# Patient Record
Sex: Female | Born: 1945
Health system: Southern US, Community
[De-identification: ages and names within clinical notes are randomized; demographics above are authoritative.]

## PROBLEM LIST (undated history)

## (undated) DIAGNOSIS — J189 Pneumonia, unspecified organism: Secondary | ICD-10-CM

## (undated) DIAGNOSIS — M542 Cervicalgia: Secondary | ICD-10-CM

## (undated) DIAGNOSIS — R140 Abdominal distension (gaseous): Secondary | ICD-10-CM

## (undated) DIAGNOSIS — IMO0001 Reserved for inherently not codable concepts without codable children: Secondary | ICD-10-CM

## (undated) DIAGNOSIS — Z170 Estrogen receptor positive status [ER+]: Secondary | ICD-10-CM

## (undated) DIAGNOSIS — M1990 Unspecified osteoarthritis, unspecified site: Secondary | ICD-10-CM

## (undated) DIAGNOSIS — E119 Type 2 diabetes mellitus without complications: Secondary | ICD-10-CM

## (undated) DIAGNOSIS — R519 Headache, unspecified: Secondary | ICD-10-CM

## (undated) DIAGNOSIS — K859 Acute pancreatitis without necrosis or infection, unspecified: Secondary | ICD-10-CM

## (undated) DIAGNOSIS — M545 Low back pain: Secondary | ICD-10-CM

## (undated) DIAGNOSIS — G8929 Other chronic pain: Secondary | ICD-10-CM

## (undated) DIAGNOSIS — C50912 Malignant neoplasm of unspecified site of left female breast: Secondary | ICD-10-CM

## (undated) DIAGNOSIS — Z4659 Encounter for fitting and adjustment of other gastrointestinal appliance and device: Secondary | ICD-10-CM

## (undated) DIAGNOSIS — K311 Adult hypertrophic pyloric stenosis: Secondary | ICD-10-CM

## (undated) DIAGNOSIS — J45909 Unspecified asthma, uncomplicated: Secondary | ICD-10-CM

## (undated) DIAGNOSIS — R51 Headache: Secondary | ICD-10-CM

## (undated) DIAGNOSIS — K219 Gastro-esophageal reflux disease without esophagitis: Secondary | ICD-10-CM

## (undated) DIAGNOSIS — I10 Essential (primary) hypertension: Secondary | ICD-10-CM

## (undated) DIAGNOSIS — C50919 Malignant neoplasm of unspecified site of unspecified female breast: Secondary | ICD-10-CM

## (undated) DIAGNOSIS — N179 Acute kidney failure, unspecified: Secondary | ICD-10-CM

## (undated) DIAGNOSIS — C762 Malignant neoplasm of abdomen: Secondary | ICD-10-CM

## (undated) DIAGNOSIS — M549 Dorsalgia, unspecified: Secondary | ICD-10-CM

## (undated) DIAGNOSIS — K315 Obstruction of duodenum: Secondary | ICD-10-CM

## (undated) DIAGNOSIS — J449 Chronic obstructive pulmonary disease, unspecified: Secondary | ICD-10-CM

## (undated) DIAGNOSIS — C50812 Malignant neoplasm of overlapping sites of left female breast: Secondary | ICD-10-CM

## (undated) DIAGNOSIS — D649 Anemia, unspecified: Secondary | ICD-10-CM

## (undated) HISTORY — PX: TUBAL LIGATION: SHX77

## (undated) HISTORY — DX: Malignant neoplasm of overlapping sites of left female breast: C50.812

## (undated) HISTORY — DX: Acute pancreatitis without necrosis or infection, unspecified: K85.90

## (undated) HISTORY — DX: Cervicalgia: M54.2

## (undated) HISTORY — DX: Type 2 diabetes mellitus without complications: E11.9

## (undated) HISTORY — DX: Other chronic pain: G89.29

## (undated) HISTORY — DX: Estrogen receptor positive status (ER+): Z17.0

## (undated) HISTORY — DX: Low back pain: M54.5

## (undated) HISTORY — DX: Acute kidney failure, unspecified: N17.9

## (undated) HISTORY — DX: Essential (primary) hypertension: I10

## (undated) HISTORY — DX: Gastro-esophageal reflux disease without esophagitis: K21.9

## (undated) HISTORY — DX: Encounter for fitting and adjustment of other gastrointestinal appliance and device: Z46.59

## (undated) HISTORY — DX: Obstruction of duodenum: K31.5

## (undated) HISTORY — DX: Abdominal distension (gaseous): R14.0

## (undated) HISTORY — DX: Dorsalgia, unspecified: M54.9

## (undated) HISTORY — DX: Malignant neoplasm of unspecified site of left female breast: C50.912

## (undated) HISTORY — DX: Adult hypertrophic pyloric stenosis: K31.1

## (undated) HISTORY — PX: ABDOMINAL HYSTERECTOMY: SHX81

---

## 2007-08-28 DIAGNOSIS — C50919 Malignant neoplasm of unspecified site of unspecified female breast: Secondary | ICD-10-CM

## 2007-08-28 HISTORY — DX: Malignant neoplasm of unspecified site of unspecified female breast: C50.919

## 2013-08-27 DIAGNOSIS — J189 Pneumonia, unspecified organism: Secondary | ICD-10-CM

## 2013-08-27 HISTORY — PX: BREAST SURGERY: SHX581

## 2013-08-27 HISTORY — DX: Pneumonia, unspecified organism: J18.9

## 2013-09-22 ENCOUNTER — Ambulatory Visit: Payer: Self-pay | Admitting: Family Medicine

## 2013-10-01 ENCOUNTER — Ambulatory Visit: Payer: Self-pay | Admitting: Family Medicine

## 2014-04-08 ENCOUNTER — Inpatient Hospital Stay: Payer: Self-pay | Admitting: Internal Medicine

## 2014-04-08 LAB — CBC
HCT: 49.9 % — AB (ref 35.0–47.0)
HGB: 15.8 g/dL (ref 12.0–16.0)
MCH: 28.8 pg (ref 26.0–34.0)
MCHC: 31.6 g/dL — ABNORMAL LOW (ref 32.0–36.0)
MCV: 91 fL (ref 80–100)
Platelet: 285 10*3/uL (ref 150–440)
RBC: 5.47 10*6/uL — ABNORMAL HIGH (ref 3.80–5.20)
RDW: 15.7 % — AB (ref 11.5–14.5)
WBC: 8.9 10*3/uL (ref 3.6–11.0)

## 2014-04-08 LAB — BASIC METABOLIC PANEL
Anion Gap: 6 — ABNORMAL LOW (ref 7–16)
BUN: 5 mg/dL — ABNORMAL LOW (ref 7–18)
CALCIUM: 9.5 mg/dL (ref 8.5–10.1)
CHLORIDE: 99 mmol/L (ref 98–107)
CO2: 28 mmol/L (ref 21–32)
Creatinine: 0.42 mg/dL — ABNORMAL LOW (ref 0.60–1.30)
EGFR (African American): >60
GLUCOSE: 116 mg/dL — AB (ref 65–99)
Osmolality: 265 (ref 275–301)
POTASSIUM: 4.2 mmol/L (ref 3.5–5.1)
Sodium: 133 mmol/L — ABNORMAL LOW (ref 136–145)

## 2014-04-08 LAB — TROPONIN I: Troponin-I: 0.05 ng/mL

## 2014-04-13 LAB — CULTURE, BLOOD (SINGLE)

## 2014-12-18 NOTE — H&P (Signed)
PATIENT NAME:  Sophia Nelson, Sophia Nelson MR#:  [id number] DATE OF BIRTH:  12-26-1945  DATE OF ADMISSION:  04/08/2014    PRIMARY CARE PHYSICIAN: Dr. Clide Deutscher.   CHIEF COMPLAINT: Shortness of breath.   HISTORY OF PRESENT ILLNESS: A 69 year old African-American female with a history of COPD and hypertension, diabetes, presenting with shortness of breath. Describes 1 day duration of shortness of breath with associated wheezing and chest tightness; however, denies any fevers, chills, cough, recent sick contacts with this and decided to present to the hospital for workup and evaluation, found to be hypoxemic on arrival in the ER requiring nonrebreather to maintain SaO2 greater than 92%. Currently, she is somewhat somnolent though arousable, but having some difficulty actually answering any questions.   REVIEW OF SYSTEMS: Essentially unobtainable at this point given patient's mental status and inability to cooperate with physical exam.  PAST MEDICAL HISTORY: COPD, hypertension, diabetes, history of breast cancer.   SOCIAL HISTORY: Positive for tobacco use. No alcohol or drug use.   FAMILY HISTORY: No known cardiovascular or pulmonary disorders.   ALLERGIES: No known drug allergies.   HOME MEDICATIONS: Aspirin 81 mg p.o. q. daily,  meloxicam 7.5 mg p.o. b.i.d., tramadol 50 mg p.o. q. 6 hours as needed for pain, lisinopril 10 mg 2 tabs p.o. daily, glipizide 5 mg p.o. q. daily, Advair 100/50 mcg 1 puff b.i.d., Spiriva 2.5 mcg inhalation 2 puffs daily, Ventolin 90 mcg inhalation 1 puff as needed for shortness of breath, montelukast 10 mg p.o. q. daily, Prilosec 20 mg p.o. q. daily and Flonase one spray inhaled as needed.   PHYSICAL EXAMINATION:  VITAL SIGNS: Temperature 97.9, heart rate 112, respirations 34 on arrival, currently 24. Blood pressure 199/93. Saturation is currently 174/82, saturating 88% on room air, currently 95 on supplemental O2. Weight 70.3 kg, BMI 28.4.  GENERAL: Well-nourished, well-developed,  African American female in minimal to moderate distress given mental status and respiratory status.  HEAD: Normocephalic, atraumatic.  EYES: Pupils equal, round and reactive to light. Extraocular muscles intact. No scleral icterus.  MOUTH: Moist mucous membranes. Dentition intact. No abscess noted.  EARS, NOSE AND THROAT: Clear without exudates. No external lesions.  NECK: Supple. No thyromegaly. No nodules. No JVD.  PULMONARY: Markedly decreased breath sounds at all lung fields with scant expiratory wheeze noted, however, no frank rales or rhonchi. Difficult examination given how diminished her breath sounds are. She is tachypneic, but no use of accessory muscles.  CHEST: Nontender to palpation.  CARDIOVASCULAR: S1, S2, tachycardic. No murmurs, rubs, or gallops. There is 1+ edema to the shins bilaterally. Pedal pulses 2+ bilaterally.  GASTROINTESTINAL: Soft, nontender, nondistended. No masses. Positive bowel sounds. No hepatosplenomegaly.  MUSCULOSKELETAL: No swelling, clubbing. Positive for edema as described below.  NEUROLOGIC: Difficult to assess given the patient's current mental status. She is arousable, but does not follow commands at this time which is different from the ER physician examination.  PSYCHIATRIC: She is somnolent. Will awake to verbal commands, however, falls back asleep. Will not follow simple commands at this time. Therefore, unable to fully assess given patient's mental status and medical condition.   LABORATORY DATA: Chest x-ray performed revealing bibasilar atelectasis. CT chest performed reveals no evidence of PE. Small area of air space consolidation and atelectasis in the posterior aspect of right lobe concerning for possible pneumonia as well as mediastinal and right hilar lymphadenopathy noted. Remainder of laboratory data: Sodium 133, potassium 4.2, chloride 99, bicarbonate 28, BUN 5, creatinine 0.42. WBC 8.9, hemoglobin 15.8, platelets  of 285,000. ABG results just  returned 7.376, 75, bicarbonate 37.4.  ASSESSMENT AND PLAN: A 69 year old [place] American female with history of chronic obstructive pulmonary disease, hypertension, diabetes currently having shortness of breath.   1. Acute on chronic respiratory failure with hypoxemia secondary to chronic obstructive pulmonary disease exacerbation indicated by respiratory rate and oxygen saturation on arrival. Provide DuoNeb treatments q. 4 hours, supplemental oxygen until saturations greater than 92%. Continue with Levaquin therapy as initiated in the Emergency Department for possible pneumonia as noted on CAT scan. Add incentive spirometry. Given the ABG results, it appears that she he is a chronic CO2 retainer although slightly worse now than usual. Given her mentation we will use BiPAP and follow her mentation. Placed on Solu-Medrol 60 mg intravenous q. daily as well.  2. Chronic obstructive pulmonary disease. Continue home medications as well as the additions as stated above.  3. Diabetes. Hold p.o. agents. Add insulin sliding scale with q. 6 hour Accu-Cheks.  4. Hypertension. Continue lisinopril. 5. Venous thromboembolism prophylaxis with heparin subcutaneous.  CODE:  Patient is a full code.   TIME SPENT: 45 minutes.    ____________________________ Aaron Mose. Hower, MD dkh:JT D: 04/08/2014 22:43:38 ET T: 04/08/2014 22:53:54 ET JOB#: [id number]  cc: Aaron Mose. Hower, MD, <Dictator> DAVID Woodfin Ganja MD ELECTRONICALLY SIGNED 04/09/2014 23:05

## 2014-12-18 NOTE — Discharge Summary (Signed)
PATIENT NAME:  Sophia Nelson, CARMICAL MR#:  [id number] DATE OF BIRTH:  1945/11/24  DATE OF ADMISSION:  04/08/2014 DATE OF DISCHARGE:    ADMISSION DIAGNOSIS: Acute respiratory failure.   DISCHARGE DIAGNOSES:  1. Acute on chronic respiratory failure from acute exacerbation of chronic obstructive pulmonary disease.  2. Acute exacerbation of chronic obstructive pulmonary disease.  3. Tobacco dependence.  4. History of hypertension. 5. History of diabetes.   CONSULTATIONS: None.   HOSPITAL COURSE: This is a 69 year old female who presented with shortness of breath, found to have acute exacerbation of COPD. For further details, please refer to the history and physical.   1. Acute on chronic respiratory failure from acute exacerbation of COPD, as outlined below.  2. Acute exacerbation of COPD, improved, doing well this morning. The patient was on IV steroids, Levaquin, inhalers and O2. She will need oxygen at discharge. She does not need a nebulizer at discharge, however. At discharge, her lungs are clear to auscultation. She has good air movement, but did desaturate to 87% on room air.  3. Tobacco dependence. The patient was counseled for 3 minutes regarding stopping smoking. She was encouraged every day to stop smoking. She will be discharged with a patch.  4. Hypertension. The patient was continued on lisinopril.  5. Diabetes. The patient's blood sugars were adequately controlled. She will continue outpatient medications.   DISCHARGE MEDICATIONS:  1. Glipizide 5 mg daily.  2. Aspirin 81 mg daily.  3. Spiriva 2 puffs daily.  4. Omeprazole 20 mg daily.  5. Tramadol 50 mg daily p.r.n.  6. Meloxicam 7.5 b.i.d.  7. Montelukast 10 mg in the morning.  8. Ventolin 1-2 puffs BID as needed.  9. Advair Diskus 100/50 b.i.d.  10. Lisinopril 20 mg daily.  11. Prednisone taper starting at 60 mg tapering by 10 mg every 3 days.  12. Levaquin 500 mg daily for 5 days.  13. Nicotine patch 21 mg per 24 hours.    DISCHARGE DIET: Low sodium diet.   DISCHARGE ACTIVITY: As tolerated.  DISCHARGE PLAN: Discharge home with home health, physical therapy, nurse and nurse aide for assessment and gait.   DISCHARGE OXYGEN: 2 liters nasal cannula.   DISCHARGE FOLLOW-UP: The patient will follow up with Dr. Clide Deutscher in 1 week.   TIME SPENT: 35 minutes.   The patient was stable for discharge.     ____________________________ Lankford Gutzmer P. Benjie Karvonen, MD spm:JT D: 04/11/2014 11:38:02 ET T: 04/11/2014 13:21:15 ET JOB#: [id number]  cc: Dariel Pellecchia P. Benjie Karvonen, MD, <Dictator> Donell Beers Deisi Salonga MD ELECTRONICALLY SIGNED 04/11/2014 21:32

## 2015-06-29 ENCOUNTER — Other Ambulatory Visit: Payer: Self-pay | Admitting: Family Medicine

## 2015-06-29 DIAGNOSIS — Z780 Asymptomatic menopausal state: Secondary | ICD-10-CM

## 2015-06-29 DIAGNOSIS — Z1231 Encounter for screening mammogram for malignant neoplasm of breast: Secondary | ICD-10-CM

## 2015-07-12 ENCOUNTER — Ambulatory Visit
Admission: RE | Admit: 2015-07-12 | Discharge: 2015-07-12 | Disposition: A | Discharge status: Home / Self Care | Payer: Medicare HMO | Source: Ambulatory Visit | Attending: Family Medicine | Admitting: Family Medicine

## 2015-07-12 ENCOUNTER — Other Ambulatory Visit: Payer: Self-pay | Admitting: Family Medicine

## 2015-07-12 DIAGNOSIS — N6459 Other signs and symptoms in breast: Secondary | ICD-10-CM

## 2015-07-12 DIAGNOSIS — Z780 Asymptomatic menopausal state: Secondary | ICD-10-CM | POA: Diagnosis present

## 2015-07-12 DIAGNOSIS — M8580 Other specified disorders of bone density and structure, unspecified site: Secondary | ICD-10-CM | POA: Insufficient documentation

## 2015-07-12 DIAGNOSIS — Z1231 Encounter for screening mammogram for malignant neoplasm of breast: Secondary | ICD-10-CM

## 2015-07-12 HISTORY — DX: Malignant neoplasm of unspecified site of unspecified female breast: C50.919

## 2015-07-28 ENCOUNTER — Encounter: Payer: Self-pay | Admitting: *Deleted

## 2015-08-03 ENCOUNTER — Other Ambulatory Visit: Payer: Medicare HMO

## 2015-08-03 ENCOUNTER — Encounter: Payer: Self-pay | Admitting: General Surgery

## 2015-08-03 ENCOUNTER — Ambulatory Visit (INDEPENDENT_AMBULATORY_CARE_PROVIDER_SITE_OTHER): Payer: Medicare HMO | Admitting: General Surgery

## 2015-08-03 VITALS — BP 138/76 | HR 95 | Resp 16 | Ht 63.0 in | Wt 168.0 lb

## 2015-08-03 DIAGNOSIS — N63 Unspecified lump in breast: Secondary | ICD-10-CM | POA: Diagnosis not present

## 2015-08-03 DIAGNOSIS — N6320 Unspecified lump in the left breast, unspecified quadrant: Secondary | ICD-10-CM

## 2015-08-03 NOTE — Progress Notes (Signed)
Patient ID: Sophia Nelson, female   DOB: [birth year], 69 y.o.   MRN: [id number]  Chief Complaint  Patient presents with  . Other    mammogram    HPI Sophia Nelson is a 69 y.o. female who presents for a breast evaluation. The most recent mammogram was done on 07/12/15.  Patient does perform regular self breast checks and gets regular mammograms done.Patient had a right mastectomy in 2010 at [facility] . She states the lymph nodes were not involved.   Patient states she was never on a hormone medication. No chemotherapy. For the last 1 yr she has noted left breast becoming more and more firm and hard. I have reviewed the history of present illness with the patient.  HPI  Past Medical History  Diagnosis Date  . Hypertension   . Diabetes mellitus without complication ([name])   . GERD (gastroesophageal reflux disease)   . Breast cancer ([place]) 2009    RT MASTECTOMY    Past Surgical History  Procedure Laterality Date  . Tubal ligation    . Breast surgery Right 2015    mastectomy  . Abdominal hysterectomy      Family History  Problem Relation Age of Onset  . Breast cancer Neg Hx     Social History Social History  Substance Use Topics  . Smoking status: Current Every Day Smoker -- 1.00 packs/day for 15 years    Types: Cigarettes  . Smokeless tobacco: None  . Alcohol Use: No    No Known Allergies  Current Outpatient Prescriptions  Medication Sig Dispense Refill  . ADVAIR DISKUS 500-50 MCG/DOSE AEPB     . CHANTIX 1 MG tablet Take 1 mg by mouth daily.     . cyclobenzaprine (FLEXERIL) 10 MG tablet Take 10 mg by mouth 2 (two) times daily.     [name] GLIPIZIDE XL 2.5 MG 24 hr tablet Take 2.5 mg by mouth daily with breakfast.     . hydrochlorothiazide (MICROZIDE) 12.5 MG capsule Take 12.5 mg by mouth daily.     [name] lisinopril (PRINIVIL,ZESTRIL) 40 MG tablet Take 40 mg by mouth daily.     . meloxicam (MOBIC) 7.5 MG tablet Take 7.5 mg by mouth daily.     [name] omeprazole (PRILOSEC) 20 MG capsule  Take 20 mg by mouth daily.     . OXYGEN Inhale into the lungs.    . SPIRIVA HANDIHALER 18 MCG inhalation capsule Place 18 mcg into inhaler and inhale.      No current facility-administered medications for this visit.    Review of Systems Review of Systems  Constitutional: Negative.   Respiratory: Negative.   Cardiovascular: Negative.     Blood pressure 138/76, pulse 95, resp. rate 16, height 5\' 3"  (1.6 m), weight 168 lb (76.204 kg), SpO2 92 %.  Physical Exam Physical Exam  Constitutional: She is oriented to person, place, and time. She appears well-developed and well-nourished.  Eyes: Conjunctivae are normal. No scleral icterus.  Neck: Neck supple.  Cardiovascular: Normal rate, regular rhythm and normal heart sounds.   Pulmonary/Chest: Effort normal and breath sounds normal. Left breast exhibits mass and skin change. Left breast exhibits no inverted nipple, no nipple discharge and no tenderness.  Right mastectomy site looks clean and well healed.   Entire left breast is one hard mass. Overlying skin is tethered. No signs of redness.No apparent fixity to underlying muscle.  Lymphadenopathy:    She has no cervical adenopathy.    She has axillary adenopathy.  Few palpable nodes in left axilla  Neurological: She is alert and oriented to person, place, and time.  Skin: Skin is warm and dry.    Data Reviewed Notes and mammogram reviewed Via care everywhere [facility] notes reviewed. 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 Hard to tell from notes if pt was treated with antihormonal therapy Mammogram shows skin thickening, contraction of breast compared to prior. [place] was done showing 2-3 suspicious nodes in left axilla, Areas of shadowing in left breast, no defined mass. Assessment    Likely CA left breast, possible skin and axillary node involvemnt involvement. History of DCIS right breast    Plan    Discussed findings with pt and core biopsy of breast and  lymph node  with punch skin biopsy recommended. Pt was reluctant but did agree to have  the breast biopsy today. Completed Await path report       Renise Gillies G 08/05/2015, 9:46 AM

## 2015-08-03 NOTE — Patient Instructions (Signed)

## 2015-08-05 ENCOUNTER — Ambulatory Visit (INDEPENDENT_AMBULATORY_CARE_PROVIDER_SITE_OTHER): Payer: Medicare HMO | Admitting: *Deleted

## 2015-08-05 ENCOUNTER — Encounter: Payer: Self-pay | Admitting: General Surgery

## 2015-08-05 VITALS — BP 142/68 | HR 74 | Resp 18 | Ht 63.0 in | Wt 168.0 lb

## 2015-08-05 DIAGNOSIS — C50912 Malignant neoplasm of unspecified site of left female breast: Secondary | ICD-10-CM

## 2015-08-05 NOTE — Progress Notes (Signed)
This is a 69 year old female here today for her two day follow up left breast biopsy. No complaints I have reviewed the history of present illness with the patient.    Dressing removed. Biopsy site is clean. Mild ecchymosis noted. Pathology- invasive lobular cancer grade 1. ER/PR, Her 2 pending. Skin biopsy also pending. Pt was advised on this in full. It appears likely she has involved nodes.  Discussed Potential for distant disease and hence a PET scan was recommended. She is agreeable.   Patient is scheduled for a PET scan at [facility] on 08/10/15 at 10:30 am. She is to have nothing to eat or drink after midnight. She will hold all medications that morning and will bring them with her. She is to arrive by 10 am. Patient is aware of date, time, and instructions.

## 2015-08-05 NOTE — Patient Instructions (Signed)
Patient is scheduled for a PET scan at [facility] on 08/10/15 at 10:30 am. She is to have nothing to eat or drink after midnight. She will hold all medications that morning and will bring them with her. She is to arrive by 10 am. Patient is aware of date, time, and instructions.

## 2015-08-08 ENCOUNTER — Telehealth: Payer: Self-pay | Admitting: *Deleted

## 2015-08-08 ENCOUNTER — Other Ambulatory Visit: Payer: Self-pay | Admitting: *Deleted

## 2015-08-08 DIAGNOSIS — C50912 Malignant neoplasm of unspecified site of left female breast: Secondary | ICD-10-CM

## 2015-08-08 NOTE — Progress Notes (Unsigned)
Colette at the [facility] aware of referral. She will notify our office with an appointment date once scheduled.

## 2015-08-08 NOTE — Telephone Encounter (Signed)
Patient has been scheduled for an appointment with Dr. Grayland Ormond at the [facility] for 08-09-15 at 10 am. This patient is aware of date, time, and instructions.

## 2015-08-09 ENCOUNTER — Encounter: Payer: Self-pay | Admitting: *Deleted

## 2015-08-09 ENCOUNTER — Encounter: Payer: Self-pay | Admitting: Oncology

## 2015-08-09 ENCOUNTER — Inpatient Hospital Stay: Payer: Medicare HMO | Attending: Oncology | Admitting: Oncology

## 2015-08-09 ENCOUNTER — Ambulatory Visit: Payer: Medicare HMO | Admitting: Oncology

## 2015-08-09 VITALS — BP 142/78 | HR 103 | Temp 97.3°F | Wt 169.3 lb

## 2015-08-09 DIAGNOSIS — R590 Localized enlarged lymph nodes: Secondary | ICD-10-CM | POA: Diagnosis not present

## 2015-08-09 DIAGNOSIS — Z9981 Dependence on supplemental oxygen: Secondary | ICD-10-CM | POA: Diagnosis not present

## 2015-08-09 DIAGNOSIS — Z9011 Acquired absence of right breast and nipple: Secondary | ICD-10-CM

## 2015-08-09 DIAGNOSIS — L539 Erythematous condition, unspecified: Secondary | ICD-10-CM | POA: Insufficient documentation

## 2015-08-09 DIAGNOSIS — I10 Essential (primary) hypertension: Secondary | ICD-10-CM | POA: Diagnosis not present

## 2015-08-09 DIAGNOSIS — J449 Chronic obstructive pulmonary disease, unspecified: Secondary | ICD-10-CM | POA: Insufficient documentation

## 2015-08-09 DIAGNOSIS — C50912 Malignant neoplasm of unspecified site of left female breast: Secondary | ICD-10-CM | POA: Diagnosis not present

## 2015-08-09 DIAGNOSIS — K219 Gastro-esophageal reflux disease without esophagitis: Secondary | ICD-10-CM | POA: Insufficient documentation

## 2015-08-09 DIAGNOSIS — Z79811 Long term (current) use of aromatase inhibitors: Secondary | ICD-10-CM | POA: Diagnosis not present

## 2015-08-09 DIAGNOSIS — N1330 Unspecified hydronephrosis: Secondary | ICD-10-CM | POA: Insufficient documentation

## 2015-08-09 DIAGNOSIS — I2510 Atherosclerotic heart disease of native coronary artery without angina pectoris: Secondary | ICD-10-CM | POA: Diagnosis not present

## 2015-08-09 DIAGNOSIS — E119 Type 2 diabetes mellitus without complications: Secondary | ICD-10-CM | POA: Diagnosis not present

## 2015-08-09 DIAGNOSIS — Z853 Personal history of malignant neoplasm of breast: Secondary | ICD-10-CM | POA: Insufficient documentation

## 2015-08-09 DIAGNOSIS — Z79899 Other long term (current) drug therapy: Secondary | ICD-10-CM | POA: Insufficient documentation

## 2015-08-09 DIAGNOSIS — F17210 Nicotine dependence, cigarettes, uncomplicated: Secondary | ICD-10-CM | POA: Insufficient documentation

## 2015-08-09 DIAGNOSIS — Z7982 Long term (current) use of aspirin: Secondary | ICD-10-CM | POA: Insufficient documentation

## 2015-08-09 HISTORY — DX: Malignant neoplasm of unspecified site of left female breast: C50.912

## 2015-08-09 NOTE — Progress Notes (Signed)
Oncology Nurse Navigator Documentation  Oncology Nurse Navigator Flowsheets 08/09/2015  Referral date to RadOnc/MedOnc 08/09/2015  Navigator Encounter Type Initial MedOnc  Patient Visit Type Medonc  Treatment Phase Other  Barriers/Navigation Needs Education  Education Newly Diagnosed Cancer Education  Time Spent with Patient 30    Met with patient today during her initial medical oncology visit.  Offered support.  Gave patient breast cancer educational literature, "My Breast Cancer Treatment Handbook" by Josephine Igo, RN.  Offered assistance with prosthesis after complete treatment plan is made.  She is agreeable.  She is to call if she has any questions or needs.

## 2015-08-09 NOTE — Progress Notes (Signed)
[place] @ [facility] [phone]  Fax:([phone]) [place]: 1946-06-23  MR#: [id number]  NTI#:[id number]  Patient Care Team: Donnie Coffin, MD as PCP - General (Family Medicine) Donnie Coffin, MD as Referring Physician (Family Medicine) Seeplaputhur Robinette Haines, MD (General Surgery)  CHIEF COMPLAINT:  Chief Complaint  Patient presents with  . 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  Previous history of carcinoma of right breast status post mastectomy was done at [facility] exact staging not known Cording to patient she did not have any follow-up treatment  VISIT DIAGNOSIS:  Cancer of left breast  No flowsheet data found.  INTERVAL HISTORY: 69 year old lady who has a previous history of non-insulin-dependent diabetes.  And hypertension.  Patient had a very poor follow-up regarding her right breast carcinoma for which patient had mastectomy done at [facility].  Patient does not report to have any mammogram except for one mammogram 2 years ago when patient was told that has not in duration and needs to be evaluated.  Recently patient had a mammogram done and was referred to surgical service but biopsy was consistent with lobular cancer there were palpable lymph node in the left axilla.  Skin biopsy was negative.  Estrogen progesterone receptor and HER-2/neu receptor pending at present time  Patient has a PET scan scheduled tomorrow. Previous history of diabetes.  Hypertension.  Patient is a chronic smoker and has been on oxygen for a almost last one year.  Patient continues to smoke REVIEW OF SYSTEMS:   GENERAL: Patient is not in any acute distress PERFORMANCE STATUS (ECOG): 01 HEENT:  No visual changes, runny nose, sore throat, mouth sores or tenderness. Lungs: Has a history of COPD.  Using inhaler.  On  oxygen. Cardiac:  No chest pain, palpitations, orthopnea, or PND. GI:  No nausea, vomiting, diarrhea, constipation, melena or hematochezia. GU:  No urgency, frequency, dysuria, or hematuria. Musculoskeletal:  Patient started having increasing back pain for last year or so has been on meloxicam and muscle relaxant. Extremities:  No pain or swelling. Skin:  No rashes or skin changes. Neuro:  No headache, numbness or weakness, balance or coordination issues. Endocrine:  No diabetes, thyroid issues, hot flashes or night sweats. Psych:  No mood changes, depression or anxiety. Pain:  No focal pain. Review of systems:  All other systems reviewed and found to be negative.  As per HPI. Otherwise, a complete review of systems is negatve.  PAST MEDICAL HISTORY: Past Medical History  Diagnosis Date  . Hypertension   . Diabetes mellitus without complication ([place])   . GERD (gastroesophageal reflux disease)   . Breast cancer ([place]) 2009    RT MASTECTOMY    PAST SURGICAL HISTORY: Past Surgical History  Procedure Laterality Date  . Tubal ligation    . Breast surgery Right 2015    mastectomy  . Abdominal hysterectomy      FAMILY HISTORY Family History  Problem Relation Age of Onset  . Breast cancer Neg Hx          ADVANCED DIRECTIVES:  Patient does not have any living will or healthcare power of attorney.  Information was given .  Available resources had been discussed.  We will follow-up on subsequent appointments regarding this issue  HEALTH MAINTENANCE: Social History  Substance Use Topics  . Smoking status: Current  Every Day Smoker -- 1.00 packs/day for 15 years    Types: Cigarettes  . Smokeless tobacco: None  . Alcohol Use: No   No Known Allergies  Current Outpatient Prescriptions  Medication Sig Dispense Refill  . ADVAIR DISKUS 500-50 MCG/DOSE AEPB     . aspirin 81 MG tablet Take 81 mg by mouth daily.    . calcium-vitamin D 250-100 MG-UNIT tablet Take 1 tablet by mouth  2 (two) times daily.    . CHANTIX 1 MG tablet Take 1 mg by mouth daily.     . cyclobenzaprine (FLEXERIL) 10 MG tablet Take 10 mg by mouth 2 (two) times daily.     . fluticasone (FLONASE) 50 MCG/ACT nasal spray Place 2 sprays into both nostrils daily.    [name] GLIPIZIDE XL 2.5 MG 24 hr tablet Take 2.5 mg by mouth daily with breakfast.     . hydrochlorothiazide (MICROZIDE) 12.5 MG capsule Take 12.5 mg by mouth daily.     [name] lisinopril (PRINIVIL,ZESTRIL) 40 MG tablet Take 40 mg by mouth daily.     . meloxicam (MOBIC) 7.5 MG tablet Take 7.5 mg by mouth daily.     . montelukast (SINGULAIR) 10 MG tablet Take 10 mg by mouth at bedtime.    [name] omeprazole (PRILOSEC) 20 MG capsule Take 20 mg by mouth daily.     . OXYGEN Inhale into the lungs.    . SPIRIVA HANDIHALER 18 MCG inhalation capsule Place 18 mcg into inhaler and inhale.      No current facility-administered medications for this visit.    OBJECTIVE: PHYSICAL EXAM: Patient is alert oriented not in any acute distress.  Head exam was generally normal. There was no scleral icterus or corneal arcus. Mucous membranes were moist. Examination of neck: No palpable lymphadenopathy.  Thyroid within normal limit Chest: Emphysematous chest.  Patient is on oxygen.  Coarse crepitation. Cardiac exam revealed the PMI to be normally situated and sized. The rhythm was regular and no extrasystoles were noted during several minutes of auscultation. The first and second heart sounds were normal and physiologic splitting of the second heart sound was noted. There were no murmurs, rubs, clicks, or gallops. Abdominal exam revealed normal bowel sounds. The abdomen was soft, non-tender, and without masses, organomegaly, or appreciable enlargement of the abdominal aorta. Musculoskeletal system tenderness in mid back area.  A no other abnormality detected Examination of the skin revealed no evidence of significant rashes, suspicious appearing nevi or other concerning  lesions. Neurologically, the patient was awake, alert, and oriented to person, place and time. There were no obvious focal neurologic abnormalities.  Right breast: Status post mastectomy no evidence of recurrent disease.  Left breast hard mass.  Palpable mass almost involving the whole breast.  Skin shows some redness.  Palpable left axillary lymphadenopathy  Filed Vitals:   08/09/15 1141  BP: 142/78  Pulse: 103  Temp: 97.3 F (36.3 C)     Body mass index is 30 kg/(m^2).    ECOG FS:1 - Symptomatic but completely ambulatory  LAB RESULTS:  No visits with results within 5 Day(s) from this visit. Latest known visit with results is:  [facility] Conversion on 04/08/2014  Component Date Value Ref Range Status  . WBC 04/08/2014 8.9  3.6-11.0 x10 3/mm 3 Final  . RBC 04/08/2014 5.47* 3.80-5.20 X10 6/mm 3 Final  . HGB 04/08/2014 15.8  12.0-16.0 g/dL Final  . HCT 04/08/2014 49.9* 35.0-47.0 % Final  . MCV 04/08/2014 91  80-100 fL Final  .  MCH 04/08/2014 28.8  26.0-34.0 pg Final  . MCHC 04/08/2014 31.6* 32.0-36.0 g/dL Final  . RDW 04/08/2014 15.7* 11.5-14.5 % Final  . Platelet 04/08/2014 285  150-440 x10 3/mm 3 Final  . Troponin-I 04/08/2014 0.05   Final   Comment: 0.00-0.05 0.05 ng/mL or less: NEGATIVE  Repeat testing in 3-6 hrs  if clinically indicated. >0.05 ng/mL: POTENTIAL  MYOCARDIAL INJURY. Repeat  testing in 3-6 hrs if  clinically indicated. NOTE: An increase or decrease  of 30% or more on serial  testing suggests a  clinically important change   . Glucose 04/08/2014 116* 65-99 mg/dL Final  . BUN 04/08/2014 5* 7-18 mg/dL Final  . Creatinine 04/08/2014 0.42* 0.60-1.30 mg/dL Final  . Sodium 04/08/2014 133* 136-145 mmol/L Final  . Potassium 04/08/2014 4.2  3.5-5.1 mmol/L Final  . Chloride 04/08/2014 99  98-107 mmol/L Final  . Co2 04/08/2014 28  21-32 mmol/L Final  . Calcium, Total 04/08/2014 9.5  8.5-10.1 mg/dL Final  . Osmolality 04/08/2014 265  275-301 Final  . Anion Gap  04/08/2014 6* 7-16 Final  . EGFR (African American) 04/08/2014 >60   Final  . EGFR (Non-African Amer.) 04/08/2014 >60   Final   Comment: eGFR values <17m/min/1.73 m2 may be an indication of chronic kidney disease (CKD). Calculated eGFR is useful in patients with stable renal function. The eGFR calculation will not be reliable in acutely ill patients when serum creatinine is changing rapidly. It is not useful in  patients on dialysis. The eGFR calculation may not be applicable to patients at the low and high extremes of body sizes, pregnant women, and vegetarians.   . Micro Text Report 04/08/2014    Final                   Value:   COMMENT                   NO GROWTH AEROBICALLY/ANAEROBICALLY IN 5 DAYS   ANTIBIOTIC                                                      . Micro Text Report 04/08/2014    Final                   Value:   COMMENT                   NO GROWTH AEROBICALLY/ANAEROBICALLY IN 5 DAYS   ANTIBIOTIC                                                         STUDIES: Dg Bone Density  07/12/2015  EXAM: DUAL X-RAY ABSORPTIOMETRY (DXA) FOR BONE MINERAL DENSITY IMPRESSION: Dear Dr. AClide Deutscher Your patient EVelencia Lenartcompleted a BMD test on 07/12/2015 using the [place](analysis version: 14.10) manufactured by [employer] The following summarizes the results of our evaluation. PATIENT BIOGRAPHICAL: Name: JDru, LaurelPatient ID: [id number] Date: 0July 20, 1947Height: 63.0 in. Gender: Female Exam Date: 07/12/2015 Weight: 167.1 lbs. Indications: asthma, Breast CA, COPD, DIABETES, History of Fracture (Adult), Hysterectomy, Postmenopausal, Tobacco User(Current Smoker), Tobacco User (Current Smoker) Fractures: Right  foot, Right forearm Treatments: Advair Inhaler, Albuterol, ASPRIN 81 MG, Flonase, omeprazole, spiriva ASSESSMENT: The BMD measured at Femur Neck Right is 0.869 g/cm2 with a T-score of -1.2. This patient is considered osteopenic according to [facility]) criteria. L-3 was excluded due to degenerative changes. Site Region Measured Measured WHO Young Adult BMD Date       Age      Classification T-score AP Spine L1-L4 (L3) 9/12/[age] 67.3 Normal 0.3 1.219 g/cm2 DualFemur Neck Right 07/12/2015 67.3 Osteopenia -1.2 0.869 g/cm2 World Health Organization [facility]) criteria for post-menopausal, Caucasian Women: Normal:       T-score at or above -1 SD Osteopenia:   T-score between -1 and -2.5 SD Osteoporosis: T-score at or below -2.5 SD RECOMMENDATIONS: [place] recommends that FDA-approved medical therapies be considered in postmenopausal women and men age 56 or older with a: 1. Hip or vertebral (clinical or morphometric) fracture. 2. T-score of < -2.5 at the spine or hip. 3. Ten-year fracture probability by FRAX of 3% or greater for hip fracture or 20% or greater for major osteoporotic fracture. All treatment decisions require clinical judgment and consideration of individual patient factors, including patient preferences, co-morbidities, previous drug use, risk factors not captured in the FRAX model (e.g. falls, vitamin D deficiency, increased bone turnover, interval significant decline in bone density) and possible under - or over-estimation of fracture risk by FRAX. All patients should ensure an adequate intake of dietary calcium (1200 mg/d) and vitamin D (800 IU daily) unless contraindicated. FOLLOW-UP: People with diagnosed cases of osteoporosis or at high risk for fracture should have regular bone mineral density tests. For patients eligible for Medicare, routine testing is allowed once every 2 years. The testing frequency can be increased to one year for patients who have rapidly progressing disease, those who are receiving or discontinuing medical therapy to restore bone mass, or have additional risk factors. I have reviewed this report, and agree with the above findings. [facility] Radiology Dear Dr. Clide Deutscher, Your  patient ZAYLI VILLAFUERTE completed a FRAX assessment on 07/12/2015 using the [place] (analysis version: 14.10) manufactured by [employer]. The following summarizes the results of our evaluation. PATIENT BIOGRAPHICAL: Name: Gaylene, Moylan Patient ID: [id number] Birth Date: [birth year] Height:    63.0 in. Gender:     Female    [age]       Weight:    167.1 lbs. Ethnicity:  Black                            Exam Date: 07/12/2015 FRAX* RESULTS:  (version: 3.5) 10-year Probability of Fracture1 Major Osteoporotic Fracture2 Hip Fracture 6.3% 1.0% Population: [place] (Black) Risk Factors: History of Fracture (Adult), Tobacco User (Current Smoker) Based on Femur (Right) Neck BMD 1 -The 10-year probability of fracture may be lower than reported if the patient has received treatment. 2 -Major Osteoporotic Fracture: Clinical Spine, Forearm, Hip or Shoulder *FRAX is a Materials engineer of the [employer] for Metabolic Bone Disease, a [place] (WHO) [facility]. ASSESSMENT: The probability of a major osteoporotic fracture is 6.3% within the next ten years. The probability of a hip fracture is 1.0% within the next ten years. . Electronically Signed   By: David  [place] M.D.   On: 07/12/2015 11:46   US Breast Ltd Uni Left Inc Axilla  07/12/2015  CLINICAL DATA:  Patient has had progressive hardening of her left breast with skin thickening as well as discomfort. Patient has a history of right mastectomy for breast carcinoma 2010. EXAM: DIGITAL DIAGNOSTIC LEFT MAMMOGRAM WITH 3D TOMOSYNTHESIS WITH CAD ULTRASOUND LEFT BREAST COMPARISON:  Previous exam(s). ACR Breast Density Category b: There are scattered areas of fibroglandular density. FINDINGS: When compared to prior studies, the overall size of the left breast has progressively decreased. The skin is thickened particularly the periareolar and medial- inferior breast skin. The suspensory ligaments have  increased in thickness. There also multiple prominent lymph nodes in the left axilla. There is no discrete mammographic mass. Architecture is irregular without a discrete area of distortion converging to a specific point. Mammographic images were processed with CAD. On physical exam, the breast is hard with the skin being thickened and irregular. The firmest areas are along the inferior medial aspect near the nipple. No discrete palpable mass. Targeted ultrasound is performed, showing areas of some diffuse irregular shadowing, but no discrete mass. No specific target is seen for biopsy within the left breast. However, there are abnormal axillary lymph nodes. Several nodes are mildly enlarged with significant cortical thickening. One measured node measures 1.2 cm in short axis with a 6 mm cortex. IMPRESSION: Although no discrete breast mass is identified mammographically or on ultrasound, mammographic change with a decrease in breast size and thickening diffusely of the suspensory ligaments, irregularity of the architecture and overlying skin thickening, along with the clinical findings of a smaller firm breast, as well as the ultrasound findings of abnormal left axillary lymph nodes, are all suspicious for inflammatory breast carcinoma. Biopsy is indicated. RECOMMENDATION: Surgical consultation and biopsy. Punch biopsy of the skin is recommended. Random biopsy of the firmer areas of the breast parenchyma should also be considered. The axillary lymph nodes are amenable to ultrasound-guided core needle biopsy. I have discussed the findings and recommendations with the patient. Results were also provided in writing at the conclusion of the visit. If applicable, a reminder letter will be sent to the patient regarding the next appointment. BI-RADS CATEGORY  4: Suspicious. Electronically Signed   By: Lajean Manes M.D.   On: 07/12/2015 12:17   Mm Diag Breast Tomo Uni Left  07/12/2015  CLINICAL DATA:  Patient has had  progressive hardening of her left breast with skin thickening as well as discomfort. Patient has a history of right mastectomy for breast carcinoma 2010. EXAM: DIGITAL DIAGNOSTIC LEFT MAMMOGRAM WITH 3D TOMOSYNTHESIS WITH CAD ULTRASOUND LEFT BREAST COMPARISON:  Previous exam(s). ACR Breast Density Category b: There are scattered areas of fibroglandular density. FINDINGS: When compared to prior studies, the overall size of the left breast has progressively decreased. The skin is thickened particularly the periareolar and medial- inferior breast skin. The suspensory ligaments have increased in thickness. There also multiple prominent lymph nodes in the left axilla. There is no discrete mammographic mass. Architecture is irregular without a discrete area of distortion converging to a specific point. Mammographic images were processed with CAD. On physical exam, the breast is hard with the skin being thickened and irregular. The firmest areas are along the inferior medial aspect near the nipple. No discrete palpable mass. Targeted ultrasound is performed, showing areas of some diffuse irregular shadowing, but no discrete mass. No specific target is seen for biopsy within the left breast. However, there are abnormal axillary lymph nodes. Several nodes are mildly enlarged with significant cortical thickening. One measured node measures 1.2 cm in short axis with a 6  mm cortex. IMPRESSION: Although no discrete breast mass is identified mammographically or on ultrasound, mammographic change with a decrease in breast size and thickening diffusely of the suspensory ligaments, irregularity of the architecture and overlying skin thickening, along with the clinical findings of a smaller firm breast, as well as the ultrasound findings of abnormal left axillary lymph nodes, are all suspicious for inflammatory breast carcinoma. Biopsy is indicated. RECOMMENDATION: Surgical consultation and biopsy. Punch biopsy of the skin is  recommended. Random biopsy of the firmer areas of the breast parenchyma should also be considered. The axillary lymph nodes are amenable to ultrasound-guided core needle biopsy. I have discussed the findings and recommendations with the patient. Results were also provided in writing at the conclusion of the visit. If applicable, a reminder letter will be sent to the patient regarding the next appointment. BI-RADS CATEGORY  4: Suspicious. Electronically Signed   By: Lajean Manes M.D.   On: 07/12/2015 12:17    ASSESSMENT: Locally advanced carcinoma of breast (left) Biopsies consistent with lobular carcinoma.  Skin biopsies negative.  Hormone receptor and HER-2/neu receptor pending. Patient is palpable lymphadenopathy Further staging workup with a PET scan is pending  2.  Previous history of carcinoma of right breast status post mastectomy done at [facility] in 2009 Sex stage was not known that was no follow-up mammogram or anti-hormonal therapy  Comorbid conditions include COPD.  Continuing smoking.  Non-insulin-dependent diabetes. Hypertension.  PLAN: I had prolonged discussion with patient also discussed situation with Dr. Jamal Collin , surgeon attending the patient We will get the lab data done for complete evaluation Severe back pain that patient has is bothersome previous bone density shows osteoporosis I will review PET scan and depending on the results and further planning of treatment which may include possibility of anti-hormonal therapy (on adjuvant or neoadjuvant basis depending on the estrogen progesterone receptor result) possibility of neoadjuvant chemotherapy or surgical intervention will be discussed.  Case will be discussed in tumor conference. Initial surgery may require extensive skin grafting Pathology report has been reviewed.  Ultrasound and mammogram has been reviewed independently.  Patient expressed understanding and was in agreement with this plan. She also understands  that She can call clinic at any time with any questions, concerns, or complaints.    No matching staging information was found for the patient.  Forest Gleason, MD   08/09/2015 11:46 AM

## 2015-08-09 NOTE — Progress Notes (Signed)
Patient here as new evaluation for breast referred by Dr. Jamal Collin.

## 2015-08-10 ENCOUNTER — Ambulatory Visit
Admission: RE | Admit: 2015-08-10 | Discharge: 2015-08-10 | Disposition: A | Discharge status: Home / Self Care | Payer: Medicare HMO | Source: Ambulatory Visit | Attending: General Surgery | Admitting: General Surgery

## 2015-08-10 DIAGNOSIS — C50912 Malignant neoplasm of unspecified site of left female breast: Secondary | ICD-10-CM | POA: Diagnosis present

## 2015-08-10 DIAGNOSIS — Z9011 Acquired absence of right breast and nipple: Secondary | ICD-10-CM | POA: Insufficient documentation

## 2015-08-10 DIAGNOSIS — I2510 Atherosclerotic heart disease of native coronary artery without angina pectoris: Secondary | ICD-10-CM | POA: Diagnosis not present

## 2015-08-10 DIAGNOSIS — R590 Localized enlarged lymph nodes: Secondary | ICD-10-CM | POA: Insufficient documentation

## 2015-08-10 DIAGNOSIS — E279 Disorder of adrenal gland, unspecified: Secondary | ICD-10-CM | POA: Insufficient documentation

## 2015-08-10 DIAGNOSIS — M899 Disorder of bone, unspecified: Secondary | ICD-10-CM | POA: Insufficient documentation

## 2015-08-10 LAB — GLUCOSE, CAPILLARY: GLUCOSE-CAPILLARY: 97 mg/dL (ref 65–99)

## 2015-08-10 MED ORDER — FLUDEOXYGLUCOSE F - 18 (FDG) INJECTION
12.7500 | Freq: Once | INTRAVENOUS | Status: AC | PRN
  Administered 2015-08-10: 12.75 via INTRAVENOUS

## 2015-08-11 NOTE — Progress Notes (Signed)
Patient aware of her appointment with Dr. Oliva Bustard on Monday, 08-15-15 at 2:30 pm. She verbalizes understanding.

## 2015-08-15 ENCOUNTER — Inpatient Hospital Stay (HOSPITAL_BASED_OUTPATIENT_CLINIC_OR_DEPARTMENT_OTHER): Payer: Medicare HMO | Admitting: Oncology

## 2015-08-15 ENCOUNTER — Encounter: Payer: Self-pay | Admitting: Oncology

## 2015-08-15 ENCOUNTER — Inpatient Hospital Stay: Payer: Medicare HMO

## 2015-08-15 VITALS — BP 136/78 | HR 108 | Temp 98.3°F | Resp 18 | Wt 165.6 lb

## 2015-08-15 DIAGNOSIS — N1330 Unspecified hydronephrosis: Secondary | ICD-10-CM

## 2015-08-15 DIAGNOSIS — Z79811 Long term (current) use of aromatase inhibitors: Secondary | ICD-10-CM

## 2015-08-15 DIAGNOSIS — Z853 Personal history of malignant neoplasm of breast: Secondary | ICD-10-CM | POA: Diagnosis not present

## 2015-08-15 DIAGNOSIS — L539 Erythematous condition, unspecified: Secondary | ICD-10-CM

## 2015-08-15 DIAGNOSIS — F17210 Nicotine dependence, cigarettes, uncomplicated: Secondary | ICD-10-CM

## 2015-08-15 DIAGNOSIS — Z9011 Acquired absence of right breast and nipple: Secondary | ICD-10-CM | POA: Diagnosis not present

## 2015-08-15 DIAGNOSIS — E119 Type 2 diabetes mellitus without complications: Secondary | ICD-10-CM

## 2015-08-15 DIAGNOSIS — C50912 Malignant neoplasm of unspecified site of left female breast: Secondary | ICD-10-CM

## 2015-08-15 DIAGNOSIS — Z9981 Dependence on supplemental oxygen: Secondary | ICD-10-CM

## 2015-08-15 DIAGNOSIS — I2510 Atherosclerotic heart disease of native coronary artery without angina pectoris: Secondary | ICD-10-CM

## 2015-08-15 DIAGNOSIS — R590 Localized enlarged lymph nodes: Secondary | ICD-10-CM

## 2015-08-15 DIAGNOSIS — J449 Chronic obstructive pulmonary disease, unspecified: Secondary | ICD-10-CM

## 2015-08-15 LAB — COMPREHENSIVE METABOLIC PANEL
ALT: 11 U/L — AB (ref 14–54)
ANION GAP: 8 (ref 5–15)
AST: 19 U/L (ref 15–41)
Albumin: 4.2 g/dL (ref 3.5–5.0)
Alkaline Phosphatase: 73 U/L (ref 38–126)
BUN: 18 mg/dL (ref 6–20)
CHLORIDE: 104 mmol/L (ref 101–111)
CO2: 28 mmol/L (ref 22–32)
CREATININE: 0.92 mg/dL (ref 0.44–1.00)
Calcium: 9.8 mg/dL (ref 8.9–10.3)
Glucose, Bld: 90 mg/dL (ref 65–99)
Potassium: 4 mmol/L (ref 3.5–5.1)
SODIUM: 140 mmol/L (ref 135–145)
Total Bilirubin: 0.5 mg/dL (ref 0.3–1.2)
Total Protein: 8.2 g/dL — ABNORMAL HIGH (ref 6.5–8.1)

## 2015-08-15 LAB — CBC WITH DIFFERENTIAL/PLATELET
Basophils Absolute: 0.2 10*3/uL — ABNORMAL HIGH (ref 0–0.1)
Basophils Relative: 1 %
EOS ABS: 0.4 10*3/uL (ref 0–0.7)
EOS PCT: 4 %
HCT: 35.7 % (ref 35.0–47.0)
Hemoglobin: 11.4 g/dL — ABNORMAL LOW (ref 12.0–16.0)
LYMPHS ABS: 2.9 10*3/uL (ref 1.0–3.6)
LYMPHS PCT: 25 %
MCH: 26.4 pg (ref 26.0–34.0)
MCHC: 31.8 g/dL — AB (ref 32.0–36.0)
MCV: 82.9 fL (ref 80.0–100.0)
MONO ABS: 0.8 10*3/uL (ref 0.2–0.9)
MONOS PCT: 7 %
Neutro Abs: 7.5 10*3/uL — ABNORMAL HIGH (ref 1.4–6.5)
Neutrophils Relative %: 63 %
PLATELETS: 389 10*3/uL (ref 150–440)
RBC: 4.31 MIL/uL (ref 3.80–5.20)
RDW: 14.7 % — AB (ref 11.5–14.5)
WBC: 11.7 10*3/uL — ABNORMAL HIGH (ref 3.6–11.0)

## 2015-08-15 LAB — MAGNESIUM: MAGNESIUM: 1.9 mg/dL (ref 1.7–2.4)

## 2015-08-15 MED ORDER — PALBOCICLIB 125 MG PO CAPS
125.0000 mg | ORAL_CAPSULE | Freq: Every day | ORAL | Status: DC
Start: 2015-08-15 — End: 2015-11-25

## 2015-08-15 MED ORDER — LETROZOLE 2.5 MG PO TABS: 2.5000 mg | ORAL_TABLET | Freq: Every day | ORAL | Status: DC

## 2015-08-15 NOTE — Progress Notes (Signed)
Patient wants to know care plan.  Patient here for CT results.

## 2015-08-16 ENCOUNTER — Encounter: Payer: Self-pay | Admitting: Oncology

## 2015-08-16 LAB — CANCER ANTIGEN 27.29: CA 27.29: 114.4 U/mL — AB (ref 0.0–38.6)

## 2015-08-16 NOTE — Progress Notes (Signed)
[place] @ [facility] Telephone:([phone]) [phone]  Fax:([phone]) [place]: 10/24/1945  MR#: [id number]  HXT#:[id number]  Patient Care Team: Donnie Coffin, MD as PCP - General (Family Medicine) Donnie Coffin, MD as Referring Physician (Family Medicine) Seeplaputhur Robinette Haines, MD (General Surgery)  CHIEF COMPLAINT:  Chief Complaint  Patient presents with  . 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  HER-2/neu receptor negative Clinically stage is T4 N1 M1 tumor Stage IV disease 2.  Previous history of carcinoma of right breast status post mastectomy was done at [facility] exact staging not known Cording to patient she did not have any follow-up treatment  VISIT DIAGNOSIS:  Cancer of left breast  No flowsheet data found.  INTERVAL HISTORY: 69 year old lady who has a previous history of non-insulin-dependent diabetes.  And hypertension.  Patient had a very poor follow-up regarding her right breast carcinoma for which patient had mastectomy done at [facility].  Patient does not report to have any mammogram except for one mammogram 2 years ago when patient was told that has not in duration and needs to be evaluated.  Recently patient had a mammogram done and was referred to surgical service but biopsy was consistent with lobular cancer there were palpable lymph node in the left axilla.  Skin biopsy was negative.  Patient is here for ongoing evaluation and treatment consideration Estrogen and progesterone receptors are positive HER-2/neu receptor negative.. Previous history of diabetes.  Hypertension.  Patient is a chronic smoker and has been on oxygen for a almost last one year.  Patient continues to smoke Patient had a PET scan done   REVIEW OF SYSTEMS:   GENERAL: Patient is not in any acute distress PERFORMANCE STATUS (ECOG): 01 HEENT:  No visual changes, runny  nose, sore throat, mouth sores or tenderness. Lungs: Has a history of COPD.  Using inhaler.  On oxygen. Cardiac:  No chest pain, palpitations, orthopnea, or PND. GI:  No nausea, vomiting, diarrhea, constipation, melena or hematochezia. GU:  No urgency, frequency, dysuria, or hematuria. Musculoskeletal:  Patient started having increasing back pain for last year or so has been on meloxicam and muscle relaxant. Extremities:  No pain or swelling. Skin:  No rashes or skin changes. Neuro:  No headache, numbness or weakness, balance or coordination issues. Endocrine:  No diabetes, thyroid issues, hot flashes or night sweats. Psych:  No mood changes, depression or anxiety. Pain:  No focal pain. Review of systems:  All other systems reviewed and found to be negative.  As per HPI. Otherwise, a complete review of systems is negatve.  PAST MEDICAL HISTORY: Past Medical History  Diagnosis Date  . Hypertension   . Diabetes mellitus without complication ([place])   . GERD (gastroesophageal reflux disease)   . Breast cancer ([place]) 2009    RT MASTECTOMY  . Cancer of left breast ([place]) 08/09/2015    PAST SURGICAL HISTORY: Past Surgical History  Procedure Laterality Date  . Tubal ligation    . Breast surgery Right 2015    mastectomy  . Abdominal hysterectomy      FAMILY HISTORY Family History  Problem Relation Age of Onset  . Breast cancer Neg Hx          ADVANCED DIRECTIVES:  Patient does not have any living will or healthcare power of attorney.  Information was given .  Available resources had been discussed.  We will follow-up on subsequent appointments regarding this issue  HEALTH MAINTENANCE: Social History  Substance Use Topics  . Smoking status: Current Every Day Smoker -- 1.00 packs/day for 15 years    Types: Cigarettes  . Smokeless tobacco: None  . Alcohol Use: No   No Known Allergies  Current Outpatient Prescriptions  Medication Sig Dispense Refill  . ADVAIR DISKUS 500-50  MCG/DOSE AEPB     . aspirin 81 MG tablet Take 81 mg by mouth daily.    . calcium-vitamin D 250-100 MG-UNIT tablet Take 1 tablet by mouth 2 (two) times daily.    . CHANTIX 1 MG tablet Take 1 mg by mouth daily.     . cyclobenzaprine (FLEXERIL) 10 MG tablet Take 10 mg by mouth 2 (two) times daily.     . fluticasone (FLONASE) 50 MCG/ACT nasal spray Place 2 sprays into both nostrils daily.    [name] GLIPIZIDE XL 2.5 MG 24 hr tablet Take 2.5 mg by mouth daily with breakfast.     . hydrochlorothiazide (MICROZIDE) 12.5 MG capsule Take 12.5 mg by mouth daily.     [name] lisinopril (PRINIVIL,ZESTRIL) 40 MG tablet Take 40 mg by mouth daily.     . meloxicam (MOBIC) 7.5 MG tablet Take 7.5 mg by mouth daily.     . montelukast (SINGULAIR) 10 MG tablet Take 10 mg by mouth at bedtime.    [name] omeprazole (PRILOSEC) 20 MG capsule Take 20 mg by mouth daily.     . OXYGEN Inhale into the lungs.    . SPIRIVA HANDIHALER 18 MCG inhalation capsule Place 18 mcg into inhaler and inhale.     . letrozole (FEMARA) 2.5 MG tablet Take 1 tablet (2.5 mg total) by mouth daily. 90 tablet 3  . palbociclib (IBRANCE) 125 MG capsule Take 1 capsule (125 mg total) by mouth daily with breakfast. Take whole with food. 21 capsule 3   No current facility-administered medications for this visit.    OBJECTIVE: PHYSICAL EXAM: Patient is alert oriented not in any acute distress.  Head exam was generally normal. There was no scleral icterus or corneal arcus. Mucous membranes were moist. Examination of neck: No palpable lymphadenopathy.  Thyroid within normal limit Chest: Emphysematous chest.  Patient is on oxygen.  Coarse crepitation. Cardiac exam revealed the PMI to be normally situated and sized. The rhythm was regular and no extrasystoles were noted during several minutes of auscultation. The first and second heart sounds were normal and physiologic splitting of the second heart sound was noted. There were no murmurs, rubs, clicks, or  gallops. Abdominal exam revealed normal bowel sounds. The abdomen was soft, non-tender, and without masses, organomegaly, or appreciable enlargement of the abdominal aorta. Musculoskeletal system tenderness in mid back area.  A no other abnormality detected Examination of the skin revealed no evidence of significant rashes, suspicious appearing nevi or other concerning lesions. Neurologically, the patient was awake, alert, and oriented to person, place and time. There were no obvious focal neurologic abnormalities.  Right breast: Status post mastectomy no evidence of recurrent disease.  Left breast hard mass.  Palpable mass almost involving the whole breast.  Skin shows some redness.  Palpable left axillary lymphadenopathy  Filed Vitals:   08/15/15 1534  BP: 136/78  Pulse: 108  Temp: 98.3 F (36.8 C)  Resp: 18     Body mass index is 29.34 kg/(m^2).    ECOG FS:1 - Symptomatic but completely ambulatory  LAB RESULTS:  Appointment on 08/15/2015  Component Date  Value Ref Range Status  . WBC 08/15/2015 11.7* 3.6 - 11.0 K/uL Final  . RBC 08/15/2015 4.31  3.80 - 5.20 MIL/uL Final  . Hemoglobin 08/15/2015 11.4* 12.0 - 16.0 g/dL Final  . HCT 08/15/2015 35.7  35.0 - 47.0 % Final  . MCV 08/15/2015 82.9  80.0 - 100.0 fL Final  . MCH 08/15/2015 26.4  26.0 - 34.0 pg Final  . MCHC 08/15/2015 31.8* 32.0 - 36.0 g/dL Final  . RDW 08/15/2015 14.7* 11.5 - 14.5 % Final  . Platelets 08/15/2015 389  150 - 440 K/uL Final  . Neutrophils Relative % 08/15/2015 63   Final  . Neutro Abs 08/15/2015 7.5* 1.4 - 6.5 K/uL Final  . Lymphocytes Relative 08/15/2015 25   Final  . Lymphs Abs 08/15/2015 2.9  1.0 - 3.6 K/uL Final  . Monocytes Relative 08/15/2015 7   Final  . Monocytes Absolute 08/15/2015 0.8  0.2 - 0.9 K/uL Final  . Eosinophils Relative 08/15/2015 4   Final  . Eosinophils Absolute 08/15/2015 0.4  0 - 0.7 K/uL Final  . Basophils Relative 08/15/2015 1   Final  . Basophils Absolute 08/15/2015 0.2* 0 - 0.1  K/uL Final  . Sodium 08/15/2015 140  135 - 145 mmol/L Final  . Potassium 08/15/2015 4.0  3.5 - 5.1 mmol/L Final  . Chloride 08/15/2015 104  101 - 111 mmol/L Final  . CO2 08/15/2015 28  22 - 32 mmol/L Final  . Glucose, Bld 08/15/2015 90  65 - 99 mg/dL Final  . BUN 08/15/2015 18  6 - 20 mg/dL Final  . Creatinine, Ser 08/15/2015 0.92  0.44 - 1.00 mg/dL Final  . Calcium 08/15/2015 9.8  8.9 - 10.3 mg/dL Final  . Total Protein 08/15/2015 8.2* 6.5 - 8.1 g/dL Final  . Albumin 08/15/2015 4.2  3.5 - 5.0 g/dL Final  . AST 08/15/2015 19  15 - 41 U/L Final  . ALT 08/15/2015 11* 14 - 54 U/L Final  . Alkaline Phosphatase 08/15/2015 73  38 - 126 U/L Final  . Total Bilirubin 08/15/2015 0.5  0.3 - 1.2 mg/dL Final  . GFR calc non Af Amer 08/15/2015 >60  >60 mL/min Final  . GFR calc Af Amer 08/15/2015 >60  >60 mL/min Final   Comment: (NOTE) The eGFR has been calculated using the CKD EPI equation. This calculation has not been validated in all clinical situations. eGFR's persistently <60 mL/min signify possible Chronic Kidney Disease.   . Anion gap 08/15/2015 8  5 - 15 Final  . Magnesium 08/15/2015 1.9  1.7 - 2.4 mg/dL Final     STUDIES: Nm Pet Image Initial (pi) Skull Base To Thigh  08/10/2015  CLINICAL DATA:  Initial treatment strategy for left-sided breast cancer. Remote history of right-sided breast cancer diagnosed in 2009 status post right mastectomy at that time. EXAM: NUCLEAR MEDICINE PET SKULL BASE TO THIGH TECHNIQUE: 12.75 mCi F-18 FDG was injected intravenously. Full-ring PET imaging was performed from the skull base to thigh after the radiotracer. CT data was obtained and used for attenuation correction and anatomic localization. FASTING BLOOD GLUCOSE:  Value: 97 mg/dl COMPARISON:  Chest CT 04/08/2014. FINDINGS: NECK No hypermetabolic lymph nodes in the neck. Borderline enlarged 9 mm short axis left supraclavicular lymph node is hypermetabolic (SUVmax = 4.7). Diffuse hypermetabolism at the  level of the vocal cords, presumably physiologic. There is also hypermetabolic activity associated with the base of the tongue, slightly asymmetric to the right side (SUVmax = 6.6). CHEST Extensive skin thickening  in the left breast again noted, predominantly in the medial and inferior aspect of the left breast, with diffuse amorphous hypermetabolism throughout this region (SUVmax = 3.4). Multiple borderline enlarged and mildly enlarged left axillary lymph nodes measuring up to 1.4 cm in short axis (image 105 of series 3) are hypermetabolic (SUVmax = 4.3). Multiple borderline enlarged and mildly enlarged mediastinal and bilateral hilar lymph nodes are noted and are diffusely hypermetabolic. Specific examples include a 19 mm short axis low right paratracheal lymph node (SUVmax = 10.2) and a 1.8 cm short axis right hilar lymph node (SUVmax = 13.5). Multiple other smaller hypermetabolic left hilar, subcarinal and prevascular lymph nodes are also noted. No suspicious pulmonary nodules or masses. No acute consolidative airspace disease. No pleural effusions. Heart size is normal. There is no significant pericardial fluid, thickening or pericardial calcification. There is atherosclerosis of the thoracic aorta, the great vessels of the mediastinum and the coronary arteries, including calcified atherosclerotic plaque in the left anterior descending, left circumflex and right coronary arteries. Status post right modified radical mastectomy. ABDOMEN/PELVIS Bilateral adrenal glands are hypermetabolic (SUVmax = 6.3 on the right and 6.7 on the left). Right adrenal gland appears mildly thickened, while there is a 2.1 x 2.4 cm nodule in the left adrenal gland.) Multiple borderline enlarged retroperitoneal lymph nodes are noted, but some of these are in hypermetabolic (SUVmax = 4.8), including an 8 mm short axis right para-aortic lymph node (image 176 of series 3). No abnormal hypermetabolic activity within the liver, pancreas or  spleen. Right ovary is slightly prominent and nodular contour, measuring 3.4 x 2.3 cm, and demonstrates diffuse low-level hypermetabolism (SUVmax = 5.3). Mild bilateral hydronephrosis and perinephric stranding. Ureters do not appear dilated. No significant volume of ascites. No pneumoperitoneum. No pathologic dilatation of small bowel or colon. Normal appendix. Extensive atherosclerosis throughout the abdominal and pelvic vasculature, without definite aneurysm. SKELETON Subtle ill-defined areas of lucency are noted in the anterior aspect of L3 vertebral body (image 166 of series 3, and in the left pedicle of L3 (image 165 of series 3), both of which are hypermetabolic (SUVmax = 6.3), concerning for metastatic lesions. No focal hypermetabolic activity to suggest skeletal metastasis. IMPRESSION: 1. Skin thickening in the left breast redemonstrated, which is diffusely hypermetabolic, with associated left axillary lymphadenopathy and left supraclavicular lymphadenopathy which is also hypermetabolic, compatible with the reported clinical history breast cancer. There are numerous hypermetabolic mediastinal and bilateral hilar lymph nodes, retroperitoneal lymph nodes, retroperitoneal lymph nodes, bilateral adrenal lesions and lesions in the L3 vertebra, as detailed above, concerning for widespread metastatic disease. The possibility of concurrent lymphoproliferative disease is not entirely excluded, and correlation with biopsy of mediastinal lymph nodes is suggested to establish a tissue diagnosis if clinically appropriate. 2. In addition, there is prominence of the right ovary which demonstrates some hypermetabolism (SUVmax = 5.3). The possibility of a metastatic lesion in the right ovary or primary ovarian lesion should be considered. 3. Slightly asymmetric hypermetabolism associated with the base of the tongue (right greater than left). This is nonspecific and may be physiologic, but correlation with physical  examination is recommended. 4. Bilateral hydronephrosis and perinephric stranding. This is of uncertain etiology. No ureteral dilatation is noted. Additionally, no urinary tract calculi are identified. 5. Atherosclerosis, including 3 vessel coronary artery disease. Please note that although the presence of coronary artery calcium documents the presence of coronary artery disease, the severity of this disease and any potential stenosis cannot be assessed on this non-gated CT examination.  Assessment for potential risk factor modification, dietary therapy or pharmacologic therapy may be warranted, if clinically indicated. 6. Additional incidental findings, as above. Electronically Signed   By: Vinnie Langton M.D.   On: 08/10/2015 14:19    ASSESSMENT: Locally advanced carcinoma of breast (left) Biopsies consistent with lobular carcinoma.  Skin biopsies negative.  Hormone receptor and HER-2/neu receptor pending. Patient is palpable lymphadenopathy Further staging workup with a PET scan is pending  2.  Previous history of carcinoma of right breast status post mastectomy done at [facility] in 2009 Sex stage was not known that was no follow-up mammogram or anti-hormonal therapy  Comorbid conditions include COPD.  Continuing smoking.  Non-insulin-dependent diabetes. Hypertension.  PLAN: I  PET scan has been reviewed shows multiple lymphadenopathy in the axilla hilar mediastinal and retroperitoneal Adrenal mets  Bilateral hydronephrosis unknown etiology  PET scan has been reviewed independently reviewed with the patient  Case was discussed in multidisciplinary meeting  Will start patient on letrozole and IBRANCE To further define bilateral hydronephrosis and pelvic ultrasound and ultrasound of both kidneys has been recommended All lab data has been reviewed I had prolonged discussion with Dr. Jamal Collin  regarding local management of the left breast Patient may need eventually mastectomy We  will wait for at least 3 months of anti-hormonal therapy to assess the response. Patient continues to smoke on oxygen therapy. Duration of visit is 45 minutes and 50% of time was spent discussing VARIOUS  options  Side effect of IBRANCE and letrozole Management of the left breast discussing with surgical service   No matching staging information was found for the patient.  Forest Gleason, MD   08/16/2015 8:37 AM

## 2015-08-19 ENCOUNTER — Ambulatory Visit
Admission: RE | Admit: 2015-08-19 | Discharge: 2015-08-19 | Disposition: A | Discharge status: Home / Self Care | Payer: Medicare HMO | Source: Ambulatory Visit | Attending: Oncology | Admitting: Oncology

## 2015-08-19 DIAGNOSIS — N1330 Unspecified hydronephrosis: Secondary | ICD-10-CM | POA: Diagnosis present

## 2015-08-31 ENCOUNTER — Other Ambulatory Visit: Payer: Self-pay | Admitting: *Deleted

## 2015-08-31 DIAGNOSIS — C50912 Malignant neoplasm of unspecified site of left female breast: Secondary | ICD-10-CM

## 2015-09-01 ENCOUNTER — Inpatient Hospital Stay: Payer: Medicare HMO

## 2015-09-01 ENCOUNTER — Inpatient Hospital Stay: Payer: Medicare HMO | Attending: Oncology | Admitting: Oncology

## 2015-09-01 VITALS — BP 136/71 | HR 108 | Temp 97.1°F | Resp 18 | Wt 165.0 lb

## 2015-09-01 DIAGNOSIS — N1330 Unspecified hydronephrosis: Secondary | ICD-10-CM | POA: Diagnosis not present

## 2015-09-01 DIAGNOSIS — Z170 Estrogen receptor positive status [ER+]: Secondary | ICD-10-CM | POA: Diagnosis not present

## 2015-09-01 DIAGNOSIS — J449 Chronic obstructive pulmonary disease, unspecified: Secondary | ICD-10-CM | POA: Diagnosis not present

## 2015-09-01 DIAGNOSIS — I10 Essential (primary) hypertension: Secondary | ICD-10-CM

## 2015-09-01 DIAGNOSIS — R590 Localized enlarged lymph nodes: Secondary | ICD-10-CM | POA: Insufficient documentation

## 2015-09-01 DIAGNOSIS — Z79899 Other long term (current) drug therapy: Secondary | ICD-10-CM | POA: Diagnosis not present

## 2015-09-01 DIAGNOSIS — Z9981 Dependence on supplemental oxygen: Secondary | ICD-10-CM | POA: Diagnosis not present

## 2015-09-01 DIAGNOSIS — F17210 Nicotine dependence, cigarettes, uncomplicated: Secondary | ICD-10-CM | POA: Diagnosis not present

## 2015-09-01 DIAGNOSIS — Z9011 Acquired absence of right breast and nipple: Secondary | ICD-10-CM | POA: Diagnosis not present

## 2015-09-01 DIAGNOSIS — Z853 Personal history of malignant neoplasm of breast: Secondary | ICD-10-CM | POA: Insufficient documentation

## 2015-09-01 DIAGNOSIS — E119 Type 2 diabetes mellitus without complications: Secondary | ICD-10-CM

## 2015-09-01 DIAGNOSIS — Z79811 Long term (current) use of aromatase inhibitors: Secondary | ICD-10-CM | POA: Diagnosis not present

## 2015-09-01 DIAGNOSIS — C50912 Malignant neoplasm of unspecified site of left female breast: Secondary | ICD-10-CM | POA: Diagnosis not present

## 2015-09-01 DIAGNOSIS — Z7982 Long term (current) use of aspirin: Secondary | ICD-10-CM | POA: Diagnosis not present

## 2015-09-01 LAB — COMPREHENSIVE METABOLIC PANEL
ALBUMIN: 4.2 g/dL (ref 3.5–5.0)
ALT: 12 U/L — AB (ref 14–54)
AST: 18 U/L (ref 15–41)
Alkaline Phosphatase: 81 U/L (ref 38–126)
Anion gap: 9 (ref 5–15)
BUN: 22 mg/dL — AB (ref 6–20)
CHLORIDE: 102 mmol/L (ref 101–111)
CO2: 24 mmol/L (ref 22–32)
CREATININE: 1.33 mg/dL — AB (ref 0.44–1.00)
Calcium: 9.3 mg/dL (ref 8.9–10.3)
GFR calc Af Amer: 47 mL/min — ABNORMAL LOW (ref >60)
GFR calc non Af Amer: 40 mL/min — ABNORMAL LOW (ref >60)
GLUCOSE: 116 mg/dL — AB (ref 65–99)
POTASSIUM: 4.2 mmol/L (ref 3.5–5.1)
SODIUM: 135 mmol/L (ref 135–145)
Total Bilirubin: 0.8 mg/dL (ref 0.3–1.2)
Total Protein: 7.9 g/dL (ref 6.5–8.1)

## 2015-09-01 LAB — CBC WITH DIFFERENTIAL/PLATELET
BASOS ABS: 0 10*3/uL (ref 0–0.1)
BASOS PCT: 1 %
EOS PCT: 3 %
Eosinophils Absolute: 0.1 10*3/uL (ref 0–0.7)
HCT: 33.4 % — ABNORMAL LOW (ref 35.0–47.0)
Hemoglobin: 10.9 g/dL — ABNORMAL LOW (ref 12.0–16.0)
LYMPHS ABS: 2.2 10*3/uL (ref 1.0–3.6)
LYMPHS PCT: 45 %
MCH: 26.7 pg (ref 26.0–34.0)
MCHC: 32.6 g/dL (ref 32.0–36.0)
MCV: 81.8 fL (ref 80.0–100.0)
Monocytes Absolute: 0.1 10*3/uL — ABNORMAL LOW (ref 0.2–0.9)
Monocytes Relative: 2 %
NEUTROS ABS: 2.4 10*3/uL (ref 1.4–6.5)
Neutrophils Relative %: 49 %
PLATELETS: 344 10*3/uL (ref 150–440)
RBC: 4.09 MIL/uL (ref 3.80–5.20)
RDW: 13.9 % (ref 11.5–14.5)
WBC: 4.8 10*3/uL (ref 3.6–11.0)

## 2015-09-02 LAB — CANCER ANTIGEN 27.29: CA 27.29: 88.2 U/mL — AB (ref 0.0–38.6)

## 2015-09-03 ENCOUNTER — Encounter: Payer: Self-pay | Admitting: Oncology

## 2015-09-03 NOTE — Progress Notes (Signed)
[place] @ [facility] [phone]  Fax:([phone]) [place]: 03-29-1946  MR#: [id number]  VFI#:[id number]  Patient Care Team: Donnie Coffin, MD as PCP - General (Family Medicine) Donnie Coffin, MD as Referring Physician (Family Medicine) Seeplaputhur Robinette Haines, MD (General Surgery)  CHIEF COMPLAINT:  Chief Complaint  Patient presents with  . 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  HER-2/neu receptor negative Clinically stage is T4 N1 M1 tumor Stage IV disease 2.  Previous history of carcinoma of right breast status post mastectomy was done at [facility] not known Cording to patient she did not have any follow-up treatment. 3.  Patient has been started on letrozole and IBRANCE from December, 2016  VISIT DIAGNOSIS:  Cancer of left breast  No flowsheet data found.  INTERVAL HISTORY: 70 year old lady who has a previous history of non-insulin-dependent diabetes.  And hypertension.  Patient had a very poor follow-up regarding her right breast carcinoma for which patient had mastectomy done at [facility].  Patient does not report to have any mammogram except for one mammogram 2 years ago when patient was told that has not in duration and needs to be evaluated.  Recently patient had a mammogram done and was referred to surgical service but biopsy was consistent with lobular cancer there were palpable lymph node in the left axilla.  Skin biopsy was negative.   Patient has been started on anti-hormonal therapy and has been tolerating very well.  Has noticed some softening of the left breast mass.  No chills.  No fever.  Appetite has been stable.  Soreness in the mouth   REVIEW OF SYSTEMS:   GENERAL: Patient is not in any acute distress PERFORMANCE STATUS (ECOG): 01 HEENT:  No visual changes, runny nose, sore throat, mouth sores or  tenderness. Lungs: Has a history of COPD.  Using inhaler.  On oxygen. Cardiac:  No chest pain, palpitations, orthopnea, or PND. GI:  No nausea, vomiting, diarrhea, constipation, melena or hematochezia. GU:  No urgency, frequency, dysuria, or hematuria. Musculoskeletal:  Patient started having increasing back pain for last year or so has been on meloxicam and muscle relaxant. Extremities:  No pain or swelling. Skin:  No rashes or skin changes. Neuro:  No headache, numbness or weakness, balance or coordination issues. Endocrine:  No diabetes, thyroid issues, hot flashes or night sweats. Psych:  No mood changes, depression or anxiety. Pain:  No focal pain. Review of systems:  All other systems reviewed and found to be negative.  As per HPI. Otherwise, a complete review of systems is negatve.  PAST MEDICAL HISTORY: Past Medical History  Diagnosis Date  . Hypertension   . Diabetes mellitus without complication ([place])   . GERD (gastroesophageal reflux disease)   . Breast cancer ([place]) 2009    RT MASTECTOMY  . Cancer of left breast ([place]) 08/09/2015    PAST SURGICAL HISTORY: Past Surgical History  Procedure Laterality Date  . Tubal ligation    . Breast surgery Right 2015    mastectomy  . Abdominal hysterectomy      FAMILY HISTORY Family History  Problem Relation Age of Onset  . Breast cancer Neg Hx          ADVANCED DIRECTIVES:  Patient does not have any living will or healthcare power of attorney.  Information was given .  Available resources had been discussed.  We will follow-up on subsequent appointments regarding this issue  HEALTH MAINTENANCE: Social History  Substance Use Topics  . Smoking status: Current Every Day Smoker -- 1.00 packs/day for 15 years    Types: Cigarettes  . Smokeless tobacco: None  . Alcohol Use: No   No Known Allergies  Current Outpatient Prescriptions  Medication Sig Dispense Refill  . ADVAIR DISKUS 500-50 MCG/DOSE AEPB     . aspirin 81  MG tablet Take 81 mg by mouth daily.    . calcium-vitamin D 250-100 MG-UNIT tablet Take 1 tablet by mouth 2 (two) times daily.    . CHANTIX 1 MG tablet Take 1 mg by mouth daily.     . cyclobenzaprine (FLEXERIL) 10 MG tablet Take 10 mg by mouth 2 (two) times daily.     . fluticasone (FLONASE) 50 MCG/ACT nasal spray Place 2 sprays into both nostrils daily.    [name] GLIPIZIDE XL 2.5 MG 24 hr tablet Take 2.5 mg by mouth daily with breakfast.     . hydrochlorothiazide (MICROZIDE) 12.5 MG capsule Take 12.5 mg by mouth daily.     [name] letrozole (FEMARA) 2.5 MG tablet Take 1 tablet (2.5 mg total) by mouth daily. 90 tablet 3  . lisinopril (PRINIVIL,ZESTRIL) 40 MG tablet Take 40 mg by mouth daily.     . meloxicam (MOBIC) 7.5 MG tablet Take 7.5 mg by mouth daily.     . montelukast (SINGULAIR) 10 MG tablet Take 10 mg by mouth at bedtime.    [name] omeprazole (PRILOSEC) 20 MG capsule Take 20 mg by mouth daily.     . OXYGEN Inhale into the lungs.    . palbociclib (IBRANCE) 125 MG capsule Take 1 capsule (125 mg total) by mouth daily with breakfast. Take whole with food. (Patient taking differently: Take 125 mg by mouth daily with breakfast. Take whole with food. Take for 21 days then 7 days off.) 21 capsule 3  . SPIRIVA HANDIHALER 18 MCG inhalation capsule Place 18 mcg into inhaler and inhale.      No current facility-administered medications for this visit.    OBJECTIVE: PHYSICAL EXAM: Patient is alert oriented not in any acute distress.  Head exam was generally normal. There was no scleral icterus or corneal arcus. Mucous membranes were moist. Examination of neck: No palpable lymphadenopathy.  Thyroid within normal limit Chest: Emphysematous chest.  Patient is on oxygen.  Coarse crepitation. Cardiac exam revealed the PMI to be normally situated and sized. The rhythm was regular and no extrasystoles were noted during several minutes of auscultation. The first and second heart sounds were normal and physiologic  splitting of the second heart sound was noted. There were no murmurs, rubs, clicks, or gallops. Abdominal exam revealed normal bowel sounds. The abdomen was soft, non-tender, and without masses, organomegaly, or appreciable enlargement of the abdominal aorta. Musculoskeletal system tenderness in mid back area.  A no other abnormality detected Examination of the skin revealed no evidence of significant rashes, suspicious appearing nevi or other concerning lesions. Neurologically, the patient was awake, alert, and oriented to person, place and time. There were no obvious focal neurologic abnormalities.  Right breast: Status post mastectomy no evidence of recurrent disease.  Left breast hard mass.  Palpable mass almost involving the whole breast.  Skin shows some redness.  Palpable left axillary lymphadenopathy .  Mass in the lymph node enlargement is decreased in size Filed Vitals:   09/01/15 1207  BP: 136/71  Pulse: 108  Temp: 97.1 F (36.2  C)  Resp: 18     Body mass index is 29.24 kg/(m^2).    ECOG FS:1 - Symptomatic but completely ambulatory  LAB RESULTS:  Appointment on 09/01/2015  Component Date Value Ref Range Status  . WBC 09/01/2015 4.8  3.6 - 11.0 K/uL Final  . RBC 09/01/2015 4.09  3.80 - 5.20 MIL/uL Final  . Hemoglobin 09/01/2015 10.9* 12.0 - 16.0 g/dL Final  . HCT 09/01/2015 33.4* 35.0 - 47.0 % Final  . MCV 09/01/2015 81.8  80.0 - 100.0 fL Final  . MCH 09/01/2015 26.7  26.0 - 34.0 pg Final  . MCHC 09/01/2015 32.6  32.0 - 36.0 g/dL Final  . RDW 09/01/2015 13.9  11.5 - 14.5 % Final  . Platelets 09/01/2015 344  150 - 440 K/uL Final  . Neutrophils Relative % 09/01/2015 49   Final  . Neutro Abs 09/01/2015 2.4  1.4 - 6.5 K/uL Final  . Lymphocytes Relative 09/01/2015 45   Final  . Lymphs Abs 09/01/2015 2.2  1.0 - 3.6 K/uL Final  . Monocytes Relative 09/01/2015 2   Final  . Monocytes Absolute 09/01/2015 0.1* 0.2 - 0.9 K/uL Final  . Eosinophils Relative 09/01/2015 3   Final  .  Eosinophils Absolute 09/01/2015 0.1  0 - 0.7 K/uL Final  . Basophils Relative 09/01/2015 1   Final  . Basophils Absolute 09/01/2015 0.0  0 - 0.1 K/uL Final  . Sodium 09/01/2015 135  135 - 145 mmol/L Final  . Potassium 09/01/2015 4.2  3.5 - 5.1 mmol/L Final  . Chloride 09/01/2015 102  101 - 111 mmol/L Final  . CO2 09/01/2015 24  22 - 32 mmol/L Final  . Glucose, Bld 09/01/2015 116* 65 - 99 mg/dL Final  . BUN 09/01/2015 22* 6 - 20 mg/dL Final  . Creatinine, Ser 09/01/2015 1.33* 0.44 - 1.00 mg/dL Final  . Calcium 09/01/2015 9.3  8.9 - 10.3 mg/dL Final  . Total Protein 09/01/2015 7.9  6.5 - 8.1 g/dL Final  . Albumin 09/01/2015 4.2  3.5 - 5.0 g/dL Final  . AST 09/01/2015 18  15 - 41 U/L Final  . ALT 09/01/2015 12* 14 - 54 U/L Final  . Alkaline Phosphatase 09/01/2015 81  38 - 126 U/L Final  . Total Bilirubin 09/01/2015 0.8  0.3 - 1.2 mg/dL Final  . GFR calc non Af Amer 09/01/2015 40* >60 mL/min Final  . GFR calc Af Amer 09/01/2015 47* >60 mL/min Final   Comment: (NOTE) The eGFR has been calculated using the CKD EPI equation. This calculation has not been validated in all clinical situations. eGFR's persistently <60 mL/min signify possible Chronic Kidney Disease.   . Anion gap 09/01/2015 9  5 - 15 Final  . CA 27.29 09/01/2015 88.2* 0.0 - 38.6 U/mL Final   Comment: (NOTE) Bayer Centaur/ACS methodology Performed At: [facility] [address] [employer] [place], [place] [id number] Lindon Romp MD WC:[id number]      STUDIES: US Renal  08/19/2015  CLINICAL DATA:  Hydronephrosis seen on PET CT EXAM: RENAL / URINARY TRACT ULTRASOUND COMPLETE COMPARISON:  PET CT 08/10/2015 FINDINGS: Right Kidney: Length: 10.6 cm. Normal echotexture. No mass. Mild hydronephrosis as seen on CT. Left Kidney: Length: 10.7 cm. Normal echotexture. No mass. Mild hydronephrosis as seen on CT. Bladder: Appears normal for degree of bladder distention. IMPRESSION: Mild bilateral hydronephrosis as seen on prior PET CT.  Electronically Signed   By: Rolm Baptise M.D.   On: 08/19/2015 14:01   Nm Pet Image Initial (pi)  Skull Base To Thigh  08/10/2015  CLINICAL DATA:  Initial treatment strategy for left-sided breast cancer. Remote history of right-sided breast cancer diagnosed in 2009 status post right mastectomy at that time. EXAM: NUCLEAR MEDICINE PET SKULL BASE TO THIGH TECHNIQUE: 12.75 mCi F-18 FDG was injected intravenously. Full-ring PET imaging was performed from the skull base to thigh after the radiotracer. CT data was obtained and used for attenuation correction and anatomic localization. FASTING BLOOD GLUCOSE:  Value: 97 mg/dl COMPARISON:  Chest CT 04/08/2014. FINDINGS: NECK No hypermetabolic lymph nodes in the neck. Borderline enlarged 9 mm short axis left supraclavicular lymph node is hypermetabolic (SUVmax = 4.7). Diffuse hypermetabolism at the level of the vocal cords, presumably physiologic. There is also hypermetabolic activity associated with the base of the tongue, slightly asymmetric to the right side (SUVmax = 6.6). CHEST Extensive skin thickening in the left breast again noted, predominantly in the medial and inferior aspect of the left breast, with diffuse amorphous hypermetabolism throughout this region (SUVmax = 3.4). Multiple borderline enlarged and mildly enlarged left axillary lymph nodes measuring up to 1.4 cm in short axis (image 105 of series 3) are hypermetabolic (SUVmax = 4.3). Multiple borderline enlarged and mildly enlarged mediastinal and bilateral hilar lymph nodes are noted and are diffusely hypermetabolic. Specific examples include a 19 mm short axis low right paratracheal lymph node (SUVmax = 10.2) and a 1.8 cm short axis right hilar lymph node (SUVmax = 13.5). Multiple other smaller hypermetabolic left hilar, subcarinal and prevascular lymph nodes are also noted. No suspicious pulmonary nodules or masses. No acute consolidative airspace disease. No pleural effusions. Heart size is normal.  There is no significant pericardial fluid, thickening or pericardial calcification. There is atherosclerosis of the thoracic aorta, the great vessels of the mediastinum and the coronary arteries, including calcified atherosclerotic plaque in the left anterior descending, left circumflex and right coronary arteries. Status post right modified radical mastectomy. ABDOMEN/PELVIS Bilateral adrenal glands are hypermetabolic (SUVmax = 6.3 on the right and 6.7 on the left). Right adrenal gland appears mildly thickened, while there is a 2.1 x 2.4 cm nodule in the left adrenal gland.) Multiple borderline enlarged retroperitoneal lymph nodes are noted, but some of these are in hypermetabolic (SUVmax = 4.8), including an 8 mm short axis right para-aortic lymph node (image 176 of series 3). No abnormal hypermetabolic activity within the liver, pancreas or spleen. Right ovary is slightly prominent and nodular contour, measuring 3.4 x 2.3 cm, and demonstrates diffuse low-level hypermetabolism (SUVmax = 5.3). Mild bilateral hydronephrosis and perinephric stranding. Ureters do not appear dilated. No significant volume of ascites. No pneumoperitoneum. No pathologic dilatation of small bowel or colon. Normal appendix. Extensive atherosclerosis throughout the abdominal and pelvic vasculature, without definite aneurysm. SKELETON Subtle ill-defined areas of lucency are noted in the anterior aspect of L3 vertebral body (image 166 of series 3, and in the left pedicle of L3 (image 165 of series 3), both of which are hypermetabolic (SUVmax = 6.3), concerning for metastatic lesions. No focal hypermetabolic activity to suggest skeletal metastasis. IMPRESSION: 1. Skin thickening in the left breast redemonstrated, which is diffusely hypermetabolic, with associated left axillary lymphadenopathy and left supraclavicular lymphadenopathy which is also hypermetabolic, compatible with the reported clinical history breast cancer. There are numerous  hypermetabolic mediastinal and bilateral hilar lymph nodes, retroperitoneal lymph nodes, retroperitoneal lymph nodes, bilateral adrenal lesions and lesions in the L3 vertebra, as detailed above, concerning for widespread metastatic disease. The possibility of concurrent lymphoproliferative disease is not entirely  excluded, and correlation with biopsy of mediastinal lymph nodes is suggested to establish a tissue diagnosis if clinically appropriate. 2. In addition, there is prominence of the right ovary which demonstrates some hypermetabolism (SUVmax = 5.3). The possibility of a metastatic lesion in the right ovary or primary ovarian lesion should be considered. 3. Slightly asymmetric hypermetabolism associated with the base of the tongue (right greater than left). This is nonspecific and may be physiologic, but correlation with physical examination is recommended. 4. Bilateral hydronephrosis and perinephric stranding. This is of uncertain etiology. No ureteral dilatation is noted. Additionally, no urinary tract calculi are identified. 5. Atherosclerosis, including 3 vessel coronary artery disease. Please note that although the presence of coronary artery calcium documents the presence of coronary artery disease, the severity of this disease and any potential stenosis cannot be assessed on this non-gated CT examination. Assessment for potential risk factor modification, dietary therapy or pharmacologic therapy may be warranted, if clinically indicated. 6. Additional incidental findings, as above. Electronically Signed   By: Vinnie Langton M.D.   On: 08/10/2015 14:19    ASSESSMENT: Locally advanced carcinoma of breast (left) Biopsies consistent with lobular carcinoma.  Skin biopsies negative.  Hormone receptor and HER-2/neu receptor pending. Patient  has  palpable lymphadenopathy She  has been started on Femara and IBRANCE tolerating very well. No significant side effect.  No evidence of  myelosuppression. Left  breast mass is slightly soft.  Will continue anti-hormonal therapy and reevaluate patient in one month  2.  Previous history of carcinoma of right breast status post mastectomy done at [facility] in 2009  stage was not known that was no follow-up mammogram or anti-hormonal therapy  Comorbid conditions include COPD.  Continuing smoking.  Non-insulin-dependent diabetes. Hypertension.  PLAN: I  PET scan has been reviewed shows multiple lymphadenopathy in the axilla hilar mediastinal and retroperitoneal Adrenal mets  Bilateral hydronephrosis unknown etiology  PET scan has been reviewed independently reviewed with the patient  Case was discussed in multidisciplinary meeting  Will start patient on letrozole and IBRANCE To further define bilateral hydronephrosis and pelvic ultrasound and ultrasound of both kidneys has been recommended All lab data has been reviewed I had prolonged discussion with Dr. Jamal Collin  regarding local management of the left breast Patient may need eventually mastectomy We will wait for at least 3 months of anti-hormonal therapy to assess the response. Patient continues to smoke on oxygen therapy. Duration of visit is 45 minutes and 50% of time was spent discussing VARIOUS  options  Side effect of IBRANCE and letrozole Management of the left breast discussing with surgical service   No matching staging information was found for the patient.  Forest Gleason, MD   09/03/2015 8:36 AM

## 2015-09-13 ENCOUNTER — Encounter: Payer: Self-pay | Admitting: *Deleted

## 2015-09-13 NOTE — Progress Notes (Signed)
  Oncology Nurse Navigator Documentation  Navigator Location: [facility] (09/13/15 1600) Navigator Encounter Type: Other (mailed card) (09/13/15 1600)         Treatment Initiated Date: 09/01/15 (09/13/15 1600) Patient Visit Type: Follow-up (09/13/15 1600)                    Acuity: Level 1 (09/13/15 1600)         Time Spent with Patient: 15 (09/13/15 1600)

## 2015-09-29 ENCOUNTER — Inpatient Hospital Stay: Payer: Medicare HMO

## 2015-09-29 ENCOUNTER — Inpatient Hospital Stay: Payer: Medicare HMO | Admitting: Oncology

## 2015-10-17 ENCOUNTER — Inpatient Hospital Stay: Payer: Medicare HMO

## 2015-10-17 ENCOUNTER — Inpatient Hospital Stay: Payer: Medicare HMO | Attending: Oncology | Admitting: Oncology

## 2015-10-17 ENCOUNTER — Encounter: Payer: Self-pay | Admitting: Oncology

## 2015-10-17 VITALS — BP 119/60 | HR 103 | Temp 97.2°F | Resp 18 | Wt 167.1 lb

## 2015-10-17 DIAGNOSIS — N63 Unspecified lump in breast: Secondary | ICD-10-CM | POA: Insufficient documentation

## 2015-10-17 DIAGNOSIS — Z79811 Long term (current) use of aromatase inhibitors: Secondary | ICD-10-CM | POA: Insufficient documentation

## 2015-10-17 DIAGNOSIS — C50912 Malignant neoplasm of unspecified site of left female breast: Secondary | ICD-10-CM

## 2015-10-17 DIAGNOSIS — Z9981 Dependence on supplemental oxygen: Secondary | ICD-10-CM | POA: Insufficient documentation

## 2015-10-17 DIAGNOSIS — Z794 Long term (current) use of insulin: Secondary | ICD-10-CM | POA: Diagnosis not present

## 2015-10-17 DIAGNOSIS — J449 Chronic obstructive pulmonary disease, unspecified: Secondary | ICD-10-CM | POA: Diagnosis not present

## 2015-10-17 DIAGNOSIS — Z9011 Acquired absence of right breast and nipple: Secondary | ICD-10-CM | POA: Diagnosis not present

## 2015-10-17 DIAGNOSIS — I10 Essential (primary) hypertension: Secondary | ICD-10-CM | POA: Insufficient documentation

## 2015-10-17 DIAGNOSIS — Z7982 Long term (current) use of aspirin: Secondary | ICD-10-CM | POA: Diagnosis not present

## 2015-10-17 DIAGNOSIS — E119 Type 2 diabetes mellitus without complications: Secondary | ICD-10-CM | POA: Insufficient documentation

## 2015-10-17 DIAGNOSIS — F17210 Nicotine dependence, cigarettes, uncomplicated: Secondary | ICD-10-CM | POA: Insufficient documentation

## 2015-10-17 DIAGNOSIS — K219 Gastro-esophageal reflux disease without esophagitis: Secondary | ICD-10-CM | POA: Diagnosis not present

## 2015-10-17 DIAGNOSIS — Z79899 Other long term (current) drug therapy: Secondary | ICD-10-CM | POA: Insufficient documentation

## 2015-10-17 DIAGNOSIS — Z170 Estrogen receptor positive status [ER+]: Secondary | ICD-10-CM | POA: Diagnosis not present

## 2015-10-17 DIAGNOSIS — Z853 Personal history of malignant neoplasm of breast: Secondary | ICD-10-CM | POA: Insufficient documentation

## 2015-10-17 LAB — COMPREHENSIVE METABOLIC PANEL
ALT: 9 U/L — ABNORMAL LOW (ref 14–54)
ANION GAP: 4 — AB (ref 5–15)
AST: 13 U/L — ABNORMAL LOW (ref 15–41)
Albumin: 4 g/dL (ref 3.5–5.0)
Alkaline Phosphatase: 68 U/L (ref 38–126)
BUN: 18 mg/dL (ref 6–20)
CHLORIDE: 104 mmol/L (ref 101–111)
CO2: 29 mmol/L (ref 22–32)
Calcium: 9.9 mg/dL (ref 8.9–10.3)
Creatinine, Ser: 0.95 mg/dL (ref 0.44–1.00)
GFR calc non Af Amer: >60 mL/min (ref >60)
Glucose, Bld: 105 mg/dL — ABNORMAL HIGH (ref 65–99)
POTASSIUM: 4.4 mmol/L (ref 3.5–5.1)
SODIUM: 137 mmol/L (ref 135–145)
Total Bilirubin: 0.3 mg/dL (ref 0.3–1.2)
Total Protein: 7.4 g/dL (ref 6.5–8.1)

## 2015-10-17 LAB — CBC WITH DIFFERENTIAL/PLATELET
Basophils Absolute: 0 10*3/uL (ref 0–0.1)
Basophils Relative: 1 %
EOS ABS: 0.2 10*3/uL (ref 0–0.7)
EOS PCT: 3 %
HCT: 31.1 % — ABNORMAL LOW (ref 35.0–47.0)
Hemoglobin: 10.4 g/dL — ABNORMAL LOW (ref 12.0–16.0)
LYMPHS ABS: 2.5 10*3/uL (ref 1.0–3.6)
Lymphocytes Relative: 37 %
MCH: 29 pg (ref 26.0–34.0)
MCHC: 33.3 g/dL (ref 32.0–36.0)
MCV: 87 fL (ref 80.0–100.0)
MONO ABS: 0.7 10*3/uL (ref 0.2–0.9)
MONOS PCT: 11 %
Neutro Abs: 3.2 10*3/uL (ref 1.4–6.5)
Neutrophils Relative %: 48 %
PLATELETS: 297 10*3/uL (ref 150–440)
RBC: 3.58 MIL/uL — AB (ref 3.80–5.20)
RDW: 22.1 % — AB (ref 11.5–14.5)
WBC: 6.7 10*3/uL (ref 3.6–11.0)

## 2015-10-17 NOTE — Progress Notes (Signed)
[place] @ [facility] Telephone:(336) [phone]  Fax:(336) [place]: 1946/04/18  MR#: [id number]  KGU#:[id number]  Patient Care Team: Donnie Coffin, MD as PCP - General (Family Medicine) Donnie Coffin, MD as Referring Physician (Family Medicine) Seeplaputhur Robinette Haines, MD (General Surgery)  CHIEF COMPLAINT:  Chief Complaint  Patient presents with  . 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  HER-2/neu receptor negative Clinically stage is T4 N1 M1 tumor Stage IV disease 2.  Previous history of carcinoma of right breast status post mastectomy was done at [place] not known Cording to patient she did not have any follow-up treatment. 3.  Patient has been started on letrozole and IBRANCE from December, 2016  VISIT DIAGNOSIS:  Cancer of left breast  No flowsheet data found.  INTERVAL HISTORY: 70 year old lady who has a previous history of non-insulin-dependent diabetes.  And hypertension.  Patient had a very poor follow-up regarding her right breast carcinoma for which patient had mastectomy done at [facility].  Patient does not report to have any mammogram except for one mammogram 2 years ago when patient was told that has not in duration and needs to be evaluated.  Recently starting second cycle of treatment with IBRANCE and letrozole.  Tolerated treatment very well.  No bony pains.  No nausea no vomiting.  Here for further follow-up and treatment consideration  According to patient's she has noticed an mass in the left breast is softer REVIEW OF SYSTEMS:   GENERAL: Patient is not in any acute distress PERFORMANCE STATUS (ECOG): 01 HEENT:  No visual changes, runny nose, sore throat, mouth sores or tenderness. Lungs: Has a history of COPD.  Using inhaler.  On oxygen. Cardiac:  No chest pain, palpitations, orthopnea, or PND. GI:  No nausea,  vomiting, diarrhea, constipation, melena or hematochezia. GU:  No urgency, frequency, dysuria, or hematuria. Musculoskeletal:  Patient started having increasing back pain for last year or so has been on meloxicam and muscle relaxant. Extremities:  No pain or swelling. Skin:  No rashes or skin changes. Neuro:  No headache, numbness or weakness, balance or coordination issues. Endocrine:  No diabetes, thyroid issues, hot flashes or night sweats. Psych:  No mood changes, depression or anxiety. Pain:  No focal pain. Review of systems:  All other systems reviewed and found to be negative.  As per HPI. Otherwise, a complete review of systems is negatve.  PAST MEDICAL HISTORY: Past Medical History  Diagnosis Date  . Hypertension   . Diabetes mellitus without complication ([place])   . GERD (gastroesophageal reflux disease)   . Breast cancer ([place]) 2009    RT MASTECTOMY  . Cancer of left breast ([place]) 08/09/2015    PAST SURGICAL HISTORY: Past Surgical History  Procedure Laterality Date  . Tubal ligation    . Breast surgery Right 2015    mastectomy  . Abdominal hysterectomy      FAMILY HISTORY Family History  Problem Relation Age of Onset  . Breast cancer Neg Hx          ADVANCED DIRECTIVES:  Patient does not have any living will or healthcare power of attorney.  Information was given .  Available resources had been discussed.  We will follow-up on subsequent appointments regarding this issue  HEALTH MAINTENANCE: Social History  Substance Use Topics  . Smoking status: Current Every Day Smoker -- 1.00  packs/day for 15 years    Types: Cigarettes  . Smokeless tobacco: None  . Alcohol Use: No   No Known Allergies  Current Outpatient Prescriptions  Medication Sig Dispense Refill  . ADVAIR DISKUS 500-50 MCG/DOSE AEPB     . aspirin 81 MG tablet Take 81 mg by mouth daily.    . calcium-vitamin D 250-100 MG-UNIT tablet Take 1 tablet by mouth 2 (two) times daily.    . CHANTIX 1 MG  tablet Take 1 mg by mouth daily.     . cyclobenzaprine (FLEXERIL) 10 MG tablet Take 10 mg by mouth 2 (two) times daily.     . fluticasone (FLONASE) 50 MCG/ACT nasal spray Place 2 sprays into both nostrils daily.    [name] GLIPIZIDE XL 2.5 MG 24 hr tablet Take 2.5 mg by mouth daily with breakfast.     . hydrochlorothiazide (MICROZIDE) 12.5 MG capsule Take 12.5 mg by mouth daily.     [name] letrozole (FEMARA) 2.5 MG tablet Take 1 tablet (2.5 mg total) by mouth daily. 90 tablet 3  . lisinopril (PRINIVIL,ZESTRIL) 40 MG tablet Take 40 mg by mouth daily.     . meloxicam (MOBIC) 7.5 MG tablet Take 7.5 mg by mouth daily.     . montelukast (SINGULAIR) 10 MG tablet Take 10 mg by mouth at bedtime.    [name] omeprazole (PRILOSEC) 20 MG capsule Take 20 mg by mouth daily.     . OXYGEN Inhale into the lungs.    . palbociclib (IBRANCE) 125 MG capsule Take 1 capsule (125 mg total) by mouth daily with breakfast. Take whole with food. (Patient taking differently: Take 125 mg by mouth daily with breakfast. Take whole with food. Take for 21 days then 7 days off.) 21 capsule 3  . SPIRIVA HANDIHALER 18 MCG inhalation capsule Place 18 mcg into inhaler and inhale.      No current facility-administered medications for this visit.    OBJECTIVE: PHYSICAL EXAM: Patient is alert oriented not in any acute distress.  Head exam was generally normal. There was no scleral icterus or corneal arcus. Mucous membranes were moist. Examination of neck: No palpable lymphadenopathy.  Thyroid within normal limit Chest: Emphysematous chest.  Patient is on oxygen.  Coarse crepitation. Cardiac exam revealed the PMI to be normally situated and sized. The rhythm was regular and no extrasystoles were noted during several minutes of auscultation. The first and second heart sounds were normal and physiologic splitting of the second heart sound was noted. There were no murmurs, rubs, clicks, or gallops. Abdominal exam revealed normal bowel sounds. The  abdomen was soft, non-tender, and without masses, organomegaly, or appreciable enlargement of the abdominal aorta. Musculoskeletal system tenderness in mid back area.  A no other abnormality detected Examination of the skin revealed no evidence of significant rashes, suspicious appearing nevi or other concerning lesions. Neurologically, the patient was awake, alert, and oriented to person, place and time. There were no obvious focal neurologic abnormalities.  Right breast: Status post mastectomy no evidence of recurrent disease.  Left breast hard mass.  Palpable mass almost involving the whole breast.  Skin shows some redness.  Palpable left axillary lymphadenopathy .  Mass in the lymph node enlargement is decreased in size Filed Vitals:   10/17/15 1434  BP: 119/60  Pulse: 103  Temp: 97.2 F (36.2 C)  Resp: 18     Body mass index is 29.6 kg/(m^2).    ECOG FS:1 - Symptomatic but completely ambulatory  LAB RESULTS:  Appointment on 10/17/2015  Component Date Value Ref Range Status  . WBC 10/17/2015 6.7  3.6 - 11.0 K/uL Final  . RBC 10/17/2015 3.58* 3.80 - 5.20 MIL/uL Final  . Hemoglobin 10/17/2015 10.4* 12.0 - 16.0 g/dL Final  . HCT 10/17/2015 31.1* 35.0 - 47.0 % Final  . MCV 10/17/2015 87.0  80.0 - 100.0 fL Final  . MCH 10/17/2015 29.0  26.0 - 34.0 pg Final  . MCHC 10/17/2015 33.3  32.0 - 36.0 g/dL Final  . RDW 10/17/2015 22.1* 11.5 - 14.5 % Final  . Platelets 10/17/2015 297  150 - 440 K/uL Final  . Neutrophils Relative % 10/17/2015 48   Final  . Neutro Abs 10/17/2015 3.2  1.4 - 6.5 K/uL Final  . Lymphocytes Relative 10/17/2015 37   Final  . Lymphs Abs 10/17/2015 2.5  1.0 - 3.6 K/uL Final  . Monocytes Relative 10/17/2015 11   Final  . Monocytes Absolute 10/17/2015 0.7  0.2 - 0.9 K/uL Final  . Eosinophils Relative 10/17/2015 3   Final  . Eosinophils Absolute 10/17/2015 0.2  0 - 0.7 K/uL Final  . Basophils Relative 10/17/2015 1   Final  . Basophils Absolute 10/17/2015 0.0  0 - 0.1  K/uL Final  . Sodium 10/17/2015 137  135 - 145 mmol/L Final  . Potassium 10/17/2015 4.4  3.5 - 5.1 mmol/L Final  . Chloride 10/17/2015 104  101 - 111 mmol/L Final  . CO2 10/17/2015 29  22 - 32 mmol/L Final  . Glucose, Bld 10/17/2015 105* 65 - 99 mg/dL Final  . BUN 10/17/2015 18  6 - 20 mg/dL Final  . Creatinine, Ser 10/17/2015 0.95  0.44 - 1.00 mg/dL Final  . Calcium 10/17/2015 9.9  8.9 - 10.3 mg/dL Final  . Total Protein 10/17/2015 7.4  6.5 - 8.1 g/dL Final  . Albumin 10/17/2015 4.0  3.5 - 5.0 g/dL Final  . AST 10/17/2015 13* 15 - 41 U/L Final  . ALT 10/17/2015 9* 14 - 54 U/L Final  . Alkaline Phosphatase 10/17/2015 68  38 - 126 U/L Final  . Total Bilirubin 10/17/2015 0.3  0.3 - 1.2 mg/dL Final  . GFR calc non Af Amer 10/17/2015 >60  >60 mL/min Final  . GFR calc Af Amer 10/17/2015 >60  >60 mL/min Final   Comment: (NOTE) The eGFR has been calculated using the CKD EPI equation. This calculation has not been validated in all clinical situations. eGFR's persistently <60 mL/min signify possible Chronic Kidney Disease.   . Anion gap 10/17/2015 4* 5 - 15 Final     STUDIES: No results found.  ASSESSMENT: Locally advanced carcinoma of breast (left) Biopsies consistent with lobular carcinoma.  Skin biopsies negative.  Hormone receptor and HER-2/neu receptor pending. Patient  has  palpable lymphadenopathy She  has been started on Femara and IBRANCE tolerating very well. No significant side effect.  No evidence of myelosuppression. Left  breast mass is slightly soft.  Will continue anti-hormonal therapy and reevaluate patient in one month  2.  Previous history of carcinoma of right breast status post mastectomy done at [facility] in 2009  stage was not known that was no follow-up mammogram or anti-hormonal therapy  Comorbid conditions include COPD.  Continuing smoking.  Non-insulin-dependent diabetes. Hypertension.  PLAN: I  Patient is tolerating treatment very well.  The  IBRANCE letrozole All lab data has been reviewed Proceed with second cycle of IBRANCE letrozole.  After 3 cycles we will repeat PET scan tumor markers to decide  further line of treatment This has been discussed with the patient and family   No matching staging information was found for the patient.  Forest Gleason, MD   10/17/2015 2:45 PM

## 2015-10-18 LAB — CANCER ANTIGEN 27.29: CA 27.29: 39.2 U/mL — ABNORMAL HIGH (ref 0.0–38.6)

## 2015-11-14 ENCOUNTER — Ambulatory Visit: Payer: Medicare HMO | Admitting: Oncology

## 2015-11-14 ENCOUNTER — Other Ambulatory Visit: Payer: Medicare HMO

## 2015-11-18 ENCOUNTER — Inpatient Hospital Stay (HOSPITAL_BASED_OUTPATIENT_CLINIC_OR_DEPARTMENT_OTHER): Payer: Medicare HMO | Admitting: Oncology

## 2015-11-18 ENCOUNTER — Inpatient Hospital Stay: Payer: Medicare HMO | Attending: Oncology

## 2015-11-18 ENCOUNTER — Encounter: Payer: Self-pay | Admitting: Oncology

## 2015-11-18 VITALS — BP 112/72 | HR 98 | Temp 97.1°F | Resp 18 | Wt 172.0 lb

## 2015-11-18 DIAGNOSIS — F17210 Nicotine dependence, cigarettes, uncomplicated: Secondary | ICD-10-CM | POA: Insufficient documentation

## 2015-11-18 DIAGNOSIS — N63 Unspecified lump in breast: Secondary | ICD-10-CM | POA: Diagnosis not present

## 2015-11-18 DIAGNOSIS — Z170 Estrogen receptor positive status [ER+]: Secondary | ICD-10-CM | POA: Insufficient documentation

## 2015-11-18 DIAGNOSIS — J449 Chronic obstructive pulmonary disease, unspecified: Secondary | ICD-10-CM | POA: Diagnosis not present

## 2015-11-18 DIAGNOSIS — K219 Gastro-esophageal reflux disease without esophagitis: Secondary | ICD-10-CM | POA: Diagnosis not present

## 2015-11-18 DIAGNOSIS — Z9011 Acquired absence of right breast and nipple: Secondary | ICD-10-CM | POA: Insufficient documentation

## 2015-11-18 DIAGNOSIS — C50912 Malignant neoplasm of unspecified site of left female breast: Secondary | ICD-10-CM

## 2015-11-18 DIAGNOSIS — Z7982 Long term (current) use of aspirin: Secondary | ICD-10-CM

## 2015-11-18 DIAGNOSIS — Z853 Personal history of malignant neoplasm of breast: Secondary | ICD-10-CM

## 2015-11-18 DIAGNOSIS — Z79899 Other long term (current) drug therapy: Secondary | ICD-10-CM | POA: Insufficient documentation

## 2015-11-18 DIAGNOSIS — Z79811 Long term (current) use of aromatase inhibitors: Secondary | ICD-10-CM | POA: Insufficient documentation

## 2015-11-18 DIAGNOSIS — E119 Type 2 diabetes mellitus without complications: Secondary | ICD-10-CM

## 2015-11-18 DIAGNOSIS — I10 Essential (primary) hypertension: Secondary | ICD-10-CM | POA: Insufficient documentation

## 2015-11-18 LAB — CBC WITH DIFFERENTIAL/PLATELET
BASOS ABS: 0.2 10*3/uL — AB (ref 0–0.1)
Basophils Relative: 3 %
Eosinophils Absolute: 0.1 10*3/uL (ref 0–0.7)
Eosinophils Relative: 2 %
HCT: 29 % — ABNORMAL LOW (ref 35.0–47.0)
HEMOGLOBIN: 9.8 g/dL — AB (ref 12.0–16.0)
LYMPHS ABS: 2.4 10*3/uL (ref 1.0–3.6)
LYMPHS PCT: 40 %
MCH: 31.2 pg (ref 26.0–34.0)
MCHC: 33.7 g/dL (ref 32.0–36.0)
MCV: 92.6 fL (ref 80.0–100.0)
Monocytes Absolute: 0.9 10*3/uL (ref 0.2–0.9)
Monocytes Relative: 14 %
NEUTROS PCT: 41 %
Neutro Abs: 2.5 10*3/uL (ref 1.4–6.5)
Platelets: 280 10*3/uL (ref 150–440)
RBC: 3.13 MIL/uL — AB (ref 3.80–5.20)
RDW: 24.3 % — ABNORMAL HIGH (ref 11.5–14.5)
WBC: 6 10*3/uL (ref 3.6–11.0)

## 2015-11-18 LAB — COMPREHENSIVE METABOLIC PANEL
ALK PHOS: 66 U/L (ref 38–126)
ALT: 10 U/L — AB (ref 14–54)
AST: 17 U/L (ref 15–41)
Albumin: 3.8 g/dL (ref 3.5–5.0)
Anion gap: 4 — ABNORMAL LOW (ref 5–15)
BUN: 10 mg/dL (ref 6–20)
CALCIUM: 9.2 mg/dL (ref 8.9–10.3)
CO2: 29 mmol/L (ref 22–32)
Chloride: 107 mmol/L (ref 101–111)
Creatinine, Ser: 0.75 mg/dL (ref 0.44–1.00)
Glucose, Bld: 124 mg/dL — ABNORMAL HIGH (ref 65–99)
Potassium: 3.6 mmol/L (ref 3.5–5.1)
SODIUM: 140 mmol/L (ref 135–145)
Total Bilirubin: 0.3 mg/dL (ref 0.3–1.2)
Total Protein: 7 g/dL (ref 6.5–8.1)

## 2015-11-18 NOTE — Progress Notes (Signed)
[place] @ [facility] Telephone:(336) [phone]  Fax:(336) [place]: 17-Aug-1946  MR#: [id number]  DUK#:[id number]  Patient Care Team: Donnie Coffin, MD as PCP - General (Family Medicine) Donnie Coffin, MD as Referring Physician (Family Medicine) Seeplaputhur Robinette Haines, MD (General Surgery)  CHIEF COMPLAINT:  Chief Complaint  Patient presents with  . 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  HER-2/neu receptor negative Clinically stage is T4 N1 M1 tumor Stage IV disease 2.  Previous history of carcinoma of right breast status post mastectomy was done at [place] not known Cording to patient she did not have any follow-up treatment. 3.  Patient has been started on letrozole and IBRANCE from December, 2016  VISIT DIAGNOSIS:  Cancer of left breast  No flowsheet data found.  INTERVAL HISTORY: 70 year old lady who has a previous history of non-insulin-dependent diabetes.  And hypertension.  Patient had a very poor follow-up regarding her right breast carcinoma for which patient had mastectomy done at [facility].  Patient does not report to have any mammogram except for one mammogram 2 years ago when patient was told that has not in duration and needs to be evaluated.  Recently starting second cycle of treatment with IBRANCE and letrozole.  Tolerated treatment very well.  No bony pains.  No nausea no vomiting.  Here for further follow-up and treatment consideration  According to patient's she has noticed an mass in the left breast is softer.  Patient is here for ongoing evaluation and treatment consideration.  Tolerating   IBRANCE and letrozole.  No bony pains.  Appetite has been stable.  No rash REVIEW OF SYSTEMS:   GENERAL: Patient is not in any acute distress PERFORMANCE STATUS (ECOG): 01 HEENT:  No visual changes, runny nose, sore throat,  mouth sores or tenderness. Lungs: Has a history of COPD.  Using inhaler.  On oxygen. Cardiac:  No chest pain, palpitations, orthopnea, or PND. GI:  No nausea, vomiting, diarrhea, constipation, melena or hematochezia. GU:  No urgency, frequency, dysuria, or hematuria. Musculoskeletal:  Patient started having increasing back pain for last year or so has been on meloxicam and muscle relaxant. Extremities:  No pain or swelling. Skin:  No rashes or skin changes. Neuro:  No headache, numbness or weakness, balance or coordination issues. Endocrine:  No diabetes, thyroid issues, hot flashes or night sweats. Psych:  No mood changes, depression or anxiety. Pain:  No focal pain. Review of systems:  All other systems reviewed and found to be negative.  As per HPI. Otherwise, a complete review of systems is negatve.  PAST MEDICAL HISTORY: Past Medical History  Diagnosis Date  . Hypertension   . Diabetes mellitus without complication ([place])   . GERD (gastroesophageal reflux disease)   . Breast cancer ([place]) 2009    RT MASTECTOMY  . Cancer of left breast ([place]) 08/09/2015    PAST SURGICAL HISTORY: Past Surgical History  Procedure Laterality Date  . Tubal ligation    . Breast surgery Right 2015    mastectomy  . Abdominal hysterectomy      FAMILY HISTORY Family History  Problem Relation Age of Onset  . Breast cancer Neg Hx          ADVANCED DIRECTIVES:  Patient does not have any living will or healthcare power of attorney.  Information was given .  Available resources had been discussed.  We will follow-up on subsequent appointments regarding this issue  HEALTH MAINTENANCE: Social History  Substance Use Topics  . Smoking status: Current Every Day Smoker -- 1.00 packs/day for 15 years    Types: Cigarettes  . Smokeless tobacco: None  . Alcohol Use: No   No Known Allergies  Current Outpatient Prescriptions  Medication Sig Dispense Refill  . ADVAIR DISKUS 500-50 MCG/DOSE AEPB       . aspirin 81 MG tablet Take 81 mg by mouth daily.    . calcium-vitamin D 250-100 MG-UNIT tablet Take 1 tablet by mouth 2 (two) times daily.    . CHANTIX 1 MG tablet Take 1 mg by mouth daily.     . cyclobenzaprine (FLEXERIL) 10 MG tablet Take 10 mg by mouth 2 (two) times daily.     . fluticasone (FLONASE) 50 MCG/ACT nasal spray Place 2 sprays into both nostrils daily.    [name] GLIPIZIDE XL 2.5 MG 24 hr tablet Take 2.5 mg by mouth daily with breakfast.     . hydrochlorothiazide (MICROZIDE) 12.5 MG capsule Take 12.5 mg by mouth daily.     [name] letrozole (FEMARA) 2.5 MG tablet Take 1 tablet (2.5 mg total) by mouth daily. 90 tablet 3  . lisinopril (PRINIVIL,ZESTRIL) 40 MG tablet Take 40 mg by mouth daily.     . meloxicam (MOBIC) 7.5 MG tablet Take 7.5 mg by mouth daily.     . montelukast (SINGULAIR) 10 MG tablet Take 10 mg by mouth at bedtime.    [name] omeprazole (PRILOSEC) 20 MG capsule Take 20 mg by mouth daily.     . OXYGEN Inhale into the lungs.    . palbociclib (IBRANCE) 125 MG capsule Take 1 capsule (125 mg total) by mouth daily with breakfast. Take whole with food. (Patient taking differently: Take 125 mg by mouth daily with breakfast. Take whole with food. Take for 21 days then 7 days off.) 21 capsule 3  . SPIRIVA HANDIHALER 18 MCG inhalation capsule Place 18 mcg into inhaler and inhale.      No current facility-administered medications for this visit.    OBJECTIVE: PHYSICAL EXAM: Patient is alert oriented not in any acute distress.  Head exam was generally normal. There was no scleral icterus or corneal arcus. Mucous membranes were moist. Examination of neck: No palpable lymphadenopathy.  Thyroid within normal limit Chest: Emphysematous chest.  Patient is on oxygen.  Coarse crepitation. Cardiac exam revealed the PMI to be normally situated and sized. The rhythm was regular and no extrasystoles were noted during several minutes of auscultation. The first and second heart sounds were normal and  physiologic splitting of the second heart sound was noted. There were no murmurs, rubs, clicks, or gallops. Abdominal exam revealed normal bowel sounds. The abdomen was soft, non-tender, and without masses, organomegaly, or appreciable enlargement of the abdominal aorta. Musculoskeletal system tenderness in mid back area.  A no other abnormality detected Examination of the skin revealed no evidence of significant rashes, suspicious appearing nevi or other concerning lesions. Neurologically, the patient was awake, alert, and oriented to person, place and time. There were no obvious focal neurologic abnormalities.  Right breast: Status post mastectomy no evidence of recurrent disease.  Left breast hard mass.  Palpable mass almost involving the whole breast.  Skin shows some redness.  Palpable left axillary lymphadenopathy .  Mass in the lymph node enlargement is decreased in size Filed Vitals:   11/18/15 1558  BP: 112/72  Pulse: 98  Temp: 97.1 F (  36.2 C)  Resp: 18     Body mass index is 30.48 kg/(m^2).    ECOG FS:1 - Symptomatic but completely ambulatory  LAB RESULTS:  Appointment on 11/18/2015  Component Date Value Ref Range Status  . WBC 11/18/2015 6.0  3.6 - 11.0 K/uL Final  . RBC 11/18/2015 3.13* 3.80 - 5.20 MIL/uL Final  . Hemoglobin 11/18/2015 9.8* 12.0 - 16.0 g/dL Final  . HCT 11/18/2015 29.0* 35.0 - 47.0 % Final  . MCV 11/18/2015 92.6  80.0 - 100.0 fL Final  . MCH 11/18/2015 31.2  26.0 - 34.0 pg Final  . MCHC 11/18/2015 33.7  32.0 - 36.0 g/dL Final  . RDW 11/18/2015 24.3* 11.5 - 14.5 % Final  . Platelets 11/18/2015 280  150 - 440 K/uL Final  . Neutrophils Relative % 11/18/2015 41   Final  . Neutro Abs 11/18/2015 2.5  1.4 - 6.5 K/uL Final  . Lymphocytes Relative 11/18/2015 40   Final  . Lymphs Abs 11/18/2015 2.4  1.0 - 3.6 K/uL Final  . Monocytes Relative 11/18/2015 14   Final  . Monocytes Absolute 11/18/2015 0.9  0.2 - 0.9 K/uL Final  . Eosinophils Relative 11/18/2015 2    Final  . Eosinophils Absolute 11/18/2015 0.1  0 - 0.7 K/uL Final  . Basophils Relative 11/18/2015 3   Final  . Basophils Absolute 11/18/2015 0.2* 0 - 0.1 K/uL Final  . Sodium 11/18/2015 140  135 - 145 mmol/L Final  . Potassium 11/18/2015 3.6  3.5 - 5.1 mmol/L Final  . Chloride 11/18/2015 107  101 - 111 mmol/L Final  . CO2 11/18/2015 29  22 - 32 mmol/L Final  . Glucose, Bld 11/18/2015 124* 65 - 99 mg/dL Final  . BUN 11/18/2015 10  6 - 20 mg/dL Final  . Creatinine, Ser 11/18/2015 0.75  0.44 - 1.00 mg/dL Final  . Calcium 11/18/2015 9.2  8.9 - 10.3 mg/dL Final  . Total Protein 11/18/2015 7.0  6.5 - 8.1 g/dL Final  . Albumin 11/18/2015 3.8  3.5 - 5.0 g/dL Final  . AST 11/18/2015 17  15 - 41 U/L Final  . ALT 11/18/2015 10* 14 - 54 U/L Final  . Alkaline Phosphatase 11/18/2015 66  38 - 126 U/L Final  . Total Bilirubin 11/18/2015 0.3  0.3 - 1.2 mg/dL Final  . GFR calc non Af Amer 11/18/2015 >60  >60 mL/min Final  . GFR calc Af Amer 11/18/2015 >60  >60 mL/min Final   Comment: (NOTE) The eGFR has been calculated using the CKD EPI equation. This calculation has not been validated in all clinical situations. eGFR's persistently <60 mL/min signify possible Chronic Kidney Disease.   . Anion gap 11/18/2015 4* 5 - 15 Final     STUDIES: No results found.  ASSESSMENT: Locally advanced carcinoma of breast (left) Biopsies consistent with lobular carcinoma.  Skin biopsies negative.  Hormone receptor and HER-2/neu receptor pending. Patient  has  palpable lymphadenopathy She  has been started on Femara and IBRANCE tolerating very well. No significant side effect.  No evidence of myelosuppression. Left  breast mass is slightly soft.  Will continue anti-hormonal therapy and reevaluate patient in one month  2.  Previous history of carcinoma of right breast status post mastectomy done at [facility] in 2009  stage was not known that was no follow-up mammogram or anti-hormonal therapy  Comorbid  conditions include COPD.  Continuing smoking.  Non-insulin-dependent diabetes. Hypertension.  PLAN: I  Patient is tolerating treatment very well.  The IBRANCE letrozole All lab data has been reviewed Proceed with second cycle of IBRANCE letrozole.  After 3 cycles we will repeat PET scan tumor markers to decide further line of treatment Tolerating IBRANCE letrozole. Evaluate patient with another PET scan prior to next appointment to evaluate for response  Tumor markers shows declining trend. CA 27.29 is 26..4,  3   months ago was 88.2    Forest Gleason, MD   11/18/2015 4:05 PM

## 2015-11-19 ENCOUNTER — Encounter: Payer: Self-pay | Admitting: Oncology

## 2015-11-19 LAB — CANCER ANTIGEN 27.29: CA 27.29: 26.4 U/mL (ref 0.0–38.6)

## 2015-11-25 ENCOUNTER — Telehealth: Payer: Self-pay | Admitting: *Deleted

## 2015-11-25 DIAGNOSIS — C50912 Malignant neoplasm of unspecified site of left female breast: Secondary | ICD-10-CM

## 2015-11-25 MED ORDER — PALBOCICLIB 125 MG PO CAPS: 125.0000 mg | ORAL_CAPSULE | Freq: Every day | ORAL | Status: DC

## 2015-11-25 NOTE — Telephone Encounter (Signed)
Refill for ibrance sent to [place].

## 2015-12-20 ENCOUNTER — Ambulatory Visit
Admission: RE | Admit: 2015-12-20 | Discharge: 2015-12-20 | Disposition: A | Discharge status: Home / Self Care | Payer: Medicare HMO | Source: Ambulatory Visit | Attending: Oncology | Admitting: Oncology

## 2015-12-20 DIAGNOSIS — I2510 Atherosclerotic heart disease of native coronary artery without angina pectoris: Secondary | ICD-10-CM | POA: Diagnosis not present

## 2015-12-20 DIAGNOSIS — C50912 Malignant neoplasm of unspecified site of left female breast: Secondary | ICD-10-CM

## 2015-12-20 DIAGNOSIS — I7090 Unspecified atherosclerosis: Secondary | ICD-10-CM | POA: Diagnosis not present

## 2015-12-20 LAB — GLUCOSE, CAPILLARY: GLUCOSE-CAPILLARY: 149 mg/dL — AB (ref 65–99)

## 2015-12-20 MED ORDER — FLUDEOXYGLUCOSE F - 18 (FDG) INJECTION
11.9780 | Freq: Once | INTRAVENOUS | Status: AC | PRN
  Administered 2015-12-20: 11.978 via INTRAVENOUS

## 2015-12-29 ENCOUNTER — Inpatient Hospital Stay: Payer: Medicare HMO | Attending: Oncology

## 2015-12-29 ENCOUNTER — Inpatient Hospital Stay (HOSPITAL_BASED_OUTPATIENT_CLINIC_OR_DEPARTMENT_OTHER): Payer: Medicare HMO | Admitting: Oncology

## 2015-12-29 VITALS — BP 110/70 | HR 102 | Temp 97.2°F | Wt 171.3 lb

## 2015-12-29 DIAGNOSIS — R590 Localized enlarged lymph nodes: Secondary | ICD-10-CM | POA: Diagnosis not present

## 2015-12-29 DIAGNOSIS — N63 Unspecified lump in breast: Secondary | ICD-10-CM | POA: Insufficient documentation

## 2015-12-29 DIAGNOSIS — F17210 Nicotine dependence, cigarettes, uncomplicated: Secondary | ICD-10-CM | POA: Insufficient documentation

## 2015-12-29 DIAGNOSIS — I10 Essential (primary) hypertension: Secondary | ICD-10-CM | POA: Insufficient documentation

## 2015-12-29 DIAGNOSIS — Z79899 Other long term (current) drug therapy: Secondary | ICD-10-CM | POA: Insufficient documentation

## 2015-12-29 DIAGNOSIS — Z9981 Dependence on supplemental oxygen: Secondary | ICD-10-CM | POA: Diagnosis not present

## 2015-12-29 DIAGNOSIS — Z170 Estrogen receptor positive status [ER+]: Secondary | ICD-10-CM | POA: Insufficient documentation

## 2015-12-29 DIAGNOSIS — C50912 Malignant neoplasm of unspecified site of left female breast: Secondary | ICD-10-CM

## 2015-12-29 DIAGNOSIS — Z853 Personal history of malignant neoplasm of breast: Secondary | ICD-10-CM | POA: Diagnosis not present

## 2015-12-29 DIAGNOSIS — E119 Type 2 diabetes mellitus without complications: Secondary | ICD-10-CM | POA: Insufficient documentation

## 2015-12-29 DIAGNOSIS — Z7982 Long term (current) use of aspirin: Secondary | ICD-10-CM | POA: Insufficient documentation

## 2015-12-29 DIAGNOSIS — M549 Dorsalgia, unspecified: Secondary | ICD-10-CM | POA: Insufficient documentation

## 2015-12-29 DIAGNOSIS — Z9011 Acquired absence of right breast and nipple: Secondary | ICD-10-CM | POA: Diagnosis not present

## 2015-12-29 DIAGNOSIS — Z79811 Long term (current) use of aromatase inhibitors: Secondary | ICD-10-CM | POA: Diagnosis not present

## 2015-12-29 DIAGNOSIS — Z85830 Personal history of malignant neoplasm of bone: Secondary | ICD-10-CM

## 2015-12-29 DIAGNOSIS — J449 Chronic obstructive pulmonary disease, unspecified: Secondary | ICD-10-CM

## 2015-12-29 DIAGNOSIS — K219 Gastro-esophageal reflux disease without esophagitis: Secondary | ICD-10-CM | POA: Diagnosis not present

## 2015-12-29 LAB — CBC WITH DIFFERENTIAL/PLATELET
BASOS ABS: 0 10*3/uL (ref 0–0.1)
Basophils Relative: 1 %
Eosinophils Absolute: 0.3 10*3/uL (ref 0–0.7)
Eosinophils Relative: 4 %
HEMATOCRIT: 30.8 % — AB (ref 35.0–47.0)
HEMOGLOBIN: 10.2 g/dL — AB (ref 12.0–16.0)
LYMPHS PCT: 24 %
Lymphs Abs: 1.7 10*3/uL (ref 1.0–3.6)
MCH: 32.1 pg (ref 26.0–34.0)
MCHC: 33.2 g/dL (ref 32.0–36.0)
MCV: 96.5 fL (ref 80.0–100.0)
Monocytes Absolute: 0.3 10*3/uL (ref 0.2–0.9)
Monocytes Relative: 5 %
NEUTROS ABS: 4.9 10*3/uL (ref 1.4–6.5)
NEUTROS PCT: 66 %
Platelets: 486 10*3/uL — ABNORMAL HIGH (ref 150–440)
RBC: 3.19 MIL/uL — AB (ref 3.80–5.20)
RDW: 16.9 % — ABNORMAL HIGH (ref 11.5–14.5)
WBC: 7.3 10*3/uL (ref 3.6–11.0)

## 2015-12-29 LAB — COMPREHENSIVE METABOLIC PANEL
ALT: 12 U/L — AB (ref 14–54)
AST: 20 U/L (ref 15–41)
Albumin: 4 g/dL (ref 3.5–5.0)
Alkaline Phosphatase: 66 U/L (ref 38–126)
Anion gap: 8 (ref 5–15)
BILIRUBIN TOTAL: 0.3 mg/dL (ref 0.3–1.2)
BUN: 21 mg/dL — AB (ref 6–20)
CHLORIDE: 103 mmol/L (ref 101–111)
CO2: 27 mmol/L (ref 22–32)
CREATININE: 1.35 mg/dL — AB (ref 0.44–1.00)
Calcium: 9.5 mg/dL (ref 8.9–10.3)
GFR calc Af Amer: 46 mL/min — ABNORMAL LOW (ref >60)
GFR, EST NON AFRICAN AMERICAN: 40 mL/min — AB (ref >60)
GLUCOSE: 111 mg/dL — AB (ref 65–99)
Potassium: 4.4 mmol/L (ref 3.5–5.1)
Sodium: 138 mmol/L (ref 135–145)
TOTAL PROTEIN: 7.5 g/dL (ref 6.5–8.1)

## 2016-01-01 ENCOUNTER — Encounter: Payer: Self-pay | Admitting: Oncology

## 2016-01-01 NOTE — Progress Notes (Signed)
[place] @ [facility] Telephone:([phone]) [phone]  Fax:([phone]) [place]: Jul 17, 1946  MR#: [id number]  OMV#:[id number]  Patient Care Team: Donnie Coffin, MD as PCP - General (Family Medicine) Donnie Coffin, MD as Referring Physician (Family Medicine) Seeplaputhur Robinette Haines, MD (General Surgery)  CHIEF COMPLAINT:  Chief Complaint  Patient presents with  . 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  HER-2/neu receptor negative Clinically stage is T4 N1 M1 tumor Stage IV disease 2.  Previous history of carcinoma of right breast status post mastectomy was done at [place] not known Cording to patient she did not have any follow-up treatment. 3.  Patient has been started on letrozole and IBRANCE from December, 2016  VISIT DIAGNOSIS:  Cancer of left breast  No flowsheet data found.  INTERVAL HISTORY: 70 year old lady who has a previous history of non-insulin-dependent diabetes.  And hypertension.  Patient had a very poor follow-up regarding her right breast carcinoma for which patient had mastectomy done at [facility].  Patient does not report to have any mammogram except for one mammogram 2 years ago when patient was told that has not in duration and needs to be evaluated.  Recently starting second cycle of treatment with IBRANCE and letrozole.  Tolerated treatment very well.  No bony pains.  No nausea no vomiting.  Here for further follow-up and treatment consideration  According to patient's she has noticed an mass in the left breast is softer. Patient is tolerating treatment very well without any significant nausea vomiting diarrhea or fever.   No Bony pains. REVIEW OF SYSTEMS:   GENERAL: Patient is not in any acute distress PERFORMANCE STATUS (ECOG): 01 HEENT:  No visual changes, runny nose, sore  throat, mouth sores or tenderness. Lungs: Has a history of COPD.  Using inhaler.  On oxygen. Cardiac:  No chest pain, palpitations, orthopnea, or PND. GI:  No nausea, vomiting, diarrhea, constipation, melena or hematochezia. GU:  No urgency, frequency, dysuria, or hematuria. Musculoskeletal:  Patient started having increasing back pain for last year or so has been on meloxicam and muscle relaxant. Extremities:  No pain or swelling. Skin:  No rashes or skin changes. Neuro:  No headache, numbness or weakness, balance or coordination issues. Endocrine:  No diabetes, thyroid issues, hot flashes or night sweats. Psych:  No mood changes, depression or anxiety. Pain:  No focal pain. Review of systems:  All other systems reviewed and found to be negative.  As per HPI. Otherwise, a complete review of systems is negatve.  PAST MEDICAL HISTORY: Past Medical History  Diagnosis Date  . Hypertension   . Diabetes mellitus without complication ([place])   . GERD (gastroesophageal reflux disease)   . Breast cancer ([place]) 2009    RT MASTECTOMY  . Cancer of left breast ([place]) 08/09/2015    PAST SURGICAL HISTORY: Past Surgical History  Procedure Laterality Date  . Tubal ligation    . Breast surgery Right 2015    mastectomy  . Abdominal hysterectomy      FAMILY HISTORY Family History  Problem Relation Age of Onset  . Breast cancer Neg Hx          ADVANCED DIRECTIVES:  Patient does not have any living will or healthcare power of attorney.  Information was given .  Available resources had been discussed.  We will  follow-up on subsequent appointments regarding this issue  HEALTH MAINTENANCE: Social History  Substance Use Topics  . Smoking status: Current Every Day Smoker -- 1.00 packs/day for 15 years    Types: Cigarettes  . Smokeless tobacco: None  . Alcohol Use: No   No Known Allergies  Current Outpatient Prescriptions  Medication Sig Dispense Refill  . ADVAIR DISKUS 500-50 MCG/DOSE  AEPB     . aspirin 81 MG tablet Take 81 mg by mouth daily.    . calcium-vitamin D 250-100 MG-UNIT tablet Take 1 tablet by mouth 2 (two) times daily.    . CHANTIX 1 MG tablet Take 1 mg by mouth daily.     . cyclobenzaprine (FLEXERIL) 10 MG tablet Take 10 mg by mouth 2 (two) times daily.     . fluticasone (FLONASE) 50 MCG/ACT nasal spray Place 2 sprays into both nostrils daily.    [name] GLIPIZIDE XL 2.5 MG 24 hr tablet Take 2.5 mg by mouth daily with breakfast.     . hydrochlorothiazide (MICROZIDE) 12.5 MG capsule Take 12.5 mg by mouth daily.     [name] letrozole (FEMARA) 2.5 MG tablet Take 1 tablet (2.5 mg total) by mouth daily. 90 tablet 3  . lisinopril (PRINIVIL,ZESTRIL) 40 MG tablet Take 40 mg by mouth daily.     . meloxicam (MOBIC) 7.5 MG tablet Take 7.5 mg by mouth daily.     . montelukast (SINGULAIR) 10 MG tablet Take 10 mg by mouth at bedtime.    [name] omeprazole (PRILOSEC) 20 MG capsule Take 20 mg by mouth daily.     . OXYGEN Inhale into the lungs.    . palbociclib (IBRANCE) 125 MG capsule Take 1 capsule (125 mg total) by mouth daily with breakfast. Take whole with food. Take for 21 days then 7 days off. 21 capsule 3  . SPIRIVA HANDIHALER 18 MCG inhalation capsule Place 18 mcg into inhaler and inhale.      No current facility-administered medications for this visit.    OBJECTIVE: PHYSICAL EXAM: Patient is alert oriented not in any acute distress.  Head exam was generally normal. There was no scleral icterus or corneal arcus. Mucous membranes were moist. Examination of neck: No palpable lymphadenopathy.  Thyroid within normal limit Chest: Emphysematous chest.  Patient is on oxygen.  Coarse crepitation. Cardiac exam revealed the PMI to be normally situated and sized. The rhythm was regular and no extrasystoles were noted during several minutes of auscultation. The first and second heart sounds were normal and physiologic splitting of the second heart sound was noted. There were no murmurs, rubs,  clicks, or gallops. Abdominal exam revealed normal bowel sounds. The abdomen was soft, non-tender, and without masses, organomegaly, or appreciable enlargement of the abdominal aorta. Musculoskeletal system tenderness in mid back area.  A no other abnormality detected Examination of the skin revealed no evidence of significant rashes, suspicious appearing nevi or other concerning lesions. Neurologically, the patient was awake, alert, and oriented to person, place and time. There were no obvious focal neurologic abnormalities.  Right breast: Status post mastectomy no evidence of recurrent disease.  Left breast hard mass.  Palpable mass almost involving the whole breast.  Skin shows some redness.  Palpable left axillary lymphadenopathy .  Mass in the lymph node enlargement is decreased in size Filed Vitals:   12/29/15 1523  BP: 110/70  Pulse: 102  Temp: 97.2 F (36.2 C)     Body mass index is 30.35 kg/(m^2).    ECOG FS:1 -  Symptomatic but completely ambulatory  LAB RESULTS:  Appointment on 12/29/2015  Component Date Value Ref Range Status  . WBC 12/29/2015 7.3  3.6 - 11.0 K/uL Final  . RBC 12/29/2015 3.19* 3.80 - 5.20 MIL/uL Final  . Hemoglobin 12/29/2015 10.2* 12.0 - 16.0 g/dL Final  . HCT 12/29/2015 30.8* 35.0 - 47.0 % Final  . MCV 12/29/2015 96.5  80.0 - 100.0 fL Final  . MCH 12/29/2015 32.1  26.0 - 34.0 pg Final  . MCHC 12/29/2015 33.2  32.0 - 36.0 g/dL Final  . RDW 12/29/2015 16.9* 11.5 - 14.5 % Final  . Platelets 12/29/2015 486* 150 - 440 K/uL Final  . Neutrophils Relative % 12/29/2015 66   Final  . Neutro Abs 12/29/2015 4.9  1.4 - 6.5 K/uL Final  . Lymphocytes Relative 12/29/2015 24   Final  . Lymphs Abs 12/29/2015 1.7  1.0 - 3.6 K/uL Final  . Monocytes Relative 12/29/2015 5   Final  . Monocytes Absolute 12/29/2015 0.3  0.2 - 0.9 K/uL Final  . Eosinophils Relative 12/29/2015 4   Final  . Eosinophils Absolute 12/29/2015 0.3  0 - 0.7 K/uL Final  . Basophils Relative 12/29/2015  1   Final  . Basophils Absolute 12/29/2015 0.0  0 - 0.1 K/uL Final  . Sodium 12/29/2015 138  135 - 145 mmol/L Final  . Potassium 12/29/2015 4.4  3.5 - 5.1 mmol/L Final  . Chloride 12/29/2015 103  101 - 111 mmol/L Final  . CO2 12/29/2015 27  22 - 32 mmol/L Final  . Glucose, Bld 12/29/2015 111* 65 - 99 mg/dL Final  . BUN 12/29/2015 21* 6 - 20 mg/dL Final  . Creatinine, Ser 12/29/2015 1.35* 0.44 - 1.00 mg/dL Final  . Calcium 12/29/2015 9.5  8.9 - 10.3 mg/dL Final  . Total Protein 12/29/2015 7.5  6.5 - 8.1 g/dL Final  . Albumin 12/29/2015 4.0  3.5 - 5.0 g/dL Final  . AST 12/29/2015 20  15 - 41 U/L Final  . ALT 12/29/2015 12* 14 - 54 U/L Final  . Alkaline Phosphatase 12/29/2015 66  38 - 126 U/L Final  . Total Bilirubin 12/29/2015 0.3  0.3 - 1.2 mg/dL Final  . GFR calc non Af Amer 12/29/2015 40* >60 mL/min Final  . GFR calc Af Amer 12/29/2015 46* >60 mL/min Final   Comment: (NOTE) The eGFR has been calculated using the CKD EPI equation. This calculation has not been validated in all clinical situations. eGFR's persistently <60 mL/min signify possible Chronic Kidney Disease.   . Anion gap 12/29/2015 8  5 - 15 Final     STUDIES: Nm Pet Image Restag (ps) Skull Base To Thigh  12/20/2015  CLINICAL DATA:  Subsequent treatment strategy for breast cancer. Restaging examination. Patient was originally diagnosed with right-sided breast cancer in 2009, at which point she had a mastectomy. Left-sided breast cancer than diagnosed in December 2016. EXAM: NUCLEAR MEDICINE PET SKULL BASE TO THIGH TECHNIQUE: 12.0 mCi F-18 FDG was injected intravenously. Full-ring PET imaging was performed from the skull base to thigh after the radiotracer. CT data was obtained and used for attenuation correction and anatomic localization. FASTING BLOOD GLUCOSE:  Value: 149 mg/dl COMPARISON:  PET-CT 08/10/2015. FINDINGS: NECK No hypermetabolic lymph nodes in the neck. There is some asymmetric hypermetabolism (SUVmax = 6.1) in  the region of the left pharyngeal tonsil, which is nonspecific. Hypermetabolism in the larynx is likely physiologic as it appears to be symmetric. CHEST Previously noted soft tissue thickening and hypermetabolism in the  left breast has resolved. All previously noted left axillary and left supraclavicular hypermetabolic lymph nodes have also decreased in size and resolved. No hypermetabolic mediastinal or hilar nodes. No suspicious pulmonary nodules on the CT scan. Heart size is normal. There is no significant pericardial fluid, thickening or pericardial calcification. There is atherosclerosis of the thoracic aorta, the great vessels of the mediastinum and the coronary arteries, including calcified atherosclerotic plaque in the left anterior descending, left circumflex and right coronary arteries. Esophagus is unremarkable in appearance. Linear scarring in the periphery of the right lower lobe. Status post right modified radical mastectomy and right axillary lymph node dissection. ABDOMEN/PELVIS There continues to be some low-level hypermetabolism in the adrenal glands bilaterally, however, this has significantly decreased compared to the prior study (SUVmax = 3.2 on the right and 3.6 on the left)), and there is no adrenal nodularity identified. No abnormal hypermetabolic activity within the liver, pancreas or spleen. Small amount of high attenuation material lying dependently in the gallbladder likely represents tiny gallstones. No findings to suggest an acute cholecystitis at this time. Mild bilateral hydronephrosis has decreased compared to the prior study, as has surrounding perinephric stranding. No hypermetabolic lymph nodes in the abdomen or pelvis. Previously noted retroperitoneal lymphadenopathy has resolved. Previously noted right ovarian enlargement and hypermetabolism has resolved. No pathologic dilatation of small bowel or colon. Normal appendix. Status post hysterectomy. SKELETON No focal hypermetabolic  activity to suggest skeletal metastasis. IMPRESSION: 1. Today's study demonstrates a positive response to therapy. Specifically, there has been complete resolution of the hypermetabolic skin thickening in the left breast seen on the prior study, as well as resolution of left axillary, left supraclavicular, mediastinal and bilateral hilar lymphadenopathy. 2. Additionally, previously noted enlarged hypermetabolic retroperitoneal lymph nodes have resolved. Although there continues to be some low-level metabolic activity within the adrenal glands bilaterally, there is no definite nodule to suggest metastatic disease to the adrenal glands. 3. Previously suspect is osseous lesions are no longer apparent on either PET or CT imaging. 4. Previously noted soft tissue prominence and hypermetabolism in the right ovary has resolved. 5. Asymmetric hypermetabolic activity in the region of the left pharyngeal tonsil, favored be physiologic. This should be amenable to direct inspection. 6. Although there continues to be some very mild bilateral hydronephrosis and perinephric stranding, this has improved compared to the prior study. No associated hydroureter, and no urinary tract calculi. 7. Atherosclerosis, including three-vessel coronary artery disease. Please note that although the presence of coronary artery calcium documents the presence of coronary artery disease, the severity of this disease and any potential stenosis cannot be assessed on this non-gated CT examination. Assessment for potential risk factor modification, dietary therapy or pharmacologic therapy may be warranted, if clinically indicated. 8. Additional incidental findings, as above. Electronically Signed   By: Vinnie Langton M.D.   On: 12/20/2015 14:08    ASSESSMENT: Locally advanced carcinoma of breast (left) Biopsies consistent with lobular carcinoma.  Skin biopsies negative.  Hormone receptor and HER-2/neu receptor pending. Patient  has  palpable  lymphadenopathy She  has been started on Femara and IBRANCE tolerating very well. No significant side effect.  No evidence of myelosuppression. Left  breast mass is slightly soft.  Will continue anti-hormonal therapy and reevaluate patient in one month At scan has been reviewed independently in review with the patient.  There is significant decrease in the size of his tissue mass in the breast. As well as her resolution of retroperitoneal lymph node and bone metastases  This point I will continue letrozole and IBRANCE.  Dr. Ulyess Blossom to you and reevaluate patient for possibility of palliative mastectomy if it is feasible 2.  Previous history of carcinoma of right breast status post mastectomy done at [facility] in 2009  stage was not known that was no follow-up mammogram or anti-hormonal therapy  Comorbid conditions include COPD.  Continuing smoking.  Non-insulin-dependent diabetes. Hypertension.  PLAN: I  Patient is tolerating treatment very well.  The IBRANCE letrozole All lab data has been reviewed Proceed with second cycle of IBRANCE letrozole.  After 3 cycles we will repeat PET scan tumor markers to decide further line of treatment Tolerating IBRANCE letrozole. Evaluate patient with another PET scan prior to next appointment to evaluate for response  Reevaluate patient in 4 weeks  Tumor markers shows declining trend. CA 27.29 is 26..4,  3   months ago was 88.2    Forest Gleason, MD   01/01/2016 7:48 AM

## 2016-01-05 ENCOUNTER — Telehealth: Payer: Self-pay | Admitting: *Deleted

## 2016-01-05 NOTE — Telephone Encounter (Signed)
Message for patient to call the office.   Dr. Jamal Collin did speak with Dr. Oliva Bustard today. Patient needs to be scheduled for an appointment with Dr. Jamal Collin for the week of 01-09-16.  The original appointment that was made for the patient to see Dr. Jamal Collin on 01-31-16 by the [facility] can be cancelled.

## 2016-01-09 NOTE — Telephone Encounter (Signed)
Another message left for patient to call the office.

## 2016-01-26 ENCOUNTER — Inpatient Hospital Stay (HOSPITAL_BASED_OUTPATIENT_CLINIC_OR_DEPARTMENT_OTHER): Payer: Medicare HMO | Admitting: Oncology

## 2016-01-26 ENCOUNTER — Inpatient Hospital Stay: Payer: Medicare HMO | Attending: Oncology

## 2016-01-26 ENCOUNTER — Encounter: Payer: Self-pay | Admitting: Oncology

## 2016-01-26 VITALS — BP 111/68 | HR 98 | Temp 96.7°F | Resp 18 | Wt 173.0 lb

## 2016-01-26 DIAGNOSIS — Z170 Estrogen receptor positive status [ER+]: Secondary | ICD-10-CM | POA: Diagnosis not present

## 2016-01-26 DIAGNOSIS — Z853 Personal history of malignant neoplasm of breast: Secondary | ICD-10-CM

## 2016-01-26 DIAGNOSIS — Z7982 Long term (current) use of aspirin: Secondary | ICD-10-CM

## 2016-01-26 DIAGNOSIS — Z9981 Dependence on supplemental oxygen: Secondary | ICD-10-CM | POA: Insufficient documentation

## 2016-01-26 DIAGNOSIS — Z79899 Other long term (current) drug therapy: Secondary | ICD-10-CM

## 2016-01-26 DIAGNOSIS — J449 Chronic obstructive pulmonary disease, unspecified: Secondary | ICD-10-CM | POA: Diagnosis not present

## 2016-01-26 DIAGNOSIS — E119 Type 2 diabetes mellitus without complications: Secondary | ICD-10-CM | POA: Insufficient documentation

## 2016-01-26 DIAGNOSIS — F17210 Nicotine dependence, cigarettes, uncomplicated: Secondary | ICD-10-CM | POA: Insufficient documentation

## 2016-01-26 DIAGNOSIS — Z79811 Long term (current) use of aromatase inhibitors: Secondary | ICD-10-CM | POA: Insufficient documentation

## 2016-01-26 DIAGNOSIS — K219 Gastro-esophageal reflux disease without esophagitis: Secondary | ICD-10-CM | POA: Insufficient documentation

## 2016-01-26 DIAGNOSIS — Z9011 Acquired absence of right breast and nipple: Secondary | ICD-10-CM | POA: Insufficient documentation

## 2016-01-26 DIAGNOSIS — Z85830 Personal history of malignant neoplasm of bone: Secondary | ICD-10-CM | POA: Diagnosis not present

## 2016-01-26 DIAGNOSIS — I10 Essential (primary) hypertension: Secondary | ICD-10-CM

## 2016-01-26 DIAGNOSIS — C50912 Malignant neoplasm of unspecified site of left female breast: Secondary | ICD-10-CM

## 2016-01-26 LAB — CBC WITH DIFFERENTIAL/PLATELET
Basophils Absolute: 0.1 10*3/uL (ref 0–0.1)
Basophils Relative: 3 %
EOS ABS: 0.1 10*3/uL (ref 0–0.7)
Eosinophils Relative: 2 %
HEMATOCRIT: 29.5 % — AB (ref 35.0–47.0)
HEMOGLOBIN: 9.9 g/dL — AB (ref 12.0–16.0)
LYMPHS ABS: 1.7 10*3/uL (ref 1.0–3.6)
Lymphocytes Relative: 35 %
MCH: 32.9 pg (ref 26.0–34.0)
MCHC: 33.5 g/dL (ref 32.0–36.0)
MCV: 98.2 fL (ref 80.0–100.0)
MONOS PCT: 11 %
Monocytes Absolute: 0.6 10*3/uL (ref 0.2–0.9)
NEUTROS ABS: 2.5 10*3/uL (ref 1.4–6.5)
NEUTROS PCT: 49 %
Platelets: 367 10*3/uL (ref 150–440)
RBC: 3.01 MIL/uL — ABNORMAL LOW (ref 3.80–5.20)
RDW: 15.2 % — ABNORMAL HIGH (ref 11.5–14.5)
WBC: 5 10*3/uL (ref 3.6–11.0)

## 2016-01-26 LAB — COMPREHENSIVE METABOLIC PANEL
ALK PHOS: 76 U/L (ref 38–126)
ALT: 10 U/L — ABNORMAL LOW (ref 14–54)
ANION GAP: 7 (ref 5–15)
AST: 15 U/L (ref 15–41)
Albumin: 4.1 g/dL (ref 3.5–5.0)
BILIRUBIN TOTAL: 0.3 mg/dL (ref 0.3–1.2)
BUN: 31 mg/dL — ABNORMAL HIGH (ref 6–20)
CALCIUM: 9.3 mg/dL (ref 8.9–10.3)
CO2: 28 mmol/L (ref 22–32)
Chloride: 101 mmol/L (ref 101–111)
Creatinine, Ser: 1.29 mg/dL — ABNORMAL HIGH (ref 0.44–1.00)
GFR calc non Af Amer: 42 mL/min — ABNORMAL LOW (ref >60)
GFR, EST AFRICAN AMERICAN: 49 mL/min — AB (ref >60)
Glucose, Bld: 109 mg/dL — ABNORMAL HIGH (ref 65–99)
Potassium: 4.1 mmol/L (ref 3.5–5.1)
Sodium: 136 mmol/L (ref 135–145)
TOTAL PROTEIN: 7.7 g/dL (ref 6.5–8.1)

## 2016-01-26 MED ORDER — CYCLOBENZAPRINE HCL 10 MG PO TABS: 10.0000 mg | ORAL_TABLET | Freq: Every day | ORAL | Status: DC

## 2016-01-27 LAB — CANCER ANTIGEN 27.29: CA 27.29: 26.1 U/mL (ref 0.0–38.6)

## 2016-01-28 ENCOUNTER — Encounter: Payer: Self-pay | Admitting: Oncology

## 2016-01-28 NOTE — Progress Notes (Signed)
[place] @ [facility] [phone]  Fax:([phone] [place]: [birth year]  MR#: [id number]  BJS#:[id number]  Patient Care Team: Donnie Coffin, MD as PCP - General (Family Medicine) Donnie Coffin, MD as Referring Physician (Family Medicine) Seeplaputhur Robinette Haines, MD (General Surgery)  CHIEF COMPLAINT:  Chief Complaint  Patient presents with  . 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  HER-2/neu receptor negative Clinically stage is T4 N1 M1 tumor Stage IV disease 2.  Previous history of carcinoma of right breast status post mastectomy was done at [place] not known Cording to patient she did not have any follow-up treatment. 3.  Patient has been started on letrozole and IBRANCE from December, 2016.   VISIT DIAGNOSIS:  Cancer of left breast  No flowsheet data found.  INTERVAL HISTORY: 70 year old lady who has a previous history of non-insulin-dependent diabetes.  And hypertension.  Patient had a very poor follow-up regarding her right breast carcinoma for which patient had mastectomy done at [facility].  Patient does not report to have any mammogram except for one mammogram 2 years ago when patient was told that has not in duration and needs to be evaluated.  Recently starting second cycle of treatment with IBRANCE and letrozole.  Tolerated treatment very well.  No bony pains.  No nausea no vomiting.  Here for further follow-up and treatment consideration  According to patient's she has noticed an mass in the left breast is softer. Patient is tolerating treatment very well without any significant nausea vomiting diarrhea or fever.   No Bony pains.  Patient has an appointment to see Dr. Jamal Collin on sixth of June or consideration of palliative mastectomy No bony pains appetite has been stable.  Patient is here for ongoing evaluation  and continuation of treatment.  Tolerating IBRANCE without any significant problems REVIEW OF SYSTEMS:   GENERAL: Patient is not in any acute distress PERFORMANCE STATUS (ECOG): 01 HEENT:  No visual changes, runny nose, sore throat, mouth sores or tenderness. Lungs: Has a history of COPD.  Using inhaler.  On oxygen. Cardiac:  No chest pain, palpitations, orthopnea, or PND. GI:  No nausea, vomiting, diarrhea, constipation, melena or hematochezia. GU:  No urgency, frequency, dysuria, or hematuria. Musculoskeletal:  Patient started having increasing back pain for last year or so has been on meloxicam and muscle relaxant. Extremities:  No pain or swelling. Skin:  No rashes or skin changes. Neuro:  No headache, numbness or weakness, balance or coordination issues. Endocrine:  No diabetes, thyroid issues, hot flashes or night sweats. Psych:  No mood changes, depression or anxiety. Pain:  No focal pain. Review of systems:  All other systems reviewed and found to be negative.  As per HPI. Otherwise, a complete review of systems is negatve.  PAST MEDICAL HISTORY: Past Medical History  Diagnosis Date  . Hypertension   . Diabetes mellitus without complication ([place])   . GERD (gastroesophageal reflux disease)   . Breast cancer ([place]) 2009    RT MASTECTOMY  . Cancer of left breast ([place]) 08/09/2015    PAST SURGICAL HISTORY: Past Surgical History  Procedure Laterality Date  . Tubal ligation    . Breast surgery Right 2015    mastectomy  . Abdominal hysterectomy      FAMILY HISTORY Family History  Problem Relation Age of Onset  . Breast cancer Neg Hx  ADVANCED DIRECTIVES:  Patient does not have any living will or healthcare power of attorney.  Information was given .  Available resources had been discussed.  We will follow-up on subsequent appointments regarding this issue  HEALTH MAINTENANCE: Social History  Substance Use Topics  . Smoking status: Current Every Day Smoker  -- 1.00 packs/day for 15 years    Types: Cigarettes  . Smokeless tobacco: None  . Alcohol Use: No   No Known Allergies  Current Outpatient Prescriptions  Medication Sig Dispense Refill  . ADVAIR DISKUS 500-50 MCG/DOSE AEPB     . aspirin 81 MG tablet Take 81 mg by mouth daily.    . calcium-vitamin D 250-100 MG-UNIT tablet Take 1 tablet by mouth 2 (two) times daily.    . CHANTIX 1 MG tablet Take 1 mg by mouth daily.     . cyclobenzaprine (FLEXERIL) 10 MG tablet Take 1 tablet (10 mg total) by mouth at bedtime. 30 tablet 3  . fluticasone (FLONASE) 50 MCG/ACT nasal spray Place 2 sprays into both nostrils daily.    [name] GLIPIZIDE XL 2.5 MG 24 hr tablet Take 2.5 mg by mouth daily with breakfast.     . hydrochlorothiazide (MICROZIDE) 12.5 MG capsule Take 12.5 mg by mouth daily.     [name] letrozole (FEMARA) 2.5 MG tablet Take 1 tablet (2.5 mg total) by mouth daily. 90 tablet 3  . lisinopril (PRINIVIL,ZESTRIL) 40 MG tablet Take 40 mg by mouth daily.     . meloxicam (MOBIC) 7.5 MG tablet Take 7.5 mg by mouth daily.     . montelukast (SINGULAIR) 10 MG tablet Take 10 mg by mouth at bedtime.    [name] omeprazole (PRILOSEC) 20 MG capsule Take 20 mg by mouth daily.     . OXYGEN Inhale into the lungs.    . palbociclib (IBRANCE) 125 MG capsule Take 1 capsule (125 mg total) by mouth daily with breakfast. Take whole with food. Take for 21 days then 7 days off. 21 capsule 3  . SPIRIVA HANDIHALER 18 MCG inhalation capsule Place 18 mcg into inhaler and inhale.      No current facility-administered medications for this visit.    OBJECTIVE: PHYSICAL EXAM: Patient is alert oriented not in any acute distress.  Head exam was generally normal. There was no scleral icterus or corneal arcus. Mucous membranes were moist. Examination of neck: No palpable lymphadenopathy.  Thyroid within normal limit Chest: Emphysematous chest.  Patient is on oxygen.  Coarse crepitation. Cardiac exam revealed the PMI to be normally situated  and sized. The rhythm was regular and no extrasystoles were noted during several minutes of auscultation. The first and second heart sounds were normal and physiologic splitting of the second heart sound was noted. There were no murmurs, rubs, clicks, or gallops. Abdominal exam revealed normal bowel sounds. The abdomen was soft, non-tender, and without masses, organomegaly, or appreciable enlargement of the abdominal aorta. Musculoskeletal system tenderness in mid back area.  A no other abnormality detected Examination of the skin revealed no evidence of significant rashes, suspicious appearing nevi or other concerning lesions. Neurologically, the patient was awake, alert, and oriented to person, place and time. There were no obvious focal neurologic abnormalities.  Right breast: Status post mastectomy no evidence of recurrent disease.  Left breast hard mass.  Palpable mass almost involving the whole breast.  Skin shows some redness.  Palpable left axillary lymphadenopathy .  Mass in the lymph node enlargement is decreased in size Filed Vitals:  01/26/16 1512  BP: 111/68  Pulse: 98  Temp: 96.7 F (35.9 C)  Resp: 18     Body mass index is 30.65 kg/(m^2).    ECOG FS:1 - Symptomatic but completely ambulatory  LAB RESULTS:  Appointment on 01/26/2016  Component Date Value Ref Range Status  . WBC 01/26/2016 5.0  3.6 - 11.0 K/uL Final  . RBC 01/26/2016 3.01* 3.80 - 5.20 MIL/uL Final  . Hemoglobin 01/26/2016 9.9* 12.0 - 16.0 g/dL Final  . HCT 01/26/2016 29.5* 35.0 - 47.0 % Final  . MCV 01/26/2016 98.2  80.0 - 100.0 fL Final  . MCH 01/26/2016 32.9  26.0 - 34.0 pg Final  . MCHC 01/26/2016 33.5  32.0 - 36.0 g/dL Final  . RDW 01/26/2016 15.2* 11.5 - 14.5 % Final  . Platelets 01/26/2016 367  150 - 440 K/uL Final  . Neutrophils Relative % 01/26/2016 49   Final  . Neutro Abs 01/26/2016 2.5  1.4 - 6.5 K/uL Final  . Lymphocytes Relative 01/26/2016 35   Final  . Lymphs Abs 01/26/2016 1.7  1.0 - 3.6  K/uL Final  . Monocytes Relative 01/26/2016 11   Final  . Monocytes Absolute 01/26/2016 0.6  0.2 - 0.9 K/uL Final  . Eosinophils Relative 01/26/2016 2   Final  . Eosinophils Absolute 01/26/2016 0.1  0 - 0.7 K/uL Final  . Basophils Relative 01/26/2016 3   Final  . Basophils Absolute 01/26/2016 0.1  0 - 0.1 K/uL Final  . Sodium 01/26/2016 136  135 - 145 mmol/L Final  . Potassium 01/26/2016 4.1  3.5 - 5.1 mmol/L Final  . Chloride 01/26/2016 101  101 - 111 mmol/L Final  . CO2 01/26/2016 28  22 - 32 mmol/L Final  . Glucose, Bld 01/26/2016 109* 65 - 99 mg/dL Final  . BUN 01/26/2016 31* 6 - 20 mg/dL Final  . Creatinine, Ser 01/26/2016 1.29* 0.44 - 1.00 mg/dL Final  . Calcium 01/26/2016 9.3  8.9 - 10.3 mg/dL Final  . Total Protein 01/26/2016 7.7  6.5 - 8.1 g/dL Final  . Albumin 01/26/2016 4.1  3.5 - 5.0 g/dL Final  . AST 01/26/2016 15  15 - 41 U/L Final  . ALT 01/26/2016 10* 14 - 54 U/L Final  . Alkaline Phosphatase 01/26/2016 76  38 - 126 U/L Final  . Total Bilirubin 01/26/2016 0.3  0.3 - 1.2 mg/dL Final  . GFR calc non Af Amer 01/26/2016 42* >60 mL/min Final  . GFR calc Af Amer 01/26/2016 49* >60 mL/min Final   Comment: (NOTE) The eGFR has been calculated using the CKD EPI equation. This calculation has not been validated in all clinical situations. eGFR's persistently <60 mL/min signify possible Chronic Kidney Disease.   . Anion gap 01/26/2016 7  5 - 15 Final  . CA 27.29 01/26/2016 26.1  0.0 - 38.6 U/mL Final   Comment: (NOTE) Bayer Cent[employer]/ACS methodology Performed At: [facility] [place], [place] [id number] Lindon Romp MD NW:[id number]      STUDIES: No results found.  ASSESSMENT: Locally advanced carcinoma of breast (left) Biopsies consistent with lobular carcinoma.  Skin biopsies negative.  Hormone receptor and HER-2/neu receptor pending. Patient  has  palpable lymphadenopathy She  has been started on Femara and IBRANCE tolerating very  well. No significant side effect.  No evidence of myelosuppression. Left  breast mass is slightly soft.  Will continue anti-hormonal therapy and reevaluate patient in one month At scan has been reviewed independently in review with the  patient.  There is significant decrease in the size of his tissue mass in the breast. As well as her resolution of retroperitoneal lymph node and bone metastases  This point I will continue letrozole and IBRANCE.  Dr. Jamal Collin  to reevaluate patient for possibility of palliative mastectomy if it is feasible 2.  Previous history of carcinoma of right breast status post mastectomy done at [facility] in 2009  stage was not known that was no follow-up mammogram or anti-hormonal therapy  Comorbid conditions include COPD.  Continuing smoking.  Non-insulin-dependent diabetes. Hypertension.  PLAN: I  Patient is tolerating treatment very well.  The IBRANCE letrozole All lab data has been reviewed Proceed with second cycle of IBRANCE letrozole.  After 3 cycles we will repeat PET scan tumor markers to decide further line of treatment Tolerating IBRANCE letrozole. Anemia Awaiting evaluation by Dr. Jamal Collin  Tumor markers shows declining trend. CA 27.29 is 26..1,  3   months ago was 88.2    Forest Gleason, MD   01/28/2016 4:52 PM

## 2016-01-30 ENCOUNTER — Encounter: Payer: Self-pay | Admitting: *Deleted

## 2016-01-31 ENCOUNTER — Ambulatory Visit (INDEPENDENT_AMBULATORY_CARE_PROVIDER_SITE_OTHER): Payer: Medicare HMO | Admitting: General Surgery

## 2016-01-31 ENCOUNTER — Encounter: Payer: Self-pay | Admitting: General Surgery

## 2016-01-31 VITALS — BP 144/74 | HR 90 | Resp 17 | Ht 63.0 in | Wt 174.0 lb

## 2016-01-31 DIAGNOSIS — Z853 Personal history of malignant neoplasm of breast: Secondary | ICD-10-CM

## 2016-01-31 DIAGNOSIS — C50912 Malignant neoplasm of unspecified site of left female breast: Secondary | ICD-10-CM | POA: Diagnosis not present

## 2016-01-31 NOTE — Progress Notes (Signed)
Patient ID: Sophia Nelson, female   DOB: 22-Oct-1945, 70 y.o.   MRN: [id number]  Chief Complaint  Patient presents with  . Other    breast surgery    HPI Sophia Nelson is a 70 y.o. female here today to discuss breast surgery.  Patient reports left breast mass, but no bloody discharge, tenderness or pain.  She is here today with her mother, and her son lives with her. She uses oxygen via nasal canula, 2 L.  Pt has history of DCIS with right mastectomy. She initially presented with a very hard left breast mass involving the entire breast. She was diagnosed as Stage IV [T4 N1 M1] and has been on IBRANCE and [facility] with good response. I have reviewed the history of present illness with the patient.   HPI  Past Medical History  Diagnosis Date  . Hypertension   . Diabetes mellitus without complication ([place])   . GERD (gastroesophageal reflux disease)   . Breast cancer ([place]) 2009    RT MASTECTOMY, DCIS  . Cancer of left breast ([place]) 08/09/2015    T4 N1 M1 tumor, INVASIVE LOBULAR CARCINOMA.    Past Surgical History  Procedure Laterality Date  . Tubal ligation    . Breast surgery Right 2015    mastectomy  . Abdominal hysterectomy      Family History  Problem Relation Age of Onset  . Breast cancer Neg Hx   . Lung cancer Father     Social History Social History  Substance Use Topics  . Smoking status: Current Every Day Smoker -- 1.00 packs/day for 15 years    Types: Cigarettes  . Smokeless tobacco: None  . Alcohol Use: No    No Known Allergies  Current Outpatient Prescriptions  Medication Sig Dispense Refill  . ADVAIR DISKUS 500-50 MCG/DOSE AEPB     . aspirin 81 MG tablet Take 81 mg by mouth daily.    . calcium-vitamin D 250-100 MG-UNIT tablet Take 1 tablet by mouth 2 (two) times daily.    . CHANTIX 1 MG tablet Take 1 mg by mouth daily.     . cyclobenzaprine (FLEXERIL) 10 MG tablet Take 1 tablet (10 mg total) by mouth at bedtime. 30 tablet 3  . fluticasone (FLONASE) 50  MCG/ACT nasal spray Place 2 sprays into both nostrils daily.    [name] GLIPIZIDE XL 2.5 MG 24 hr tablet Take 2.5 mg by mouth daily with breakfast.     . hydrochlorothiazide (MICROZIDE) 12.5 MG capsule Take 12.5 mg by mouth daily.     [name] letrozole (FEMARA) 2.5 MG tablet Take 1 tablet (2.5 mg total) by mouth daily. 90 tablet 3  . lisinopril (PRINIVIL,ZESTRIL) 40 MG tablet Take 40 mg by mouth daily.     . meloxicam (MOBIC) 7.5 MG tablet Take 7.5 mg by mouth daily.     . montelukast (SINGULAIR) 10 MG tablet Take 10 mg by mouth at bedtime.    [name] omeprazole (PRILOSEC) 20 MG capsule Take 20 mg by mouth daily.     . OXYGEN Inhale into the lungs.    . palbociclib (IBRANCE) 125 MG capsule Take 1 capsule (125 mg total) by mouth daily with breakfast. Take whole with food. Take for 21 days then 7 days off. 21 capsule 3  . SPIRIVA HANDIHALER 18 MCG inhalation capsule Place 18 mcg into inhaler and inhale.      No current facility-administered medications for this visit.    Review of Systems Review of Systems  Constitutional: Negative.   Respiratory: Negative.   Cardiovascular: Negative.   Gastrointestinal: Negative.     Blood pressure 144/74, pulse 90, resp. rate 17, height 5\' 3"  (1.6 m), weight 174 lb (78.926 kg).  Physical Exam Physical Exam  Constitutional: She is oriented to person, place, and time. She appears well-developed and well-nourished.  HENT:  Mouth/Throat: Oropharynx is clear and moist.  Eyes: Conjunctivae are normal. No scleral icterus.  Neck: Neck supple.  Cardiovascular: Normal rate, regular rhythm and normal heart sounds.   Pulmonary/Chest: Effort normal and breath sounds normal. Left breast exhibits mass. Left breast exhibits no inverted nipple, no nipple discharge, no skin change and no tenderness.    Left breast mass. Right mastectomy site intact.  Abdominal: Soft. Bowel sounds are normal. There is no tenderness.  Lymphadenopathy:    She has no cervical adenopathy.    She has  no axillary adenopathy.  Neurological: She is alert and oriented to person, place, and time.  Skin: Skin is warm and dry.  Psychiatric: Her behavior is normal.    Data Reviewed Prior notes.  Assessment    Left breast invasive lobular cancer.    Plan    Schedule surgery, left mastectomy. Pt has been explained the risks and benefits of procedure. This is a palliative procedure. Pt is agreeable to plan.     Patient is scheduled for surgery at [facility] on 02/29/16. She will pre admit by phone. We will obtain a pulmonary clearance for surgery. Patient is aware of date and instructions.   PCP:  [name] This information has been scribed by [name] CMA.    Yoshie Kosel G 02/01/2016, 3:56 PM

## 2016-01-31 NOTE — Patient Instructions (Addendum)
The patient is aware to call back for any questions or concerns.  Patient is scheduled for surgery at [facility] on 02/29/16. She will pre admit by phone. We will obtain a pulmonary clearance for surgery. Patient is aware of date and instructions.

## 2016-02-01 ENCOUNTER — Encounter: Payer: Self-pay | Admitting: General Surgery

## 2016-02-21 ENCOUNTER — Inpatient Hospital Stay: Admission: RE | Admit: 2016-02-21 | Payer: Self-pay | Source: Ambulatory Visit

## 2016-02-21 ENCOUNTER — Other Ambulatory Visit: Payer: Self-pay | Admitting: *Deleted

## 2016-02-21 ENCOUNTER — Encounter
Admission: RE | Admit: 2016-02-21 | Discharge: 2016-02-21 | Disposition: A | Discharge status: Home / Self Care | Payer: Medicare HMO | Source: Ambulatory Visit | Attending: General Surgery | Admitting: General Surgery

## 2016-02-21 DIAGNOSIS — Z01810 Encounter for preprocedural cardiovascular examination: Secondary | ICD-10-CM | POA: Diagnosis present

## 2016-02-21 DIAGNOSIS — Z01812 Encounter for preprocedural laboratory examination: Secondary | ICD-10-CM | POA: Insufficient documentation

## 2016-02-21 DIAGNOSIS — C50912 Malignant neoplasm of unspecified site of left female breast: Secondary | ICD-10-CM

## 2016-02-21 HISTORY — DX: Headache: R51

## 2016-02-21 HISTORY — DX: Reserved for inherently not codable concepts without codable children: IMO0001

## 2016-02-21 HISTORY — DX: Headache, unspecified: R51.9

## 2016-02-21 HISTORY — DX: Anemia, unspecified: D64.9

## 2016-02-21 HISTORY — DX: Unspecified osteoarthritis, unspecified site: M19.90

## 2016-02-21 HISTORY — DX: Chronic obstructive pulmonary disease, unspecified: J44.9

## 2016-02-21 HISTORY — DX: Pneumonia, unspecified organism: J18.9

## 2016-02-21 LAB — HEMOGLOBIN: Hemoglobin: 10.1 g/dL — ABNORMAL LOW (ref 12.0–16.0)

## 2016-02-21 NOTE — Patient Instructions (Signed)
  Your procedure is scheduled on: 02-29-16 [facility]) Report to Same Day Surgery 2nd floor medical mall To find out your arrival time please call [phone] between 1PM - 3PM on 02-28-16 (TUESDAY)  Remember: Instructions that are not followed completely may result in serious medical risk, up to and including death, or upon the discretion of your surgeon and anesthesiologist your surgery may need to be rescheduled.    _x___ 1. Do not eat food or drink liquids after midnight. No gum chewing or hard candies.     __x__ 2. No Alcohol for 24 hours before or after surgery.   __x__3. No Smoking for 24 prior to surgery.   ____  4. Bring all medications with you on the day of surgery if instructed.    __x__ 5. Notify your doctor if there is any change in your medical condition     (cold, fever, infections).     Do not wear jewelry, make-up, hairpins, clips or nail polish.  Do not wear lotions, powders, or perfumes. You may wear deodorant.  Do not shave 48 hours prior to surgery. Men may shave face and neck.  Do not bring valuables to the hospital.    [facility] is not responsible for any belongings or valuables.               Contacts, dentures or bridgework may not be worn into surgery.  Leave your suitcase in the car. After surgery it may be brought to your room.  For patients admitted to the hospital, discharge time is determined by your treatment team.   Patients discharged the day of surgery will not be allowed to drive home.    Please read over the following fact sheets that you were given:   [facility] [place] Preparing for Surgery and or MRSA Information   _x___ Take these medicines the morning of surgery with A SIP OF WATER:    1. OMEPRAZOLE (PRILOSEC)  2. TAKE AN EXTRA PRILOSEC ON Tuesday NIGHT BEFORE BED  3.  4.  5.  6.  ____ Fleet Enema (as directed)   _x___ Use CHG Soap or sage wipes as directed on instruction sheet   _X___ Use inhalers on the day of surgery and bring to  hospital day of surgery-USE ADVAIR, SPIRIVA AND ALBUTEROL [place]  ____ Stop metformin 2 days prior to surgery    ____ Take 1/2 of usual insulin dose the night before surgery and none on the morning of surgery.   _X___ Stop aspirin or coumadin, or plavix-OK TO CONTINUE 81 MG ASPIRIN-DO NOT TAKE AM OF SURGERY  _x__ Stop Anti-inflammatories such as Advil, Aleve, Ibuprofen, Motrin, Naproxen,          Naprosyn, Goodies powders or aspirin products. Ok to take Tylenol.   ____ Stop supplements until after surgery.    ____ Bring C-Pap to the hospital.

## 2016-02-21 NOTE — Pre-Procedure Instructions (Signed)
  Electronically Signed By: DanielEntrikin M.D. On: 12/20/2015 14:08  Status Results Details     Impression: Very severe copd, poor lung function Stage four left breast cancer for mastectomy (palliative), some improvement to therapy ( see above ct report) Hypoxia, on oxygen Dyspnea per above  Plan: Continue same copd meds Stop cigarettes A-1-at level and phenotype Portable oxygen  Cleared for left breast mastectomy recognizing she is high risk for prolong intubation Follow up in 6 weeks

## 2016-02-23 ENCOUNTER — Inpatient Hospital Stay: Payer: Medicare HMO | Admitting: *Deleted

## 2016-02-23 ENCOUNTER — Inpatient Hospital Stay (HOSPITAL_BASED_OUTPATIENT_CLINIC_OR_DEPARTMENT_OTHER): Payer: Medicare HMO | Admitting: Internal Medicine

## 2016-02-23 VITALS — BP 107/63 | HR 85 | Temp 96.5°F | Resp 18 | Wt 173.3 lb

## 2016-02-23 DIAGNOSIS — C50812 Malignant neoplasm of overlapping sites of left female breast: Secondary | ICD-10-CM

## 2016-02-23 DIAGNOSIS — C50912 Malignant neoplasm of unspecified site of left female breast: Secondary | ICD-10-CM | POA: Diagnosis not present

## 2016-02-23 DIAGNOSIS — Z7982 Long term (current) use of aspirin: Secondary | ICD-10-CM

## 2016-02-23 DIAGNOSIS — Z79811 Long term (current) use of aromatase inhibitors: Secondary | ICD-10-CM | POA: Diagnosis not present

## 2016-02-23 DIAGNOSIS — Z9981 Dependence on supplemental oxygen: Secondary | ICD-10-CM

## 2016-02-23 DIAGNOSIS — Z170 Estrogen receptor positive status [ER+]: Secondary | ICD-10-CM | POA: Diagnosis not present

## 2016-02-23 DIAGNOSIS — Z85830 Personal history of malignant neoplasm of bone: Secondary | ICD-10-CM

## 2016-02-23 DIAGNOSIS — Z853 Personal history of malignant neoplasm of breast: Secondary | ICD-10-CM | POA: Diagnosis not present

## 2016-02-23 DIAGNOSIS — Z9011 Acquired absence of right breast and nipple: Secondary | ICD-10-CM

## 2016-02-23 DIAGNOSIS — E119 Type 2 diabetes mellitus without complications: Secondary | ICD-10-CM

## 2016-02-23 DIAGNOSIS — Z79899 Other long term (current) drug therapy: Secondary | ICD-10-CM

## 2016-02-23 DIAGNOSIS — F17210 Nicotine dependence, cigarettes, uncomplicated: Secondary | ICD-10-CM

## 2016-02-23 DIAGNOSIS — J449 Chronic obstructive pulmonary disease, unspecified: Secondary | ICD-10-CM

## 2016-02-23 DIAGNOSIS — I10 Essential (primary) hypertension: Secondary | ICD-10-CM

## 2016-02-23 LAB — CBC WITH DIFFERENTIAL/PLATELET
BASOS ABS: 0.1 10*3/uL (ref 0–0.1)
Basophils Relative: 3 %
EOS ABS: 0.1 10*3/uL (ref 0–0.7)
EOS PCT: 2 %
HCT: 27 % — ABNORMAL LOW (ref 35.0–47.0)
Hemoglobin: 9.2 g/dL — ABNORMAL LOW (ref 12.0–16.0)
LYMPHS ABS: 2.2 10*3/uL (ref 1.0–3.6)
LYMPHS PCT: 37 %
MCH: 33.1 pg (ref 26.0–34.0)
MCHC: 33.9 g/dL (ref 32.0–36.0)
MCV: 97.8 fL (ref 80.0–100.0)
MONO ABS: 0.8 10*3/uL (ref 0.2–0.9)
Monocytes Relative: 14 %
Neutro Abs: 2.6 10*3/uL (ref 1.4–6.5)
Neutrophils Relative %: 44 %
PLATELETS: 299 10*3/uL (ref 150–440)
RBC: 2.76 MIL/uL — ABNORMAL LOW (ref 3.80–5.20)
RDW: 15.4 % — AB (ref 11.5–14.5)
WBC: 5.9 10*3/uL (ref 3.6–11.0)

## 2016-02-23 LAB — COMPREHENSIVE METABOLIC PANEL
ALT: 8 U/L — ABNORMAL LOW (ref 14–54)
AST: 14 U/L — AB (ref 15–41)
Albumin: 3.9 g/dL (ref 3.5–5.0)
Alkaline Phosphatase: 61 U/L (ref 38–126)
Anion gap: 9 (ref 5–15)
BUN: 26 mg/dL — AB (ref 6–20)
CHLORIDE: 97 mmol/L — AB (ref 101–111)
CO2: 28 mmol/L (ref 22–32)
Calcium: 9.1 mg/dL (ref 8.9–10.3)
Creatinine, Ser: 1.19 mg/dL — ABNORMAL HIGH (ref 0.44–1.00)
GFR, EST AFRICAN AMERICAN: 54 mL/min — AB (ref >60)
GFR, EST NON AFRICAN AMERICAN: 46 mL/min — AB (ref >60)
Glucose, Bld: 104 mg/dL — ABNORMAL HIGH (ref 65–99)
POTASSIUM: 4 mmol/L (ref 3.5–5.1)
Sodium: 134 mmol/L — ABNORMAL LOW (ref 135–145)
Total Bilirubin: 0.3 mg/dL (ref 0.3–1.2)
Total Protein: 7.4 g/dL (ref 6.5–8.1)

## 2016-02-23 NOTE — Pre-Procedure Instructions (Signed)
CALLED DR Ronelle Nigh AND INFORMED HIM THAT PT HAS BEEN CLEARED BY PULMONARY MD BUT DR Moncure PT IS HIGH RISK FOR PROLONGED INTUBATION-DR KEPHART WANTS NOTE IN EPIC THAT WILL ALERT ANESTHESIA THE MORNING OF SURGERY-CLEARANCE NOTE IN EPIC AND NOTE PUT IN FOR ANESTHESIA AS WELL

## 2016-02-23 NOTE — Pre-Procedure Instructions (Signed)
Erby Pian, MD - 02/15/2016 2:00 PM EDT Formatting of this note may be different from the original.  Referring Physician:  [name] Nelson*  Pulmonologist: @APPTPROVNAME @  COPD (CHRONIC RESPIRATORY FAILURE) and [name] (LEFT TOTAL MASTECTOMY)  History of Present Illness: Sophia Nelson is a 70 y.o. female presents to clinic for pulmonary clearance for left mastectomy. She has severe copd, advised she has poor lung function, risk of prolong intubation. In light of her malignancy, if this procedure will prolong life she is willing to undergo the procedure knowing the risks. She is not bleeding, no fever, chills, calf pain, wheezing or having anginal pain.   Current Medications:  Current Outpatient Prescriptions  Medication Sig Dispense Refill  . albuterol 90 mcg/actuation inhaler Inhale 2 inhalations into the lungs every 6 (six) hours as needed for Wheezing.  [name] aspirin (ASPIRIN LOW DOSE) 81 MG EC tablet Take by mouth.  . cyclobenzaprine (FLEXERIL) 10 MG tablet Take by mouth.  . fluticasone (FLONASE) 50 mcg/actuation nasal spray by Nasal route.  . fluticasone-salmeterol (ADVAIR DISKUS) 500-50 mcg/dose diskus inhaler  . hydroCHLOROthiazide (MICROZIDE) 12.5 mg capsule Take by mouth.  [name] ipratropium-albuterol (DUO-NEB) nebulizer solution Take 3 mLs by nebulization 4 (four) times daily as needed for Wheezing.  [name] lisinopril (PRINIVIL,ZESTRIL) 40 MG tablet Take by mouth.  . meloxicam (MOBIC) 7.5 MG tablet Take by mouth.  . montelukast (SINGULAIR) 10 mg tablet Take by mouth.  [name] omeprazole (PRILOSEC) 20 MG DR capsule Take by mouth.  . tiotropium (SPIRIVA WITH HANDIHALER) 18 mcg inhalation capsule Place into inhaler and inhale.   No current facility-administered medications for this visit.   Problem List:  There is no problem list on file for this patient.  Allergies:  Review of patient's allergies indicates no known allergies.  History:  Past  Medical History:  Diagnosis Date  . Asthma without status asthmaticus, unspecified  . Breast cancer , unspecified (CMS-HCC)  BILATERAL  . COPD (chronic obstructive pulmonary disease) , unspecified (CMS-HCC)  . Diabetes mellitus type 2, uncomplicated (CMS-HCC)  . Hypertension   Past Surgical History:  Procedure Laterality Date  . CATARACT EXTRACTION  . HYSTERECTOMY VAGINAL  . MASTECTOMY SIMPLE Right   Family History  Problem Relation Age of Onset  . Depression Mother  . Hypertension Mother   SHX: reports that she has been smoking. She has a 43.00 pack-year smoking history. She has never used smokeless tobacco. She reports that she drinks alcohol. She reports that she does not use illicit drugs.  Review of System: As per above. No other cardiopulmonary symptoms. No nausea, vomiting, diarrhea, blood in stools,dysuria or flank pain. No GERD, dysphagia, choking spells, polyphagia, polydipsia, heat or cold intolerance, rashes, lymph nodes, bleeding, seizures, TIAs, passing out spells, suicidaly ideation, proximal muscle tenderness, joint effusions. No strong history to suggest sleep apnea. All systems were reviewed with her in totality.   Physical Exam: BP 138/73  Pulse 94  Ht 158.8 cm (5' 2.5")  Wt 78.9 kg (174 lb)  SpO2 99% Comment: 2LPM O2 INTERMITTENT  BMI 31.32 kg/m2 78.9 kg (174 lb) 99% General: NAD. Able to speak in complete sentences without cough or dyspnea, [place] 02 at 2 liters HEENT: Normocephalic, nontraumatic. Extraocular movements intact NECK: Supple. No JVD, nodes, thyromegaly CV: RRR no murmurs, gallops, rubs PULM: Normal respiratory effort, Clear to auscultation bilaterally without wheezing or crackles, right breast mastectomy ABD: Soft, non tender, obesity EXTREMITIES: No significant edema, cyanosis or Homans'signs SKIN: Fair turgor. No rashes  LYMPHATIC: No nodes NEURO: No gross deficits, ambulating PSYCH: Appropriate affect, alert, oriented    Diagnostics: SPIROMETRY: FVC was 1.48 liters, 66% of predicted/Post 1.62, 72%, 9% Change FEV1 was 0.67, 37% of predicted/Post 0.76, 43%, 14% Change FEV1 ratio was 45/Post 47 FEF 25-75% liters per second was 8% of predicted/Post 8%, 0% Change *SVN given with 2.5 mg Albuterol for Post Spirometry.  LUNG VOLUMES: TLC was 55% of predicted RV was 57% of predicted  DIFFUSION CAPACITY: DLCO was 44% of predicted DLCO/VA was 70% of predicted  FLOW VOLUME LOOP: Expiratory flow volume loop is severely decreased  Impression Spirometry showed severe obstruction, bronchodilator efeect present TLC is decreased, suggesting underlying restrictive process DLCO is severely decreased  CLINICAL DATA:Subsequent treatment strategy for breast cancer. Restaging examination. Patient was originally diagnosed with right-sided breast cancer in 2009, at which point she had a mastectomy. Left-sided breast cancer than diagnosed in December 2016.  EXAM: NUCLEAR MEDICINE PET SKULL BASE TO THIGH  TECHNIQUE: 12.0 mCi F-18 FDG was injected intravenously. Full-ring PET imaging was performed from the skull base to thigh after the radiotracer. CT data was obtained and used for attenuation correction and anatomic localization.  FASTING BLOOD GLUCOSE:Value: 149 mg/dl  COMPARISON:PET-CT 08/10/2015.  FINDINGS: NECK  No hypermetabolic lymph nodes in the neck. There is some asymmetric hypermetabolism (SUVmax = 6.1) in the region of the left pharyngeal tonsil, which is nonspecific. Hypermetabolism in the larynx is likely physiologic as it appears to be symmetric.  CHEST  Previously noted soft tissue thickening and hypermetabolism in the left breast has resolved. All previously noted left axillary and left supraclavicular hypermetabolic lymph nodes have also decreased in size and resolved. No hypermetabolic mediastinal or hilar nodes. No suspicious pulmonary nodules on the CT scan. Heart size  is normal. There is no significant pericardial fluid, thickening or pericardial calcification. There is atherosclerosis of the thoracic aorta, the great vessels of the mediastinum and the coronary arteries, including calcified atherosclerotic plaque in the left anterior descending, left circumflex and right coronary arteries. Esophagus is unremarkable in appearance. Linear scarring in the periphery of the right lower lobe. Status post right modified radical mastectomy and right axillary lymph node dissection.  ABDOMEN/PELVIS  There continues to be some low-level hypermetabolism in the adrenal glands bilaterally, however, this has significantly decreased compared to the prior study (SUVmax = 3.2 on the right and 3.6 on the left)), and there is no adrenal nodularity identified. No abnormal hypermetabolic activity within the liver, pancreas or spleen. Small amount of high attenuation material lying dependently in the gallbladder likely represents tiny gallstones. No findings to suggest an acute cholecystitis at this time. Mild bilateral hydronephrosis has decreased compared to the prior study, as has surrounding perinephric stranding. No hypermetabolic lymph nodes in the abdomen or pelvis. Previously noted retroperitoneal lymphadenopathy has resolved. Previously noted right ovarian enlargement and hypermetabolism has resolved. No pathologic dilatation of small bowel or colon. Normal appendix. Status post hysterectomy.  SKELETON  No focal hypermetabolic activity to suggest skeletal metastasis.  IMPRESSION: 1. Today's study demonstrates a positive response to therapy. Specifically, there has been complete resolution of the hypermetabolic skin thickening in the left breast seen on the prior study, as well as resolution of left axillary, left supraclavicular, mediastinal and bilateral hilar lymphadenopathy. 2. Additionally, previously noted enlarged hypermetabolic retroperitoneal  lymph nodes have resolved. Although there continues to be some low-level metabolic activity within the adrenal glands bilaterally, there is no definite nodule to suggest metastatic disease to the adrenal glands.  3. Previously suspect is osseous lesions are no longer apparent on either PET or CT imaging. 4. Previously noted soft tissue prominence and hypermetabolism in the right ovary has resolved. 5. Asymmetric hypermetabolic activity in the region of the left pharyngeal tonsil, favored be physiologic. This should be amenable to direct inspection. 6. Although there continues to be some very mild bilateral hydronephrosis and perinephric stranding, this has improved compared to the prior study. No associated hydroureter, and no urinary tract calculi. 7. Atherosclerosis, including three-vessel coronary artery disease. Please note that although the presence of coronary artery calcium documents the presence of coronary artery disease, the severity of this disease and any potential stenosis cannot be assessed on this non-gated CT examination. Assessment for potential risk factor modification, dietary therapy or pharmacologic therapy may be warranted, if clinically indicated. 8. Additional incidental findings, as above.  Electronically Signed By: DanielEntrikin M.D. On: 12/20/2015 14:08  Status Results Details     Impression: Very severe copd, poor lung function Stage four left breast cancer for mastectomy (palliative), some improvement to therapy ( see above ct report) Hypoxia, on oxygen Dyspnea per above  Plan: Continue same copd meds Stop cigarettes A-1-at level and phenotype Portable oxygen  Cleared for left breast mastectomy recognizing she is high risk for prolong intubation Follow up in 6 weeks

## 2016-02-23 NOTE — Pre-Procedure Instructions (Signed)
PULMONARY CLEARANCE IN [facility] EP[facility] FROM DR [place] [facility] IS HIGH RISK FOR PROLONGED INTUBATION

## 2016-02-23 NOTE — Progress Notes (Signed)
Sophia Nelson OFFICE PROGRESS NOTE  Patient Care Team: Donnie Coffin, MD as PCP - General (Family Medicine) Donnie Coffin, MD as Referring Physician (Family Medicine) Seeplaputhur Robinette Haines, MD (General Surgery) Forest Gleason, MD (Oncology)  Cancer of left breast [facility])   Staging form: Breast, AJCC 7th Edition     Clinical: Stage IIIB (T4, N1, cM0(i+)) - Signed by Forest Gleason, MD on 08/09/2015     Pathologic: Stage IV (T4, N1, M1) - Signed by Forest Gleason, MD on 08/16/2015    Oncology History   # DEC 2016-  LEFT BREAST CA- LOBULAR CA ER/PR-Pos; her 2 NEG STAGE IV [[id number]- left breast/bil Ax LN/Media LN/RP LN; skeletal mets]; DEC 2016- LETROZOLE + IBRANCE; April 2017- Significant response to treatment- July plan pal mastec [Dr.Sankar]  # RIGHT BREAST CA [s/p mastectomy [facility]]     Cancer of left breast ([place])   08/09/2015 Initial Diagnosis Cancer of left breast ([place])      INTERVAL HISTORY:  This is my first interaction with the patient as patient's primary oncologist has been Dr.Choksi. I reviewed the patient's prior charts/pertinent labs/imaging in detail; findings are summarized above.    Sophia Nelson 70 y.o.  female pleasant patient above history of Metastatic breast cancer lobular carcinoma currently on ibrance and letrozole is here for follow-up. Given the excellent response to therapy on the PET scan in April 2017 patient has been evaluated by Dr. Jamal Collin for palliative mastectomy.  Patient denies any pain. Appetite is good. No weight loss. No nausea no vomiting. No chest pain. No fevers. No bone pain. She has been compliant with her medications.   REVIEW OF SYSTEMS:  A complete 10 point review of system is done which is negative except mentioned above/history of present illness.   PAST MEDICAL HISTORY :  Past Medical History  Diagnosis Date  . Hypertension   . Diabetes mellitus without complication ([place])   . GERD (gastroesophageal reflux disease)   . Breast  cancer ([place]) 2009    RT MASTECTOMY, DCIS  . Cancer of left breast ([place]) 08/09/2015    T4 N1 M1 tumor, INVASIVE LOBULAR CARCINOMA.  . Pneumonia 2015  . COPD (chronic obstructive pulmonary disease) ([place])   . Shortness of breath dyspnea     with exertion  . Headache     h/o migraines as a child  . Arthritis   . Anemia     PAST SURGICAL HISTORY :   Past Surgical History  Procedure Laterality Date  . Tubal ligation    . Breast surgery Right 2015    mastectomy  . Abdominal hysterectomy      FAMILY HISTORY :   Family History  Problem Relation Age of Onset  . Breast cancer Neg Hx   . Lung cancer Father     SOCIAL HISTORY:   Social History  Substance Use Topics  . Smoking status: Current Every Day Smoker -- 1.00 packs/day for 15 years    Types: Cigarettes  . Smokeless tobacco: Not on file     Comment: currently as of 02-21-16 pt states she is only smoking 2-3 cigarettes per day  . 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  MEDICATIONS:  Current Outpatient Prescriptions  Medication Sig Dispense Refill  . ADVAIR DISKUS 500-50 MCG/DOSE AEPB Inhale 1 puff into the  lungs 2 (two) times daily.     [name] albuterol (PROVENTIL HFA;VENTOLIN HFA) 108 (90 Base) MCG/ACT inhaler Inhale 2 puffs into the lungs every 6 (six) hours as needed for wheezing or shortness of breath.    [name] aspirin 81 MG tablet Take 81 mg by mouth daily.    . calcium carbonate (OSCAL) 1500 (600 Ca) MG TABS tablet Take 1,500 mg by mouth 2 (two) times daily with a meal.    . CHANTIX 1 MG tablet Take 1 mg by mouth 2 (two) times daily.     . cyclobenzaprine (FLEXERIL) 10 MG tablet Take 1 tablet (10 mg total) by mouth at bedtime. 30 tablet 3  . docusate sodium (COLACE) 100 MG capsule Take 100 mg by mouth 2 (two) times daily.    . fluticasone (FLONASE) 50 MCG/ACT nasal spray Place 2 sprays into both  nostrils daily.    [name] GLIPIZIDE XL 2.5 MG 24 hr tablet Take 2.5 mg by mouth daily with breakfast.     . hydrochlorothiazide (MICROZIDE) 12.5 MG capsule Take 12.5 mg by mouth daily.     [name] letrozole (FEMARA) 2.5 MG tablet Take 1 tablet (2.5 mg total) by mouth daily. 90 tablet 3  . lisinopril (PRINIVIL,ZESTRIL) 40 MG tablet Take 40 mg by mouth every morning.     . montelukast (SINGULAIR) 10 MG tablet Take 10 mg by mouth at bedtime.    [name] omeprazole (PRILOSEC) 20 MG capsule Take 20 mg by mouth every morning.     . OXYGEN Inhale 2 L into the lungs continuous.     . palbociclib (IBRANCE) 125 MG capsule Take 1 capsule (125 mg total) by mouth daily with breakfast. Take whole with food. Take for 21 days then 7 days off. 21 capsule 3  . SPIRIVA HANDIHALER 18 MCG inhalation capsule Place 18 mcg into inhaler and inhale every morning.      No current facility-administered medications for this visit.    PHYSICAL EXAMINATION: ECOG PERFORMANCE STATUS: 0 - Asymptomatic  BP 107/63 mmHg  Pulse 85  Temp(Src) 96.5 F (35.8 C) (Tympanic)  Resp 18  Wt 173 lb 4.5 oz (78.6 kg)  Filed Weights   02/23/16 1512  Weight: 173 lb 4.5 oz (78.6 kg)    GENERAL: Well-nourished well-developed; Alert, no distress and comfortable.   Alone.  EYES: no pallor or icterus OROPHARYNX: no thrush or ulceration;poor dentition.   NECK: supple, no masses felt LYMPH:  no palpable lymphadenopathy in the cervical, axillary or inguinal regions LUNGS: clear to auscultation and  No wheeze or crackles HEART/CVS: regular rate & rhythm and no murmurs; No lower extremity edema ABDOMEN:abdomen soft, non-tender and normal bowel sounds Musculoskeletal:no cyanosis of digits and no clubbing  PSYCH: alert & oriented x 3 with fluent speech NEURO: no focal motor/sensory deficits SKIN:  no rashes or significant lesions LEFT BREAST- Central mass noted; mobile; skin normal no ulceration or erythema noted.  LABORATORY DATA:  I have reviewed the  data as listed    Component Value Date/Time   NA 134* 02/23/2016 1419   NA 133* 04/08/2014 1738   K 4.0 02/23/2016 1419   K 4.2 04/08/2014 1738   CL 97* 02/23/2016 1419   CL 99 04/08/2014 1738   CO2 28 02/23/2016 1419   CO2 28 04/08/2014 1738   GLUCOSE 104* 02/23/2016 1419   GLUCOSE 116* 04/08/2014 1738   BUN 26* 02/23/2016 1419   BUN 5* 04/08/2014 1738   CREATININE 1.19* 02/23/2016 1419   CREATININE 0.42*  04/08/2014 1738   CALCIUM 9.1 02/23/2016 1419   CALCIUM 9.5 04/08/2014 1738   PROT 7.4 02/23/2016 1419   ALBUMIN 3.9 02/23/2016 1419   AST 14* 02/23/2016 1419   ALT 8* 02/23/2016 1419   ALKPHOS 61 02/23/2016 1419   BILITOT 0.3 02/23/2016 1419   GFRNONAA 46* 02/23/2016 1419   GFRNONAA >60 04/08/2014 1738   GFRAA 54* 02/23/2016 1419   GFRAA >60 04/08/2014 1738    No results found for: SPEP, UPEP  Lab Results  Component Value Date   WBC 5.9 02/23/2016   NEUTROABS 2.6 02/23/2016   HGB 9.2* 02/23/2016   HCT 27.0* 02/23/2016   MCV 97.8 02/23/2016   PLT 299 02/23/2016      Chemistry      Component Value Date/Time   NA 134* 02/23/2016 1419   NA 133* 04/08/2014 1738   K 4.0 02/23/2016 1419   K 4.2 04/08/2014 1738   CL 97* 02/23/2016 1419   CL 99 04/08/2014 1738   CO2 28 02/23/2016 1419   CO2 28 04/08/2014 1738   BUN 26* 02/23/2016 1419   BUN 5* 04/08/2014 1738   CREATININE 1.19* 02/23/2016 1419   CREATININE 0.42* 04/08/2014 1738      Component Value Date/Time   CALCIUM 9.1 02/23/2016 1419   CALCIUM 9.5 04/08/2014 1738   ALKPHOS 61 02/23/2016 1419   AST 14* 02/23/2016 1419   ALT 8* 02/23/2016 1419   BILITOT 0.3 02/23/2016 1419     IMPRESSION: 1. Today's study demonstrates a positive response to therapy. Specifically, there has been complete resolution of the hypermetabolic skin thickening in the left breast seen on the prior study, as well as resolution of left axillary, left supraclavicular, mediastinal and bilateral hilar lymphadenopathy. 2.  Additionally, previously noted enlarged hypermetabolic retroperitoneal lymph nodes have resolved. Although there continues to be some low-level metabolic activity within the adrenal glands bilaterally, there is no definite nodule to suggest metastatic disease to the adrenal glands. 3. Previously suspect is osseous lesions are no longer apparent on either PET or CT imaging. 4. Previously noted soft tissue prominence and hypermetabolism in the right ovary has resolved. 5. Asymmetric hypermetabolic activity in the region of the left pharyngeal tonsil, favored be physiologic. This should be amenable to direct inspection. 6. Although there continues to be some very mild bilateral hydronephrosis and perinephric stranding, this has improved compared to the prior study. No associated hydroureter, and no urinary tract calculi. 7. Atherosclerosis, including three-vessel coronary artery disease. Please note that although the presence of coronary artery calcium documents the presence of coronary artery disease, the severity of this disease and any potential stenosis cannot be assessed on this non-gated CT examination. Assessment for potential risk factor modification, dietary therapy or pharmacologic therapy may be warranted, if clinically indicated. 8. Additional incidental findings, as above.   Electronically Signed  By: Sophia Nelson M.D.  On: 12/20/2015 14:08  RADIOGRAPHIC STUDIES: I have personally reviewed the radiological images as listed and agreed with the findings in the report. No results found.   ASSESSMENT & PLAN:  Cancer of left breast ([place]) # Metastatic breast cancer/lobular ER/PR positive HER-2/neu negative- on ibrance and Femara . Significant response noted. Proceed with palliative mastectomy on the left side.   # HOLD IBRANCE  Starting today [given the concerns for cytopenia]; continue Letrozole thru surgery.   # Follow-up with me in approximately 4 weeks from  now; we will restart ibrance at the time   # 25 minutes face-to-face with the patient  discussing the above plan of care; more than 50% of time spent on prognosis/ natural history; counseling and coordination.       No orders of the defined types were placed in this encounter.   All questions were answered. The patient knows to call the clinic with any problems, questions or concerns.      [name], MD 02/24/2016 8:13 PM

## 2016-02-23 NOTE — Assessment & Plan Note (Addendum)
#  Metastatic breast cancer/lobular ER/PR positive HER-2/neu negative- on ibrance and Femara . Significant response noted. Proceed with palliative mastectomy on the left side.   # HOLD IBRANCE  Starting today [given the concerns for cytopenia]; continue Letrozole thru surgery.   # Follow-up with me in approximately 4 weeks from now; we will restart ibrance at the time   # 25 minutes face-to-face with the patient discussing the above plan of care; more than 50% of time spent on prognosis/ natural history; counseling and coordination.

## 2016-02-24 LAB — CANCER ANTIGEN 27.29: CA 27.29: 23.1 U/mL (ref 0.0–38.6)

## 2016-02-29 ENCOUNTER — Ambulatory Visit: Payer: Medicare HMO | Admitting: Anesthesiology

## 2016-02-29 ENCOUNTER — Encounter: Payer: Self-pay | Admitting: *Deleted

## 2016-02-29 ENCOUNTER — Ambulatory Visit
Admission: RE | Admit: 2016-02-29 | Discharge: 2016-02-29 | Disposition: A | Discharge status: Home / Self Care | Payer: Medicare HMO | Source: Ambulatory Visit | Attending: General Surgery | Admitting: General Surgery

## 2016-02-29 ENCOUNTER — Encounter
Admission: RE | Disposition: A | Discharge status: Home / Self Care | Payer: Self-pay | Source: Ambulatory Visit | Attending: General Surgery

## 2016-02-29 DIAGNOSIS — Z9981 Dependence on supplemental oxygen: Secondary | ICD-10-CM | POA: Insufficient documentation

## 2016-02-29 DIAGNOSIS — Z7982 Long term (current) use of aspirin: Secondary | ICD-10-CM | POA: Insufficient documentation

## 2016-02-29 DIAGNOSIS — J449 Chronic obstructive pulmonary disease, unspecified: Secondary | ICD-10-CM | POA: Diagnosis not present

## 2016-02-29 DIAGNOSIS — I10 Essential (primary) hypertension: Secondary | ICD-10-CM | POA: Diagnosis not present

## 2016-02-29 DIAGNOSIS — F17210 Nicotine dependence, cigarettes, uncomplicated: Secondary | ICD-10-CM | POA: Insufficient documentation

## 2016-02-29 DIAGNOSIS — E119 Type 2 diabetes mellitus without complications: Secondary | ICD-10-CM | POA: Insufficient documentation

## 2016-02-29 DIAGNOSIS — Z862 Personal history of diseases of the blood and blood-forming organs and certain disorders involving the immune mechanism: Secondary | ICD-10-CM | POA: Diagnosis not present

## 2016-02-29 DIAGNOSIS — Z79899 Other long term (current) drug therapy: Secondary | ICD-10-CM | POA: Insufficient documentation

## 2016-02-29 DIAGNOSIS — C773 Secondary and unspecified malignant neoplasm of axilla and upper limb lymph nodes: Secondary | ICD-10-CM | POA: Insufficient documentation

## 2016-02-29 DIAGNOSIS — C50812 Malignant neoplasm of overlapping sites of left female breast: Secondary | ICD-10-CM | POA: Diagnosis not present

## 2016-02-29 DIAGNOSIS — K219 Gastro-esophageal reflux disease without esophagitis: Secondary | ICD-10-CM | POA: Insufficient documentation

## 2016-02-29 DIAGNOSIS — M1990 Unspecified osteoarthritis, unspecified site: Secondary | ICD-10-CM | POA: Diagnosis not present

## 2016-02-29 DIAGNOSIS — C50912 Malignant neoplasm of unspecified site of left female breast: Secondary | ICD-10-CM | POA: Diagnosis present

## 2016-02-29 HISTORY — PX: SIMPLE MASTECTOMY WITH AXILLARY SENTINEL NODE BIOPSY: SHX6098

## 2016-02-29 LAB — GLUCOSE, CAPILLARY
GLUCOSE-CAPILLARY: 157 mg/dL — AB (ref 65–99)
Glucose-Capillary: 130 mg/dL — ABNORMAL HIGH (ref 65–99)

## 2016-02-29 SURGERY — SIMPLE MASTECTOMY
Anesthesia: General | Laterality: Left | Wound class: Clean Contaminated

## 2016-02-29 MED ORDER — GLYCOPYRROLATE 0.2 MG/ML IJ SOLN
INTRAMUSCULAR | Status: DC | PRN
  Administered 2016-02-29: .6 mg via INTRAVENOUS

## 2016-02-29 MED ORDER — ROCURONIUM BROMIDE 100 MG/10ML IV SOLN
INTRAVENOUS | Status: DC | PRN
Start: 2016-02-29 — End: 2016-02-29
  Administered 2016-02-29: 35 mg via INTRAVENOUS
  Administered 2016-02-29: 5 mg via INTRAVENOUS
  Administered 2016-02-29: 10 mg via INTRAVENOUS

## 2016-02-29 MED ORDER — FENTANYL CITRATE (PF) 100 MCG/2ML IJ SOLN
INTRAMUSCULAR | Status: AC
  Administered 2016-02-29: 25 ug via INTRAVENOUS
  Filled 2016-02-29: qty 2

## 2016-02-29 MED ORDER — SODIUM CHLORIDE 0.9 % IV SOLN
INTRAVENOUS | Status: DC
  Administered 2016-02-29 (×2): via INTRAVENOUS

## 2016-02-29 MED ORDER — OXYCODONE-ACETAMINOPHEN 5-325 MG PO TABS
ORAL_TABLET | ORAL | Status: AC
  Administered 2016-02-29: 1 via ORAL
  Filled 2016-02-29: qty 1

## 2016-02-29 MED ORDER — OXYCODONE-ACETAMINOPHEN 5-325 MG PO TABS: 1.0000 | ORAL_TABLET | ORAL | Status: DC | PRN

## 2016-02-29 MED ORDER — ONDANSETRON HCL 4 MG/2ML IJ SOLN
INTRAMUSCULAR | Status: DC | PRN
  Administered 2016-02-29: 4 mg via INTRAVENOUS

## 2016-02-29 MED ORDER — CEFAZOLIN SODIUM-DEXTROSE 2-4 GM/100ML-% IV SOLN
INTRAVENOUS | Status: AC
  Administered 2016-02-29: 2 g via INTRAVENOUS
  Filled 2016-02-29: qty 100

## 2016-02-29 MED ORDER — NEOSTIGMINE METHYLSULFATE 10 MG/10ML IV SOLN
INTRAVENOUS | Status: DC | PRN
  Administered 2016-02-29: 4 mg via INTRAVENOUS

## 2016-02-29 MED ORDER — MIDAZOLAM HCL 2 MG/2ML IJ SOLN
INTRAMUSCULAR | Status: DC | PRN
  Administered 2016-02-29: 1 mg via INTRAVENOUS

## 2016-02-29 MED ORDER — ACETAMINOPHEN 10 MG/ML IV SOLN
INTRAVENOUS | Status: AC
  Filled 2016-02-29: qty 100

## 2016-02-29 MED ORDER — CEFAZOLIN SODIUM-DEXTROSE 2-4 GM/100ML-% IV SOLN
2.0000 g | INTRAVENOUS | Status: AC
  Administered 2016-02-29: 2 g via INTRAVENOUS

## 2016-02-29 MED ORDER — EPHEDRINE SULFATE 50 MG/ML IJ SOLN
INTRAMUSCULAR | Status: DC | PRN
  Administered 2016-02-29 (×2): 10 mg via INTRAVENOUS

## 2016-02-29 MED ORDER — FENTANYL CITRATE (PF) 100 MCG/2ML IJ SOLN
25.0000 ug | INTRAMUSCULAR | Status: DC | PRN
  Administered 2016-02-29 (×4): 25 ug via INTRAVENOUS

## 2016-02-29 MED ORDER — SUCCINYLCHOLINE CHLORIDE 20 MG/ML IJ SOLN
INTRAMUSCULAR | Status: DC | PRN
  Administered 2016-02-29: 100 mg via INTRAVENOUS

## 2016-02-29 MED ORDER — DEXAMETHASONE SODIUM PHOSPHATE 10 MG/ML IJ SOLN
INTRAMUSCULAR | Status: DC | PRN
  Administered 2016-02-29: 10 mg via INTRAVENOUS

## 2016-02-29 MED ORDER — ONDANSETRON HCL 4 MG/2ML IJ SOLN: 4.0000 mg | Freq: Once | INTRAMUSCULAR | Status: DC | PRN

## 2016-02-29 MED ORDER — PROPOFOL 10 MG/ML IV BOLUS
INTRAVENOUS | Status: DC | PRN
  Administered 2016-02-29: 120 mg via INTRAVENOUS

## 2016-02-29 MED ORDER — LIDOCAINE HCL (CARDIAC) 20 MG/ML IV SOLN
INTRAVENOUS | Status: DC | PRN
  Administered 2016-02-29: 40 mg via INTRAVENOUS
  Administered 2016-02-29: 100 mg via INTRAVENOUS

## 2016-02-29 MED ORDER — PHENYLEPHRINE HCL 10 MG/ML IJ SOLN
INTRAMUSCULAR | Status: DC | PRN
  Administered 2016-02-29 (×2): 100 ug via INTRAVENOUS

## 2016-02-29 MED ORDER — FENTANYL CITRATE (PF) 100 MCG/2ML IJ SOLN
INTRAMUSCULAR | Status: DC | PRN
  Administered 2016-02-29: 25 ug via INTRAVENOUS
  Administered 2016-02-29: 50 ug via INTRAVENOUS
  Administered 2016-02-29: 100 ug via INTRAVENOUS
  Administered 2016-02-29: 25 ug via INTRAVENOUS

## 2016-02-29 MED ORDER — CHLORHEXIDINE GLUCONATE 4 % EX LIQD: 1.0000 "application " | Freq: Once | CUTANEOUS | Status: DC

## 2016-02-29 MED ORDER — METHYLPREDNISOLONE SODIUM SUCC 125 MG IJ SOLR
INTRAMUSCULAR | Status: DC | PRN
  Administered 2016-02-29: 125 mg via INTRAVENOUS

## 2016-02-29 MED ORDER — ACETAMINOPHEN 10 MG/ML IV SOLN
INTRAVENOUS | Status: DC | PRN
  Administered 2016-02-29: 1000 mg via INTRAVENOUS

## 2016-02-29 SURGICAL SUPPLY — 53 items
APPLIER CLIP 11 MED OPEN (CLIP)
APPLIER CLIP 13 LRG OPEN (CLIP)
BLADE SURG 10 STRL SS SAFETY (BLADE) ×3 IMPLANT
BULB RESERV EVAC DRAIN JP 100C (MISCELLANEOUS) ×6 IMPLANT
CANISTER SUCT 1200ML W/VALVE (MISCELLANEOUS) ×3 IMPLANT
CHLORAPREP W/TINT 26ML (MISCELLANEOUS) ×3 IMPLANT
CLIP APPLIE 11 MED OPEN (CLIP) IMPLANT
CLIP APPLIE 13 LRG OPEN (CLIP) IMPLANT
CNTNR SPEC 2.5X3XGRAD LEK (MISCELLANEOUS)
CONT SPEC 4OZ STER OR WHT (MISCELLANEOUS)
CONTAINER SPEC 2.5X3XGRAD LEK (MISCELLANEOUS) IMPLANT
DEVICE DISSECT PLASMABLAD 3.0S (MISCELLANEOUS) ×1 IMPLANT
DRAIN CHANNEL JP 15F RND 16 (MISCELLANEOUS) ×6 IMPLANT
DRAPE LAPAROTOMY TRNSV 106X77 (MISCELLANEOUS) ×3 IMPLANT
DRSG TEGADERM 2-3/8X2-3/4 SM (GAUZE/BANDAGES/DRESSINGS) ×6 IMPLANT
ELECT CAUTERY BLADE 6.4 (BLADE) ×3 IMPLANT
ELECT REM PT RETURN 9FT ADLT (ELECTROSURGICAL) ×3
ELECTRODE REM PT RTRN 9FT ADLT (ELECTROSURGICAL) ×1 IMPLANT
GAUZE FLUFF 18X24 1PLY STRL (GAUZE/BANDAGES/DRESSINGS) ×3 IMPLANT
GAUZE SPONGE 4X4 12PLY STRL (GAUZE/BANDAGES/DRESSINGS) IMPLANT
GAUZE SPONGE NON-WVN 2X2 STRL (MISCELLANEOUS) ×2 IMPLANT
GLOVE BIO SURGEON STRL SZ7 (GLOVE) ×18 IMPLANT
GOWN STRL REUS W/ TWL LRG LVL3 (GOWN DISPOSABLE) ×3 IMPLANT
GOWN STRL REUS W/TWL LRG LVL3 (GOWN DISPOSABLE) ×6
HARMONIC SCALPEL FOCUS (MISCELLANEOUS) IMPLANT
KIT RM TURNOVER STRD PROC AR (KITS) ×3 IMPLANT
LABEL OR SOLS (LABEL) ×3 IMPLANT
LIQUID BAND (GAUZE/BANDAGES/DRESSINGS) ×3 IMPLANT
PACK BASIN MINOR ARMC (MISCELLANEOUS) ×3 IMPLANT
PAD ABD DERMACEA PRESS 5X9 (GAUZE/BANDAGES/DRESSINGS) ×3 IMPLANT
PLASMABLADE 3.0S (MISCELLANEOUS) ×3
SLEVE PROBE SENORX GAMMA FIND (MISCELLANEOUS) IMPLANT
SPONGE LAP 18X18 5 PK (GAUZE/BANDAGES/DRESSINGS) ×6 IMPLANT
SPONGE VERSALON 2X2 STRL (MISCELLANEOUS) ×4
SURGI-BRA LG (MISCELLANEOUS) ×3 IMPLANT
SUT ETHILON 3-0 FS-10 30 BLK (SUTURE) ×6
SUT MNCRL AB 3-0 PS2 27 (SUTURE) ×6 IMPLANT
SUT SILK 2 0 (SUTURE) ×2
SUT SILK 2-0 18XBRD TIE 12 (SUTURE) ×1 IMPLANT
SUT SILK 3 0 (SUTURE) ×2
SUT SILK 3-0 18XBRD TIE 12 (SUTURE) ×1 IMPLANT
SUT SILK 4 0 (SUTURE) ×2
SUT SILK 4-0 18XBRD TIE 12 (SUTURE) ×1 IMPLANT
SUT VIC AB 2-0 CT1 (SUTURE) ×3 IMPLANT
SUT VIC AB 2-0 CT1 27 (SUTURE) ×4
SUT VIC AB 2-0 CT1 TAPERPNT 27 (SUTURE) ×2 IMPLANT
SUT VIC AB 3-0 SH 27 (SUTURE) ×2
SUT VIC AB 3-0 SH 27X BRD (SUTURE) ×1 IMPLANT
SUT VICRYL+ 3-0 144IN (SUTURE) ×3 IMPLANT
SUTURE EHLN 3-0 FS-10 30 BLK (SUTURE) ×2 IMPLANT
TUBING CONNECTING 10 (TUBING) ×2 IMPLANT
TUBING CONNECTING 10' (TUBING) ×1
WATER STERILE IRR 1000ML POUR (IV SOLUTION) ×3 IMPLANT

## 2016-02-29 NOTE — Transfer of Care (Signed)
Immediate Anesthesia Transfer of Care Note  Patient: Sophia Nelson  Procedure(s) Performed: Procedure(s): SIMPLE MASTECTOMY (Left)  Patient Location: PACU  Anesthesia Type:General  Level of Consciousness: awake  Airway & Oxygen Therapy: Patient Spontanous Breathing and Patient connected to face mask oxygen  Post-op Assessment: Report given to RN and Post -op Vital signs reviewed and stable  Post vital signs: Reviewed and stable  Last Vitals:  Filed Vitals:   02/29/16 0908  BP: 140/68  Pulse: 89  Temp: 36.6 C  Resp: 18    Last Pain:  Filed Vitals:   02/29/16 1223  PainSc: 0-No pain         Complications: No apparent anesthesia complications

## 2016-02-29 NOTE — Anesthesia Postprocedure Evaluation (Signed)
Anesthesia Post Note  Patient: Sophia Nelson  Procedure(s) Performed: Procedure(s) (LRB): SIMPLE MASTECTOMY (Left)  Patient location during evaluation: PACU Anesthesia Type: General Level of consciousness: awake and alert Pain management: pain level controlled Vital Signs Assessment: post-procedure vital signs reviewed and stable Respiratory status: spontaneous breathing, nonlabored ventilation, respiratory function stable and patient connected to nasal cannula oxygen Cardiovascular status: blood pressure returned to baseline and stable Postop Assessment: no signs of nausea or vomiting Anesthetic complications: no    Last Vitals:  Filed Vitals:   02/29/16 1332 02/29/16 1359  BP: 105/70 107/72  Pulse: 77 80  Temp: 35.9 C   Resp: 16     Last Pain:  Filed Vitals:   02/29/16 1359  PainSc: 5                  Sl[place] S

## 2016-02-29 NOTE — H&P (View-Only) (Signed)
Patient ID: Sophia Nelson, female   DOB: 12/08/1945, 70 y.o.   MRN: [id number]  Chief Complaint  Patient presents with  . Other    breast surgery    HPI Sophia Nelson is a 70 y.o. female here today to discuss breast surgery.  Patient reports left breast mass, but no bloody discharge, tenderness or pain.  She is here today with her mother, and her son lives with her. She uses oxygen via nasal canula, 2 L.  Pt has history of DCIS with right mastectomy. She initially presented with a very hard left breast mass involving the entire breast. She was diagnosed as Stage IV [T4 N1 M1] and has been on IBRANCE and [facility] with good response. I have reviewed the history of present illness with the patient.   HPI  Past Medical History  Diagnosis Date  . Hypertension   . Diabetes mellitus without complication ([place])   . GERD (gastroesophageal reflux disease)   . Breast cancer ([place]) 2009    RT MASTECTOMY, DCIS  . Cancer of left breast (Sophia Nelson) 08/09/2015    T4 N1 M1 tumor, INVASIVE LOBULAR CARCINOMA.    Past Surgical History  Procedure Laterality Date  . Tubal ligation    . Breast surgery Right 2015    mastectomy  . Abdominal hysterectomy      Family History  Problem Relation Age of Onset  . Breast cancer Neg Hx   . Lung cancer Father     Social History Social History  Substance Use Topics  . Smoking status: Current Every Day Smoker -- 1.00 packs/day for 15 years    Types: Cigarettes  . Smokeless tobacco: None  . Alcohol Use: No    No Known Allergies  Current Outpatient Prescriptions  Medication Sig Dispense Refill  . ADVAIR DISKUS 500-50 MCG/DOSE AEPB     . aspirin 81 MG tablet Take 81 mg by mouth daily.    . calcium-vitamin D 250-100 MG-UNIT tablet Take 1 tablet by mouth 2 (two) times daily.    . CHANTIX 1 MG tablet Take 1 mg by mouth daily.     . cyclobenzaprine (FLEXERIL) 10 MG tablet Take 1 tablet (10 mg total) by mouth at bedtime. 30 tablet 3  . fluticasone (FLONASE) 50  MCG/ACT nasal spray Place 2 sprays into both nostrils daily.    [name] GLIPIZIDE XL 2.5 MG 24 hr tablet Take 2.5 mg by mouth daily with breakfast.     . hydrochlorothiazide (MICROZIDE) 12.5 MG capsule Take 12.5 mg by mouth daily.     [name] letrozole (FEMARA) 2.5 MG tablet Take 1 tablet (2.5 mg total) by mouth daily. 90 tablet 3  . lisinopril (PRINIVIL,ZESTRIL) 40 MG tablet Take 40 mg by mouth daily.     . meloxicam (MOBIC) 7.5 MG tablet Take 7.5 mg by mouth daily.     . montelukast (SINGULAIR) 10 MG tablet Take 10 mg by mouth at bedtime.    [name] omeprazole (PRILOSEC) 20 MG capsule Take 20 mg by mouth daily.     . OXYGEN Inhale into the lungs.    . palbociclib (IBRANCE) 125 MG capsule Take 1 capsule (125 mg total) by mouth daily with breakfast. Take whole with food. Take for 21 days then 7 days off. 21 capsule 3  . SPIRIVA HANDIHALER 18 MCG inhalation capsule Place 18 mcg into inhaler and inhale.      No current facility-administered medications for this visit.    Review of Systems Review of Systems  Constitutional: Negative.   Respiratory: Negative.   Cardiovascular: Negative.   Gastrointestinal: Negative.     Blood pressure 144/74, pulse 90, resp. rate 17, height 5\' 3"  (1.6 m), weight 174 lb (78.926 kg).  Physical Exam Physical Exam  Constitutional: She is oriented to person, place, and time. She appears well-developed and well-nourished.  HENT:  Mouth/Throat: Oropharynx is clear and moist.  Eyes: Conjunctivae are normal. No scleral icterus.  Neck: Neck supple.  Cardiovascular: Normal rate, regular rhythm and normal heart sounds.   Pulmonary/Chest: Effort normal and breath sounds normal. Left breast exhibits mass. Left breast exhibits no inverted nipple, no nipple discharge, no skin change and no tenderness.    Left breast mass. Right mastectomy site intact.  Abdominal: Soft. Bowel sounds are normal. There is no tenderness.  Lymphadenopathy:    She has no cervical adenopathy.    She has  no axillary adenopathy.  Neurological: She is alert and oriented to person, place, and time.  Skin: Skin is warm and dry.  Psychiatric: Her behavior is normal.    Data Reviewed Prior notes.  Assessment    Left breast invasive lobular cancer.    Plan    Schedule surgery, left mastectomy. Pt has been explained the risks and benefits of procedure. This is a palliative procedure. Pt is agreeable to plan.     Patient is scheduled for surgery at [facility] on 02/29/16. She will pre admit by phone. We will obtain a pulmonary clearance for surgery. Patient is aware of date and instructions.   PCP:  [name] This information has been scribed by [name] CMA.    [name] G 02/01/2016, 3:56 PM

## 2016-02-29 NOTE — Progress Notes (Signed)
Spoke with Dr. Jamal Collin. Patient has previous right masectomy, no lymph nodes per patient removed. MD gave ok to start IV in right arm due to procedure today of left mastectomy.

## 2016-02-29 NOTE — Op Note (Signed)
Preop diagnosis: Carcinoma left breast  Post op diagnosis: Same  Operation: Left total mastectomy  Surgeon: Mckinley Jewel  Assistant:     Anesthesia: Gen.  Complications: None  EBL: 100 mL  Drains: 2 JP drains  Description: This patient had initially presented with the a large cancer occupying pretty much the entire left breast. Biopsy confirmed an invasive lobular carcinoma and subsequent imaging showed that this was a stage IV situation. She has been treated the of from that time with that chemotherapy and has responded extremely well. A total mastectomy was planned. Patient was brought to the operating room placed and put to sleep in supine position the operating table. Left breast and axilla were prepped and draped as sterile field and timeout performed. An elliptical incision was mapped out around the breast going from the medial to lateral aspect. Plasma knife was then utilized to make the skin incision and all dissection completed with the plasma knife. The superior flap was created towards the infraclavicular space in the course of the dissection it was noted that the breast was extremely vascular with some sizable vessels that required careful ligation and/or suture ligatures. The inferior flap was created to the upper rectus fascia. After this was completed the breast along with the underlying pectoralis fascia was dissected off the underlying muscle from the medial to lateral aspect. The lateral ends were then taken down and the lateral end of the breast was tagged for orientation and sent to pathology. The wound was irrigated and after ensuring hemostasis 2 drains were placed one medial and laterally brought out through stab incisions and fastened to the skin with nylon stitches. The subcutaneous tissue was then approximated with interrupted 2-0 Vicryl stitches. The skin was approximated with 2 running sutures of 3-0 Monocryl subcuticular layer. The incision was covered with the liquid  ban and Telfa fluffs and ABDs were placed as dressing. A surgical bra was then used to hold the dressing in place. Patient subsequently extubated and returned recovery room stable condition

## 2016-02-29 NOTE — Discharge Instructions (Signed)
AMBULATORY SURGERY  DISCHARGE INSTRUCTIONS   1) The drugs that you were given will stay in your system until tomorrow so for the next 24 hours you should not:  A) Drive an automobile B) Make any legal decisions C) Drink any alcoholic beverage   2) You may resume regular meals tomorrow.  Today it is better to start with liquids and gradually work up to solid foods.  You may eat anything you prefer, but it is better to start with liquids, then soup and crackers, and gradually work up to solid foods.   3) Please notify your doctor immediately if you have any unusual bleeding, trouble breathing, redness and pain at the surgery site, drainage, fever, or pain not relieved by medication. 4)   5) Your post-operative visit with Dr.                                     is: Date:                        Time:    Please call to schedule your post-operative visit.  6) Additional Instructions: AMBULATORY SURGERY  DISCHARGE INSTRUCTIONS   The drugs that you were given will stay in your system until tomorrow so for the next 24 hours you should not:  Drive an automobile Make any legal decisions Drink any alcoholic beverage   You may resume regular meals tomorrow.  Today it is better to start with liquids and gradually work up to solid foods.  You may eat anything you prefer, but it is better to start with liquids, then soup and crackers, and gradually work up to solid foods.   Please notify your doctor immediately if you have any unusual bleeding, trouble breathing, redness and pain at the surgery site, drainage, fever, or pain not relieved by medication.    Additional Instructions:        Please contact your physician with any problems or Same Day Surgery at [phone], Monday through Friday 6 am to 4 pm, or [place] at [facility] number at [phone].AMBULATORY SURGERY  DISCHARGE INSTRUCTIONS   The drugs that you were given will stay in your system until tomorrow so  for the next 24 hours you should not:  Drive an automobile Make any legal decisions Drink any alcoholic beverage   You may resume regular meals tomorrow.  Today it is better to start with liquids and gradually work up to solid foods.  You may eat anything you prefer, but it is better to start with liquids, then soup and crackers, and gradually work up to solid foods.   Please notify your doctor immediately if you have any unusual bleeding, trouble breathing, redness and pain at the surgery site, drainage, fever, or pain not relieved by medication.    Additional Instructions:        Please contact your physician with any problems or Same Day Surgery at [phone], Monday through Friday 6 am to 4 pm, or [place] at [facility] number at [phone].

## 2016-02-29 NOTE — Anesthesia Procedure Notes (Signed)
Procedure Name: Intubation Date/Time: 02/29/2016 10:08 AM Performed by: Allean Found Pre-anesthesia Checklist: Patient identified, Emergency Drugs available, Suction available, Patient being monitored and Timeout performed Patient Re-evaluated:Patient Re-evaluated prior to inductionOxygen Delivery Method: Circle system utilized Preoxygenation: Pre-oxygenation with 100% oxygen Intubation Type: IV induction Ventilation: Mask ventilation without difficulty Laryngoscope Size: Mac and 3 Grade View: Grade I Tube type: Oral Tube size: 7.0 mm Number of attempts: 1 Airway Equipment and Method: Stylet Placement Confirmation: ETT inserted through vocal cords under direct vision,  positive ETCO2 and breath sounds checked- equal and bilateral Secured at: 21 cm Tube secured with: Tape Dental Injury: Teeth and Oropharynx as per pre-operative assessment

## 2016-02-29 NOTE — Interval H&P Note (Signed)
History and Physical Interval Note:  02/29/2016 9:59 AM  [name]  has presented today for surgery, with the diagnosis of cancer left breast  The various methods of treatment have been discussed with the patient and family. After consideration of risks, benefits and other options for treatment, the patient has consented to  Procedure(s): SIMPLE MASTECTOMY (Left) as a surgical intervention .  The patient's history has been reviewed, patient examined, no change in status, stable for surgery.  I have reviewed the patient's chart and labs.  Questions were answered to the patient's satisfaction.     Sophia Nelson

## 2016-02-29 NOTE — Anesthesia Preprocedure Evaluation (Addendum)
Anesthesia Evaluation  Patient identified by MRN, date of birth, ID band Patient awake    Reviewed: Allergy & Precautions, NPO status , Patient's Chart, lab work & pertinent test results  Airway Mallampati: III  TM Distance: <3 FB     Dental  (+) Upper Dentures   Pulmonary shortness of breath and with exertion, pneumonia, resolved, COPD,  COPD inhaler and oxygen dependent, Current Smoker,  Prn O2 at home   Pulmonary exam normal        Cardiovascular hypertension, Pt. on medications      Neuro/Psych  Headaches, negative psych ROS   GI/Hepatic Neg liver ROS, GERD  Medicated and Controlled,  Endo/Other  diabetes, Well Controlled, Type 2, Oral Hypoglycemic Agents  Renal/GU negative Renal ROS  negative genitourinary   Musculoskeletal  (+) Arthritis , Osteoarthritis,    Abdominal Normal abdominal exam  (+)   Peds negative pediatric ROS (+)  Hematology  (+) anemia ,   Anesthesia Other Findings   Reproductive/Obstetrics                        Anesthesia Physical Anesthesia Plan  ASA: III  Anesthesia Plan: General   Post-op Pain Management:    Induction: Intravenous  Airway Management Planned: Oral ETT and LMA  Additional Equipment:   Intra-op Plan:   Post-operative Plan: Extubation in OR  Informed Consent: I have reviewed the patients History and Physical, chart, labs and discussed the procedure including the risks, benefits and alternatives for the proposed anesthesia with the patient or authorized representative who has indicated his/her understanding and acceptance.   Dental advisory given  Plan Discussed with: CRNA and Surgeon  Anesthesia Plan Comments:       Anesthesia Quick Evaluation

## 2016-02-29 NOTE — Progress Notes (Signed)
Pt states pain is improved and wants to go home

## 2016-03-02 ENCOUNTER — Ambulatory Visit: Payer: Medicare HMO

## 2016-03-02 ENCOUNTER — Telehealth: Payer: Self-pay

## 2016-03-02 NOTE — Telephone Encounter (Signed)
Patient called to say that her drains were not producing very much fluid. She also states that this morning she woke up and had some fluid leakage from one of her drains. I asked if she had been striping her drains and she said they only had cleared some clots and had not been doing this. I spoke with her son and reviewed the proper technique for clearing her drains and how to empty them properly. I asked the patient to come into the office today and I could look at her drains and re instruct them on proper maintenance. The patient's son said he will bring her in today to be seen.

## 2016-03-02 NOTE — Telephone Encounter (Signed)
The patient called back and said her other son came over and helped her strip the drains. She says they are now both draining properly. She said she would like to wait until Monday to come in to see the doctor at her already scheduled time. I let her know to watch her output on the drains and to call if she has any further leakage or problems. The patient was amendable to this. Dr Jamal Collin notified.

## 2016-03-05 ENCOUNTER — Encounter: Payer: Self-pay | Admitting: General Surgery

## 2016-03-05 ENCOUNTER — Ambulatory Visit (INDEPENDENT_AMBULATORY_CARE_PROVIDER_SITE_OTHER): Payer: Medicare HMO | Admitting: General Surgery

## 2016-03-05 VITALS — BP 122/50 | Resp 18 | Ht 60.0 in | Wt 172.0 lb

## 2016-03-05 DIAGNOSIS — C50912 Malignant neoplasm of unspecified site of left female breast: Secondary | ICD-10-CM

## 2016-03-05 NOTE — Patient Instructions (Addendum)
Patient to call Wednesday to let us know what drainage is .

## 2016-03-05 NOTE — Progress Notes (Signed)
This is a 70 year old female here today for her post op left mastectomy done on 02/29/16. Patient states she is doing well. I have reviewed the history of present illness with the patient.   Drain sheet present. Medial drain  Has had very little drainage, lateral drain with moderate serosanguinous drainage. Drain number 2 was removed.Patient to call Wednesday to let us know what drainage is . See MD in one week.          PCP:  [place] This information has been scribed by [name] CMA.

## 2016-03-06 ENCOUNTER — Encounter: Payer: Self-pay | Admitting: General Surgery

## 2016-03-13 ENCOUNTER — Encounter: Payer: Self-pay | Admitting: General Surgery

## 2016-03-13 ENCOUNTER — Ambulatory Visit (INDEPENDENT_AMBULATORY_CARE_PROVIDER_SITE_OTHER): Payer: Medicare HMO | Admitting: General Surgery

## 2016-03-13 VITALS — BP 132/74 | HR 110 | Resp 18 | Ht 60.0 in | Wt 172.0 lb

## 2016-03-13 DIAGNOSIS — C50912 Malignant neoplasm of unspecified site of left female breast: Secondary | ICD-10-CM

## 2016-03-13 NOTE — Progress Notes (Signed)
Sophia Nelson is a 70 year old female here today for her post op left mastectomy done on 02/29/16. Patient states she is doing well.  Drain sheet present. Noted that remaining drain varies from a low of 72mL to a high of 68mL in the last several days. Drain to remain. Patient to call in 2 days to report the amount of drainage. I have reviewed the history of present illness with the patient.   Incision healing well, without erythema or exudate.  Subsequent follow up after drain removal in approximately 2 weeks.  PCP:  Clide Deutscher, This information has been scribed by [name] CMA.

## 2016-03-13 NOTE — Patient Instructions (Signed)
Patient to call us on Thursday.

## 2016-03-16 ENCOUNTER — Ambulatory Visit (INDEPENDENT_AMBULATORY_CARE_PROVIDER_SITE_OTHER): Payer: Medicare HMO

## 2016-03-16 DIAGNOSIS — C50912 Malignant neoplasm of unspecified site of left female breast: Secondary | ICD-10-CM

## 2016-03-16 NOTE — Progress Notes (Signed)
Patient came in today for a wound check. Her drainage has been less than 30 cc for the past 3 days. Drain removed with out difficulty. Patient aware of follow up appointment.   PCP: [name]

## 2016-03-22 ENCOUNTER — Other Ambulatory Visit: Payer: Self-pay | Admitting: *Deleted

## 2016-03-22 ENCOUNTER — Inpatient Hospital Stay (HOSPITAL_BASED_OUTPATIENT_CLINIC_OR_DEPARTMENT_OTHER): Payer: Medicare HMO | Admitting: Internal Medicine

## 2016-03-22 ENCOUNTER — Inpatient Hospital Stay: Payer: Medicare HMO | Attending: Internal Medicine

## 2016-03-22 DIAGNOSIS — Z170 Estrogen receptor positive status [ER+]: Secondary | ICD-10-CM

## 2016-03-22 DIAGNOSIS — Z7982 Long term (current) use of aspirin: Secondary | ICD-10-CM | POA: Insufficient documentation

## 2016-03-22 DIAGNOSIS — C50912 Malignant neoplasm of unspecified site of left female breast: Principal | ICD-10-CM

## 2016-03-22 DIAGNOSIS — I10 Essential (primary) hypertension: Secondary | ICD-10-CM | POA: Insufficient documentation

## 2016-03-22 DIAGNOSIS — I2510 Atherosclerotic heart disease of native coronary artery without angina pectoris: Secondary | ICD-10-CM | POA: Insufficient documentation

## 2016-03-22 DIAGNOSIS — E119 Type 2 diabetes mellitus without complications: Secondary | ICD-10-CM | POA: Diagnosis not present

## 2016-03-22 DIAGNOSIS — J449 Chronic obstructive pulmonary disease, unspecified: Secondary | ICD-10-CM

## 2016-03-22 DIAGNOSIS — F17210 Nicotine dependence, cigarettes, uncomplicated: Secondary | ICD-10-CM | POA: Diagnosis not present

## 2016-03-22 DIAGNOSIS — Z79811 Long term (current) use of aromatase inhibitors: Secondary | ICD-10-CM | POA: Diagnosis not present

## 2016-03-22 DIAGNOSIS — Z9012 Acquired absence of left breast and nipple: Secondary | ICD-10-CM

## 2016-03-22 DIAGNOSIS — G8918 Other acute postprocedural pain: Secondary | ICD-10-CM | POA: Insufficient documentation

## 2016-03-22 DIAGNOSIS — C50911 Malignant neoplasm of unspecified site of right female breast: Secondary | ICD-10-CM

## 2016-03-22 DIAGNOSIS — C50812 Malignant neoplasm of overlapping sites of left female breast: Secondary | ICD-10-CM

## 2016-03-22 DIAGNOSIS — Z79899 Other long term (current) drug therapy: Secondary | ICD-10-CM | POA: Insufficient documentation

## 2016-03-22 DIAGNOSIS — K219 Gastro-esophageal reflux disease without esophagitis: Secondary | ICD-10-CM | POA: Insufficient documentation

## 2016-03-22 DIAGNOSIS — M1990 Unspecified osteoarthritis, unspecified site: Secondary | ICD-10-CM | POA: Diagnosis not present

## 2016-03-22 HISTORY — DX: Malignant neoplasm of overlapping sites of left female breast: C50.812

## 2016-03-22 HISTORY — DX: Estrogen receptor positive status (ER+): Z17.0

## 2016-03-22 LAB — COMPREHENSIVE METABOLIC PANEL
ALK PHOS: 71 U/L (ref 38–126)
ALT: 10 U/L — ABNORMAL LOW (ref 14–54)
ANION GAP: 7 (ref 5–15)
AST: 15 U/L (ref 15–41)
Albumin: 3.9 g/dL (ref 3.5–5.0)
BILIRUBIN TOTAL: 0.6 mg/dL (ref 0.3–1.2)
BUN: 19 mg/dL (ref 6–20)
CALCIUM: 9.8 mg/dL (ref 8.9–10.3)
CO2: 27 mmol/L (ref 22–32)
CREATININE: 1.07 mg/dL — AB (ref 0.44–1.00)
Chloride: 101 mmol/L (ref 101–111)
GFR calc non Af Amer: 52 mL/min — ABNORMAL LOW (ref >60)
GLUCOSE: 127 mg/dL — AB (ref 65–99)
Potassium: 4 mmol/L (ref 3.5–5.1)
SODIUM: 135 mmol/L (ref 135–145)
Total Protein: 7.7 g/dL (ref 6.5–8.1)

## 2016-03-22 LAB — CBC WITH DIFFERENTIAL/PLATELET
Basophils Absolute: 0.1 10*3/uL (ref 0–0.1)
Basophils Relative: 3 %
EOS ABS: 0.1 10*3/uL (ref 0–0.7)
Eosinophils Relative: 2 %
HEMATOCRIT: 26.6 % — AB (ref 35.0–47.0)
HEMOGLOBIN: 9 g/dL — AB (ref 12.0–16.0)
LYMPHS ABS: 1.7 10*3/uL (ref 1.0–3.6)
Lymphocytes Relative: 40 %
MCH: 32.6 pg (ref 26.0–34.0)
MCHC: 33.7 g/dL (ref 32.0–36.0)
MCV: 96.7 fL (ref 80.0–100.0)
MONO ABS: 0.3 10*3/uL (ref 0.2–0.9)
MONOS PCT: 6 %
NEUTROS PCT: 49 %
Neutro Abs: 2 10*3/uL (ref 1.4–6.5)
Platelets: 269 10*3/uL (ref 150–440)
RBC: 2.75 MIL/uL — ABNORMAL LOW (ref 3.80–5.20)
RDW: 15.7 % — AB (ref 11.5–14.5)
WBC: 4.2 10*3/uL (ref 3.6–11.0)

## 2016-03-22 NOTE — Assessment & Plan Note (Addendum)
#  Metastatic breast cancer/lobular ER/PR positive HER-2/neu negative- on ibrance and Femara . Significant response noted. s/p palliative mastectomy on the left side- pathology reviewed significant treatment response noted; that the evidence of microscopic disease/lymph node involvement. We will check with radiation oncology regarding possible radiation.  # Restart ibrance-continue letrozole.     # Follow-up with me in approximately 4 weeks from now; we will restart ibrance at the time   # 25 minutes face-to-face with the patient discussing the above plan of care; more than 50% of time spent on prognosis/ natural history; counseling and coordination.

## 2016-03-22 NOTE — Progress Notes (Signed)
Patient had surgery on July 5th.  States surgical site is still very tender.  Otherwise no complaints.

## 2016-03-22 NOTE — Progress Notes (Signed)
[place] OFFICE PROGRESS NOTE  Patient Care Team: Donnie Coffin, MD as PCP - General (Family Medicine) Donnie Coffin, MD as Referring Physician (Family Medicine) Seeplaputhur Robinette Haines, MD (General Surgery) Forest Gleason, MD (Oncology)  Cancer of left breast [facility])   Staging form: Breast, AJCC 7th Edition     Clinical: Stage IIIB (T4, N1, cM0(i+)) - Signed by Forest Gleason, MD on 08/09/2015     Pathologic: Stage IV (T4, N1, M1) - Signed by Forest Gleason, MD on 08/16/2015    Oncology History   # DEC 2016-  LEFT BREAST CA- LOBULAR CA ER/PR-Pos; her 2 NEG STAGE IV [[id number]- left breast/bil Ax LN/Media LN/RP LN; skeletal mets]; DEC 2016- LETROZOLE + IBRANCE; April 2017- Significant response to treatment- July plan pal mastec [Dr.Sankar]; s/p Left mastec & ALND [palliative]; cont Leslee Home + Letrzole  # RIGHT BREAST CA [s/p mastectomy [facility]]     Cancer of left breast ([place])   08/09/2015 Initial Diagnosis    Cancer of left breast (HCC)      Cancer of overlapping sites of left female breast [facility])      INTERVAL HISTORY:  Sophia Nelson 70 y.o.  female pleasant patient above history of Metastatic breast cancer lobular carcinoma currently on ibrance and letrozole- Currently status post palliative left mastectomy is here for follow-up.  Patient complains of mild pain at the mastectomy site. Otherwise denies any significant postoperative complications.   Appetite is good. No weight loss. No nausea no vomiting. No chest pain. No fevers. No bone pain. She has been compliant with her medications.   REVIEW OF SYSTEMS:  A complete 10 point review of system is done which is negative except mentioned above/history of present illness.   PAST MEDICAL HISTORY :  Past Medical History:  Diagnosis Date  . Anemia   . Arthritis   . Breast cancer ([place]) 2009   RT MASTECTOMY, DCIS  . Cancer of left breast ([place]) 08/09/2015   T4 N1 M1 tumor, INVASIVE LOBULAR CARCINOMA.  [name] COPD (chronic  obstructive pulmonary disease) ([place])   . Diabetes mellitus without complication ([place])   . GERD (gastroesophageal reflux disease)   . Headache    h/o migraines as a child  . Hypertension   . Pneumonia 2015  . Shortness of breath dyspnea    with exertion    PAST SURGICAL HISTORY :   Past Surgical History:  Procedure Laterality Date  . ABDOMINAL HYSTERECTOMY    . BREAST SURGERY Right 2015   mastectomy  . SIMPLE MASTECTOMY WITH AXILLARY SENTINEL NODE BIOPSY Left 02/29/2016   Procedure: SIMPLE MASTECTOMY;  Surgeon: Christene Lye, MD;  Location: [facility] ORS;  Service: General;  Laterality: Left;  . TUBAL LIGATION      FAMILY HISTORY :   Family History  Problem Relation Age of Onset  . Breast cancer Neg Hx   . Lung cancer Father     SOCIAL HISTORY:   Social History  Substance Use Topics  . Smoking status: Current Some Day Smoker    Packs/day: 0.25    Years: 15.00    Types: Cigarettes  . Smokeless tobacco: Not on file     Comment: currently as of 02-21-16 pt states she is only smoking 2-3 cigarettes per day  . 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  MEDICATIONS:  Current Outpatient Prescriptions  Medication Sig Dispense Refill  . ADVAIR DISKUS 500-50 MCG/DOSE AEPB Inhale 1 puff into the lungs 2 (two) times daily.     [name] albuterol (PROVENTIL HFA;VENTOLIN HFA) 108 (90 Base) MCG/ACT inhaler Inhale 2 puffs into the lungs every 6 (six) hours as needed for wheezing or shortness of breath.    [name] aspirin 81 MG tablet Take 81 mg by mouth daily.    . calcium carbonate (OSCAL) 1500 (600 Ca) MG TABS tablet Take 1,500 mg by mouth 2 (two) times daily with a meal.    . CHANTIX 1 MG tablet Take 1 mg by mouth 2 (two) times daily.     . cyclobenzaprine (FLEXERIL) 10 MG tablet Take 1 tablet (10 mg total) by mouth at bedtime. 30 tablet 3  . docusate sodium  (COLACE) 100 MG capsule Take 100 mg by mouth 2 (two) times daily.    . fluticasone (FLONASE) 50 MCG/ACT nasal spray Place 2 sprays into both nostrils daily.    [name] GLIPIZIDE XL 2.5 MG 24 hr tablet Take 2.5 mg by mouth daily with breakfast.     . hydrochlorothiazide (MICROZIDE) 12.5 MG capsule Take 12.5 mg by mouth daily.     [name] letrozole (FEMARA) 2.5 MG tablet Take 1 tablet (2.5 mg total) by mouth daily. 90 tablet 3  . lisinopril (PRINIVIL,ZESTRIL) 40 MG tablet Take 40 mg by mouth every morning.     . montelukast (SINGULAIR) 10 MG tablet Take 10 mg by mouth at bedtime.    [name] omeprazole (PRILOSEC) 20 MG capsule Take 20 mg by mouth every morning.     [name] oxyCODONE-acetaminophen (ROXICET) 5-325 MG tablet Take 1 tablet by mouth every 4 (four) hours as needed. 30 tablet 0  . OXYGEN Inhale 2 L into the lungs continuous.     . palbociclib (IBRANCE) 125 MG capsule Take 1 capsule (125 mg total) by mouth daily with breakfast. Take whole with food. Take for 21 days then 7 days off. 21 capsule 3  . SPIRIVA HANDIHALER 18 MCG inhalation capsule Place 18 mcg into inhaler and inhale every morning.      No current facility-administered medications for this visit.     PHYSICAL EXAMINATION: ECOG PERFORMANCE STATUS: 0 - Asymptomatic  BP (!) 148/75 (BP Location: Right Arm, Patient Position: Sitting)   Pulse (!) 102   Temp 97.3 F (36.3 C) (Tympanic)   Resp 18   Wt 170 lb 1 oz (77.1 kg)   BMI 33.21 kg/m   Filed Weights   03/22/16 1537  Weight: 170 lb 1 oz (77.1 kg)    GENERAL: Well-nourished well-developed; Alert, no distress and comfortable.   Alone.  EYES: no pallor or icterus OROPHARYNX: no thrush or ulceration;poor dentition.   NECK: supple, no masses felt LYMPH:  no palpable lymphadenopathy in the cervical, axillary or inguinal regions LUNGS: clear to auscultation and  No wheeze or crackles HEART/CVS: regular rate & rhythm and no murmurs; No lower extremity edema ABDOMEN:abdomen soft, non-tender and  normal bowel sounds Musculoskeletal:no cyanosis of digits and no clubbing  PSYCH: alert & oriented x 3 with fluent speech NEURO: no focal motor/sensory deficits SKIN:  no rashes or significant lesions   LABORATORY DATA:  I have reviewed the data as listed    Component Value Date/Time   NA 135 03/22/2016 1509   NA 133 (L) 04/08/2014 1738   K 4.0 03/22/2016 1509   K 4.2 04/08/2014 1738   CL 101 03/22/2016 1509  CL 99 04/08/2014 1738   CO2 27 03/22/2016 1509   CO2 28 04/08/2014 1738   GLUCOSE 127 (H) 03/22/2016 1509   GLUCOSE 116 (H) 04/08/2014 1738   BUN 19 03/22/2016 1509   BUN 5 (L) 04/08/2014 1738   CREATININE 1.07 (H) 03/22/2016 1509   CREATININE 0.42 (L) 04/08/2014 1738   CALCIUM 9.8 03/22/2016 1509   CALCIUM 9.5 04/08/2014 1738   PROT 7.7 03/22/2016 1509   ALBUMIN 3.9 03/22/2016 1509   AST 15 03/22/2016 1509   ALT 10 (L) 03/22/2016 1509   ALKPHOS 71 03/22/2016 1509   BILITOT 0.6 03/22/2016 1509   GFRNONAA 52 (L) 03/22/2016 1509   GFRNONAA >60 04/08/2014 1738   GFRAA >60 03/22/2016 1509   GFRAA >60 04/08/2014 1738    No results found for: SPEP, UPEP  Lab Results  Component Value Date   WBC 4.2 03/22/2016   NEUTROABS 2.0 03/22/2016   HGB 9.0 (L) 03/22/2016   HCT 26.6 (L) 03/22/2016   MCV 96.7 03/22/2016   PLT 269 03/22/2016      Chemistry      Component Value Date/Time   NA 135 03/22/2016 1509   NA 133 (L) 04/08/2014 1738   K 4.0 03/22/2016 1509   K 4.2 04/08/2014 1738   CL 101 03/22/2016 1509   CL 99 04/08/2014 1738   CO2 27 03/22/2016 1509   CO2 28 04/08/2014 1738   BUN 19 03/22/2016 1509   BUN 5 (L) 04/08/2014 1738   CREATININE 1.07 (H) 03/22/2016 1509   CREATININE 0.42 (L) 04/08/2014 1738      Component Value Date/Time   CALCIUM 9.8 03/22/2016 1509   CALCIUM 9.5 04/08/2014 1738   ALKPHOS 71 03/22/2016 1509   AST 15 03/22/2016 1509   ALT 10 (L) 03/22/2016 1509   BILITOT 0.6 03/22/2016 1509     IMPRESSION: 1. Today's study demonstrates  a positive response to therapy. Specifically, there has been complete resolution of the hypermetabolic skin thickening in the left breast seen on the prior study, as well as resolution of left axillary, left supraclavicular, mediastinal and bilateral hilar lymphadenopathy. 2. Additionally, previously noted enlarged hypermetabolic retroperitoneal lymph nodes have resolved. Although there continues to be some low-level metabolic activity within the adrenal glands bilaterally, there is no definite nodule to suggest metastatic disease to the adrenal glands. 3. Previously suspect is osseous lesions are no longer apparent on either PET or CT imaging. 4. Previously noted soft tissue prominence and hypermetabolism in the right ovary has resolved. 5. Asymmetric hypermetabolic activity in the region of the left pharyngeal tonsil, favored be physiologic. This should be amenable to direct inspection. 6. Although there continues to be some very mild bilateral hydronephrosis and perinephric stranding, this has improved compared to the prior study. No associated hydroureter, and no urinary tract calculi. 7. Atherosclerosis, including three-vessel coronary artery disease. Please note that although the presence of coronary artery calcium documents the presence of coronary artery disease, the severity of this disease and any potential stenosis cannot be assessed on this non-gated CT examination. Assessment for potential risk factor modification, dietary therapy or pharmacologic therapy may be warranted, if clinically indicated. 8. Additional incidental findings, as above.   Electronically Signed  By: Vinnie Langton M.D.  On: 12/20/2015 14:08  RADIOGRAPHIC STUDIES: I have personally reviewed the radiological images as listed and agreed with the findings in the report. No results found.   ASSESSMENT & PLAN:  Cancer of overlapping sites of left female breast [facility]) # Metastatic  breast  cancer/lobular ER/PR positive HER-2/neu negative- on ibrance and Femara . Significant response noted. s/p palliative mastectomy on the left side- pathology reviewed significant treatment response noted; that the evidence of microscopic disease/lymph node involvement. We will check with radiation oncology regarding possible radiation.  # Restart ibrance-continue letrozole.     # Follow-up with me in approximately 4 weeks from now; we will restart ibrance at the time   # 25 minutes face-to-face with the patient discussing the above plan of care; more than 50% of time spent on prognosis/ natural history; counseling and coordination.   No orders of the defined types were placed in this encounter.  All questions were answered. The patient knows to call the clinic with any problems, questions or concerns.      [name], MD 03/22/2016 5:39 PM

## 2016-03-29 ENCOUNTER — Ambulatory Visit: Payer: Medicare HMO | Admitting: General Surgery

## 2016-04-04 ENCOUNTER — Ambulatory Visit (INDEPENDENT_AMBULATORY_CARE_PROVIDER_SITE_OTHER): Payer: Medicare HMO | Admitting: General Surgery

## 2016-04-04 ENCOUNTER — Encounter: Payer: Self-pay | Admitting: General Surgery

## 2016-04-04 VITALS — BP 130/72 | HR 72 | Resp 16 | Ht 62.0 in | Wt 171.0 lb

## 2016-04-04 DIAGNOSIS — D0511 Intraductal carcinoma in situ of right breast: Secondary | ICD-10-CM

## 2016-04-04 DIAGNOSIS — C50912 Malignant neoplasm of unspecified site of left female breast: Secondary | ICD-10-CM

## 2016-04-04 NOTE — Patient Instructions (Signed)
Patient to return as needed. Patient to follow up with oncology.

## 2016-04-04 NOTE — Progress Notes (Signed)
Patient ID: Sophia Nelson, female   DOB: February 03, 1946, 70 y.o.   MRN: [id number]  Chief Complaint  Patient presents with  . Follow-up    Mastectomy    HPI Sophia Nelson is a 70 y.o. female here today for a follow up on a left mastectomy done on 02/29/16 for invasive lobular carcinoma (T4 N1 M1). Patient still taking Femara and Ibrance and tolerating the medications well. Patient states she is doing well overall and has no new complaints.  I have reviewed the history of present illness with the patient.  HPI  Past Medical History:  Diagnosis Date  . Anemia   . Arthritis   . Breast cancer ([place]) 2009   RT MASTECTOMY, DCIS  . Cancer of left breast ([place]) 08/09/2015   T4 N1 M1 tumor, INVASIVE LOBULAR CARCINOMA.  [name] COPD (chronic obstructive pulmonary disease) ([place])   . Diabetes mellitus without complication ([place])   . GERD (gastroesophageal reflux disease)   . Headache    h/o migraines as a child  . Hypertension   . Pneumonia 2015  . Shortness of breath dyspnea    with exertion    Past Surgical History:  Procedure Laterality Date  . ABDOMINAL HYSTERECTOMY    . BREAST SURGERY Right 2015   mastectomy  . SIMPLE MASTECTOMY WITH AXILLARY SENTINEL NODE BIOPSY Left 02/29/2016   Procedure: SIMPLE MASTECTOMY;  Surgeon: Christene Lye, MD;  Location: [facility] ORS;  Service: General;  Laterality: Left;  . TUBAL LIGATION      Family History  Problem Relation Age of Onset  . Breast cancer Neg Hx   . Lung cancer Father     Social History Social History  Substance Use Topics  . Smoking status: Current Some Day Smoker    Packs/day: 0.25    Years: 15.00    Types: Cigarettes  . Smokeless tobacco: Not on file     Comment: currently as of 02-21-16 pt states she is only smoking 2-3 cigarettes per day  . 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 Other    Onions(that grow in the yard-pt can eat onions without problems) and dust mite Sneezing, cough, runny nose    Current Outpatient Prescriptions  Medication Sig Dispense Refill  . ADVAIR DISKUS 500-50 MCG/DOSE AEPB Inhale 1 puff into the lungs 2 (two) times daily.     [name] albuterol (PROVENTIL HFA;VENTOLIN HFA) 108 (90 Base) MCG/ACT inhaler Inhale 2 puffs into the lungs every 6 (six) hours as needed for wheezing or shortness of breath.    [name] aspirin 81 MG tablet Take 81 mg by mouth daily.    . calcium carbonate (OSCAL) 1500 (600 Ca) MG TABS tablet Take 1,500 mg by mouth 2 (two) times daily with a meal.    . CHANTIX 1 MG tablet Take 1 mg by mouth 2 (two) times daily.     . cyclobenzaprine (FLEXERIL) 10 MG tablet Take 1 tablet (10 mg total) by mouth at bedtime. 30 tablet 3  . docusate sodium (COLACE) 100 MG capsule Take 100 mg by mouth 2 (two) times daily.    . fluticasone (FLONASE) 50 MCG/ACT nasal spray Place 2 sprays into both nostrils daily.    [name] GLIPIZIDE XL 2.5 MG 24 hr tablet Take 2.5 mg by mouth daily with breakfast.     . hydrochlorothiazide (  MICROZIDE) 12.5 MG capsule Take 12.5 mg by mouth daily.     [name] letrozole (FEMARA) 2.5 MG tablet Take 1 tablet (2.5 mg total) by mouth daily. 90 tablet 3  . lisinopril (PRINIVIL,ZESTRIL) 40 MG tablet Take 40 mg by mouth every morning.     . montelukast (SINGULAIR) 10 MG tablet Take 10 mg by mouth at bedtime.    [name] omeprazole (PRILOSEC) 20 MG capsule Take 20 mg by mouth every morning.     [name] oxyCODONE-acetaminophen (ROXICET) 5-325 MG tablet Take 1 tablet by mouth every 4 (four) hours as needed. 30 tablet 0  . OXYGEN Inhale 2 L into the lungs continuous.     . palbociclib (IBRANCE) 125 MG capsule Take 1 capsule (125 mg total) by mouth daily with breakfast. Take whole with food. Take for 21 days then 7 days off. 21 capsule 3  . SPIRIVA HANDIHALER 18 MCG inhalation capsule Place 18 mcg into inhaler and inhale every morning.      No current  facility-administered medications for this visit.     Review of Systems Review of Systems  Constitutional: Negative.   Respiratory: Negative.   Cardiovascular: Negative.     Blood pressure 130/72, pulse 72, resp. rate 16, height 5\' 2"  (1.575 m), weight 171 lb (77.6 kg).  Physical Exam Physical Exam  Constitutional: She is oriented to person, place, and time. She appears well-developed and well-nourished.  Eyes: Conjunctivae are normal. No scleral icterus.  Neck: Neck supple.  Cardiovascular: Normal rate and normal heart sounds.   Pulmonary/Chest: Effort normal and breath sounds normal.    Lymphadenopathy:    She has no cervical adenopathy.  Neurological: She is alert and oriented to person, place, and time.  Skin: Skin is warm and dry.    Data Reviewed Prior notes.  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    Plan   Patient provided with prescription for breast prostheses Patient to return as needed. Patient to follow up with oncology.  This information has been scribed by [name] CMA.        SANKAR,SEEPLAPUTHUR G 04/04/2016, 3:57 PM

## 2016-04-19 ENCOUNTER — Inpatient Hospital Stay: Payer: Medicare HMO | Attending: Internal Medicine

## 2016-04-19 ENCOUNTER — Inpatient Hospital Stay (HOSPITAL_BASED_OUTPATIENT_CLINIC_OR_DEPARTMENT_OTHER): Payer: Medicare HMO | Admitting: Internal Medicine

## 2016-04-19 ENCOUNTER — Encounter (INDEPENDENT_AMBULATORY_CARE_PROVIDER_SITE_OTHER): Payer: Self-pay

## 2016-04-19 ENCOUNTER — Other Ambulatory Visit: Payer: Self-pay

## 2016-04-19 ENCOUNTER — Encounter: Payer: Self-pay | Admitting: Internal Medicine

## 2016-04-19 DIAGNOSIS — R252 Cramp and spasm: Secondary | ICD-10-CM | POA: Insufficient documentation

## 2016-04-19 DIAGNOSIS — C50812 Malignant neoplasm of overlapping sites of left female breast: Secondary | ICD-10-CM

## 2016-04-19 DIAGNOSIS — Z801 Family history of malignant neoplasm of trachea, bronchus and lung: Secondary | ICD-10-CM

## 2016-04-19 DIAGNOSIS — Z79899 Other long term (current) drug therapy: Secondary | ICD-10-CM | POA: Insufficient documentation

## 2016-04-19 DIAGNOSIS — K219 Gastro-esophageal reflux disease without esophagitis: Secondary | ICD-10-CM | POA: Insufficient documentation

## 2016-04-19 DIAGNOSIS — Z9013 Acquired absence of bilateral breasts and nipples: Secondary | ICD-10-CM

## 2016-04-19 DIAGNOSIS — I2510 Atherosclerotic heart disease of native coronary artery without angina pectoris: Secondary | ICD-10-CM | POA: Diagnosis not present

## 2016-04-19 DIAGNOSIS — M1990 Unspecified osteoarthritis, unspecified site: Secondary | ICD-10-CM

## 2016-04-19 DIAGNOSIS — Z79811 Long term (current) use of aromatase inhibitors: Secondary | ICD-10-CM | POA: Insufficient documentation

## 2016-04-19 DIAGNOSIS — F17210 Nicotine dependence, cigarettes, uncomplicated: Secondary | ICD-10-CM | POA: Insufficient documentation

## 2016-04-19 DIAGNOSIS — M542 Cervicalgia: Secondary | ICD-10-CM | POA: Diagnosis not present

## 2016-04-19 DIAGNOSIS — Z170 Estrogen receptor positive status [ER+]: Secondary | ICD-10-CM | POA: Insufficient documentation

## 2016-04-19 DIAGNOSIS — I10 Essential (primary) hypertension: Secondary | ICD-10-CM | POA: Diagnosis not present

## 2016-04-19 DIAGNOSIS — J449 Chronic obstructive pulmonary disease, unspecified: Secondary | ICD-10-CM | POA: Diagnosis not present

## 2016-04-19 DIAGNOSIS — Z7982 Long term (current) use of aspirin: Secondary | ICD-10-CM | POA: Diagnosis not present

## 2016-04-19 DIAGNOSIS — Z853 Personal history of malignant neoplasm of breast: Secondary | ICD-10-CM | POA: Insufficient documentation

## 2016-04-19 DIAGNOSIS — M7551 Bursitis of right shoulder: Secondary | ICD-10-CM

## 2016-04-19 DIAGNOSIS — E119 Type 2 diabetes mellitus without complications: Secondary | ICD-10-CM

## 2016-04-19 DIAGNOSIS — Z9011 Acquired absence of right breast and nipple: Secondary | ICD-10-CM | POA: Diagnosis not present

## 2016-04-19 DIAGNOSIS — M7552 Bursitis of left shoulder: Secondary | ICD-10-CM

## 2016-04-19 HISTORY — DX: Cervicalgia: M54.2

## 2016-04-19 LAB — CBC WITH DIFFERENTIAL/PLATELET
BASOS ABS: 0.1 10*3/uL (ref 0–0.1)
Basophils Relative: 4 %
EOS PCT: 1 %
Eosinophils Absolute: 0.1 10*3/uL (ref 0–0.7)
HCT: 28.6 % — ABNORMAL LOW (ref 35.0–47.0)
HEMOGLOBIN: 9.6 g/dL — AB (ref 12.0–16.0)
LYMPHS PCT: 42 %
Lymphs Abs: 1.6 10*3/uL (ref 1.0–3.6)
MCH: 32.7 pg (ref 26.0–34.0)
MCHC: 33.5 g/dL (ref 32.0–36.0)
MCV: 97.6 fL (ref 80.0–100.0)
Monocytes Absolute: 0.2 10*3/uL (ref 0.2–0.9)
Monocytes Relative: 5 %
NEUTROS PCT: 48 %
Neutro Abs: 1.8 10*3/uL (ref 1.4–6.5)
PLATELETS: 284 10*3/uL (ref 150–440)
RBC: 2.93 MIL/uL — AB (ref 3.80–5.20)
RDW: 15.9 % — ABNORMAL HIGH (ref 11.5–14.5)
WBC: 3.8 10*3/uL (ref 3.6–11.0)

## 2016-04-19 LAB — COMPREHENSIVE METABOLIC PANEL
ALK PHOS: 68 U/L (ref 38–126)
ALT: 9 U/L — AB (ref 14–54)
AST: 15 U/L (ref 15–41)
Albumin: 3.9 g/dL (ref 3.5–5.0)
Anion gap: 5 (ref 5–15)
BUN: 25 mg/dL — AB (ref 6–20)
CHLORIDE: 101 mmol/L (ref 101–111)
CO2: 27 mmol/L (ref 22–32)
CREATININE: 1.16 mg/dL — AB (ref 0.44–1.00)
Calcium: 9.3 mg/dL (ref 8.9–10.3)
GFR calc Af Amer: 55 mL/min — ABNORMAL LOW (ref >60)
GFR, EST NON AFRICAN AMERICAN: 47 mL/min — AB (ref >60)
Glucose, Bld: 221 mg/dL — ABNORMAL HIGH (ref 65–99)
Potassium: 4.3 mmol/L (ref 3.5–5.1)
SODIUM: 133 mmol/L — AB (ref 135–145)
Total Bilirubin: 0.5 mg/dL (ref 0.3–1.2)
Total Protein: 7.8 g/dL (ref 6.5–8.1)

## 2016-04-19 MED ORDER — OXYCODONE-ACETAMINOPHEN 5-325 MG PO TABS: 1.0000 | ORAL_TABLET | Freq: Three times a day (TID) | ORAL | 0 refills | Status: DC | PRN

## 2016-04-19 NOTE — Assessment & Plan Note (Addendum)
#  Metastatic breast cancer/lobular ER/PR positive HER-2/neu negative- on ibrance and Femara . Significant response noted. s/p palliative mastectomy on the left side. Clinically no evidence of progression.  # Restart ibrance-continue letrozole.  Labs from today reviewed; acceptable for treatment.   # neck spasm-? Etiology- ice pack; check X-ray. Prescription for Percocet given.   # Follow up in 4 weeks/labs.

## 2016-04-19 NOTE — Progress Notes (Signed)
Pt complains of constipation.  Pain in shoulders and says it is bursitis.  Complains to a spasm on the left side of neck and hurst a lot

## 2016-04-19 NOTE — Progress Notes (Signed)
[place] OFFICE PROGRESS NOTE  Patient Care Team: Donnie Coffin, MD as PCP - General (Family Medicine) Donnie Coffin, MD as Referring Physician (Family Medicine) Seeplaputhur Robinette Haines, MD (General Surgery) Forest Gleason, MD (Oncology)  Cancer of left breast [facility])   Staging form: Breast, AJCC 7th Edition     Clinical: Stage IIIB (T4, N1, cM0(i+)) - Signed by Forest Gleason, MD on 08/09/2015     Pathologic: Stage IV (T4, N1, M1) - Signed by Forest Gleason, MD on 08/16/2015    Oncology History   # DEC 2016-  LEFT BREAST CA- LOBULAR CA ER/PR-Pos; her 2 NEG STAGE IV [[id number]- left breast/bil Ax LN/Media LN/RP LN; skeletal mets]; DEC 2016- LETROZOLE + IBRANCE; April 2017- Significant response to treatment- [Dr.Sankar]; s/p Left mastec & ALND [palliative]; cont Ibrance + Letrzole  # RIGHT BREAST CA [s/p mastectomy [facility]]     Cancer of left breast ([place])   08/09/2015 Initial Diagnosis    Cancer of left breast (HCC)       Cancer of overlapping sites of left female breast [facility])      INTERVAL HISTORY:  Sophia Nelson 70 y.o.  female pleasant patient above history of Metastatic breast cancer lobular carcinoma currently on ibrance and letrozole- Status post palliative mastectomy on the left side is here for follow-up  Patient complains of neck spasm pain in the last 3 months. Patient complains of bilateral shoulder bursitis again chronic. This is her off week of ibrance.   Appetite is good. No weight loss. No nausea no vomiting. No chest pain. No fevers. No bone pain. She has been compliant with her medications.   REVIEW OF SYSTEMS:  A complete 10 point review of system is done which is negative except mentioned above/history of present illness.   PAST MEDICAL HISTORY :  Past Medical History:  Diagnosis Date  . Anemia   . Arthritis   . Breast cancer ([place]) 2009   RT MASTECTOMY, DCIS  . Cancer of left breast ([place]) 08/09/2015   T4 N1 M1 tumor, INVASIVE LOBULAR CARCINOMA.   [name] COPD (chronic obstructive pulmonary disease) ([place])   . Diabetes mellitus without complication ([place])   . GERD (gastroesophageal reflux disease)   . Headache    h/o migraines as a child  . Hypertension   . Pneumonia 2015  . Shortness of breath dyspnea    with exertion    PAST SURGICAL HISTORY :   Past Surgical History:  Procedure Laterality Date  . ABDOMINAL HYSTERECTOMY    . BREAST SURGERY Right 2015   mastectomy  . SIMPLE MASTECTOMY WITH AXILLARY SENTINEL NODE BIOPSY Left 02/29/2016   Procedure: SIMPLE MASTECTOMY;  Surgeon: Christene Lye, MD;  Location: [facility] ORS;  Service: General;  Laterality: Left;  . TUBAL LIGATION      FAMILY HISTORY :   Family History  Problem Relation Age of Onset  . Lung cancer Father   . Breast cancer Neg Hx     SOCIAL HISTORY:   Social History  Substance Use Topics  . Smoking status: Current Some Day Smoker    Packs/day: 0.25    Years: 15.00    Types: Cigarettes    Last attempt to quit: 02/25/2016  . Smokeless tobacco: Never Used     Comment: currently as of 02-21-16 pt states she is only smoking 2-3 cigarettes per day  . 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  MEDICATIONS:  Current Outpatient Prescriptions  Medication Sig Dispense Refill  . ADVAIR DISKUS 500-50 MCG/DOSE AEPB Inhale 1 puff into the lungs 2 (two) times daily.     [name] albuterol (PROVENTIL HFA;VENTOLIN HFA) 108 (90 Base) MCG/ACT inhaler Inhale 2 puffs into the lungs every 6 (six) hours as needed for wheezing or shortness of breath.    [name] aspirin 81 MG tablet Take 81 mg by mouth daily.    . calcium carbonate (OSCAL) 1500 (600 Ca) MG TABS tablet Take 1,500 mg by mouth 2 (two) times daily with a meal.    . CHANTIX 1 MG tablet Take 1 mg by mouth 2 (two) times daily.     . cyclobenzaprine (FLEXERIL) 10 MG tablet Take 1 tablet (10 mg total) by  mouth at bedtime. 30 tablet 3  . docusate sodium (COLACE) 100 MG capsule Take 100 mg by mouth 2 (two) times daily.    . fluticasone (FLONASE) 50 MCG/ACT nasal spray Place 2 sprays into both nostrils daily.    [name] GLIPIZIDE XL 2.5 MG 24 hr tablet Take 2.5 mg by mouth daily with breakfast.     . hydrochlorothiazide (MICROZIDE) 12.5 MG capsule Take 12.5 mg by mouth daily.     [name] letrozole (FEMARA) 2.5 MG tablet Take 1 tablet (2.5 mg total) by mouth daily. 90 tablet 3  . lisinopril (PRINIVIL,ZESTRIL) 40 MG tablet Take 40 mg by mouth every morning.     . montelukast (SINGULAIR) 10 MG tablet Take 10 mg by mouth at bedtime.    [name] omeprazole (PRILOSEC) 20 MG capsule Take 20 mg by mouth every morning.     . OXYGEN Inhale 2 L into the lungs continuous.     . palbociclib (IBRANCE) 125 MG capsule     . SPIRIVA HANDIHALER 18 MCG inhalation capsule Place 18 mcg into inhaler and inhale every morning.     [name] oxyCODONE-acetaminophen (ROXICET) 5-325 MG tablet Take 1 tablet by mouth every 8 (eight) hours as needed. 40 tablet 0   No current facility-administered medications for this visit.     PHYSICAL EXAMINATION: ECOG PERFORMANCE STATUS: 0 - Asymptomatic  BP 125/70 (BP Location: Right Arm, Patient Position: Sitting)   Pulse 99   Temp (!) 96.9 F (36.1 C) (Tympanic)   Resp 20   Ht 5' 2"  (1.575 m)   Wt 170 lb 3.2 oz (77.2 kg)   SpO2 98%   BMI 31.13 kg/m   Filed Weights   04/19/16 1531  Weight: 170 lb 3.2 oz (77.2 kg)    GENERAL: Well-nourished well-developed; Alert, no distress and comfortable.   Alone.  EYES: no pallor or icterus OROPHARYNX: no thrush or ulceration;poor dentition.   NECK: supple, no masses felt LYMPH:  no palpable lymphadenopathy in the cervical, axillary or inguinal regions LUNGS: clear to auscultation and  No wheeze or crackles HEART/CVS: regular rate & rhythm and no murmurs; No lower extremity edema ABDOMEN:abdomen soft, non-tender and normal bowel sounds Musculoskeletal:no  cyanosis of digits and no clubbing  PSYCH: alert & oriented x 3 with fluent speech NEURO: no focal motor/sensory deficits SKIN:  no rashes or significant lesions   LABORATORY DATA:  I have reviewed the data as listed    Component Value Date/Time   NA 133 (L) 04/19/2016 1452   NA 133 (L) 04/08/2014 1738   K 4.3 04/19/2016 1452   K 4.2 04/08/2014 1738  CL 101 04/19/2016 1452   CL 99 04/08/2014 1738   CO2 27 04/19/2016 1452   CO2 28 04/08/2014 1738   GLUCOSE 221 (H) 04/19/2016 1452   GLUCOSE 116 (H) 04/08/2014 1738   BUN 25 (H) 04/19/2016 1452   BUN 5 (L) 04/08/2014 1738   CREATININE 1.16 (H) 04/19/2016 1452   CREATININE 0.42 (L) 04/08/2014 1738   CALCIUM 9.3 04/19/2016 1452   CALCIUM 9.5 04/08/2014 1738   PROT 7.8 04/19/2016 1452   ALBUMIN 3.9 04/19/2016 1452   AST 15 04/19/2016 1452   ALT 9 (L) 04/19/2016 1452   ALKPHOS 68 04/19/2016 1452   BILITOT 0.5 04/19/2016 1452   GFRNONAA 47 (L) 04/19/2016 1452   GFRNONAA >60 04/08/2014 1738   GFRAA 55 (L) 04/19/2016 1452   GFRAA >60 04/08/2014 1738    No results found for: SPEP, UPEP  Lab Results  Component Value Date   WBC 3.8 04/19/2016   NEUTROABS 1.8 04/19/2016   HGB 9.6 (L) 04/19/2016   HCT 28.6 (L) 04/19/2016   MCV 97.6 04/19/2016   PLT 284 04/19/2016      Chemistry      Component Value Date/Time   NA 133 (L) 04/19/2016 1452   NA 133 (L) 04/08/2014 1738   K 4.3 04/19/2016 1452   K 4.2 04/08/2014 1738   CL 101 04/19/2016 1452   CL 99 04/08/2014 1738   CO2 27 04/19/2016 1452   CO2 28 04/08/2014 1738   BUN 25 (H) 04/19/2016 1452   BUN 5 (L) 04/08/2014 1738   CREATININE 1.16 (H) 04/19/2016 1452   CREATININE 0.42 (L) 04/08/2014 1738      Component Value Date/Time   CALCIUM 9.3 04/19/2016 1452   CALCIUM 9.5 04/08/2014 1738   ALKPHOS 68 04/19/2016 1452   AST 15 04/19/2016 1452   ALT 9 (L) 04/19/2016 1452   BILITOT 0.5 04/19/2016 1452     IMPRESSION: 1. Today's study demonstrates a positive response to  therapy. Specifically, there has been complete resolution of the hypermetabolic skin thickening in the left breast seen on the prior study, as well as resolution of left axillary, left supraclavicular, mediastinal and bilateral hilar lymphadenopathy. 2. Additionally, previously noted enlarged hypermetabolic retroperitoneal lymph nodes have resolved. Although there continues to be some low-level metabolic activity within the adrenal glands bilaterally, there is no definite nodule to suggest metastatic disease to the adrenal glands. 3. Previously suspect is osseous lesions are no longer apparent on either PET or CT imaging. 4. Previously noted soft tissue prominence and hypermetabolism in the right ovary has resolved. 5. Asymmetric hypermetabolic activity in the region of the left pharyngeal tonsil, favored be physiologic. This should be amenable to direct inspection. 6. Although there continues to be some very mild bilateral hydronephrosis and perinephric stranding, this has improved compared to the prior study. No associated hydroureter, and no urinary tract calculi. 7. Atherosclerosis, including three-vessel coronary artery disease. Please note that although the presence of coronary artery calcium documents the presence of coronary artery disease, the severity of this disease and any potential stenosis cannot be assessed on this non-gated CT examination. Assessment for potential risk factor modification, dietary therapy or pharmacologic therapy may be warranted, if clinically indicated. 8. Additional incidental findings, as above.   Electronically Signed  By: Vinnie Langton M.D.  On: 12/20/2015 14:08  RADIOGRAPHIC STUDIES: I have personally reviewed the radiological images as listed and agreed with the findings in the report. No results found.   ASSESSMENT & PLAN:  Cancer  of overlapping sites of left female breast [facility]) # Metastatic breast cancer/lobular ER/PR positive  HER-2/neu negative- on ibrance and Femara . Significant response noted. s/p palliative mastectomy on the left side. Clinically no evidence of progression.  # Restart ibrance-continue letrozole.  Labs from today reviewed; acceptable for treatment.   # neck spasm-? Etiology- ice pack; check X-ray. Prescription for Percocet given.   # Follow up in 4 weeks/labs.     Orders Placed This Encounter  Procedures  . DG Cervical Spine 2 or 3 views    Standing Status:   Future    Standing Expiration Date:   06/19/2017    Order Specific Question:   Reason for Exam (SYMPTOM  OR DIAGNOSIS REQUIRED)    Answer:   neck pain    Order Specific Question:   Preferred imaging location?    Answer:   [facility]  . CBC with Differential    Standing Status:   Future    Standing Expiration Date:   04/19/2017  . Comprehensive metabolic panel    Standing Status:   Future    Standing Expiration Date:   04/19/2017  . Cancer antigen 27.29    Standing Status:   Future    Standing Expiration Date:   04/19/2017  . Lactate dehydrogenase    Standing Status:   Future    Standing Expiration Date:   04/19/2017   All questions were answered. The patient knows to call the clinic with any problems, questions or concerns.      [name], MD 04/19/2016 4:35 PM

## 2016-04-20 LAB — CANCER ANTIGEN 27.29: CA 27.29: 23.3 U/mL (ref 0.0–38.6)

## 2016-05-08 ENCOUNTER — Telehealth: Payer: Self-pay | Admitting: *Deleted

## 2016-05-08 NOTE — Telephone Encounter (Signed)
Led with questions regarding whether we have done any A1C checks on patient, He is seeing retinal hemorhaging, asking if we had done any thyroid imaging also. He stated he will contact her PCP.

## 2016-05-21 ENCOUNTER — Inpatient Hospital Stay: Payer: Medicare HMO

## 2016-05-21 ENCOUNTER — Other Ambulatory Visit: Payer: Self-pay

## 2016-05-21 ENCOUNTER — Inpatient Hospital Stay: Payer: Medicare HMO | Attending: Internal Medicine | Admitting: Internal Medicine

## 2016-05-21 VITALS — BP 121/77 | HR 94 | Temp 97.0°F | Resp 20 | Ht 62.0 in | Wt 171.2 lb

## 2016-05-21 DIAGNOSIS — Z801 Family history of malignant neoplasm of trachea, bronchus and lung: Secondary | ICD-10-CM | POA: Diagnosis not present

## 2016-05-21 DIAGNOSIS — M542 Cervicalgia: Secondary | ICD-10-CM

## 2016-05-21 DIAGNOSIS — Z79899 Other long term (current) drug therapy: Secondary | ICD-10-CM | POA: Insufficient documentation

## 2016-05-21 DIAGNOSIS — M7551 Bursitis of right shoulder: Secondary | ICD-10-CM | POA: Insufficient documentation

## 2016-05-21 DIAGNOSIS — I10 Essential (primary) hypertension: Secondary | ICD-10-CM | POA: Diagnosis not present

## 2016-05-21 DIAGNOSIS — C50812 Malignant neoplasm of overlapping sites of left female breast: Secondary | ICD-10-CM

## 2016-05-21 DIAGNOSIS — F17210 Nicotine dependence, cigarettes, uncomplicated: Secondary | ICD-10-CM | POA: Diagnosis not present

## 2016-05-21 DIAGNOSIS — Z9011 Acquired absence of right breast and nipple: Secondary | ICD-10-CM | POA: Diagnosis not present

## 2016-05-21 DIAGNOSIS — Z170 Estrogen receptor positive status [ER+]: Secondary | ICD-10-CM | POA: Diagnosis not present

## 2016-05-21 DIAGNOSIS — J449 Chronic obstructive pulmonary disease, unspecified: Secondary | ICD-10-CM | POA: Insufficient documentation

## 2016-05-21 DIAGNOSIS — C50912 Malignant neoplasm of unspecified site of left female breast: Secondary | ICD-10-CM

## 2016-05-21 DIAGNOSIS — Z79811 Long term (current) use of aromatase inhibitors: Secondary | ICD-10-CM | POA: Insufficient documentation

## 2016-05-21 DIAGNOSIS — E119 Type 2 diabetes mellitus without complications: Secondary | ICD-10-CM | POA: Insufficient documentation

## 2016-05-21 DIAGNOSIS — R252 Cramp and spasm: Secondary | ICD-10-CM | POA: Insufficient documentation

## 2016-05-21 DIAGNOSIS — M7552 Bursitis of left shoulder: Secondary | ICD-10-CM | POA: Insufficient documentation

## 2016-05-21 DIAGNOSIS — Z9013 Acquired absence of bilateral breasts and nipples: Secondary | ICD-10-CM

## 2016-05-21 DIAGNOSIS — K219 Gastro-esophageal reflux disease without esophagitis: Secondary | ICD-10-CM

## 2016-05-21 DIAGNOSIS — Z7982 Long term (current) use of aspirin: Secondary | ICD-10-CM | POA: Diagnosis not present

## 2016-05-21 DIAGNOSIS — Z853 Personal history of malignant neoplasm of breast: Secondary | ICD-10-CM | POA: Diagnosis not present

## 2016-05-21 LAB — COMPREHENSIVE METABOLIC PANEL
ALBUMIN: 4.4 g/dL (ref 3.5–5.0)
ALK PHOS: 64 U/L (ref 38–126)
ALT: 10 U/L — AB (ref 14–54)
ANION GAP: 9 (ref 5–15)
AST: 17 U/L (ref 15–41)
BUN: 20 mg/dL (ref 6–20)
CALCIUM: 9.8 mg/dL (ref 8.9–10.3)
CHLORIDE: 100 mmol/L — AB (ref 101–111)
CO2: 25 mmol/L (ref 22–32)
Creatinine, Ser: 0.99 mg/dL (ref 0.44–1.00)
GFR calc Af Amer: >60 mL/min (ref >60)
GFR calc non Af Amer: 57 mL/min — ABNORMAL LOW (ref >60)
GLUCOSE: 117 mg/dL — AB (ref 65–99)
Potassium: 4.4 mmol/L (ref 3.5–5.1)
SODIUM: 134 mmol/L — AB (ref 135–145)
Total Bilirubin: 0.5 mg/dL (ref 0.3–1.2)
Total Protein: 7.9 g/dL (ref 6.5–8.1)

## 2016-05-21 LAB — CBC WITH DIFFERENTIAL/PLATELET
BASOS PCT: 0 %
Basophils Absolute: 0 10*3/uL (ref 0–0.1)
EOS ABS: 0.1 10*3/uL (ref 0–0.7)
EOS PCT: 1 %
HCT: 27.6 % — ABNORMAL LOW (ref 35.0–47.0)
HEMOGLOBIN: 9.3 g/dL — AB (ref 12.0–16.0)
Lymphocytes Relative: 38 %
Lymphs Abs: 1.9 10*3/uL (ref 1.0–3.6)
MCH: 32.6 pg (ref 26.0–34.0)
MCHC: 33.7 g/dL (ref 32.0–36.0)
MCV: 96.9 fL (ref 80.0–100.0)
Monocytes Absolute: 0.4 10*3/uL (ref 0.2–0.9)
Monocytes Relative: 8 %
NEUTROS PCT: 53 %
Neutro Abs: 2.6 10*3/uL (ref 1.4–6.5)
PLATELETS: 252 10*3/uL (ref 150–440)
RBC: 2.85 MIL/uL — AB (ref 3.80–5.20)
RDW: 15.1 % — ABNORMAL HIGH (ref 11.5–14.5)
WBC: 5 10*3/uL (ref 3.6–11.0)

## 2016-05-21 LAB — LACTATE DEHYDROGENASE: LDH: 144 U/L (ref 98–192)

## 2016-05-21 NOTE — Progress Notes (Signed)
Scheduled to start back on Ibrance on Wednesday this week 05/23/16

## 2016-05-21 NOTE — Progress Notes (Signed)
[place] OFFICE PROGRESS NOTE  Patient Care Team: Donnie Coffin, MD as PCP - General (Family Medicine) Donnie Coffin, MD as Referring Physician (Family Medicine) Seeplaputhur Robinette Haines, MD (General Surgery) Forest Gleason, MD (Oncology)  Cancer of left breast [facility])   Staging form: Breast, AJCC 7th Edition     Clinical: Stage IIIB (T4, N1, cM0(i+)) - Signed by Forest Gleason, MD on 08/09/2015     Pathologic: Stage IV (T4, N1, M1) - Signed by Forest Gleason, MD on 08/16/2015    Oncology History   # DEC 2016-  LEFT BREAST CA- LOBULAR CA ER/PR-Pos; her 2 NEG STAGE IV [[id number]- left breast/bil Ax LN/Media LN/RP LN; skeletal mets]; DEC 2016- LETROZOLE + IBRANCE; April 2017- Significant response to treatment- [Dr.Sankar]; s/p Left mastec & ALND [palliative]; cont Ibrance + Letrzole  # RIGHT BREAST CA [s/p mastectomy [facility]]     Cancer of left breast ([place])   08/09/2015 Initial Diagnosis    Cancer of left breast (HCC)       Cancer of overlapping sites of left female breast [facility])      INTERVAL HISTORY:  Sophia Nelson 70 y.o.  female pleasant patient above history of Metastatic breast cancer lobular carcinoma currently on ibrance and letrozole- Status post palliative mastectomy on the left side is here for follow-up.   She continues to complain of significant neck pain. Not improving.  Patient complains of bilateral shoulder bursitis again chronic. This is her off week of ibrance. She supposed to start ibrance in 2 days from now. She has not gotten her neck x-ray as recommended at last visit   Appetite is good. No weight loss. No nausea no vomiting. No chest pain. No fevers. No bone pain. She has been compliant with her medications.   REVIEW OF SYSTEMS:  A complete 10 point review of system is done which is negative except mentioned above/history of present illness.   PAST MEDICAL HISTORY :  Past Medical History:  Diagnosis Date  . Anemia   . Arthritis   . Breast cancer  ([place]) 2009   RT MASTECTOMY, DCIS  . Cancer of left breast ([place]) 08/09/2015   T4 N1 M1 tumor, INVASIVE LOBULAR CARCINOMA.  [name] COPD (chronic obstructive pulmonary disease) ([place])   . Diabetes mellitus without complication ([place])   . GERD (gastroesophageal reflux disease)   . Headache    h/o migraines as a child  . Hypertension   . Pneumonia 2015  . Shortness of breath dyspnea    with exertion    PAST SURGICAL HISTORY :   Past Surgical History:  Procedure Laterality Date  . ABDOMINAL HYSTERECTOMY    . BREAST SURGERY Right 2015   mastectomy  . SIMPLE MASTECTOMY WITH AXILLARY SENTINEL NODE BIOPSY Left 02/29/2016   Procedure: SIMPLE MASTECTOMY;  Surgeon: Christene Lye, MD;  Location: [facility] ORS;  Service: General;  Laterality: Left;  . TUBAL LIGATION      FAMILY HISTORY :   Family History  Problem Relation Age of Onset  . Lung cancer Father   . Breast cancer Neg Hx     SOCIAL HISTORY:   Social History  Substance Use Topics  . Smoking status: Current Some Day Smoker    Packs/day: 0.25    Years: 15.00    Types: Cigarettes    Last attempt to quit: 02/25/2016  . Smokeless tobacco: Never Used     Comment: currently as of 02-21-16 pt states she is only smoking 2-3 cigarettes  per day  . 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  MEDICATIONS:  Current Outpatient Prescriptions  Medication Sig Dispense Refill  . ADVAIR DISKUS 500-50 MCG/DOSE AEPB Inhale 1 puff into the lungs 2 (two) times daily.     [name] albuterol (PROVENTIL HFA;VENTOLIN HFA) 108 (90 Base) MCG/ACT inhaler Inhale 2 puffs into the lungs every 6 (six) hours as needed for wheezing or shortness of breath.    [name] aspirin 81 MG tablet Take 81 mg by mouth daily.    . calcium carbonate (OSCAL) 1500 (600 Ca) MG TABS tablet Take 1,500 mg by mouth 2 (two) times daily with a meal.    . fluticasone  (FLONASE) 50 MCG/ACT nasal spray Place 2 sprays into both nostrils daily.    [name] GLIPIZIDE XL 2.5 MG 24 hr tablet Take 2.5 mg by mouth daily with breakfast.     . hydrochlorothiazide (MICROZIDE) 12.5 MG capsule Take 12.5 mg by mouth daily.     [name] letrozole (FEMARA) 2.5 MG tablet Take 1 tablet (2.5 mg total) by mouth daily. 90 tablet 3  . lisinopril (PRINIVIL,ZESTRIL) 40 MG tablet Take 40 mg by mouth every morning.     . montelukast (SINGULAIR) 10 MG tablet Take 10 mg by mouth at bedtime.    [name] omeprazole (PRILOSEC) 20 MG capsule Take 20 mg by mouth every morning.     [name] oxyCODONE-acetaminophen (ROXICET) 5-325 MG tablet Take 1 tablet by mouth every 8 (eight) hours as needed. 40 tablet 0  . OXYGEN Inhale 2 L into the lungs continuous.     [name] SPIRIVA HANDIHALER 18 MCG inhalation capsule Place 18 mcg into inhaler and inhale every morning.     . docusate sodium (COLACE) 100 MG capsule Take 100 mg by mouth 2 (two) times daily.    . palbociclib (IBRANCE) 125 MG capsule      No current facility-administered medications for this visit.     PHYSICAL EXAMINATION: ECOG PERFORMANCE STATUS: 0 - Asymptomatic  BP 121/77 (Patient Position: Sitting)   Pulse 94   Temp 97 F (36.1 C) (Tympanic)   Resp 20   Ht _0  (1.575 m)   Wt 171 lb 3.2 oz (77.7 kg)   BMI 31.31 kg/m   Filed Weights   05/21/16 1539  Weight: 171 lb 3.2 oz (77.7 kg)    GENERAL: Well-nourished well-developed; Alert, no distress and comfortable.   Alone.  EYES: no pallor or icterus OROPHARYNX: no thrush or ulceration;poor dentition.   NECK: supple, no masses felt LYMPH:  no palpable lymphadenopathy in the cervical, axillary or inguinal regions LUNGS: clear to auscultation and  No wheeze or crackles HEART/CVS: regular rate & rhythm and no murmurs; No lower extremity edema ABDOMEN:abdomen soft, non-tender and normal bowel sounds Musculoskeletal:no cyanosis of digits and no clubbing  PSYCH: alert & oriented x 3 with fluent  speech NEURO: no focal motor/sensory deficits SKIN:  no rashes or significant lesions   LABORATORY DATA:  I have reviewed the data as listed    Component Value Date/Time   NA 134 (L) 05/21/2016 1510   NA 133 (L) 04/08/2014 1738   K 4.4 05/21/2016 1510   K 4.2 04/08/2014 1738   CL 100 (L) 05/21/2016 1510   CL 99 04/08/2014 1738   CO2 25 05/21/2016 1510   CO2 28 04/08/2014 1738  GLUCOSE 117 (H) 05/21/2016 1510   GLUCOSE 116 (H) 04/08/2014 1738   BUN 20 05/21/2016 1510   BUN 5 (L) 04/08/2014 1738   CREATININE 0.99 05/21/2016 1510   CREATININE 0.42 (L) 04/08/2014 1738   CALCIUM 9.8 05/21/2016 1510   CALCIUM 9.5 04/08/2014 1738   PROT 7.9 05/21/2016 1510   ALBUMIN 4.4 05/21/2016 1510   AST 17 05/21/2016 1510   ALT 10 (L) 05/21/2016 1510   ALKPHOS 64 05/21/2016 1510   BILITOT 0.5 05/21/2016 1510   GFRNONAA 57 (L) 05/21/2016 1510   GFRNONAA >60 04/08/2014 1738   GFRAA >60 05/21/2016 1510   GFRAA >60 04/08/2014 1738    No results found for: SPEP, UPEP  Lab Results  Component Value Date   WBC 5.0 05/21/2016   NEUTROABS 2.6 05/21/2016   HGB 9.3 (L) 05/21/2016   HCT 27.6 (L) 05/21/2016   MCV 96.9 05/21/2016   PLT 252 05/21/2016      Chemistry      Component Value Date/Time   NA 134 (L) 05/21/2016 1510   NA 133 (L) 04/08/2014 1738   K 4.4 05/21/2016 1510   K 4.2 04/08/2014 1738   CL 100 (L) 05/21/2016 1510   CL 99 04/08/2014 1738   CO2 25 05/21/2016 1510   CO2 28 04/08/2014 1738   BUN 20 05/21/2016 1510   BUN 5 (L) 04/08/2014 1738   CREATININE 0.99 05/21/2016 1510   CREATININE 0.42 (L) 04/08/2014 1738      Component Value Date/Time   CALCIUM 9.8 05/21/2016 1510   CALCIUM 9.5 04/08/2014 1738   ALKPHOS 64 05/21/2016 1510   AST 17 05/21/2016 1510   ALT 10 (L) 05/21/2016 1510   BILITOT 0.5 05/21/2016 1510       RADIOGRAPHIC STUDIES: I have personally reviewed the radiological images as listed and agreed with the findings in the report. No results found.    ASSESSMENT & PLAN:  Cancer of overlapping sites of left female breast ([place]) # Metastatic breast cancer/lobular ER/PR positive HER-2/neu negative- on ibrance and Femara . Significant response noted. s/p palliative mastectomy on the left side. Clinically no evidence of progression. Last CT scan was in end of May 2017 ; especially with the worsening neck pain -Check CT scan neck/chest/ap.   # Restart ibrance on 9/27th; -continue letrozole.  Labs from today reviewed; acceptable for treatment.   # neck spasm-not improved; on percocet;  check X-ray- not done at last visit; await CT scan neck. Scans ASAP.   # Follow up in 4 weeks/labs.    Orders Placed This Encounter  Procedures  . CT ABDOMEN PELVIS W CONTRAST    Standing Status:   Future    Standing Expiration Date:   08/20/2017    Order Specific Question:   Reason for Exam (SYMPTOM  OR DIAGNOSIS REQUIRED)    Answer:   breast cancer    Order Specific Question:   Preferred imaging location?    Answer:    [facility]  . CT CHEST W CONTRAST    Standing Status:   Future    Standing Expiration Date:   07/21/2017    Order Specific Question:   Reason for Exam (SYMPTOM  OR DIAGNOSIS REQUIRED)    Answer:   breast cancer    Order Specific Question:   Preferred imaging location?    Answer:   [facility]  . CT SOFT TISSUE NECK W CONTRAST    Standing Status:   Future    Standing Expiration Date:  08/20/2017    Order Specific Question:   Reason for Exam (SYMPTOM  OR DIAGNOSIS REQUIRED)    Answer:   breast cancer    Order Specific Question:   Preferred imaging location?    Answer:   [facility]   All questions were answered. The patient knows to call the clinic with any problems, questions or concerns.      [name], MD 05/21/2016 4:23 PM

## 2016-05-21 NOTE — Assessment & Plan Note (Addendum)
#  Metastatic breast cancer/lobular ER/PR positive HER-2/neu negative- on ibrance and Femara . Significant response noted. s/p palliative mastectomy on the left side. Clinically no evidence of progression. Last CT scan was in end of May 2017 ; especially with the worsening neck pain -Check CT scan neck/chest/ap.   # Restart ibrance on 9/27th; -continue letrozole.  Labs from today reviewed; acceptable for treatment.   # neck spasm-not improved; on percocet;  check X-ray- not done at last visit; await CT scan neck. Scans ASAP.   # Follow up in 4 weeks/labs.

## 2016-05-22 LAB — CANCER ANTIGEN 27.29: CA 27.29: 22.2 U/mL (ref 0.0–38.6)

## 2016-05-28 ENCOUNTER — Ambulatory Visit: Payer: Medicare HMO

## 2016-05-31 ENCOUNTER — Ambulatory Visit
Admission: RE | Admit: 2016-05-31 | Discharge: 2016-05-31 | Disposition: A | Discharge status: Home / Self Care | Payer: Medicare HMO | Source: Ambulatory Visit | Attending: Internal Medicine | Admitting: Internal Medicine

## 2016-05-31 DIAGNOSIS — C50812 Malignant neoplasm of overlapping sites of left female breast: Secondary | ICD-10-CM

## 2016-05-31 DIAGNOSIS — I6523 Occlusion and stenosis of bilateral carotid arteries: Secondary | ICD-10-CM | POA: Diagnosis not present

## 2016-05-31 DIAGNOSIS — J439 Emphysema, unspecified: Secondary | ICD-10-CM | POA: Diagnosis not present

## 2016-05-31 DIAGNOSIS — M2578 Osteophyte, vertebrae: Secondary | ICD-10-CM | POA: Insufficient documentation

## 2016-05-31 DIAGNOSIS — I700 Atherosclerosis of aorta: Secondary | ICD-10-CM | POA: Diagnosis not present

## 2016-05-31 DIAGNOSIS — M542 Cervicalgia: Secondary | ICD-10-CM | POA: Diagnosis present

## 2016-05-31 DIAGNOSIS — Z08 Encounter for follow-up examination after completed treatment for malignant neoplasm: Secondary | ICD-10-CM | POA: Insufficient documentation

## 2016-05-31 HISTORY — DX: Unspecified asthma, uncomplicated: J45.909

## 2016-05-31 MED ORDER — IOPAMIDOL (ISOVUE-300) INJECTION 61%
100.0000 mL | Freq: Once | INTRAVENOUS | Status: AC | PRN
  Administered 2016-05-31: 100 mL via INTRAVENOUS

## 2016-06-01 ENCOUNTER — Telehealth: Payer: Self-pay | Admitting: *Deleted

## 2016-06-01 NOTE — Telephone Encounter (Signed)
Left voice msg for patient. Scans did not demonstrate any cause for neck pain. She needs to f/u her primary care provider or orthopedic doctor.

## 2016-06-01 NOTE — Telephone Encounter (Signed)
-----   Message from [name], MD sent at 05/31/2016  5:06 PM EDT ----- Please inform pt that scans do not show cause of her neck pain- likley arthritis in shoudlers. Folllow up with orthopedics/PCP.

## 2016-06-18 ENCOUNTER — Other Ambulatory Visit: Payer: Self-pay

## 2016-06-18 ENCOUNTER — Inpatient Hospital Stay (HOSPITAL_BASED_OUTPATIENT_CLINIC_OR_DEPARTMENT_OTHER): Payer: Medicare HMO | Admitting: Internal Medicine

## 2016-06-18 ENCOUNTER — Inpatient Hospital Stay: Payer: Medicare HMO | Attending: Internal Medicine

## 2016-06-18 VITALS — BP 108/76 | HR 94 | Temp 97.5°F | Resp 18 | Wt 170.0 lb

## 2016-06-18 DIAGNOSIS — M7551 Bursitis of right shoulder: Secondary | ICD-10-CM | POA: Diagnosis not present

## 2016-06-18 DIAGNOSIS — Z9011 Acquired absence of right breast and nipple: Secondary | ICD-10-CM

## 2016-06-18 DIAGNOSIS — C50812 Malignant neoplasm of overlapping sites of left female breast: Secondary | ICD-10-CM | POA: Diagnosis present

## 2016-06-18 DIAGNOSIS — E119 Type 2 diabetes mellitus without complications: Secondary | ICD-10-CM

## 2016-06-18 DIAGNOSIS — K219 Gastro-esophageal reflux disease without esophagitis: Secondary | ICD-10-CM

## 2016-06-18 DIAGNOSIS — D649 Anemia, unspecified: Secondary | ICD-10-CM | POA: Diagnosis not present

## 2016-06-18 DIAGNOSIS — Z853 Personal history of malignant neoplasm of breast: Secondary | ICD-10-CM | POA: Insufficient documentation

## 2016-06-18 DIAGNOSIS — Z9981 Dependence on supplemental oxygen: Secondary | ICD-10-CM | POA: Insufficient documentation

## 2016-06-18 DIAGNOSIS — J449 Chronic obstructive pulmonary disease, unspecified: Secondary | ICD-10-CM | POA: Insufficient documentation

## 2016-06-18 DIAGNOSIS — Z170 Estrogen receptor positive status [ER+]: Secondary | ICD-10-CM | POA: Diagnosis not present

## 2016-06-18 DIAGNOSIS — M62838 Other muscle spasm: Secondary | ICD-10-CM | POA: Insufficient documentation

## 2016-06-18 DIAGNOSIS — M1990 Unspecified osteoarthritis, unspecified site: Secondary | ICD-10-CM | POA: Diagnosis not present

## 2016-06-18 DIAGNOSIS — C50912 Malignant neoplasm of unspecified site of left female breast: Secondary | ICD-10-CM

## 2016-06-18 DIAGNOSIS — Z7982 Long term (current) use of aspirin: Secondary | ICD-10-CM

## 2016-06-18 DIAGNOSIS — Z9013 Acquired absence of bilateral breasts and nipples: Secondary | ICD-10-CM | POA: Diagnosis not present

## 2016-06-18 DIAGNOSIS — M542 Cervicalgia: Secondary | ICD-10-CM

## 2016-06-18 DIAGNOSIS — F17210 Nicotine dependence, cigarettes, uncomplicated: Secondary | ICD-10-CM | POA: Insufficient documentation

## 2016-06-18 DIAGNOSIS — Z79811 Long term (current) use of aromatase inhibitors: Secondary | ICD-10-CM | POA: Diagnosis not present

## 2016-06-18 DIAGNOSIS — I10 Essential (primary) hypertension: Secondary | ICD-10-CM

## 2016-06-18 DIAGNOSIS — Z801 Family history of malignant neoplasm of trachea, bronchus and lung: Secondary | ICD-10-CM | POA: Insufficient documentation

## 2016-06-18 DIAGNOSIS — Z79899 Other long term (current) drug therapy: Secondary | ICD-10-CM

## 2016-06-18 LAB — COMPREHENSIVE METABOLIC PANEL
ALT: 8 U/L — ABNORMAL LOW (ref 14–54)
ANION GAP: 11 (ref 5–15)
AST: 15 U/L (ref 15–41)
Albumin: 4 g/dL (ref 3.5–5.0)
Alkaline Phosphatase: 61 U/L (ref 38–126)
BILIRUBIN TOTAL: 0.5 mg/dL (ref 0.3–1.2)
BUN: 25 mg/dL — AB (ref 6–20)
CO2: 24 mmol/L (ref 22–32)
Calcium: 9.5 mg/dL (ref 8.9–10.3)
Chloride: 104 mmol/L (ref 101–111)
Creatinine, Ser: 1.47 mg/dL — ABNORMAL HIGH (ref 0.44–1.00)
GFR, EST AFRICAN AMERICAN: 41 mL/min — AB (ref >60)
GFR, EST NON AFRICAN AMERICAN: 35 mL/min — AB (ref >60)
Glucose, Bld: 82 mg/dL (ref 65–99)
POTASSIUM: 4.5 mmol/L (ref 3.5–5.1)
Sodium: 139 mmol/L (ref 135–145)
TOTAL PROTEIN: 7.6 g/dL (ref 6.5–8.1)

## 2016-06-18 LAB — CBC WITH DIFFERENTIAL/PLATELET
Basophils Absolute: 0.1 10*3/uL (ref 0–0.1)
Basophils Relative: 2 %
Eosinophils Absolute: 0.1 10*3/uL (ref 0–0.7)
Eosinophils Relative: 1 %
HEMATOCRIT: 25.8 % — AB (ref 35.0–47.0)
Hemoglobin: 8.6 g/dL — ABNORMAL LOW (ref 12.0–16.0)
LYMPHS PCT: 40 %
Lymphs Abs: 1.6 10*3/uL (ref 1.0–3.6)
MCH: 32.8 pg (ref 26.0–34.0)
MCHC: 33.6 g/dL (ref 32.0–36.0)
MCV: 97.6 fL (ref 80.0–100.0)
MONO ABS: 0.3 10*3/uL (ref 0.2–0.9)
MONOS PCT: 8 %
NEUTROS ABS: 2 10*3/uL (ref 1.4–6.5)
Neutrophils Relative %: 49 %
Platelets: 242 10*3/uL (ref 150–440)
RBC: 2.64 MIL/uL — ABNORMAL LOW (ref 3.80–5.20)
RDW: 15.6 % — AB (ref 11.5–14.5)
WBC: 4.1 10*3/uL (ref 3.6–11.0)

## 2016-06-18 MED ORDER — OXYCODONE-ACETAMINOPHEN 5-325 MG PO TABS: 1.0000 | ORAL_TABLET | Freq: Every day | ORAL | 0 refills | Status: DC

## 2016-06-18 NOTE — Progress Notes (Signed)
Patient is here for follow up, she was recently placed on Tizanidine and it does not seem to help with her pain

## 2016-06-18 NOTE — Progress Notes (Signed)
[place] OFFICE PROGRESS NOTE  Patient Care Team: Donnie Coffin, MD as PCP - General (Family Medicine) Donnie Coffin, MD as Referring Physician (Family Medicine) Seeplaputhur Robinette Haines, MD (General Surgery) Forest Gleason, MD (Oncology)  Cancer of left breast [facility])   Staging form: Breast, AJCC 7th Edition     Clinical: Stage IIIB (T4, N1, cM0(i+)) - Signed by Forest Gleason, MD on 08/09/2015     Pathologic: Stage IV (T4, N1, M1) - Signed by Forest Gleason, MD on 08/16/2015    Oncology History   # DEC 2016-  LEFT BREAST CA- LOBULAR CA ER/PR-Pos; her 2 NEG STAGE IV [[id number]- left breast/bil Ax LN/Media LN/RP LN; skeletal mets]; DEC 2016- LETROZOLE + IBRANCE; April 2017- Significant response to treatment- [Dr.Sankar]; s/p Left mastec & ALND [palliative]; cont Ibrance + Letrzole  # RIGHT BREAST CA [s/p mastectomy [facility]]     Cancer of left breast ([place])   08/09/2015 Initial Diagnosis    Cancer of left breast (HCC)       Cancer of overlapping sites of left female breast [facility])      INTERVAL HISTORY:  Sophia Nelson 70 y.o.  female pleasant patient above history of Metastatic breast cancer lobular carcinoma currently on ibrance and letrozole- Status post palliative mastectomy on the left side is here for follow-upAnd discussion of her imaging results.   She continues to complain of significant neck pain which despite multiple treatments, is unchanged.  Patient complains of bilateral shoulder bursitis again chronic. She is tolerating ibrance well without significant side effects.   She continues to have a good appetite and denies weight loss. She denies any nausea or vomiting. She has no chest pain or shortness of breath. She denies any recent fevers or illnesses. Patient otherwise feels well and offers no further specific complaints.    REVIEW OF SYSTEMS:  A complete 10 point review of system is done which is negative except mentioned above/history of present illness.   PAST  MEDICAL HISTORY :  Past Medical History:  Diagnosis Date  . Anemia   . Arthritis   . Asthma   . Breast cancer ([place]) 2009   RT MASTECTOMY, DCIS  . Cancer of left breast ([place]) 08/09/2015   T4 N1 M1 tumor, INVASIVE LOBULAR CARCINOMA.  [name] COPD (chronic obstructive pulmonary disease) ([place])   . Diabetes mellitus without complication ([place])   . GERD (gastroesophageal reflux disease)   . Headache    h/o migraines as a child  . Hypertension   . Pneumonia 2015  . Shortness of breath dyspnea    with exertion    PAST SURGICAL HISTORY :   Past Surgical History:  Procedure Laterality Date  . ABDOMINAL HYSTERECTOMY    . BREAST SURGERY Right 2015   mastectomy  . SIMPLE MASTECTOMY WITH AXILLARY SENTINEL NODE BIOPSY Left 02/29/2016   Procedure: SIMPLE MASTECTOMY;  Surgeon: Christene Lye, MD;  Location: [facility] ORS;  Service: General;  Laterality: Left;  . TUBAL LIGATION      FAMILY HISTORY :   Family History  Problem Relation Age of Onset  . Lung cancer Father   . Breast cancer Neg Hx     SOCIAL HISTORY:   Social History  Substance Use Topics  . Smoking status: Current Some Day Smoker    Packs/day: 0.25    Years: 15.00    Types: Cigarettes    Last attempt to quit: 02/25/2016  . Smokeless tobacco: Never Used     Comment:  currently as of 02-21-16 pt states she is only smoking 2-3 cigarettes per day  . 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  MEDICATIONS:  Current Outpatient Prescriptions  Medication Sig Dispense Refill  . ADVAIR DISKUS 500-50 MCG/DOSE AEPB Inhale 1 puff into the lungs 2 (two) times daily.     [name] albuterol (PROVENTIL HFA;VENTOLIN HFA) 108 (90 Base) MCG/ACT inhaler Inhale 2 puffs into the lungs every 6 (six) hours as needed for wheezing or shortness of breath.    [name] aspirin 81 MG tablet Take 81 mg by mouth daily.    . calcium carbonate  (OSCAL) 1500 (600 Ca) MG TABS tablet Take 1,500 mg by mouth 2 (two) times daily with a meal.    . docusate sodium (COLACE) 100 MG capsule Take 100 mg by mouth 2 (two) times daily.    . fluticasone (FLONASE) 50 MCG/ACT nasal spray Place 2 sprays into both nostrils daily.    [name] GLIPIZIDE XL 2.5 MG 24 hr tablet Take 2.5 mg by mouth daily with breakfast.     . hydrochlorothiazide (MICROZIDE) 12.5 MG capsule Take 12.5 mg by mouth daily.     [name] letrozole (FEMARA) 2.5 MG tablet Take 1 tablet (2.5 mg total) by mouth daily. 90 tablet 3  . lisinopril (PRINIVIL,ZESTRIL) 40 MG tablet Take 40 mg by mouth every morning.     . montelukast (SINGULAIR) 10 MG tablet Take 10 mg by mouth at bedtime.    [name] omeprazole (PRILOSEC) 20 MG capsule Take 20 mg by mouth every morning.     . OXYGEN Inhale 2 L into the lungs continuous.     . palbociclib (IBRANCE) 125 MG capsule     . SPIRIVA HANDIHALER 18 MCG inhalation capsule Place 18 mcg into inhaler and inhale every morning.     [name] tiZANidine (ZANAFLEX) 2 MG tablet     . oxyCODONE-acetaminophen (ROXICET) 5-325 MG tablet Take 1 tablet by mouth daily. 30 tablet 0   No current facility-administered medications for this visit.     PHYSICAL EXAMINATION: ECOG PERFORMANCE STATUS: 0 - Asymptomatic  BP 108/76 (BP Location: Right Arm, Patient Position: Sitting)   Pulse 94   Temp 97.5 F (36.4 C) (Tympanic)   Resp 18   Wt 170 lb (77.1 kg)   SpO2 95%   BMI 31.09 kg/m   Filed Weights   06/18/16 1448  Weight: 170 lb (77.1 kg)    GENERAL: Well-nourished well-developed; Alert, no distress and comfortable.   Alone.  EYES: no pallor or icterus OROPHARYNX: no thrush or ulceration;poor dentition.   NECK: supple, no masses felt LYMPH:  no palpable lymphadenopathy in the cervical, axillary or inguinal regions LUNGS: clear to auscultation and  No wheeze or crackles HEART/CVS: regular rate & rhythm and no murmurs; No lower extremity edema ABDOMEN:abdomen soft, non-tender and  normal bowel sounds Musculoskeletal:no cyanosis of digits and no clubbing  PSYCH: alert & oriented x 3 with fluent speech NEURO: no focal motor/sensory deficits SKIN:  no rashes or significant lesions   LABORATORY DATA:  I have reviewed the data as listed    Component Value Date/Time   NA 139 06/18/2016 1421   NA 133 (L) 04/08/2014 1738   K 4.5 06/18/2016 1421   K 4.2 04/08/2014 1738   CL 104 06/18/2016 1421   CL 99 04/08/2014 1738  CO2 24 06/18/2016 1421   CO2 28 04/08/2014 1738   GLUCOSE 82 06/18/2016 1421   GLUCOSE 116 (H) 04/08/2014 1738   BUN 25 (H) 06/18/2016 1421   BUN 5 (L) 04/08/2014 1738   CREATININE 1.47 (H) 06/18/2016 1421   CREATININE 0.42 (L) 04/08/2014 1738   CALCIUM 9.5 06/18/2016 1421   CALCIUM 9.5 04/08/2014 1738   PROT 7.6 06/18/2016 1421   ALBUMIN 4.0 06/18/2016 1421   AST 15 06/18/2016 1421   ALT 8 (L) 06/18/2016 1421   ALKPHOS 61 06/18/2016 1421   BILITOT 0.5 06/18/2016 1421   GFRNONAA 35 (L) 06/18/2016 1421   GFRNONAA >60 04/08/2014 1738   GFRAA 41 (L) 06/18/2016 1421   GFRAA >60 04/08/2014 1738    No results found for: SPEP, UPEP  Lab Results  Component Value Date   WBC 4.1 06/18/2016   NEUTROABS 2.0 06/18/2016   HGB 8.6 (L) 06/18/2016   HCT 25.8 (L) 06/18/2016   MCV 97.6 06/18/2016   PLT 242 06/18/2016      Chemistry      Component Value Date/Time   NA 139 06/18/2016 1421   NA 133 (L) 04/08/2014 1738   K 4.5 06/18/2016 1421   K 4.2 04/08/2014 1738   CL 104 06/18/2016 1421   CL 99 04/08/2014 1738   CO2 24 06/18/2016 1421   CO2 28 04/08/2014 1738   BUN 25 (H) 06/18/2016 1421   BUN 5 (L) 04/08/2014 1738   CREATININE 1.47 (H) 06/18/2016 1421   CREATININE 0.42 (L) 04/08/2014 1738      Component Value Date/Time   CALCIUM 9.5 06/18/2016 1421   CALCIUM 9.5 04/08/2014 1738   ALKPHOS 61 06/18/2016 1421   AST 15 06/18/2016 1421   ALT 8 (L) 06/18/2016 1421   BILITOT 0.5 06/18/2016 1421       RADIOGRAPHIC STUDIES: I have  personally reviewed the radiological images as listed and agreed with the findings in the report. No results found.   ASSESSMENT & PLAN:   # Metastatic breast cancer/lobular ER/PR positive HER-2/neu negative- on ibrance and Femara . Significant response noted. s/p palliative mastectomy on the left side. Clinically no evidence of disease. Patient's most recent CT scan on May 31, 2016 which included a CT scan of her neck did not reveal any evidence of disease.   # Continue ibrance and letrozole as prescribed.  Labs from today reviewed; acceptable for treatment.   # neck spasm-not improved; patient was given a refill of her Percocet today.   # Follow up in 4 weeks/labs.  # Anemia: Patient's hemoglobin is 8.6, monitor.  All questions were answered. The patient knows to call the clinic with any problems, questions or concerns.      Lloyd Huger, MD 06/18/2016 11:47 PM

## 2016-07-16 ENCOUNTER — Inpatient Hospital Stay: Payer: Medicare HMO

## 2016-07-16 ENCOUNTER — Inpatient Hospital Stay: Payer: Medicare HMO | Attending: Internal Medicine | Admitting: Internal Medicine

## 2016-07-16 VITALS — BP 123/75 | HR 87 | Temp 97.1°F | Resp 18 | Wt 167.4 lb

## 2016-07-16 DIAGNOSIS — E119 Type 2 diabetes mellitus without complications: Secondary | ICD-10-CM | POA: Diagnosis not present

## 2016-07-16 DIAGNOSIS — Z801 Family history of malignant neoplasm of trachea, bronchus and lung: Secondary | ICD-10-CM | POA: Diagnosis not present

## 2016-07-16 DIAGNOSIS — Z9013 Acquired absence of bilateral breasts and nipples: Secondary | ICD-10-CM

## 2016-07-16 DIAGNOSIS — K219 Gastro-esophageal reflux disease without esophagitis: Secondary | ICD-10-CM

## 2016-07-16 DIAGNOSIS — C50812 Malignant neoplasm of overlapping sites of left female breast: Secondary | ICD-10-CM | POA: Diagnosis not present

## 2016-07-16 DIAGNOSIS — Z7982 Long term (current) use of aspirin: Secondary | ICD-10-CM | POA: Diagnosis not present

## 2016-07-16 DIAGNOSIS — F17210 Nicotine dependence, cigarettes, uncomplicated: Secondary | ICD-10-CM

## 2016-07-16 DIAGNOSIS — Z853 Personal history of malignant neoplasm of breast: Secondary | ICD-10-CM

## 2016-07-16 DIAGNOSIS — M542 Cervicalgia: Secondary | ICD-10-CM | POA: Diagnosis not present

## 2016-07-16 DIAGNOSIS — Z79811 Long term (current) use of aromatase inhibitors: Secondary | ICD-10-CM

## 2016-07-16 DIAGNOSIS — R252 Cramp and spasm: Secondary | ICD-10-CM

## 2016-07-16 DIAGNOSIS — M1990 Unspecified osteoarthritis, unspecified site: Secondary | ICD-10-CM

## 2016-07-16 DIAGNOSIS — C50912 Malignant neoplasm of unspecified site of left female breast: Secondary | ICD-10-CM

## 2016-07-16 DIAGNOSIS — I10 Essential (primary) hypertension: Secondary | ICD-10-CM

## 2016-07-16 DIAGNOSIS — Z170 Estrogen receptor positive status [ER+]: Secondary | ICD-10-CM

## 2016-07-16 DIAGNOSIS — Z79899 Other long term (current) drug therapy: Secondary | ICD-10-CM

## 2016-07-16 DIAGNOSIS — J449 Chronic obstructive pulmonary disease, unspecified: Secondary | ICD-10-CM

## 2016-07-16 LAB — CBC WITH DIFFERENTIAL/PLATELET
BASOS PCT: 3 %
Basophils Absolute: 0.1 10*3/uL (ref 0–0.1)
EOS ABS: 0.1 10*3/uL (ref 0–0.7)
EOS PCT: 2 %
HCT: 27 % — ABNORMAL LOW (ref 35.0–47.0)
Hemoglobin: 9 g/dL — ABNORMAL LOW (ref 12.0–16.0)
Lymphocytes Relative: 37 %
Lymphs Abs: 1.4 10*3/uL (ref 1.0–3.6)
MCH: 32 pg (ref 26.0–34.0)
MCHC: 33.3 g/dL (ref 32.0–36.0)
MCV: 96 fL (ref 80.0–100.0)
MONO ABS: 0.2 10*3/uL (ref 0.2–0.9)
MONOS PCT: 6 %
Neutro Abs: 2 10*3/uL (ref 1.4–6.5)
Neutrophils Relative %: 52 %
Platelets: 284 10*3/uL (ref 150–440)
RBC: 2.82 MIL/uL — ABNORMAL LOW (ref 3.80–5.20)
RDW: 15.6 % — AB (ref 11.5–14.5)
WBC: 3.8 10*3/uL (ref 3.6–11.0)

## 2016-07-16 LAB — COMPREHENSIVE METABOLIC PANEL
ALBUMIN: 4.2 g/dL (ref 3.5–5.0)
ALK PHOS: 68 U/L (ref 38–126)
ALT: 8 U/L — AB (ref 14–54)
AST: 16 U/L (ref 15–41)
Anion gap: 9 (ref 5–15)
BUN: 19 mg/dL (ref 6–20)
CALCIUM: 9.5 mg/dL (ref 8.9–10.3)
CO2: 26 mmol/L (ref 22–32)
CREATININE: 1.27 mg/dL — AB (ref 0.44–1.00)
Chloride: 101 mmol/L (ref 101–111)
GFR calc non Af Amer: 42 mL/min — ABNORMAL LOW (ref >60)
GFR, EST AFRICAN AMERICAN: 49 mL/min — AB (ref >60)
GLUCOSE: 63 mg/dL — AB (ref 65–99)
Potassium: 4.3 mmol/L (ref 3.5–5.1)
SODIUM: 136 mmol/L (ref 135–145)
Total Bilirubin: 0.4 mg/dL (ref 0.3–1.2)
Total Protein: 7.7 g/dL (ref 6.5–8.1)

## 2016-07-16 MED ORDER — OXYCODONE-ACETAMINOPHEN 5-325 MG PO TABS: 1.0000 | ORAL_TABLET | Freq: Two times a day (BID) | ORAL | 0 refills | Status: DC | PRN

## 2016-07-16 NOTE — Progress Notes (Signed)
Patient is here for follow up, she is having pain in her neck that is worse and pain in her shoulder she would like something for the pain

## 2016-07-16 NOTE — Progress Notes (Signed)
[place] OFFICE PROGRESS NOTE  Patient Care Team: Donnie Coffin, MD as PCP - General (Family Medicine) Donnie Coffin, MD as Referring Physician (Family Medicine) Seeplaputhur Robinette Haines, MD (General Surgery) Forest Gleason, MD (Oncology)  Cancer of left breast [facility])   Staging form: Breast, AJCC 7th Edition     Clinical: Stage IIIB (T4, N1, cM0(i+)) - Signed by Forest Gleason, MD on 08/09/2015     Pathologic: Stage IV (T4, N1, M1) - Signed by Forest Gleason, MD on 08/16/2015    Oncology History   # DEC 2016-  LEFT BREAST CA- LOBULAR CA ER/PR-Pos; her 2 NEG STAGE IV [[id number]- left breast/bil Ax LN/Media LN/RP LN; skeletal mets]; DEC 2016- LETROZOLE + IBRANCE; April 2017- Significant response to treatment- [Dr.Sankar]; s/p Left mastec & ALND [palliative]; cont Leslee Home + Letrzole  # OCT 5th CT NED.   # RIGHT BREAST CA [s/p mastectomy [facility]]     Cancer of left breast ([place])   08/09/2015 Initial Diagnosis    Cancer of left breast (HCC)       Carcinoma of overlapping sites of left breast in female, estrogen receptor positive ([place])      INTERVAL HISTORY:  Sophia Nelson 70 y.o.  female pleasant patient above history of Metastatic breast cancer lobular carcinoma currently on ibrance and letrozole- Status post palliative mastectomy on the left side is here for follow-up.  Patient continues to complain of bilateral neck pain; and difficulty lifting her right shoulder. Today is the last day of ibrance. She stated that she has been needing to take 2 Percocets a day.   Appetite is good. No weight loss. No nausea no vomiting. No chest pain. No fevers. No bone pain. She has been compliant with her medications.   REVIEW OF SYSTEMS:  A complete 10 point review of system is done which is negative except mentioned above/history of present illness.   PAST MEDICAL HISTORY :  Past Medical History:  Diagnosis Date  . Anemia   . Arthritis   . Asthma   . Breast cancer ([place]) 2009   RT  MASTECTOMY, DCIS  . Cancer of left breast ([place]) 08/09/2015   T4 N1 M1 tumor, INVASIVE LOBULAR CARCINOMA.  [name] COPD (chronic obstructive pulmonary disease) ([place])   . Diabetes mellitus without complication ([place])   . GERD (gastroesophageal reflux disease)   . Headache    h/o migraines as a child  . Hypertension   . Pneumonia 2015  . Shortness of breath dyspnea    with exertion    PAST SURGICAL HISTORY :   Past Surgical History:  Procedure Laterality Date  . ABDOMINAL HYSTERECTOMY    . BREAST SURGERY Right 2015   mastectomy  . SIMPLE MASTECTOMY WITH AXILLARY SENTINEL NODE BIOPSY Left 02/29/2016   Procedure: SIMPLE MASTECTOMY;  Surgeon: Christene Lye, MD;  Location: [facility] ORS;  Service: General;  Laterality: Left;  . TUBAL LIGATION      FAMILY HISTORY :   Family History  Problem Relation Age of Onset  . Lung cancer Father   . Breast cancer Neg Hx     SOCIAL HISTORY:   Social History  Substance Use Topics  . Smoking status: Current Some Day Smoker    Packs/day: 0.25    Years: 15.00    Types: Cigarettes    Last attempt to quit: 02/25/2016  . Smokeless tobacco: Never Used     Comment: currently as of 02-21-16 pt states she is only smoking 2-3 cigarettes  per day  . 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  MEDICATIONS:  Current Outpatient Prescriptions  Medication Sig Dispense Refill  . ADVAIR DISKUS 500-50 MCG/DOSE AEPB Inhale 1 puff into the lungs 2 (two) times daily.     [name] albuterol (PROVENTIL HFA;VENTOLIN HFA) 108 (90 Base) MCG/ACT inhaler Inhale 2 puffs into the lungs every 6 (six) hours as needed for wheezing or shortness of breath.    [name] aspirin 81 MG tablet Take 81 mg by mouth daily.    . calcium carbonate (OSCAL) 1500 (600 Ca) MG TABS tablet Take 1,500 mg by mouth 2 (two) times daily with a meal.    . docusate sodium (COLACE) 100 MG  capsule Take 100 mg by mouth 2 (two) times daily.    . fluticasone (FLONASE) 50 MCG/ACT nasal spray Place 2 sprays into both nostrils daily.    [name] GLIPIZIDE XL 2.5 MG 24 hr tablet Take 2.5 mg by mouth daily with breakfast.     . hydrochlorothiazide (MICROZIDE) 12.5 MG capsule Take 12.5 mg by mouth daily.     [name] letrozole (FEMARA) 2.5 MG tablet Take 1 tablet (2.5 mg total) by mouth daily. 90 tablet 3  . lisinopril (PRINIVIL,ZESTRIL) 40 MG tablet Take 40 mg by mouth every morning.     . montelukast (SINGULAIR) 10 MG tablet Take 10 mg by mouth at bedtime.    [name] omeprazole (PRILOSEC) 20 MG capsule Take 20 mg by mouth every morning.     [name] oxyCODONE-acetaminophen (ROXICET) 5-325 MG tablet Take 1 tablet by mouth every 12 (twelve) hours as needed for severe pain. 60 tablet 0  . OXYGEN Inhale 2 L into the lungs continuous.     . palbociclib (IBRANCE) 125 MG capsule     . SPIRIVA HANDIHALER 18 MCG inhalation capsule Place 18 mcg into inhaler and inhale every morning.     [name] tiZANidine (ZANAFLEX) 2 MG tablet      No current facility-administered medications for this visit.     PHYSICAL EXAMINATION: ECOG PERFORMANCE STATUS: 0 - Asymptomatic  BP 123/75 (BP Location: Right Arm, Patient Position: Sitting)   Pulse 87   Temp 97.1 F (36.2 C) (Tympanic)   Resp 18   Wt 167 lb 6.4 oz (75.9 kg)   BMI 30.62 kg/m   Filed Weights   07/16/16 1541  Weight: 167 lb 6.4 oz (75.9 kg)    GENERAL: Well-nourished well-developed; Alert, no distress and comfortable.   Alone.  EYES: no pallor or icterus OROPHARYNX: no thrush or ulceration;poor dentition.   NECK: supple, no masses felt LYMPH:  no palpable lymphadenopathy in the cervical, axillary or inguinal regions LUNGS: clear to auscultation and  No wheeze or crackles HEART/CVS: regular rate & rhythm and no murmurs; No lower extremity edema ABDOMEN:abdomen soft, non-tender and normal bowel sounds Musculoskeletal:no cyanosis of digits and no clubbing  PSYCH:  alert & oriented x 3 with fluent speech NEURO: no focal motor/sensory deficits SKIN:  no rashes or significant lesions   LABORATORY DATA:  I have reviewed the data as listed    Component Value Date/Time   NA 136 07/16/2016 1503   NA 133 (L) 04/08/2014 1738   K 4.3 07/16/2016 1503   K 4.2 04/08/2014 1738   CL 101 07/16/2016 1503   CL 99 04/08/2014 1738   CO2 26  07/16/2016 1503   CO2 28 04/08/2014 1738   GLUCOSE 63 (L) 07/16/2016 1503   GLUCOSE 116 (H) 04/08/2014 1738   BUN 19 07/16/2016 1503   BUN 5 (L) 04/08/2014 1738   CREATININE 1.27 (H) 07/16/2016 1503   CREATININE 0.42 (L) 04/08/2014 1738   CALCIUM 9.5 07/16/2016 1503   CALCIUM 9.5 04/08/2014 1738   PROT 7.7 07/16/2016 1503   ALBUMIN 4.2 07/16/2016 1503   AST 16 07/16/2016 1503   ALT 8 (L) 07/16/2016 1503   ALKPHOS 68 07/16/2016 1503   BILITOT 0.4 07/16/2016 1503   GFRNONAA 42 (L) 07/16/2016 1503   GFRNONAA >60 04/08/2014 1738   GFRAA 49 (L) 07/16/2016 1503   GFRAA >60 04/08/2014 1738    No results found for: SPEP, UPEP  Lab Results  Component Value Date   WBC 3.8 07/16/2016   NEUTROABS 2.0 07/16/2016   HGB 9.0 (L) 07/16/2016   HCT 27.0 (L) 07/16/2016   MCV 96.0 07/16/2016   PLT 284 07/16/2016      Chemistry      Component Value Date/Time   NA 136 07/16/2016 1503   NA 133 (L) 04/08/2014 1738   K 4.3 07/16/2016 1503   K 4.2 04/08/2014 1738   CL 101 07/16/2016 1503   CL 99 04/08/2014 1738   CO2 26 07/16/2016 1503   CO2 28 04/08/2014 1738   BUN 19 07/16/2016 1503   BUN 5 (L) 04/08/2014 1738   CREATININE 1.27 (H) 07/16/2016 1503   CREATININE 0.42 (L) 04/08/2014 1738      Component Value Date/Time   CALCIUM 9.5 07/16/2016 1503   CALCIUM 9.5 04/08/2014 1738   ALKPHOS 68 07/16/2016 1503   AST 16 07/16/2016 1503   ALT 8 (L) 07/16/2016 1503   BILITOT 0.4 07/16/2016 1503       RADIOGRAPHIC STUDIES: I have personally reviewed the radiological images as listed and agreed with the findings in the  report. No results found.   ASSESSMENT & PLAN:  Carcinoma of overlapping sites of left breast in female, estrogen receptor positive ([place]) # Metastatic breast cancer/lobular ER/PR positive HER-2/neu negative- on ibrance and Femara . Significant response noted. s/p palliative mastectomy on the left side. Clinically no evidence of progression. OCT 2017- CT NED.  # Continue letrozole + Ibrance.  Labs from today reviewed; acceptable for treatment.   # neck spasm-not improved/ ? Bursitis; on percocet BID. New script given. Recommend evaluation with orthopedics/for possible intra-articular injections. Patient reluctant.  # Follow up in 4 weeks/labs.    Orders Placed This Encounter  Procedures  . CBC with Differential    Standing Status:   Future    Standing Expiration Date:   07/16/2017  . Comprehensive metabolic panel    Standing Status:   Future    Standing Expiration Date:   07/16/2017  . Cancer antigen 27.29    Standing Status:   Future    Standing Expiration Date:   07/16/2017   All questions were answered. The patient knows to call the clinic with any problems, questions or concerns.      [name], MD 07/16/2016 4:48 PM

## 2016-07-16 NOTE — Assessment & Plan Note (Addendum)
#  Metastatic breast cancer/lobular ER/PR positive HER-2/neu negative- on ibrance and Femara . Significant response noted. s/p palliative mastectomy on the left side. Clinically no evidence of progression. OCT 2017- CT NED.  # Continue letrozole + Ibrance.  Labs from today reviewed; acceptable for treatment.   # neck spasm-not improved/ ? Bursitis; on percocet BID. New script given. Recommend evaluation with orthopedics/for possible intra-articular injections. Patient reluctant.  # Follow up in 4 weeks/labs.

## 2016-08-02 ENCOUNTER — Other Ambulatory Visit: Payer: Self-pay | Admitting: Oncology

## 2016-08-02 DIAGNOSIS — C50912 Malignant neoplasm of unspecified site of left female breast: Secondary | ICD-10-CM

## 2016-08-13 ENCOUNTER — Inpatient Hospital Stay: Payer: Medicare HMO | Attending: Internal Medicine | Admitting: Internal Medicine

## 2016-08-13 ENCOUNTER — Inpatient Hospital Stay: Payer: Medicare HMO

## 2016-08-13 VITALS — BP 94/60 | HR 83 | Temp 97.6°F | Ht 62.0 in | Wt 167.0 lb

## 2016-08-13 DIAGNOSIS — M25512 Pain in left shoulder: Secondary | ICD-10-CM | POA: Diagnosis not present

## 2016-08-13 DIAGNOSIS — C50812 Malignant neoplasm of overlapping sites of left female breast: Secondary | ICD-10-CM | POA: Diagnosis not present

## 2016-08-13 DIAGNOSIS — I129 Hypertensive chronic kidney disease with stage 1 through stage 4 chronic kidney disease, or unspecified chronic kidney disease: Secondary | ICD-10-CM

## 2016-08-13 DIAGNOSIS — N189 Chronic kidney disease, unspecified: Secondary | ICD-10-CM | POA: Diagnosis not present

## 2016-08-13 DIAGNOSIS — Z9013 Acquired absence of bilateral breasts and nipples: Secondary | ICD-10-CM | POA: Diagnosis not present

## 2016-08-13 DIAGNOSIS — Z170 Estrogen receptor positive status [ER+]: Secondary | ICD-10-CM

## 2016-08-13 DIAGNOSIS — Z853 Personal history of malignant neoplasm of breast: Secondary | ICD-10-CM

## 2016-08-13 DIAGNOSIS — J449 Chronic obstructive pulmonary disease, unspecified: Secondary | ICD-10-CM | POA: Diagnosis not present

## 2016-08-13 DIAGNOSIS — Z7982 Long term (current) use of aspirin: Secondary | ICD-10-CM | POA: Diagnosis not present

## 2016-08-13 DIAGNOSIS — N179 Acute kidney failure, unspecified: Secondary | ICD-10-CM

## 2016-08-13 DIAGNOSIS — F17210 Nicotine dependence, cigarettes, uncomplicated: Secondary | ICD-10-CM | POA: Diagnosis not present

## 2016-08-13 DIAGNOSIS — K219 Gastro-esophageal reflux disease without esophagitis: Secondary | ICD-10-CM

## 2016-08-13 DIAGNOSIS — M542 Cervicalgia: Secondary | ICD-10-CM

## 2016-08-13 DIAGNOSIS — E1122 Type 2 diabetes mellitus with diabetic chronic kidney disease: Secondary | ICD-10-CM | POA: Diagnosis not present

## 2016-08-13 DIAGNOSIS — Z79811 Long term (current) use of aromatase inhibitors: Secondary | ICD-10-CM | POA: Diagnosis not present

## 2016-08-13 DIAGNOSIS — Z79899 Other long term (current) drug therapy: Secondary | ICD-10-CM

## 2016-08-13 LAB — CBC WITH DIFFERENTIAL/PLATELET
BASOS ABS: 0.1 10*3/uL (ref 0–0.1)
Basophils Relative: 2 %
EOS PCT: 2 %
Eosinophils Absolute: 0.1 10*3/uL (ref 0–0.7)
HCT: 25.7 % — ABNORMAL LOW (ref 35.0–47.0)
Hemoglobin: 8.6 g/dL — ABNORMAL LOW (ref 12.0–16.0)
LYMPHS PCT: 28 %
Lymphs Abs: 1.4 10*3/uL (ref 1.0–3.6)
MCH: 32.5 pg (ref 26.0–34.0)
MCHC: 33.6 g/dL (ref 32.0–36.0)
MCV: 96.8 fL (ref 80.0–100.0)
MONO ABS: 0.2 10*3/uL (ref 0.2–0.9)
MONOS PCT: 5 %
Neutro Abs: 3.1 10*3/uL (ref 1.4–6.5)
Neutrophils Relative %: 63 %
PLATELETS: 305 10*3/uL (ref 150–440)
RBC: 2.66 MIL/uL — ABNORMAL LOW (ref 3.80–5.20)
RDW: 16.3 % — AB (ref 11.5–14.5)
WBC: 4.8 10*3/uL (ref 3.6–11.0)

## 2016-08-13 LAB — COMPREHENSIVE METABOLIC PANEL
ALT: 7 U/L — ABNORMAL LOW (ref 14–54)
ANION GAP: 9 (ref 5–15)
AST: 15 U/L (ref 15–41)
Albumin: 3.5 g/dL (ref 3.5–5.0)
Alkaline Phosphatase: 59 U/L (ref 38–126)
BUN: 24 mg/dL — AB (ref 6–20)
CHLORIDE: 100 mmol/L — AB (ref 101–111)
CO2: 26 mmol/L (ref 22–32)
Calcium: 8.9 mg/dL (ref 8.9–10.3)
Creatinine, Ser: 1.64 mg/dL — ABNORMAL HIGH (ref 0.44–1.00)
GFR, EST AFRICAN AMERICAN: 36 mL/min — AB (ref >60)
GFR, EST NON AFRICAN AMERICAN: 31 mL/min — AB (ref >60)
Glucose, Bld: 101 mg/dL — ABNORMAL HIGH (ref 65–99)
POTASSIUM: 4.8 mmol/L (ref 3.5–5.1)
Sodium: 135 mmol/L (ref 135–145)
Total Bilirubin: 0.5 mg/dL (ref 0.3–1.2)
Total Protein: 6.7 g/dL (ref 6.5–8.1)

## 2016-08-13 MED ORDER — OXYCODONE-ACETAMINOPHEN 5-325 MG PO TABS
1.0000 | ORAL_TABLET | Freq: Two times a day (BID) | ORAL | 0 refills | Status: DC | PRN
Start: 2016-08-13 — End: 2016-09-17

## 2016-08-13 NOTE — Assessment & Plan Note (Signed)
#  Metastatic breast cancer/lobular ER/PR positive HER-2/neu negative- on ibrance and Femara . Significant response noted. s/p palliative mastectomy on the left side. Clinically no evidence of progression. OCT 2017- CT NED.  # Continue letrozole + Ibrance.  Labs from today reviewed; acceptable for treatment.  # CKD/ acute kidney injury creat 1.6; hold lisinopril; check BP at home; and call us if systolic > 254. Increase fluid intake. Recheck bmp in 10 days.   # Left shoulder pain? Bursitis; on percocet BID. New script given. Recommend evaluation with orthopedics/for possible intra-articular injections. Patient reluctant.  # Follow up in 4 weeks/labs/MD; labs in 10 days.

## 2016-08-13 NOTE — Progress Notes (Signed)
[place] OFFICE PROGRESS NOTE  Patient Care Team: Donnie Coffin, MD as PCP - General (Family Medicine) Donnie Coffin, MD as Referring Physician (Family Medicine) Seeplaputhur Robinette Haines, MD (General Surgery) Forest Gleason, MD (Oncology)  Sophia of left Sophia [facility])   Staging form: Sophia, AJCC 7th Edition     Clinical: Stage IIIB (T4, N1, cM0(i+)) - Signed by Forest Gleason, MD on 08/09/2015     Pathologic: Stage IV (T4, N1, M1) - Signed by Forest Gleason, MD on 08/16/2015    Oncology History   # DEC 2016-  LEFT Sophia CA- LOBULAR CA ER/PR-Pos; her 2 NEG STAGE IV [[id number]- left Sophia/bil Ax LN/Media LN/RP LN; skeletal mets]; DEC 2016- LETROZOLE + IBRANCE; April 2017- Significant response to treatment- [Dr.Sankar]; s/p Left mastec & ALND [palliative]; cont Leslee Home + Letrzole  # OCT 5th CT NED.   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  female pleasant patient above history of Metastatic Sophia Sophia lobular carcinoma currently on ibrance and letrozole- Is here for follow-up.  Patient's pain in the bilateral hip neck is significantly improved after taking Percocet. She is taking twice a day. She stated that she is able to function better.   Appetite is good. No weight loss. No nausea no vomiting. No chest pain. No fevers. No bone pain. She has been compliant with her medications.   REVIEW OF SYSTEMS:  A complete 10 point review of system is done which is negative except mentioned above/history of present illness.   PAST MEDICAL HISTORY :  Past Medical History:  Diagnosis Date  . Anemia   . Arthritis   . Asthma   . Sophia Sophia ([place]) 2009   RT MASTECTOMY, DCIS  . Sophia of left Sophia ([place]) 08/09/2015   T4 N1 M1 tumor,  INVASIVE LOBULAR CARCINOMA.  [name] COPD (chronic obstructive pulmonary disease) ([place])   . Diabetes mellitus without complication ([place])   . GERD (gastroesophageal reflux disease)   . Headache    h/o migraines as a child  . Hypertension   . Pneumonia 2015  . Shortness of breath dyspnea    with exertion    PAST SURGICAL HISTORY :   Past Surgical History:  Procedure Laterality Date  . ABDOMINAL HYSTERECTOMY    . Sophia SURGERY Right 2015   mastectomy  . SIMPLE MASTECTOMY WITH AXILLARY SENTINEL NODE BIOPSY Left 02/29/2016   Procedure: SIMPLE MASTECTOMY;  Surgeon: Christene Lye, MD;  Location: [facility] ORS;  Service: General;  Laterality: Left;  . TUBAL LIGATION      FAMILY HISTORY :   Family History  Problem Relation Age of Onset  . Sophia Nelson   . Sophia Sophia Neg Hx     SOCIAL HISTORY:   Social History  Substance Use Topics  . Smoking status: Current Some Day Smoker    Packs/day: 0.25    Years: 15.00    Types: Cigarettes    Last attempt to quit: 02/25/2016  . Smokeless tobacco: Never Used     Comment: currently as of 02-21-16 pt states she is only smoking 2-3 cigarettes per day  . 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  MEDICATIONS:  Current Outpatient Prescriptions  Medication Sig Dispense Refill  . ADVAIR DISKUS 500-50 MCG/DOSE AEPB Inhale 1 puff into the lungs 2 (two) times daily.     [name] albuterol (PROVENTIL HFA;VENTOLIN HFA) 108 (90 Base) MCG/ACT inhaler Inhale 2 puffs into the lungs every 6 (six) hours as needed for wheezing or shortness of breath.    [name] aspirin 81 MG tablet Take 81 mg by mouth daily.    . calcium carbonate (OSCAL) 1500 (600 Ca) MG TABS tablet Take 1,500 mg by mouth 2 (two) times daily with a meal.    . docusate sodium (COLACE) 100 MG capsule Take 100 mg by mouth 2 (two) times daily.    . fluticasone (FLONASE) 50  MCG/ACT nasal spray Place 2 sprays into both nostrils daily.    [name] GLIPIZIDE XL 2.5 MG 24 hr tablet Take 2.5 mg by mouth daily with breakfast.     . letrozole (FEMARA) 2.5 MG tablet TAKE 1 TABLET (2.5 MG TOTAL) BY MOUTH DAILY. 90 tablet PRN  . lisinopril (PRINIVIL,ZESTRIL) 40 MG tablet Take 40 mg by mouth every morning.     . montelukast (SINGULAIR) 10 MG tablet Take 10 mg by mouth at bedtime.    [name] omeprazole (PRILOSEC) 20 MG capsule Take 20 mg by mouth every morning.     [name] oxyCODONE-acetaminophen (ROXICET) 5-325 MG tablet Take 1 tablet by mouth every 12 (twelve) hours as needed for severe pain. 60 tablet 0  . OXYGEN Inhale 2 L into the lungs continuous.     . palbociclib (IBRANCE) 125 MG capsule Take 125 mg by mouth daily with lunch.     . SPIRIVA HANDIHALER 18 MCG inhalation capsule Place 18 mcg into inhaler and inhale every morning.      No current facility-administered medications for this visit.     PHYSICAL EXAMINATION: ECOG PERFORMANCE STATUS: 0 - Asymptomatic  BP 94/60 (BP Location: Right Arm, Patient Position: Sitting)   Pulse 83   Temp 97.6 F (36.4 C) (Tympanic)   Ht 5' 2"  (1.575 m)   Wt 167 lb (75.8 kg)   BMI 30.54 kg/m   Filed Weights   08/13/16 1553  Weight: 167 lb (75.8 kg)    GENERAL: Well-nourished well-developed; Alert, no distress and comfortable.   Alone.  EYES: no pallor or icterus OROPHARYNX: no thrush or ulceration;poor dentition.   NECK: supple, no masses felt LYMPH:  no palpable lymphadenopathy in the cervical, axillary or inguinal regions LUNGS: clear to auscultation and  No wheeze or crackles HEART/CVS: regular rate & rhythm and no murmurs; No lower extremity edema ABDOMEN:abdomen soft, non-tender and normal bowel sounds Musculoskeletal:no cyanosis of digits and no clubbing  PSYCH: alert & oriented x 3 with fluent speech NEURO: no focal motor/sensory deficits SKIN:  no rashes or significant lesions   LABORATORY DATA:  I have reviewed the data as  listed    Component Value Date/Time   NA 135 08/13/2016 1450   NA 133 (L) 04/08/2014 1738   K 4.8 08/13/2016 1450   K 4.2 04/08/2014 1738   CL 100 (L) 08/13/2016 1450   CL 99 04/08/2014 1738   CO2 26 08/13/2016 1450   CO2 28 04/08/2014 1738   GLUCOSE 101 (H) 08/13/2016 1450   GLUCOSE 116 (H) 04/08/2014 1738   BUN 24 (H) 08/13/2016 1450   BUN  5 (L) 04/08/2014 1738   CREATININE 1.64 (H) 08/13/2016 1450   CREATININE 0.42 (L) 04/08/2014 1738   CALCIUM 8.9 08/13/2016 1450   CALCIUM 9.5 04/08/2014 1738   PROT 6.7 08/13/2016 1450   ALBUMIN 3.5 08/13/2016 1450   AST 15 08/13/2016 1450   ALT 7 (L) 08/13/2016 1450   ALKPHOS 59 08/13/2016 1450   BILITOT 0.5 08/13/2016 1450   GFRNONAA 31 (L) 08/13/2016 1450   GFRNONAA >60 04/08/2014 1738   GFRAA 36 (L) 08/13/2016 1450   GFRAA >60 04/08/2014 1738    No results found for: SPEP, UPEP  Lab Results  Component Value Date   WBC 4.8 08/13/2016   NEUTROABS 3.1 08/13/2016   HGB 8.6 (L) 08/13/2016   HCT 25.7 (L) 08/13/2016   MCV 96.8 08/13/2016   PLT 305 08/13/2016      Chemistry      Component Value Date/Time   NA 135 08/13/2016 1450   NA 133 (L) 04/08/2014 1738   K 4.8 08/13/2016 1450   K 4.2 04/08/2014 1738   CL 100 (L) 08/13/2016 1450   CL 99 04/08/2014 1738   CO2 26 08/13/2016 1450   CO2 28 04/08/2014 1738   BUN 24 (H) 08/13/2016 1450   BUN 5 (L) 04/08/2014 1738   CREATININE 1.64 (H) 08/13/2016 1450   CREATININE 0.42 (L) 04/08/2014 1738      Component Value Date/Time   CALCIUM 8.9 08/13/2016 1450   CALCIUM 9.5 04/08/2014 1738   ALKPHOS 59 08/13/2016 1450   AST 15 08/13/2016 1450   ALT 7 (L) 08/13/2016 1450   BILITOT 0.5 08/13/2016 1450       RADIOGRAPHIC STUDIES: I have personally reviewed the radiological images as listed and agreed with the findings in the report. No results found.   ASSESSMENT & PLAN:  Carcinoma of overlapping sites of left Sophia in female, estrogen receptor positive ([place]) # Metastatic  Sophia Sophia/lobular ER/PR positive HER-2/neu negative- on ibrance and Femara . Significant response noted. s/p palliative mastectomy on the left side. Clinically no evidence of progression. OCT 2017- CT NED.  # Continue letrozole + Ibrance.  Labs from today reviewed; acceptable for treatment.  # CKD/ acute kidney injury creat 1.6; hold lisinopril; check BP at home; and call us if systolic > 175. Increase fluid intake. Recheck bmp in 10 days.   # Left shoulder pain? Bursitis; on percocet BID. New script given. Recommend evaluation with orthopedics/for possible intra-articular injections. Patient reluctant.  # Follow up in 4 weeks/labs/MD; labs in 10 days.    Orders Placed This Encounter  Procedures  . Basic metabolic panel    Standing Status:   Future    Standing Expiration Date:   08/13/2017  . Comprehensive metabolic panel    Standing Status:   Future    Standing Expiration Date:   08/13/2017  . CBC with Differential    Standing Status:   Future    Standing Expiration Date:   08/13/2017   All questions were answered. The patient knows to call the clinic with any problems, questions or concerns.      [name], MD 08/13/2016 5:31 PM

## 2016-08-13 NOTE — Progress Notes (Signed)
Patiet here today for follow up Carcinoma of overlapping sites of left breast in female.

## 2016-08-14 LAB — CANCER ANTIGEN 27.29: CA 27.29: 22 U/mL (ref 0.0–38.6)

## 2016-08-23 ENCOUNTER — Inpatient Hospital Stay: Payer: Medicare HMO

## 2016-08-23 DIAGNOSIS — C50812 Malignant neoplasm of overlapping sites of left female breast: Secondary | ICD-10-CM | POA: Diagnosis not present

## 2016-08-23 DIAGNOSIS — Z170 Estrogen receptor positive status [ER+]: Principal | ICD-10-CM

## 2016-08-23 LAB — BASIC METABOLIC PANEL
ANION GAP: 7 (ref 5–15)
BUN: 14 mg/dL (ref 6–20)
CALCIUM: 9.7 mg/dL (ref 8.9–10.3)
CO2: 28 mmol/L (ref 22–32)
Chloride: 102 mmol/L (ref 101–111)
Creatinine, Ser: 1 mg/dL (ref 0.44–1.00)
GFR, EST NON AFRICAN AMERICAN: 57 mL/min — AB (ref >60)
Glucose, Bld: 144 mg/dL — ABNORMAL HIGH (ref 65–99)
Potassium: 4 mmol/L (ref 3.5–5.1)
Sodium: 137 mmol/L (ref 135–145)

## 2016-09-12 ENCOUNTER — Other Ambulatory Visit: Payer: Medicare HMO

## 2016-09-12 ENCOUNTER — Ambulatory Visit: Payer: Medicare HMO | Admitting: Hematology and Oncology

## 2016-09-13 ENCOUNTER — Other Ambulatory Visit: Payer: Medicare HMO

## 2016-09-13 ENCOUNTER — Ambulatory Visit: Payer: Medicare HMO | Admitting: Internal Medicine

## 2016-09-17 ENCOUNTER — Inpatient Hospital Stay: Payer: Medicare HMO

## 2016-09-17 ENCOUNTER — Inpatient Hospital Stay: Payer: Medicare HMO | Attending: Internal Medicine | Admitting: Internal Medicine

## 2016-09-17 VITALS — BP 108/66 | HR 93 | Temp 97.1°F | Wt 164.0 lb

## 2016-09-17 DIAGNOSIS — C50812 Malignant neoplasm of overlapping sites of left female breast: Secondary | ICD-10-CM

## 2016-09-17 DIAGNOSIS — N189 Chronic kidney disease, unspecified: Secondary | ICD-10-CM | POA: Diagnosis not present

## 2016-09-17 DIAGNOSIS — N179 Acute kidney failure, unspecified: Secondary | ICD-10-CM | POA: Diagnosis not present

## 2016-09-17 DIAGNOSIS — M542 Cervicalgia: Secondary | ICD-10-CM | POA: Diagnosis not present

## 2016-09-17 DIAGNOSIS — Z170 Estrogen receptor positive status [ER+]: Principal | ICD-10-CM

## 2016-09-17 DIAGNOSIS — Z7982 Long term (current) use of aspirin: Secondary | ICD-10-CM | POA: Insufficient documentation

## 2016-09-17 DIAGNOSIS — I129 Hypertensive chronic kidney disease with stage 1 through stage 4 chronic kidney disease, or unspecified chronic kidney disease: Secondary | ICD-10-CM | POA: Diagnosis not present

## 2016-09-17 DIAGNOSIS — Z801 Family history of malignant neoplasm of trachea, bronchus and lung: Secondary | ICD-10-CM | POA: Diagnosis not present

## 2016-09-17 DIAGNOSIS — M1990 Unspecified osteoarthritis, unspecified site: Secondary | ICD-10-CM | POA: Diagnosis not present

## 2016-09-17 DIAGNOSIS — F17210 Nicotine dependence, cigarettes, uncomplicated: Secondary | ICD-10-CM | POA: Insufficient documentation

## 2016-09-17 DIAGNOSIS — M25519 Pain in unspecified shoulder: Secondary | ICD-10-CM | POA: Diagnosis not present

## 2016-09-17 DIAGNOSIS — Z79811 Long term (current) use of aromatase inhibitors: Secondary | ICD-10-CM | POA: Insufficient documentation

## 2016-09-17 DIAGNOSIS — Z9013 Acquired absence of bilateral breasts and nipples: Secondary | ICD-10-CM | POA: Diagnosis not present

## 2016-09-17 DIAGNOSIS — Z79899 Other long term (current) drug therapy: Secondary | ICD-10-CM | POA: Diagnosis not present

## 2016-09-17 DIAGNOSIS — Z86000 Personal history of in-situ neoplasm of breast: Secondary | ICD-10-CM | POA: Insufficient documentation

## 2016-09-17 DIAGNOSIS — E1122 Type 2 diabetes mellitus with diabetic chronic kidney disease: Secondary | ICD-10-CM | POA: Diagnosis not present

## 2016-09-17 DIAGNOSIS — J449 Chronic obstructive pulmonary disease, unspecified: Secondary | ICD-10-CM | POA: Insufficient documentation

## 2016-09-17 DIAGNOSIS — K219 Gastro-esophageal reflux disease without esophagitis: Secondary | ICD-10-CM | POA: Insufficient documentation

## 2016-09-17 LAB — CBC WITH DIFFERENTIAL/PLATELET
BASOS ABS: 0 10*3/uL (ref 0–0.1)
Basophils Relative: 0 %
EOS ABS: 0.1 10*3/uL (ref 0–0.7)
EOS PCT: 2 %
HCT: 27.9 % — ABNORMAL LOW (ref 35.0–47.0)
Hemoglobin: 9.2 g/dL — ABNORMAL LOW (ref 12.0–16.0)
LYMPHS ABS: 1.9 10*3/uL (ref 1.0–3.6)
Lymphocytes Relative: 45 %
MCH: 32 pg (ref 26.0–34.0)
MCHC: 32.8 g/dL (ref 32.0–36.0)
MCV: 97.5 fL (ref 80.0–100.0)
Monocytes Absolute: 0.3 10*3/uL (ref 0.2–0.9)
Monocytes Relative: 8 %
Neutro Abs: 1.9 10*3/uL (ref 1.4–6.5)
Neutrophils Relative %: 45 %
PLATELETS: 231 10*3/uL (ref 150–440)
RBC: 2.86 MIL/uL — AB (ref 3.80–5.20)
RDW: 16.8 % — ABNORMAL HIGH (ref 11.5–14.5)
WBC: 4.2 10*3/uL (ref 3.6–11.0)

## 2016-09-17 LAB — COMPREHENSIVE METABOLIC PANEL
ALK PHOS: 66 U/L (ref 38–126)
ALT: 7 U/L — AB (ref 14–54)
AST: 15 U/L (ref 15–41)
Albumin: 3.9 g/dL (ref 3.5–5.0)
Anion gap: 8 (ref 5–15)
BILIRUBIN TOTAL: 0.4 mg/dL (ref 0.3–1.2)
BUN: 19 mg/dL (ref 6–20)
CALCIUM: 9.6 mg/dL (ref 8.9–10.3)
CO2: 26 mmol/L (ref 22–32)
CREATININE: 1.1 mg/dL — AB (ref 0.44–1.00)
Chloride: 104 mmol/L (ref 101–111)
GFR calc Af Amer: 58 mL/min — ABNORMAL LOW (ref >60)
GFR, EST NON AFRICAN AMERICAN: 50 mL/min — AB (ref >60)
Glucose, Bld: 91 mg/dL (ref 65–99)
Potassium: 4.5 mmol/L (ref 3.5–5.1)
Sodium: 138 mmol/L (ref 135–145)
TOTAL PROTEIN: 7.5 g/dL (ref 6.5–8.1)

## 2016-09-17 LAB — IRON AND TIBC
IRON: 69 ug/dL (ref 28–170)
SATURATION RATIOS: 24 % (ref 10.4–31.8)
TIBC: 286 ug/dL (ref 250–450)
UIBC: 217 ug/dL

## 2016-09-17 LAB — FERRITIN: FERRITIN: 190 ng/mL (ref 11–307)

## 2016-09-17 MED ORDER — OXYCODONE-ACETAMINOPHEN 5-325 MG PO TABS: 1.0000 | ORAL_TABLET | Freq: Two times a day (BID) | ORAL | 0 refills | Status: DC | PRN

## 2016-09-17 NOTE — Assessment & Plan Note (Addendum)
#  Metastatic breast cancer/lobular ER/PR positive HER-2/neu negative- on ibrance and Femara . Significant response noted. s/p palliative mastectomy on the left side. Clinically no evidence of progression. OCT 2017- CT NED.  # Continue letrozole + Ibrance.  Labs from today reviewed; Hb 9.5. acceptable for treatment.starting Ibrance again on 1/26th.   # Anemia hemoglobin 9.5. Slowly drifting down. We will check iron studies/ferritin today.  # CKD/ acute kidney injury creat 1.6; improved today; creat 1.1.  Continue lisinopril.  # Left shoulder pain? Bursitis; on percocet BID. New script given.   # Follow up in 4 weeks/labs/MD.

## 2016-09-17 NOTE — Progress Notes (Signed)
[place] OFFICE PROGRESS NOTE  Patient Care Team: Donnie Coffin, MD as PCP - General (Family Medicine) Donnie Coffin, MD as Referring Physician (Family Medicine) Seeplaputhur Robinette Haines, MD (General Surgery) Forest Gleason, MD (Oncology)  Cancer of left breast [facility])   Staging form: Breast, AJCC 7th Edition     Clinical: Stage IIIB (T4, N1, cM0(i+)) - Signed by Forest Gleason, MD on 08/09/2015     Pathologic: Stage IV (T4, N1, M1) - Signed by Forest Gleason, MD on 08/16/2015    Oncology History   # DEC 2016-  LEFT BREAST CA- LOBULAR CA ER/PR-Pos; her 2 NEG STAGE IV [[id number]- left breast/bil Ax LN/Media LN/RP LN; skeletal mets]; DEC 2016- LETROZOLE + IBRANCE; April 2017- Significant response to treatment- [Dr.Sankar]; s/p Left mastec & ALND [palliative]; cont Leslee Home + Letrzole  # OCT 5th CT NED.   # RIGHT BREAST CA [s/p mastectomy [facility]]     Cancer of left breast ([place])   08/09/2015 Initial Diagnosis    Cancer of left breast (HCC)       Carcinoma of overlapping sites of left breast in female, estrogen receptor positive ([place])      INTERVAL HISTORY:  Sophia Nelson 71 y.o.  female pleasant patient above history of Metastatic breast cancer lobular carcinoma currently on ibrance and letrozole- Is here for follow-up.  Patient's left neck pain and shoulder pain is much improved after being on Percocet twice a day.  Appetite is good. No weight loss. No nausea no vomiting. No chest pain. No fevers. No bone pain. She has been compliant with her medications. She is currently in the off week of her ibrance.  REVIEW OF SYSTEMS:  A complete 10 point review of system is done which is negative except mentioned above/history of present illness.   PAST MEDICAL HISTORY :  Past Medical History:  Diagnosis Date  . Anemia   . Arthritis   . Asthma   . Breast cancer ([place]) 2009   RT MASTECTOMY, DCIS  . Cancer of left breast ([place]) 08/09/2015   T4 N1 M1 tumor, INVASIVE LOBULAR  CARCINOMA.  [name] COPD (chronic obstructive pulmonary disease) ([place])   . Diabetes mellitus without complication ([place])   . GERD (gastroesophageal reflux disease)   . Headache    h/o migraines as a child  . Hypertension   . Pneumonia 2015  . Shortness of breath dyspnea    with exertion    PAST SURGICAL HISTORY :   Past Surgical History:  Procedure Laterality Date  . ABDOMINAL HYSTERECTOMY    . BREAST SURGERY Right 2015   mastectomy  . SIMPLE MASTECTOMY WITH AXILLARY SENTINEL NODE BIOPSY Left 02/29/2016   Procedure: SIMPLE MASTECTOMY;  Surgeon: Christene Lye, MD;  Location: [facility] ORS;  Service: General;  Laterality: Left;  . TUBAL LIGATION      FAMILY HISTORY :   Family History  Problem Relation Age of Onset  . Lung cancer Father   . Breast cancer Neg Hx     SOCIAL HISTORY:   Social History  Substance Use Topics  . Smoking status: Current Some Day Smoker    Packs/day: 0.25    Years: 15.00    Types: Cigarettes    Last attempt to quit: 02/25/2016  . Smokeless tobacco: Never Used     Comment: currently as of 02-21-16 pt states she is only smoking 2-3 cigarettes per day  . 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  MEDICATIONS:  Current Outpatient Prescriptions  Medication Sig Dispense Refill  . ADVAIR DISKUS 500-50 MCG/DOSE AEPB Inhale 1 puff into the lungs 2 (two) times daily.     [name] albuterol (PROVENTIL HFA;VENTOLIN HFA) 108 (90 Base) MCG/ACT inhaler Inhale 2 puffs into the lungs every 6 (six) hours as needed for wheezing or shortness of breath.    [name] aspirin 81 MG tablet Take 81 mg by mouth daily.    . calcium carbonate (OSCAL) 1500 (600 Ca) MG TABS tablet Take 1,500 mg by mouth 2 (two) times daily with a meal.    . docusate sodium (COLACE) 100 MG capsule Take 100 mg by mouth 2 (two) times daily.    . fluticasone (FLONASE) 50 MCG/ACT nasal  spray Place 2 sprays into both nostrils daily.    [name] GLIPIZIDE XL 2.5 MG 24 hr tablet Take 2.5 mg by mouth daily with breakfast.     . letrozole (FEMARA) 2.5 MG tablet TAKE 1 TABLET (2.5 MG TOTAL) BY MOUTH DAILY. 90 tablet PRN  . lisinopril (PRINIVIL,ZESTRIL) 40 MG tablet Take 40 mg by mouth every morning.     . montelukast (SINGULAIR) 10 MG tablet Take 10 mg by mouth at bedtime.    [name] omeprazole (PRILOSEC) 20 MG capsule Take 20 mg by mouth every morning.     [name] oxyCODONE-acetaminophen (ROXICET) 5-325 MG tablet Take 1 tablet by mouth every 12 (twelve) hours as needed for severe pain. 60 tablet 0  . OXYGEN Inhale 2 L into the lungs continuous.     . palbociclib (IBRANCE) 125 MG capsule Take 125 mg by mouth daily with lunch.     . SPIRIVA HANDIHALER 18 MCG inhalation capsule Place 18 mcg into inhaler and inhale every morning.      No current facility-administered medications for this visit.     PHYSICAL EXAMINATION: ECOG PERFORMANCE STATUS: 0 - Asymptomatic  BP 108/66 (BP Location: Right Arm, Patient Position: Sitting)   Pulse 93   Temp 97.1 F (36.2 C) (Tympanic)   Wt 164 lb (74.4 kg)   BMI 30.00 kg/m   Filed Weights   09/17/16 1551  Weight: 164 lb (74.4 kg)    GENERAL: Well-nourished well-developed; Alert, no distress and comfortable.   Alone.  EYES: no pallor or icterus OROPHARYNX: no thrush or ulceration;poor dentition.   NECK: supple, no masses felt LYMPH:  no palpable lymphadenopathy in the cervical, axillary or inguinal regions LUNGS: clear to auscultation and  No wheeze or crackles HEART/CVS: regular rate & rhythm and no murmurs; No lower extremity edema ABDOMEN:abdomen soft, non-tender and normal bowel sounds Musculoskeletal:no cyanosis of digits and no clubbing  PSYCH: alert & oriented x 3 with fluent speech NEURO: no focal motor/sensory deficits SKIN:  no rashes or significant lesions   LABORATORY DATA:  I have reviewed the data as listed    Component Value  Date/Time   NA 138 09/17/2016 1450   NA 133 (L) 04/08/2014 1738   K 4.5 09/17/2016 1450   K 4.2 04/08/2014 1738   CL 104 09/17/2016 1450   CL 99 04/08/2014 1738   CO2 26 09/17/2016 1450   CO2 28 04/08/2014 1738   GLUCOSE 91 09/17/2016 1450   GLUCOSE 116 (H) 04/08/2014 1738   BUN 19 09/17/2016 1450   BUN 5 (L) 04/08/2014 1738   CREATININE 1.10 (H) 09/17/2016 1450  CREATININE 0.42 (L) 04/08/2014 1738   CALCIUM 9.6 09/17/2016 1450   CALCIUM 9.5 04/08/2014 1738   PROT 7.5 09/17/2016 1450   ALBUMIN 3.9 09/17/2016 1450   AST 15 09/17/2016 1450   ALT 7 (L) 09/17/2016 1450   ALKPHOS 66 09/17/2016 1450   BILITOT 0.4 09/17/2016 1450   GFRNONAA 50 (L) 09/17/2016 1450   GFRNONAA >60 04/08/2014 1738   GFRAA 58 (L) 09/17/2016 1450   GFRAA >60 04/08/2014 1738    No results found for: SPEP, UPEP  Lab Results  Component Value Date   WBC 4.2 09/17/2016   NEUTROABS 1.9 09/17/2016   HGB 9.2 (L) 09/17/2016   HCT 27.9 (L) 09/17/2016   MCV 97.5 09/17/2016   PLT 231 09/17/2016      Chemistry      Component Value Date/Time   NA 138 09/17/2016 1450   NA 133 (L) 04/08/2014 1738   K 4.5 09/17/2016 1450   K 4.2 04/08/2014 1738   CL 104 09/17/2016 1450   CL 99 04/08/2014 1738   CO2 26 09/17/2016 1450   CO2 28 04/08/2014 1738   BUN 19 09/17/2016 1450   BUN 5 (L) 04/08/2014 1738   CREATININE 1.10 (H) 09/17/2016 1450   CREATININE 0.42 (L) 04/08/2014 1738      Component Value Date/Time   CALCIUM 9.6 09/17/2016 1450   CALCIUM 9.5 04/08/2014 1738   ALKPHOS 66 09/17/2016 1450   AST 15 09/17/2016 1450   ALT 7 (L) 09/17/2016 1450   BILITOT 0.4 09/17/2016 1450       RADIOGRAPHIC STUDIES: I have personally reviewed the radiological images as listed and agreed with the findings in the report. No results found.   ASSESSMENT & PLAN:  Carcinoma of overlapping sites of left breast in female, estrogen receptor positive ([place]) # Metastatic breast cancer/lobular ER/PR positive HER-2/neu  negative- on ibrance and Femara . Significant response noted. s/p palliative mastectomy on the left side. Clinically no evidence of progression. OCT 2017- CT NED.  # Continue letrozole + Ibrance.  Labs from today reviewed; Hb 9.5. acceptable for treatment.starting Ibrance again on 1/26th.   # Anemia hemoglobin 9.5. Slowly drifting down. We will check iron studies/ferritin today.  # CKD/ acute kidney injury creat 1.6; improved today; creat 1.1.  Continue lisinopril.  # Left shoulder pain? Bursitis; on percocet BID. New script given.   # Follow up in 4 weeks/labs/MD.    Orders Placed This Encounter  Procedures  . Ferritin    Standing Status:   Future    Number of Occurrences:   1    Standing Expiration Date:   09/17/2017  . Iron and TIBC    Standing Status:   Future    Number of Occurrences:   1    Standing Expiration Date:   09/17/2017  . CBC with Differential    Standing Status:   Future    Standing Expiration Date:   09/17/2017  . Comprehensive metabolic panel    Standing Status:   Future    Standing Expiration Date:   09/17/2017  . Cancer antigen 27.29    Standing Status:   Future    Standing Expiration Date:   09/17/2017       [name], MD 09/18/2016 10:45 AM

## 2016-09-17 NOTE — Progress Notes (Signed)
Patient here today for follow up.   

## 2016-10-15 ENCOUNTER — Inpatient Hospital Stay: Payer: Medicare HMO

## 2016-10-15 ENCOUNTER — Inpatient Hospital Stay: Payer: Medicare HMO | Attending: Internal Medicine | Admitting: Internal Medicine

## 2016-10-15 VITALS — BP 108/68 | HR 99 | Temp 97.9°F | Wt 165.5 lb

## 2016-10-15 DIAGNOSIS — M25512 Pain in left shoulder: Secondary | ICD-10-CM | POA: Insufficient documentation

## 2016-10-15 DIAGNOSIS — K219 Gastro-esophageal reflux disease without esophagitis: Secondary | ICD-10-CM | POA: Insufficient documentation

## 2016-10-15 DIAGNOSIS — J449 Chronic obstructive pulmonary disease, unspecified: Secondary | ICD-10-CM

## 2016-10-15 DIAGNOSIS — Z9013 Acquired absence of bilateral breasts and nipples: Secondary | ICD-10-CM | POA: Insufficient documentation

## 2016-10-15 DIAGNOSIS — C50812 Malignant neoplasm of overlapping sites of left female breast: Secondary | ICD-10-CM

## 2016-10-15 DIAGNOSIS — Z170 Estrogen receptor positive status [ER+]: Secondary | ICD-10-CM | POA: Diagnosis not present

## 2016-10-15 DIAGNOSIS — Z801 Family history of malignant neoplasm of trachea, bronchus and lung: Secondary | ICD-10-CM | POA: Diagnosis not present

## 2016-10-15 DIAGNOSIS — Z7982 Long term (current) use of aspirin: Secondary | ICD-10-CM | POA: Diagnosis not present

## 2016-10-15 DIAGNOSIS — M542 Cervicalgia: Secondary | ICD-10-CM | POA: Diagnosis not present

## 2016-10-15 DIAGNOSIS — I129 Hypertensive chronic kidney disease with stage 1 through stage 4 chronic kidney disease, or unspecified chronic kidney disease: Secondary | ICD-10-CM | POA: Diagnosis not present

## 2016-10-15 DIAGNOSIS — N189 Chronic kidney disease, unspecified: Secondary | ICD-10-CM | POA: Insufficient documentation

## 2016-10-15 DIAGNOSIS — F17210 Nicotine dependence, cigarettes, uncomplicated: Secondary | ICD-10-CM | POA: Insufficient documentation

## 2016-10-15 DIAGNOSIS — Z79899 Other long term (current) drug therapy: Secondary | ICD-10-CM

## 2016-10-15 DIAGNOSIS — F329 Major depressive disorder, single episode, unspecified: Secondary | ICD-10-CM | POA: Diagnosis not present

## 2016-10-15 DIAGNOSIS — L658 Other specified nonscarring hair loss: Secondary | ICD-10-CM | POA: Diagnosis not present

## 2016-10-15 DIAGNOSIS — Z79811 Long term (current) use of aromatase inhibitors: Secondary | ICD-10-CM | POA: Insufficient documentation

## 2016-10-15 DIAGNOSIS — M1990 Unspecified osteoarthritis, unspecified site: Secondary | ICD-10-CM | POA: Insufficient documentation

## 2016-10-15 DIAGNOSIS — Z86000 Personal history of in-situ neoplasm of breast: Secondary | ICD-10-CM | POA: Diagnosis not present

## 2016-10-15 DIAGNOSIS — E1122 Type 2 diabetes mellitus with diabetic chronic kidney disease: Secondary | ICD-10-CM | POA: Insufficient documentation

## 2016-10-15 LAB — CBC WITH DIFFERENTIAL/PLATELET
BASOS PCT: 1 %
Basophils Absolute: 0 10*3/uL (ref 0–0.1)
Eosinophils Absolute: 0.1 10*3/uL (ref 0–0.7)
Eosinophils Relative: 1 %
HEMATOCRIT: 27.6 % — AB (ref 35.0–47.0)
HEMOGLOBIN: 9.3 g/dL — AB (ref 12.0–16.0)
LYMPHS ABS: 1.2 10*3/uL (ref 1.0–3.6)
LYMPHS PCT: 22 %
MCH: 32.7 pg (ref 26.0–34.0)
MCHC: 33.5 g/dL (ref 32.0–36.0)
MCV: 97.5 fL (ref 80.0–100.0)
MONO ABS: 0.2 10*3/uL (ref 0.2–0.9)
Monocytes Relative: 4 %
NEUTROS ABS: 3.9 10*3/uL (ref 1.4–6.5)
NEUTROS PCT: 72 %
Platelets: 289 10*3/uL (ref 150–440)
RBC: 2.83 MIL/uL — ABNORMAL LOW (ref 3.80–5.20)
RDW: 16.8 % — AB (ref 11.5–14.5)
WBC: 5.4 10*3/uL (ref 3.6–11.0)

## 2016-10-15 LAB — COMPREHENSIVE METABOLIC PANEL
ALBUMIN: 4.2 g/dL (ref 3.5–5.0)
ALK PHOS: 68 U/L (ref 38–126)
ALT: 8 U/L — ABNORMAL LOW (ref 14–54)
ANION GAP: 9 (ref 5–15)
AST: 18 U/L (ref 15–41)
BUN: 21 mg/dL — ABNORMAL HIGH (ref 6–20)
CALCIUM: 9.8 mg/dL (ref 8.9–10.3)
CO2: 25 mmol/L (ref 22–32)
Chloride: 104 mmol/L (ref 101–111)
Creatinine, Ser: 1.13 mg/dL — ABNORMAL HIGH (ref 0.44–1.00)
GFR, EST AFRICAN AMERICAN: 57 mL/min — AB (ref >60)
GFR, EST NON AFRICAN AMERICAN: 49 mL/min — AB (ref >60)
GLUCOSE: 101 mg/dL — AB (ref 65–99)
POTASSIUM: 4.6 mmol/L (ref 3.5–5.1)
Sodium: 138 mmol/L (ref 135–145)
TOTAL PROTEIN: 7.9 g/dL (ref 6.5–8.1)
Total Bilirubin: 0.6 mg/dL (ref 0.3–1.2)

## 2016-10-15 MED ORDER — OXYCODONE-ACETAMINOPHEN 5-325 MG PO TABS: 1.0000 | ORAL_TABLET | Freq: Two times a day (BID) | ORAL | 0 refills | Status: DC | PRN

## 2016-10-15 NOTE — Assessment & Plan Note (Addendum)
#  Metastatic breast cancer/lobular ER/PR positive HER-2/neu negative- on ibrance and Femara . Significant response noted. s/p palliative mastectomy on the left side. Clinically no evidence of progression. OCT 2017- CT NED. Will order PET at next visit.   # Continue letrozole + Ibrance.  Labs from today reviewed; Hb 9.5. acceptable for treatment.starting Ibrance again on 2/23 rd. .   # Anemia hemoglobin 9.5. Stable. No IDA.   # CKD/ acute kidney injury creat 1.6; improved today; creat 1.1.  Continue lisinopril.  # Left shoulder pain? Bursitis; on percocet BID. New script given.   # Follow up in paax.  4 weeks/labs/MD.

## 2016-10-15 NOTE — Progress Notes (Signed)
Patient here today for follow up.  Patient states no new concerns today  

## 2016-10-15 NOTE — Progress Notes (Signed)
[place] OFFICE PROGRESS NOTE  Patient Care Team: Donnie Coffin, MD as PCP - General (Family Medicine) Donnie Coffin, MD as Referring Physician (Family Medicine) Seeplaputhur Robinette Haines, MD (General Surgery) Forest Gleason, MD (Oncology)  Cancer of left breast [facility])   Staging form: Breast, AJCC 7th Edition     Clinical: Stage IIIB (T4, N1, cM0(i+)) - Signed by Forest Gleason, MD on 08/09/2015     Pathologic: Stage IV (T4, N1, M1) - Signed by Forest Gleason, MD on 08/16/2015    Oncology History   # DEC 2016-  LEFT BREAST CA- LOBULAR CA ER/PR-Pos; her 2 NEG STAGE IV [[id number]- left breast/bil Ax LN/Media LN/RP LN; skeletal mets]; DEC 2016- LETROZOLE + IBRANCE; April 2017- Significant response to treatment- [Dr.Sankar]; s/p Left mastec & ALND [palliative]; cont Leslee Home + Letrzole  # OCT 5th CT NED.   # RIGHT BREAST CA [s/p mastectomy [facility]]     Carcinoma of overlapping sites of left breast in female, estrogen receptor positive ([place])      INTERVAL HISTORY:  Sophia Nelson 71 y.o.  female pleasant patient above history of Metastatic breast cancer lobular carcinoma currently on ibrance and letrozole- Is here for follow-up.  Patient has been losing her hair. She cut her hair. She is depressed; tearful. She is also on distress- having to take care of her mother has dementia. She continues with Percocet for her neck pain/shoulder pain.  Appetite is good. No weight loss. No nausea no vomiting. No chest pain. No fevers. No bone pain. She has been compliant with her medications. She is currently in the off week of her ibrance. She will start ibrance again on the 24th.  REVIEW OF SYSTEMS:  A complete 10 point review of system is done which is negative except mentioned above/history of present illness.   PAST MEDICAL HISTORY :  Past Medical History:  Diagnosis Date  . Anemia   . Arthritis   . Asthma   . Breast cancer ([place]) 2009   RT MASTECTOMY, DCIS  . Cancer of left breast ([place])  08/09/2015   T4 N1 M1 tumor, INVASIVE LOBULAR CARCINOMA.  [name] COPD (chronic obstructive pulmonary disease) ([place])   . Diabetes mellitus without complication ([place])   . GERD (gastroesophageal reflux disease)   . Headache    h/o migraines as a child  . Hypertension   . Pneumonia 2015  . Shortness of breath dyspnea    with exertion    PAST SURGICAL HISTORY :   Past Surgical History:  Procedure Laterality Date  . ABDOMINAL HYSTERECTOMY    . BREAST SURGERY Right 2015   mastectomy  . SIMPLE MASTECTOMY WITH AXILLARY SENTINEL NODE BIOPSY Left 02/29/2016   Procedure: SIMPLE MASTECTOMY;  Surgeon: Christene Lye, MD;  Location: [facility] ORS;  Service: General;  Laterality: Left;  . TUBAL LIGATION      FAMILY HISTORY :   Family History  Problem Relation Age of Onset  . Lung cancer Father   . Breast cancer Neg Hx     SOCIAL HISTORY:   Social History  Substance Use Topics  . Smoking status: Current Some Day Smoker    Packs/day: 0.25    Years: 15.00    Types: Cigarettes    Last attempt to quit: 02/25/2016  . Smokeless tobacco: Never Used     Comment: currently as of 02-21-16 pt states she is only smoking 2-3 cigarettes per day  . 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  MEDICATIONS:  Current Outpatient Prescriptions  Medication Sig Dispense Refill  . ADVAIR DISKUS 500-50 MCG/DOSE AEPB Inhale 1 puff into the lungs 2 (two) times daily.     [name] albuterol (PROVENTIL HFA;VENTOLIN HFA) 108 (90 Base) MCG/ACT inhaler Inhale 2 puffs into the lungs every 6 (six) hours as needed for wheezing or shortness of breath.    [name] aspirin 81 MG tablet Take 81 mg by mouth daily.    . calcium carbonate (OSCAL) 1500 (600 Ca) MG TABS tablet Take 1,500 mg by mouth 2 (two) times daily with a meal.    . docusate sodium (COLACE) 100 MG capsule Take 100 mg by mouth 2 (two) times daily.     . fluticasone (FLONASE) 50 MCG/ACT nasal spray Place 2 sprays into both nostrils daily.    [name] GLIPIZIDE XL 2.5 MG 24 hr tablet Take 2.5 mg by mouth daily with breakfast.     . letrozole (FEMARA) 2.5 MG tablet TAKE 1 TABLET (2.5 MG TOTAL) BY MOUTH DAILY. 90 tablet PRN  . lisinopril (PRINIVIL,ZESTRIL) 40 MG tablet Take 40 mg by mouth every morning.     . montelukast (SINGULAIR) 10 MG tablet Take 10 mg by mouth at bedtime.    [name] omeprazole (PRILOSEC) 20 MG capsule Take 20 mg by mouth every morning.     [name] oxyCODONE-acetaminophen (ROXICET) 5-325 MG tablet Take 1 tablet by mouth every 12 (twelve) hours as needed for severe pain. 60 tablet 0  . OXYGEN Inhale 2 L into the lungs continuous.     . palbociclib (IBRANCE) 125 MG capsule Take 125 mg by mouth daily with lunch.     . SPIRIVA HANDIHALER 18 MCG inhalation capsule Place 18 mcg into inhaler and inhale every morning.      No current facility-administered medications for this visit.     PHYSICAL EXAMINATION: ECOG PERFORMANCE STATUS: 0 - Asymptomatic  BP 108/68 (BP Location: Right Arm, Patient Position: Sitting)   Pulse 99   Temp 97.9 F (36.6 C) (Tympanic)   Wt 165 lb 8 oz (75.1 kg)   SpO2 97% Comment: on 2 liter of oxygen   BMI 30.27 kg/m   Filed Weights   10/15/16 1451  Weight: 165 lb 8 oz (75.1 kg)    GENERAL: Well-nourished well-developed; Alert, no distress and comfortable.   Alone.  EYES: no pallor or icterus OROPHARYNX: no thrush or ulceration;poor dentition.   NECK: supple, no masses felt LYMPH:  no palpable lymphadenopathy in the cervical, axillary or inguinal regions LUNGS: clear to auscultation and  No wheeze or crackles HEART/CVS: regular rate & rhythm and no murmurs; No lower extremity edema ABDOMEN:abdomen soft, non-tender and normal bowel sounds Musculoskeletal:no cyanosis of digits and no clubbing  PSYCH: alert & oriented x 3 with fluent speech NEURO: no focal motor/sensory deficits SKIN:  no rashes or significant  lesions   LABORATORY DATA:  I have reviewed the data as listed    Component Value Date/Time   NA 138 10/15/2016 1420   NA 133 (L) 04/08/2014 1738   K 4.6 10/15/2016 1420   K 4.2 04/08/2014 1738   CL 104 10/15/2016 1420   CL 99 04/08/2014 1738   CO2 25 10/15/2016 1420   CO2 28 04/08/2014 1738   GLUCOSE 101 (H) 10/15/2016 1420   GLUCOSE 116 (H) 04/08/2014 1738   BUN  21 (H) 10/15/2016 1420   BUN 5 (L) 04/08/2014 1738   CREATININE 1.13 (H) 10/15/2016 1420   CREATININE 0.42 (L) 04/08/2014 1738   CALCIUM 9.8 10/15/2016 1420   CALCIUM 9.5 04/08/2014 1738   PROT 7.9 10/15/2016 1420   ALBUMIN 4.2 10/15/2016 1420   AST 18 10/15/2016 1420   ALT 8 (L) 10/15/2016 1420   ALKPHOS 68 10/15/2016 1420   BILITOT 0.6 10/15/2016 1420   GFRNONAA 49 (L) 10/15/2016 1420   GFRNONAA >60 04/08/2014 1738   GFRAA 57 (L) 10/15/2016 1420   GFRAA >60 04/08/2014 1738    No results found for: SPEP, UPEP  Lab Results  Component Value Date   WBC 5.4 10/15/2016   NEUTROABS 3.9 10/15/2016   HGB 9.3 (L) 10/15/2016   HCT 27.6 (L) 10/15/2016   MCV 97.5 10/15/2016   PLT 289 10/15/2016      Chemistry      Component Value Date/Time   NA 138 10/15/2016 1420   NA 133 (L) 04/08/2014 1738   K 4.6 10/15/2016 1420   K 4.2 04/08/2014 1738   CL 104 10/15/2016 1420   CL 99 04/08/2014 1738   CO2 25 10/15/2016 1420   CO2 28 04/08/2014 1738   BUN 21 (H) 10/15/2016 1420   BUN 5 (L) 04/08/2014 1738   CREATININE 1.13 (H) 10/15/2016 1420   CREATININE 0.42 (L) 04/08/2014 1738      Component Value Date/Time   CALCIUM 9.8 10/15/2016 1420   CALCIUM 9.5 04/08/2014 1738   ALKPHOS 68 10/15/2016 1420   AST 18 10/15/2016 1420   ALT 8 (L) 10/15/2016 1420   BILITOT 0.6 10/15/2016 1420       RADIOGRAPHIC STUDIES: I have personally reviewed the radiological images as listed and agreed with the findings in the report. No results found.   ASSESSMENT & PLAN:  Carcinoma of overlapping sites of left breast in  female, estrogen receptor positive ([place]) # Metastatic breast cancer/lobular ER/PR positive HER-2/neu negative- on ibrance and Femara . Significant response noted. s/p palliative mastectomy on the left side. Clinically no evidence of progression. OCT 2017- CT NED. Will order PET at next visit.   # Continue letrozole + Ibrance.  Labs from today reviewed; Hb 9.5. acceptable for treatment.starting Ibrance again on 2/23 rd. .   # Anemia hemoglobin 9.5. Stable. No IDA.   # CKD/ acute kidney injury creat 1.6; improved today; creat 1.1.  Continue lisinopril.  # Left shoulder pain? Bursitis; on percocet BID. New script given.   # Follow up in paax.  4 weeks/labs/MD.    Orders Placed This Encounter  Procedures  . Comprehensive metabolic panel    Standing Status:   Future    Standing Expiration Date:   10/15/2017  . CBC with Differential    Standing Status:   Future    Standing Expiration Date:   10/15/2017  . Cancer antigen 27.29    Standing Status:   Future    Standing Expiration Date:   10/15/2017       [name], MD 10/15/2016 3:57 PM

## 2016-10-16 LAB — CANCER ANTIGEN 27.29: CA 27.29: 25.4 U/mL (ref 0.0–38.6)

## 2016-11-01 ENCOUNTER — Emergency Department
Admission: EM | Admit: 2016-11-01 | Discharge: 2016-11-02 | Disposition: A | Discharge status: Home / Self Care | Payer: No Typology Code available for payment source | Attending: Emergency Medicine | Admitting: Emergency Medicine

## 2016-11-01 ENCOUNTER — Other Ambulatory Visit: Payer: Self-pay | Admitting: *Deleted

## 2016-11-01 DIAGNOSIS — I10 Essential (primary) hypertension: Secondary | ICD-10-CM | POA: Diagnosis not present

## 2016-11-01 DIAGNOSIS — Z170 Estrogen receptor positive status [ER+]: Principal | ICD-10-CM

## 2016-11-01 DIAGNOSIS — Y92410 Unspecified street and highway as the place of occurrence of the external cause: Secondary | ICD-10-CM | POA: Diagnosis not present

## 2016-11-01 DIAGNOSIS — R1084 Generalized abdominal pain: Secondary | ICD-10-CM | POA: Insufficient documentation

## 2016-11-01 DIAGNOSIS — G9389 Other specified disorders of brain: Secondary | ICD-10-CM | POA: Insufficient documentation

## 2016-11-01 DIAGNOSIS — Z79899 Other long term (current) drug therapy: Secondary | ICD-10-CM | POA: Diagnosis not present

## 2016-11-01 DIAGNOSIS — S52572A Other intraarticular fracture of lower end of left radius, initial encounter for closed fracture: Secondary | ICD-10-CM | POA: Insufficient documentation

## 2016-11-01 DIAGNOSIS — C50812 Malignant neoplasm of overlapping sites of left female breast: Secondary | ICD-10-CM

## 2016-11-01 DIAGNOSIS — E119 Type 2 diabetes mellitus without complications: Secondary | ICD-10-CM | POA: Insufficient documentation

## 2016-11-01 DIAGNOSIS — Z7984 Long term (current) use of oral hypoglycemic drugs: Secondary | ICD-10-CM | POA: Insufficient documentation

## 2016-11-01 DIAGNOSIS — Z853 Personal history of malignant neoplasm of breast: Secondary | ICD-10-CM | POA: Insufficient documentation

## 2016-11-01 DIAGNOSIS — F17210 Nicotine dependence, cigarettes, uncomplicated: Secondary | ICD-10-CM | POA: Diagnosis not present

## 2016-11-01 DIAGNOSIS — S6992XA Unspecified injury of left wrist, hand and finger(s), initial encounter: Secondary | ICD-10-CM | POA: Diagnosis present

## 2016-11-01 DIAGNOSIS — Y999 Unspecified external cause status: Secondary | ICD-10-CM | POA: Insufficient documentation

## 2016-11-01 DIAGNOSIS — Z7982 Long term (current) use of aspirin: Secondary | ICD-10-CM | POA: Insufficient documentation

## 2016-11-01 DIAGNOSIS — M5412 Radiculopathy, cervical region: Secondary | ICD-10-CM

## 2016-11-01 DIAGNOSIS — Y9389 Activity, other specified: Secondary | ICD-10-CM | POA: Diagnosis not present

## 2016-11-01 DIAGNOSIS — J449 Chronic obstructive pulmonary disease, unspecified: Secondary | ICD-10-CM | POA: Diagnosis not present

## 2016-11-01 DIAGNOSIS — J45909 Unspecified asthma, uncomplicated: Secondary | ICD-10-CM | POA: Diagnosis not present

## 2016-11-01 DIAGNOSIS — S62102A Fracture of unspecified carpal bone, left wrist, initial encounter for closed fracture: Secondary | ICD-10-CM

## 2016-11-01 MED ORDER — LETROZOLE 2.5 MG PO TABS
2.5000 mg | ORAL_TABLET | Freq: Every day | ORAL | 4 refills | Status: DC
Start: 2016-11-01 — End: 2017-03-28

## 2016-11-01 NOTE — Progress Notes (Signed)
RF request from [employer] on letrozole. Approved by md.
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
## 2016-11-02 ENCOUNTER — Emergency Department: Payer: No Typology Code available for payment source

## 2016-11-02 ENCOUNTER — Encounter: Payer: Self-pay | Admitting: Radiology

## 2016-11-02 DIAGNOSIS — S52572A Other intraarticular fracture of lower end of left radius, initial encounter for closed fracture: Secondary | ICD-10-CM | POA: Diagnosis not present

## 2016-11-02 LAB — COMPREHENSIVE METABOLIC PANEL
ALT: 16 U/L (ref 14–54)
ANION GAP: 7 (ref 5–15)
AST: 51 U/L — ABNORMAL HIGH (ref 15–41)
Albumin: 3.9 g/dL (ref 3.5–5.0)
Alkaline Phosphatase: 67 U/L (ref 38–126)
BILIRUBIN TOTAL: 0.4 mg/dL (ref 0.3–1.2)
BUN: 25 mg/dL — AB (ref 6–20)
CO2: 28 mmol/L (ref 22–32)
Calcium: 9.2 mg/dL (ref 8.9–10.3)
Chloride: 103 mmol/L (ref 101–111)
Creatinine, Ser: 1.21 mg/dL — ABNORMAL HIGH (ref 0.44–1.00)
GFR, EST AFRICAN AMERICAN: 52 mL/min — AB (ref >60)
GFR, EST NON AFRICAN AMERICAN: 45 mL/min — AB (ref >60)
Glucose, Bld: 152 mg/dL — ABNORMAL HIGH (ref 65–99)
POTASSIUM: 4.4 mmol/L (ref 3.5–5.1)
Sodium: 138 mmol/L (ref 135–145)
TOTAL PROTEIN: 7.7 g/dL (ref 6.5–8.1)

## 2016-11-02 LAB — CBC
HCT: 27.7 % — ABNORMAL LOW (ref 35.0–47.0)
Hemoglobin: 9.1 g/dL — ABNORMAL LOW (ref 12.0–16.0)
MCH: 32.1 pg (ref 26.0–34.0)
MCHC: 33 g/dL (ref 32.0–36.0)
MCV: 97.2 fL (ref 80.0–100.0)
Platelets: 627 10*3/uL — ABNORMAL HIGH (ref 150–440)
RBC: 2.85 MIL/uL — ABNORMAL LOW (ref 3.80–5.20)
RDW: 16.9 % — AB (ref 11.5–14.5)
WBC: 8.5 10*3/uL (ref 3.6–11.0)

## 2016-11-02 LAB — TROPONIN I: Troponin I: <0.03 ng/mL (ref <0.03)

## 2016-11-02 MED ORDER — ONDANSETRON HCL 4 MG/2ML IJ SOLN: 4.0000 mg | Freq: Once | INTRAMUSCULAR | Status: DC

## 2016-11-02 MED ORDER — IOPAMIDOL (ISOVUE-300) INJECTION 61%
100.0000 mL | Freq: Once | INTRAVENOUS | Status: AC | PRN
  Administered 2016-11-02: 100 mL via INTRAVENOUS

## 2016-11-02 MED ORDER — MORPHINE SULFATE (PF) 2 MG/ML IV SOLN: 2.0000 mg | Freq: Once | INTRAVENOUS | Status: DC

## 2016-11-02 MED ORDER — MORPHINE SULFATE (PF) 2 MG/ML IV SOLN
INTRAVENOUS | Status: AC
  Filled 2016-11-02: qty 1

## 2016-11-02 MED ORDER — ONDANSETRON HCL 4 MG/2ML IJ SOLN
4.0000 mg | Freq: Once | INTRAMUSCULAR | Status: AC
  Administered 2016-11-02: 4 mg via INTRAVENOUS

## 2016-11-02 MED ORDER — MORPHINE SULFATE (PF) 4 MG/ML IV SOLN
4.0000 mg | Freq: Once | INTRAVENOUS | Status: AC
  Administered 2016-11-02: 4 mg via INTRAVENOUS
  Filled 2016-11-02: qty 1

## 2016-11-02 MED ORDER — ONDANSETRON HCL 4 MG/2ML IJ SOLN
INTRAMUSCULAR | Status: AC
  Filled 2016-11-02: qty 2

## 2016-11-02 MED ORDER — ONDANSETRON 4 MG PO TBDP: 4.0000 mg | ORAL_TABLET | Freq: Three times a day (TID) | ORAL | 0 refills | Status: DC | PRN

## 2016-11-02 MED ORDER — OXYCODONE-ACETAMINOPHEN 5-325 MG PO TABS: 1.0000 | ORAL_TABLET | ORAL | 0 refills | Status: DC | PRN

## 2016-11-02 MED ORDER — MORPHINE SULFATE (PF) 2 MG/ML IV SOLN
2.0000 mg | Freq: Once | INTRAVENOUS | Status: AC
  Administered 2016-11-02: 2 mg via INTRAVENOUS

## 2016-11-02 NOTE — ED Notes (Signed)
Patient transported to CT 

## 2016-11-02 NOTE — ED Notes (Signed)
MD Brown at bedside.
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
## 2016-11-02 NOTE — ED Notes (Signed)
Pt was 91% on 2 L, Dr. Owens Shark wanted pt bumped up to 4 L nasal cannula.

## 2016-11-07 ENCOUNTER — Telehealth: Payer: Self-pay | Admitting: *Deleted

## 2016-11-07 NOTE — Telephone Encounter (Signed)
Was sent electronically 3//18 and confirmation received, but [facility] states they did not receive it. Verbal given for Rx. Pharmacist stated that it may be cheaper for the patient to get the Letrozole at a local pharmacy since they are a specialty pharmacy next time

## 2016-11-12 ENCOUNTER — Inpatient Hospital Stay: Payer: Medicare HMO

## 2016-11-12 ENCOUNTER — Inpatient Hospital Stay: Payer: Medicare HMO | Attending: Internal Medicine | Admitting: Internal Medicine

## 2016-11-12 VITALS — BP 104/66 | HR 93 | Temp 97.7°F | Resp 18 | Wt 166.6 lb

## 2016-11-12 DIAGNOSIS — D6481 Anemia due to antineoplastic chemotherapy: Secondary | ICD-10-CM | POA: Diagnosis not present

## 2016-11-12 DIAGNOSIS — Z170 Estrogen receptor positive status [ER+]: Secondary | ICD-10-CM | POA: Insufficient documentation

## 2016-11-12 DIAGNOSIS — Z7982 Long term (current) use of aspirin: Secondary | ICD-10-CM | POA: Diagnosis not present

## 2016-11-12 DIAGNOSIS — C50812 Malignant neoplasm of overlapping sites of left female breast: Secondary | ICD-10-CM

## 2016-11-12 DIAGNOSIS — Z86000 Personal history of in-situ neoplasm of breast: Secondary | ICD-10-CM | POA: Insufficient documentation

## 2016-11-12 DIAGNOSIS — M542 Cervicalgia: Secondary | ICD-10-CM

## 2016-11-12 DIAGNOSIS — T451X5S Adverse effect of antineoplastic and immunosuppressive drugs, sequela: Secondary | ICD-10-CM | POA: Diagnosis not present

## 2016-11-12 DIAGNOSIS — I129 Hypertensive chronic kidney disease with stage 1 through stage 4 chronic kidney disease, or unspecified chronic kidney disease: Secondary | ICD-10-CM | POA: Diagnosis not present

## 2016-11-12 DIAGNOSIS — Z79811 Long term (current) use of aromatase inhibitors: Secondary | ICD-10-CM | POA: Insufficient documentation

## 2016-11-12 DIAGNOSIS — M25512 Pain in left shoulder: Secondary | ICD-10-CM | POA: Diagnosis not present

## 2016-11-12 DIAGNOSIS — J449 Chronic obstructive pulmonary disease, unspecified: Secondary | ICD-10-CM | POA: Diagnosis not present

## 2016-11-12 DIAGNOSIS — Z801 Family history of malignant neoplasm of trachea, bronchus and lung: Secondary | ICD-10-CM | POA: Diagnosis not present

## 2016-11-12 DIAGNOSIS — F1021 Alcohol dependence, in remission: Secondary | ICD-10-CM | POA: Diagnosis not present

## 2016-11-12 DIAGNOSIS — Z79899 Other long term (current) drug therapy: Secondary | ICD-10-CM | POA: Insufficient documentation

## 2016-11-12 DIAGNOSIS — Z9013 Acquired absence of bilateral breasts and nipples: Secondary | ICD-10-CM | POA: Insufficient documentation

## 2016-11-12 DIAGNOSIS — T148XXS Other injury of unspecified body region, sequela: Secondary | ICD-10-CM | POA: Insufficient documentation

## 2016-11-12 DIAGNOSIS — S6292XS Unspecified fracture of left wrist and hand, sequela: Secondary | ICD-10-CM

## 2016-11-12 DIAGNOSIS — F17210 Nicotine dependence, cigarettes, uncomplicated: Secondary | ICD-10-CM | POA: Insufficient documentation

## 2016-11-12 DIAGNOSIS — K219 Gastro-esophageal reflux disease without esophagitis: Secondary | ICD-10-CM

## 2016-11-12 DIAGNOSIS — E1122 Type 2 diabetes mellitus with diabetic chronic kidney disease: Secondary | ICD-10-CM | POA: Insufficient documentation

## 2016-11-12 DIAGNOSIS — N189 Chronic kidney disease, unspecified: Secondary | ICD-10-CM | POA: Insufficient documentation

## 2016-11-12 LAB — CBC WITH DIFFERENTIAL/PLATELET
BASOS ABS: 0 10*3/uL (ref 0–0.1)
BASOS PCT: 0 %
EOS ABS: 0.1 10*3/uL (ref 0–0.7)
EOS PCT: 2 %
HCT: 24.7 % — ABNORMAL LOW (ref 35.0–47.0)
Hemoglobin: 8.3 g/dL — ABNORMAL LOW (ref 12.0–16.0)
Lymphocytes Relative: 28 %
Lymphs Abs: 1.1 10*3/uL (ref 1.0–3.6)
MCH: 32.8 pg (ref 26.0–34.0)
MCHC: 33.6 g/dL (ref 32.0–36.0)
MCV: 97.7 fL (ref 80.0–100.0)
Monocytes Absolute: 0.2 10*3/uL (ref 0.2–0.9)
Monocytes Relative: 6 %
Neutro Abs: 2.6 10*3/uL (ref 1.4–6.5)
Neutrophils Relative %: 64 %
PLATELETS: 358 10*3/uL (ref 150–440)
RBC: 2.53 MIL/uL — AB (ref 3.80–5.20)
RDW: 16.2 % — AB (ref 11.5–14.5)
WBC: 4.1 10*3/uL (ref 3.6–11.0)

## 2016-11-12 LAB — COMPREHENSIVE METABOLIC PANEL
ALK PHOS: 65 U/L (ref 38–126)
ALT: 9 U/L — ABNORMAL LOW (ref 14–54)
AST: 19 U/L (ref 15–41)
Albumin: 4 g/dL (ref 3.5–5.0)
Anion gap: 9 (ref 5–15)
BILIRUBIN TOTAL: 0.5 mg/dL (ref 0.3–1.2)
BUN: 16 mg/dL (ref 6–20)
CALCIUM: 9.7 mg/dL (ref 8.9–10.3)
CO2: 25 mmol/L (ref 22–32)
Chloride: 102 mmol/L (ref 101–111)
Creatinine, Ser: 1.19 mg/dL — ABNORMAL HIGH (ref 0.44–1.00)
GFR calc Af Amer: 53 mL/min — ABNORMAL LOW (ref >60)
GFR, EST NON AFRICAN AMERICAN: 46 mL/min — AB (ref >60)
Glucose, Bld: 108 mg/dL — ABNORMAL HIGH (ref 65–99)
POTASSIUM: 4 mmol/L (ref 3.5–5.1)
Sodium: 136 mmol/L (ref 135–145)
TOTAL PROTEIN: 8 g/dL (ref 6.5–8.1)
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
## 2016-11-12 NOTE — Progress Notes (Signed)
Patient here today for follow up.   

## 2016-11-13 LAB — CANCER ANTIGEN 27.29: CA 27.29: 33.3 U/mL (ref 0.0–38.6)

## 2016-12-12 ENCOUNTER — Inpatient Hospital Stay: Payer: Medicare HMO | Attending: Internal Medicine | Admitting: Internal Medicine

## 2016-12-12 ENCOUNTER — Inpatient Hospital Stay: Payer: Medicare HMO

## 2016-12-12 VITALS — BP 110/70 | HR 94 | Temp 98.3°F | Wt 156.2 lb

## 2016-12-12 DIAGNOSIS — Z9013 Acquired absence of bilateral breasts and nipples: Secondary | ICD-10-CM | POA: Diagnosis not present

## 2016-12-12 DIAGNOSIS — T148XXS Other injury of unspecified body region, sequela: Secondary | ICD-10-CM | POA: Insufficient documentation

## 2016-12-12 DIAGNOSIS — Z7982 Long term (current) use of aspirin: Secondary | ICD-10-CM | POA: Diagnosis not present

## 2016-12-12 DIAGNOSIS — Z86000 Personal history of in-situ neoplasm of breast: Secondary | ICD-10-CM | POA: Insufficient documentation

## 2016-12-12 DIAGNOSIS — J449 Chronic obstructive pulmonary disease, unspecified: Secondary | ICD-10-CM

## 2016-12-12 DIAGNOSIS — M25512 Pain in left shoulder: Secondary | ICD-10-CM | POA: Insufficient documentation

## 2016-12-12 DIAGNOSIS — M791 Myalgia: Secondary | ICD-10-CM | POA: Diagnosis not present

## 2016-12-12 DIAGNOSIS — F17210 Nicotine dependence, cigarettes, uncomplicated: Secondary | ICD-10-CM | POA: Insufficient documentation

## 2016-12-12 DIAGNOSIS — C50812 Malignant neoplasm of overlapping sites of left female breast: Secondary | ICD-10-CM | POA: Insufficient documentation

## 2016-12-12 DIAGNOSIS — Z170 Estrogen receptor positive status [ER+]: Secondary | ICD-10-CM | POA: Diagnosis not present

## 2016-12-12 DIAGNOSIS — D649 Anemia, unspecified: Secondary | ICD-10-CM | POA: Diagnosis not present

## 2016-12-12 DIAGNOSIS — Z79899 Other long term (current) drug therapy: Secondary | ICD-10-CM | POA: Diagnosis not present

## 2016-12-12 DIAGNOSIS — K219 Gastro-esophageal reflux disease without esophagitis: Secondary | ICD-10-CM | POA: Insufficient documentation

## 2016-12-12 DIAGNOSIS — Z79811 Long term (current) use of aromatase inhibitors: Secondary | ICD-10-CM | POA: Diagnosis not present

## 2016-12-12 DIAGNOSIS — E119 Type 2 diabetes mellitus without complications: Secondary | ICD-10-CM | POA: Insufficient documentation

## 2016-12-12 LAB — CBC WITH DIFFERENTIAL/PLATELET
BASOS ABS: 0 10*3/uL (ref 0–0.1)
BASOS PCT: 0 %
EOS PCT: 4 %
Eosinophils Absolute: 0.4 10*3/uL (ref 0–0.7)
HCT: 28.8 % — ABNORMAL LOW (ref 35.0–47.0)
Hemoglobin: 9.6 g/dL — ABNORMAL LOW (ref 12.0–16.0)
Lymphocytes Relative: 25 %
Lymphs Abs: 2.2 10*3/uL (ref 1.0–3.6)
MCH: 30.5 pg (ref 26.0–34.0)
MCHC: 33.1 g/dL (ref 32.0–36.0)
MCV: 92 fL (ref 80.0–100.0)
MONO ABS: 0.7 10*3/uL (ref 0.2–0.9)
Monocytes Relative: 8 %
Neutro Abs: 5.5 10*3/uL (ref 1.4–6.5)
Neutrophils Relative %: 63 %
PLATELETS: 461 10*3/uL — AB (ref 150–440)
RBC: 3.13 MIL/uL — ABNORMAL LOW (ref 3.80–5.20)
RDW: 15.9 % — AB (ref 11.5–14.5)
WBC: 8.8 10*3/uL (ref 3.6–11.0)

## 2016-12-12 LAB — COMPREHENSIVE METABOLIC PANEL
ALBUMIN: 4.1 g/dL (ref 3.5–5.0)
ALT: 8 U/L — ABNORMAL LOW (ref 14–54)
AST: 20 U/L (ref 15–41)
Alkaline Phosphatase: 86 U/L (ref 38–126)
Anion gap: 8 (ref 5–15)
BUN: 11 mg/dL (ref 6–20)
CHLORIDE: 101 mmol/L (ref 101–111)
CO2: 27 mmol/L (ref 22–32)
Calcium: 9.8 mg/dL (ref 8.9–10.3)
Creatinine, Ser: 0.81 mg/dL (ref 0.44–1.00)
GFR calc Af Amer: >60 mL/min (ref >60)
Glucose, Bld: 122 mg/dL — ABNORMAL HIGH (ref 65–99)
POTASSIUM: 4 mmol/L (ref 3.5–5.1)
Sodium: 136 mmol/L (ref 135–145)
Total Bilirubin: 0.5 mg/dL (ref 0.3–1.2)
Total Protein: 7.6 g/dL (ref 6.5–8.1)

## 2016-12-12 MED ORDER — OXYCODONE-ACETAMINOPHEN 5-325 MG PO TABS: 1.0000 | ORAL_TABLET | Freq: Two times a day (BID) | ORAL | 0 refills | Status: DC | PRN
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
## 2016-12-12 NOTE — Progress Notes (Signed)
Patient here today for follow up.   
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
## 2017-01-09 ENCOUNTER — Inpatient Hospital Stay: Payer: Medicare HMO | Attending: Internal Medicine

## 2017-01-09 ENCOUNTER — Inpatient Hospital Stay (HOSPITAL_BASED_OUTPATIENT_CLINIC_OR_DEPARTMENT_OTHER): Payer: Medicare HMO | Admitting: Internal Medicine

## 2017-01-09 DIAGNOSIS — Z170 Estrogen receptor positive status [ER+]: Secondary | ICD-10-CM

## 2017-01-09 DIAGNOSIS — D649 Anemia, unspecified: Secondary | ICD-10-CM | POA: Insufficient documentation

## 2017-01-09 DIAGNOSIS — Z7982 Long term (current) use of aspirin: Secondary | ICD-10-CM

## 2017-01-09 DIAGNOSIS — Z9013 Acquired absence of bilateral breasts and nipples: Secondary | ICD-10-CM | POA: Insufficient documentation

## 2017-01-09 DIAGNOSIS — G8929 Other chronic pain: Secondary | ICD-10-CM

## 2017-01-09 DIAGNOSIS — Z86000 Personal history of in-situ neoplasm of breast: Secondary | ICD-10-CM | POA: Diagnosis not present

## 2017-01-09 DIAGNOSIS — F17210 Nicotine dependence, cigarettes, uncomplicated: Secondary | ICD-10-CM

## 2017-01-09 DIAGNOSIS — C50812 Malignant neoplasm of overlapping sites of left female breast: Secondary | ICD-10-CM | POA: Insufficient documentation

## 2017-01-09 DIAGNOSIS — E119 Type 2 diabetes mellitus without complications: Secondary | ICD-10-CM

## 2017-01-09 DIAGNOSIS — K219 Gastro-esophageal reflux disease without esophagitis: Secondary | ICD-10-CM

## 2017-01-09 DIAGNOSIS — Z79899 Other long term (current) drug therapy: Secondary | ICD-10-CM | POA: Diagnosis not present

## 2017-01-09 DIAGNOSIS — I10 Essential (primary) hypertension: Secondary | ICD-10-CM

## 2017-01-09 DIAGNOSIS — J449 Chronic obstructive pulmonary disease, unspecified: Secondary | ICD-10-CM | POA: Insufficient documentation

## 2017-01-09 DIAGNOSIS — M542 Cervicalgia: Secondary | ICD-10-CM | POA: Insufficient documentation

## 2017-01-09 DIAGNOSIS — M25511 Pain in right shoulder: Secondary | ICD-10-CM

## 2017-01-09 DIAGNOSIS — Z801 Family history of malignant neoplasm of trachea, bronchus and lung: Secondary | ICD-10-CM | POA: Insufficient documentation

## 2017-01-09 DIAGNOSIS — Z79811 Long term (current) use of aromatase inhibitors: Secondary | ICD-10-CM

## 2017-01-09 LAB — COMPREHENSIVE METABOLIC PANEL
ALBUMIN: 4.1 g/dL (ref 3.5–5.0)
ALK PHOS: 88 U/L (ref 38–126)
ALT: 7 U/L — ABNORMAL LOW (ref 14–54)
ANION GAP: 9 (ref 5–15)
AST: 18 U/L (ref 15–41)
BILIRUBIN TOTAL: 0.4 mg/dL (ref 0.3–1.2)
BUN: 22 mg/dL — AB (ref 6–20)
CO2: 25 mmol/L (ref 22–32)
Calcium: 9.8 mg/dL (ref 8.9–10.3)
Chloride: 102 mmol/L (ref 101–111)
Creatinine, Ser: 0.84 mg/dL (ref 0.44–1.00)
GFR calc Af Amer: >60 mL/min (ref >60)
GFR calc non Af Amer: >60 mL/min (ref >60)
Glucose, Bld: 83 mg/dL (ref 65–99)
POTASSIUM: 4.2 mmol/L (ref 3.5–5.1)
SODIUM: 136 mmol/L (ref 135–145)
TOTAL PROTEIN: 7.9 g/dL (ref 6.5–8.1)

## 2017-01-09 LAB — CBC WITH DIFFERENTIAL/PLATELET
BASOS ABS: 0.1 10*3/uL (ref 0–0.1)
Basophils Relative: 1 %
Eosinophils Absolute: 0.1 10*3/uL (ref 0–0.7)
Eosinophils Relative: 2 %
HEMATOCRIT: 30.6 % — AB (ref 35.0–47.0)
Hemoglobin: 10.2 g/dL — ABNORMAL LOW (ref 12.0–16.0)
LYMPHS PCT: 24 %
Lymphs Abs: 2 10*3/uL (ref 1.0–3.6)
MCH: 29 pg (ref 26.0–34.0)
MCHC: 33.3 g/dL (ref 32.0–36.0)
MCV: 87 fL (ref 80.0–100.0)
MONO ABS: 0.6 10*3/uL (ref 0.2–0.9)
Monocytes Relative: 7 %
NEUTROS ABS: 5.4 10*3/uL (ref 1.4–6.5)
Neutrophils Relative %: 66 %
PLATELETS: 389 10*3/uL (ref 150–440)
RBC: 3.52 MIL/uL — ABNORMAL LOW (ref 3.80–5.20)
RDW: 16.4 % — AB (ref 11.5–14.5)
WBC: 8.2 10*3/uL (ref 3.6–11.0)

## 2017-01-09 MED ORDER — OXYCODONE-ACETAMINOPHEN 5-325 MG PO TABS: 1.0000 | ORAL_TABLET | Freq: Two times a day (BID) | ORAL | 0 refills | Status: DC | PRN
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
## 2017-01-09 NOTE — Progress Notes (Signed)
Patient here today for follow up.  Patient states no new concerns today  
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
## 2017-02-06 ENCOUNTER — Ambulatory Visit: Payer: Medicare HMO | Admitting: Oncology

## 2017-02-06 ENCOUNTER — Other Ambulatory Visit: Payer: Medicare HMO

## 2017-02-13 ENCOUNTER — Inpatient Hospital Stay (HOSPITAL_BASED_OUTPATIENT_CLINIC_OR_DEPARTMENT_OTHER): Payer: Medicare HMO | Admitting: Oncology

## 2017-02-13 ENCOUNTER — Inpatient Hospital Stay: Payer: Medicare HMO | Attending: Oncology

## 2017-02-13 VITALS — BP 133/84 | HR 88 | Temp 97.0°F | Resp 20 | Wt 152.1 lb

## 2017-02-13 DIAGNOSIS — Z9011 Acquired absence of right breast and nipple: Secondary | ICD-10-CM | POA: Insufficient documentation

## 2017-02-13 DIAGNOSIS — M25512 Pain in left shoulder: Secondary | ICD-10-CM | POA: Insufficient documentation

## 2017-02-13 DIAGNOSIS — F17210 Nicotine dependence, cigarettes, uncomplicated: Secondary | ICD-10-CM

## 2017-02-13 DIAGNOSIS — C50812 Malignant neoplasm of overlapping sites of left female breast: Secondary | ICD-10-CM | POA: Diagnosis not present

## 2017-02-13 DIAGNOSIS — Z7982 Long term (current) use of aspirin: Secondary | ICD-10-CM | POA: Insufficient documentation

## 2017-02-13 DIAGNOSIS — Z9012 Acquired absence of left breast and nipple: Secondary | ICD-10-CM | POA: Insufficient documentation

## 2017-02-13 DIAGNOSIS — D649 Anemia, unspecified: Secondary | ICD-10-CM | POA: Diagnosis not present

## 2017-02-13 DIAGNOSIS — Z853 Personal history of malignant neoplasm of breast: Secondary | ICD-10-CM

## 2017-02-13 DIAGNOSIS — E119 Type 2 diabetes mellitus without complications: Secondary | ICD-10-CM | POA: Insufficient documentation

## 2017-02-13 DIAGNOSIS — J449 Chronic obstructive pulmonary disease, unspecified: Secondary | ICD-10-CM | POA: Diagnosis not present

## 2017-02-13 DIAGNOSIS — Z79899 Other long term (current) drug therapy: Secondary | ICD-10-CM | POA: Diagnosis not present

## 2017-02-13 DIAGNOSIS — K219 Gastro-esophageal reflux disease without esophagitis: Secondary | ICD-10-CM | POA: Diagnosis not present

## 2017-02-13 DIAGNOSIS — Z170 Estrogen receptor positive status [ER+]: Secondary | ICD-10-CM | POA: Diagnosis not present

## 2017-02-13 DIAGNOSIS — I10 Essential (primary) hypertension: Secondary | ICD-10-CM | POA: Diagnosis not present

## 2017-02-13 LAB — CBC WITH DIFFERENTIAL/PLATELET
Basophils Absolute: 0 10*3/uL (ref 0–0.1)
Basophils Relative: 0 %
EOS PCT: 1 %
Eosinophils Absolute: 0.1 10*3/uL (ref 0–0.7)
HCT: 28.6 % — ABNORMAL LOW (ref 35.0–47.0)
Hemoglobin: 9.5 g/dL — ABNORMAL LOW (ref 12.0–16.0)
LYMPHS ABS: 2.3 10*3/uL (ref 1.0–3.6)
LYMPHS PCT: 31 %
MCH: 27.8 pg (ref 26.0–34.0)
MCHC: 33 g/dL (ref 32.0–36.0)
MCV: 84.2 fL (ref 80.0–100.0)
MONO ABS: 0.8 10*3/uL (ref 0.2–0.9)
Monocytes Relative: 10 %
Neutro Abs: 4.2 10*3/uL (ref 1.4–6.5)
Neutrophils Relative %: 58 %
PLATELETS: 466 10*3/uL — AB (ref 150–440)
RBC: 3.4 MIL/uL — AB (ref 3.80–5.20)
RDW: 18.4 % — ABNORMAL HIGH (ref 11.5–14.5)
WBC: 7.4 10*3/uL (ref 3.6–11.0)

## 2017-02-13 LAB — COMPREHENSIVE METABOLIC PANEL
ALBUMIN: 3.8 g/dL (ref 3.5–5.0)
ALT: 7 U/L — ABNORMAL LOW (ref 14–54)
AST: 16 U/L (ref 15–41)
Alkaline Phosphatase: 83 U/L (ref 38–126)
Anion gap: 9 (ref 5–15)
BUN: 14 mg/dL (ref 6–20)
CHLORIDE: 102 mmol/L (ref 101–111)
CO2: 27 mmol/L (ref 22–32)
Calcium: 9.4 mg/dL (ref 8.9–10.3)
Creatinine, Ser: 0.86 mg/dL (ref 0.44–1.00)
GFR calc Af Amer: >60 mL/min (ref >60)
GFR calc non Af Amer: >60 mL/min (ref >60)
Glucose, Bld: 105 mg/dL — ABNORMAL HIGH (ref 65–99)
POTASSIUM: 4 mmol/L (ref 3.5–5.1)
Sodium: 138 mmol/L (ref 135–145)
Total Bilirubin: 0.4 mg/dL (ref 0.3–1.2)
Total Protein: 7.3 g/dL (ref 6.5–8.1)

## 2017-02-13 MED ORDER — OXYCODONE-ACETAMINOPHEN 5-325 MG PO TABS: 1.0000 | ORAL_TABLET | Freq: Two times a day (BID) | ORAL | 0 refills | Status: DC | PRN

## 2017-02-13 NOTE — Progress Notes (Signed)
Patient denies any concerns today.  
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
## 2017-02-24 ENCOUNTER — Emergency Department: Payer: Medicare HMO

## 2017-02-24 ENCOUNTER — Other Ambulatory Visit: Payer: Self-pay

## 2017-02-24 ENCOUNTER — Inpatient Hospital Stay
Admission: EM | Admit: 2017-02-24 | Discharge: 2017-02-26 | DRG: 074 | Disposition: A | Discharge status: Home / Self Care | Payer: Medicare HMO | Attending: Internal Medicine | Admitting: Internal Medicine

## 2017-02-24 DIAGNOSIS — F17210 Nicotine dependence, cigarettes, uncomplicated: Secondary | ICD-10-CM | POA: Diagnosis present

## 2017-02-24 DIAGNOSIS — E1143 Type 2 diabetes mellitus with diabetic autonomic (poly)neuropathy: Principal | ICD-10-CM | POA: Diagnosis present

## 2017-02-24 DIAGNOSIS — N390 Urinary tract infection, site not specified: Secondary | ICD-10-CM | POA: Diagnosis present

## 2017-02-24 DIAGNOSIS — J9610 Chronic respiratory failure, unspecified whether with hypoxia or hypercapnia: Secondary | ICD-10-CM | POA: Diagnosis present

## 2017-02-24 DIAGNOSIS — Z8701 Personal history of pneumonia (recurrent): Secondary | ICD-10-CM

## 2017-02-24 DIAGNOSIS — K311 Adult hypertrophic pyloric stenosis: Secondary | ICD-10-CM

## 2017-02-24 DIAGNOSIS — K219 Gastro-esophageal reflux disease without esophagitis: Secondary | ICD-10-CM | POA: Diagnosis present

## 2017-02-24 DIAGNOSIS — E119 Type 2 diabetes mellitus without complications: Secondary | ICD-10-CM

## 2017-02-24 DIAGNOSIS — N179 Acute kidney failure, unspecified: Secondary | ICD-10-CM

## 2017-02-24 DIAGNOSIS — Z7951 Long term (current) use of inhaled steroids: Secondary | ICD-10-CM

## 2017-02-24 DIAGNOSIS — Z853 Personal history of malignant neoplasm of breast: Secondary | ICD-10-CM

## 2017-02-24 DIAGNOSIS — Z91018 Allergy to other foods: Secondary | ICD-10-CM

## 2017-02-24 DIAGNOSIS — Z801 Family history of malignant neoplasm of trachea, bronchus and lung: Secondary | ICD-10-CM | POA: Diagnosis not present

## 2017-02-24 DIAGNOSIS — E860 Dehydration: Secondary | ICD-10-CM | POA: Diagnosis present

## 2017-02-24 DIAGNOSIS — Z90710 Acquired absence of both cervix and uterus: Secondary | ICD-10-CM

## 2017-02-24 DIAGNOSIS — J449 Chronic obstructive pulmonary disease, unspecified: Secondary | ICD-10-CM | POA: Diagnosis present

## 2017-02-24 DIAGNOSIS — Z7982 Long term (current) use of aspirin: Secondary | ICD-10-CM | POA: Diagnosis not present

## 2017-02-24 DIAGNOSIS — K3184 Gastroparesis: Secondary | ICD-10-CM | POA: Diagnosis present

## 2017-02-24 DIAGNOSIS — Z9981 Dependence on supplemental oxygen: Secondary | ICD-10-CM

## 2017-02-24 DIAGNOSIS — Z9013 Acquired absence of bilateral breasts and nipples: Secondary | ICD-10-CM

## 2017-02-24 DIAGNOSIS — R112 Nausea with vomiting, unspecified: Secondary | ICD-10-CM

## 2017-02-24 DIAGNOSIS — I10 Essential (primary) hypertension: Secondary | ICD-10-CM | POA: Diagnosis present

## 2017-02-24 DIAGNOSIS — R1013 Epigastric pain: Secondary | ICD-10-CM

## 2017-02-24 HISTORY — DX: Acute kidney failure, unspecified: N17.9

## 2017-02-24 HISTORY — DX: Type 2 diabetes mellitus without complications: E11.9

## 2017-02-24 HISTORY — DX: Adult hypertrophic pyloric stenosis: K31.1

## 2017-02-24 LAB — COMPREHENSIVE METABOLIC PANEL
ALBUMIN: 4.5 g/dL (ref 3.5–5.0)
ALT: 9 U/L — AB (ref 14–54)
ANION GAP: 17 — AB (ref 5–15)
AST: 21 U/L (ref 15–41)
Alkaline Phosphatase: 94 U/L (ref 38–126)
BUN: 39 mg/dL — ABNORMAL HIGH (ref 6–20)
CHLORIDE: 88 mmol/L — AB (ref 101–111)
CO2: 31 mmol/L (ref 22–32)
Calcium: 9.7 mg/dL (ref 8.9–10.3)
Creatinine, Ser: 3.65 mg/dL — ABNORMAL HIGH (ref 0.44–1.00)
GFR calc non Af Amer: 12 mL/min — ABNORMAL LOW (ref >60)
GFR, EST AFRICAN AMERICAN: 14 mL/min — AB (ref >60)
Glucose, Bld: 136 mg/dL — ABNORMAL HIGH (ref 65–99)
Potassium: 3.9 mmol/L (ref 3.5–5.1)
SODIUM: 136 mmol/L (ref 135–145)
Total Bilirubin: 0.9 mg/dL (ref 0.3–1.2)
Total Protein: 8.9 g/dL — ABNORMAL HIGH (ref 6.5–8.1)

## 2017-02-24 LAB — CBC
HCT: 36 % (ref 35.0–47.0)
HEMOGLOBIN: 12.1 g/dL (ref 12.0–16.0)
MCH: 27.7 pg (ref 26.0–34.0)
MCHC: 33.6 g/dL (ref 32.0–36.0)
MCV: 82.6 fL (ref 80.0–100.0)
Platelets: 611 10*3/uL — ABNORMAL HIGH (ref 150–440)
RBC: 4.35 MIL/uL (ref 3.80–5.20)
RDW: 18.1 % — ABNORMAL HIGH (ref 11.5–14.5)
WBC: 20.1 10*3/uL — AB (ref 3.6–11.0)

## 2017-02-24 LAB — LACTIC ACID, PLASMA: LACTIC ACID, VENOUS: 1.1 mmol/L (ref 0.5–1.9)

## 2017-02-24 LAB — LIPASE, BLOOD: Lipase: 29 U/L (ref 11–51)

## 2017-02-24 LAB — TROPONIN I

## 2017-02-24 LAB — BRAIN NATRIURETIC PEPTIDE: B NATRIURETIC PEPTIDE 5: 9 pg/mL (ref 0.0–100.0)

## 2017-02-24 MED ORDER — FENTANYL CITRATE (PF) 100 MCG/2ML IJ SOLN
50.0000 ug | INTRAMUSCULAR | Status: DC | PRN
  Administered 2017-02-24: 50 ug via INTRAVENOUS
  Filled 2017-02-24: qty 2

## 2017-02-24 MED ORDER — SODIUM CHLORIDE 0.9 % IV SOLN
INTRAVENOUS | Status: DC
  Administered 2017-02-25: via INTRAVENOUS

## 2017-02-24 MED ORDER — MOMETASONE FURO-FORMOTEROL FUM 200-5 MCG/ACT IN AERO
2.0000 | INHALATION_SPRAY | Freq: Two times a day (BID) | RESPIRATORY_TRACT | Status: DC
  Administered 2017-02-25 (×2): 2 via RESPIRATORY_TRACT
  Filled 2017-02-24: qty 8.8

## 2017-02-24 MED ORDER — INSULIN ASPART 100 UNIT/ML ~~LOC~~ SOLN
0.0000 [IU] | Freq: Four times a day (QID) | SUBCUTANEOUS | Status: DC
  Administered 2017-02-25: 1 [IU] via SUBCUTANEOUS

## 2017-02-24 MED ORDER — ONDANSETRON HCL 4 MG/2ML IJ SOLN: 4.0000 mg | Freq: Four times a day (QID) | INTRAMUSCULAR | Status: DC | PRN

## 2017-02-24 MED ORDER — LIDOCAINE HCL (PF) 1 % IJ SOLN
5.0000 mL | Freq: Once | INTRAMUSCULAR | Status: DC
Start: 2017-02-24 — End: 2017-02-24
  Filled 2017-02-24: qty 5

## 2017-02-24 MED ORDER — HEPARIN SODIUM (PORCINE) 5000 UNIT/ML IJ SOLN
5000.0000 [IU] | Freq: Three times a day (TID) | INTRAMUSCULAR | Status: DC
  Administered 2017-02-25 – 2017-02-26 (×4): 5000 [IU] via SUBCUTANEOUS
  Filled 2017-02-24 (×4): qty 1

## 2017-02-24 MED ORDER — SODIUM CHLORIDE 0.9 % IV BOLUS (SEPSIS)
500.0000 mL | Freq: Once | INTRAVENOUS | Status: AC
  Administered 2017-02-24: 500 mL via INTRAVENOUS

## 2017-02-24 MED ORDER — ACETAMINOPHEN 650 MG RE SUPP: 650.0000 mg | Freq: Four times a day (QID) | RECTAL | Status: DC | PRN

## 2017-02-24 MED ORDER — SODIUM CHLORIDE 0.9 % IV BOLUS (SEPSIS)
1000.0000 mL | Freq: Once | INTRAVENOUS | Status: AC
  Administered 2017-02-24: 1000 mL via INTRAVENOUS

## 2017-02-24 MED ORDER — ONDANSETRON HCL 4 MG PO TABS: 4.0000 mg | ORAL_TABLET | Freq: Four times a day (QID) | ORAL | Status: DC | PRN

## 2017-02-24 MED ORDER — IOPAMIDOL (ISOVUE-300) INJECTION 61%: 30.0000 mL | Freq: Once | INTRAVENOUS | Status: DC | PRN

## 2017-02-24 MED ORDER — PANTOPRAZOLE SODIUM 40 MG IV SOLR
40.0000 mg | INTRAVENOUS | Status: DC
  Administered 2017-02-25 (×2): 40 mg via INTRAVENOUS
  Filled 2017-02-24 (×2): qty 40

## 2017-02-24 MED ORDER — LIDOCAINE HCL (PF) 1 % IJ SOLN
5.0000 mL | Freq: Once | INTRAMUSCULAR | Status: DC
  Filled 2017-02-24: qty 5

## 2017-02-24 MED ORDER — ONDANSETRON HCL 4 MG/2ML IJ SOLN
4.0000 mg | Freq: Once | INTRAMUSCULAR | Status: AC | PRN
  Administered 2017-02-24: 4 mg via INTRAVENOUS
  Filled 2017-02-24: qty 2

## 2017-02-24 MED ORDER — TIOTROPIUM BROMIDE MONOHYDRATE 18 MCG IN CAPS
18.0000 ug | ORAL_CAPSULE | RESPIRATORY_TRACT | Status: DC
  Administered 2017-02-25 – 2017-02-26 (×2): 18 ug via RESPIRATORY_TRACT
  Filled 2017-02-24: qty 5

## 2017-02-24 MED ORDER — ACETAMINOPHEN 325 MG PO TABS: 650.0000 mg | ORAL_TABLET | Freq: Four times a day (QID) | ORAL | Status: DC | PRN

## 2017-02-24 MED ORDER — MORPHINE SULFATE (PF) 2 MG/ML IV SOLN
2.0000 mg | INTRAVENOUS | Status: DC | PRN
Start: 2017-02-24 — End: 2017-02-26

## 2017-02-24 NOTE — H&P (Signed)
[facility] [place] NAME: Sophia Nelson    MR#:  [id number]  DATE OF BIRTH:  Jan 09, 1946  DATE OF ADMISSION:  02/24/2017  PRIMARY CARE PHYSICIAN: Donnie Coffin, MD   REQUESTING/REFERRING PHYSICIAN: Quentin Cornwall, MD  CHIEF COMPLAINT:   Chief Complaint  Patient presents with  . Abdominal Pain  . Emesis    HISTORY OF PRESENT ILLNESS:  Sophia Nelson  is a 71 y.o. female who presents with Abdominal pain with vomiting for the past 2 days. Here in the ED she was found on imaging to have gastric outlet obstruction. She also has significant AKI.  Patient has no prior history of gastric outlet obstruction. Hospitalists were called for admission  PAST MEDICAL HISTORY:   Past Medical History:  Diagnosis Date  . Anemia   . Arthritis   . Asthma   . 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  [name] COPD (chronic obstructive pulmonary disease) ([place])   . Diabetes mellitus without complication ([place])   . GERD (gastroesophageal reflux disease)   . Headache    h/o migraines as a child  . Hypertension   . Pneumonia 2015  . Shortness of breath dyspnea    with exertion    PAST SURGICAL HISTORY:   Past Surgical History:  Procedure Laterality Date  . ABDOMINAL HYSTERECTOMY    . BREAST SURGERY Right 2015   mastectomy  . SIMPLE MASTECTOMY WITH AXILLARY SENTINEL NODE BIOPSY Left 02/29/2016   Procedure: SIMPLE MASTECTOMY;  Surgeon: Christene Lye, MD;  Location: [facility] ORS;  Service: General;  Laterality: Left;  . TUBAL LIGATION      SOCIAL HISTORY:   Social History  Substance Use Topics  . Smoking status: Current Some Day Smoker    Packs/day: 0.25    Years: 15.00    Types: Cigarettes    Last attempt to quit: 02/25/2016  . Smokeless tobacco: Never Used     Comment: currently as of 02-21-16 pt states she is only smoking 2-3 cigarettes per day  .  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 Lung cancer Father   . Breast cancer Neg Hx     DRUG ALLERGIES:   Allergies  Allergen Reactions  . Other     Onions(that grow in the yard-pt can eat onions without problems) and dust mite Sneezing, cough, runny nose    MEDICATIONS AT HOME:   Prior to Admission medications   Medication Sig Start Date End Date Taking? Authorizing Provider  ADVAIR DISKUS 500-50 MCG/DOSE AEPB Inhale 1 puff into the lungs 2 (two) times daily.  05/04/15   [provider]  albuterol (PROVENTIL HFA;VENTOLIN HFA) 108 (90 Base) MCG/ACT inhaler Inhale 2 puffs into the lungs every 6 (six) hours as needed for wheezing or shortness of breath.    [provider]  aspirin 81 MG tablet Take 81 mg by mouth daily.    [provider]  calcium carbonate (OSCAL) 1500 (600 Ca) MG TABS tablet Take 1,500 mg by mouth 2 (two) times daily with a meal.    [provider]  docusate sodium (COLACE) 100 MG capsule Take 100 mg by mouth  2 (two) times daily.    [provider]  fluticasone (FLONASE) 50 MCG/ACT nasal spray Place 2 sprays into both nostrils daily.    [provider]  GLIPIZIDE XL 2.5 MG 24 hr tablet Take 2.5 mg by mouth daily with breakfast.  06/23/15   [provider]  hydrochlorothiazide (HYDRODIURIL) 12.5 MG tablet Take 12.5 mg by mouth daily. 02/08/17   [provider]  letrozole (FEMARA) 2.5 MG tablet Take 1 tablet (2.5 mg total) by mouth daily. 11/01/16   [name], MD  lisinopril (PRINIVIL,ZESTRIL) 40 MG tablet Take 40 mg by mouth every morning.  06/23/15   [provider]  montelukast (SINGULAIR) 10 MG tablet Take 10 mg by mouth at bedtime.    [provider]  omeprazole (PRILOSEC) 20 MG capsule Take 20 mg by mouth  every morning.  06/23/15   [provider]  oxyCODONE-acetaminophen (ROXICET) 5-325 MG tablet Take 1 tablet by mouth every 12 (twelve) hours as needed for severe pain. 02/13/17   Lloyd Huger, MD  OXYGEN Inhale 2 L into the lungs continuous.     [provider]  palbociclib (IBRANCE) 125 MG capsule Take 125 mg by mouth daily with lunch.  03/14/16   [provider]  SPIRIVA HANDIHALER 18 MCG inhalation capsule Place 18 mcg into inhaler and inhale every morning.  05/04/15   [provider]    REVIEW OF SYSTEMS:  Review of Systems  Constitutional: Negative for chills, fever, malaise/fatigue and weight loss.  HENT: Negative for ear pain, hearing loss and tinnitus.   Eyes: Negative for blurred vision, double vision, pain and redness.  Respiratory: Negative for cough, hemoptysis and shortness of breath.   Cardiovascular: Negative for chest pain, palpitations, orthopnea and leg swelling.  Gastrointestinal: Positive for abdominal pain, nausea and vomiting. Negative for constipation and diarrhea.  Genitourinary: Negative for dysuria, frequency and hematuria.  Musculoskeletal: Negative for back pain, joint pain and neck pain.  Skin:       No acne, rash, or lesions  Neurological: Negative for dizziness, tremors, focal weakness and weakness.  Endo/Heme/Allergies: Negative for polydipsia. Does not bruise/bleed easily.  Psychiatric/Behavioral: Negative for depression. The patient is not nervous/anxious and does not have insomnia.      VITAL SIGNS:   Vitals:   02/24/17 2030 02/24/17 2100 02/24/17 2130 02/24/17 2200  BP: (!) 125/56 (!) 119/54 133/83 110/65  Pulse: 84 80 100 97  Resp: 18 12 20 16   Temp:      TempSrc:      SpO2: 100% 100% 100% 100%  Weight:      Height:       Wt Readings from Last 3 Encounters:  02/24/17 68.5 kg (151 lb)  02/13/17 69 kg (152 lb 1.6 oz)  12/12/16 70.9 kg (156 lb 4 oz)    PHYSICAL EXAMINATION:  Physical Exam  Vitals  reviewed. Constitutional: She is oriented to person, place, and time. She appears well-developed and well-nourished. No distress.  HENT:  Head: Normocephalic and atraumatic.  Dry mucous membranes  Eyes: Conjunctivae and EOM are normal. Pupils are equal, round, and reactive to light. No scleral icterus.  Neck: Normal range of motion. Neck supple. No JVD present. No thyromegaly present.  Cardiovascular: Normal rate, regular rhythm and intact distal pulses.  Exam reveals no gallop and no friction rub.   No murmur heard. Respiratory: Effort normal and breath sounds normal. No respiratory distress. She has no wheezes. She has no rales.  GI: Soft.  Bowel sounds are normal. She exhibits no distension. There is tenderness.  Musculoskeletal: Normal range of motion. She exhibits no edema.  No arthritis, no gout  Lymphadenopathy:    She has no cervical adenopathy.  Neurological: She is alert and oriented to person, place, and time. No cranial nerve deficit.  No dysarthria, no aphasia  Skin: Skin is warm and dry. No rash noted. No erythema.  Psychiatric: She has a normal mood and affect. Her behavior is normal. Judgment and thought content normal.    LABORATORY PANEL:   CBC  Recent Labs Lab 02/24/17 1602  WBC 20.1*  HGB 12.1  HCT 36.0  PLT 611*   ------------------------------------------------------------------------------------------------------------------  Chemistries   Recent Labs Lab 02/24/17 1630  NA 136  K 3.9  CL 88*  CO2 31  GLUCOSE 136*  BUN 39*  CREATININE 3.65*  CALCIUM 9.7  AST 21  ALT 9*  ALKPHOS 94  BILITOT 0.9   ------------------------------------------------------------------------------------------------------------------  Cardiac Enzymes  Recent Labs Lab 02/24/17 1630  TROPONINI <0.03   ------------------------------------------------------------------------------------------------------------------  RADIOLOGY:  Ct Abdomen Pelvis Wo  Contrast  Result Date: 02/24/2017 CLINICAL DATA:  Initial evaluation for acute generalized lower/ mid abdominal pain with nausea and vomiting. Concern for obstruction. EXAM: CT CHEST, ABDOMEN AND PELVIS WITHOUT CONTRAST TECHNIQUE: Multidetector CT imaging of the chest, abdomen and pelvis was performed following the standard protocol without IV contrast. COMPARISON:  Prior CT from 11/02/2016. FINDINGS: CT CHEST FINDINGS Cardiovascular: Intrathoracic aorta of normal caliber without aneurysm. Moderate aortic atherosclerosis. Partially visualized great vessels grossly within normal limits. Heart size normal. Three vessel coronary artery calcifications. No pericardial effusion. Mediastinum/Nodes: Thyroid normal. No pathologically enlarged mediastinal, hilar, or axillary lymph nodes identified. Esophagus within normal limits. Lungs/Pleura: Tracheobronchial tree is patent. Upper lobe predominant emphysema. Linear atelectasis present within the lingula and left lower lobe. Lungs are otherwise clear. No focal infiltrates. No pulmonary edema or pleural effusion. No pneumothorax. No worrisome pulmonary nodule or mass. Musculoskeletal: No acute osseus abnormality. No worrisome lytic or blastic osseous lesions. CT ABDOMEN PELVIS FINDINGS Hepatobiliary: Limited noncontrast evaluation of the liver is unremarkable. Punctate calcification within the gallbladder may reflect a small stone versus mural calcification. No evidence for acute cholecystitis. No biliary dilatation. Pancreas: Pancreas within normal limits. Spleen: Spleen within normal limits. Adrenals/Urinary Tract: Adrenal glands within normal limits. Kidneys equal in size without evidence for nephrolithiasis or hydronephrosis. No hydroureter. Bladder partially distended and within normal limits. Stomach/Bowel: Prominent gastric distension. Additionally, there is mild distention of the duodenum to the level of the ligament of Treitz. No obvious obstructive mass identified,  although an underlying obstructive lesion could conceivably be present. Distally, small bowel is decompressed and of normal caliber. Appendix normal. Colon is diffusely decompressed. No other acute inflammatory changes seen about the bowels. Vascular/Lymphatic: Moderate aorto bi-iliac atherosclerotic disease. No aneurysm. No appreciable adenopathy on this noncontrast examination. Reproductive: Uterus is not absent. Ovaries not discretely identified. Other: No free air or fluid. Musculoskeletal: No acute osseous abnormality. No worrisome lytic or blastic osseous lesions. IMPRESSION: 1. Prominent distension of the stomach and duodenum to the level of the ligament of Treitz, with decompression of the small bowel distally. While this finding may be transient and physiologic in nature, a possible proximal obstructive process could be considered in the correct clinical setting. No obvious mass or other abnormality identified on this noncontrast examination. Correlation with symptomatology recommended. Additionally, GI consultation with possible further evaluation with upper endoscopy could be considered as clinically warranted. 2. No other acute  abnormality within the chest, abdomen, and pelvis. 3. Subcentimeter calcification within the gallbladder, which may reflect a small stone or possibly mural calcification. 4. Emphysema. 5. Moderate atherosclerosis with diffuse 3 vessel coronary artery calcifications. 6. Electronically Signed   By: Jeannine Boga M.D.   On: 02/24/2017 20:45   Ct Chest Wo Contrast  Result Date: 02/24/2017 CLINICAL DATA:  Initial evaluation for acute generalized lower/ mid abdominal pain with nausea and vomiting. Concern for obstruction. EXAM: CT CHEST, ABDOMEN AND PELVIS WITHOUT CONTRAST TECHNIQUE: Multidetector CT imaging of the chest, abdomen and pelvis was performed following the standard protocol without IV contrast. COMPARISON:  Prior CT from 11/02/2016. FINDINGS: CT CHEST FINDINGS  Cardiovascular: Intrathoracic aorta of normal caliber without aneurysm. Moderate aortic atherosclerosis. Partially visualized great vessels grossly within normal limits. Heart size normal. Three vessel coronary artery calcifications. No pericardial effusion. Mediastinum/Nodes: Thyroid normal. No pathologically enlarged mediastinal, hilar, or axillary lymph nodes identified. Esophagus within normal limits. Lungs/Pleura: Tracheobronchial tree is patent. Upper lobe predominant emphysema. Linear atelectasis present within the lingula and left lower lobe. Lungs are otherwise clear. No focal infiltrates. No pulmonary edema or pleural effusion. No pneumothorax. No worrisome pulmonary nodule or mass. Musculoskeletal: No acute osseus abnormality. No worrisome lytic or blastic osseous lesions. CT ABDOMEN PELVIS FINDINGS Hepatobiliary: Limited noncontrast evaluation of the liver is unremarkable. Punctate calcification within the gallbladder may reflect a small stone versus mural calcification. No evidence for acute cholecystitis. No biliary dilatation. Pancreas: Pancreas within normal limits. Spleen: Spleen within normal limits. Adrenals/Urinary Tract: Adrenal glands within normal limits. Kidneys equal in size without evidence for nephrolithiasis or hydronephrosis. No hydroureter. Bladder partially distended and within normal limits. Stomach/Bowel: Prominent gastric distension. Additionally, there is mild distention of the duodenum to the level of the ligament of Treitz. No obvious obstructive mass identified, although an underlying obstructive lesion could conceivably be present. Distally, small bowel is decompressed and of normal caliber. Appendix normal. Colon is diffusely decompressed. No other acute inflammatory changes seen about the bowels. Vascular/Lymphatic: Moderate aorto bi-iliac atherosclerotic disease. No aneurysm. No appreciable adenopathy on this noncontrast examination. Reproductive: Uterus is not absent.  Ovaries not discretely identified. Other: No free air or fluid. Musculoskeletal: No acute osseous abnormality. No worrisome lytic or blastic osseous lesions. IMPRESSION: 1. Prominent distension of the stomach and duodenum to the level of the ligament of Treitz, with decompression of the small bowel distally. While this finding may be transient and physiologic in nature, a possible proximal obstructive process could be considered in the correct clinical setting. No obvious mass or other abnormality identified on this noncontrast examination. Correlation with symptomatology recommended. Additionally, GI consultation with possible further evaluation with upper endoscopy could be considered as clinically warranted. 2. No other acute abnormality within the chest, abdomen, and pelvis. 3. Subcentimeter calcification within the gallbladder, which may reflect a small stone or possibly mural calcification. 4. Emphysema. 5. Moderate atherosclerosis with diffuse 3 vessel coronary artery calcifications. 6. Electronically Signed   By: Jeannine Boga M.D.   On: 02/24/2017 20:45   Dg Chest Portable 1 View  Result Date: 02/24/2017 CLINICAL DATA:  Initial evaluation for central line placement. EXAM: PORTABLE CHEST 1 VIEW COMPARISON:  Prior CT from 11/02/2016. FINDINGS: Right IJ approach centra venous catheter in place with tip overlying the distal SVC. Cardiac mediastinal silhouettes are within normal limits. Aortic atherosclerosis. Lungs mildly hypoinflated. Mild subsegmental atelectasis at the bilateral lung bases. No focal infiltrates. No pulmonary edema or pleural effusion. No pneumothorax. No acute osseus abnormality.  IMPRESSION: 1. Right IJ approach centra venous catheter in place with tip overlying the distal SVC. 2. Shallow lung inflation with mild bibasilar subsegmental atelectasis. 3. No other active cardiopulmonary disease. 4. Aortic atherosclerosis. Electronically Signed   By: Jeannine Boga M.D.   On:  02/24/2017 18:49   Dg Abd Portable 1 View  Result Date: 02/24/2017 CLINICAL DATA:  Evaluate NG tube placement. EXAM: PORTABLE ABDOMEN - 1 VIEW COMPARISON:  None FINDINGS: The side port of the NG tube is 7 cm above the GE junction with the tip at the GE junction. No other abnormalities. IMPRESSION: The side-port of the NG tube is 7 cm above the GE junction with the tip at the GE junction. Recommend advancement. Electronically Signed   By: Dorise Bullion III M.D   On: 02/24/2017 21:55    EKG:   Orders placed or performed during the hospital encounter of 02/24/17  . ED EKG  . ED EKG    IMPRESSION AND PLAN:  Principal Problem:   Gastric outlet obstruction - noncontrast CT abdomen showed likely gastric outlet obstruction, did not identify any significant mass. NG tube has provided some relief from her discomfort. We'll get a gastroenterology consult in the morning Active Problems:   AKI (acute kidney injury) ([place]) - likely due to her vomiting and dehydration. IV fluids, avoid nephrotoxins, monitor for improvement   Diabetes ([facility]) - sliding scale insulin with corresponding glucose checks   COPD (chronic obstructive pulmonary disease) ([place]) - continue home meds   GERD (gastroesophageal reflux disease) - PPI  All the records are reviewed and case discussed with ED provider. Management plans discussed with the patient and/or family.  DVT PROPHYLAXIS: SubQ heparin  GI PROPHYLAXIS: PPI  ADMISSION STATUS: Inpatient  CODE STATUS: Full Code Status History    This patient does not have a recorded code status. Please follow your organizational policy for patients in this situation.      TOTAL TIME TAKING CARE OF THIS PATIENT: 45 minutes.   Joenathan Sakuma [place] 02/24/2017, 10:29 PM  [facility]  Office  [phone]  CC: Primary care physician; Donnie Coffin, MD  Note:  This document was prepared using Dragon voice recognition software and may include unintentional  dictation errors.

## 2017-02-24 NOTE — ED Notes (Addendum)
Lab results delayed due to IV access needed. Pt stuck multiple times by RNs and MD with and without ultrasound. MD chose to put a central line in. Labs sent as soon at central line in place.

## 2017-02-24 NOTE — ED Triage Notes (Signed)
Pt presents via EMS from home c/o generalized lower-mid abd pain with N/V since Friday. Pt has distended abd which is reported as "normal" per EMS. Reports pain 10/10. Hx stomach cancer per pt.

## 2017-02-24 NOTE — ED Notes (Signed)
Pt provided few ice chips per MD Jannifer Franklin request.

## 2017-02-24 NOTE — ED Provider Notes (Signed)
[facility] Emergency Department Provider Note    First MD Initiated Contact with Patient 02/24/17 1555     (approximate)  I have reviewed the triage vital signs and the nursing notes.   HISTORY  Chief Complaint Abdominal Pain and Emesis    HPI Sophia Nelson is a 71 y.o. female presents from home with a history of breast cancer and COPD on chronic oxygen 2 L nasal cannula presents with nausea vomiting and generalized midabdominal pain since Friday. Has not been able to eat or drink anything since then. No measured fevers. States she's not moving her bowels. Since she was passing some gas earlier today. Denies any previous history of obstructions. Does have a history of reflux. Denies any worsening shortness of breath or chest pain at this time.   Past Medical History:  Diagnosis Date  . Anemia   . Arthritis   . Asthma   . Breast cancer ([place]) 2009   RT MASTECTOMY, DCIS  . Cancer of left breast ([place]) 08/09/2015   T4 N1 M1 tumor, INVASIVE LOBULAR CARCINOMA.  [name] COPD (chronic obstructive pulmonary disease) ([place])   . Diabetes mellitus without complication ([place])   . GERD (gastroesophageal reflux disease)   . Headache    h/o migraines as a child  . Hypertension   . Pneumonia 2015  . Shortness of breath dyspnea    with exertion   Family History  Problem Relation Age of Onset  . Lung cancer Father   . Breast cancer Neg Hx    Past Surgical History:  Procedure Laterality Date  . ABDOMINAL HYSTERECTOMY    . BREAST SURGERY Right 2015   mastectomy  . SIMPLE MASTECTOMY WITH AXILLARY SENTINEL NODE BIOPSY Left 02/29/2016   Procedure: SIMPLE MASTECTOMY;  Surgeon: Christene Lye, MD;  Location: [facility] ORS;  Service: General;  Laterality: Left;  . TUBAL LIGATION     Patient Active Problem List   Diagnosis Date Noted  . Neck pain 04/19/2016  . Carcinoma of overlapping sites of left breast in female, estrogen receptor positive ([place]) 03/22/2016       Prior to Admission medications   Medication Sig Start Date End Date Taking? Authorizing Provider  ADVAIR DISKUS 500-50 MCG/DOSE AEPB Inhale 1 puff into the lungs 2 (two) times daily.  05/04/15   [provider]  albuterol (PROVENTIL HFA;VENTOLIN HFA) 108 (90 Base) MCG/ACT inhaler Inhale 2 puffs into the lungs every 6 (six) hours as needed for wheezing or shortness of breath.    [provider]  aspirin 81 MG tablet Take 81 mg by mouth daily.    [provider]  calcium carbonate (OSCAL) 1500 (600 Ca) MG TABS tablet Take 1,500 mg by mouth 2 (two) times daily with a meal.    [provider]  docusate sodium (COLACE) 100 MG capsule Take 100 mg by mouth 2 (two) times daily.    [provider]  fluticasone (FLONASE) 50 MCG/ACT nasal spray Place 2 sprays into both nostrils daily.    [provider]  GLIPIZIDE XL 2.5 MG 24 hr tablet Take 2.5 mg by mouth daily with breakfast.  06/23/15   [provider]  letrozole (FEMARA) 2.5 MG tablet Take 1 tablet (2.5 mg total) by mouth daily. 11/01/16   [name], MD  lisinopril (PRINIVIL,ZESTRIL) 40 MG tablet Take 40 mg by mouth every morning.  06/23/15   [provider]  montelukast (SINGULAIR) 10 MG tablet Take 10 mg by mouth at  bedtime.    [provider]  omeprazole (PRILOSEC) 20 MG capsule Take 20 mg by mouth every morning.  06/23/15   [provider]  oxyCODONE-acetaminophen (ROXICET) 5-325 MG tablet Take 1 tablet by mouth every 12 (twelve) hours as needed for severe pain. 02/13/17   Lloyd Huger, MD  OXYGEN Inhale 2 L into the lungs continuous.     [provider]  palbociclib (IBRANCE) 125 MG capsule Take 125 mg by mouth daily with lunch.  03/14/16   [provider]  SPIRIVA HANDIHALER 18 MCG inhalation capsule Place 18 mcg into inhaler and inhale every morning.  05/04/15   [provider]    Allergies Other    Social  History Social History  Substance Use Topics  . Smoking status: Current Some Day Smoker    Packs/day: 0.25    Years: 15.00    Types: Cigarettes    Last attempt to quit: 02/25/2016  . Smokeless tobacco: Never Used     Comment: currently as of 02-21-16 pt states she is only smoking 2-3 cigarettes per day  . 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 ____________________________________________   PHYSICAL EXAM:  VITAL SIGNS: Vitals:   02/24/17 1558  BP: 100/70  Pulse: 95  Resp: 14  Temp: 98.7 F (37.1 C)    Constitutional: Alert and oriented. ill appearing but in no acute distress. Eyes: Conjunctivae are normal.  Head: Atraumatic. Nose: No congestion/rhinnorhea. Mouth/Throat: Mucous membranes are moist.   Neck: No stridor. Painless ROM.  Cardiovascular: Normal rate, regular rhythm. Grossly normal heart sounds.  Good peripheral circulation. Respiratory: Normal respiratory effort.  No retractions. Lungs CTAB. Gastrointestinal: Soft with diffuse ttp, diminished bowel sounds. No distention. No abdominal bruits. No CVA tenderness. Musculoskeletal: No lower extremity tenderness nor edema.  No joint effusions. Neurologic:  Normal speech and language. No gross focal neurologic deficits are appreciated. No facial droop Skin:  Skin is warm, dry and intact. No rash noted. Psychiatric: Mood and affect are normal. Speech and behavior are normal.  ____________________________________________   LABS (all labs ordered are listed, but only abnormal results are displayed)  Results for orders placed or performed during the hospital encounter of 02/24/17  (from the past 24 hour(s))  CBC     Status: Abnormal   Collection Time: 02/24/17  4:02 PM  Result Value Ref Range   WBC 20.1 (H) 3.6 - 11.0 K/uL   RBC 4.35 3.80 - 5.20 MIL/uL   Hemoglobin 12.1 12.0 - 16.0 g/dL   HCT 36.0 35.0 - 47.0 %   MCV 82.6 80.0 - 100.0 fL   MCH 27.7 26.0 - 34.0 pg   MCHC 33.6 32.0 - 36.0 g/dL   RDW 18.1 (H) 11.5 - 14.5 %   Platelets 611 (H) 150 - 440 K/uL  Lactic acid, plasma     Status: None   Collection Time: 02/24/17  4:30 PM  Result Value Ref Range   Lactic Acid, Venous 1.1 0.5 - 1.9 mmol/L  Comprehensive metabolic panel     Status: Abnormal   Collection Time: 02/24/17  4:30 PM  Result Value Ref Range   Sodium 136 135 - 145 mmol/L  Potassium 3.9 3.5 - 5.1 mmol/L   Chloride 88 (L) 101 - 111 mmol/L   CO2 31 22 - 32 mmol/L   Glucose, Bld 136 (H) 65 - 99 mg/dL   BUN 39 (H) 6 - 20 mg/dL   Creatinine, Ser 3.65 (H) 0.44 - 1.00 mg/dL   Calcium 9.7 8.9 - 10.3 mg/dL   Total Protein 8.9 (H) 6.5 - 8.1 g/dL   Albumin 4.5 3.5 - 5.0 g/dL   AST 21 15 - 41 U/L   ALT 9 (L) 14 - 54 U/L   Alkaline Phosphatase 94 38 - 126 U/L   Total Bilirubin 0.9 0.3 - 1.2 mg/dL   GFR calc non Af Amer 12 (L) >60 mL/min   GFR calc Af Amer 14 (L) >60 mL/min   Anion gap 17 (H) 5 - 15  Lipase, blood     Status: None   Collection Time: 02/24/17  4:30 PM  Result Value Ref Range   Lipase 29 11 - 51 U/L  Troponin I     Status: None   Collection Time: 02/24/17  4:30 PM  Result Value Ref Range   Troponin I <0.03 <0.03 ng/mL  Brain natriuretic peptide     Status: None   Collection Time: 02/24/17  7:52 PM  Result Value Ref Range   B Natriuretic Peptide 9.0 0.0 - 100.0 pg/mL  Glucose, capillary     Status: Abnormal   Collection Time: 02/25/17 12:14 AM  Result Value Ref Range   Glucose-Capillary 124 (H) 65 - 99 mg/dL   ____________________________________________  EKG My review and personal interpretation at Time: 16:38   Indication: nausea and vomiting  Rate: 110  Rhythm: sinus  Axis: normal Other: borderline prolonged QT, no stemi ____________________________________________  RADIOLOGY  I personally reviewed all radiographic images ordered to evaluate for the above acute complaints and reviewed radiology reports and findings.  These findings were personally discussed with the patient.  Please see medical record for radiology report.  ____________________________________________   PROCEDURES  Procedure(s) performed:  .Central Line Date/Time: 02/24/2017 7:13 PM Performed by: Merlyn Lot Authorized by: Merlyn Lot   Consent:    Consent obtained:  Verbal   Consent given by:  Patient   Risks discussed:  Arterial puncture, incorrect placement, nerve damage, pneumothorax, infection and bleeding   Alternatives discussed:  No treatment Pre-procedure details:    Hand hygiene: Hand hygiene performed prior to insertion     Sterile barrier technique: All elements of maximal sterile technique followed     Skin preparation:  2% chlorhexidine   Skin preparation agent: Skin preparation agent completely dried prior to procedure   Anesthesia (see MAR for exact dosages):    Anesthesia method:  Local infiltration   Local anesthetic:  Lidocaine 1% w/o epi Procedure details:    Location:  R internal jugular   Patient position:  Flat   Procedural supplies:  Triple lumen   Catheter size:  7 Fr   Landmarks identified: yes     Ultrasound guidance: yes     Sterile ultrasound techniques: Sterile gel and sterile probe covers were used     Number of attempts:  1   Successful placement: yes   Post-procedure details:    Post-procedure:  Dressing applied and line sutured   Assessment:  Blood return through all ports   Patient tolerance of procedure:  Tolerated well, no immediate complications      Critical Care performed: yes CRITICAL CARE Performed by: Merlyn Lot   Total  critical care time: 35 minutes  Critical care time was exclusive of separately  billable procedures and treating other patients.  Critical care was necessary to treat or prevent imminent or life-threatening deterioration.  Critical care was time spent personally by me on the following activities: development of treatment plan with patient and/or surrogate as well as nursing, discussions with consultants, evaluation of patient's response to treatment, examination of patient, obtaining history from patient or surrogate, ordering and performing treatments and interventions, ordering and review of laboratory studies, ordering and review of radiographic studies, pulse oximetry and re-evaluation of patient's condition.  ____________________________________________   INITIAL IMPRESSION / ASSESSMENT AND PLAN / ED COURSE  Pertinent labs & imaging results that were available during my care of the patient were reviewed by me and considered in my medical decision making (see chart for details).  DDX: sbo, colitis, enteritis, cholecystitis, cholelithiasis, AAA, uti, dehydration  KAMALA KOLTON is a 71 y.o. who presents to the ED with nausea vomiting and epigastric pain as described above. Patient is ill-appearing. Protecting her airway currently. Patient with very difficult IV access as she has a history of bilateral mastectomy and is on chemotherapy. I am concerned for small bowel obstruction given her presentation.  The patient will be placed on continuous pulse oximetry and telemetry for monitoring.  Laboratory evaluation will be sent to evaluate for the above complaints.     Clinical Course as of Feb 26 31  Nancy Fetter Feb 24, 2017  1819 Central line was inserted without complication. Central line required due to inability to obtain IV access after multiple attempts even using ultrasound guidance. Patient with very limited peripheral IV access due to hypovolemia status post bilateral mastectomies.  [PR]  2124 Discussed results of CAT scan and blood work with patient. Patient with  profound ache AI and contraction alkalosis. Patient with evidence of gastric outlet obstruction that will need admission hospital for further symptomatic management and GI consultation.  NG tube placed for decompression.  Have discussed with the patient and available family all diagnostics and treatments performed thus far and all questions were answered to the best of my ability. The patient demonstrates understanding and agreement with plan.   [PR]    Clinical Course User Index [PR] Merlyn Lot, MD     ____________________________________________   FINAL CLINICAL IMPRESSION(S) / ED DIAGNOSES  Final diagnoses:  Gastric outlet obstruction  AKI (acute kidney injury) ([place])  Nausea and vomiting, intractability of vomiting not specified, unspecified vomiting type  Epigastric pain      NEW MEDICATIONS STARTED DURING THIS VISIT:  New Prescriptions   No medications on file     Note:  This document was prepared using Dragon voice recognition software and may include unintentional dictation errors.    Merlyn Lot, MD 02/25/17 [phone]

## 2017-02-24 NOTE — ED Notes (Addendum)
Report to [place], Therapist, sports, denies questions at this time. Transport by [employer], EDT.

## 2017-02-25 ENCOUNTER — Encounter: Payer: Self-pay | Admitting: Anesthesiology

## 2017-02-25 ENCOUNTER — Inpatient Hospital Stay: Payer: Medicare HMO | Admitting: Anesthesiology

## 2017-02-25 ENCOUNTER — Encounter
Admission: EM | Disposition: A | Discharge status: Home / Self Care | Payer: Self-pay | Source: Home / Self Care | Attending: Internal Medicine

## 2017-02-25 DIAGNOSIS — K311 Adult hypertrophic pyloric stenosis: Secondary | ICD-10-CM

## 2017-02-25 HISTORY — PX: ESOPHAGOGASTRODUODENOSCOPY: SHX5428

## 2017-02-25 LAB — URINALYSIS, COMPLETE (UACMP) WITH MICROSCOPIC
Bacteria, UA: NONE SEEN
Bilirubin Urine: NEGATIVE
Glucose, UA: NEGATIVE mg/dL
HGB URINE DIPSTICK: NEGATIVE
Ketones, ur: NEGATIVE mg/dL
Nitrite: NEGATIVE
PH: 5 (ref 5.0–8.0)
Protein, ur: NEGATIVE mg/dL
SPECIFIC GRAVITY, URINE: 1.016 (ref 1.005–1.030)

## 2017-02-25 LAB — CBC
HCT: 29.2 % — ABNORMAL LOW (ref 35.0–47.0)
Hemoglobin: 9.7 g/dL — ABNORMAL LOW (ref 12.0–16.0)
MCH: 27.6 pg (ref 26.0–34.0)
MCHC: 33.1 g/dL (ref 32.0–36.0)
MCV: 83.3 fL (ref 80.0–100.0)
PLATELETS: 468 10*3/uL — AB (ref 150–440)
RBC: 3.5 MIL/uL — ABNORMAL LOW (ref 3.80–5.20)
RDW: 18.1 % — AB (ref 11.5–14.5)
WBC: 15.3 10*3/uL — AB (ref 3.6–11.0)

## 2017-02-25 LAB — BASIC METABOLIC PANEL
Anion gap: 13 (ref 5–15)
BUN: 44 mg/dL — AB (ref 6–20)
CO2: 29 mmol/L (ref 22–32)
Calcium: 8.6 mg/dL — ABNORMAL LOW (ref 8.9–10.3)
Chloride: 97 mmol/L — ABNORMAL LOW (ref 101–111)
Creatinine, Ser: 2.64 mg/dL — ABNORMAL HIGH (ref 0.44–1.00)
GFR calc Af Amer: 20 mL/min — ABNORMAL LOW (ref >60)
GFR, EST NON AFRICAN AMERICAN: 17 mL/min — AB (ref >60)
Glucose, Bld: 117 mg/dL — ABNORMAL HIGH (ref 65–99)
Potassium: 3.5 mmol/L (ref 3.5–5.1)
SODIUM: 139 mmol/L (ref 135–145)

## 2017-02-25 LAB — GLUCOSE, CAPILLARY
GLUCOSE-CAPILLARY: 124 mg/dL — AB (ref 65–99)
Glucose-Capillary: 112 mg/dL — ABNORMAL HIGH (ref 65–99)
Glucose-Capillary: 93 mg/dL (ref 65–99)
Glucose-Capillary: 122 mg/dL — ABNORMAL HIGH (ref 65–99)
Glucose-Capillary: 110 mg/dL — ABNORMAL HIGH (ref 65–99)

## 2017-02-25 SURGERY — EGD (ESOPHAGOGASTRODUODENOSCOPY)
Anesthesia: General

## 2017-02-25 MED ORDER — PROPOFOL 10 MG/ML IV BOLUS
INTRAVENOUS | Status: DC | PRN
  Administered 2017-02-25: 40 mg via INTRAVENOUS

## 2017-02-25 MED ORDER — METOCLOPRAMIDE HCL 5 MG/ML IJ SOLN
10.0000 mg | Freq: Three times a day (TID) | INTRAMUSCULAR | Status: DC
  Administered 2017-02-25 – 2017-02-26 (×3): 10 mg via INTRAVENOUS
  Filled 2017-02-25 (×3): qty 2

## 2017-02-25 MED ORDER — PROPOFOL 500 MG/50ML IV EMUL
INTRAVENOUS | Status: DC | PRN
  Administered 2017-02-25: 140 ug/kg/min via INTRAVENOUS

## 2017-02-25 MED ORDER — ORAL CARE MOUTH RINSE
15.0000 mL | Freq: Two times a day (BID) | OROMUCOSAL | Status: DC
  Administered 2017-02-25 – 2017-02-26 (×3): 15 mL via OROMUCOSAL

## 2017-02-25 MED ORDER — SODIUM CHLORIDE 0.9 % IV SOLN
INTRAVENOUS | Status: DC
  Administered 2017-02-25: 17:00:00 via INTRAVENOUS

## 2017-02-25 MED ORDER — DEXTROSE 5 % IV SOLN
1.0000 g | INTRAVENOUS | Status: DC
  Administered 2017-02-25: 1 g via INTRAVENOUS
  Filled 2017-02-25 (×2): qty 10

## 2017-02-25 MED ORDER — PROPOFOL 10 MG/ML IV BOLUS
INTRAVENOUS | Status: AC
  Filled 2017-02-25: qty 20

## 2017-02-25 MED ORDER — LIDOCAINE HCL (PF) 2 % IJ SOLN
INTRAMUSCULAR | Status: AC
  Filled 2017-02-25: qty 2

## 2017-02-25 MED ORDER — INSULIN ASPART 100 UNIT/ML ~~LOC~~ SOLN
SUBCUTANEOUS | Status: AC
  Administered 2017-02-25: 1 [IU] via SUBCUTANEOUS
  Filled 2017-02-25: qty 1

## 2017-02-25 MED ORDER — PHENYLEPHRINE HCL 10 MG/ML IJ SOLN
INTRAMUSCULAR | Status: DC | PRN
  Administered 2017-02-25 (×2): 100 ug via INTRAVENOUS

## 2017-02-25 MED ORDER — SODIUM CHLORIDE 0.9 % IV SOLN
INTRAVENOUS | Status: DC
  Administered 2017-02-25: 12:00:00 via INTRAVENOUS

## 2017-02-25 MED ORDER — SODIUM CHLORIDE 0.9% FLUSH: 10.0000 mL | INTRAVENOUS | Status: DC | PRN

## 2017-02-25 MED ORDER — LIDOCAINE HCL (CARDIAC) 20 MG/ML IV SOLN
INTRAVENOUS | Status: DC | PRN
  Administered 2017-02-25: 40 mg via INTRAVENOUS

## 2017-02-25 MED ORDER — MIDAZOLAM HCL 2 MG/2ML IJ SOLN
INTRAMUSCULAR | Status: AC
Start: 2017-02-25 — End: ?
  Filled 2017-02-25: qty 2

## 2017-02-25 MED ORDER — INSULIN ASPART 100 UNIT/ML ~~LOC~~ SOLN: 0.0000 [IU] | Freq: Three times a day (TID) | SUBCUTANEOUS | Status: DC

## 2017-02-25 MED ORDER — MIDAZOLAM HCL 2 MG/2ML IJ SOLN
INTRAMUSCULAR | Status: DC | PRN
  Administered 2017-02-25 (×2): 1 mg via INTRAVENOUS

## 2017-02-25 MED ORDER — INSULIN ASPART 100 UNIT/ML ~~LOC~~ SOLN
0.0000 [IU] | Freq: Four times a day (QID) | SUBCUTANEOUS | Status: DC
  Administered 2017-02-25: 1 [IU] via SUBCUTANEOUS
  Filled 2017-02-25: qty 1

## 2017-02-25 MED ORDER — SODIUM CHLORIDE 0.9% FLUSH
10.0000 mL | Freq: Two times a day (BID) | INTRAVENOUS | Status: DC
  Administered 2017-02-25 – 2017-02-26 (×4): 10 mL

## 2017-02-25 NOTE — Anesthesia Procedure Notes (Signed)
Date/Time: 02/25/2017 12:15 PM Performed by: Doreen Salvage Pre-anesthesia Checklist: Patient identified, Emergency Drugs available, Suction available and Patient being monitored Patient Re-evaluated:Patient Re-evaluated prior to inductionOxygen Delivery Method: Nasal cannula Intubation Type: IV induction Dental Injury: Teeth and Oropharynx as per pre-operative assessment  Comments: Nasal cannula with etCO2 monitoring

## 2017-02-25 NOTE — H&P (Signed)
Jonathon Bellows MD [address], [place], [place] [address] Phone: [phone] Fax : [phone]  Primary Care Physician:  Donnie Coffin, MD Primary Gastroenterologist:  Dr. Jonathon Bellows   Pre-Procedure History & Physical: HPI:  Sophia Nelson is a 71 y.o. female is here for an endoscopy.   Past Medical History:  Diagnosis Date  . Anemia   . Arthritis   . Asthma   . Breast cancer ([place]) 2009   RT MASTECTOMY, DCIS  . Cancer of left breast ([place]) 08/09/2015   T4 N1 M1 tumor, INVASIVE LOBULAR CARCINOMA.  [name] COPD (chronic obstructive pulmonary disease) ([place])   . Diabetes mellitus without complication ([place])   . GERD (gastroesophageal reflux disease)   . Headache    h/o migraines as a child  . Hypertension   . Pneumonia 2015  . Shortness of breath dyspnea    with exertion    Past Surgical History:  Procedure Laterality Date  . ABDOMINAL HYSTERECTOMY    . BREAST SURGERY Right 2015   mastectomy  . SIMPLE MASTECTOMY WITH AXILLARY SENTINEL NODE BIOPSY Left 02/29/2016   Procedure: SIMPLE MASTECTOMY;  Surgeon: Christene Lye, MD;  Location: [facility] ORS;  Service: General;  Laterality: Left;  . TUBAL LIGATION      Prior to Admission medications   Medication Sig Start Date End Date Taking? Authorizing Provider  ADVAIR DISKUS 500-50 MCG/DOSE AEPB Inhale 1 puff into the lungs 2 (two) times daily.  05/04/15  Yes [provider]  albuterol (PROVENTIL HFA;VENTOLIN HFA) 108 (90 Base) MCG/ACT inhaler Inhale 2 puffs into the lungs every 6 (six) hours as needed for wheezing or shortness of breath.   Yes [provider]  aspirin 81 MG tablet Take 81 mg by mouth daily.   Yes [provider]  atorvastatin (LIPITOR) 10 MG tablet Take 10 mg by mouth daily.   Yes [provider]  docusate sodium (COLACE) 100 MG capsule Take 100 mg by mouth 2 (two) times daily.   Yes [provider]  fluticasone (FLONASE) 50 MCG/ACT nasal spray Place 2 sprays into  both nostrils daily.   Yes [provider]  GLIPIZIDE XL 2.5 MG 24 hr tablet Take 2.5 mg by mouth daily with breakfast.  06/23/15  Yes [provider]  hydrochlorothiazide (HYDRODIURIL) 12.5 MG tablet Take 12.5 mg by mouth daily. 02/08/17  Yes [provider]  letrozole (FEMARA) 2.5 MG tablet Take 1 tablet (2.5 mg total) by mouth daily. 11/01/16  Yes [name], MD  lisinopril (PRINIVIL,ZESTRIL) 40 MG tablet Take 40 mg by mouth every morning.  06/23/15  Yes [provider]  loratadine (CLARITIN) 10 MG tablet Take 10 mg by mouth daily.   Yes [provider]  montelukast (SINGULAIR) 10 MG tablet Take 10 mg by mouth at bedtime.   Yes [provider]  omeprazole (PRILOSEC) 20 MG capsule Take 20 mg by mouth every morning.  06/23/15  Yes [provider]  oxyCODONE-acetaminophen (PERCOCET/ROXICET) 5-325 MG tablet Take 1 tablet by mouth every 4 (four) hours as needed for severe pain.   Yes [provider]  OXYGEN Inhale 3 L into the lungs continuous.    Yes [provider]  palbociclib (IBRANCE) 125 MG capsule Take 125 mg by mouth daily with lunch.  03/14/16  Yes [provider]  SPIRIVA HANDIHALER 18 MCG inhalation capsule Place 18 mcg into inhaler and inhale every morning.  05/04/15  Yes [provider]    Allergies  as of 02/24/2017 - Review Complete 02/24/2017  Allergen Reaction Noted  . Other  02/20/2016    Family History  Problem Relation Age of Onset  . Lung cancer Father   . Breast cancer Neg Hx     Social History   Social History  . Marital status: Divorced    Spouse name: N/A  . Number of children: N/A  . Years of education: N/A   Occupational History  . Not on file.   Social History Main Topics  . Smoking status: Current Some Day Smoker    Packs/day: 0.25    Years: 15.00    Types: Cigarettes    Last attempt to quit: 02/25/2016  . Smokeless tobacco: Never Used     Comment:  currently as of 02-21-16 pt states she is only smoking 2-3 cigarettes per day  . 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 Drug use: No  . Sexual activity: Not on file   Other Topics Concern  . Not on file   Social History Narrative  . No narrative on file    Review of Systems: See HPI, otherwise negative ROS  Physical Exam: BP 98/62 (BP Location: Left Arm)   Pulse 84   Temp 98.1 F (36.7 C) (Oral)   Resp 20   Ht 5\' 2"  (1.575 m)   Wt 147 lb 12.8 oz (67 kg)   SpO2 100%   BMI 27.03 kg/m  General:   Alert,  pleasant and cooperative in NAD Head:  Normocephalic and atraumatic. Neck:  Supple; no masses or thyromegaly. Lungs:  Clear throughout to auscultation.    Heart:  Regular rate and rhythm. Abdomen:  Soft, nontender and nondistended. Normal bowel sounds, without guarding, and without rebound.   Neurologic:  Alert and  oriented x4;  grossly normal neurologically.  Impression/Plan: Sophia Nelson is here for an endoscopy to be performed for gastric outlet obstruction  Risks, benefits, limitations, and alternatives regarding  endoscopy have been reviewed with the patient.  Questions have been answered.  All parties agreeable.   Jonathon Bellows, MD  02/25/2017, 12:07 PM

## 2017-02-25 NOTE — Op Note (Signed)
[facility] Gastroenterology Patient Name: Jeanette Rauth Procedure Date: 02/25/2017 12:16 PM MRN: [id number] Account #: [id number] Date of Birth: 16-Jun-1946 Admit Type: Inpatient Age: 71 Room: [facility] ENDO ROOM 4 Gender: Female Note Status: Finalized Procedure:            Upper GI endoscopy Indications:          Abnormal CT of the GI tract Providers:            Jonathon Bellows MD, MD Referring MD:         Edmonia Lynch. Aycock MD (Referring MD) Medicines:            Monitored Anesthesia Care Complications:        No immediate complications. Procedure:            Pre-Anesthesia Assessment:                       - ASA Grade Assessment: III - A patient with severe                        systemic disease.                       - Prior to the procedure, a History and Physical was                        performed, and patient medications, allergies and                        sensitivities were reviewed. The patient's tolerance of                        previous anesthesia was reviewed.                       - The risks and benefits of the procedure and the                        sedation options and risks were discussed with the                        patient. All questions were answered and informed                        consent was obtained.                       After obtaining informed consent, the endoscope was                        passed under direct vision. Throughout the procedure,                        the patient's blood pressure, pulse, and oxygen                        saturations were monitored continuously. The Endoscope                        was introduced through the mouth, and advanced to the  jejunum. The upper GI endoscopy was accomplished with                        ease. The patient tolerated the procedure well. Findings:      The esophagus was normal.      A medium amount of food (residue) was found in the gastric fundus. Large   qty of seeds seen appeared from a [place].      The examined duodenum was normal.      The lumen appeared normal , there was a moderate qty of fibrinous food       material . The lumen did appeared collapsed but no obvious obstruction       given limited visualization. Impression:           - Normal esophagus.                       - A medium amount of food (residue) in the stomach.                       - Normal examined duodenum.                       - The lumen appeared normal , there was a moderate qty                        of fibrinous food material . The lumen did appeared                        collapsed but no obvious obstruction given limited                        visualization.                       - No specimens collected. Recommendation:       - Return patient to hospital ward for ongoing care.                       - Clear liquid diet today.                       - Continue present medications.                       - 1. Commence on clear liquid diet with no fiber                       2. Consider Reglan to help with motility which appears                        to be impaired                       3. Once this acute episode resolves I suggest an out                        patient CT enterography to ensure there is no extra                        luminal lesion causing pressure on the duodenum/jejunum  leading to compression Procedure Code(s):    --- Professional ---                       [phone], Esophagogastroduodenoscopy, flexible, transoral;                        diagnostic, including collection of specimen(s) by                        brushing or washing, when performed (separate procedure) Diagnosis Code(s):    --- Professional ---                       R93.3, Abnormal findings on diagnostic imaging of other                        parts of digestive tract CPT copyright 2016 American Medical Association. All rights reserved. The codes  documented in this report are preliminary and upon coder review may  be revised to meet current compliance requirements. Jonathon Bellows, MD Jonathon Bellows MD, MD 02/25/2017 12:42:49 PM This report has been signed electronically. Number of Addenda: 0 Note Initiated On: 02/25/2017 12:16 PM      [facility]

## 2017-02-25 NOTE — Progress Notes (Signed)
[place] at [facility]                                                                                                                                                                                  Patient Demographics   Sophia Nelson, is a 71 y.o. female, DOB - March 12, 1946, KPV:[id number]  Admit date - 02/24/2017   Admitting Physician Lance Coon, MD  Outpatient Primary MD for the patient is Aycock, Ngwe A, MD   LOS - 1  Subjective: Pt had egd showed food particles  Pt feels little better now    Review of Systems:   CONSTITUTIONAL: No documented fever. No fatigue, weakness. No weight gain, no weight loss.  EYES: No blurry or double vision.  ENT: No tinnitus. No postnasal drip. No redness of the oropharynx.  RESPIRATORY: No cough, no wheeze, no hemoptysis. No dyspnea.  CARDIOVASCULAR: No chest pain. No orthopnea. No palpitations. No syncope.  GASTROINTESTINAL: No nausea, no vomiting or diarrhea. + abdominal pain. No melena or hematochezia.  GENITOURINARY: No dysuria or hematuria.  ENDOCRINE: No polyuria or nocturia. No heat or cold intolerance.  HEMATOLOGY: No anemia. No bruising. No bleeding.  INTEGUMENTARY: No rashes. No lesions.  MUSCULOSKELETAL: No arthritis. No swelling. No gout.  NEUROLOGIC: No numbness, tingling, or ataxia. No seizure-type activity.  PSYCHIATRIC: No anxiety. No insomnia. No ADD.    Vitals:   Vitals:   02/25/17 1255 02/25/17 1305 02/25/17 1315 02/25/17 1325  BP: (!) 93/46 (!) 93/44 (!) 87/41 (!) 95/44  Pulse: 91 87 83 82  Resp: 17 14 15 15   Temp:      TempSrc:      SpO2: 95% 96% 98% 99%  Weight:      Height:        Wt Readings from Last 3 Encounters:  02/25/17 147 lb 12.8 oz (67 kg)  02/13/17 152 lb 1.6 oz (69 kg)  12/12/16 156 lb 4 oz (70.9 kg)     Intake/Output Summary (Last 24 hours) at 02/25/17 1543 Last data filed at 02/25/17 1242  Gross per 24 hour  Intake          1868.33 ml  Output             1600  ml  Net           268.33 ml    Physical Exam:   GENERAL: Pleasant-appearing in no apparent distress.  HEAD, EYES, EARS, NOSE AND THROAT: Atraumatic, normocephalic. Extraocular muscles are intact. Pupils equal and reactive to light. Sclerae anicteric. No conjunctival injection. No oro-pharyngeal erythema.  NECK: Supple. There is no jugular venous distention. No bruits, no lymphadenopathy, no thyromegaly.  HEART: Regular rate and rhythm,. No murmurs, no rubs, no clicks.  LUNGS: Clear to auscultation bilaterally. No rales or rhonchi. No wheezes.  ABDOMEN: Soft, flat,mild abdomen distention Has good bowel sounds. No hepatosplenomegaly appreciated.  EXTREMITIES: No evidence of any cyanosis, clubbing, or peripheral edema.  +2 pedal and radial pulses bilaterally.  NEUROLOGIC: The patient is alert, awake, and oriented x3 with no focal motor or sensory deficits appreciated bilaterally.  SKIN: Moist and warm with no rashes appreciated.  Psych: Not anxious, depressed LN: No inguinal LN enlargement    Antibiotics   Anti-infectives    None      Medications   Scheduled Meds: . heparin  5,000 Units Subcutaneous Q8H  . insulin aspart  0-9 Units Subcutaneous Q6H  . mouth rinse  15 mL Mouth Rinse BID  . metoCLOPramide (REGLAN) injection  10 mg Intravenous Q8H  . mometasone-formoterol  2 puff Inhalation BID  . pantoprazole (PROTONIX) IV  40 mg Intravenous Q24H  . sodium chloride flush  10-40 mL Intracatheter Q12H  . tiotropium  18 mcg Inhalation BH-q7a   Continuous Infusions: PRN Meds:.acetaminophen **OR** acetaminophen, morphine injection, ondansetron **OR** ondansetron (ZOFRAN) IV, sodium chloride flush   Data Review:   Micro Results No results found for this or any previous visit (from the past 240 hour(s)).  Radiology Reports Ct Abdomen Pelvis Wo Contrast  Result Date: 02/24/2017 CLINICAL DATA:  Initial evaluation for acute generalized lower/ mid abdominal pain with nausea and  vomiting. Concern for obstruction. EXAM: CT CHEST, ABDOMEN AND PELVIS WITHOUT CONTRAST TECHNIQUE: Multidetector CT imaging of the chest, abdomen and pelvis was performed following the standard protocol without IV contrast. COMPARISON:  Prior CT from 11/02/2016. FINDINGS: CT CHEST FINDINGS Cardiovascular: Intrathoracic aorta of normal caliber without aneurysm. Moderate aortic atherosclerosis. Partially visualized great vessels grossly within normal limits. Heart size normal. Three vessel coronary artery calcifications. No pericardial effusion. Mediastinum/Nodes: Thyroid normal. No pathologically enlarged mediastinal, hilar, or axillary lymph nodes identified. Esophagus within normal limits. Lungs/Pleura: Tracheobronchial tree is patent. Upper lobe predominant emphysema. Linear atelectasis present within the lingula and left lower lobe. Lungs are otherwise clear. No focal infiltrates. No pulmonary edema or pleural effusion. No pneumothorax. No worrisome pulmonary nodule or mass. Musculoskeletal: No acute osseus abnormality. No worrisome lytic or blastic osseous lesions. CT ABDOMEN PELVIS FINDINGS Hepatobiliary: Limited noncontrast evaluation of the liver is unremarkable. Punctate calcification within the gallbladder may reflect a small stone versus mural calcification. No evidence for acute cholecystitis. No biliary dilatation. Pancreas: Pancreas within normal limits. Spleen: Spleen within normal limits. Adrenals/Urinary Tract: Adrenal glands within normal limits. Kidneys equal in size without evidence for nephrolithiasis or hydronephrosis. No hydroureter. Bladder partially distended and within normal limits. Stomach/Bowel: Prominent gastric distension. Additionally, there is mild distention of the duodenum to the level of the ligament of Treitz. No obvious obstructive mass identified, although an underlying obstructive lesion could conceivably be present. Distally, small bowel is decompressed and of normal caliber.  Appendix normal. Colon is diffusely decompressed. No other acute inflammatory changes seen about the bowels. Vascular/Lymphatic: Moderate aorto bi-iliac atherosclerotic disease. No aneurysm. No appreciable adenopathy on this noncontrast examination. Reproductive: Uterus is not absent. Ovaries not discretely identified. Other: No free air or fluid. Musculoskeletal: No acute osseous abnormality. No worrisome lytic or blastic osseous lesions. IMPRESSION: 1. Prominent distension of the stomach and duodenum to the level of the ligament of Treitz, with decompression of the small bowel distally. While this finding may be transient and physiologic in nature, a possible  proximal obstructive process could be considered in the correct clinical setting. No obvious mass or other abnormality identified on this noncontrast examination. Correlation with symptomatology recommended. Additionally, GI consultation with possible further evaluation with upper endoscopy could be considered as clinically warranted. 2. No other acute abnormality within the chest, abdomen, and pelvis. 3. Subcentimeter calcification within the gallbladder, which may reflect a small stone or possibly mural calcification. 4. Emphysema. 5. Moderate atherosclerosis with diffuse 3 vessel coronary artery calcifications. 6. Electronically Signed   By: Jeannine Boga M.D.   On: 02/24/2017 20:45   Ct Chest Wo Contrast  Result Date: 02/24/2017 CLINICAL DATA:  Initial evaluation for acute generalized lower/ mid abdominal pain with nausea and vomiting. Concern for obstruction. EXAM: CT CHEST, ABDOMEN AND PELVIS WITHOUT CONTRAST TECHNIQUE: Multidetector CT imaging of the chest, abdomen and pelvis was performed following the standard protocol without IV contrast. COMPARISON:  Prior CT from 11/02/2016. FINDINGS: CT CHEST FINDINGS Cardiovascular: Intrathoracic aorta of normal caliber without aneurysm. Moderate aortic atherosclerosis. Partially visualized great  vessels grossly within normal limits. Heart size normal. Three vessel coronary artery calcifications. No pericardial effusion. Mediastinum/Nodes: Thyroid normal. No pathologically enlarged mediastinal, hilar, or axillary lymph nodes identified. Esophagus within normal limits. Lungs/Pleura: Tracheobronchial tree is patent. Upper lobe predominant emphysema. Linear atelectasis present within the lingula and left lower lobe. Lungs are otherwise clear. No focal infiltrates. No pulmonary edema or pleural effusion. No pneumothorax. No worrisome pulmonary nodule or mass. Musculoskeletal: No acute osseus abnormality. No worrisome lytic or blastic osseous lesions. CT ABDOMEN PELVIS FINDINGS Hepatobiliary: Limited noncontrast evaluation of the liver is unremarkable. Punctate calcification within the gallbladder may reflect a small stone versus mural calcification. No evidence for acute cholecystitis. No biliary dilatation. Pancreas: Pancreas within normal limits. Spleen: Spleen within normal limits. Adrenals/Urinary Tract: Adrenal glands within normal limits. Kidneys equal in size without evidence for nephrolithiasis or hydronephrosis. No hydroureter. Bladder partially distended and within normal limits. Stomach/Bowel: Prominent gastric distension. Additionally, there is mild distention of the duodenum to the level of the ligament of Treitz. No obvious obstructive mass identified, although an underlying obstructive lesion could conceivably be present. Distally, small bowel is decompressed and of normal caliber. Appendix normal. Colon is diffusely decompressed. No other acute inflammatory changes seen about the bowels. Vascular/Lymphatic: Moderate aorto bi-iliac atherosclerotic disease. No aneurysm. No appreciable adenopathy on this noncontrast examination. Reproductive: Uterus is not absent. Ovaries not discretely identified. Other: No free air or fluid. Musculoskeletal: No acute osseous abnormality. No worrisome lytic or  blastic osseous lesions. IMPRESSION: 1. Prominent distension of the stomach and duodenum to the level of the ligament of Treitz, with decompression of the small bowel distally. While this finding may be transient and physiologic in nature, a possible proximal obstructive process could be considered in the correct clinical setting. No obvious mass or other abnormality identified on this noncontrast examination. Correlation with symptomatology recommended. Additionally, GI consultation with possible further evaluation with upper endoscopy could be considered as clinically warranted. 2. No other acute abnormality within the chest, abdomen, and pelvis. 3. Subcentimeter calcification within the gallbladder, which may reflect a small stone or possibly mural calcification. 4. Emphysema. 5. Moderate atherosclerosis with diffuse 3 vessel coronary artery calcifications. 6. Electronically Signed   By: Jeannine Boga M.D.   On: 02/24/2017 20:45   Dg Chest Portable 1 View  Result Date: 02/24/2017 CLINICAL DATA:  Initial evaluation for central line placement. EXAM: PORTABLE CHEST 1 VIEW COMPARISON:  Prior CT from 11/02/2016. FINDINGS: Right IJ approach  centra venous catheter in place with tip overlying the distal SVC. Cardiac mediastinal silhouettes are within normal limits. Aortic atherosclerosis. Lungs mildly hypoinflated. Mild subsegmental atelectasis at the bilateral lung bases. No focal infiltrates. No pulmonary edema or pleural effusion. No pneumothorax. No acute osseus abnormality. IMPRESSION: 1. Right IJ approach centra venous catheter in place with tip overlying the distal SVC. 2. Shallow lung inflation with mild bibasilar subsegmental atelectasis. 3. No other active cardiopulmonary disease. 4. Aortic atherosclerosis. Electronically Signed   By: Jeannine Boga M.D.   On: 02/24/2017 18:49   Dg Abd Portable 1 View  Result Date: 02/24/2017 CLINICAL DATA:  Nasogastric tube advancement.  Initial encounter.  EXAM: PORTABLE ABDOMEN - 1 VIEW COMPARISON:  Abdominal radiograph performed earlier today at 9:43 p.m. FINDINGS: The patient's enteric tube is seen ending overlying the fundus of the stomach, with the side port about the fundus of the stomach. The visualized bowel gas pattern is grossly unremarkable. No free intra-abdominal air is seen, though evaluation for free air on a supine radiograph is limited. The visualized lung bases are clear. Mild degenerative change is noted at the lower lumbar spine. Mild sclerosis is seen at the sacroiliac joints. IMPRESSION: Enteric tube noted ending overlying the fundus of the stomach. Electronically Signed   By: Garald Balding M.D.   On: 02/24/2017 22:44   Dg Abd Portable 1 View  Result Date: 02/24/2017 CLINICAL DATA:  Evaluate NG tube placement. EXAM: PORTABLE ABDOMEN - 1 VIEW COMPARISON:  None FINDINGS: The side port of the NG tube is 7 cm above the GE junction with the tip at the GE junction. No other abnormalities. IMPRESSION: The side-port of the NG tube is 7 cm above the GE junction with the tip at the GE junction. Recommend advancement. Electronically Signed   By: Dorise Bullion III M.D   On: 02/24/2017 21:55     CBC  Recent Labs Lab 02/24/17 1602 02/25/17 0450  WBC 20.1* 15.3*  HGB 12.1 9.7*  HCT 36.0 29.2*  PLT 611* 468*  MCV 82.6 83.3  MCH 27.7 27.6  MCHC 33.6 33.1  RDW 18.1* 18.1*    Chemistries   Recent Labs Lab 02/24/17 1630 02/25/17 0450  NA 136 139  K 3.9 3.5  CL 88* 97*  CO2 31 29  GLUCOSE 136* 117*  BUN 39* 44*  CREATININE 3.65* 2.64*  CALCIUM 9.7 8.6*  AST 21  --   ALT 9*  --   ALKPHOS 94  --   BILITOT 0.9  --    ------------------------------------------------------------------------------------------------------------------ estimated creatinine clearance is 18.3 mL/min (A) (by C-G formula based on SCr of 2.64 mg/dL  (H)). ------------------------------------------------------------------------------------------------------------------ No results for input(s): HGBA1C in the last 72 hours. ------------------------------------------------------------------------------------------------------------------ No results for input(s): CHOL, HDL, LDLCALC, TRIG, CHOLHDL, LDLDIRECT in the last 72 hours. ------------------------------------------------------------------------------------------------------------------ No results for input(s): TSH, T4TOTAL, T3FREE, THYROIDAB in the last 72 hours.  Invalid input(s): FREET3 ------------------------------------------------------------------------------------------------------------------ No results for input(s): VITAMINB12, FOLATE, FERRITIN, TIBC, IRON, RETICCTPCT in the last 72 hours.  Coagulation profile No results for input(s): INR, PROTIME in the last 168 hours.  No results for input(s): DDIMER in the last 72 hours.  Cardiac Enzymes  Recent Labs Lab 02/24/17 1630  TROPONINI <0.03   ------------------------------------------------------------------------------------------------------------------ Invalid input(s): POCBNP    Assessment & Plan   Pt is 71 y.o with abdominal pain  1. Abdominal pain due to likely gastroparesis, S/p egd Start pt on reglan iv  2.   AKI (acute kidney injury) ([place]) - likely due  to her vomiting and dehydration.improved with ivf  3.   Diabetes ([place]) - sliding scale insulin with corresponding glucose checks cehck hgba1c  4.  COPD (chronic obstructive pulmonary disease) ([place]) - continue home meds, on chronic oxgyen   5.  GERD (gastroesophageal reflux disease) - PPI      Code Status Orders        Start     Ordered   02/24/17 2341  Full code  Continuous     02/24/17 2340    Code Status History    Date Active Date Inactive Code Status Order ID Comments User Context   This patient has a current code status but no  historical code status.           Consults  gi   DVT Prophylaxis  heparin  Lab Results  Component Value Date   PLT 468 (H) 02/25/2017     Time Spent in minutes   54min  Greater than 50% of time spent in care coordination and counseling patient regarding the condition and plan of care.   Dustin Flock M.D on 02/25/2017 at 3:43 PM  Between 7am to 6pm - Pager - [phone]  After 6pm go to [email or web address] - password [email or web address] [facility]   Office  [phone]

## 2017-02-25 NOTE — Transfer of Care (Signed)
Immediate Anesthesia Transfer of Care Note  Patient: Sophia Nelson  Procedure(s) Performed: Procedure(s): ESOPHAGOGASTRODUODENOSCOPY (EGD) (N/A)  Patient Location: PACU and Endoscopy Unit  Anesthesia Type:General  Level of Consciousness: sedated  Airway & Oxygen Therapy: Patient Spontanous Breathing and Patient connected to nasal cannula oxygen  Post-op Assessment: Report given to RN and Post -op Vital signs reviewed and stable  Post vital signs: Reviewed and stable  Last Vitals:  Vitals:   02/25/17 1209 02/25/17 1245  BP: 109/63 (!) 90/46  Pulse: 84 97  Resp: 18 20  Temp: 36.2 C 10.0 C    Complications: No apparent anesthesia complications

## 2017-02-25 NOTE — Anesthesia Postprocedure Evaluation (Signed)
Anesthesia Post Note  Patient: Sophia Nelson  Procedure(s) Performed: Procedure(s) (LRB): ESOPHAGOGASTRODUODENOSCOPY (EGD) (N/A)  Patient location during evaluation: Endoscopy Anesthesia Type: General Level of consciousness: awake and alert Pain management: pain level controlled Vital Signs Assessment: post-procedure vital signs reviewed and stable Respiratory status: spontaneous breathing, nonlabored ventilation, respiratory function stable and patient connected to nasal cannula oxygen Cardiovascular status: blood pressure returned to baseline and stable Postop Assessment: no signs of nausea or vomiting Anesthetic complications: no     Last Vitals:  Vitals:   02/25/17 1245 02/25/17 1255  BP: (!) 90/46 (!) 93/46  Pulse: 97 91  Resp: 20 17  Temp: 36.5 C     Last Pain:  Vitals:   02/25/17 1245  TempSrc: Tympanic  PainSc:                  Blessing Ozga S

## 2017-02-25 NOTE — Anesthesia Post-op Follow-up Note (Cosign Needed)
Anesthesia QCDR form completed.        

## 2017-02-25 NOTE — Progress Notes (Signed)
Patient arrived to 1C Room 117. Patient denies pain and all questions answered. Patient oriented to unit and use of call bell/room phone. Skin assessment completed with Silva Bandy RN and skin intact. A&Ox4 and VSS. NG tube set to low-intermittent suction and draining dark green bile. Nursing staff will continue to monitor for any changes in patient status. Earleen Reaper, RN

## 2017-02-25 NOTE — Anesthesia Preprocedure Evaluation (Addendum)
Anesthesia Evaluation  Patient identified by MRN, date of birth, ID band Patient awake    Reviewed: Allergy & Precautions, NPO status , Patient's Chart, lab work & pertinent test results, reviewed documented beta blocker date and time   Airway Mallampati: II  TM Distance: >3 FB     Dental  (+) Chipped, Upper Dentures, Partial Lower   Pulmonary shortness of breath, asthma , pneumonia, resolved, COPD, Current Smoker,           Cardiovascular hypertension, Pt. on medications      Neuro/Psych  Headaches,    GI/Hepatic GERD  ,  Endo/Other  diabetes, Type 2  Renal/GU CRFRenal disease     Musculoskeletal   Abdominal   Peds  Hematology  (+) anemia ,   Anesthesia Other Findings Hb 9.7.  Reproductive/Obstetrics                           Anesthesia Physical Anesthesia Plan  ASA: III  Anesthesia Plan: General   Post-op Pain Management:    Induction: Intravenous  PONV Risk Score and Plan:   Airway Management Planned:   Additional Equipment:   Intra-op Plan:   Post-operative Plan:   Informed Consent: I have reviewed the patients History and Physical, chart, labs and discussed the procedure including the risks, benefits and alternatives for the proposed anesthesia with the patient or authorized representative who has indicated his/her understanding and acceptance.     Plan Discussed with: CRNA  Anesthesia Plan Comments:         Anesthesia Quick Evaluation

## 2017-02-25 NOTE — Consult Note (Signed)
Jonathon Bellows MD, MRCP(U.K) [address]  [place]  [place], [place] [address]  Main: [phone]  Fax: [phone]  Consultation  Referring Provider:     Dr Jannifer Franklin Primary Care Physician:  Donnie Coffin, MD Primary Gastroenterologist:           Reason for Consultation:     Gastric outlet obstruction  Date of Admission:  02/24/2017 Date of Consultation:  02/25/2017         HPI:   Sophia Nelson is a 71 y.o. female admitted yesterday with abdominal pain and vomiting of 2 days duration.   CT scan of the abdomen showed distension of the stomach to the level of ligament of Treitz.   She says she was doing well till about two days back when all of a sudden she had lower abdominal pain , vomiting and threw up almost 30 times which has stopped after coming into the hospital. Denies any blood. She has a NG tube connected to suction which shows green bilious liquid . Denies any NSAID use or prior similar episodes. No history of diarrhea.   Past Medical History:  Diagnosis Date  . Anemia   . Arthritis   . Asthma   . 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  [name] COPD (chronic obstructive pulmonary disease) ([place])   . Diabetes mellitus without complication ([place])   . GERD (gastroesophageal reflux disease)   . Headache    h/o migraines as a child  . Hypertension   . Pneumonia 2015  . Shortness of breath dyspnea    with exertion    Past Surgical History:  Procedure Laterality Date  . ABDOMINAL HYSTERECTOMY    . BREAST SURGERY Right 2015   mastectomy  . SIMPLE MASTECTOMY WITH AXILLARY SENTINEL NODE BIOPSY Left 02/29/2016   Procedure: SIMPLE MASTECTOMY;  Surgeon: Christene Lye, MD;  Location: [facility] ORS;  Service: General;  Laterality: Left;  . TUBAL LIGATION      Prior to Admission medications   Medication Sig Start Date End Date Taking? Authorizing Provider  ADVAIR DISKUS  500-50 MCG/DOSE AEPB Inhale 1 puff into the lungs 2 (two) times daily.  05/04/15  Yes [provider]  albuterol (PROVENTIL HFA;VENTOLIN HFA) 108 (90 Base) MCG/ACT inhaler Inhale 2 puffs into the lungs every 6 (six) hours as needed for wheezing or shortness of breath.   Yes [provider]  aspirin 81 MG tablet Take 81 mg by mouth daily.   Yes [provider]  atorvastatin (LIPITOR) 10 MG tablet Take 10 mg by mouth daily.   Yes [provider]  docusate sodium (COLACE) 100 MG capsule Take 100 mg by mouth 2 (two) times daily.   Yes [provider]  fluticasone (FLONASE) 50 MCG/ACT nasal spray Place 2 sprays into both nostrils daily.   Yes [provider]  GLIPIZIDE XL 2.5 MG 24 hr tablet Take 2.5 mg by mouth daily with breakfast.  06/23/15  Yes [provider]  hydrochlorothiazide (HYDRODIURIL) 12.5 MG tablet Take 12.5 mg by mouth daily. 02/08/17  Yes [provider]  letrozole (FEMARA) 2.5 MG tablet Take 1 tablet (2.5 mg total) by mouth daily. 11/01/16  Yes [name], MD  lisinopril (PRINIVIL,ZESTRIL) 40 MG tablet Take 40 mg by mouth every morning.  06/23/15  Yes [provider]  loratadine (CLARITIN) 10 MG tablet Take 10  mg by mouth daily.   Yes [provider]  montelukast (SINGULAIR) 10 MG tablet Take 10 mg by mouth at bedtime.   Yes [provider]  omeprazole (PRILOSEC) 20 MG capsule Take 20 mg by mouth every morning.  06/23/15  Yes [provider]  oxyCODONE-acetaminophen (PERCOCET/ROXICET) 5-325 MG tablet Take 1 tablet by mouth every 4 (four) hours as needed for severe pain.   Yes [provider]  OXYGEN Inhale 3 L into the lungs continuous.    Yes [provider]  palbociclib (IBRANCE) 125 MG capsule Take 125 mg by mouth daily with lunch.  03/14/16  Yes [provider]  SPIRIVA HANDIHALER 18 MCG inhalation capsule Place 18 mcg into inhaler and inhale  every morning.  05/04/15  Yes [provider]    Family History  Problem Relation Age of Onset  . Lung cancer Father   . Breast cancer Neg Hx      Social History  Substance Use Topics  . Smoking status: Current Some Day Smoker    Packs/day: 0.25    Years: 15.00    Types: Cigarettes    Last attempt to quit: 02/25/2016  . Smokeless tobacco: Never Used     Comment: currently as of 02-21-16 pt states she is only smoking 2-3 cigarettes per day  . 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 Other  02/20/2016    Review of Systems:    All systems reviewed and negative except where noted in HPI.   Physical Exam:  Vital signs in last 24 hours: Temp:  [98.1 F (36.7 C)-99.1 F (37.3 C)] 98.1 F (36.7 C) (07/02 0443) Pulse Rate:  [78-100] 84 (07/02 0443) Resp:  [12-21] 20 (07/02 0443) BP: (98-138)/(42-83) 98/62 (07/02 0443) SpO2:  [96 %-100 %] 100 % (07/02 0443) Weight:  [147 lb 12.8 oz (67 kg)-151 lb (68.5 kg)] 147 lb 12.8 oz (67 kg) (07/02 0013) Last BM Date: 02/22/17 General:   Pleasant, cooperative in NAD Head:  Normocephalic and atraumatic. Eyes:   No icterus.   Conjunctiva pink. PERRLA. Ears:  Normal auditory acuity. Neck:  Supple; no masses or thyroidomegaly Lungs: Respirations even and unlabored. Lungs clear to auscultation bilaterally.   No wheezes, crackles, or rhonchi.  Heart:  Regular rate and rhythm;  Without murmur, clicks, rubs or gallops Abdomen:  Soft, nondistended, nontender. Normal bowel sounds. No appreciable masses or hepatomegaly.  No rebound or guarding.  Rectal:  Not performed. Neurologic:  Alert and oriented x3;  grossly normal neurologically. Skin:  Intact without significant lesions or rashes. Cervical Nodes:  No significant cervical adenopathy. Psych:  Alert and  cooperative. Normal affect.  LAB RESULTS:  Recent Labs  02/24/17 1602 02/25/17 0450  WBC 20.1* 15.3*  HGB 12.1 9.7*  HCT 36.0 29.2*  PLT 611* 468*   BMET  Recent Labs  02/24/17 1630 02/25/17 0450  NA 136 139  K 3.9 3.5  CL 88* 97*  CO2 31 29  GLUCOSE 136* 117*  BUN 39* 44*  CREATININE 3.65* 2.64*  CALCIUM 9.7 8.6*   LFT  Recent Labs  02/24/17 1630  PROT 8.9*  ALBUMIN 4.5  AST 21  ALT 9*  ALKPHOS 94  BILITOT 0.9   PT/INR No results for input(s): LABPROT, INR in the last  72 hours.  STUDIES: Ct Abdomen Pelvis Wo Contrast  Result Date: 02/24/2017 CLINICAL DATA:  Initial evaluation for acute generalized lower/ mid abdominal pain with nausea and vomiting. Concern for obstruction. EXAM: CT CHEST, ABDOMEN AND PELVIS WITHOUT CONTRAST TECHNIQUE: Multidetector CT imaging of the chest, abdomen and pelvis was performed following the standard protocol without IV contrast. COMPARISON:  Prior CT from 11/02/2016. FINDINGS: CT CHEST FINDINGS Cardiovascular: Intrathoracic aorta of normal caliber without aneurysm. Moderate aortic atherosclerosis. Partially visualized great vessels grossly within normal limits. Heart size normal. Three vessel coronary artery calcifications. No pericardial effusion. Mediastinum/Nodes: Thyroid normal. No pathologically enlarged mediastinal, hilar, or axillary lymph nodes identified. Esophagus within normal limits. Lungs/Pleura: Tracheobronchial tree is patent. Upper lobe predominant emphysema. Linear atelectasis present within the lingula and left lower lobe. Lungs are otherwise clear. No focal infiltrates. No pulmonary edema or pleural effusion. No pneumothorax. No worrisome pulmonary nodule or mass. Musculoskeletal: No acute osseus abnormality. No worrisome lytic or blastic osseous lesions. CT ABDOMEN PELVIS FINDINGS Hepatobiliary: Limited noncontrast evaluation of the liver is unremarkable. Punctate calcification within the gallbladder may reflect a small  stone versus mural calcification. No evidence for acute cholecystitis. No biliary dilatation. Pancreas: Pancreas within normal limits. Spleen: Spleen within normal limits. Adrenals/Urinary Tract: Adrenal glands within normal limits. Kidneys equal in size without evidence for nephrolithiasis or hydronephrosis. No hydroureter. Bladder partially distended and within normal limits. Stomach/Bowel: Prominent gastric distension. Additionally, there is mild distention of the duodenum to the level of the ligament of Treitz. No obvious obstructive mass identified, although an underlying obstructive lesion could conceivably be present. Distally, small bowel is decompressed and of normal caliber. Appendix normal. Colon is diffusely decompressed. No other acute inflammatory changes seen about the bowels. Vascular/Lymphatic: Moderate aorto bi-iliac atherosclerotic disease. No aneurysm. No appreciable adenopathy on this noncontrast examination. Reproductive: Uterus is not absent. Ovaries not discretely identified. Other: No free air or fluid. Musculoskeletal: No acute osseous abnormality. No worrisome lytic or blastic osseous lesions. IMPRESSION: 1. Prominent distension of the stomach and duodenum to the level of the ligament of Treitz, with decompression of the small bowel distally. While this finding may be transient and physiologic in nature, a possible proximal obstructive process could be considered in the correct clinical setting. No obvious mass or other abnormality identified on this noncontrast examination. Correlation with symptomatology recommended. Additionally, GI consultation with possible further evaluation with upper endoscopy could be considered as clinically warranted. 2. No other acute abnormality within the chest, abdomen, and pelvis. 3. Subcentimeter calcification within the gallbladder, which may reflect a small stone or possibly mural calcification. 4. Emphysema. 5. Moderate atherosclerosis with diffuse 3  vessel coronary artery calcifications. 6. Electronically Signed   By: Jeannine Boga M.D.   On: 02/24/2017 20:45   Ct Chest Wo Contrast  Result Date: 02/24/2017 CLINICAL DATA:  Initial evaluation for acute generalized lower/ mid abdominal pain with nausea and vomiting. Concern for obstruction. EXAM: CT CHEST, ABDOMEN AND PELVIS WITHOUT CONTRAST TECHNIQUE: Multidetector CT imaging of the chest, abdomen and pelvis was performed following the standard protocol without IV contrast. COMPARISON:  Prior CT from 11/02/2016. FINDINGS: CT CHEST FINDINGS Cardiovascular: Intrathoracic aorta of normal caliber without aneurysm. Moderate aortic atherosclerosis. Partially visualized great vessels grossly within normal limits. Heart size normal. Three vessel coronary artery calcifications. No pericardial effusion. Mediastinum/Nodes: Thyroid normal. No pathologically enlarged mediastinal, hilar, or axillary lymph nodes identified. Esophagus within normal limits. Lungs/Pleura: Tracheobronchial tree is patent. Upper lobe predominant emphysema. Linear atelectasis present within the lingula  and left lower lobe. Lungs are otherwise clear. No focal infiltrates. No pulmonary edema or pleural effusion. No pneumothorax. No worrisome pulmonary nodule or mass. Musculoskeletal: No acute osseus abnormality. No worrisome lytic or blastic osseous lesions. CT ABDOMEN PELVIS FINDINGS Hepatobiliary: Limited noncontrast evaluation of the liver is unremarkable. Punctate calcification within the gallbladder may reflect a small stone versus mural calcification. No evidence for acute cholecystitis. No biliary dilatation. Pancreas: Pancreas within normal limits. Spleen: Spleen within normal limits. Adrenals/Urinary Tract: Adrenal glands within normal limits. Kidneys equal in size without evidence for nephrolithiasis or hydronephrosis. No hydroureter. Bladder partially distended and within normal limits. Stomach/Bowel: Prominent gastric distension.  Additionally, there is mild distention of the duodenum to the level of the ligament of Treitz. No obvious obstructive mass identified, although an underlying obstructive lesion could conceivably be present. Distally, small bowel is decompressed and of normal caliber. Appendix normal. Colon is diffusely decompressed. No other acute inflammatory changes seen about the bowels. Vascular/Lymphatic: Moderate aorto bi-iliac atherosclerotic disease. No aneurysm. No appreciable adenopathy on this noncontrast examination. Reproductive: Uterus is not absent. Ovaries not discretely identified. Other: No free air or fluid. Musculoskeletal: No acute osseous abnormality. No worrisome lytic or blastic osseous lesions. IMPRESSION: 1. Prominent distension of the stomach and duodenum to the level of the ligament of Treitz, with decompression of the small bowel distally. While this finding may be transient and physiologic in nature, a possible proximal obstructive process could be considered in the correct clinical setting. No obvious mass or other abnormality identified on this noncontrast examination. Correlation with symptomatology recommended. Additionally, GI consultation with possible further evaluation with upper endoscopy could be considered as clinically warranted. 2. No other acute abnormality within the chest, abdomen, and pelvis. 3. Subcentimeter calcification within the gallbladder, which may reflect a small stone or possibly mural calcification. 4. Emphysema. 5. Moderate atherosclerosis with diffuse 3 vessel coronary artery calcifications. 6. Electronically Signed   By: Jeannine Boga M.D.   On: 02/24/2017 20:45   Dg Chest Portable 1 View  Result Date: 02/24/2017 CLINICAL DATA:  Initial evaluation for central line placement. EXAM: PORTABLE CHEST 1 VIEW COMPARISON:  Prior CT from 11/02/2016. FINDINGS: Right IJ approach centra venous catheter in place with tip overlying the distal SVC. Cardiac mediastinal  silhouettes are within normal limits. Aortic atherosclerosis. Lungs mildly hypoinflated. Mild subsegmental atelectasis at the bilateral lung bases. No focal infiltrates. No pulmonary edema or pleural effusion. No pneumothorax. No acute osseus abnormality. IMPRESSION: 1. Right IJ approach centra venous catheter in place with tip overlying the distal SVC. 2. Shallow lung inflation with mild bibasilar subsegmental atelectasis. 3. No other active cardiopulmonary disease. 4. Aortic atherosclerosis. Electronically Signed   By: Jeannine Boga M.D.   On: 02/24/2017 18:49   Dg Abd Portable 1 View  Result Date: 02/24/2017 CLINICAL DATA:  Nasogastric tube advancement.  Initial encounter. EXAM: PORTABLE ABDOMEN - 1 VIEW COMPARISON:  Abdominal radiograph performed earlier today at 9:43 p.m. FINDINGS: The patient's enteric tube is seen ending overlying the fundus of the stomach, with the side port about the fundus of the stomach. The visualized bowel gas pattern is grossly unremarkable. No free intra-abdominal air is seen, though evaluation for free air on a supine radiograph is limited. The visualized lung bases are clear. Mild degenerative change is noted at the lower lumbar spine. Mild sclerosis is seen at the sacroiliac joints. IMPRESSION: Enteric tube noted ending overlying the fundus of the stomach. Electronically Signed   By: Francoise Schaumann.D.  On: 02/24/2017 22:44   Dg Abd Portable 1 View  Result Date: 02/24/2017 CLINICAL DATA:  Evaluate NG tube placement. EXAM: PORTABLE ABDOMEN - 1 VIEW COMPARISON:  None FINDINGS: The side port of the NG tube is 7 cm above the GE junction with the tip at the GE junction. No other abnormalities. IMPRESSION: The side-port of the NG tube is 7 cm above the GE junction with the tip at the GE junction. Recommend advancement. Electronically Signed   By: Dorise Bullion III M.D   On: 02/24/2017 21:55      Impression / Plan:   Sophia Nelson is a 71 y.o. y/o female admitted  with abdominal pain, vomiting of 2 days duration and features of gastric out let obstruction seen on Ct scan of the abdomen.    Plan :  1. EGD   I have discussed alternative options, risks & benefits,  which include, but are not limited to, bleeding, infection, perforation,respiratory complication & drug reaction.  The patient agrees with this plan & written consent will be obtained.    Thank you for involving me in the care of this patient.      LOS: 1 day   Jonathon Bellows, MD  02/25/2017, 11:05 AM

## 2017-02-26 ENCOUNTER — Encounter: Payer: Self-pay | Admitting: Gastroenterology

## 2017-02-26 LAB — URINE CULTURE

## 2017-02-26 LAB — HEMOGLOBIN A1C
Hgb A1c MFr Bld: 6.1 % — ABNORMAL HIGH (ref 4.8–5.6)
Mean Plasma Glucose: 128 mg/dL

## 2017-02-26 LAB — GLUCOSE, CAPILLARY
GLUCOSE-CAPILLARY: 98 mg/dL (ref 65–99)
GLUCOSE-CAPILLARY: 114 mg/dL — AB (ref 65–99)

## 2017-02-26 MED ORDER — MORPHINE SULFATE (PF) 2 MG/ML IV SOLN: 2.0000 mg | INTRAVENOUS | Status: DC | PRN

## 2017-02-26 MED ORDER — METOCLOPRAMIDE HCL 10 MG PO TABS: 10.0000 mg | ORAL_TABLET | Freq: Three times a day (TID) | ORAL | 1 refills | Status: DC

## 2017-02-26 NOTE — Discharge Summary (Signed)
[name] at [facility], 71 y.o., DOB 1946-08-25, MRN [id number]. Admission date: 02/24/2017 Discharge Date 02/26/2017 Primary MD Donnie Coffin, MD Admitting Physician Lance Coon, MD  Admission Diagnosis  Gastric outlet obstruction [K31.1] Epigastric pain [R10.13] AKI (acute kidney injury) ([place]) [N17.9] Nausea and vomiting, intractability of vomiting not specified, unspecified vomiting type [R11.2]  Discharge Diagnosis   Principal Problem: Abdominal pain felt to be due to gastric outlet obstruction however findings consistent with likely gastroparesis Suspected gastroparesis  AKI (acute kidney injury) ([place]) due to dehydration  GERD (gastroesophageal reflux disease)  Diabetes ([place])  COPD (chronic obstructive pulmonary disease) ([place])  Urinary tract infection Chronic respiratory failure requiring 3 L oxygen       Hospital Course  [name] with history of diabetes presented with abdominal pain with vomiting for the past 2 days. Patient in the emergency room was found to have possible gastric outlet obstruction on imaging. She was also noticed to have acute renal failure and a urinary tract infection. Patient was admitted for further evaluation and therapy. She was given IV hydration with improvement in her renal function. She was seen in consultation by GI who performed the EGD which showed undigested food in her stomach. Consistent with gastroparesis. She was recommended to be treated with Reglan. She was treated with Reglan with improvement in her symptoms. She'll need to follow-up with outpatient to make sure she is responding to the Reglan.           Consults  GI  Significant Tests:  See full reports for all details     Ct Abdomen Pelvis Wo Contrast  Result Date: 02/24/2017 CLINICAL DATA:  Initial evaluation for acute generalized lower/ mid abdominal pain with nausea and vomiting. Concern for obstruction.  EXAM: CT CHEST, ABDOMEN AND PELVIS WITHOUT CONTRAST TECHNIQUE: Multidetector CT imaging of the chest, abdomen and pelvis was performed following the standard protocol without IV contrast. COMPARISON:  Prior CT from 11/02/2016. FINDINGS: CT CHEST FINDINGS Cardiovascular: Intrathoracic aorta of normal caliber without aneurysm. Moderate aortic atherosclerosis. Partially visualized great vessels grossly within normal limits. Heart size normal. Three vessel coronary artery calcifications. No pericardial effusion. Mediastinum/Nodes: Thyroid normal. No pathologically enlarged mediastinal, hilar, or axillary lymph nodes identified. Esophagus within normal limits. Lungs/Pleura: Tracheobronchial tree is patent. Upper lobe predominant emphysema. Linear atelectasis present within the lingula and left lower lobe. Lungs are otherwise clear. No focal infiltrates. No pulmonary edema or pleural effusion. No pneumothorax. No worrisome pulmonary nodule or mass. Musculoskeletal: No acute osseus abnormality. No worrisome lytic or blastic osseous lesions. CT ABDOMEN PELVIS FINDINGS Hepatobiliary: Limited noncontrast evaluation of the liver is unremarkable. Punctate calcification within the gallbladder may reflect a small stone versus mural calcification. No evidence for acute cholecystitis. No biliary dilatation. Pancreas: Pancreas within normal limits. Spleen: Spleen within normal limits. Adrenals/Urinary Tract: Adrenal glands within normal limits. Kidneys equal in size without evidence for nephrolithiasis or hydronephrosis. No hydroureter. Bladder partially distended and within normal limits. Stomach/Bowel: Prominent gastric distension. Additionally, there is mild distention of the duodenum to the level of the ligament of Treitz. No obvious obstructive mass identified, although an underlying obstructive lesion could conceivably be present. Distally, small bowel is decompressed and of normal caliber. Appendix normal. Colon is diffusely  decompressed. No other acute inflammatory changes seen about the bowels. Vascular/Lymphatic: Moderate aorto bi-iliac atherosclerotic disease. No aneurysm. No appreciable adenopathy on this noncontrast examination. Reproductive: Uterus is not absent. Ovaries not discretely identified.  Other: No free air or fluid. Musculoskeletal: No acute osseous abnormality. No worrisome lytic or blastic osseous lesions. IMPRESSION: 1. Prominent distension of the stomach and duodenum to the level of the ligament of Treitz, with decompression of the small bowel distally. While this finding may be transient and physiologic in nature, a possible proximal obstructive process could be considered in the correct clinical setting. No obvious mass or other abnormality identified on this noncontrast examination. Correlation with symptomatology recommended. Additionally, GI consultation with possible further evaluation with upper endoscopy could be considered as clinically warranted. 2. No other acute abnormality within the chest, abdomen, and pelvis. 3. Subcentimeter calcification within the gallbladder, which may reflect a small stone or possibly mural calcification. 4. Emphysema. 5. Moderate atherosclerosis with diffuse 3 vessel coronary artery calcifications. 6. Electronically Signed   By: Jeannine Boga M.D.   On: 02/24/2017 20:45   Ct Chest Wo Contrast  Result Date: 02/24/2017 CLINICAL DATA:  Initial evaluation for acute generalized lower/ mid abdominal pain with nausea and vomiting. Concern for obstruction. EXAM: CT CHEST, ABDOMEN AND PELVIS WITHOUT CONTRAST TECHNIQUE: Multidetector CT imaging of the chest, abdomen and pelvis was performed following the standard protocol without IV contrast. COMPARISON:  Prior CT from 11/02/2016. FINDINGS: CT CHEST FINDINGS Cardiovascular: Intrathoracic aorta of normal caliber without aneurysm. Moderate aortic atherosclerosis. Partially visualized great vessels grossly within normal limits.  Heart size normal. Three vessel coronary artery calcifications. No pericardial effusion. Mediastinum/Nodes: Thyroid normal. No pathologically enlarged mediastinal, hilar, or axillary lymph nodes identified. Esophagus within normal limits. Lungs/Pleura: Tracheobronchial tree is patent. Upper lobe predominant emphysema. Linear atelectasis present within the lingula and left lower lobe. Lungs are otherwise clear. No focal infiltrates. No pulmonary edema or pleural effusion. No pneumothorax. No worrisome pulmonary nodule or mass. Musculoskeletal: No acute osseus abnormality. No worrisome lytic or blastic osseous lesions. CT ABDOMEN PELVIS FINDINGS Hepatobiliary: Limited noncontrast evaluation of the liver is unremarkable. Punctate calcification within the gallbladder may reflect a small stone versus mural calcification. No evidence for acute cholecystitis. No biliary dilatation. Pancreas: Pancreas within normal limits. Spleen: Spleen within normal limits. Adrenals/Urinary Tract: Adrenal glands within normal limits. Kidneys equal in size without evidence for nephrolithiasis or hydronephrosis. No hydroureter. Bladder partially distended and within normal limits. Stomach/Bowel: Prominent gastric distension. Additionally, there is mild distention of the duodenum to the level of the ligament of Treitz. No obvious obstructive mass identified, although an underlying obstructive lesion could conceivably be present. Distally, small bowel is decompressed and of normal caliber. Appendix normal. Colon is diffusely decompressed. No other acute inflammatory changes seen about the bowels. Vascular/Lymphatic: Moderate aorto bi-iliac atherosclerotic disease. No aneurysm. No appreciable adenopathy on this noncontrast examination. Reproductive: Uterus is not absent. Ovaries not discretely identified. Other: No free air or fluid. Musculoskeletal: No acute osseous abnormality. No worrisome lytic or blastic osseous lesions. IMPRESSION: 1.  Prominent distension of the stomach and duodenum to the level of the ligament of Treitz, with decompression of the small bowel distally. While this finding may be transient and physiologic in nature, a possible proximal obstructive process could be considered in the correct clinical setting. No obvious mass or other abnormality identified on this noncontrast examination. Correlation with symptomatology recommended. Additionally, GI consultation with possible further evaluation with upper endoscopy could be considered as clinically warranted. 2. No other acute abnormality within the chest, abdomen, and pelvis. 3. Subcentimeter calcification within the gallbladder, which may reflect a small stone or possibly mural calcification. 4. Emphysema. 5. Moderate atherosclerosis with diffuse 3  vessel coronary artery calcifications. 6. Electronically Signed   By: Jeannine Boga M.D.   On: 02/24/2017 20:45   Dg Chest Portable 1 View  Result Date: 02/24/2017 CLINICAL DATA:  Initial evaluation for central line placement. EXAM: PORTABLE CHEST 1 VIEW COMPARISON:  Prior CT from 11/02/2016. FINDINGS: Right IJ approach centra venous catheter in place with tip overlying the distal SVC. Cardiac mediastinal silhouettes are within normal limits. Aortic atherosclerosis. Lungs mildly hypoinflated. Mild subsegmental atelectasis at the bilateral lung bases. No focal infiltrates. No pulmonary edema or pleural effusion. No pneumothorax. No acute osseus abnormality. IMPRESSION: 1. Right IJ approach centra venous catheter in place with tip overlying the distal SVC. 2. Shallow lung inflation with mild bibasilar subsegmental atelectasis. 3. No other active cardiopulmonary disease. 4. Aortic atherosclerosis. Electronically Signed   By: Jeannine Boga M.D.   On: 02/24/2017 18:49   Dg Abd Portable 1 View  Result Date: 02/24/2017 CLINICAL DATA:  Nasogastric tube advancement.  Initial encounter. EXAM: PORTABLE ABDOMEN - 1 VIEW  COMPARISON:  Abdominal radiograph performed earlier today at 9:43 p.m. FINDINGS: The patient's enteric tube is seen ending overlying the fundus of the stomach, with the side port about the fundus of the stomach. The visualized bowel gas pattern is grossly unremarkable. No free intra-abdominal air is seen, though evaluation for free air on a supine radiograph is limited. The visualized lung bases are clear. Mild degenerative change is noted at the lower lumbar spine. Mild sclerosis is seen at the sacroiliac joints. IMPRESSION: Enteric tube noted ending overlying the fundus of the stomach. Electronically Signed   By: Garald Balding M.D.   On: 02/24/2017 22:44   Dg Abd Portable 1 View  Result Date: 02/24/2017 CLINICAL DATA:  Evaluate NG tube placement. EXAM: PORTABLE ABDOMEN - 1 VIEW COMPARISON:  None FINDINGS: The side port of the NG tube is 7 cm above the GE junction with the tip at the GE junction. No other abnormalities. IMPRESSION: The side-port of the NG tube is 7 cm above the GE junction with the tip at the GE junction. Recommend advancement. Electronically Signed   By: Dorise Bullion III M.D   On: 02/24/2017 21:55       Today   Subjective:   Sophia Nelson patient feels much better abdominal pain nausea vomiting resolved  Objective:   Blood pressure 111/72, pulse 75, temperature 98.8 F (37.1 C), temperature source Oral, resp. rate 20, height 5\' 2"  (1.575 m), weight 147 lb 12.8 oz (67 kg), SpO2 100 %.  .  Intake/Output Summary (Last 24 hours) at 02/26/17 1201 Last data filed at 02/26/17 0430  Gross per 24 hour  Intake          1267.25 ml  Output                0 ml  Net          1267.25 ml    Exam VITAL SIGNS: Blood pressure 111/72, pulse 75, temperature 98.8 F (37.1 C), temperature source Oral, resp. rate 20, height 5\' 2"  (1.575 m), weight 147 lb 12.8 oz (67 kg), SpO2 100 %.  GENERAL:  71 y.o.-year-old patient lying in the bed with no acute distress.  EYES: Pupils equal, round,  reactive to light and accommodation. No scleral icterus. Extraocular muscles intact.  HEENT: Head atraumatic, normocephalic. Oropharynx and nasopharynx clear.  NECK:  Supple, no jugular venous distention. No thyroid enlargement, no tenderness.  LUNGS: Normal breath sounds bilaterally, no wheezing, rales,rhonchi or crepitation. No  use of accessory muscles of respiration.  CARDIOVASCULAR: S1, S2 normal. No murmurs, rubs, or gallops.  ABDOMEN: Soft, nontender, nondistended. Bowel sounds present. No organomegaly or mass.  EXTREMITIES: No pedal edema, cyanosis, or clubbing.  NEUROLOGIC: Cranial nerves II through XII are intact. Muscle strength 5/5 in all extremities. Sensation intact. Gait not checked.  PSYCHIATRIC: The patient is alert and oriented x 3.  SKIN: No obvious rash, lesion, or ulcer.   Data Review     CBC w Diff: Lab Results  Component Value Date   WBC 15.3 (H) 02/25/2017   HGB 9.7 (L) 02/25/2017   HGB 15.8 04/08/2014   HCT 29.2 (L) 02/25/2017   HCT 49.9 (H) 04/08/2014   PLT 468 (H) 02/25/2017   PLT 285 04/08/2014   LYMPHOPCT 31 02/13/2017   MONOPCT 10 02/13/2017   EOSPCT 1 02/13/2017   BASOPCT 0 02/13/2017   CMP: Lab Results  Component Value Date   NA 139 02/25/2017   NA 133 (L) 04/08/2014   K 3.5 02/25/2017   K 4.2 04/08/2014   CL 97 (L) 02/25/2017   CL 99 04/08/2014   CO2 29 02/25/2017   CO2 28 04/08/2014   BUN 44 (H) 02/25/2017   BUN 5 (L) 04/08/2014   CREATININE 2.64 (H) 02/25/2017   CREATININE 0.42 (L) 04/08/2014   PROT 8.9 (H) 02/24/2017   ALBUMIN 4.5 02/24/2017   BILITOT 0.9 02/24/2017   ALKPHOS 94 02/24/2017   AST 21 02/24/2017   ALT 9 (L) 02/24/2017  .  Micro Results No results found for this or any previous visit (from the past 240 hour(s)).      Code Status Orders        Start     Ordered   02/24/17 2341  Full code  Continuous     02/24/17 2340    Code Status History    Date Active Date Inactive Code Status Order ID Comments User  Context   This patient has a current code status but no historical code status.          Follow-up Information    Donnie Coffin, MD On 03/13/2017.   Specialty:  Family Medicine Why:  at 3:00 p.m. Contact information: [place] [address] [phone]        Jonathon Bellows, MD In 2 weeks.   Specialty:  Surgery Why:  gastropersis Contact information: [place] [address] [phone]           Discharge Medications   Allergies as of 02/26/2017      Reactions   Other    Onions(that grow in the yard-pt can eat onions without problems) and dust mite Sneezing, cough, runny nose      Medication List    TAKE these medications   ADVAIR DISKUS 500-50 MCG/DOSE Aepb Generic drug:  Fluticasone-Salmeterol Inhale 1 puff into the lungs 2 (two) times daily.   albuterol 108 (90 Base) MCG/ACT inhaler Commonly known as:  PROVENTIL HFA;VENTOLIN HFA Inhale 2 puffs into the lungs every 6 (six) hours as needed for wheezing or shortness of breath.   aspirin 81 MG tablet Take 81 mg by mouth daily.   atorvastatin 10 MG tablet Commonly known as:  LIPITOR Take 10 mg by mouth daily.   docusate sodium 100 MG capsule Commonly known as:  COLACE Take 100 mg by mouth 2 (two) times daily.   fluticasone 50 MCG/ACT nasal spray Commonly known as:  Scalp Level  2 sprays into both nostrils daily.   GLIPIZIDE XL 2.5 MG 24 hr tablet Generic drug:  glipiZIDE Take 2.5 mg by mouth daily with breakfast.   hydrochlorothiazide 12.5 MG tablet Commonly known as:  HYDRODIURIL Take 12.5 mg by mouth daily.   IBRANCE 125 MG capsule Generic drug:  palbociclib Take 125 mg by mouth daily with lunch.   letrozole 2.5 MG tablet Commonly known as:  FEMARA Take 1 tablet (2.5 mg total) by mouth daily.   lisinopril 40 MG tablet Commonly known as:  PRINIVIL,ZESTRIL Take 40 mg by mouth every morning.   loratadine 10 MG tablet Commonly known as:   CLARITIN Take 10 mg by mouth daily.   metoCLOPramide 10 MG tablet Commonly known as:  REGLAN Take 1 tablet (10 mg total) by mouth 4 (four) times daily -  before meals and at bedtime.   montelukast 10 MG tablet Commonly known as:  SINGULAIR Take 10 mg by mouth at bedtime.   omeprazole 20 MG capsule Commonly known as:  PRILOSEC Take 20 mg by mouth every morning.   oxyCODONE-acetaminophen 5-325 MG tablet Commonly known as:  PERCOCET/ROXICET Take 1 tablet by mouth every 4 (four) hours as needed for severe pain.   OXYGEN Inhale 3 L into the lungs continuous.   SPIRIVA HANDIHALER 18 MCG inhalation capsule Generic drug:  tiotropium Place 18 mcg into inhaler and inhale every morning.          Total Time in preparing paper work, data evaluation and todays exam - 35 minutes  Dustin Flock M.D on 02/26/2017 at 12:01 [facility]  [facility]   Office  [phone]

## 2017-02-26 NOTE — Plan of Care (Signed)
Problem: Education: Goal: Knowledge of [facility] Education information/materials will improve Outcome: Progressing Tmax 37.8, not meeting threshold for PRN Tylenol, WDL at AM VS check.  VSS otherwise, denies pain, free of falls.  No needs overnight.  No longer NPO, Dr. Jannifer Franklin paged re: CBG schedule, changed to ACHS.  Bed in low position, call bell within reach.  WCTM.

## 2017-02-26 NOTE — Discharge Instructions (Addendum)
[place] at [place]:  Diabetic diet  DISCHARGE CONDITION:  Stable  ACTIVITY:  Activity as tolerated  OXYGEN:  Home Oxygen: yes   Oxygen Delivery:3l all time  DISCHARGE LOCATION:  home    ADDITIONAL DISCHARGE INSTRUCTION:eat small amounts at time   If you experience worsening of your admission symptoms, develop shortness of breath, life threatening emergency, suicidal or homicidal thoughts you must seek medical attention immediately by calling 911 or calling your MD immediately  if symptoms less severe.  You Must read complete instructions/literature along with all the possible adverse reactions/side effects for all the Medicines you take and that have been prescribed to you. Take any new Medicines after you have completely understood and accpet all the possible adverse reactions/side effects.   Please note  You were cared for by a hospitalist during your hospital stay. If you have any questions about your discharge medications or the care you received while you were in the hospital after you are discharged, you can call the unit and asked to speak with the hospitalist on call if the hospitalist that took care of you is not available. Once you are discharged, your primary care physician will handle any further medical issues. Please note that NO REFILLS for any discharge medications will be authorized once you are discharged, as it is imperative that you return to your primary care physician (or establish a relationship with a primary care physician if you do not have one) for your aftercare needs so that they can reassess your need for medications and monitor your lab values.

## 2017-03-03 ENCOUNTER — Emergency Department: Payer: Medicare HMO

## 2017-03-03 ENCOUNTER — Encounter: Payer: Self-pay | Admitting: Emergency Medicine

## 2017-03-03 ENCOUNTER — Inpatient Hospital Stay
Admission: EM | Admit: 2017-03-03 | Discharge: 2017-03-13 | DRG: 326 | Disposition: A | Discharge status: Home Health | Payer: Medicare HMO | Attending: Internal Medicine | Admitting: Internal Medicine

## 2017-03-03 DIAGNOSIS — I10 Essential (primary) hypertension: Secondary | ICD-10-CM | POA: Diagnosis present

## 2017-03-03 DIAGNOSIS — F17210 Nicotine dependence, cigarettes, uncomplicated: Secondary | ICD-10-CM | POA: Diagnosis present

## 2017-03-03 DIAGNOSIS — E43 Unspecified severe protein-calorie malnutrition: Secondary | ICD-10-CM | POA: Diagnosis present

## 2017-03-03 DIAGNOSIS — Z7982 Long term (current) use of aspirin: Secondary | ICD-10-CM

## 2017-03-03 DIAGNOSIS — Z79899 Other long term (current) drug therapy: Secondary | ICD-10-CM

## 2017-03-03 DIAGNOSIS — Z789 Other specified health status: Secondary | ICD-10-CM

## 2017-03-03 DIAGNOSIS — E1143 Type 2 diabetes mellitus with diabetic autonomic (poly)neuropathy: Secondary | ICD-10-CM | POA: Diagnosis present

## 2017-03-03 DIAGNOSIS — K315 Obstruction of duodenum: Principal | ICD-10-CM

## 2017-03-03 DIAGNOSIS — E785 Hyperlipidemia, unspecified: Secondary | ICD-10-CM | POA: Diagnosis present

## 2017-03-03 DIAGNOSIS — Z0189 Encounter for other specified special examinations: Secondary | ICD-10-CM

## 2017-03-03 DIAGNOSIS — Z4659 Encounter for fitting and adjustment of other gastrointestinal appliance and device: Secondary | ICD-10-CM

## 2017-03-03 DIAGNOSIS — Z7951 Long term (current) use of inhaled steroids: Secondary | ICD-10-CM

## 2017-03-03 DIAGNOSIS — E11649 Type 2 diabetes mellitus with hypoglycemia without coma: Secondary | ICD-10-CM | POA: Diagnosis not present

## 2017-03-03 DIAGNOSIS — K219 Gastro-esophageal reflux disease without esophagitis: Secondary | ICD-10-CM | POA: Diagnosis present

## 2017-03-03 DIAGNOSIS — J449 Chronic obstructive pulmonary disease, unspecified: Secondary | ICD-10-CM | POA: Diagnosis present

## 2017-03-03 DIAGNOSIS — C7951 Secondary malignant neoplasm of bone: Secondary | ICD-10-CM | POA: Diagnosis present

## 2017-03-03 DIAGNOSIS — Z6827 Body mass index (BMI) 27.0-27.9, adult: Secondary | ICD-10-CM

## 2017-03-03 DIAGNOSIS — D638 Anemia in other chronic diseases classified elsewhere: Secondary | ICD-10-CM | POA: Diagnosis present

## 2017-03-03 DIAGNOSIS — N179 Acute kidney failure, unspecified: Secondary | ICD-10-CM | POA: Diagnosis present

## 2017-03-03 DIAGNOSIS — M542 Cervicalgia: Secondary | ICD-10-CM | POA: Diagnosis present

## 2017-03-03 DIAGNOSIS — Z95828 Presence of other vascular implants and grafts: Secondary | ICD-10-CM

## 2017-03-03 DIAGNOSIS — R140 Abdominal distension (gaseous): Secondary | ICD-10-CM

## 2017-03-03 DIAGNOSIS — C50912 Malignant neoplasm of unspecified site of left female breast: Secondary | ICD-10-CM

## 2017-03-03 DIAGNOSIS — C50812 Malignant neoplasm of overlapping sites of left female breast: Secondary | ICD-10-CM | POA: Diagnosis present

## 2017-03-03 DIAGNOSIS — K859 Acute pancreatitis without necrosis or infection, unspecified: Secondary | ICD-10-CM

## 2017-03-03 DIAGNOSIS — M8448XA Pathological fracture, other site, initial encounter for fracture: Secondary | ICD-10-CM | POA: Diagnosis present

## 2017-03-03 DIAGNOSIS — K3184 Gastroparesis: Secondary | ICD-10-CM | POA: Diagnosis present

## 2017-03-03 DIAGNOSIS — C50919 Malignant neoplasm of unspecified site of unspecified female breast: Secondary | ICD-10-CM

## 2017-03-03 DIAGNOSIS — E876 Hypokalemia: Secondary | ICD-10-CM | POA: Diagnosis not present

## 2017-03-03 LAB — HEPATIC FUNCTION PANEL
ALT: 11 U/L — ABNORMAL LOW (ref 14–54)
AST: 23 U/L (ref 15–41)
Albumin: 3.4 g/dL — ABNORMAL LOW (ref 3.5–5.0)
Alkaline Phosphatase: 70 U/L (ref 38–126)
BILIRUBIN DIRECT: 0.1 mg/dL (ref 0.1–0.5)
BILIRUBIN INDIRECT: 0.9 mg/dL (ref 0.3–0.9)
TOTAL PROTEIN: 6.7 g/dL (ref 6.5–8.1)
Total Bilirubin: 1 mg/dL (ref 0.3–1.2)

## 2017-03-03 LAB — BASIC METABOLIC PANEL
Anion gap: 14 (ref 5–15)
BUN: 45 mg/dL — AB (ref 6–20)
CALCIUM: 9 mg/dL (ref 8.9–10.3)
CO2: 32 mmol/L (ref 22–32)
CREATININE: 1.49 mg/dL — AB (ref 0.44–1.00)
Chloride: 91 mmol/L — ABNORMAL LOW (ref 101–111)
GFR calc Af Amer: 40 mL/min — ABNORMAL LOW (ref >60)
GFR, EST NON AFRICAN AMERICAN: 35 mL/min — AB (ref >60)
GLUCOSE: 133 mg/dL — AB (ref 65–99)
Potassium: 4 mmol/L (ref 3.5–5.1)
SODIUM: 137 mmol/L (ref 135–145)

## 2017-03-03 LAB — TROPONIN I: Troponin I: <0.03 ng/mL (ref <0.03)

## 2017-03-03 LAB — CBC
HCT: 31.1 % — ABNORMAL LOW (ref 35.0–47.0)
Hemoglobin: 10.6 g/dL — ABNORMAL LOW (ref 12.0–16.0)
MCH: 27.6 pg (ref 26.0–34.0)
MCHC: 34.2 g/dL (ref 32.0–36.0)
MCV: 80.6 fL (ref 80.0–100.0)
PLATELETS: 409 10*3/uL (ref 150–440)
RBC: 3.86 MIL/uL (ref 3.80–5.20)
RDW: 18.1 % — AB (ref 11.5–14.5)
WBC: 12.8 10*3/uL — AB (ref 3.6–11.0)

## 2017-03-03 LAB — LIPASE, BLOOD: LIPASE: 146 U/L — AB (ref 11–51)

## 2017-03-03 MED ORDER — METOCLOPRAMIDE HCL 5 MG/ML IJ SOLN
10.0000 mg | Freq: Once | INTRAMUSCULAR | Status: AC
  Administered 2017-03-03: 10 mg via INTRAVENOUS
  Filled 2017-03-03: qty 2

## 2017-03-03 MED ORDER — SODIUM CHLORIDE 0.9 % IV BOLUS (SEPSIS)
1000.0000 mL | Freq: Once | INTRAVENOUS | Status: AC
  Administered 2017-03-03: 1000 mL via INTRAVENOUS

## 2017-03-03 MED ORDER — MORPHINE SULFATE (PF) 4 MG/ML IV SOLN
4.0000 mg | Freq: Once | INTRAVENOUS | Status: AC
  Administered 2017-03-03: 4 mg via INTRAVENOUS
  Filled 2017-03-03: qty 1

## 2017-03-03 MED ORDER — MORPHINE SULFATE (PF) 2 MG/ML IV SOLN
2.0000 mg | Freq: Once | INTRAVENOUS | Status: AC
  Administered 2017-03-03: 2 mg via INTRAVENOUS
  Filled 2017-03-03: qty 1

## 2017-03-03 MED ORDER — ALUM & MAG HYDROXIDE-SIMETH 200-200-20 MG/5ML PO SUSP
30.0000 mL | Freq: Once | ORAL | Status: AC
  Administered 2017-03-03: 30 mL via ORAL
  Filled 2017-03-03: qty 30

## 2017-03-03 MED ORDER — ONDANSETRON HCL 4 MG/2ML IJ SOLN
4.0000 mg | Freq: Once | INTRAMUSCULAR | Status: AC
  Administered 2017-03-03: 4 mg via INTRAVENOUS
  Filled 2017-03-03: qty 2

## 2017-03-03 NOTE — ED Notes (Signed)
ED Provider at bedside. 

## 2017-03-03 NOTE — ED Notes (Signed)
Patient transported to X-ray 

## 2017-03-03 NOTE — ED Notes (Signed)
Lab called and stated the second draw for green top is hemolyzed, I asked them to send a phlebotomist for the draw.

## 2017-03-03 NOTE — ED Triage Notes (Addendum)
Pt arrives from home via EMS with c/o CP left sided with radiation to lower back.  Pt states she has not been feeling good and has been in pain for the last 2 days.  Pt has hx of BRCA, diabetes, and hypertension.  VS are WNL during triage and patient is AOx4 and in NAD.  Patient states she has "taken all of her medication".  EMS states patient is on 3L continuous oxygen at home.

## 2017-03-03 NOTE — H&P (Signed)
History and Physical   [facility] - [place] @ [facility] Admission History and Physical [employer], D.O.    Patient Name: Sophia Nelson MR#: [id number] Date of Birth: 1946-06-04 Date of Admission: 03/03/2017  Referring MD/NP/PA: Dr. Kerman Passey Primary Care Physician: Donnie Coffin, MD   Chief Complaint:  Chief Complaint  Patient presents with  . Chest Pain    HPI: Sophia Nelson is a 71 y.o. female with a past medical history of arthritis, COPD, gastric reflux, diabetes, hypertension, presents to the emergency department with chest pain. Patient was admitted to the hospital from 02/24/17 - 02/26/17 for abdominal pain / nausea found to have gastroparesis/ outlet obstruction.  Abdominal pain and nausea have been well controlled however she sought medical attention today when she developed central chest pain associated with indigestion. Pain is central/midepigastric and burning in nature.   No diaphoresis, palpitations, SOB.  Also complains of 3 days of no bowel movement and minimal flatus.   Patient denies fevers/chills, weakness, dizziness, shortness of breath, dysuria/frequency, changes in mental status.   EMS/ED Course: Patient received Maalox, Reglan, morphine, Zofran and normal saline. Medical admission was requested for further management of gastroparesis and pancreatitis, chest pain.   Review of Systems:  CONSTITUTIONAL: No fever/chills, fatigue, weakness, weight gain/loss, headache. EYES: No blurry or double vision. ENT: No tinnitus, postnasal drip, redness or soreness of the oropharynx. RESPIRATORY: No cough, dyspnea, wheeze.  No hemoptysis.  CARDIOVASCULAR: Positive chest pain, negative palpitations, syncope, orthopnea. No lower extremity edema.  GASTROINTESTINAL: Positive nausea, vomiting, abdominal pain, constipation. Negative diarrhea  No hematemesis, melena or hematochezia. GENITOURINARY: Positive urinary retention. No dysuria, frequency, hematuria. ENDOCRINE: No  polyuria or nocturia. No heat or cold intolerance. HEMATOLOGY: No anemia, bruising, bleeding. INTEGUMENTARY: No rashes, ulcers, lesions. MUSCULOSKELETAL: No arthritis, gout, dyspnea. NEUROLOGIC: No numbness, tingling, ataxia, seizure-type activity, weakness. PSYCHIATRIC: No anxiety, depression, insomnia.   Past Medical History:  Diagnosis Date  . Anemia   . Arthritis   . Asthma   . 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 Cancer of left breast ([place]) 08/09/2015   T4 N1 M1 tumor, INVASIVE LOBULAR CARCINOMA.  [name] COPD (chronic obstructive pulmonary disease) ([place])   . Diabetes mellitus without complication ([place])   . GERD (gastroesophageal reflux disease)   . Headache    h/o migraines as a child  . Hypertension   . Pneumonia 2015  . Shortness of breath dyspnea    with exertion    Past Surgical History:  Procedure Laterality Date  . ABDOMINAL HYSTERECTOMY    . BREAST SURGERY Right 2015   mastectomy  . ESOPHAGOGASTRODUODENOSCOPY N/A 02/25/2017   Procedure: ESOPHAGOGASTRODUODENOSCOPY (EGD);  Surgeon: Jonathon Bellows, MD;  Location: [facility] ENDOSCOPY;  Service: Endoscopy;  Laterality: N/A;  . SIMPLE MASTECTOMY WITH AXILLARY SENTINEL NODE BIOPSY Left 02/29/2016   Procedure: SIMPLE MASTECTOMY;  Surgeon: Christene Lye, MD;  Location: [facility] ORS;  Service: General;  Laterality: Left;  . TUBAL LIGATION       reports that she has been smoking Cigarettes.  She has a 3.75 pack-year smoking history. She has never used smokeless tobacco. She reports that she does not drink alcohol or use drugs.  Allergies  Allergen Reactions  . Other     Onions(that grow in the yard-pt can eat onions without problems) and dust mite Sneezing, cough, runny nose    Family History  Problem Relation Age of Onset  . Lung cancer Father   . Breast cancer Neg Hx  Prior to Admission medications   Medication Sig Start Date End Date Taking? Authorizing Provider  ADVAIR DISKUS 500-50 MCG/DOSE AEPB  Inhale 1 puff into the lungs 2 (two) times daily.  05/04/15  Yes [provider]  albuterol (PROVENTIL HFA;VENTOLIN HFA) 108 (90 Base) MCG/ACT inhaler Inhale 2 puffs into the lungs every 6 (six) hours as needed for wheezing or shortness of breath.   Yes [provider]  aspirin 81 MG tablet Take 81 mg by mouth daily.   Yes [provider]  atorvastatin (LIPITOR) 10 MG tablet Take 10 mg by mouth daily.   Yes [provider]  docusate sodium (COLACE) 100 MG capsule Take 100 mg by mouth 2 (two) times daily.   Yes [provider]  fluticasone (FLONASE) 50 MCG/ACT nasal spray Place 2 sprays into both nostrils daily.   Yes [provider]  GLIPIZIDE XL 2.5 MG 24 hr tablet Take 2.5 mg by mouth daily with breakfast.  06/23/15  Yes [provider]  hydrochlorothiazide (HYDRODIURIL) 12.5 MG tablet Take 12.5 mg by mouth daily. 02/08/17  Yes [provider]  letrozole (FEMARA) 2.5 MG tablet Take 1 tablet (2.5 mg total) by mouth daily. 11/01/16  Yes [name], MD  lisinopril (PRINIVIL,ZESTRIL) 40 MG tablet Take 40 mg by mouth every morning.  06/23/15  Yes [provider]  loratadine (CLARITIN) 10 MG tablet Take 10 mg by mouth daily.   Yes [provider]  metoCLOPramide (REGLAN) 10 MG tablet Take 1 tablet (10 mg total) by mouth 4 (four) times daily -  before meals and at bedtime. 02/26/17 02/26/18 Yes Dustin Flock, MD  montelukast (SINGULAIR) 10 MG tablet Take 10 mg by mouth at bedtime.   Yes [provider]  omeprazole (PRILOSEC) 20 MG capsule Take 20 mg by mouth every morning.  06/23/15  Yes [provider]  oxyCODONE-acetaminophen (PERCOCET/ROXICET) 5-325 MG tablet Take 1 tablet by mouth every 4 (four) hours as needed for severe pain.   Yes [provider]  OXYGEN Inhale 3 L into the lungs continuous.    Yes [provider]  palbociclib (IBRANCE) 125 MG capsule Take 125 mg by  mouth daily with lunch.  03/14/16  Yes [provider]  SPIRIVA HANDIHALER 18 MCG inhalation capsule Place 18 mcg into inhaler and inhale every morning.  05/04/15  Yes [provider]    Physical Exam: Vitals:   03/03/17 2100 03/03/17 2130 03/03/17 2200 03/03/17 2310  BP: (!) 144/108 105/66 (!) 118/94 114/81  Pulse: 96 88 79 86  Resp: (!) 23 (!) 21 15 13   Temp:      TempSrc:      SpO2: 99% 99% 99% 98%    GENERAL: 71 y.o.-year-old Female patient, chronically ill appearing. Lying in the bed in no acute distress.  Pleasant and cooperative.   HEENT: Head atraumatic, normocephalic. Pupils equal, round, reactive to light and accommodation. No scleral icterus. Extraocular muscles intact. Mucus membranes moist. NECK: Supple, full range of motion. CHEST: Normal breath sounds bilaterally. No wheezing, rales, rhonchi or crackles. No use of accessory muscles of respiration.  No reproducible chest wall tenderness.  CARDIOVASCULAR: S1, S2 normal. No murmurs, rubs, or gallops. Cap refill <2 seconds. Pulses intact distally.  ABDOMEN: Tense, distended, diffusely tender. . Mild positive superpubic tenderness No rebound, guarding, rigidity. Positive bowel sounds x4. EXTREMITIES: No pedal edema, cyanosis, or clubbing. No calf tenderness or Homan's sign.  NEUROLOGIC: The patient is alert and oriented x 3. Cranial  nerves II through XII are grossly intact with no focal sensorimotor deficit. Muscle strength equal in all extremities.  PSYCHIATRIC:  Normal affect, mood, thought content. SKIN: Warm, dry, and intact without obvious rash, lesion, or ulcer.  Labs on Admission:  CBC:  Recent Labs Lab 02/25/17 0450 03/03/17 2027  WBC 15.3* 12.8*  HGB 9.7* 10.6*  HCT 29.2* 31.1*  MCV 83.3 80.6  PLT 468* 124   Basic Metabolic Panel:  Recent Labs Lab 02/25/17 0450 03/03/17 2140  NA 139 137  K 3.5 4.0  CL 97* 91*  CO2 29 32  GLUCOSE 117* 133*  BUN 44* 45*  CREATININE 2.64* 1.49*   CALCIUM 8.6* 9.0   GFR: Estimated Creatinine Clearance: 32 mL/min (A) (by C-G formula based on SCr of 1.49 mg/dL (H)). Liver Function Tests:  Recent Labs Lab 03/03/17 2140  AST 23  ALT 11*  ALKPHOS 70  BILITOT 1.0  PROT 6.7  ALBUMIN 3.4*    Recent Labs Lab 03/03/17 2140  LIPASE 146*   No results for input(s): AMMONIA in the last 168 hours. Coagulation Profile: No results for input(s): INR, PROTIME in the last 168 hours. Cardiac Enzymes:  Recent Labs Lab 03/03/17 2140  TROPONINI <0.03   BNP (last 3 results) No results for input(s): PROBNP in the last 8760 hours. HbA1C: No results for input(s): HGBA1C in the last 72 hours. CBG:  Recent Labs Lab 02/25/17 1351 02/25/17 1741 02/25/17 2210 02/26/17 0725 02/26/17 1218  GLUCAP 93 122* 110* 98 114*   Lipid Profile: No results for input(s): CHOL, HDL, LDLCALC, TRIG, CHOLHDL, LDLDIRECT in the last 72 hours. Thyroid Function Tests: No results for input(s): TSH, T4TOTAL, FREET4, T3FREE, THYROIDAB in the last 72 hours. Anemia Panel: No results for input(s): VITAMINB12, FOLATE, FERRITIN, TIBC, IRON, RETICCTPCT in the last 72 hours. Urine analysis:    Component Value Date/Time   COLORURINE YELLOW (A) 02/25/2017 0845   APPEARANCEUR CLOUDY (A) 02/25/2017 0845   LABSPEC 1.016 02/25/2017 0845   PHURINE 5.0 02/25/2017 0845   GLUCOSEU NEGATIVE 02/25/2017 0845   HGBUR NEGATIVE 02/25/2017 0845   BILIRUBINUR NEGATIVE 02/25/2017 0845   KETONESUR NEGATIVE 02/25/2017 0845   PROTEINUR NEGATIVE 02/25/2017 0845   NITRITE NEGATIVE 02/25/2017 0845   LEUKOCYTESUR LARGE (A) 02/25/2017 0845   Sepsis Labs: @LABRCNTIP (procalcitonin:4,lacticidven:4) ) Recent Results (from the past 240 hour(s))  Urine Culture     Status: Abnormal   Collection Time: 02/25/17  8:45 AM  Result Value Ref Range Status   Specimen Description URINE, RANDOM  Final   Special Requests NONE  Final   Culture MULTIPLE SPECIES PRESENT, SUGGEST RECOLLECTION  (A)  Final   Report Status 02/26/2017 FINAL  Final     Radiological Exams on Admission: Dg Chest 2 View  Result Date: 03/03/2017 CLINICAL DATA:  Chest pain and weakness EXAM: CHEST  2 VIEW COMPARISON:  Chest radiograph and chest CT February 24, 2017 FINDINGS: There is atelectatic change in the lung bases. Lungs elsewhere are clear. Heart is upper normal in size with pulmonary vascularity within normal limits. There is aortic atherosclerosis. There is mild degenerative change in thoracic spine. IMPRESSION: Bibasilar atelectasis. No edema or consolidation. Stable cardiac silhouette. There is aortic atherosclerosis. Aortic Atherosclerosis (ICD10-I70.0). Electronically Signed   By: Lowella Grip III M.D.   On: 03/03/2017 21:05    EKG: Normal sinus rhythm at 93 bpm with normal axis and nonspecific ST-T wave changes.   Assessment/Plan  This is a 70 y.o. female with a history of arthritis,  COPD, gastric reflux, diabetes, hypertension, now being admitted with:  #. Pancreatitis / gastroparesis  - Admit inpatient - IV fluid hydration - Pain control and antiemetics as needed - NPO for now - Check CT, noncontrast - GI consultation has been requested.  #. Chest pain, rule out ACS - Telemetry monitoring. - Trend troponins, check lipids and TSH. - Morphine, nitro, aspirin and statin ordered.   - Check echo - Cardiology consultation has been requested.  #. GERD - Continue IV Protonix - Maalox as needed  #. History of hyperlipidemia -Continue Lipitor  #. History of hypertension - Continue HCTZ, lisinopril  #. History of COPD - Continue Flonase, Singulair, Advair, Spiriva  #. H/o Diabetes - Accuchecks q4h with RISS coverage - Hold glipizide  Admission status: Inpatient IV Fluids: Inpatient Diet/Nutrition: Normal saline, nothing by mouth Consults called: None  DVT Px: Lovenox, SCDs and early ambulation. Code Status: Full Code  Disposition Plan: To home in 2-3 days  All the records  are reviewed and case discussed with ED provider. Management plans discussed with the patient and/or family who express understanding and agree with plan of care.  Anyjah Roundtree D.O. on 03/03/2017 at 11:56 PM Between 7am to 6pm - Pager - [phone] After 6pm go to [email or web address] - [employer] [place] Hospitalists Office [phone] CC: Primary care physician; Donnie Coffin, MD   03/03/2017, 11:56 PM

## 2017-03-03 NOTE — ED Provider Notes (Signed)
[facility] Emergency Department Provider Note  Time seen: 9:13 PM  I have reviewed the triage vital signs and the nursing notes.   HISTORY  Chief Complaint Chest Pain    HPI Sophia Nelson is a 71 y.o. female with a past medical history of arthritis, COPD, gastric reflux, diabetes, hypertension, presents to the emergency department with chest pain. According to the patient and record review the patient was seen in the emergency department and admitted to the hospital for abdominal pain and vomiting on 02/24/17, discharge 02/26/17. Patient states since going home she has continued to have abdominal discomfort with occasional nausea. Patient was ultimately diagnosed with gastroparesis. Patient has been taking her Reglan at home and states that is controlling her nausea and vomiting. She states however today she developed chest discomfort this morning which has progressed throughout the day today. Patient states mild to moderate central chest discomfort and she also states she is having significant gastric reflux today and does not know if this is related. Denies any fever. Denies any diarrhea. Patient states left-sided abdominal pain, but states this is unchanged from discharge home. Patient also states since going home she has been having difficulty urinating which she describes as a pressure sensation in her bladder and not being able to completely empty her bladder.  Past Medical History:  Diagnosis Date  . Anemia   . Arthritis   . Asthma   . Breast cancer ([place]) 2009   RT MASTECTOMY, DCIS  . Cancer of left breast ([place]) 08/09/2015   T4 N1 M1 tumor, INVASIVE LOBULAR CARCINOMA.  [name] COPD (chronic obstructive pulmonary disease) ([place])   . Diabetes mellitus without complication ([place])   . GERD (gastroesophageal reflux disease)   . Headache    h/o migraines as a child  . Hypertension   . Pneumonia 2015  . Shortness of breath dyspnea    with exertion    Patient Active  Problem List   Diagnosis Date Noted  . Gastric outlet obstruction 02/24/2017  . AKI (acute kidney injury) ([place]) 02/24/2017  . GERD (gastroesophageal reflux disease) 02/24/2017  . Diabetes ([place]) 02/24/2017  . COPD (chronic obstructive pulmonary disease) ([place]) 02/24/2017  . Neck pain 04/19/2016  . Carcinoma of overlapping sites of left breast in female, estrogen receptor positive ([place]) 03/22/2016    Past Surgical History:  Procedure Laterality Date  . ABDOMINAL HYSTERECTOMY    . BREAST SURGERY Right 2015   mastectomy  . ESOPHAGOGASTRODUODENOSCOPY N/A 02/25/2017   Procedure: ESOPHAGOGASTRODUODENOSCOPY (EGD);  Surgeon: Jonathon Bellows, MD;  [facility] END[facility]COPY;  Service: Endoscopy;  Laterality: N/A;  . SIMPLE MASTECTOMY WITH AXILLARY SENTINEL NODE BIOPSY Left 02/29/2016   Procedure: SIMPLE MASTECTOMY;  Surgeon: Christene Lye, MD;  Location: [facility] ORS;  Service: General;  Laterality: Left;  . TUBAL LIGATION      Prior to Admission medications   Medication Sig Start Date End Date Taking? Authorizing Provider  ADVAIR DISKUS 500-50 MCG/DOSE AEPB Inhale 1 puff into the lungs 2 (two) times daily.  05/04/15   [provider]  albuterol (PROVENTIL HFA;VENTOLIN HFA) 108 (90 Base) MCG/ACT inhaler Inhale 2 puffs into the lungs every 6 (six) hours as needed for wheezing or shortness of breath.    [provider]  aspirin 81 MG tablet Take 81 mg by mouth daily.    [provider]  atorvastatin (LIPITOR) 10 MG tablet Take 10 mg by mouth daily.    [provider]  docusate sodium (COLACE) 100 MG  capsule Take 100 mg by mouth 2 (two) times daily.    [provider]  fluticasone (FLONASE) 50 MCG/ACT nasal spray Place 2 sprays into both nostrils daily.    [provider]  GLIPIZIDE XL 2.5 MG 24 hr tablet Take 2.5 mg by mouth daily with breakfast.  06/23/15   [provider]  hydrochlorothiazide (HYDRODIURIL) 12.5 MG tablet Take 12.5 mg by  mouth daily. 02/08/17   [provider]  letrozole (FEMARA) 2.5 MG tablet Take 1 tablet (2.5 mg total) by mouth daily. 11/01/16   [name], MD  lisinopril (PRINIVIL,ZESTRIL) 40 MG tablet Take 40 mg by mouth every morning.  06/23/15   [provider]  loratadine (CLARITIN) 10 MG tablet Take 10 mg by mouth daily.    [provider]  metoCLOPramide (REGLAN) 10 MG tablet Take 1 tablet (10 mg total) by mouth 4 (four) times daily -  before meals and at bedtime. 02/26/17 02/26/18  Dustin Flock, MD  montelukast (SINGULAIR) 10 MG tablet Take 10 mg by mouth at bedtime.    [provider]  omeprazole (PRILOSEC) 20 MG capsule Take 20 mg by mouth every morning.  06/23/15   [provider]  oxyCODONE-acetaminophen (PERCOCET/ROXICET) 5-325 MG tablet Take 1 tablet by mouth every 4 (four) hours as needed for severe pain.    [provider]  OXYGEN Inhale 3 L into the lungs continuous.     [provider]  palbociclib (IBRANCE) 125 MG capsule Take 125 mg by mouth daily with lunch.  03/14/16   [provider]  SPIRIVA HANDIHALER 18 MCG inhalation capsule Place 18 mcg into inhaler and inhale every morning.  05/04/15   [provider]    Allergies  Allergen Reactions  . Other     Onions(that grow in the yard-pt can eat onions without problems) and dust mite Sneezing, cough, runny nose    Family History  Problem Relation Age of Onset  . Lung cancer Father   . Breast cancer Neg Hx     Social History Social History  Substance Use Topics  . Smoking status: Current Some Day Smoker    Packs/day: 0.25    Years: 15.00    Types: Cigarettes    Last attempt to quit: 02/25/2016  . Smokeless tobacco: Never Used     Comment: currently as of 02-21-16 pt states she is only smoking 2-3 cigarettes per day  . 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 Cardiovascular: Chest pain this morning, mild currently Respiratory: Negative for shortness of breath. Gastrointestinal: Left-sided abdominal pain unchanged since discharge from the hospital several days ago. Positive for intermittent nausea. Denies current vomiting or diarrhea. Genitourinary: Negative for dysuria. Neurological: Negative for headache All other ROS negative  ____________________________________________   PHYSICAL EXAM:  VITAL SIGNS: ED Triage Vitals  Enc Vitals Group     BP 03/03/17 2022 93/72     Pulse Rate 03/03/17 2022 93     Resp 03/03/17 2022 18     Temp 03/03/17 2022 97.8 F (36.6 C)     Temp Source 03/03/17 2022 Oral     SpO2 03/03/17 2016 95 %     Weight --      Height --      Head Circumference --      Peak  Flow --      Pain Score 03/03/17 2021 10     Pain Loc --      Pain Edu? --      Excl. in [place]? --     Constitutional: Alert and oriented. Well appearing and in no distress. Eyes: Normal exam ENT   Head: Normocephalic and atraumatic.[name]   Mouth/Throat: Mucous membranes are moist. Cardiovascular: Normal rate, regular rhythm. No murmur Respiratory: Normal respiratory effort without tachypnea nor retractions. Breath sounds are clear  Gastrointestinal: Soft, mild to moderate left-sided abdominal tenderness, the patient states this is unchanged. Musculoskeletal: Nontender with normal range of motion in all extremities. Neurologic:  Normal speech and language. No gross focal neurologic deficits Skin:  Skin is warm, dry and intact.  Psychiatric: Mood and affect are normal.  ____________________________________________    EKG  EKG reviewed and interpreted by myself shows normal sinus rhythm at 93 bpm, normal QRS, normal axis, normal intervals with nonspecific but no concerning ST changes.  ____________________________________________    RADIOLOGY  Chest x-ray negative  for acute abnormality  ____________________________________________   INITIAL IMPRESSION / ASSESSMENT AND PLAN / ED COURSE  Pertinent labs & imaging results that were available during my care of the patient were reviewed by me and considered in my medical decision making (see chart for details).  The patient presents to the emergency department for chest discomfort beginning this morning as well as difficulty urinating. She states continued abdominal pain however states this is unchanged from her prior admission. During the patient's prior admission she had a CT scan as well as an endoscopy performed ultimately being diagnosed with gastroparesis and discharged on Reglan. We will recheck labs in the emergency department including cardiac enzymes. We will treat the patient's discomfort. She is also having significant reflux she says we will treat with Maalox as well as Reglan. The reflex could be contributing or causing the patient's chest discomfort. Overall the patient appears well, no distress.  Patient's labs have resulted showing elevated lipase 146 which was previously normal. Patient's upper abdominal/lower chest pain is likely due to acute pancreatitis. Troponin is negative. Given the patient's lipase elevation I discussed home treatment with the patient she states she is continues to be in pain in the epigastrium. Given the patient's continued pain we will re-dose pain medication and likely admit to the hospital for acute pancreatitis.  ____________________________________________   FINAL CLINICAL IMPRESSION(S) / ED DIAGNOSES  Chest pain Acute pancreatitis    Harvest Dark, MD 03/03/17 2244

## 2017-03-04 ENCOUNTER — Inpatient Hospital Stay: Payer: Medicare HMO

## 2017-03-04 ENCOUNTER — Inpatient Hospital Stay
Admit: 2017-03-04 | Discharge: 2017-03-04 | Disposition: A | Discharge status: Home / Self Care | Payer: Medicare HMO | Attending: Family Medicine | Admitting: Family Medicine

## 2017-03-04 DIAGNOSIS — Z978 Presence of other specified devices: Secondary | ICD-10-CM | POA: Diagnosis not present

## 2017-03-04 DIAGNOSIS — Z9012 Acquired absence of left breast and nipple: Secondary | ICD-10-CM

## 2017-03-04 DIAGNOSIS — R109 Unspecified abdominal pain: Secondary | ICD-10-CM | POA: Diagnosis not present

## 2017-03-04 DIAGNOSIS — Z170 Estrogen receptor positive status [ER+]: Secondary | ICD-10-CM | POA: Diagnosis not present

## 2017-03-04 DIAGNOSIS — C7951 Secondary malignant neoplasm of bone: Secondary | ICD-10-CM

## 2017-03-04 DIAGNOSIS — M899 Disorder of bone, unspecified: Secondary | ICD-10-CM | POA: Diagnosis not present

## 2017-03-04 DIAGNOSIS — E785 Hyperlipidemia, unspecified: Secondary | ICD-10-CM | POA: Diagnosis present

## 2017-03-04 DIAGNOSIS — C50812 Malignant neoplasm of overlapping sites of left female breast: Secondary | ICD-10-CM | POA: Diagnosis present

## 2017-03-04 DIAGNOSIS — Z9889 Other specified postprocedural states: Secondary | ICD-10-CM | POA: Diagnosis not present

## 2017-03-04 DIAGNOSIS — K859 Acute pancreatitis without necrosis or infection, unspecified: Secondary | ICD-10-CM | POA: Diagnosis present

## 2017-03-04 DIAGNOSIS — C50911 Malignant neoplasm of unspecified site of right female breast: Secondary | ICD-10-CM

## 2017-03-04 DIAGNOSIS — Z7982 Long term (current) use of aspirin: Secondary | ICD-10-CM | POA: Diagnosis not present

## 2017-03-04 DIAGNOSIS — Z79811 Long term (current) use of aromatase inhibitors: Secondary | ICD-10-CM

## 2017-03-04 DIAGNOSIS — F17210 Nicotine dependence, cigarettes, uncomplicated: Secondary | ICD-10-CM

## 2017-03-04 DIAGNOSIS — C784 Secondary malignant neoplasm of small intestine: Secondary | ICD-10-CM | POA: Diagnosis not present

## 2017-03-04 DIAGNOSIS — R140 Abdominal distension (gaseous): Secondary | ICD-10-CM

## 2017-03-04 DIAGNOSIS — I10 Essential (primary) hypertension: Secondary | ICD-10-CM | POA: Diagnosis present

## 2017-03-04 DIAGNOSIS — Z4659 Encounter for fitting and adjustment of other gastrointestinal appliance and device: Secondary | ICD-10-CM | POA: Diagnosis not present

## 2017-03-04 DIAGNOSIS — E43 Unspecified severe protein-calorie malnutrition: Secondary | ICD-10-CM | POA: Diagnosis present

## 2017-03-04 DIAGNOSIS — D649 Anemia, unspecified: Secondary | ICD-10-CM

## 2017-03-04 DIAGNOSIS — J449 Chronic obstructive pulmonary disease, unspecified: Secondary | ICD-10-CM | POA: Diagnosis present

## 2017-03-04 DIAGNOSIS — N179 Acute kidney failure, unspecified: Secondary | ICD-10-CM | POA: Diagnosis present

## 2017-03-04 DIAGNOSIS — K315 Obstruction of duodenum: Secondary | ICD-10-CM | POA: Diagnosis present

## 2017-03-04 DIAGNOSIS — E1143 Type 2 diabetes mellitus with diabetic autonomic (poly)neuropathy: Secondary | ICD-10-CM | POA: Diagnosis present

## 2017-03-04 DIAGNOSIS — M1990 Unspecified osteoarthritis, unspecified site: Secondary | ICD-10-CM

## 2017-03-04 DIAGNOSIS — D638 Anemia in other chronic diseases classified elsewhere: Secondary | ICD-10-CM | POA: Diagnosis present

## 2017-03-04 DIAGNOSIS — Z90710 Acquired absence of both cervix and uterus: Secondary | ICD-10-CM

## 2017-03-04 DIAGNOSIS — Z79899 Other long term (current) drug therapy: Secondary | ICD-10-CM | POA: Diagnosis not present

## 2017-03-04 DIAGNOSIS — E119 Type 2 diabetes mellitus without complications: Secondary | ICD-10-CM

## 2017-03-04 DIAGNOSIS — M8448XA Pathological fracture, other site, initial encounter for fracture: Secondary | ICD-10-CM | POA: Diagnosis present

## 2017-03-04 DIAGNOSIS — M545 Low back pain: Secondary | ICD-10-CM | POA: Diagnosis not present

## 2017-03-04 DIAGNOSIS — R5381 Other malaise: Secondary | ICD-10-CM | POA: Diagnosis not present

## 2017-03-04 DIAGNOSIS — R112 Nausea with vomiting, unspecified: Secondary | ICD-10-CM | POA: Diagnosis not present

## 2017-03-04 DIAGNOSIS — E876 Hypokalemia: Secondary | ICD-10-CM | POA: Diagnosis not present

## 2017-03-04 DIAGNOSIS — K56609 Unspecified intestinal obstruction, unspecified as to partial versus complete obstruction: Secondary | ICD-10-CM | POA: Diagnosis not present

## 2017-03-04 DIAGNOSIS — K3184 Gastroparesis: Secondary | ICD-10-CM | POA: Diagnosis present

## 2017-03-04 DIAGNOSIS — M542 Cervicalgia: Secondary | ICD-10-CM | POA: Diagnosis present

## 2017-03-04 DIAGNOSIS — C50919 Malignant neoplasm of unspecified site of unspecified female breast: Secondary | ICD-10-CM | POA: Diagnosis not present

## 2017-03-04 DIAGNOSIS — Z801 Family history of malignant neoplasm of trachea, bronchus and lung: Secondary | ICD-10-CM

## 2017-03-04 DIAGNOSIS — Z6827 Body mass index (BMI) 27.0-27.9, adult: Secondary | ICD-10-CM | POA: Diagnosis not present

## 2017-03-04 DIAGNOSIS — Z7951 Long term (current) use of inhaled steroids: Secondary | ICD-10-CM | POA: Diagnosis not present

## 2017-03-04 DIAGNOSIS — K219 Gastro-esophageal reflux disease without esophagitis: Secondary | ICD-10-CM | POA: Diagnosis present

## 2017-03-04 DIAGNOSIS — E11649 Type 2 diabetes mellitus with hypoglycemia without coma: Secondary | ICD-10-CM | POA: Diagnosis not present

## 2017-03-04 HISTORY — DX: Acute pancreatitis without necrosis or infection, unspecified: K85.90

## 2017-03-04 LAB — CBC
HCT: 30 % — ABNORMAL LOW (ref 35.0–47.0)
Hemoglobin: 10 g/dL — ABNORMAL LOW (ref 12.0–16.0)
MCH: 27.7 pg (ref 26.0–34.0)
MCHC: 33.5 g/dL (ref 32.0–36.0)
MCV: 82.6 fL (ref 80.0–100.0)
PLATELETS: 383 10*3/uL (ref 150–440)
RBC: 3.63 MIL/uL — ABNORMAL LOW (ref 3.80–5.20)
RDW: 17.8 % — AB (ref 11.5–14.5)
WBC: 10.6 10*3/uL (ref 3.6–11.0)

## 2017-03-04 LAB — BASIC METABOLIC PANEL
Anion gap: 9 (ref 5–15)
BUN: 41 mg/dL — AB (ref 6–20)
CHLORIDE: 93 mmol/L — AB (ref 101–111)
CO2: 35 mmol/L — AB (ref 22–32)
CREATININE: 1.19 mg/dL — AB (ref 0.44–1.00)
Calcium: 8.7 mg/dL — ABNORMAL LOW (ref 8.9–10.3)
GFR calc Af Amer: 53 mL/min — ABNORMAL LOW (ref >60)
GFR calc non Af Amer: 45 mL/min — ABNORMAL LOW (ref >60)
Glucose, Bld: 127 mg/dL — ABNORMAL HIGH (ref 65–99)
Potassium: 3.2 mmol/L — ABNORMAL LOW (ref 3.5–5.1)
Sodium: 137 mmol/L (ref 135–145)

## 2017-03-04 LAB — TROPONIN I

## 2017-03-04 LAB — GLUCOSE, CAPILLARY
GLUCOSE-CAPILLARY: 108 mg/dL — AB (ref 65–99)
GLUCOSE-CAPILLARY: 80 mg/dL (ref 65–99)
GLUCOSE-CAPILLARY: 63 mg/dL — AB (ref 65–99)
Glucose-Capillary: 107 mg/dL — ABNORMAL HIGH (ref 65–99)

## 2017-03-04 LAB — MAGNESIUM: Magnesium: 2.5 mg/dL — ABNORMAL HIGH (ref 1.7–2.4)

## 2017-03-04 LAB — LIPASE, BLOOD: Lipase: 111 U/L — ABNORMAL HIGH (ref 11–51)

## 2017-03-04 LAB — PHOSPHORUS: Phosphorus: 3.6 mg/dL (ref 2.5–4.6)

## 2017-03-04 LAB — TSH: TSH: 1.824 u[IU]/mL (ref 0.350–4.500)

## 2017-03-04 MED ORDER — OXYCODONE HCL 5 MG PO TABS
5.0000 mg | ORAL_TABLET | ORAL | Status: DC | PRN
  Administered 2017-03-04 (×3): 5 mg via ORAL
  Filled 2017-03-04 (×3): qty 1

## 2017-03-04 MED ORDER — TIOTROPIUM BROMIDE MONOHYDRATE 18 MCG IN CAPS
18.0000 ug | ORAL_CAPSULE | RESPIRATORY_TRACT | Status: DC
  Administered 2017-03-04 – 2017-03-12 (×6): 18 ug via RESPIRATORY_TRACT
  Filled 2017-03-04: qty 5

## 2017-03-04 MED ORDER — IPRATROPIUM BROMIDE 0.02 % IN SOLN: 0.5000 mg | Freq: Four times a day (QID) | RESPIRATORY_TRACT | Status: DC | PRN

## 2017-03-04 MED ORDER — ACETAMINOPHEN 650 MG RE SUPP: 650.0000 mg | Freq: Four times a day (QID) | RECTAL | Status: DC | PRN

## 2017-03-04 MED ORDER — HYDROCHLOROTHIAZIDE 25 MG PO TABS: 12.5000 mg | ORAL_TABLET | Freq: Every day | ORAL | Status: DC

## 2017-03-04 MED ORDER — ACETAMINOPHEN 325 MG PO TABS: 650.0000 mg | ORAL_TABLET | Freq: Four times a day (QID) | ORAL | Status: DC | PRN

## 2017-03-04 MED ORDER — PANTOPRAZOLE SODIUM 40 MG IV SOLR
40.0000 mg | INTRAVENOUS | Status: DC
  Administered 2017-03-04 – 2017-03-12 (×9): 40 mg via INTRAVENOUS
  Filled 2017-03-04 (×9): qty 40

## 2017-03-04 MED ORDER — MOMETASONE FURO-FORMOTEROL FUM 200-5 MCG/ACT IN AERO
2.0000 | INHALATION_SPRAY | Freq: Two times a day (BID) | RESPIRATORY_TRACT | Status: DC
  Administered 2017-03-04 – 2017-03-13 (×14): 2 via RESPIRATORY_TRACT
  Filled 2017-03-04 (×2): qty 8.8

## 2017-03-04 MED ORDER — MORPHINE SULFATE (PF) 2 MG/ML IV SOLN
1.0000 mg | INTRAVENOUS | Status: DC | PRN
  Administered 2017-03-05 – 2017-03-06 (×4): 1 mg via INTRAVENOUS
  Filled 2017-03-04 (×5): qty 1

## 2017-03-04 MED ORDER — LISINOPRIL 20 MG PO TABS: 40.0000 mg | ORAL_TABLET | ORAL | Status: DC

## 2017-03-04 MED ORDER — INSULIN ASPART 100 UNIT/ML ~~LOC~~ SOLN: 0.0000 [IU] | SUBCUTANEOUS | Status: DC

## 2017-03-04 MED ORDER — GI COCKTAIL ~~LOC~~
30.0000 mL | Freq: Two times a day (BID) | ORAL | Status: DC | PRN
  Filled 2017-03-04: qty 30

## 2017-03-04 MED ORDER — DOCUSATE SODIUM 100 MG PO CAPS: 100.0000 mg | ORAL_CAPSULE | Freq: Two times a day (BID) | ORAL | Status: DC

## 2017-03-04 MED ORDER — FLUTICASONE PROPIONATE 50 MCG/ACT NA SUSP
2.0000 | Freq: Every day | NASAL | Status: DC
  Administered 2017-03-05 – 2017-03-07 (×3): 2 via NASAL
  Filled 2017-03-04: qty 16

## 2017-03-04 MED ORDER — SENNOSIDES-DOCUSATE SODIUM 8.6-50 MG PO TABS
1.0000 | ORAL_TABLET | Freq: Every evening | ORAL | Status: DC | PRN
  Administered 2017-03-12: 1 via ORAL
  Filled 2017-03-04: qty 1

## 2017-03-04 MED ORDER — BISACODYL 5 MG PO TBEC: 5.0000 mg | DELAYED_RELEASE_TABLET | Freq: Every day | ORAL | Status: DC | PRN

## 2017-03-04 MED ORDER — DEXTROSE 5 % IV SOLN
1.0000 g | INTRAVENOUS | Status: DC
  Administered 2017-03-04 – 2017-03-06 (×3): 1 g via INTRAVENOUS
  Filled 2017-03-04 (×4): qty 10

## 2017-03-04 MED ORDER — ONDANSETRON HCL 4 MG/2ML IJ SOLN: 4.0000 mg | Freq: Four times a day (QID) | INTRAMUSCULAR | Status: DC | PRN

## 2017-03-04 MED ORDER — PALBOCICLIB 125 MG PO CAPS
125.0000 mg | ORAL_CAPSULE | Freq: Every day | ORAL | Status: DC
  Administered 2017-03-12: 125 mg via ORAL
  Filled 2017-03-04: qty 1

## 2017-03-04 MED ORDER — SODIUM CHLORIDE 0.9 % IV SOLN
INTRAVENOUS | Status: DC
  Administered 2017-03-04: 03:00:00 via INTRAVENOUS

## 2017-03-04 MED ORDER — ATORVASTATIN CALCIUM 10 MG PO TABS: 10.0000 mg | ORAL_TABLET | Freq: Every day | ORAL | Status: DC

## 2017-03-04 MED ORDER — NITROGLYCERIN 0.4 MG SL SUBL: 0.4000 mg | SUBLINGUAL_TABLET | SUBLINGUAL | Status: DC | PRN

## 2017-03-04 MED ORDER — IOPAMIDOL (ISOVUE-300) INJECTION 61%
15.0000 mL | INTRAVENOUS | Status: AC
  Administered 2017-03-04 (×2): 15 mL via ORAL

## 2017-03-04 MED ORDER — OXYCODONE-ACETAMINOPHEN 5-325 MG PO TABS
1.0000 | ORAL_TABLET | ORAL | Status: DC | PRN
  Administered 2017-03-04 – 2017-03-09 (×4): 1 via ORAL
  Filled 2017-03-04 (×4): qty 1

## 2017-03-04 MED ORDER — ONDANSETRON HCL 4 MG PO TABS: 4.0000 mg | ORAL_TABLET | Freq: Four times a day (QID) | ORAL | Status: DC | PRN

## 2017-03-04 MED ORDER — METOCLOPRAMIDE HCL 5 MG/ML IJ SOLN: 10.0000 mg | Freq: Three times a day (TID) | INTRAMUSCULAR | Status: DC | PRN

## 2017-03-04 MED ORDER — DEXTROSE 50 % IV SOLN
25.0000 mL | Freq: Once | INTRAVENOUS | Status: DC
  Filled 2017-03-04: qty 50

## 2017-03-04 MED ORDER — POTASSIUM CHLORIDE IN NACL 20-0.9 MEQ/L-% IV SOLN
INTRAVENOUS | Status: DC
  Administered 2017-03-04: 12:00:00 via INTRAVENOUS
  Filled 2017-03-04 (×4): qty 1000

## 2017-03-04 MED ORDER — ALBUTEROL SULFATE (2.5 MG/3ML) 0.083% IN NEBU: 2.5000 mg | INHALATION_SOLUTION | Freq: Four times a day (QID) | RESPIRATORY_TRACT | Status: DC | PRN

## 2017-03-04 MED ORDER — MONTELUKAST SODIUM 10 MG PO TABS
10.0000 mg | ORAL_TABLET | Freq: Every day | ORAL | Status: DC
  Administered 2017-03-09 – 2017-03-12 (×4): 10 mg via ORAL
  Filled 2017-03-04 (×4): qty 1

## 2017-03-04 MED ORDER — LORATADINE 10 MG PO TABS
10.0000 mg | ORAL_TABLET | Freq: Every day | ORAL | Status: DC
  Administered 2017-03-04 – 2017-03-13 (×6): 10 mg via ORAL
  Filled 2017-03-04 (×6): qty 1

## 2017-03-04 MED ORDER — LETROZOLE 2.5 MG PO TABS
2.5000 mg | ORAL_TABLET | Freq: Every day | ORAL | Status: DC
  Administered 2017-03-04 – 2017-03-13 (×6): 2.5 mg via ORAL
  Filled 2017-03-04 (×11): qty 1

## 2017-03-04 MED ORDER — MAGNESIUM CITRATE PO SOLN
1.0000 | Freq: Once | ORAL | Status: DC | PRN
  Filled 2017-03-04: qty 296

## 2017-03-04 MED ORDER — ENOXAPARIN SODIUM 40 MG/0.4ML ~~LOC~~ SOLN
40.0000 mg | SUBCUTANEOUS | Status: DC
  Administered 2017-03-04 – 2017-03-08 (×5): 40 mg via SUBCUTANEOUS
  Filled 2017-03-04 (×5): qty 0.4

## 2017-03-04 MED ORDER — ASPIRIN EC 81 MG PO TBEC: 81.0000 mg | DELAYED_RELEASE_TABLET | Freq: Every day | ORAL | Status: DC

## 2017-03-04 NOTE — Consult Note (Addendum)
Sophia Lame, MD [address] [place], [place] [address] Phone: [phone] Fax : [phone]  Consultation  Referring Provider:     Dr. Ara Kussmaul Primary Care Physician:  Donnie Coffin, MD Primary Gastroenterologist:  Dr. Vicente Males         Reason for Consultation:     pancreatitis  Date of Admission:  03/03/2017 Date of Consultation:  03/04/2017         HPI:   Sophia Nelson is a 71 y.o. female who is admitted with a slightly elevated lipase and a GI consult was called.  The patient recently was admitted for a small bowel obstruction.  The patient underwent an upper endoscopy by Dr. Vicente Males who got the scope down to the jejunum without any obstruction seen.  The patient had residual food in the stomach.  The patient was now admitted again with the same symptoms and a x-ray showed the patient to have obstruction at the level of the ligament of Treitz.  The patient had an NG tube placed with no drainage coming from the NG tube.  She states that she has severe abdominal pain.  The patient has a history of metastatic breast cancer.  During last admission the patient was treated conservatively and was able to tolerate by mouth's and was sent home.  Past Medical History:  Diagnosis Date  . Anemia   . Arthritis   . Asthma   . 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 Cancer of left breast ([place]) 08/09/2015   T4 N1 M1 tumor, INVASIVE LOBULAR CARCINOMA.  [name] COPD (chronic obstructive pulmonary disease) ([place])   . Diabetes mellitus without complication ([place])   . GERD (gastroesophageal reflux disease)   . Headache    h/o migraines as a child  . Hypertension   . Pneumonia 2015  . Shortness of breath dyspnea    with exertion    Past Surgical History:  Procedure Laterality Date  . ABDOMINAL HYSTERECTOMY    . BREAST SURGERY Right 2015   mastectomy  . ESOPHAGOGASTRODUODENOSCOPY N/A 02/25/2017   Procedure: ESOPHAGOGASTRODUODENOSCOPY (EGD);  Surgeon: Jonathon Bellows, MD;   Location[facility] ENDOSCOPY;  Service: Endoscopy;  Laterality: N/A;  . SIMPLE MASTECTOMY WITH AXILLARY SENTINEL NODE BIOPSY Left 02/29/2016   Procedure: SIMPLE MASTECTOMY;  Surgeon: Christene Lye, MD;  Location: [facility] ORS;  Service: General;  Laterality: Left;  . TUBAL LIGATION      Prior to Admission medications   Medication Sig Start Date End Date Taking? Authorizing Provider  ADVAIR DISKUS 500-50 MCG/DOSE AEPB Inhale 1 puff into the lungs 2 (two) times daily.  05/04/15  Yes [provider]  albuterol (PROVENTIL HFA;VENTOLIN HFA) 108 (90 Base) MCG/ACT inhaler Inhale 2 puffs into the lungs every 6 (six) hours as needed for wheezing or shortness of breath.   Yes [provider]  aspirin 81 MG tablet Take 81 mg by mouth daily.   Yes [provider]  atorvastatin (LIPITOR) 10 MG tablet Take 10 mg by mouth daily.   Yes [provider]  docusate sodium (COLACE) 100 MG capsule Take 100 mg by mouth 2 (two) times daily.   Yes [provider]  fluticasone (FLONASE) 50 MCG/ACT nasal spray Place 2 sprays into both nostrils daily.   Yes [provider]  GLIPIZIDE XL 2.5 MG 24 hr tablet Take 2.5 mg by mouth daily with breakfast.  06/23/15  Yes [provider]  hydrochlorothiazide (HYDRODIURIL) 12.5 MG tablet  Take 12.5 mg by mouth daily. 02/08/17  Yes [provider]  letrozole (FEMARA) 2.5 MG tablet Take 1 tablet (2.5 mg total) by mouth daily. 11/01/16  Yes [name], MD  lisinopril (PRINIVIL,ZESTRIL) 40 MG tablet Take 40 mg by mouth every morning.  06/23/15  Yes [provider]  loratadine (CLARITIN) 10 MG tablet Take 10 mg by mouth daily.   Yes [provider]  metoCLOPramide (REGLAN) 10 MG tablet Take 1 tablet (10 mg total) by mouth 4 (four) times daily -  before meals and at bedtime. 02/26/17 02/26/18 Yes Dustin Flock, MD  montelukast (SINGULAIR) 10 MG tablet Take 10 mg by mouth at bedtime.   Yes [provider]  omeprazole (PRILOSEC) 20 MG capsule Take 20 mg by mouth every morning.  06/23/15  Yes [provider]  oxyCODONE-acetaminophen (PERCOCET/ROXICET) 5-325 MG tablet Take 1 tablet by mouth every 4 (four) hours as needed for severe pain.   Yes [provider]  OXYGEN Inhale 3 L into the lungs continuous.    Yes [provider]  palbociclib (IBRANCE) 125 MG capsule Take 125 mg by mouth daily with lunch.  03/14/16  Yes [provider]  SPIRIVA HANDIHALER 18 MCG inhalation capsule Place 18 mcg into inhaler and inhale every morning.  05/04/15  Yes [provider]    Family History  Problem Relation Age of Onset  . Lung cancer Father   . Breast cancer Neg Hx      Social History  Substance Use Topics  . Smoking status: Current Some Day Smoker    Packs/day: 0.25    Years: 15.00    Types: Cigarettes    Last attempt to quit: 02/25/2016  . Smokeless tobacco: Never Used     Comment: currently as of 02-21-16 pt states she is only smoking 2-3 cigarettes per day  . 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 Other  02/20/2016    Review of Systems:    All systems reviewed and negative except where noted in HPI.   Physical Exam:  Vital signs in last 24 hours: Temp:  [97.8 F (36.6 C)-98.3 F (36.8 C)] 98.2 F (36.8 C) (07/09 0600) Pulse Rate:  [76-96] 77 (07/09 0600) Resp:  [13-23] 20 (07/09 0600) BP: (93-144)/(42-108) 101/42 (07/09 0600) SpO2:  [95 %-100 %] 100 % (07/09 0600) Weight:  [152 lb 3.2 oz (69 kg)] 152 lb 3.2 oz (69 kg) (07/09 0100) Last BM Date: 03/02/17 General:   Pleasant, cooperative in NAD Head:  Normocephalic and atraumatic. Eyes:   No icterus.   Conjunctiva pink. PERRLA. Ears:  Normal auditory acuity. Neck:  Supple; no masses or  thyroidomegaly Lungs: Respirations even and unlabored. Lungs clear to auscultation bilaterally.   No wheezes, crackles, or rhonchi.  Heart:  Regular rate and rhythm;  Without murmur, clicks, rubs or gallops Abdomen:  Distended with tympany and diffusely tender No appreciable masses or hepatomegaly.  No rebound or guarding.  Rectal:  Not performed. Msk:  Symmetrical without gross deformities.    Extremities:  Without edema, cyanosis or clubbing. Neurologic:  Alert and oriented x3;  grossly normal neurologically. Skin:  Intact without significant lesions or rashes. Cervical Nodes:  No significant cervical adenopathy. Psych:  Alert and cooperative. Normal affect.  LAB RESULTS:  Recent Labs  03/03/17 2027 03/04/17 0503  WBC 12.8* 10.6  HGB 10.6* 10.0*  HCT 31.1* 30.0*  PLT 409 383   BMET  Recent Labs  03/03/17 2140 03/04/17 0503  NA 137 137  K 4.0 3.2*  CL 91* 93*  CO2 32 35*  GLUCOSE 133* 127*  BUN 45* 41*  CREATININE 1.49* 1.19*  CALCIUM 9.0 8.7*   LFT  Recent Labs  03/03/17 2140  PROT 6.7  ALBUMIN 3.4*  AST 23  ALT 11*  ALKPHOS 70  BILITOT 1.0  BILIDIR 0.1  IBILI 0.9   PT/INR No results for input(s): LABPROT, INR in the last 72 hours.  STUDIES: Ct Abdomen Pelvis Wo Contrast  Result Date: 03/04/2017 CLINICAL DATA:  71 year old diabetic hypertensive female with abdominal pain and nausea found to have gastroparesis/outlet obstruction. Chest pain associated with indigestion. No bowel movement for 3 days. Right breast cancer 2009 post mastectomy. Left breast cancer diagnosed 2016. Subsequent encounter. EXAM: CT ABDOMEN AND PELVIS WITHOUT CONTRAST TECHNIQUE: Multidetector CT imaging of the abdomen and pelvis was performed following the standard protocol without IV contrast. COMPARISON:  02/24/2017 plain film exam and CT.  12/20/2015 PET-CT. FINDINGS: Lower chest: Elevated left hemidiaphragm (cause by dilated stomach) with basilar atelectasis greater on the left.  Coronary artery calcifications. Hepatobiliary: Taking into account limitation by non contrast imaging, no obvious mass. Calcified gallstones. Limited for evaluating for the possibility of inflammation given the small amount of adjacent fluid. Pancreas: Taking into account limitation by non contrast imaging, no obvious mass or primary pancreatic inflammatory process. Spleen: Taking into account limitation by non contrast imaging, no mass or enlargement. Adrenals/Urinary Tract: Minimal nodularity left adrenal gland unchanged. Mild chronic fullness collecting systems greater on right without obstructing stone identified. Slightly lobular contour the kidneys without significant change. Evaluation for detection of a mass is limited by lack of contrast. Partially decompressed urinary bladder with mild thickened wall. Stomach/Bowel: Markedly dilated predominantly fluid-filled stomach and duodenum to the level of the ligament of Treitz. Mild infiltration of surrounding fat planes and prominent surrounding vessels. Small amount of fluid upper and lower abdomen without drainable fluid collection or free intraperitoneal air. Cause of proximal small-bowel obstruction is indeterminate. Underlying mass or adhesion is a possibility. No definitive findings of malrotation. No primary colonic abnormality.  Appendix unremarkable. Vascular/Lymphatic: Prominent calcified plaque aorta and aortic branch vessels without aneurysm noted. Top-normal size upper abdominal lymph nodes stable. Reproductive: Post hysterectomy.  No worrisome adnexal abnormality. Other: No bowel containing hernia. Musculoskeletal: Left L3 lytic lesion involving the vertebral body and pedicle with pathologic fracture left L3 superior endplate. Findings suggestive of metastatic disease. IMPRESSION: 1. Markedly dilated predominantly fluid-filled stomach and duodenum to the level of the ligament of Treitz (or just beyond). Mild infiltration of surrounding fat planes and  prominent surrounding vessels. Cause of proximal small-bowel obstruction is indeterminate. With patient's history of breast cancer and pathologic fracture as discussed below, it is possible findings are related to tumor although adhesions could cause a similar appearance. Patient would benefit from nasogastric tube decompression. 2. Left L3 lytic lesion involving the vertebral body and pedicle with pathologic fracture left L3 superior endplate. Findings suggestive of metastatic disease. 3. Top-normal size upper abdominal lymph nodes unchanged. 4. Mild chronic fullness collecting systems greater on right without obstructing stone identified. Slightly lobular contour the kidneys without significant change. Evaluation for detection of a mass is limited by lack of contrast. 5. Minimal nodular left adrenal gland. 6. Elevated left hemidiaphragm secondary to  dilated stomach with left base atelectasis. 7.  Aortic atherosclerosis. 8. Gallstones. Limited for detecting inflammation given the small amount of fluid in the upper abdomen. Electronically Signed   By: Genia Del M.D.   On: 03/04/2017 07:46   Dg Chest 2 View  Result Date: 03/04/2017 CLINICAL DATA:  Post NG TUBE placement EXAM: CHEST - 2 VIEW COMPARISON:  03/03/2017 FINDINGS: Nasogastric tube has been advanced into the gastric lumen. Blunting of the left costophrenic angle suggesting small effusion. Persistent infiltrate atelectasis or scarring at the left lung base. Right lung clear. Heart size normal. Atheromatous aorta. No pneumothorax Anterior vertebral endplate spurring at multiple levels in the mid and lower thoracic spine. IMPRESSION: 1. Nasogastric tube to the stomach. 2. Small left pleural effusion with atelectasis/infiltrate or scarring at the left lung base. Electronically Signed   By: Lucrezia Europe M.D.   On: 03/04/2017 12:40   Dg Chest 2 View  Result Date: 03/03/2017 CLINICAL DATA:  Chest pain and weakness EXAM: CHEST  2 VIEW COMPARISON:  Chest  radiograph and chest CT February 24, 2017 FINDINGS: There is atelectatic change in the lung bases. Lungs elsewhere are clear. Heart is upper normal in size with pulmonary vascularity within normal limits. There is aortic atherosclerosis. There is mild degenerative change in thoracic spine. IMPRESSION: Bibasilar atelectasis. No edema or consolidation. Stable cardiac silhouette. There is aortic atherosclerosis. Aortic Atherosclerosis (ICD10-I70.0). Electronically Signed   By: Lowella Grip III M.D.   On: 03/03/2017 21:05   Dg Abd 1 View  Result Date: 03/04/2017 CLINICAL DATA:  Nasogastric tube placement. EXAM: ABDOMEN - 1 VIEW COMPARISON:  Radiographs February 24, 2017. FINDINGS: The bowel gas pattern is normal. Nasogastric tube tip is seen in proximal stomach. IMPRESSION: Nasogastric tube tip seen in proximal stomach. No evidence of bowel obstruction or ileus. Electronically Signed   By: Marijo Conception, M.D.   On: 03/04/2017 11:42      Impression / Plan:   Sophia Nelson is a 71 y.o. y/o female with neck breast cancer with a recent admission to the hospital for small bowel obstruction.  An EGD did not see that cause of the small bowel obstruction.  The patient NG-tube was not draining during my visit today.  I manually took a syringe and was able to pull 500 cc through the NG tube and down the suction catheter on the wall to have not been set up correctly.  The suction apparatus was prepared and 2 L of fluid was taken off the patient due to the NG tube. I spoke to surgery and we have agreed that a repeat upper endoscopy is not needed at this time.  The patient will need a small bowel follow through versus a CT enterography.  If this is an adhesion or a site of metastasis both would require surgical intervention and there is not much to be done from a GI point of view.  I explained this to the patient.  The patient also clearly does not have pancreatitis without meeting the criteria of the lipase being 3 times  the upper limit of normal with a CT scan finding consistent with pancreatitis in conjunction with pain consistent with pancreatic enzymes. We will follow the patient along with you.  Thank you for involving me in the care of this patient.      LOS: 0 days   Sophia Lame, MD  03/04/2017, 6:29 PM   Note: This dictation was prepared with Dragon dictation along with smaller phrase  technology. Any transcriptional errors that result from this process are unintentional.

## 2017-03-04 NOTE — Plan of Care (Signed)
Problem: Pain Managment: Goal: General experience of comfort will improve Outcome: Not Progressing Patient experiencing intermittent pain. Will continue to assess.

## 2017-03-04 NOTE — Progress Notes (Signed)
*  PRELIMINARY RESULTS* Echocardiogram 2D Echocardiogram has been performed.  [name] 03/04/2017, 3:08 PM

## 2017-03-04 NOTE — Consult Note (Signed)
[place] CONSULT NOTE  Patient Care Team: Donnie Coffin, MD as PCP - General (Family Medicine) Donnie Coffin, MD as Referring Physician (Family Medicine) Christene Lye, MD (General Surgery) Forest Gleason, MD (Oncology)  CHIEF COMPLAINTS/PURPOSE OF CONSULTATION:  Metastatic breast cancer  HISTORY OF PRESENTING ILLNESS:  Sophia Nelson 71 y.o.  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  Patient was recently at [facility] for small bowel obstruction- EGD was performed that showed obstructive changes. However as patient symptomatically improved- patient got discharged; with outpatient follow-up/workup for small bowel enterography.   Unfortunately patient presented back to the hospital yesterday with nausea vomiting abdominal distention. CT scan showed- again gastric distention/obstruction at the level of small bowel- with a clear sign of causing obstruction. Patient currently has NG tube in place. She is awaiting a surgical evaluation/evaluation with GI.  Denies any fevers or chills. Feels poorly. Positive for nausea with vomiting. Abdominal pain.   ROS: A complete 10 point review of system is done which is negative except mentioned above in history of present illness  MEDICAL HISTORY:  Past Medical History:  Diagnosis Date  . Anemia   . Arthritis   . Asthma   . Breast cancer ([name]) 2009   RT MASTECTOMY, DCIS  . Cancer of left breast ([place]) 08/09/2015   T4 N1 M1 tumor, INVASIVE LOBULAR CARCINOMA.  [name] COPD (chronic obstructive pulmonary disease) ([place])   . Diabetes mellitus without complication ([place])   . GERD (gastroesophageal reflux disease)   . Headache    h/o migraines as a child  . Hypertension   . Pneumonia 2015  . Shortness of breath dyspnea    with exertion     SURGICAL HISTORY: Past Surgical History:  Procedure Laterality Date  . ABDOMINAL HYSTERECTOMY    . BREAST SURGERY Right 2015   mastectomy  . ESOPHAGOGASTRODUODENOSCOPY N/A 02/25/2017   Procedure: ESOPHAGOGASTRODUODENOSCOPY (EGD);  Surgeon: Jonathon Bellows, MD;  Location: [facility] ENDOSCOPY;  Service: Endoscopy;  Laterality: N/A;  . SIMPLE MASTECTOMY WITH AXILLARY SENTINEL NODE BIOPSY Left 02/29/2016   Procedure: SIMPLE MASTECTOMY;  Surgeon: Christene Lye, MD;  Location: [facility] ORS;  Service: General;  Laterality: Left;  . TUBAL LIGATION      SOCIAL HISTORY: Social History   Social History  . Marital status: Divorced    Spouse name: N/A  . Number of children: N/A  . Years of education: N/A   Occupational History  . Not on file.   Social History Main Topics  . Smoking status: Current Some Day Smoker    Packs/day: 0.25    Years: 15.00    Types: Cigarettes    Last attempt to quit: 02/25/2016  . Smokeless tobacco: Never Used     Comment: currently as of 02-21-16 pt states she is only smoking 2-3 cigarettes per day  . 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 Drug use: No  . Sexual activity: Not on file   Other Topics Concern  . Not on file   Social History Narrative  . No narrative on file    FAMILY HISTORY: Family History  Problem Relation Age of Onset  . Lung cancer Father   . Breast cancer  Neg Hx     ALLERGIES:  is allergic to other.  MEDICATIONS:  Current Facility-Administered Medications  Medication Dose Route Frequency Provider Last Rate Last Dose  . 0.9 % NaCl with KCl 20 mEq/ L  infusion   Intravenous Continuous Loletha Grayer, MD 75 mL/hr at 03/04/17 1155    . acetaminophen (TYLENOL) tablet 650 mg  650 mg Oral Q6H PRN Hugelmeyer, Alexis, DO       Or  . acetaminophen (TYLENOL) suppository 650 mg  650 mg Rectal Q6H PRN Hugelmeyer, Alexis, DO      . albuterol  (PROVENTIL) (2.5 MG/3ML) 0.083% nebulizer solution 2.5 mg  2.5 mg Nebulization Q6H PRN Hugelmeyer, Alexis, DO      . cefTRIAXone (ROCEPHIN) 1 g in dextrose 5 % 50 mL IVPB  1 g Intravenous Q24H Loletha Grayer, MD   Stopped at 03/04/17 1225  . enoxaparin (LOVENOX) injection 40 mg  40 mg Subcutaneous Q24H Hugelmeyer, Alexis, DO   40 mg at 03/04/17 0626  . fluticasone (FLONASE) 50 MCG/ACT nasal spray 2 spray  2 spray Each Nare Daily Hugelmeyer, Alexis, DO      . insulin aspart (novoLOG) injection 0-9 Units  0-9 Units Subcutaneous Q4H Hugelmeyer, Alexis, DO      . ipratropium (ATROVENT) nebulizer solution 0.5 mg  0.5 mg Nebulization Q6H PRN Hugelmeyer, Alexis, DO      . letrozole [facility]) tablet 2.5 mg  2.5 mg Oral Daily Hugelmeyer, Alexis, DO   2.5 mg at 03/04/17 1203  . loratadine (CLARITIN) tablet 10 mg  10 mg Oral Daily Hugelmeyer, Alexis, DO   10 mg at 03/04/17 1154  . magnesium citrate solution 1 Bottle  1 Bottle Oral Once PRN Hugelmeyer, Alexis, DO      . metoCLOPramide (REGLAN) injection 10 mg  10 mg Intravenous Q8H PRN Hugelmeyer, Alexis, DO      . mometasone-formoterol (DULERA) 200-5 MCG/ACT inhaler 2 puff  2 puff Inhalation BID Hugelmeyer, Alexis, DO   2 puff at 03/04/17 1153  . montelukast (SINGULAIR) tablet 10 mg  10 mg Oral QHS Hugelmeyer, Alexis, DO      . morphine 2 MG/ML injection 1 mg  1 mg Intravenous Q4H PRN Hugelmeyer, Alexis, DO      . nitroGLYCERIN (NITROSTAT) SL tablet 0.4 mg  0.4 mg Sublingual Q5 min PRN Hugelmeyer, Alexis, DO      . ondansetron (ZOFRAN) tablet 4 mg  4 mg Oral Q6H PRN Hugelmeyer, Alexis, DO       Or  . ondansetron (ZOFRAN) injection 4 mg  4 mg Intravenous Q6H PRN Hugelmeyer, Alexis, DO      . oxyCODONE (Oxy IR/ROXICODONE) immediate release tablet 5 mg  5 mg Oral Q4H PRN Hugelmeyer, Alexis, DO   5 mg at 03/04/17 1717  . oxyCODONE-acetaminophen (PERCOCET/ROXICET) 5-325 MG per tablet 1 tablet  1 tablet Oral Q4H PRN Hugelmeyer, Alexis, DO   1 tablet at 03/04/17  1717  . palbociclib (IBRANCE) capsule 125 mg  125 mg Oral Q lunch Hugelmeyer, Alexis, DO      . pantoprazole (PROTONIX) injection 40 mg  40 mg Intravenous Q24H Hugelmeyer, Alexis, DO   40 mg at 03/04/17 0626  . senna-docusate (Senokot-S) tablet 1 tablet  1 tablet Oral QHS PRN Hugelmeyer, Alexis, DO      . tiotropium [facility]) inhalation capsule 18 mcg  18 mcg Inhalation BH-q7a Hugelmeyer, Alexis, DO   18 mcg at 03/04/17 0630      .  PHYSICAL EXAMINATION:  Vitals:   03/04/17  0100 03/04/17 0600  BP:  (!) 101/42  Pulse:  77  Resp:  20  Temp: 98.3 F (36.8 C) 98.2 F (36.8 C)   Filed Weights   03/04/17 0100  Weight: 152 lb 3.2 oz (69 kg)    GENERAL: Well-nourished well-developed; Alert, mild distress and comfortable.   Alone. NG tube in place. EYES: no pallor or icterus OROPHARYNX: no thrush or ulceration. NECK: supple, no masses felt LYMPH:  no palpable lymphadenopathy in the cervical, axillary or inguinal regions LUNGS: decreased breath sounds to auscultation at bases and  No wheeze or crackles HEART/CVS: regular rate & rhythm and no murmurs; No lower extremity edema ABDOMEN: abdomen soft, tender and distended. Musculoskeletal:no cyanosis of digits and no clubbing  PSYCH: alert & oriented x 3 with fluent speech NEURO: no focal motor/sensory deficits SKIN:  no rashes or significant lesions  LABORATORY DATA:  I have reviewed the data as listed Lab Results  Component Value Date   WBC 10.6 03/04/2017   HGB 10.0 (L) 03/04/2017   HCT 30.0 (L) 03/04/2017   MCV 82.6 03/04/2017   PLT 383 03/04/2017    Recent Labs  02/13/17 1430 02/24/17 1630 02/25/17 0450 03/03/17 2140 03/04/17 0503  NA 138 136 139 137 137  K 4.0 3.9 3.5 4.0 3.2*  CL 102 88* 97* 91* 93*  CO2 27 31 29  32 35*  GLUCOSE 105* 136* 117* 133* 127*  BUN 14 39* 44* 45* 41*  CREATININE 0.86 3.65* 2.64* 1.49* 1.19*  CALCIUM 9.4 9.7 8.6* 9.0 8.7*  GFRNONAA >60 12* 17* 35* 45*  GFRAA >60 14* 20* 40* 53*   PROT 7.3 8.9*  --  6.7  --   ALBUMIN 3.8 4.5  --  3.4*  --   AST 16 21  --  23  --   ALT 7* 9*  --  11*  --   ALKPHOS 83 94  --  70  --   BILITOT 0.4 0.9  --  1.0  --   BILIDIR  --   --   --  0.1  --   IBILI  --   --   --  0.9  --     RADIOGRAPHIC STUDIES: I have personally reviewed the radiological images as listed and agreed with the findings in the report. Ct Abdomen Pelvis Wo Contrast  Result Date: 03/04/2017 CLINICAL DATA:  71 year old diabetic hypertensive female with abdominal pain and nausea found to have gastroparesis/outlet obstruction. Chest pain associated with indigestion. No bowel movement for 3 days. Right breast cancer 2009 post mastectomy. Left breast cancer diagnosed 2016. Subsequent encounter. EXAM: CT ABDOMEN AND PELVIS WITHOUT CONTRAST TECHNIQUE: Multidetector CT imaging of the abdomen and pelvis was performed following the standard protocol without IV contrast. COMPARISON:  02/24/2017 plain film exam and CT.  12/20/2015 PET-CT. FINDINGS: Lower chest: Elevated left hemidiaphragm (cause by dilated stomach) with basilar atelectasis greater on the left. Coronary artery calcifications. Hepatobiliary: Taking into account limitation by non contrast imaging, no obvious mass. Calcified gallstones. Limited for evaluating for the possibility of inflammation given the small amount of adjacent fluid. Pancreas: Taking into account limitation by non contrast imaging, no obvious mass or primary pancreatic inflammatory process. Spleen: Taking into account limitation by non contrast imaging, no mass or enlargement. Adrenals/Urinary Tract: Minimal nodularity left adrenal gland unchanged. Mild chronic fullness collecting systems greater on right without obstructing stone identified. Slightly lobular contour the kidneys without significant change. Evaluation for detection of a mass is limited by lack  of contrast. Partially decompressed urinary bladder with mild thickened wall. Stomach/Bowel: Markedly  dilated predominantly fluid-filled stomach and duodenum to the level of the ligament of Treitz. Mild infiltration of surrounding fat planes and prominent surrounding vessels. Small amount of fluid upper and lower abdomen without drainable fluid collection or free intraperitoneal air. Cause of proximal small-bowel obstruction is indeterminate. Underlying mass or adhesion is a possibility. No definitive findings of malrotation. No primary colonic abnormality.  Appendix unremarkable. Vascular/Lymphatic: Prominent calcified plaque aorta and aortic branch vessels without aneurysm noted. Top-normal size upper abdominal lymph nodes stable. Reproductive: Post hysterectomy.  No worrisome adnexal abnormality. Other: No bowel containing hernia. Musculoskeletal: Left L3 lytic lesion involving the vertebral body and pedicle with pathologic fracture left L3 superior endplate. Findings suggestive of metastatic disease. IMPRESSION: 1. Markedly dilated predominantly fluid-filled stomach and duodenum to the level of the ligament of Treitz (or just beyond). Mild infiltration of surrounding fat planes and prominent surrounding vessels. Cause of proximal small-bowel obstruction is indeterminate. With patient's history of breast cancer and pathologic fracture as discussed below, it is possible findings are related to tumor although adhesions could cause a similar appearance. Patient would benefit from nasogastric tube decompression. 2. Left L3 lytic lesion involving the vertebral body and pedicle with pathologic fracture left L3 superior endplate. Findings suggestive of metastatic disease. 3. Top-normal size upper abdominal lymph nodes unchanged. 4. Mild chronic fullness collecting systems greater on right without obstructing stone identified. Slightly lobular contour the kidneys without significant change. Evaluation for detection of a mass is limited by lack of contrast. 5. Minimal nodular left adrenal gland. 6. Elevated left  hemidiaphragm secondary to dilated stomach with left base atelectasis. 7.  Aortic atherosclerosis. 8. Gallstones. Limited for detecting inflammation given the small amount of fluid in the upper abdomen. Electronically Signed   By: Genia Del M.D.   On: 03/04/2017 07:46   Ct Abdomen Pelvis Wo Contrast  Result Date: 02/24/2017 CLINICAL DATA:  Initial evaluation for acute generalized lower/ mid abdominal pain with nausea and vomiting. Concern for obstruction. EXAM: CT CHEST, ABDOMEN AND PELVIS WITHOUT CONTRAST TECHNIQUE: Multidetector CT imaging of the chest, abdomen and pelvis was performed following the standard protocol without IV contrast. COMPARISON:  Prior CT from 11/02/2016. FINDINGS: CT CHEST FINDINGS Cardiovascular: Intrathoracic aorta of normal caliber without aneurysm. Moderate aortic atherosclerosis. Partially visualized great vessels grossly within normal limits. Heart size normal. Three vessel coronary artery calcifications. No pericardial effusion. Mediastinum/Nodes: Thyroid normal. No pathologically enlarged mediastinal, hilar, or axillary lymph nodes identified. Esophagus within normal limits. Lungs/Pleura: Tracheobronchial tree is patent. Upper lobe predominant emphysema. Linear atelectasis present within the lingula and left lower lobe. Lungs are otherwise clear. No focal infiltrates. No pulmonary edema or pleural effusion. No pneumothorax. No worrisome pulmonary nodule or mass. Musculoskeletal: No acute osseus abnormality. No worrisome lytic or blastic osseous lesions. CT ABDOMEN PELVIS FINDINGS Hepatobiliary: Limited noncontrast evaluation of the liver is unremarkable. Punctate calcification within the gallbladder may reflect a small stone versus mural calcification. No evidence for acute cholecystitis. No biliary dilatation. Pancreas: Pancreas within normal limits. Spleen: Spleen within normal limits. Adrenals/Urinary Tract: Adrenal glands within normal limits. Kidneys equal in size without  evidence for nephrolithiasis or hydronephrosis. No hydroureter. Bladder partially distended and within normal limits. Stomach/Bowel: Prominent gastric distension. Additionally, there is mild distention of the duodenum to the level of the ligament of Treitz. No obvious obstructive mass identified, although an underlying obstructive lesion could conceivably be present. Distally, small bowel is decompressed and of  normal caliber. Appendix normal. Colon is diffusely decompressed. No other acute inflammatory changes seen about the bowels. Vascular/Lymphatic: Moderate aorto bi-iliac atherosclerotic disease. No aneurysm. No appreciable adenopathy on this noncontrast examination. Reproductive: Uterus is not absent. Ovaries not discretely identified. Other: No free air or fluid. Musculoskeletal: No acute osseous abnormality. No worrisome lytic or blastic osseous lesions. IMPRESSION: 1. Prominent distension of the stomach and duodenum to the level of the ligament of Treitz, with decompression of the small bowel distally. While this finding may be transient and physiologic in nature, a possible proximal obstructive process could be considered in the correct clinical setting. No obvious mass or other abnormality identified on this noncontrast examination. Correlation with symptomatology recommended. Additionally, GI consultation with possible further evaluation with upper endoscopy could be considered as clinically warranted. 2. No other acute abnormality within the chest, abdomen, and pelvis. 3. Subcentimeter calcification within the gallbladder, which may reflect a small stone or possibly mural calcification. 4. Emphysema. 5. Moderate atherosclerosis with diffuse 3 vessel coronary artery calcifications. 6. Electronically Signed   By: Jeannine Boga M.D.   On: 02/24/2017 20:45   Dg Chest 2 View  Result Date: 03/04/2017 CLINICAL DATA:  Post NG TUBE placement EXAM: CHEST - 2 VIEW COMPARISON:  03/03/2017 FINDINGS:  Nasogastric tube has been advanced into the gastric lumen. Blunting of the left costophrenic angle suggesting small effusion. Persistent infiltrate atelectasis or scarring at the left lung base. Right lung clear. Heart size normal. Atheromatous aorta. No pneumothorax Anterior vertebral endplate spurring at multiple levels in the mid and lower thoracic spine. IMPRESSION: 1. Nasogastric tube to the stomach. 2. Small left pleural effusion with atelectasis/infiltrate or scarring at the left lung base. Electronically Signed   By: Lucrezia Europe M.D.   On: 03/04/2017 12:40   Dg Chest 2 View  Result Date: 03/03/2017 CLINICAL DATA:  Chest pain and weakness EXAM: CHEST  2 VIEW COMPARISON:  Chest radiograph and chest CT February 24, 2017 FINDINGS: There is atelectatic change in the lung bases. Lungs elsewhere are clear. Heart is upper normal in size with pulmonary vascularity within normal limits. There is aortic atherosclerosis. There is mild degenerative change in thoracic spine. IMPRESSION: Bibasilar atelectasis. No edema or consolidation. Stable cardiac silhouette. There is aortic atherosclerosis. Aortic Atherosclerosis (ICD10-I70.0). Electronically Signed   By: Lowella Grip III M.D.   On: 03/03/2017 21:05   Dg Abd 1 View  Result Date: 03/04/2017 CLINICAL DATA:  Nasogastric tube placement. EXAM: ABDOMEN - 1 VIEW COMPARISON:  Radiographs February 24, 2017. FINDINGS: The bowel gas pattern is normal. Nasogastric tube tip is seen in proximal stomach. IMPRESSION: Nasogastric tube tip seen in proximal stomach. No evidence of bowel obstruction or ileus. Electronically Signed   By: Marijo Conception, M.D.   On: 03/04/2017 11:42   Ct Chest Wo Contrast  Result Date: 02/24/2017 CLINICAL DATA:  Initial evaluation for acute generalized lower/ mid abdominal pain with nausea and vomiting. Concern for obstruction. EXAM: CT CHEST, ABDOMEN AND PELVIS WITHOUT CONTRAST TECHNIQUE: Multidetector CT imaging of the chest, abdomen and pelvis was  performed following the standard protocol without IV contrast. COMPARISON:  Prior CT from 11/02/2016. FINDINGS: CT CHEST FINDINGS Cardiovascular: Intrathoracic aorta of normal caliber without aneurysm. Moderate aortic atherosclerosis. Partially visualized great vessels grossly within normal limits. Heart size normal. Three vessel coronary artery calcifications. No pericardial effusion. Mediastinum/Nodes: Thyroid normal. No pathologically enlarged mediastinal, hilar, or axillary lymph nodes identified. Esophagus within normal limits. Lungs/Pleura: Tracheobronchial tree is patent. Upper lobe predominant  emphysema. Linear atelectasis present within the lingula and left lower lobe. Lungs are otherwise clear. No focal infiltrates. No pulmonary edema or pleural effusion. No pneumothorax. No worrisome pulmonary nodule or mass. Musculoskeletal: No acute osseus abnormality. No worrisome lytic or blastic osseous lesions. CT ABDOMEN PELVIS FINDINGS Hepatobiliary: Limited noncontrast evaluation of the liver is unremarkable. Punctate calcification within the gallbladder may reflect a small stone versus mural calcification. No evidence for acute cholecystitis. No biliary dilatation. Pancreas: Pancreas within normal limits. Spleen: Spleen within normal limits. Adrenals/Urinary Tract: Adrenal glands within normal limits. Kidneys equal in size without evidence for nephrolithiasis or hydronephrosis. No hydroureter. Bladder partially distended and within normal limits. Stomach/Bowel: Prominent gastric distension. Additionally, there is mild distention of the duodenum to the level of the ligament of Treitz. No obvious obstructive mass identified, although an underlying obstructive lesion could conceivably be present. Distally, small bowel is decompressed and of normal caliber. Appendix normal. Colon is diffusely decompressed. No other acute inflammatory changes seen about the bowels. Vascular/Lymphatic: Moderate aorto bi-iliac  atherosclerotic disease. No aneurysm. No appreciable adenopathy on this noncontrast examination. Reproductive: Uterus is not absent. Ovaries not discretely identified. Other: No free air or fluid. Musculoskeletal: No acute osseous abnormality. No worrisome lytic or blastic osseous lesions. IMPRESSION: 1. Prominent distension of the stomach and duodenum to the level of the ligament of Treitz, with decompression of the small bowel distally. While this finding may be transient and physiologic in nature, a possible proximal obstructive process could be considered in the correct clinical setting. No obvious mass or other abnormality identified on this noncontrast examination. Correlation with symptomatology recommended. Additionally, GI consultation with possible further evaluation with upper endoscopy could be considered as clinically warranted. 2. No other acute abnormality within the chest, abdomen, and pelvis. 3. Subcentimeter calcification within the gallbladder, which may reflect a small stone or possibly mural calcification. 4. Emphysema. 5. Moderate atherosclerosis with diffuse 3 vessel coronary artery calcifications. 6. Electronically Signed   By: Jeannine Boga M.D.   On: 02/24/2017 20:45   Dg Chest Portable 1 View  Result Date: 02/24/2017 CLINICAL DATA:  Initial evaluation for central line placement. EXAM: PORTABLE CHEST 1 VIEW COMPARISON:  Prior CT from 11/02/2016. FINDINGS: Right IJ approach centra venous catheter in place with tip overlying the distal SVC. Cardiac mediastinal silhouettes are within normal limits. Aortic atherosclerosis. Lungs mildly hypoinflated. Mild subsegmental atelectasis at the bilateral lung bases. No focal infiltrates. No pulmonary edema or pleural effusion. No pneumothorax. No acute osseus abnormality. IMPRESSION: 1. Right IJ approach centra venous catheter in place with tip overlying the distal SVC. 2. Shallow lung inflation with mild bibasilar subsegmental atelectasis. 3.  No other active cardiopulmonary disease. 4. Aortic atherosclerosis. Electronically Signed   By: Jeannine Boga M.D.   On: 02/24/2017 18:49   Dg Abd Portable 1 View  Result Date: 02/24/2017 CLINICAL DATA:  Nasogastric tube advancement.  Initial encounter. EXAM: PORTABLE ABDOMEN - 1 VIEW COMPARISON:  Abdominal radiograph performed earlier today at 9:43 p.m. FINDINGS: The patient's enteric tube is seen ending overlying the fundus of the stomach, with the side port about the fundus of the stomach. The visualized bowel gas pattern is grossly unremarkable. No free intra-abdominal air is seen, though evaluation for free air on a supine radiograph is limited. The visualized lung bases are clear. Mild degenerative change is noted at the lower lumbar spine. Mild sclerosis is seen at the sacroiliac joints. IMPRESSION: Enteric tube noted ending overlying the fundus of the stomach. Electronically Signed  By: Garald Balding M.D.   On: 02/24/2017 22:44   Dg Abd Portable 1 View  Result Date: 02/24/2017 CLINICAL DATA:  Evaluate NG tube placement. EXAM: PORTABLE ABDOMEN - 1 VIEW COMPARISON:  None FINDINGS: The side port of the NG tube is 7 cm above the GE junction with the tip at the GE junction. No other abnormalities. IMPRESSION: The side-port of the NG tube is 7 cm above the GE junction with the tip at the GE junction. Recommend advancement. Electronically Signed   By: Dorise Bullion III M.D   On: 02/24/2017 21:55    ASSESSMENT & PLAN:   71 year old female patient with metastatic breast cancer currently admitted the hospital for recurrent small bowel obstruction  # Metastatic lobular cancer of the breast- on letrozole+ Ibrance; most recent scan March 2018-negative for any active disease/except for treated bone metastases.   # Recurrent small bowel obstruction-unclear etiology- benign versus malignant causes. Discussed with Dr. Allen Norris.  ? Role for laparoscopy- for further evaluation/management. Await surgical  evaluation. Given the absence of obvious metastatic disease noted on the most recent CT scan- described possible that this could be a malignant obstruction as lobular cancer can spread to the serosal surfaces/peritoneum which would otherwise be hard to visualize on the imaging. Obviously need to rule out any benign causes like adhesions.  # L3 lytic lesion/metastatic disease based on imaging. Monitor for now.   # Anemia- Hb-10/ sec to Ibrance.   Thank you Dr.Weiting for allowing me to participate in the care of your pleasant patient. Please do not hesitate to contact me with questions or concerns in the interim.    [name], MD 03/04/2017 6:18 PM

## 2017-03-04 NOTE — ED Notes (Signed)
Transport to 242

## 2017-03-04 NOTE — Progress Notes (Signed)
PT Attempt Note  Patient Details Name: Sophia Nelson MRN: [id number] DOB: 1946/06/09   Evaluation Attempt:    Reason Eval/Treat Not Completed: Patient declined, no reason specified. Chart reviewed and RN consulted. Attempted to evaluate patient at 1155. Pt is laying on her side with her knees drawn up to her chest. She currently refuses physical therapy due to abdominal pain. Will attempt on later date/time as pt is able to participate.  Lyndel Safe Huprich PT, DPT   Huprich,Jason 03/04/2017, 1:31 PM

## 2017-03-04 NOTE — Plan of Care (Signed)
Problem: Pain Managment: Goal: General experience of comfort will improve Outcome: Progressing RN will assess pain often and treat patient with prn pain medication. Rn will explain pain scale and set a goal to help maintain pain.

## 2017-03-04 NOTE — Progress Notes (Signed)
#   16 n.g. Tube placed. Attached to intermittent suction. Pt tolerated fairly well. Awaiting for xray to confirm placement.

## 2017-03-04 NOTE — Care Management (Signed)
Patient able to perform her adls prior to admission.  No issues accessing medical care, obtaining medications or w transportation.  Admitted with chest pain and abdominal distention. Abd Ct reveals small bowel obstructions. Required an nasogastric tube today and npo.  GI, surgery and oncology consults pending.  Patient with history of breast cancer

## 2017-03-04 NOTE — Consult Note (Signed)
SURGICAL CONSULTATION NOTE (initial) - cpt: [id number]  HISTORY OF PRESENT ILLNESS (HPI):  71 y.o. female presented to [facility] ED with recurrence of upper abdominal pain and N/V after recently having been admitted for same and discharged home with diagnosis of gastroparesis after EGD into jejunum did not demonstrate any evidence of external compression or obstruction otherwise. Patient reports she was comfortable at home for 1 day before her upper abdominal pain, nausea, and bilious emesis recurred. Patient also has a history significant for Right breast CA s/p mastectomy (2009, [facility]) and Left breast CA s/p mastectomy (2017, [facility]), and she was recently diagnosed with a pathologic L3 fracture with radiolucency, suggestive of metastasis. Patient otherwise reports +flatus and denies fever/chills, CP, or SOB.  Surgery is consulted by medical physician Dr. Leslye Peer in this context for evaluation and management of SBO with acute pancreatitis.  PAST MEDICAL HISTORY (PMH):  Past Medical History:  Diagnosis Date  . Anemia   . Arthritis   . Asthma   . Breast cancer ([place]) 2009   RT MASTECTOMY, DCIS  . Cancer of left breast ([place]) 08/09/2015   T4 N1 M1 tumor, INVASIVE LOBULAR CARCINOMA.  [name] COPD (chronic obstructive pulmonary disease) ([place])   . Diabetes mellitus without complication ([place])   . GERD (gastroesophageal reflux disease)   . Headache    h/o migraines as a child  . Hypertension   . Pneumonia 2015  . Shortness of breath dyspnea    with exertion     PAST SURGICAL HISTORY ([place]):  Past Surgical History:  Procedure Laterality Date  . ABDOMINAL HYSTERECTOMY    . BREAST SURGERY Right 2015   mastectomy  . ESOPHAGOGASTRODUODENOSCOPY N/A 02/25/2017   Procedure: ESOPHAGOGASTRODUODENOSCOPY (EGD);  Surgeon: Jonathon Bellows, MD;  Location: [facility] ENDOSCOPY;  Service: Endoscopy;  Laterality: N/A;  . SIMPLE MASTECTOMY WITH AXILLARY SENTINEL NODE BIOPSY Left 02/29/2016   Procedure: SIMPLE MASTECTOMY;   Surgeon: Christene Lye, MD;  Location: [facility] ORS;  Service: General;  Laterality: Left;  . TUBAL LIGATION       MEDICATIONS:  Prior to Admission medications   Medication Sig Start Date End Date Taking? Authorizing Provider  ADVAIR DISKUS 500-50 MCG/DOSE AEPB Inhale 1 puff into the lungs 2 (two) times daily.  05/04/15  Yes [provider]  albuterol (PROVENTIL HFA;VENTOLIN HFA) 108 (90 Base) MCG/ACT inhaler Inhale 2 puffs into the lungs every 6 (six) hours as needed for wheezing or shortness of breath.   Yes [provider]  aspirin 81 MG tablet Take 81 mg by mouth daily.   Yes [provider]  atorvastatin (LIPITOR) 10 MG tablet Take 10 mg by mouth daily.   Yes [provider]  docusate sodium (COLACE) 100 MG capsule Take 100 mg by mouth 2 (two) times daily.   Yes [provider]  fluticasone (FLONASE) 50 MCG/ACT nasal spray Place 2 sprays into both nostrils daily.   Yes [provider]  GLIPIZIDE XL 2.5 MG 24 hr tablet Take 2.5 mg by mouth daily with breakfast.  06/23/15  Yes [provider]  hydrochlorothiazide (HYDRODIURIL) 12.5 MG tablet Take 12.5 mg by mouth daily. 02/08/17  Yes [provider]  letrozole (FEMARA) 2.5 MG tablet Take 1 tablet (2.5 mg total) by mouth daily. 11/01/16  Yes [name], MD  lisinopril (PRINIVIL,ZESTRIL) 40 MG tablet Take 40 mg by mouth every morning.  06/23/15  Yes [provider]  loratadine (CLARITIN) 10 MG tablet Take 10 mg by mouth daily.  Yes [provider]  metoCLOPramide (REGLAN) 10 MG tablet Take 1 tablet (10 mg total) by mouth 4 (four) times daily -  before meals and at bedtime. 02/26/17 02/26/18 Yes Dustin Flock, MD  montelukast (SINGULAIR) 10 MG tablet Take 10 mg by mouth at bedtime.   Yes [provider]  omeprazole (PRILOSEC) 20 MG capsule Take 20 mg by mouth every morning.  06/23/15  Yes [provider]  oxyCODONE-acetaminophen  (PERCOCET/ROXICET) 5-325 MG tablet Take 1 tablet by mouth every 4 (four) hours as needed for severe pain.   Yes [provider]  OXYGEN Inhale 3 L into the lungs continuous.    Yes [provider]  palbociclib (IBRANCE) 125 MG capsule Take 125 mg by mouth daily with lunch.  03/14/16  Yes [provider]  SPIRIVA HANDIHALER 18 MCG inhalation capsule Place 18 mcg into inhaler and inhale every morning.  05/04/15  Yes [provider]     ALLERGIES:  Allergies  Allergen Reactions  . Other     Onions(that grow in the yard-pt can eat onions without problems) and dust mite Sneezing, cough, runny nose     SOCIAL HISTORY:  Social History   Social History  . Marital status: Divorced    Spouse name: N/A  . Number of children: N/A  . Years of education: N/A   Occupational History  . Not on file.   Social History Main Topics  . Smoking status: Current Some Day Smoker    Packs/day: 0.25    Years: 15.00    Types: Cigarettes    Last attempt to quit: 02/25/2016  . Smokeless tobacco: Never Used     Comment: currently as of 02-21-16 pt states she is only smoking 2-3 cigarettes per day  . 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 Drug use: No  . Sexual activity: Not on file   Other Topics Concern  . Not on file   Social History Narrative  . No narrative on file    The patient currently resides (home / rehab facility / nursing home): Home  The patient normally is (ambulatory / bedbound): Ambulatory   FAMILY HISTORY:  Family History  Problem Relation Age of Onset  . Lung cancer Father   . Breast cancer Neg Hx     REVIEW OF SYSTEMS:  Constitutional: denies weight loss, fever, chills, or sweats  Eyes: denies any other vision changes, history of eye injury  ENT: denies sore throat, hearing problems  Respiratory: denies shortness of breath, wheezing   Cardiovascular: denies chest pain, palpitations  Gastrointestinal: abdominal pain, N/V, and bowel function as per HPI Genitourinary: denies burning with urination or urinary frequency Musculoskeletal: denies any other joint pains or cramps  Skin: denies any other rashes or skin discolorations  Neurological: denies any other headache, dizziness, weakness  Psychiatric: denies any other depression, anxiety   All other review of systems were negative   VITAL SIGNS:  Temp:  [97.8 F (36.6 C)-98.3 F (36.8 C)] 98.2 F (36.8 C) (07/09 0600) Pulse Rate:  [76-96] 77 (07/09 0600) Resp:  [13-23] 20 (07/09 0600) BP: (93-144)/(42-108) 101/42 (07/09 0600) SpO2:  [95 %-100 %] 100 % (07/09 0600) Weight:  [152 lb 3.2 oz (69 kg)] 152 lb 3.2 oz (69 kg) (07/09 0100)     Height: 5\' 2"  (157.5 cm) Weight: 152 lb 3.2 oz (69 kg)  BMI (Calculated): 27.9   INTAKE/OUTPUT:  This shift: No intake/output data recorded.  Last 2 shifts: @IOLAST2SHIFTS @   PHYSICAL EXAM:  Constitutional:  -- Normal body habitus  -- Awake, alert, and oriented x3  Eyes:  -- Pupils equally round and reactive to light  -- No scleral icterus  Ear, nose, and throat:  -- No jugular venous distension  Pulmonary:  -- No crackles  -- Equal breath sounds bilaterally -- Breathing non-labored at rest Cardiovascular:  -- S1, S2 present  -- No pericardial rubs Gastrointestinal:  -- Abdomen soft, nontender after 3 L drained from NG tube, nondistended, no guarding/rebound  -- No abdominal masses appreciated, pulsatile or otherwise  Musculoskeletal and Integumentary:  -- Wounds or skin discoloration: None appreciated -- Extremities: B/L UE and LE FROM, hands and feet warm, no edema  Neurologic:  -- Motor function: intact and symmetric -- Sensation: intact and symmetric  Labs:  CBC Latest Ref Rng & Units 03/04/2017 03/03/2017 02/25/2017  WBC 3.6 - 11.0 K/uL 10.6 12.8(H) 15.3(H)  Hemoglobin 12.0 - 16.0 g/dL 10.0(L) 10.6(L) 9.7(L)   Hematocrit 35.0 - 47.0 % 30.0(L) 31.1(L) 29.2(L)  Platelets 150 - 440 K/uL 383 409 468(H)   CMP Latest Ref Rng & Units 03/04/2017 03/03/2017 02/25/2017  Glucose 65 - 99 mg/dL 127(H) 133(H) 117(H)  BUN 6 - 20 mg/dL 41(H) 45(H) 44(H)  Creatinine 0.44 - 1.00 mg/dL 1.19(H) 1.49(H) 2.64(H)  Sodium 135 - 145 mmol/L 137 137 139  Potassium 3.5 - 5.1 mmol/L 3.2(L) 4.0 3.5  Chloride 101 - 111 mmol/L 93(L) 91(L) 97(L)  CO2 22 - 32 mmol/L 35(H) 32 29  Calcium 8.9 - 10.3 mg/dL 8.7(L) 9.0 8.6(L)  Total Protein 6.5 - 8.1 g/dL - 6.7 -  Total Bilirubin 0.3 - 1.2 mg/dL - 1.0 -  Alkaline Phos 38 - 126 U/L - 70 -  AST 15 - 41 U/L - 23 -  ALT 14 - 54 U/L - 11(L) -    Imaging studies:  CT Abdomen and Pelvis with Contrast (03/04/2017) 1. Markedly dilated predominantly fluid-filled stomach and duodenum to the level of the ligament of Treitz (or just beyond). Mild infiltration of surrounding fat planes and prominent surrounding vessels. Cause of proximal small-bowel obstruction is indeterminate. With patient's history of breast cancer and pathologic fracture as discussed below, it is possible findings are related to tumor although adhesions could cause a similar appearance. Patient would benefit from nasogastric tube decompression. 2. Left L3 lytic lesion involving the vertebral body and pedicle with pathologic fracture left L3 superior endplate. Findings suggestive of metastatic disease. 3. Top-normal size upper abdominal lymph nodes unchanged. 4. Mild chronic fullness collecting systems greater on right without obstructing stone identified. Slightly lobular contour the kidneys without significant change. Evaluation for detection of a mass is limited by lack of contrast. 5. Minimal nodular left adrenal gland. 6. Elevated left hemidiaphragm secondary to dilated stomach with left base atelectasis. 7.  Aortic atherosclerosis. 8. Gallstones. Limited for detecting inflammation given the small amount of fluid  in the upper abdomen.  CT Abdomen and Pelvis with Contrast (02/24/2017) 1. Prominent distension of the stomach and duodenum to the level of the ligament of Treitz, with decompression of the small bowel distally. While this finding may be transient and physiologic in nature, a possible proximal obstructive process could be considered in the correct clinical setting. No obvious mass or other abnormality identified on this noncontrast examination. Correlation with symptomatology recommended. Additionally, GI consultation with possible further evaluation with upper endoscopy  could be considered as clinically warranted. 2. No other acute abnormality within the chest, abdomen, and pelvis. 3. Subcentimeter calcification within the gallbladder, which may reflect a small stone or possibly mural calcification. 4. Emphysema. 5. Moderate atherosclerosis with diffuse 3 vessel coronary artery calcifications.  Assessment/Plan: (ICD-10's: K31.5, C79.9) 71 y.o. 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   - nasogastric decompression  - IVF for now and correct electrolytes  - PICC + TPN for nutrition considering prolonged NPO  - CT enterography vs EUS vs UGI pending ability to tolerate NG contrast  - G-J bypass vs G-J tube vs chemoradiation pending diagnosis  - medical management of comorbidities   - DVT prophylaxis  All of the above findings and recommendations were discussed with the patient, Dr. Allen Norris, and patient's RN, and all of patient's questions were answered to her expressed satisfaction.  Thank you for the opportunity to participate in this patient's care.   -- [name] Rosana Hoes, MD, [place]: [facility] -  Partnering for exceptional care. Office: [phone]

## 2017-03-04 NOTE — Progress Notes (Signed)
Patient ID: Sophia Nelson, female   DOB: 1946-05-22, 71 y.o.   MRN: [id number]  [employer] PROGRESS NOTE  BELISSA KOOY CNO:[id number] DOB: June 20, 1946 DOA: 03/03/2017 PCP: Donnie Coffin, MD  HPI/Subjective: Patient seen this morning. No further vomiting. Still with abdominal pain and nausea. No bowel movement in 3 days.  Objective: Vitals:   03/04/17 0100 03/04/17 0600  BP:  (!) 101/42  Pulse:  77  Resp:  20  Temp: 98.3 F (36.8 C) 98.2 F (36.8 C)    Filed Weights   03/04/17 0100  Weight: 69 kg (152 lb 3.2 oz)    ROS: Review of Systems  Constitutional: Negative for chills and fever.  Eyes: Negative for blurred vision.  Respiratory: Negative for cough and shortness of breath.   Cardiovascular: Negative for chest pain.  Gastrointestinal: Negative for constipation, diarrhea, nausea and vomiting.  Genitourinary: Negative for dysuria.  Musculoskeletal: Negative for joint pain.  Neurological: Negative for dizziness and headaches.   Exam: Physical Exam  Constitutional: She is oriented to person, place, and time.  HENT:  Nose: No mucosal edema.  Mouth/Throat: No oropharyngeal exudate or posterior oropharyngeal edema.  Eyes: Conjunctivae, EOM and lids are normal. Pupils are equal, round, and reactive to light.  Neck: No JVD present. Carotid bruit is not present. No edema present. No thyroid mass and no thyromegaly present.  Cardiovascular: S1 normal and S2 normal.  Exam reveals no gallop.   No murmur heard. Pulses:      Dorsalis pedis pulses are 2+ on the right side, and 2+ on the left side.  Respiratory: No respiratory distress. She has decreased breath sounds in the left lower field. She has no wheezes. She has no rhonchi. She has no rales.  GI: Soft. She exhibits distension. Bowel sounds are decreased. There is tenderness.  Musculoskeletal:       Right ankle: She exhibits swelling.       Left ankle: She exhibits swelling.  Lymphadenopathy:    She has no cervical  adenopathy.  Neurological: She is alert and oriented to person, place, and time. No cranial nerve deficit.  Skin: Skin is warm. No rash noted. Nails show no clubbing.  Psychiatric: She has a normal mood and affect.      Data Reviewed: Basic Metabolic Panel:  Recent Labs Lab 03/03/17 2140 03/04/17 0503  NA 137 137  K 4.0 3.2*  CL 91* 93*  CO2 32 35*  GLUCOSE 133* 127*  BUN 45* 41*  CREATININE 1.49* 1.19*  CALCIUM 9.0 8.7*  MG 2.5*  --   PHOS 3.6  --    Liver Function Tests:  Recent Labs Lab 03/03/17 2140  AST 23  ALT 11*  ALKPHOS 70  BILITOT 1.0  PROT 6.7  ALBUMIN 3.4*    Recent Labs Lab 03/03/17 2140 03/04/17 0503  LIPASE 146* 111*   CBC:  Recent Labs Lab 03/03/17 2027 03/04/17 0503  WBC 12.8* 10.6  HGB 10.6* 10.0*  HCT 31.1* 30.0*  MCV 80.6 82.6  PLT 409 383   Cardiac Enzymes:  Recent Labs Lab 03/03/17 2140 03/04/17 0503 03/04/17 1043  TROPONINI <0.03 <0.03 <0.03   BNP (last 3 results)  Recent Labs  02/24/17 1952  BNP 9.0     CBG:  Recent Labs Lab 02/25/17 2210 02/26/17 0725 02/26/17 1218 03/04/17 0725 03/04/17 1147  GLUCAP 110* 98 114* 108* 107*    Recent Results (from the past 240 hour(s))  Urine Culture     Status: Abnormal  Collection Time: 02/25/17  8:45 AM  Result Value Ref Range Status   Specimen Description URINE, RANDOM  Final   Special Requests NONE  Final   Culture MULTIPLE SPECIES PRESENT, SUGGEST RECOLLECTION (A)  Final   Report Status 02/26/2017 FINAL  Final     Studies: Ct Abdomen Pelvis Wo Contrast  Result Date: 03/04/2017 CLINICAL DATA:  71 year old diabetic hypertensive female with abdominal pain and nausea found to have gastroparesis/outlet obstruction. Chest pain associated with indigestion. No bowel movement for 3 days. Right breast cancer 2009 post mastectomy. Left breast cancer diagnosed 2016. Subsequent encounter. EXAM: CT ABDOMEN AND PELVIS WITHOUT CONTRAST TECHNIQUE: Multidetector CT imaging  of the abdomen and pelvis was performed following the standard protocol without IV contrast. COMPARISON:  02/24/2017 plain film exam and CT.  12/20/2015 PET-CT. FINDINGS: Lower chest: Elevated left hemidiaphragm (cause by dilated stomach) with basilar atelectasis greater on the left. Coronary artery calcifications. Hepatobiliary: Taking into account limitation by non contrast imaging, no obvious mass. Calcified gallstones. Limited for evaluating for the possibility of inflammation given the small amount of adjacent fluid. Pancreas: Taking into account limitation by non contrast imaging, no obvious mass or primary pancreatic inflammatory process. Spleen: Taking into account limitation by non contrast imaging, no mass or enlargement. Adrenals/Urinary Tract: Minimal nodularity left adrenal gland unchanged. Mild chronic fullness collecting systems greater on right without obstructing stone identified. Slightly lobular contour the kidneys without significant change. Evaluation for detection of a mass is limited by lack of contrast. Partially decompressed urinary bladder with mild thickened wall. Stomach/Bowel: Markedly dilated predominantly fluid-filled stomach and duodenum to the level of the ligament of Treitz. Mild infiltration of surrounding fat planes and prominent surrounding vessels. Small amount of fluid upper and lower abdomen without drainable fluid collection or free intraperitoneal air. Cause of proximal small-bowel obstruction is indeterminate. Underlying mass or adhesion is a possibility. No definitive findings of malrotation. No primary colonic abnormality.  Appendix unremarkable. Vascular/Lymphatic: Prominent calcified plaque aorta and aortic branch vessels without aneurysm noted. Top-normal size upper abdominal lymph nodes stable. Reproductive: Post hysterectomy.  No worrisome adnexal abnormality. Other: No bowel containing hernia. Musculoskeletal: Left L3 lytic lesion involving the vertebral body and  pedicle with pathologic fracture left L3 superior endplate. Findings suggestive of metastatic disease. IMPRESSION: 1. Markedly dilated predominantly fluid-filled stomach and duodenum to the level of the ligament of Treitz (or just beyond). Mild infiltration of surrounding fat planes and prominent surrounding vessels. Cause of proximal small-bowel obstruction is indeterminate. With patient's history of breast cancer and pathologic fracture as discussed below, it is possible findings are related to tumor although adhesions could cause a similar appearance. Patient would benefit from nasogastric tube decompression. 2. Left L3 lytic lesion involving the vertebral body and pedicle with pathologic fracture left L3 superior endplate. Findings suggestive of metastatic disease. 3. Top-normal size upper abdominal lymph nodes unchanged. 4. Mild chronic fullness collecting systems greater on right without obstructing stone identified. Slightly lobular contour the kidneys without significant change. Evaluation for detection of a mass is limited by lack of contrast. 5. Minimal nodular left adrenal gland. 6. Elevated left hemidiaphragm secondary to dilated stomach with left base atelectasis. 7.  Aortic atherosclerosis. 8. Gallstones. Limited for detecting inflammation given the small amount of fluid in the upper abdomen. Electronically Signed   By: Genia Del M.D.   On: 03/04/2017 07:46   Dg Chest 2 View  Result Date: 03/04/2017 CLINICAL DATA:  Post NG TUBE placement EXAM: CHEST - 2 VIEW COMPARISON:  03/03/2017 FINDINGS: Nasogastric tube has been advanced into the gastric lumen. Blunting of the left costophrenic angle suggesting small effusion. Persistent infiltrate atelectasis or scarring at the left lung base. Right lung clear. Heart size normal. Atheromatous aorta. No pneumothorax Anterior vertebral endplate spurring at multiple levels in the mid and lower thoracic spine. IMPRESSION: 1. Nasogastric tube to the stomach. 2.  Small left pleural effusion with atelectasis/infiltrate or scarring at the left lung base. Electronically Signed   By: Lucrezia Europe M.D.   On: 03/04/2017 12:40   Dg Chest 2 View  Result Date: 03/03/2017 CLINICAL DATA:  Chest pain and weakness EXAM: CHEST  2 VIEW COMPARISON:  Chest radiograph and chest CT February 24, 2017 FINDINGS: There is atelectatic change in the lung bases. Lungs elsewhere are clear. Heart is upper normal in size with pulmonary vascularity within normal limits. There is aortic atherosclerosis. There is mild degenerative change in thoracic spine. IMPRESSION: Bibasilar atelectasis. No edema or consolidation. Stable cardiac silhouette. There is aortic atherosclerosis. Aortic Atherosclerosis (ICD10-I70.0). Electronically Signed   By: Lowella Grip III M.D.   On: 03/03/2017 21:05   Dg Abd 1 View  Result Date: 03/04/2017 CLINICAL DATA:  Nasogastric tube placement. EXAM: ABDOMEN - 1 VIEW COMPARISON:  Radiographs February 24, 2017. FINDINGS: The bowel gas pattern is normal. Nasogastric tube tip is seen in proximal stomach. IMPRESSION: Nasogastric tube tip seen in proximal stomach. No evidence of bowel obstruction or ileus. Electronically Signed   By: Marijo Conception, M.D.   On: 03/04/2017 11:42    Scheduled Meds: . enoxaparin (LOVENOX) injection  40 mg Subcutaneous Q24H  . fluticasone  2 spray Each Nare Daily  . insulin aspart  0-9 Units Subcutaneous Q4H  . letrozole  2.5 mg Oral Daily  . loratadine  10 mg Oral Daily  . mometasone-formoterol  2 puff Inhalation BID  . montelukast  10 mg Oral QHS  . palbociclib  125 mg Oral Q lunch  . pantoprazole (PROTONIX) IV  40 mg Intravenous Q24H  . tiotropium  18 mcg Inhalation [id number]   Continuous Infusions: . 0.9 % NaCl with KCl 20 mEq / L 75 mL/hr at 03/04/17 1155  . cefTRIAXone (ROCEPHIN)  IV Stopped (03/04/17 1225)    Assessment/Plan:  1. Small bowel extraction with gastric distention. Case discussed with general surgery to evaluate the  patient. Supportive care with IV fluid hydration and IV pain medication when necessary. NG tube ordered to low suction. 2. Hypokalemia. Change IV fluids to potassium in the IV fluids 3. Metastatic breast cancer. CT scan shows a could be in the lumbar spine. We'll get her oncologist to see the patient. On letrozole and palbociclib 4. GERD on Protonix 5. COPD on Spiriva 6. Hold all nonessential medications 7. Send off ua and uc  Code Status:     Code Status Orders        Start     Ordered   03/04/17 0124  Full code  Continuous     03/04/17 0123    Code Status History    Date Active Date Inactive Code Status Order ID Comments User Context   02/24/2017 11:40 PM 02/26/2017  5:06 PM Full Code [id number]  Lance Coon, MD Inpatient     Family Communication: Spoke with son on the phone Disposition Plan: TBD  Consultants:  General surgery  Oncology  Time spent: 30 minutes  Loletha Grayer  [employer]

## 2017-03-04 NOTE — Progress Notes (Signed)
A&O. Up with assist. IV fluids infusing. Abdominal distention noted. O2 at 3L. Down for abdominal CT.

## 2017-03-05 ENCOUNTER — Inpatient Hospital Stay: Payer: Medicare HMO

## 2017-03-05 ENCOUNTER — Encounter: Payer: Self-pay | Admitting: Radiology

## 2017-03-05 LAB — BASIC METABOLIC PANEL
ANION GAP: 8 (ref 5–15)
BUN: 22 mg/dL — ABNORMAL HIGH (ref 6–20)
CHLORIDE: 100 mmol/L — AB (ref 101–111)
CO2: 28 mmol/L (ref 22–32)
Calcium: 8.5 mg/dL — ABNORMAL LOW (ref 8.9–10.3)
Creatinine, Ser: 0.82 mg/dL (ref 0.44–1.00)
GFR calc non Af Amer: >60 mL/min (ref >60)
Glucose, Bld: 72 mg/dL (ref 65–99)
POTASSIUM: 3.5 mmol/L (ref 3.5–5.1)
SODIUM: 136 mmol/L (ref 135–145)

## 2017-03-05 LAB — GLUCOSE, CAPILLARY
GLUCOSE-CAPILLARY: 63 mg/dL — AB (ref 65–99)
GLUCOSE-CAPILLARY: 68 mg/dL (ref 65–99)
GLUCOSE-CAPILLARY: 94 mg/dL (ref 65–99)
GLUCOSE-CAPILLARY: 99 mg/dL (ref 65–99)
Glucose-Capillary: 63 mg/dL — ABNORMAL LOW (ref 65–99)
Glucose-Capillary: 82 mg/dL (ref 65–99)

## 2017-03-05 LAB — URINALYSIS, COMPLETE (UACMP) WITH MICROSCOPIC
Bacteria, UA: NONE SEEN
Bilirubin Urine: NEGATIVE
Glucose, UA: 150 mg/dL — AB
HGB URINE DIPSTICK: NEGATIVE
Ketones, ur: 5 mg/dL — AB
NITRITE: NEGATIVE
Protein, ur: NEGATIVE mg/dL
SPECIFIC GRAVITY, URINE: 1.011 (ref 1.005–1.030)
pH: 5 (ref 5.0–8.0)

## 2017-03-05 LAB — ECHOCARDIOGRAM COMPLETE
Height: 62 in
Weight: 2435.2 oz

## 2017-03-05 LAB — LIPASE, BLOOD: LIPASE: 35 U/L (ref 11–51)

## 2017-03-05 LAB — MAGNESIUM: MAGNESIUM: 2.3 mg/dL (ref 1.7–2.4)

## 2017-03-05 MED ORDER — KCL IN DEXTROSE-NACL 20-5-0.9 MEQ/L-%-% IV SOLN
INTRAVENOUS | Status: DC
  Administered 2017-03-05 – 2017-03-06 (×2): via INTRAVENOUS
  Filled 2017-03-05 (×4): qty 1000

## 2017-03-05 MED ORDER — IOPAMIDOL (ISOVUE-300) INJECTION 61%
100.0000 mL | Freq: Once | INTRAVENOUS | Status: AC | PRN
  Administered 2017-03-05: 100 mL via INTRAVENOUS

## 2017-03-05 MED ORDER — DEXTROSE 50 % IV SOLN
1.0000 | Freq: Once | INTRAVENOUS | Status: AC
  Administered 2017-03-05: 50 mL via INTRAVENOUS
  Filled 2017-03-05: qty 50

## 2017-03-05 NOTE — Evaluation (Signed)
Physical Therapy Evaluation Patient Details Name: Sophia Nelson MRN: [id number] DOB: June 02, 1946 Today's Date: 03/05/2017   History of Present Illness  Sophia Nelson is a 71 y.o. female with a past medical history of arthritis, COPD, gastric reflux, diabetes, hypertension, presents to the emergency department with chest pain. Patient was admitted to the hospital from 02/24/17 - 02/26/17 for abdominal pain / nausea found to have gastroparesis/ outlet obstruction.  Abdominal pain and nausea have been well controlled however; she sought medical attention today when she developed central chest pain associated with indigestion. Pain is central/midepigastric and burning in nature.   No diaphoresis, palpitations, SOB.  Also complains of 3 days of no bowel movement and minimal flatus. She is now admitted for SBO with gastric obstruction and is currently being managed with NG tube.   Clinical Impression  Pt admitted with above diagnosis. Pt currently with functional limitations due to the deficits listed below (see PT Problem List).  Pt moves slowly and with notable bilateral LE weakness. She is able to ambulate to RN station and back to bed. HR increases to around 98 bpm with ambulation. SaO2>95% on 3 L/min supplemental O2 via [place] which is her baseline flow rate. Pt requiring cues for walker management and turns. Pt is currently declined from her baseline but safe to return home with [facility] PT and support from her family. Pt will benefit from PT services to address deficits in strength, balance, and mobility in order to return to full function at home.     Follow Up Recommendations Home health PT    Equipment Recommendations  Rolling walker with 5" wheels    Recommendations for Other Services       Precautions / Restrictions Precautions Precautions: Fall Restrictions Weight Bearing Restrictions: No      Mobility  Bed Mobility Overal bed mobility: Independent             General bed mobility  comments: Increased time to perform, HOB minimally elevated and use of bed rails  Transfers Overall transfer level: Needs assistance Equipment used: Rolling walker (2 wheeled) Transfers: Sit to/from Stand Sit to Stand: Min guard         General transfer comment: Pt requires increased time to come to standing. Steady once upright with UE support on rolling walker.  Ambulation/Gait Ambulation/Gait assistance: Min guard Ambulation Distance (Feet): 80 Feet Assistive device: Rolling walker (2 wheeled) Gait Pattern/deviations: Decreased step length - right;Decreased step length - left Gait velocity: Decreas3ed Gait velocity interpretation: <1.8 ft/sec, indicative of risk for recurrent falls General Gait Details: Pt able to ambulate to RN station and back to bed. HR increases to around 98 bpm with ambulation. SaO2>95% on 3 L/min supplemental O2 via [place] which is her baseline flow rate. Pt requiring cues for walker management and turns. She present with bilateral LE weakness and fatigue. Pt is currently declined from her baseline  Financial trader Rankin (Stroke Patients Only)       Balance Overall balance assessment: Needs assistance Sitting-balance support: No upper extremity supported Sitting balance-Leahy Scale: Good     Standing balance support: Bilateral upper extremity supported Standing balance-Leahy Scale: Fair Standing balance comment: Able to maintain static standing balance without UE support                             Pertinent Vitals/Pain Pain  Assessment: 0-10 Pain Score: 4  Pain Location: "stomach soreness" Pain Descriptors / Indicators: Sore Pain Intervention(s): Monitored during session    Home Living Family/patient expects to be discharged to:: Private residence Living Arrangements: Children Available Help at Discharge: Family;Available 24 hours/day Type of Home: Apartment Home Access: Stairs to  enter Entrance Stairs-Rails: Right Entrance Stairs-Number of Steps: 5 Home Layout: One level Home Equipment: Cane - single point;Shower seat (no walker, no grab bars)      Prior Function Level of Independence: Needs assistance   Gait / Transfers Assistance Needed: Ambulates with single point cane. No falls in the last 12 months  ADL's / Homemaking Assistance Needed: Assist from family with ADLs/IADLs        Hand Dominance   Dominant Hand: Right    Extremity/Trunk Assessment   Upper Extremity Assessment Upper Extremity Assessment: Overall WFL for tasks assessed    Lower Extremity Assessment Lower Extremity Assessment: Generalized weakness       Communication   Communication: No difficulties  Cognition Arousal/Alertness: Awake/alert Behavior During Therapy: WFL for tasks assessed/performed Overall Cognitive Status: Within Functional Limits for tasks assessed                                        General Comments      Exercises     Assessment/Plan    PT Assessment Patient needs continued PT services  PT Problem List Decreased strength;Decreased activity tolerance;Decreased balance       PT Treatment Interventions DME instruction;Gait training;Therapeutic exercise;Therapeutic activities;Functional mobility training;Balance training;Neuromuscular re-education;Patient/family education    PT Goals (Current goals can be found in the Care Plan section)  Acute Rehab PT Goals Patient Stated Goal: "I really want to go home" PT Goal Formulation: With patient Time For Goal Achievement: 03/19/17 Potential to Achieve Goals: Good    Frequency Min 2X/week   Barriers to discharge        Co-evaluation               AM-PAC PT "6 Clicks" Daily Activity  Outcome Measure Difficulty turning over in bed (including adjusting bedclothes, sheets and blankets)?: A Little Difficulty moving from lying on back to sitting on the side of the bed? : A  Little Difficulty sitting down on and standing up from a chair with arms (e.g., wheelchair, bedside commode, etc,.)?: A Little Help needed moving to and from a bed to chair (including a wheelchair)?: A Little Help needed walking in hospital room?: A Little Help needed climbing 3-5 steps with a railing? : A Little 6 Click Score: 18    End of Session Equipment Utilized During Treatment: Gait belt;Oxygen Activity Tolerance: Patient tolerated treatment well Patient left: in chair;with call bell/phone within reach Nurse Communication: Other (comment) (Ready to reconnect wall suction) PT Visit Diagnosis: Unsteadiness on feet (R26.81);Muscle weakness (generalized) (M62.81)    Time: 8182-9937 PT Time Calculation (min) (ACUTE ONLY): 24 min   Charges:   PT Evaluation $PT Eval Moderate Complexity: 1 Procedure PT Treatments $Gait Training: 8-22 mins   PT G Codes:   PT G-Codes **NOT FOR INPATIENT CLASS** Functional Assessment Tool Used: AM-PAC 6 Clicks Basic Mobility Functional Limitation: Mobility: Walking and moving around Mobility: Walking and Moving Around Current Status (J6967): At least 40 percent but less than 60 percent impaired, limited or restricted Mobility: Walking and Moving Around Goal Status [id number]): At least 20 percent but less than  40 percent impaired, limited or restricted    Phillips Grout PT, DPT    Sophia Nelson 03/05/2017, 10:51 AM

## 2017-03-05 NOTE — Progress Notes (Signed)
Patient ID: Sophia Nelson, female   DOB: 05/26/1946, 71 y.o.   MRN: [id number]    [facility] PROGRESS NOTE  Sophia Nelson QBH:[id number] DOB: 1946-06-14 DOA: 03/03/2017 PCP: Donnie Coffin, MD  HPI/Subjective: Patient seen this afternoon and she stated she had 2 bowel movements yesterday evening and one this morning. No further nausea vomiting with the NG tube place. Still having abdominal soreness graded a 3 out of 10 in intensity  Objective: Vitals:   03/05/17 0834 03/05/17 1209  BP: 124/60 (!) 101/51  Pulse: 80 76  Resp:  10  Temp: 98.3 F (36.8 C) 98.1 F (36.7 C)    Filed Weights   03/04/17 0100  Weight: 69 kg (152 lb 3.2 oz)    ROS: Review of Systems  Constitutional: Negative for chills and fever.  Eyes: Negative for blurred vision.  Respiratory: Negative for cough and shortness of breath.   Cardiovascular: Negative for chest pain.  Gastrointestinal: Positive for abdominal pain. Negative for constipation, diarrhea, nausea and vomiting.  Genitourinary: Negative for dysuria.  Musculoskeletal: Negative for joint pain.  Neurological: Negative for dizziness and headaches.   Exam: Physical Exam  Constitutional: She is oriented to person, place, and time.  HENT:  Nose: No mucosal edema.  Mouth/Throat: No oropharyngeal exudate or posterior oropharyngeal edema.  Eyes: Conjunctivae, EOM and lids are normal. Pupils are equal, round, and reactive to light.  Neck: No JVD present. Carotid bruit is not present. No edema present. No thyroid mass and no thyromegaly present.  Cardiovascular: S1 normal and S2 normal.  Exam reveals no gallop.   No murmur heard. Pulses:      Dorsalis pedis pulses are 2+ on the right side, and 2+ on the left side.  Respiratory: No respiratory distress. She has decreased breath sounds in the left lower field. She has no wheezes. She has no rhonchi. She has no rales.  GI: Soft. She exhibits distension. Bowel sounds are decreased. There is  tenderness.  Musculoskeletal:       Right ankle: She exhibits swelling.       Left ankle: She exhibits swelling.  Lymphadenopathy:    She has no cervical adenopathy.  Neurological: She is alert and oriented to person, place, and time. No cranial nerve deficit.  Skin: Skin is warm. No rash noted. Nails show no clubbing.  Psychiatric: She has a normal mood and affect.      Data Reviewed: Basic Metabolic Panel:  Recent Labs Lab 03/03/17 2140 03/04/17 0503 03/05/17 0520  NA 137 137 136  K 4.0 3.2* 3.5  CL 91* 93* 100*  CO2 32 35* 28  GLUCOSE 133* 127* 72  BUN 45* 41* 22*  CREATININE 1.49* 1.19* 0.82  CALCIUM 9.0 8.7* 8.5*  MG 2.5*  --  2.3  PHOS 3.6  --   --    Liver Function Tests:  Recent Labs Lab 03/03/17 2140  AST 23  ALT 11*  ALKPHOS 70  BILITOT 1.0  PROT 6.7  ALBUMIN 3.4*    Recent Labs Lab 03/03/17 2140 03/04/17 0503 03/05/17 0520  LIPASE 146* 111* 35   CBC:  Recent Labs Lab 03/03/17 2027 03/04/17 0503  WBC 12.8* 10.6  HGB 10.6* 10.0*  HCT 31.1* 30.0*  MCV 80.6 82.6  PLT 409 383   Cardiac Enzymes:  Recent Labs Lab 03/03/17 2140 03/04/17 0503 03/04/17 1043 03/04/17 1826  TROPONINI <0.03 <0.03 <0.03 <0.03   BNP (last 3 results)  Recent Labs  02/24/17 1952  BNP  9.0     CBG:  Recent Labs Lab 03/04/17 2033 03/05/17 0034 03/05/17 0502 03/05/17 0741 03/05/17 1209  GLUCAP 63* 63* 68 99 63*    Recent Results (from the past 240 hour(s))  Urine Culture     Status: Abnormal   Collection Time: 02/25/17  8:45 AM  Result Value Ref Range Status   Specimen Description URINE, RANDOM  Final   Special Requests NONE  Final   Culture MULTIPLE SPECIES PRESENT, SUGGEST RECOLLECTION (A)  Final   Report Status 02/26/2017 FINAL  Final     Studies: Ct Abdomen Pelvis Wo Contrast  Result Date: 03/04/2017 CLINICAL DATA:  71 year old diabetic hypertensive female with abdominal pain and nausea found to have gastroparesis/outlet obstruction.  Chest pain associated with indigestion. No bowel movement for 3 days. Right breast cancer 2009 post mastectomy. Left breast cancer diagnosed 2016. Subsequent encounter. EXAM: CT ABDOMEN AND PELVIS WITHOUT CONTRAST TECHNIQUE: Multidetector CT imaging of the abdomen and pelvis was performed following the standard protocol without IV contrast. COMPARISON:  02/24/2017 plain film exam and CT.  12/20/2015 PET-CT. FINDINGS: Lower chest: Elevated left hemidiaphragm (cause by dilated stomach) with basilar atelectasis greater on the left. Coronary artery calcifications. Hepatobiliary: Taking into account limitation by non contrast imaging, no obvious mass. Calcified gallstones. Limited for evaluating for the possibility of inflammation given the small amount of adjacent fluid. Pancreas: Taking into account limitation by non contrast imaging, no obvious mass or primary pancreatic inflammatory process. Spleen: Taking into account limitation by non contrast imaging, no mass or enlargement. Adrenals/Urinary Tract: Minimal nodularity left adrenal gland unchanged. Mild chronic fullness collecting systems greater on right without obstructing stone identified. Slightly lobular contour the kidneys without significant change. Evaluation for detection of a mass is limited by lack of contrast. Partially decompressed urinary bladder with mild thickened wall. Stomach/Bowel: Markedly dilated predominantly fluid-filled stomach and duodenum to the level of the ligament of Treitz. Mild infiltration of surrounding fat planes and prominent surrounding vessels. Small amount of fluid upper and lower abdomen without drainable fluid collection or free intraperitoneal air. Cause of proximal small-bowel obstruction is indeterminate. Underlying mass or adhesion is a possibility. No definitive findings of malrotation. No primary colonic abnormality.  Appendix unremarkable. Vascular/Lymphatic: Prominent calcified plaque aorta and aortic branch vessels  without aneurysm noted. Top-normal size upper abdominal lymph nodes stable. Reproductive: Post hysterectomy.  No worrisome adnexal abnormality. Other: No bowel containing hernia. Musculoskeletal: Left L3 lytic lesion involving the vertebral body and pedicle with pathologic fracture left L3 superior endplate. Findings suggestive of metastatic disease. IMPRESSION: 1. Markedly dilated predominantly fluid-filled stomach and duodenum to the level of the ligament of Treitz (or just beyond). Mild infiltration of surrounding fat planes and prominent surrounding vessels. Cause of proximal small-bowel obstruction is indeterminate. With patient's history of breast cancer and pathologic fracture as discussed below, it is possible findings are related to tumor although adhesions could cause a similar appearance. Patient would benefit from nasogastric tube decompression. 2. Left L3 lytic lesion involving the vertebral body and pedicle with pathologic fracture left L3 superior endplate. Findings suggestive of metastatic disease. 3. Top-normal size upper abdominal lymph nodes unchanged. 4. Mild chronic fullness collecting systems greater on right without obstructing stone identified. Slightly lobular contour the kidneys without significant change. Evaluation for detection of a mass is limited by lack of contrast. 5. Minimal nodular left adrenal gland. 6. Elevated left hemidiaphragm secondary to dilated stomach with left base atelectasis. 7.  Aortic atherosclerosis. 8. Gallstones. Limited for detecting inflammation  given the small amount of fluid in the upper abdomen. Electronically Signed   By: Genia Del M.D.   On: 03/04/2017 07:46   Dg Chest 2 View  Result Date: 03/04/2017 CLINICAL DATA:  Post NG TUBE placement EXAM: CHEST - 2 VIEW COMPARISON:  03/03/2017 FINDINGS: Nasogastric tube has been advanced into the gastric lumen. Blunting of the left costophrenic angle suggesting small effusion. Persistent infiltrate atelectasis  or scarring at the left lung base. Right lung clear. Heart size normal. Atheromatous aorta. No pneumothorax Anterior vertebral endplate spurring at multiple levels in the mid and lower thoracic spine. IMPRESSION: 1. Nasogastric tube to the stomach. 2. Small left pleural effusion with atelectasis/infiltrate or scarring at the left lung base. Electronically Signed   By: Lucrezia Europe M.D.   On: 03/04/2017 12:40   Dg Chest 2 View  Result Date: 03/03/2017 CLINICAL DATA:  Chest pain and weakness EXAM: CHEST  2 VIEW COMPARISON:  Chest radiograph and chest CT February 24, 2017 FINDINGS: There is atelectatic change in the lung bases. Lungs elsewhere are clear. Heart is upper normal in size with pulmonary vascularity within normal limits. There is aortic atherosclerosis. There is mild degenerative change in thoracic spine. IMPRESSION: Bibasilar atelectasis. No edema or consolidation. Stable cardiac silhouette. There is aortic atherosclerosis. Aortic Atherosclerosis (ICD10-I70.0). Electronically Signed   By: Lowella Grip III M.D.   On: 03/03/2017 21:05   Dg Abd 1 View  Result Date: 03/04/2017 CLINICAL DATA:  Nasogastric tube placement. EXAM: ABDOMEN - 1 VIEW COMPARISON:  Radiographs February 24, 2017. FINDINGS: The bowel gas pattern is normal. Nasogastric tube tip is seen in proximal stomach. IMPRESSION: Nasogastric tube tip seen in proximal stomach. No evidence of bowel obstruction or ileus. Electronically Signed   By: Marijo Conception, M.D.   On: 03/04/2017 11:42    Scheduled Meds: . dextrose  25 mL Intravenous Once  . enoxaparin (LOVENOX) injection  40 mg Subcutaneous Q24H  . fluticasone  2 spray Each Nare Daily  . letrozole  2.5 mg Oral Daily  . loratadine  10 mg Oral Daily  . mometasone-formoterol  2 puff Inhalation BID  . montelukast  10 mg Oral QHS  . palbociclib  125 mg Oral Q lunch  . pantoprazole (PROTONIX) IV  40 mg Intravenous Q24H  . tiotropium  18 mcg Inhalation BH-q7a   Continuous Infusions: .  cefTRIAXone (ROCEPHIN)  IV Stopped (03/05/17 1106)  . dextrose 5 % and 0.9 % NaCl with KCl 20 mEq/L 75 mL/hr at 03/05/17 1315    Assessment/Plan:  1. Small bowel Obstruction with gastric distention. NG tube ordered to low suction. Surgical team following. The patient has had 3 bowel movements which is a good thing. 2. Relative hypoglycemia. Add D5 to IV fluids. Glipizide on hold. Check fingersticks every 4 hours. 3. Hypokalemia. Continue potassium in the IV fluids 4. Metastatic breast cancer. On letrozole and palbociclib. Appreciate oncology consultation 5. GERD on Protonix 6. COPD on Spiriva 7. Hold all nonessential medications 8. Send off ua and uc 9. Lipase normalized with IV fluids.  Code Status:     Code Status Orders        Start     Ordered   03/04/17 0124  Full code  Continuous     03/04/17 0123    Code Status History    Date Active Date Inactive Code Status Order ID Comments User Context   02/24/2017 11:40 PM 02/26/2017  5:06 PM Full Code [id number]  Lance Coon, MD Inpatient  Family Communication: Spoke with son on the phone Yesterday Disposition Plan: TBD  Consultants:  General surgery  Oncology  Gastroenterology  Time spent: 25 minutes  Loletha Grayer  [employer]

## 2017-03-05 NOTE — Progress Notes (Signed)
Patient CBG remaining in 60s throughout night. Asymptomatic. Md notified. Ordered for D50 given. Will monitor

## 2017-03-05 NOTE — Progress Notes (Signed)
SURGICAL PROGRESS NOTE (cpt [id number])  Hospital Day(s): 1.   Post op day(s):  Sophia Nelson   Interval History: Patient seen and examined, no acute events or new complaints overnight. Patient reports her abdominal pain and nausea have both essentially resolved with NG decompression, and she otherwise reports +flatus, denies belching, fever/chills, CP, or SOB.  Review of Systems:  Constitutional: denies fever, chills  HEENT: denies cough or congestion  Respiratory: denies any shortness of breath  Cardiovascular: denies chest pain or palpitations  Gastrointestinal: abdominal pain, N/V, and bowel function as per interval history Genitourinary: denies burning with urination or urinary frequency Musculoskeletal: denies pain, decreased motor or sensation Integumentary: denies any other rashes or skin discolorations Neurological: denies HA or vision/hearing changes   Vital signs in last 24 hours: [min-max] current  Temp:  [98 F (36.7 C)-98.5 F (36.9 C)] 98 F (36.7 C) (07/10 0411) Pulse Rate:  [73] 73 (07/10 0411) Resp:  [16-18] 18 (07/10 0411) BP: (103-116)/(48-65) 103/48 (07/10 0411) SpO2:  [98 %-100 %] 98 % (07/10 0411)     Height: 5\' 2"  (157.5 cm) Weight: 152 lb 3.2 oz (69 kg) BMI (Calculated): 27.9   Intake/Output this shift:  No intake/output data recorded.   Intake/Output last 2 shifts:  @IOLAST2SHIFTS @   Physical Exam:  Constitutional: alert, cooperative and no distress  HENT: normocephalic without obvious abnormality  Eyes: PERRL, EOM's grossly intact and symmetric  Neuro: CN II - XII grossly intact and symmetric without deficit  Respiratory: breathing non-labored at rest  Cardiovascular: regular rate and sinus rhythm  Gastrointestinal: soft, minimal upper abdominal tenderness to palpation, and non-distended, NG tube secured with <100 mL of bilious drainage in canister  Musculoskeletal: UE and LE FROM, no edema or wounds, motor and sensation grossly intact, NT   Labs:  CBC  Latest Ref Rng & Units 03/04/2017 03/03/2017 02/25/2017  WBC 3.6 - 11.0 K/uL 10.6 12.8(H) 15.3(H)  Hemoglobin 12.0 - 16.0 g/dL 10.0(L) 10.6(L) 9.7(L)  Hematocrit 35.0 - 47.0 % 30.0(L) 31.1(L) 29.2(L)  Platelets 150 - 440 K/uL 383 409 468(H)   CMP Latest Ref Rng & Units 03/05/2017 03/04/2017 03/03/2017  Glucose 65 - 99 mg/dL 72 127(H) 133(H)  BUN 6 - 20 mg/dL 22(H) 41(H) 45(H)  Creatinine 0.44 - 1.00 mg/dL 0.82 1.19(H) 1.49(H)  Sodium 135 - 145 mmol/L 136 137 137  Potassium 3.5 - 5.1 mmol/L 3.5 3.2(L) 4.0  Chloride 101 - 111 mmol/L 100(L) 93(L) 91(L)  CO2 22 - 32 mmol/L 28 35(H) 32  Calcium 8.9 - 10.3 mg/dL 8.5(L) 8.7(L) 9.0  Total Protein 6.5 - 8.1 g/dL - - 6.7  Total Bilirubin 0.3 - 1.2 mg/dL - - 1.0  Alkaline Phos 38 - 126 U/L - - 70  AST 15 - 41 U/L - - 23  ALT 14 - 54 U/L - - 11(L)   Imaging studies: No new pertinent imaging studies   Assessment/Plan: (ICD-10's: K31.5, C79.9) 71 y.o. 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              - nasogastric decompression             - IVF for now and correct electrolytes             - PICC + TPN for nutrition considering prolonged NPO  - importance  of nutrition discussed at length with patient, who initially refused PICC for TPN             - diagnostic imaging discussed with radiologist, who agrees with CT enterography, possible subsequent PET             - G-J bypass vs G-J tube vs chemoradiation pending diagnosis             - medical optimization and management of comorbidities              - DVT prophylaxis  All of the above findings and recommendations were discussed with the patient, patient's family, and patient's RN, and all of patient's and family's questions were answered to their expressed  satisfaction.  Thank you for the opportunity to participate in this patient's care.  -- [name] Rosana Hoes, MD, [place]: [facility] - Partnering for exceptional care. Office: [phone]

## 2017-03-06 ENCOUNTER — Encounter: Payer: Self-pay | Admitting: *Deleted

## 2017-03-06 ENCOUNTER — Inpatient Hospital Stay: Payer: Medicare HMO | Admitting: Anesthesiology

## 2017-03-06 ENCOUNTER — Inpatient Hospital Stay: Payer: Medicare HMO

## 2017-03-06 ENCOUNTER — Other Ambulatory Visit: Payer: Self-pay | Admitting: Internal Medicine

## 2017-03-06 ENCOUNTER — Encounter
Admission: EM | Disposition: A | Discharge status: Home Health | Payer: Self-pay | Source: Home / Self Care | Attending: Internal Medicine

## 2017-03-06 ENCOUNTER — Telehealth: Payer: Self-pay | Admitting: Internal Medicine

## 2017-03-06 DIAGNOSIS — K315 Obstruction of duodenum: Secondary | ICD-10-CM

## 2017-03-06 HISTORY — PX: ESOPHAGOGASTRODUODENOSCOPY (EGD) WITH PROPOFOL: SHX5813

## 2017-03-06 LAB — BASIC METABOLIC PANEL
Anion gap: 6 (ref 5–15)
BUN: 10 mg/dL (ref 6–20)
CALCIUM: 8.6 mg/dL — AB (ref 8.9–10.3)
CHLORIDE: 104 mmol/L (ref 101–111)
CO2: 29 mmol/L (ref 22–32)
CREATININE: 0.82 mg/dL (ref 0.44–1.00)
GFR calc non Af Amer: >60 mL/min (ref >60)
GLUCOSE: 95 mg/dL (ref 65–99)
Potassium: 3.2 mmol/L — ABNORMAL LOW (ref 3.5–5.1)
Sodium: 139 mmol/L (ref 135–145)

## 2017-03-06 LAB — HEPATIC FUNCTION PANEL
ALBUMIN: 3.1 g/dL — AB (ref 3.5–5.0)
ALK PHOS: 67 U/L (ref 38–126)
ALT: 12 U/L — ABNORMAL LOW (ref 14–54)
AST: 21 U/L (ref 15–41)
BILIRUBIN TOTAL: 0.7 mg/dL (ref 0.3–1.2)
Bilirubin, Direct: <0.1 mg/dL — ABNORMAL LOW (ref 0.1–0.5)
Total Protein: 6.6 g/dL (ref 6.5–8.1)

## 2017-03-06 LAB — GLUCOSE, CAPILLARY
GLUCOSE-CAPILLARY: 104 mg/dL — AB (ref 65–99)
GLUCOSE-CAPILLARY: 105 mg/dL — AB (ref 65–99)
GLUCOSE-CAPILLARY: 121 mg/dL — AB (ref 65–99)
GLUCOSE-CAPILLARY: 185 mg/dL — AB (ref 65–99)
Glucose-Capillary: 100 mg/dL — ABNORMAL HIGH (ref 65–99)
Glucose-Capillary: 105 mg/dL — ABNORMAL HIGH (ref 65–99)
Glucose-Capillary: 92 mg/dL (ref 65–99)

## 2017-03-06 LAB — CBC
HEMATOCRIT: 28.6 % — AB (ref 35.0–47.0)
HEMOGLOBIN: 9.2 g/dL — AB (ref 12.0–16.0)
MCH: 26.8 pg (ref 26.0–34.0)
MCHC: 32.3 g/dL (ref 32.0–36.0)
MCV: 83.1 fL (ref 80.0–100.0)
Platelets: 379 10*3/uL (ref 150–440)
RBC: 3.44 MIL/uL — ABNORMAL LOW (ref 3.80–5.20)
RDW: 17.7 % — ABNORMAL HIGH (ref 11.5–14.5)
WBC: 11.7 10*3/uL — ABNORMAL HIGH (ref 3.6–11.0)

## 2017-03-06 LAB — PHOSPHORUS: Phosphorus: 2.6 mg/dL (ref 2.5–4.6)

## 2017-03-06 LAB — URINE CULTURE

## 2017-03-06 SURGERY — ESOPHAGOGASTRODUODENOSCOPY (EGD) WITH PROPOFOL
Anesthesia: General

## 2017-03-06 MED ORDER — SUCCINYLCHOLINE CHLORIDE 20 MG/ML IJ SOLN
INTRAMUSCULAR | Status: AC
  Filled 2017-03-06: qty 1

## 2017-03-06 MED ORDER — FENTANYL CITRATE (PF) 100 MCG/2ML IJ SOLN
INTRAMUSCULAR | Status: AC
  Filled 2017-03-06: qty 2

## 2017-03-06 MED ORDER — DEXAMETHASONE SODIUM PHOSPHATE 10 MG/ML IJ SOLN
INTRAMUSCULAR | Status: DC | PRN
  Administered 2017-03-06: 5 mg via INTRAVENOUS

## 2017-03-06 MED ORDER — FENTANYL CITRATE (PF) 100 MCG/2ML IJ SOLN
INTRAMUSCULAR | Status: DC | PRN
  Administered 2017-03-06 (×2): 50 ug via INTRAVENOUS

## 2017-03-06 MED ORDER — SUCCINYLCHOLINE CHLORIDE 20 MG/ML IJ SOLN
INTRAMUSCULAR | Status: DC | PRN
  Administered 2017-03-06: 100 mg via INTRAVENOUS

## 2017-03-06 MED ORDER — TRACE MINERALS CR-CU-MN-SE-ZN 10-1000-500-60 MCG/ML IV SOLN
INTRAVENOUS | Status: AC
  Administered 2017-03-06: 19:00:00 via INTRAVENOUS
  Filled 2017-03-06: qty 960

## 2017-03-06 MED ORDER — ONDANSETRON HCL 4 MG/2ML IJ SOLN
INTRAMUSCULAR | Status: DC | PRN
  Administered 2017-03-06: 4 mg via INTRAVENOUS

## 2017-03-06 MED ORDER — PROPOFOL 10 MG/ML IV BOLUS
INTRAVENOUS | Status: DC | PRN
  Administered 2017-03-06: 50 mg via INTRAVENOUS
  Administered 2017-03-06: 110 mg via INTRAVENOUS
  Administered 2017-03-06: 40 mg via INTRAVENOUS

## 2017-03-06 MED ORDER — PROPOFOL 10 MG/ML IV BOLUS
INTRAVENOUS | Status: AC
  Filled 2017-03-06: qty 20

## 2017-03-06 MED ORDER — DEXAMETHASONE SODIUM PHOSPHATE 10 MG/ML IJ SOLN
INTRAMUSCULAR | Status: AC
  Filled 2017-03-06: qty 1

## 2017-03-06 MED ORDER — POTASSIUM CHLORIDE 10 MEQ/100ML IV SOLN
10.0000 meq | INTRAVENOUS | Status: AC
  Administered 2017-03-06 (×3): 10 meq via INTRAVENOUS
  Filled 2017-03-06 (×3): qty 100

## 2017-03-06 MED ORDER — MORPHINE SULFATE (PF) 2 MG/ML IV SOLN
1.0000 mg | INTRAVENOUS | Status: DC | PRN
  Administered 2017-03-06 – 2017-03-07 (×4): 1 mg via INTRAVENOUS
  Filled 2017-03-06 (×4): qty 1

## 2017-03-06 MED ORDER — INSULIN ASPART 100 UNIT/ML ~~LOC~~ SOLN
0.0000 [IU] | Freq: Four times a day (QID) | SUBCUTANEOUS | Status: DC
  Administered 2017-03-07: 2 [IU] via SUBCUTANEOUS
  Administered 2017-03-07: 1 [IU] via SUBCUTANEOUS
  Administered 2017-03-08: 02:00:00 2 [IU] via SUBCUTANEOUS
  Filled 2017-03-06 (×3): qty 1

## 2017-03-06 MED ORDER — MEPERIDINE HCL 25 MG/ML IJ SOLN: 6.2500 mg | INTRAMUSCULAR | Status: DC | PRN

## 2017-03-06 MED ORDER — POTASSIUM CHLORIDE 10 MEQ/100ML IV SOLN
10.0000 meq | INTRAVENOUS | Status: DC
  Filled 2017-03-06 (×3): qty 100

## 2017-03-06 MED ORDER — SODIUM CHLORIDE 0.9 % IV SOLN
INTRAVENOUS | Status: DC | PRN
  Administered 2017-03-06: 14:00:00 via INTRAVENOUS

## 2017-03-06 MED ORDER — ONDANSETRON HCL 4 MG/2ML IJ SOLN
INTRAMUSCULAR | Status: AC
  Filled 2017-03-06: qty 2

## 2017-03-06 MED ORDER — PROMETHAZINE HCL 25 MG/ML IJ SOLN: 6.2500 mg | INTRAMUSCULAR | Status: DC | PRN

## 2017-03-06 MED ORDER — FENTANYL CITRATE (PF) 100 MCG/2ML IJ SOLN
25.0000 ug | INTRAMUSCULAR | Status: DC | PRN
  Administered 2017-03-06 (×2): 50 ug via INTRAVENOUS

## 2017-03-06 NOTE — Anesthesia Postprocedure Evaluation (Signed)
Anesthesia Post Note  Patient: Sophia Nelson  Procedure(s) Performed: Procedure(s) (LRB): ESOPHAGOGASTRODUODENOSCOPY (EGD) WITH PROPOFOL (N/A)  Patient location during evaluation: PACU Anesthesia Type: General Level of consciousness: awake and alert and oriented Pain management: pain level controlled Vital Signs Assessment: post-procedure vital signs reviewed and stable Respiratory status: spontaneous breathing, nonlabored ventilation and respiratory function stable Cardiovascular status: blood pressure returned to baseline and stable Postop Assessment: no signs of nausea or vomiting Anesthetic complications: no     Last Vitals:  Vitals:   03/06/17 1504 03/06/17 1533  BP: 117/63 (!) 143/74  Pulse: 79 70  Resp: 11   Temp: 36.8 C     Last Pain:  Vitals:   03/06/17 1504  TempSrc:   PainSc: 7                  [place]ville Rico

## 2017-03-06 NOTE — Progress Notes (Signed)
Initial Nutrition Assessment  DOCUMENTATION CODES:   Severe malnutrition in context of acute illness/injury  INTERVENTION:  Once central line placement confirmed, recommend initiating Clinimix E 5/15 at 40 ml/hr. After 24 hrs if electrolytes, CBGs, and triglycerides are in acceptable range can advance to goal regimen of Clinimix E 5/15 at 80 ml/hr + 20% ILE at 20 ml/hr over 12 hours. Goal regimen provides 1843 kcal, 96 grams of protein, 2160 ml fluid daily.  Monitor magnesium, potassium, and phosphorus daily for at least 3 days, MD to replete as needed, as pt is at risk for refeeding syndrome given severe acute malnutrition, patient's report of no intake for >1 week.  NUTRITION DIAGNOSIS:   Malnutrition (Severe) related to acute illness (small bowel obstruction) as evidenced by energy intake < or equal to 50% for > or equal to 5 days,8.7  percent weight loss over 3 months.  GOAL:   Patient will meet greater than or equal to 90% of their needs  MONITOR:   Diet advancement, Labs, Weight trends, I & O's  REASON FOR ASSESSMENT:   Consult New TPN/TNA  ASSESSMENT:   71 year old female with PMHx of HTN, DM type 2, GERD, COPD, hx right breast cancer s/p mastectomy in 2009, hx of left breast cancer s/p mastectomy in 2017, recent admission 7/1-7/3 with abdominal pain, N/V for gastric outlet obstruction vs gastroparesis. 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 -Per surgical note plan is for G-J bypass vs G-J tube vs chemoradiation pending diagnosis.  Spoke with patient at bedside. She was not able to give a good history due to recent sedation. She reports she has had abdominal pain, nausea, vomiting for 3 weeks to one month. She reports she has not eaten a meal  in over one week. Patient reports she is not feeling well and cannot provide any more details at this time. She endorses weight loss but is not sure how much she has lost. Per chart she was 166.6 lbs on 11/12/2016 and lost 14.5 lbs (8.7% body weight) over 3 months, which is significant for time frame.  Access: right internal jugular non-tunneled triple lumen CVC placed today  Medications reviewed and include: letrozole, palbociclib, ceftriaxone, D5-NS with KCl 20 mEq/L @ 75 ml/hr (1.8 L, 90 grams dextrose, 306 kcal daily), potassium chloride 10 mEq 3 times today IV. Patient received fentanyl 50 microgram injection at 1445 and 1458.  Labs reviewed: CBG 82-105 past 24 hrs, Potassium 3.2.   Nutrition-Focused physical exam completed. Findings are no fat depletion, no muscle depletion, and no edema.   NGT to LIS. 300 ml output charted this morning.  Discussed with RN. Surgeon is placing central line at this time. Will be confirmed with x-ray prior to infusing TPN.  Also discussed with Pharmacist. Per Hospitalist plan will be to discontinue D5-NS with initiation of TPN.  Diet Order:  Diet NPO time specified  Skin:  Reviewed, no issues  Last BM:  03/05/2017 - type 7  Height:   Ht Readings from Last 1 Encounters:  03/04/17 5\' 2"  (1.575 m)    Weight:   Wt Readings from Last 1 Encounters:  03/04/17 152 lb 3.2 oz (69 kg)    Ideal Body Weight:  50 kg  BMI:  Body mass index is 27.84 kg/m.  Estimated Nutritional Needs:   Kcal:  1645-1880 (MSJ x 1.4-1.6)  Protein:  83-103 grams (1.2-1.5 grams/kg)  Fluid:  2 L/day (25 ml/kg)  EDUCATION NEEDS:   No education needs identified at this time  Willey Blade, MS, RD, LDN Pager: [phone] After Hours Pager: [phone]

## 2017-03-06 NOTE — Anesthesia Preprocedure Evaluation (Signed)
Anesthesia Evaluation  Patient identified by MRN, date of birth, ID band Patient awake    Reviewed: Allergy & Precautions, NPO status , Patient's Chart, lab work & pertinent test results  History of Anesthesia Complications Negative for: history of anesthetic complications  Airway Mallampati: III  TM Distance: >3 FB Neck ROM: Full    Dental  (+) Edentulous Upper, Poor Dentition   Pulmonary shortness of breath, asthma , COPD (wears 3L 02 continuously at home),  oxygen dependent, Current Smoker,    breath sounds clear to auscultation- rhonchi (-) wheezing      Cardiovascular hypertension, Pt. on medications (-) CAD, (-) Past MI and (-) Cardiac Stents  Rhythm:Regular Rate:Normal - Systolic murmurs and - Diastolic murmurs    Neuro/Psych  Headaches, negative psych ROS   GI/Hepatic Neg liver ROS, GERD  ,SBO    Endo/Other  diabetes, Type 2, Oral Hypoglycemic Agents  Renal/GU Renal disease     Musculoskeletal  (+) Arthritis ,   Abdominal (+) - obese,   Peds  Hematology  (+) anemia ,   Anesthesia Other Findings Past Medical History: No date: Anemia No date: Arthritis No date: Asthma 2009: Breast cancer ([place])     Comment: RT MASTECTOMY, DCIS 08/09/2015: Cancer of left breast ([place])     Comment: T4 N1 M1 tumor, INVASIVE LOBULAR CARCINOMA. No date: COPD (chronic obstructive pulmonary disease) (* No date: Diabetes mellitus without complication (HCC) No date: GERD (gastroesophageal reflux disease) No date: Headache     Comment: h/o migraines as a child No date: Hypertension 2015: Pneumonia No date: Shortness of breath dyspnea     Comment: with exertion   Reproductive/Obstetrics                             Anesthesia Physical Anesthesia Plan  ASA: III  Anesthesia Plan: General   Post-op Pain Management:    Induction: Intravenous  PONV Risk Score and Plan: 1 and Ondansetron and  Propofol  Airway Management Planned: Oral ETT  Additional Equipment:   Intra-op Plan:   Post-operative Plan: Extubation in OR  Informed Consent: I have reviewed the patients History and Physical, chart, labs and discussed the procedure including the risks, benefits and alternatives for the proposed anesthesia with the patient or authorized representative who has indicated his/her understanding and acceptance.   Dental advisory given  Plan Discussed with: CRNA and Anesthesiologist  Anesthesia Plan Comments:         Anesthesia Quick Evaluation

## 2017-03-06 NOTE — Procedures (Signed)
SURGICAL PROCEDURE REPORT  DATE OF PROCEDURE: 03/06/2017  ATTENDING Surgeon(s): [name]. 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, malnutrition(ICD-10's: K31.5, C79.9)  PROCEDURE(S): Insertion of Right internal jugular non-tunneled triple-lumen central venous catheter using ultrasound guidance (cpt: 27741)  INTRAOPERATIVE FINDINGS: Non-pulsatile venous blood return with clear visualization of venous access vessel on ultrasound   ESTIMATED BLOOD LOSS: Minimal (<20 mL)   SPECIMENS: None   IMPLANTS: Non-tunneled Right IJ triple-lumen central venous catheter  DRAINS: None   COMPLICATIONS: None apparent   CONDITION AT COMPLETION: Unchanged from pre-procedure   DISPOSITION: Remaining in patient's room (procedure performed bedside)   INDICATIONS FOR PROCEDURE: Patient is a 71 y.o. female presented to [facility] ED with N/V, was diagnosed with gastric outlet obstruction and metastatic breast cancer to spine and possibly causing external duodenal compression. TPN was advised due to patient not having eaten >1 week and anticipated prolonged NPO status. All risks, benefits, and alternatives to above elective procedure were discussed with the patient and her family, who together elected to proceed, and informed consent was accordingly obtained at that time.   DETAILS OF PROCEDURE: Patient was appropriately identified, and informed consent was confirmed to be obtained and documented in the patient's chart. Patient was placed in Trendelenburg position, and access site was prepped and draped in the usual sterile fashion using chlorhexidine. Access site was identified using direct ultrasound visualization, and 1% lidocaine was injected for local anesthesia. Using standard Seldinger technique,  access needle was inserted into the selected access vein with negative pressure applied to the attached syringe, and dark non-pulsatile venous blood was obtained. Loosely attached syringe was disconnected from the access needle, through which guidewire was advanced, over which access needle was withdrawn and dilator was advanced to skin level. A small skin incision was made adjacent to the guidewire/dilator, allowing dilator to be advanced through subcutaneous tissues and withdrawn over secured guidewire with gentle pressure applied to the access site. Triple-lumen central venous catheter, already flushed with sterile saline was then easily advanced over the guidewire, confirmed to flush easily, and secured to the patient's skin with 2-0 silk suture. Sterile dressing was applied, and all sharp instruments were appropriately disposed. Chest x-ray was ordered and reviewed with appropriate position of catheter tip and no pneumothorax identified.   Patient tolerated the procedure well.

## 2017-03-06 NOTE — Progress Notes (Signed)
Assessed for PICC placement. Patient c/o pain and unable to stretch her right arm for proper picc placement position. Left arm restricted due to mastectomy, with history of bilateral mastectomy. Spoke to Dr. Leslye Peer regarding the issue, he said, he will will inform the surgeon. Floor RN will continue to monitor and update IV team.

## 2017-03-06 NOTE — Anesthesia Post-op Follow-up Note (Cosign Needed)
Anesthesia QCDR form completed.        

## 2017-03-06 NOTE — Op Note (Signed)
[facility] Gastroenterology Patient Name: Sophia Nelson Procedure Date: 03/06/2017 1:29 PM MRN: [id number] Account #: [id number] Date of Birth: 03-18-1946 Admit Type: Inpatient Age: 71 Room: [facility] ENDO ROOM 1 Gender: Female Note Status: Finalized Procedure:            Upper GI endoscopy Indications:          Abnormal CT of the GI tract Providers:            [name] MD, MD Referring MD:         Edmonia Lynch. Aycock MD (Referring MD) Medicines:            Propofol per Anesthesia Complications:        No immediate complications. Procedure:            Pre-Anesthesia Assessment:                       - Prior to the procedure, a History and Physical was                        performed, and patient medications and allergies were                        reviewed. The patient's tolerance of previous                        anesthesia was also reviewed. The risks and benefits of                        the procedure and the sedation options and risks were                        discussed with the patient. All questions were                        answered, and informed consent was obtained. Prior                        Anticoagulants: The patient has taken no previous                        anticoagulant or antiplatelet agents. ASA Grade                        Assessment: III - A patient with severe systemic                        disease. After reviewing the risks and benefits, the                        patient was deemed in satisfactory condition to undergo                        the procedure.                       After obtaining informed consent, the endoscope was                        passed under direct vision. Throughout the procedure,  the patient's blood pressure, pulse, and oxygen                        saturations were monitored continuously. The                        Colonoscope was introduced through the mouth, and   advanced to the jejunum. The upper GI endoscopy was                        accomplished without difficulty. The patient tolerated                        the procedure well. Findings:      The examined esophagus was normal.      The entire examined stomach was normal.      An acquired extrinsic severe stenosis was found in the third portion of       the duodenum and was traversed. Impression:           - Normal esophagus.                       - Normal stomach.                       - Acquired duodenal stenosis.                       - No specimens collected. Recommendation:       - Return patient to hospital ward for ongoing care. Procedure Code(s):    --- Professional ---                       [phone], Esophagogastroduodenoscopy, flexible, transoral;                        diagnostic, including collection of specimen(s) by                        brushing or washing, when performed (separate procedure) Diagnosis Code(s):    --- Professional ---                       R93.3, Abnormal findings on diagnostic imaging of other                        parts of digestive tract                       K31.5, Obstruction of duodenum CPT copyright 2016 [employer]. All rights reserved. The codes documented in this report are preliminary and upon coder review may  be revised to meet current compliance requirements. [name] MD, MD 03/06/2017 2:16:31 PM This report has been signed electronically. Number of Addenda: 0 Note Initiated On: 03/06/2017 1:29 PM      [facility]

## 2017-03-06 NOTE — Transfer of Care (Signed)
Immediate Anesthesia Transfer of Care Note  Patient: Sophia Nelson  Procedure(s) Performed: Procedure(s): ESOPHAGOGASTRODUODENOSCOPY (EGD) WITH PROPOFOL (N/A)  Patient Location: PACU  Anesthesia Type:General  Level of Consciousness: awake, alert  and oriented  Airway & Oxygen Therapy: Patient Spontanous Breathing and Patient connected to face mask oxygen  Post-op Assessment: Report given to RN and Post -op Vital signs reviewed and stable  Post vital signs: Reviewed and stable  Last Vitals:  Vitals:   03/06/17 1314 03/06/17 1434  BP: (!) 155/80 (!) 156/76  Pulse: 72 95  Resp: 20 14  Temp: (!) 36.1 C 36.7 C    Last Pain:  Vitals:   03/06/17 1434  TempSrc:   PainSc: 10-Worst pain ever         Complications: No apparent anesthesia complications

## 2017-03-06 NOTE — Progress Notes (Signed)
Telephoned pt. Next of kin son listed on contact info voicemail stating person is not accepting calls at this time. Pt. Mother at bedside signed consent for central venous access. Dr. Rosana Hoes at bedside informed pt. And mother of procedure pt. Unable to give consent due to recent sedation

## 2017-03-06 NOTE — Progress Notes (Signed)
PHARMACY - ADULT TOTAL PARENTERAL NUTRITION CONSULT NOTE   Pharmacy Consult for TPN  Indication: Bowel obstruction  Patient Measurements: Height: 5\' 2"  (157.5 cm) Weight: 152 lb 3.2 oz (69 kg) IBW/kg (Calculated) : 50.1 TPN AdjBW (KG): 54.8 Body mass index is 27.84 kg/m.  Assessment: 71 yo female with functional duodenal obstruction with gastric distention.  Pt is at risk for refeeding syndrome.   GI:  Endo:  Insulin requirements in the past 24 hours: 0units Lytes:K+: 3.2  Mg: 2.3  Phos:  2.6 Renal:Scr: 0.82, CrCl: 45ml/min  Best Practices: TPN Access: Placed 7/11 TPN start date:7/11  Nutritional Goals (per RD recommendation on 7/11): [id number] kcal Protein: 96g  Current Nutrition: D5W/NaCl w/7mEq KCL @ 62mL/hr   Plan:  7/11 Per Dietitian, recommend initiating Clinimix E 5/15 at 40 ml/hr. KCL 30mEq IV x 3 ordered.   After 24 hrs if electrolytes, CBGs, and triglycerides are in acceptable range can advance to goal regimen of Clinimix E 5/15 at 80 ml/hr + 20% ILE at 20 ml/hr over 12 hours.  Goal regimen provides 1843 kcal, 96 grams of protein, 2160 ml fluid daily.  Will discontinue maintenance fluids  (D5W/NaCl w/65mEq KCL @ 28mL/hr ) per Dr. Leslye Peer.   Add  MVI and trace elements in TPN SSI Q4H, No units of regular insulin in TPN at this time Monitor TPN labs per protocol F/U daily   Pernell Dupre, PharmD, BCPS Clinical Pharmacist 03/06/2017 5:34 PM

## 2017-03-06 NOTE — Progress Notes (Signed)
Patient ID: Sophia Nelson, female   DOB: 1946-01-08, 71 y.o.   MRN: [id number]    [facility] PROGRESS NOTE  Sophia Nelson PIR:[id number] DOB: [birth year] DOA: 03/03/2017 PCP: Sophia Coffin, MD  HPI/Subjective: Patient still having pain in the abdomen. No further nausea vomiting with the NG tube in. Patient stated she had did have some bowel movements.  Objective: Vitals:   03/06/17 1504 03/06/17 1533  BP: 117/63 (!) 143/74  Pulse: 79 70  Resp: 11   Temp: 98.2 F (36.8 C)     Filed Weights   03/04/17 0100  Weight: 69 kg (152 lb 3.2 oz)    ROS: Review of Systems  Constitutional: Negative for chills and fever.  Eyes: Negative for blurred vision.  Respiratory: Negative for cough and shortness of breath.   Cardiovascular: Negative for chest pain.  Gastrointestinal: Positive for abdominal pain. Negative for constipation, diarrhea, nausea and vomiting.  Genitourinary: Negative for dysuria.  Musculoskeletal: Negative for joint pain.  Neurological: Negative for dizziness and headaches.   Exam: Physical Exam  Constitutional: She is oriented to person, place, and time.  HENT:  Nose: No mucosal edema.  Mouth/Throat: No oropharyngeal exudate or posterior oropharyngeal edema.  Eyes: Conjunctivae, EOM and lids are normal. Pupils are equal, round, and reactive to light.  Neck: No JVD present. Carotid bruit is not present. No edema present. No thyroid mass and no thyromegaly present.  Cardiovascular: S1 normal and S2 normal.  Exam reveals no gallop.   No murmur heard. Pulses:      Dorsalis pedis pulses are 2+ on the right side, and 2+ on the left side.  Respiratory: No respiratory distress. She has decreased breath sounds in the left lower field. She has no wheezes. She has no rhonchi. She has no rales.  GI: Soft. She exhibits distension. Bowel sounds are decreased. There is tenderness.  Musculoskeletal:       Right ankle: She exhibits swelling.       Left ankle: She exhibits  swelling.  Lymphadenopathy:    She has no cervical adenopathy.  Neurological: She is alert and oriented to person, place, and time. No cranial nerve deficit.  Skin: Skin is warm. No rash noted. Nails show no clubbing.  Psychiatric: She has a normal mood and affect.      Data Reviewed: Basic Metabolic Panel:  Recent Labs Lab 03/03/17 2140 03/04/17 0503 03/05/17 0520 03/06/17 0334  NA 137 137 136 139  K 4.0 3.2* 3.5 3.2*  CL 91* 93* 100* 104  CO2 32 35* 28 29  GLUCOSE 133* 127* 72 95  BUN 45* 41* 22* 10  CREATININE 1.49* 1.19* 0.82 0.82  CALCIUM 9.0 8.7* 8.5* 8.6*  MG 2.5*  --  2.3  --   PHOS 3.6  --   --   --    Liver Function Tests:  Recent Labs Lab 03/03/17 2140  AST 23  ALT 11*  ALKPHOS 70  BILITOT 1.0  PROT 6.7  ALBUMIN 3.4*    Recent Labs Lab 03/03/17 2140 03/04/17 0503 03/05/17 0520  LIPASE 146* 111* 35   CBC:  Recent Labs Lab 03/03/17 2027 03/04/17 0503  WBC 12.8* 10.6  HGB 10.6* 10.0*  HCT 31.1* 30.0*  MCV 80.6 82.6  PLT 409 383   Cardiac Enzymes:  Recent Labs Lab 03/03/17 2140 03/04/17 0503 03/04/17 1043 03/04/17 1826  TROPONINI <0.03 <0.03 <0.03 <0.03   BNP (last 3 results)  Recent Labs  02/24/17 1952  BNP 9.0  CBG:  Recent Labs Lab 03/06/17 0049 03/06/17 0358 03/06/17 0749 03/06/17 1158 03/06/17 1320  GLUCAP 92 104* 100* 105* 105*    Recent Results (from the past 240 hour(s))  Urine Culture     Status: Abnormal   Collection Time: 02/25/17  8:45 AM  Result Value Ref Range Status   Specimen Description URINE, RANDOM  Final   Special Requests NONE  Final   Culture MULTIPLE SPECIES PRESENT, SUGGEST RECOLLECTION (A)  Final   Report Status 02/26/2017 FINAL  Final  Urine Culture     Status: Abnormal   Collection Time: 03/05/17  9:50 AM  Result Value Ref Range Status   Specimen Description URINE, CLEAN CATCH  Final   Special Requests NONE  Final   Culture (A)  Final    <10,000 COLONIES/mL INSIGNIFICANT  GROWTH Performed at [facility], [place] [address] [place], [place] [address]    Report Status 03/06/2017 FINAL  Final     Studies: Ct Entero Abd/pelvis W Contast  Result Date: 03/06/2017 CLINICAL DATA:  Nausea and vomiting and abdominal distention for 1 week. Duodenal obstruction. 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 EXAM: CT ABDOMEN AND PELVIS WITH CONTRAST (ENTEROGRAPHY) TECHNIQUE: Multidetector CT of the abdomen and pelvis during bolus administration of intravenous contrast. Negative oral contrast was given. CONTRAST:  128mL ISOVUE-300 IOPAMIDOL (ISOVUE-300) INJECTION 61% COMPARISON:  03/04/2017 FINDINGS: Lower chest: No acute findings. Hepatobiliary: No masses identified. A few tiny calcified gallstones are seen. No evidence of cholecystitis. Mild diffuse biliary ductal dilatation is again seen, with common bile duct measuring approximately 13 mm. A sub-cm high attenuation focus is seen in the area of the distal common bile duct and ampulla on image 33/2 which may represent a distal common bile duct stone. Pancreas:  No mass or inflammatory changes. Spleen:  Within normal limits in size and appearance. Adrenals/Urinary Tract: No masses identified. No evidence of hydronephrosis. Small amount of high attenuation material seen along the dependent gallbladder wall, which is noted on prior exam. This may be due to blood products or urinary tract infection. Stomach/Bowel: Nasogastric tube is seen in the stomach. Stomach and duodenum are dilated. There is a short segment area of wall thickening involving the fourth portion the duodenum, which measures 3.5 cm in length on image 36/2. This is suspicious for obstructing duodenal neoplasm. Remainder of small bowel and colon are nondilated. No evidence of abnormal mucosal enhancement or mesenteric inflammatory changes. No evidence of enteric fistula, extraluminal gas or abnormal fluid collections. The terminal ileum is normal in  appearance. Vascular/Lymphatic: Tiny sub-cm lymph nodes are seen in the porta hepatis, celiac axis, and upper left paraaortic region. No pathologically enlarged lymph nodes identified. No abdominal aortic aneurysm. Aortic atherosclerosis. Reproductive: Prior hysterectomy noted. Adnexal regions are unremarkable in appearance. Other:  None. Musculoskeletal:  No suspicious bone lesions identified. IMPRESSION: Duodenal obstruction with short segment wall thickening in the fourth portion of the duodenum, suspicious for duodenal neoplasm. Recommend endoscopy for further evaluation. Nonspecific sub-cm upper abdominal lymph nodes in the gastrohepatic ligament, porta hepatis, celiac axis, and upper left paraaortic region. Although these may be reactive in etiology, early lymph node metastases cannot excluded. Cholelithiasis. Mild biliary ductal dilatation, with possible small distal common bile duct calculus. Suggest correlation with liver function tests; consider further evaluation with abdomen MRI and MRCP without and with contrast if warranted clinically. Small amount high high attenuation material in the urinary bladder, which may be due to blood products or urinary tract  infection. Recommend correlation with urinalysis. Electronically Signed   By: Earle Gell M.D.   On: 03/06/2017 08:31    Scheduled Meds: . dextrose  25 mL Intravenous Once  . enoxaparin (LOVENOX) injection  40 mg Subcutaneous Q24H  . fluticasone  2 spray Each Nare Daily  . letrozole  2.5 mg Oral Daily  . loratadine  10 mg Oral Daily  . mometasone-formoterol  2 puff Inhalation BID  . montelukast  10 mg Oral QHS  . palbociclib  125 mg Oral Q lunch  . pantoprazole (PROTONIX) IV  40 mg Intravenous Q24H  . tiotropium  18 mcg Inhalation BH-q7a   Continuous Infusions: . cefTRIAXone (ROCEPHIN)  IV Stopped (03/06/17 1030)  . dextrose 5 % and 0.9 % NaCl with KCl 20 mEq/L 75 mL/hr at 03/06/17 0600  . potassium chloride       Assessment/Plan:  1. Functional duodenal obstruction with gastric distention. NG tube ordered to low suction. Surgical team following. Case discussed with general surgery who is thinking about an operation on Friday. He recommends central line and TPN. Pharmacy consult for TPN 2. Relative hypoglycemia. Add D5 to IV fluids. Glipizide on hold. Check fingersticks every 4 hours. IV fluids can be stopped once TPN is started 3. Hypokalemia. Continue potassium in the IV fluids. IV fluids can be stopped once TPN is started 4. Metastatic breast cancer. On letrozole and palbociclib. Appreciate oncology consultation 5. GERD on Protonix 6. COPD on Spiriva 7. Hold all nonessential medications 8. Stop antibiotics 9. Lipase normalized with IV fluids.  Code Status:     Code Status Orders        Start     Ordered   03/04/17 0124  Full code  Continuous     03/04/17 0123    Code Status History    Date Active Date Inactive Code Status Order ID Comments User Context   02/24/2017 11:40 PM 02/26/2017  5:06 PM Full Code [id number]  Lance Coon, MD Inpatient     Family Communication: General surgery to update family Disposition Plan: TBD  Consultants:  General surgery  Oncology  Gastroenterology  Time spent: 24 minutes  [place], [facility]

## 2017-03-06 NOTE — Telephone Encounter (Signed)
Discussed with Dr.Davis- concern for extrinsic compression at the level of the duodenum on the endoscopy. As per radiology recommend a PET scan. We will reimaging the tumor conference tomorrow. Patient will likely benefit from diverting surgery.

## 2017-03-06 NOTE — Anesthesia Procedure Notes (Signed)
Procedure Name: Intubation Date/Time: 03/06/2017 1:48 PM Performed by: Jonna Clark Pre-anesthesia Checklist: Patient identified, Patient being monitored, Timeout performed, Emergency Drugs available and Suction available Patient Re-evaluated:Patient Re-evaluated prior to induction Oxygen Delivery Method: Circle system utilized Preoxygenation: Pre-oxygenation with 100% oxygen Induction Type: IV induction, Rapid sequence and Cricoid Pressure applied Ventilation: Mask ventilation without difficulty Laryngoscope Size: Mac and 3 Grade View: Grade I Tube type: Oral Tube size: 7.0 mm Number of attempts: 1 Airway Equipment and Method: Stylet Placement Confirmation: ETT inserted through vocal cords under direct vision,  positive ETCO2 and breath sounds checked- equal and bilateral Secured at: 21 cm Tube secured with: Tape Dental Injury: Teeth and Oropharynx as per pre-operative assessment

## 2017-03-06 NOTE — Progress Notes (Signed)
SURGICAL PROGRESS NOTE (cpt [phone])  Hospital Day(s): 2.   Post op day(s):  Sophia Nelson   Interval History: Patient seen and examined, no acute events or new complaints overnight. Patient reports improved upper abdominal pain and nausea, denies fever/chills, CP, or SOB.  Review of Systems:  Constitutional: denies fever, chills  HEENT: denies cough or congestion  Respiratory: denies any shortness of breath  Cardiovascular: denies chest pain or palpitations  Gastrointestinal: abdominal pain, N/V, and bowel function as per interval history Genitourinary: denies burning with urination or urinary frequency Musculoskeletal: denies pain, decreased motor or sensation Integumentary: denies any other rashes or skin discolorations Neurological: denies HA or vision/hearing changes   Vital signs in last 24 hours: [min-max] current  Temp:  [98.1 F (36.7 C)-98.9 F (37.2 C)] 98.7 F (37.1 C) (07/11 0821) Pulse Rate:  [73-88] 73 (07/11 0821) Resp:  [10] 10 (07/10 1209) BP: (101-135)/(51-71) 132/57 (07/11 0821) SpO2:  [98 %-100 %] 98 % (07/11 0821)     Height: 5\' 2"  (157.5 cm) Weight: 152 lb 3.2 oz (69 kg) BMI (Calculated): 27.9   Intake/Output this shift:  No intake/output data recorded.   Intake/Output last 2 shifts:  @IOLAST2SHIFTS @   Physical Exam:  Constitutional: alert, cooperative and no distress  HENT: normocephalic without obvious abnormality  Eyes: PERRL, EOM's grossly intact and symmetric  Neuro: CN II - XII grossly intact and symmetric without deficit  Respiratory: breathing non-labored at rest  Cardiovascular: regular rate and sinus rhythm  Gastrointestinal: soft, non-tender, and non-distended with NG tube secured draining bilious drainage Musculoskeletal: UE and LE FROM, no edema or wounds, motor and sensation grossly intact, NT   Labs:  CBC Latest Ref Rng & Units 03/04/2017 03/03/2017 02/25/2017  WBC 3.6 - 11.0 K/uL 10.6 12.8(H) 15.3(H)  Hemoglobin 12.0 - 16.0 g/dL 10.0(L) 10.6(L)  9.7(L)  Hematocrit 35.0 - 47.0 % 30.0(L) 31.1(L) 29.2(L)  Platelets 150 - 440 K/uL 383 409 468(H)   CMP Latest Ref Rng & Units 03/06/2017 03/05/2017 03/04/2017  Glucose 65 - 99 mg/dL 95 72 127(H)  BUN 6 - 20 mg/dL 10 22(H) 41(H)  Creatinine 0.44 - 1.00 mg/dL 0.82 0.82 1.19(H)  Sodium 135 - 145 mmol/L 139 136 137  Potassium 3.5 - 5.1 mmol/L 3.2(L) 3.5 3.2(L)  Chloride 101 - 111 mmol/L 104 100(L) 93(L)  CO2 22 - 32 mmol/L 29 28 35(H)  Calcium 8.9 - 10.3 mg/dL 8.6(L) 8.5(L) 8.7(L)  Total Protein 6.5 - 8.1 g/dL - - -  Total Bilirubin 0.3 - 1.2 mg/dL - - -  Alkaline Phos 38 - 126 U/L - - -  AST 15 - 41 U/L - - -  ALT 14 - 54 U/L - - -   Imaging studies:  CT Enterography with Contrast (03/06/2017) - personally reviewed and discussed with patient Nasogastric tube is seen in the stomach. Stomach and duodenum are dilated. There is a short segment area of wall thickening involving the fourth portion the duodenum, which measures 3.5 cm in length on image 36/2. This is suspicious for obstructing duodenal neoplasm.  Remainder of small bowel and colon are nondilated. No evidence of abnormal mucosal enhancement or mesenteric inflammatory changes. No evidence of enteric fistula, extraluminal gas or abnormal fluid collections. The terminal ileum is normal in appearance.  Vascular/Lymphatic: Tiny sub-cm lymph nodes are seen in the porta hepatis, celiac axis, and upper left paraaortic region. No pathologically enlarged lymph nodes identified. No abdominal aortic aneurysm. Aortic atherosclerosis.  Reproductive: Prior hysterectomy noted.  Assessment/Plan: (ICD-10's: K31.5,  C79.9) 71 y.o.femalewith duodenal obstruction at approximately ligament of Treitz, complicated by pertinent comorbidities including newly diagnosed likely metastatic breast cancer, DM, HTN, shortness of breath on exertion attributable to COPD not on home O2, GERD, chronic anemia, and osteoarthritis.  - nasogastric  decompression - IVF for now and correct electrolytes - will need CVC + TPN for nutrition considering prolonged NPO             - importance of nutrition discussed at length with patient, who initially refused PICC for TPN - will discuss at tumor board conference tomorrow, PET to evaluate for metastatic disease - anticipate gastrojejunostomy, possibly 7/13, for bypass of obstructing duodenal mass  - EGD with push enteroscopy reviewed during procedure by Dr. Allen Norris today - medical optimization and management of comorbidities - DVT prophylaxis  All of the above findings and recommendations were discussed with the patient, patient's family, and patient's RN, and all of patient's and family's questions were answered to their expressed satisfaction.  Thank you for the opportunity to participate in this patient's care.  -- [name] Rosana Hoes, MD, [place]: [facility] - Partnering for exceptional care. Office: [phone]

## 2017-03-06 NOTE — Care Management (Signed)
Remains npo and copious amount of drainage from nasogastric tube.  Patient to be placed on TPN. Discussion of PET Scan and transfer off 2A.

## 2017-03-07 ENCOUNTER — Inpatient Hospital Stay: Payer: Medicare HMO

## 2017-03-07 ENCOUNTER — Encounter: Payer: Self-pay | Admitting: Gastroenterology

## 2017-03-07 DIAGNOSIS — Z7951 Long term (current) use of inhaled steroids: Secondary | ICD-10-CM

## 2017-03-07 LAB — BASIC METABOLIC PANEL
Anion gap: 5 (ref 5–15)
BUN: 9 mg/dL (ref 6–20)
CHLORIDE: 103 mmol/L (ref 101–111)
CO2: 30 mmol/L (ref 22–32)
CREATININE: 0.71 mg/dL (ref 0.44–1.00)
Calcium: 8.8 mg/dL — ABNORMAL LOW (ref 8.9–10.3)
GFR calc Af Amer: >60 mL/min (ref >60)
GFR calc non Af Amer: >60 mL/min (ref >60)
GLUCOSE: 149 mg/dL — AB (ref 65–99)
POTASSIUM: 4.2 mmol/L (ref 3.5–5.1)
Sodium: 138 mmol/L (ref 135–145)

## 2017-03-07 LAB — GLUCOSE, CAPILLARY
GLUCOSE-CAPILLARY: 117 mg/dL — AB (ref 65–99)
GLUCOSE-CAPILLARY: 134 mg/dL — AB (ref 65–99)
GLUCOSE-CAPILLARY: 159 mg/dL — AB (ref 65–99)
Glucose-Capillary: 126 mg/dL — ABNORMAL HIGH (ref 65–99)
Glucose-Capillary: 130 mg/dL — ABNORMAL HIGH (ref 65–99)
Glucose-Capillary: 156 mg/dL — ABNORMAL HIGH (ref 65–99)
Glucose-Capillary: 164 mg/dL — ABNORMAL HIGH (ref 65–99)

## 2017-03-07 LAB — TRIGLYCERIDES: Triglycerides: 114 mg/dL (ref <150)

## 2017-03-07 LAB — PHOSPHORUS: PHOSPHORUS: 2.5 mg/dL (ref 2.5–4.6)

## 2017-03-07 LAB — MAGNESIUM: Magnesium: 1.7 mg/dL (ref 1.7–2.4)

## 2017-03-07 MED ORDER — CHLORHEXIDINE GLUCONATE CLOTH 2 % EX PADS
6.0000 | MEDICATED_PAD | Freq: Once | CUTANEOUS | Status: AC
  Administered 2017-03-08: 6 via TOPICAL

## 2017-03-07 MED ORDER — LIDOCAINE VISCOUS 2 % MT SOLN
15.0000 mL | OROMUCOSAL | Status: DC | PRN
  Filled 2017-03-07: qty 15

## 2017-03-07 MED ORDER — LORAZEPAM 2 MG/ML IJ SOLN
0.5000 mg | Freq: Once | INTRAMUSCULAR | Status: AC
  Administered 2017-03-07: 0.5 mg via INTRAVENOUS
  Filled 2017-03-07: qty 1

## 2017-03-07 MED ORDER — LIDOCAINE HCL 2 % EX GEL
1.0000 "application " | Freq: Once | CUTANEOUS | Status: AC
  Administered 2017-03-07: 1
  Filled 2017-03-07: qty 5

## 2017-03-07 MED ORDER — TRACE MINERALS CR-CU-MN-SE-ZN 10-1000-500-60 MCG/ML IV SOLN
INTRAVENOUS | Status: AC
  Administered 2017-03-07: 18:00:00 via INTRAVENOUS
  Filled 2017-03-07: qty 1920

## 2017-03-07 MED ORDER — FLUDEOXYGLUCOSE F - 18 (FDG) INJECTION
13.0000 | Freq: Once | INTRAVENOUS | Status: AC | PRN
  Administered 2017-03-07: 13 via INTRAVENOUS

## 2017-03-07 MED ORDER — CEFAZOLIN SODIUM-DEXTROSE 2-4 GM/100ML-% IV SOLN
2.0000 g | INTRAVENOUS | Status: DC
  Filled 2017-03-07 (×2): qty 100

## 2017-03-07 MED ORDER — FAT EMULSION 20 % IV EMUL
250.0000 mL | INTRAVENOUS | Status: AC
  Administered 2017-03-07: 250 mL via INTRAVENOUS
  Filled 2017-03-07: qty 250

## 2017-03-07 MED ORDER — OXYMETAZOLINE HCL 0.05 % NA SOLN
8.0000 | Freq: Two times a day (BID) | NASAL | Status: DC
  Administered 2017-03-07: 8 via NASAL
  Filled 2017-03-07: qty 15

## 2017-03-07 MED ORDER — CHLORHEXIDINE GLUCONATE CLOTH 2 % EX PADS
6.0000 | MEDICATED_PAD | Freq: Once | CUTANEOUS | Status: AC
  Administered 2017-03-07: 23:00:00 6 via TOPICAL

## 2017-03-07 NOTE — Progress Notes (Signed)
Initial Nutrition Assessment  DOCUMENTATION CODES:   Severe malnutrition in context of acute illness/injury  INTERVENTION:  Recommend advancing to goal regimen of Clinimix E 5/15 at 80 ml/hr + 20% ILE at 20 ml/hr over 12 hours. Goal regimen provides 1843 kcal, 96 grams of protein, 2160 ml fluid daily.  NUTRITION DIAGNOSIS:   Malnutrition (Severe) related to acute illness (small bowel obstruction) as evidenced by energy intake < or equal to 50% for > or equal to 5 days, percent weight loss.  Ongoing.  GOAL:   Patient will meet greater than or equal to 90% of their needs  Progressing with advancement to goal TPN rate tonight.  MONITOR:   Diet advancement, Labs, Weight trends, I & O's  REASON FOR ASSESSMENT:   Consult New TPN/TNA  ASSESSMENT:   71 year old female with PMHx of HTN, DM type 2, GERD, COPD, hx right breast cancer s/p mastectomy in 2009, hx of left breast cancer s/p mastectomy in 2017, recent admission 7/1-7/3 with abdominal pain, N/V for gastric outlet obstruction vs gastroparesis. 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  -Pt had PET scan today. Found hypermetabolic metastatic lesion in left aspect of L3 vertebral body with apparent extension into left epidural space. -Patient's NGT came out this morning. Per chart patient has been hesitant on replacing it. Was not in at time of RD assessment.  Patient more alert today, so able to get some history. Patient reports that for the past two weeks PTA she has not been able to tolerate any foods or liquids by mouth in setting of her N/V and abdominal pain. She reports that two weeks prior to that, she was able to tolerate small amounts of food, but was not eating well and still having frequent N/V. Patient has had approximately 1 month of inadequate intake and two weeks of almost no PO intake.   Patient reports her UBW was 178 lbs and she has lost 26.3 lbs  (14.8% body weight) over the past year. Per chart she was 166.6 lbs on 11/12/2016 and lost 14.5 lbs (8.7% body weight) over 3 months, which is significant for time frame.  Access: right IJ triple lumen CVC placed 7/11 with tip projecting over SVC per chest x-ray 7/11  TPN: pt tolerating Clinimix E 5/15 at 40 ml/hr  Medications reviewed and include: letrozole, palbociclib, pantoprazole, cefazolin, Novolog 0-9 units Q6hrs (received 2 units past 24 hrs).   Labs reviewed: CBG 117-185 past 24 hrs. Potassium, Phosphorus, and Magnesium WNL. Triglycerides 114.   I/O: 1.1 L output from NGT past 24 hrs, 500 ml output urine + 1 unmeasured output.   Weight trend: 68.8 kg on 7/12 - wt stable from admission wt  Discussed with RN.  Discussed nutrition plan of care with Dr. Rosana Hoes and Dr. Leslye Peer. Plan is to advance to goal TPN rate tonight. Per Dr. Rosana Hoes plan is for gastrojejunostomy tomorrow in OR (possible biopsy if able to safely do so).   Diet Order:  .TPN (CLINIMIX-E) Adult .TPN (CLINIMIX-E) Adult Diet NPO time specified  Skin:  Reviewed, no issues  Last BM:  03/05/2017 - type 7  Height:   Ht Readings from Last 1 Encounters:  03/04/17 5\' 2"  (1.575 m)    Weight:   Wt Readings from Last 1 Encounters:  03/07/17 151 lb 11.2 oz (68.8 kg)    Ideal Body Weight:  50 kg  BMI:  Body mass index is 27.75 kg/m.  Estimated  Nutritional Needs:   Kcal:  1645-1880 (MSJ x 1.4-1.6)  Protein:  83-103 grams (1.2-1.5 grams/kg)  Fluid:  2 L/day (25 ml/kg)  EDUCATION NEEDS:   No education needs identified at this time  Willey Blade, MS, RD, LDN Pager: [phone] After Hours Pager: [phone]

## 2017-03-07 NOTE — Progress Notes (Signed)
PHARMACY - ADULT TOTAL PARENTERAL NUTRITION CONSULT NOTE   Pharmacy Consult for TPN  Indication: Bowel obstruction  Patient Measurements: Height: 5\' 2"  (157.5 cm) Weight: 151 lb 11.2 oz (68.8 kg) IBW/kg (Calculated) : 50.1 TPN AdjBW (KG): 54.8 Body mass index is 27.75 kg/m.  Assessment: 71 yo female with functional duodenal obstruction with gastric distention.  Pt is at risk for refeeding syndrome.   GI:  Endo:  Insulin requirements in the past 24 hours: 2 units Lytes:K+: 4.2 Mg: 1.7  Phos:  2.5 Renal:Scr: 0.71, CrCl: 60.5ml/min  Best Practices: TPN Access: Placed 7/11 TPN start date:7/11  Nutritional Goals (per RD recommendation on 7/11): JMEQ:6834 kcal Protein: 96g  Current Nutrition: D5W/NaCl w/29mEq KCL @ 31mL/hr   Plan:    Patients labs are WNL. Will increase TPN to goal regimen of Clinimix E 5/15 at 80 ml/hr + 20% ILE at 20 ml/hr over 12 hours.  Goal regimen provides 1843 kcal, 96 grams of protein, 2160 ml fluid daily.   Add  MVI and trace elements in TPN SSI Q6H, No units of regular insulin in TPN at this time Monitor TPN labs per protocol F/U daily   Ramond Dial, PharmD, BCPS Clinical Pharmacist 03/07/2017 12:24 PM

## 2017-03-07 NOTE — Progress Notes (Signed)
Sophia Nelson   DOB:[birth year]   KD#:[id number]    Subjective: Patient continues to have output from the NG tube. Intermittent abdominal discomfort. Passing gas. No chest pain or shortness of breath.  Objective:  Vitals:   03/07/17 1600 03/07/17 1652  BP:    Pulse:    Resp:  (!) 24  Temp: 98.4 F (36.9 C) 98.2 F (36.8 C)     Intake/Output Summary (Last 24 hours) at 03/07/17 1925 Last data filed at 03/07/17 1803  Gross per 24 hour  Intake              930 ml  Output             2300 ml  Net            -1370 ml    GENERAL: Well-nourished well-developed; Alert, mild distress and comfortable.   Alone. NG tube in place. EYES: no pallor or icterus OROPHARYNX: no thrush or ulceration. NECK: supple, no masses felt LYMPH:  no palpable lymphadenopathy in the cervical, axillary or inguinal regions LUNGS: decreased breath sounds to auscultation at bases and  No wheeze or crackles HEART/CVS: regular rate & rhythm and no murmurs; No lower extremity edema ABDOMEN: abdomen soft, tender and distended. Musculoskeletal:no cyanosis of digits and no clubbing  PSYCH: alert & oriented x 3 with fluent speech NEURO: no focal motor/sensory deficits SKIN:  no rashes or significant lesions Labs:  Lab Results  Component Value Date   WBC 11.7 (H) 03/06/2017   HGB 9.2 (L) 03/06/2017   HCT 28.6 (L) 03/06/2017   MCV 83.1 03/06/2017   PLT 379 03/06/2017   NEUTROABS 4.2 02/13/2017    Lab Results  Component Value Date   NA 138 03/06/2017   K 4.2 03/06/2017   CL 103 03/06/2017   CO2 30 03/06/2017    Studies:  Dg Abd 1 View  Result Date: 03/07/2017 CLINICAL DATA:  Nasogastric tube placement. EXAM: ABDOMEN - 1 VIEW COMPARISON:  PET- CT March 07, 2017 at 1400 hours FINDINGS: Nasogastric tube tip and side-port project in proximal stomach. Bowel gas pattern is nondilated and nonobstructive. No intra-abdominal mass effect or pathologic calcifications. Patient's known L3 metastasis difficult to identify  by plain radiography. IMPRESSION: Nasogastric tube tip projects in proximal stomach. Electronically Signed   By: Elon Alas M.D.   On: 03/07/2017 17:57   Nm Pet Image Restag (ps) Skull Base To Thigh  Result Date: 03/07/2017 CLINICAL DATA:  Subsequent Treatment strategy for left breast cancer. Bone metastases. EXAM: NUCLEAR MEDICINE PET SKULL BASE TO THIGH TECHNIQUE: 13.0 mCi F-18 FDG was injected intravenously. Full-ring PET imaging was performed from the skull base to thigh after the radiotracer. CT data was obtained and used for attenuation correction and anatomic localization. FASTING BLOOD GLUCOSE:  Value: 130 mg/dl COMPARISON:  PET-CT 12/20/2015. Abdomen and pelvis CT from 03/05/2017. Chest CT 02/24/2017 FINDINGS: NECK No hypermetabolic lymph nodes in the neck. CHEST No hypermetabolic mediastinal or hilar nodes. No suspicious pulmonary nodules on the CT scan. There is mild diffuse FDG uptake in the left pectoralis minor muscle which is asymmetrically larger than the right. There is no focal lesion within the muscle and the uptake is diffuse, not focal. Emphysema noted in the lungs. Bibasilar collapse/consolidation evident, left greater than right. Small left pleural effusion noted. ABDOMEN/PELVIS No abnormal hypermetabolic activity within the liver, pancreas, adrenal glands, or spleen. No hypermetabolic lymph nodes in the abdomen or pelvis. Stomach and duodenum less distended than on the diagnostic  CT of 02/24/2017. Small lymph nodes in the gastrohepatic ligament and para- aortic space are unchanged since 02/24/2017 and 12/20/2015. No hypermetabolism in these lymph nodes on today's study. Mild right hydronephrosis is stable. Trace free fluid is noted in the cul-de-sac. SKELETON No focal hypermetabolic activity to suggest skeletal metastasis. IMPRESSION: 1. Interval development of hypermetabolic metastatic lesion in the left aspect of the L3 vertebral body with apparent extension into the left  epidural space on CT images. Lumbar spine MRI without with contrast could be used to confirm epidural tumor spread. 2. No other evidence for hypermetabolic metastatic disease in the neck, chest, abdomen, or pelvis. 3. Diffuse FDG uptake identified in the left pectoralis minor muscle, indeterminate as there is no focal lesion within the muscle. Given the asymmetry, some component of muscle edema or inflammation may be present accounting for the asymmetric uptake. Electronically Signed   By: Misty Stanley M.D.   On: 03/07/2017 15:27   Ct Entero Abd/pelvis W Contast  Result Date: 03/06/2017 CLINICAL DATA:  Nausea and vomiting and abdominal distention for 1 week. Duodenal obstruction. 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 EXAM: CT ABDOMEN AND PELVIS WITH CONTRAST (ENTEROGRAPHY) TECHNIQUE: Multidetector CT of the abdomen and pelvis during bolus administration of intravenous contrast. Negative oral contrast was given. CONTRAST:  160mL ISOVUE-300 IOPAMIDOL (ISOVUE-300) INJECTION 61% COMPARISON:  03/04/2017 FINDINGS: Lower chest: No acute findings. Hepatobiliary: No masses identified. A few tiny calcified gallstones are seen. No evidence of cholecystitis. Mild diffuse biliary ductal dilatation is again seen, with common bile duct measuring approximately 13 mm. A sub-cm high attenuation focus is seen in the area of the distal common bile duct and ampulla on image 33/2 which may represent a distal common bile duct stone. Pancreas:  No mass or inflammatory changes. Spleen:  Within normal limits in size and appearance. Adrenals/Urinary Tract: No masses identified. No evidence of hydronephrosis. Small amount of high attenuation material seen along the dependent gallbladder wall, which is noted on prior exam. This may be due to blood products or urinary tract infection. Stomach/Bowel: Nasogastric tube is seen in the stomach. Stomach and duodenum are dilated. There is a short segment area of wall  thickening involving the fourth portion the duodenum, which measures 3.5 cm in length on image 36/2. This is suspicious for obstructing duodenal neoplasm. Remainder of small bowel and colon are nondilated. No evidence of abnormal mucosal enhancement or mesenteric inflammatory changes. No evidence of enteric fistula, extraluminal gas or abnormal fluid collections. The terminal ileum is normal in appearance. Vascular/Lymphatic: Tiny sub-cm lymph nodes are seen in the porta hepatis, celiac axis, and upper left paraaortic region. No pathologically enlarged lymph nodes identified. No abdominal aortic aneurysm. Aortic atherosclerosis. Reproductive: Prior hysterectomy noted. Adnexal regions are unremarkable in appearance. Other:  None. Musculoskeletal:  No suspicious bone lesions identified. IMPRESSION: Duodenal obstruction with short segment wall thickening in the fourth portion of the duodenum, suspicious for duodenal neoplasm. Recommend endoscopy for further evaluation. Nonspecific sub-cm upper abdominal lymph nodes in the gastrohepatic ligament, porta hepatis, celiac axis, and upper left paraaortic region. Although these may be reactive in etiology, early lymph node metastases cannot excluded. Cholelithiasis. Mild biliary ductal dilatation, with possible small distal common bile duct calculus. Suggest correlation with liver function tests; consider further evaluation with abdomen MRI and MRCP without and with contrast if warranted clinically. Small amount high high attenuation material in the urinary bladder, which may be due to blood products or urinary tract infection. Recommend correlation with urinalysis.  Electronically Signed   By: Earle Gell M.D.   On: 03/06/2017 08:31   Dg Chest Port 1 View  Result Date: 03/06/2017 CLINICAL DATA:  Central line placement, metastatic breast cancer EXAM: PORTABLE CHEST 1 VIEW COMPARISON:  Portable exam at 1653 hours compared to 03/04/2017 FINDINGS: Nasogastric tube extends  into stomach. New RIGHT jugular central venous catheter with tip projecting over SVC. Normal heart size, mediastinal contours, and pulmonary vascularity. Atherosclerotic calcification aorta. Emphysematous changes with LEFT basilar atelectasis and small pleural effusion. Upper lungs clear. No pneumothorax. Bones demineralized. IMPRESSION: No pneumothorax following RIGHT jugular line placement. Emphysematous changes with LEFT pleural effusion and basilar atelectasis. Aortic Atherosclerosis (ICD10-I70.0) and Emphysema (ICD10-J43.9). Electronically Signed   By: Lavonia Dana M.D.   On: 03/06/2017 17:06    Assessment & Plan:   71 year old female patient with metastatic breast cancer currently admitted the hospital for recurrent small bowel obstruction  # Metastatic lobular cancer of the breast- on letrozole+ Ibrance [currently on HOLD]; PET scan reviewed- no obvious hypermetabolic lesions noted in the abdomen that could potentially explain her small bowel obstruction. Lesion noted at L2/lytic- we'll plan MRI after the surgery.   # Recurrent small bowel obstruction-unclear etiology. Question extrinsic compression based on endoscopy. Await surgery tomorrow  # Anemia- Hb-10/ sec to Ibrance.  # Patient's case was discussed at tumor conference/images reviewed. Discussed with Dr. Rosana Hoes. The above plan was also discussed with son Barbaraann Rondo over the phone [[phone]].   [name], MD 03/07/2017  7:25 PM

## 2017-03-07 NOTE — Progress Notes (Signed)
PT Cancellation Note  Patient Details Name: Sophia Nelson MRN: [id number] DOB: [birth year]   Cancelled Treatment:    Reason Eval/Treat Not Completed: Fatigue/lethargy limiting ability to participate   Attempted therapy sesison this am.  Pt in recliner at bedside but appears generally fatigued.  She declined session stating "I'm tired".  She did not request to get back to bed.  She did state she would need a bedside commode upon discharge.   Chesley Noon 03/07/2017, 10:17 AM

## 2017-03-07 NOTE — Progress Notes (Signed)
Patient ID: Sophia Nelson, female   DOB: 1946-03-12, 71 y.o.   MRN: [id number]    [facility] PROGRESS NOTE  Sophia Nelson JJK:[id number] DOB: March 07, 1946 DOA: 03/03/2017 PCP: Donnie Coffin, MD  HPI/Subjective: Patient still having pain in the abdomen.  Objective: Vitals:   03/07/17 0858 03/07/17 1213  BP: (!) 136/47 (!) 124/47  Pulse: 63 64  Resp: 12 12  Temp:      Filed Weights   03/04/17 0100 03/07/17 0428  Weight: 69 kg (152 lb 3.2 oz) 68.8 kg (151 lb 11.2 oz)    ROS: Review of Systems  Constitutional: Negative for chills and fever.  Eyes: Negative for blurred vision.  Respiratory: Negative for cough and shortness of breath.   Cardiovascular: Negative for chest pain.  Gastrointestinal: Positive for abdominal pain. Negative for constipation, diarrhea, nausea and vomiting.  Genitourinary: Negative for dysuria.  Musculoskeletal: Negative for joint pain.  Neurological: Negative for dizziness and headaches.   Exam: Physical Exam  Constitutional: She is oriented to person, place, and time.  HENT:  Nose: No mucosal edema.  Mouth/Throat: No oropharyngeal exudate or posterior oropharyngeal edema.  Eyes: Pupils are equal, round, and reactive to light. Conjunctivae, EOM and lids are normal.  Neck: No JVD present. Carotid bruit is not present. No edema present. No thyroid mass and no thyromegaly present.  Cardiovascular: S1 normal and S2 normal.  Exam reveals no gallop.   No murmur heard. Pulses:      Dorsalis pedis pulses are 2+ on the right side, and 2+ on the left side.  Respiratory: No respiratory distress. She has decreased breath sounds in the left lower field. She has no wheezes. She has no rhonchi. She has no rales.  GI: Soft. She exhibits distension. Bowel sounds are decreased. There is tenderness.  Musculoskeletal:       Right ankle: She exhibits swelling.       Left ankle: She exhibits swelling.  Lymphadenopathy:    She has no cervical adenopathy.   Neurological: She is alert and oriented to person, place, and time. No cranial nerve deficit.  Skin: Skin is warm. No rash noted. Nails show no clubbing.  Psychiatric: She has a normal mood and affect.      Data Reviewed: Basic Metabolic Panel:  Recent Labs Lab 03/03/17 2140 03/04/17 0503 03/05/17 0520 03/06/17 0334 03/06/17 0500 03/06/17 1808  NA 137 137 136 139 138  --   K 4.0 3.2* 3.5 3.2* 4.2  --   CL 91* 93* 100* 104 103  --   CO2 32 35* 28 29 30   --   GLUCOSE 133* 127* 72 95 149*  --   BUN 45* 41* 22* 10 9  --   CREATININE 1.49* 1.19* 0.82 0.82 0.71  --   CALCIUM 9.0 8.7* 8.5* 8.6* 8.8*  --   MG 2.5*  --  2.3  --  1.7  --   PHOS 3.6  --   --   --  2.5 2.6   Liver Function Tests:  Recent Labs Lab 03/03/17 2140 03/06/17 1808  AST 23 21  ALT 11* 12*  ALKPHOS 70 67  BILITOT 1.0 0.7  PROT 6.7 6.6  ALBUMIN 3.4* 3.1*    Recent Labs Lab 03/03/17 2140 03/04/17 0503 03/05/17 0520  LIPASE 146* 111* 35   CBC:  Recent Labs Lab 03/03/17 2027 03/04/17 0503 03/06/17 1808  WBC 12.8* 10.6 11.7*  HGB 10.6* 10.0* 9.2*  HCT 31.1* 30.0* 28.6*  MCV  80.6 82.6 83.1  PLT 409 383 379   Cardiac Enzymes:  Recent Labs Lab 03/03/17 2140 03/04/17 0503 03/04/17 1043 03/04/17 1826  TROPONINI <0.03 <0.03 <0.03 <0.03   BNP (last 3 results)  Recent Labs  02/24/17 1952  BNP 9.0     CBG:  Recent Labs Lab 03/06/17 2104 03/07/17 0040 03/07/17 0458 03/07/17 0749 03/07/17 1202  GLUCAP 185* 159* 117* 134* 130*    Recent Results (from the past 240 hour(s))  Urine Culture     Status: Abnormal   Collection Time: 03/05/17  9:50 AM  Result Value Ref Range Status   Specimen Description URINE, CLEAN CATCH  Final   Special Requests NONE  Final   Culture (A)  Final    <10,000 COLONIES/mL INSIGNIFICANT GROWTH Performed at [facility], [place] [address] [place], [place] [address]    Report Status 03/06/2017 FINAL  Final     Studies: Nm Pet Image  Restag (ps) Skull Base To Thigh  Result Date: 03/07/2017 CLINICAL DATA:  Subsequent Treatment strategy for left breast cancer. Bone metastases. EXAM: NUCLEAR MEDICINE PET SKULL BASE TO THIGH TECHNIQUE: 13.0 mCi F-18 FDG was injected intravenously. Full-ring PET imaging was performed from the skull base to thigh after the radiotracer. CT data was obtained and used for attenuation correction and anatomic localization. FASTING BLOOD GLUCOSE:  Value: 130 mg/dl COMPARISON:  PET-CT 12/20/2015. Abdomen and pelvis CT from 03/05/2017. Chest CT 02/24/2017 FINDINGS: NECK No hypermetabolic lymph nodes in the neck. CHEST No hypermetabolic mediastinal or hilar nodes. No suspicious pulmonary nodules on the CT scan. There is mild diffuse FDG uptake in the left pectoralis minor muscle which is asymmetrically larger than the right. There is no focal lesion within the muscle and the uptake is diffuse, not focal. Emphysema noted in the lungs. Bibasilar collapse/consolidation evident, left greater than right. Small left pleural effusion noted. ABDOMEN/PELVIS No abnormal hypermetabolic activity within the liver, pancreas, adrenal glands, or spleen. No hypermetabolic lymph nodes in the abdomen or pelvis. Stomach and duodenum less distended than on the diagnostic CT of 02/24/2017. Small lymph nodes in the gastrohepatic ligament and para- aortic space are unchanged since 02/24/2017 and 12/20/2015. No hypermetabolism in these lymph nodes on today's study. Mild right hydronephrosis is stable. Trace free fluid is noted in the cul-de-sac. SKELETON No focal hypermetabolic activity to suggest skeletal metastasis. IMPRESSION: 1. Interval development of hypermetabolic metastatic lesion in the left aspect of the L3 vertebral body with apparent extension into the left epidural space on CT images. Lumbar spine MRI without with contrast could be used to confirm epidural tumor spread. 2. No other evidence for hypermetabolic metastatic disease in the  neck, chest, abdomen, or pelvis. 3. Diffuse FDG uptake identified in the left pectoralis minor muscle, indeterminate as there is no focal lesion within the muscle. Given the asymmetry, some component of muscle edema or inflammation may be present accounting for the asymmetric uptake. Electronically Signed   By: Misty Stanley M.D.   On: 03/07/2017 15:27   Ct Entero Abd/pelvis W Contast  Result Date: 03/06/2017 CLINICAL DATA:  Nausea and vomiting and abdominal distention for 1 week. Duodenal obstruction. 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 EXAM: CT ABDOMEN AND PELVIS WITH CONTRAST (ENTEROGRAPHY) TECHNIQUE: Multidetector CT of the abdomen and pelvis during bolus administration of intravenous contrast. Negative oral contrast was given. CONTRAST:  155mL ISOVUE-300 IOPAMIDOL (ISOVUE-300) INJECTION 61% COMPARISON:  03/04/2017 FINDINGS: Lower chest: No acute findings. Hepatobiliary: No masses identified. A few tiny  calcified gallstones are seen. No evidence of cholecystitis. Mild diffuse biliary ductal dilatation is again seen, with common bile duct measuring approximately 13 mm. A sub-cm high attenuation focus is seen in the area of the distal common bile duct and ampulla on image 33/2 which may represent a distal common bile duct stone. Pancreas:  No mass or inflammatory changes. Spleen:  Within normal limits in size and appearance. Adrenals/Urinary Tract: No masses identified. No evidence of hydronephrosis. Small amount of high attenuation material seen along the dependent gallbladder wall, which is noted on prior exam. This may be due to blood products or urinary tract infection. Stomach/Bowel: Nasogastric tube is seen in the stomach. Stomach and duodenum are dilated. There is a short segment area of wall thickening involving the fourth portion the duodenum, which measures 3.5 cm in length on image 36/2. This is suspicious for obstructing duodenal neoplasm. Remainder of small bowel and colon  are nondilated. No evidence of abnormal mucosal enhancement or mesenteric inflammatory changes. No evidence of enteric fistula, extraluminal gas or abnormal fluid collections. The terminal ileum is normal in appearance. Vascular/Lymphatic: Tiny sub-cm lymph nodes are seen in the porta hepatis, celiac axis, and upper left paraaortic region. No pathologically enlarged lymph nodes identified. No abdominal aortic aneurysm. Aortic atherosclerosis. Reproductive: Prior hysterectomy noted. Adnexal regions are unremarkable in appearance. Other:  None. Musculoskeletal:  No suspicious bone lesions identified. IMPRESSION: Duodenal obstruction with short segment wall thickening in the fourth portion of the duodenum, suspicious for duodenal neoplasm. Recommend endoscopy for further evaluation. Nonspecific sub-cm upper abdominal lymph nodes in the gastrohepatic ligament, porta hepatis, celiac axis, and upper left paraaortic region. Although these may be reactive in etiology, early lymph node metastases cannot excluded. Cholelithiasis. Mild biliary ductal dilatation, with possible small distal common bile duct calculus. Suggest correlation with liver function tests; consider further evaluation with abdomen MRI and MRCP without and with contrast if warranted clinically. Small amount high high attenuation material in the urinary bladder, which may be due to blood products or urinary tract infection. Recommend correlation with urinalysis. Electronically Signed   By: Earle Gell M.D.   On: 03/06/2017 08:31   Dg Chest Port 1 View  Result Date: 03/06/2017 CLINICAL DATA:  Central line placement, metastatic breast cancer EXAM: PORTABLE CHEST 1 VIEW COMPARISON:  Portable exam at 1653 hours compared to 03/04/2017 FINDINGS: Nasogastric tube extends into stomach. New RIGHT jugular central venous catheter with tip projecting over SVC. Normal heart size, mediastinal contours, and pulmonary vascularity. Atherosclerotic calcification aorta.  Emphysematous changes with LEFT basilar atelectasis and small pleural effusion. Upper lungs clear. No pneumothorax. Bones demineralized. IMPRESSION: No pneumothorax following RIGHT jugular line placement. Emphysematous changes with LEFT pleural effusion and basilar atelectasis. Aortic Atherosclerosis (ICD10-I70.0) and Emphysema (ICD10-J43.9). Electronically Signed   By: Lavonia Dana M.D.   On: 03/06/2017 17:06    Scheduled Meds: . Chlorhexidine Gluconate Cloth  6 each Topical Once   And  . Chlorhexidine Gluconate Cloth  6 each Topical Once  . enoxaparin (LOVENOX) injection  40 mg Subcutaneous Q24H  . fluticasone  2 spray Each Nare Daily  . insulin aspart  0-9 Units Subcutaneous Q6H  . letrozole  2.5 mg Oral Daily  . lidocaine  1 application Other Once  . loratadine  10 mg Oral Daily  . mometasone-formoterol  2 puff Inhalation BID  . montelukast  10 mg Oral QHS  . oxymetazoline  8 spray Left Nare BID  . palbociclib  125 mg Oral Q lunch  .  pantoprazole (PROTONIX) IV  40 mg Intravenous Q24H  . tiotropium  18 mcg Inhalation BH-q7a   Continuous Infusions: . Marland KitchenTPN (CLINIMIX-E) Adult 40 mL/hr at 03/06/17 1845  . Marland KitchenTPN (CLINIMIX-E) Adult    . [START ON 03/08/2017]  ceFAZolin (ANCEF) IV    . fat emulsion      Assessment/Plan:  1. Functional duodenal obstruction with gastric distention. NG tube Came out this morning. Patient was very hesitant on placing a back. Ordered IV Ativan and morphine and viscous lidocaine prior to nurse placing a back. Possible OR tomorrow.  Pharmacy consult for TPN.  2. Relative hypoglycemia. Patient on TPN and diabetic medications on hold 3. Hypokalemia. Will monitor while on TPN 4. Metastatic breast cancer L3 bone. On letrozole and palbociclib. Appreciate oncology consultation. Likely will end up needing an MRI of the lumbar spine at some point. 5. GERD on Protonix 6. COPD on Spiriva 7. Hold all nonessential medications 8. Stop antibiotics 9. Lipase normalized with  IV fluids.  Code Status:     Code Status Orders        Start     Ordered   03/04/17 0124  Full code  Continuous     03/04/17 0123    Code Status History    Date Active Date Inactive Code Status Order ID Comments User Context   02/24/2017 11:40 PM 02/26/2017  5:06 PM Full Code [id number]  Lance Coon, MD Inpatient     Family Communication: spoke with Son on the phone Disposition Plan: TBD  Consultants:  General surgery  Oncology  Gastroenterology  Time spent: 24 minutes  [place]

## 2017-03-07 NOTE — Progress Notes (Signed)
SURGICAL PROGRESS NOTE (cpt [phone])  Hospital Day(s): 3.   Post op day(s): 1 Day Post-Op.   Interval History: Patient seen and examined, removal of NG tube overnight per patient's request due to pain noted and discussed with patient and her RN. Patient reports no new abdominal pain, N/V, fever/chills, CP, or SOB with +flatus. After some discussion regarding patient's discomfort and the importance of her NG tube towards helping to decompress her stomach and intestine prior to her surgery tomorrow, patient expresses willingness to have NG tube re-inserted.  Review of Systems:  Constitutional: denies fever, chills  HEENT: denies cough or congestion  Respiratory: denies any shortness of breath  Cardiovascular: denies chest pain or palpitations  Gastrointestinal: abdominal pain, N/V, and bowel function as per interval history Genitourinary: denies burning with urination or urinary frequency Musculoskeletal: denies pain, decreased motor or sensation Integumentary: denies any other rashes or skin discolorations Neurological: denies HA or vision/hearing changes   Vital signs in last 24 hours: [min-max] current  Temp:  [96.9 F (36.1 C)-98.6 F (37 C)] 98 F (36.7 C) (07/12 0428) Pulse Rate:  [63-95] 63 (07/12 0858) Resp:  [10-20] 12 (07/12 0858) BP: (111-156)/(45-80) 136/47 (07/12 0858) SpO2:  [95 %-100 %] 100 % (07/12 0858) Weight:  [151 lb 11.2 oz (68.8 kg)] 151 lb 11.2 oz (68.8 kg) (07/12 0428)     Height: 5\' 2"  (157.5 cm) Weight: 151 lb 11.2 oz (68.8 kg) BMI (Calculated): 27.9   Intake/Output this shift:  No intake/output data recorded.   Intake/Output last 2 shifts:  @IOLAST2SHIFTS @   Physical Exam:  Constitutional: alert, cooperative and no distress  HENT: normocephalic without obvious abnormality  Eyes: PERRL, EOM's grossly intact and symmetric  Neuro: CN II - XII grossly intact and symmetric without deficit  Respiratory: breathing non-labored at rest  Cardiovascular:  regular rate and sinus rhythm  Gastrointestinal: soft, non-tender, and non-distended  Musculoskeletal: UE and LE FROM, no edema or wounds, motor and sensation grossly intact   Labs:  CBC Latest Ref Rng & Units 03/06/2017 03/04/2017 03/03/2017  WBC 3.6 - 11.0 K/uL 11.7(H) 10.6 12.8(H)  Hemoglobin 12.0 - 16.0 g/dL 9.2(L) 10.0(L) 10.6(L)  Hematocrit 35.0 - 47.0 % 28.6(L) 30.0(L) 31.1(L)  Platelets 150 - 440 K/uL 379 383 409   CMP Latest Ref Rng & Units 03/06/2017 03/06/2017 03/06/2017  Glucose 65 - 99 mg/dL - 149(H) 95  BUN 6 - 20 mg/dL - 9 10  Creatinine 0.44 - 1.00 mg/dL - 0.71 0.82  Sodium 135 - 145 mmol/L - 138 139  Potassium 3.5 - 5.1 mmol/L - 4.2 3.2(L)  Chloride 101 - 111 mmol/L - 103 104  CO2 22 - 32 mmol/L - 30 29  Calcium 8.9 - 10.3 mg/dL - 8.8(L) 8.6(L)  Total Protein 6.5 - 8.1 g/dL 6.6 - -  Total Bilirubin 0.3 - 1.2 mg/dL 0.7 - -  Alkaline Phos 38 - 126 U/L 67 - -  AST 15 - 41 U/L 21 - -  ALT 14 - 54 U/L 12(L) - -   Imaging studies:  PET CT from Skull to Thigh (03/07/2017) 1. Interval development of hypermetabolic metastatic lesion in the left aspect of the L3 vertebral body with apparent extension into the left epidural space on CT images. Lumbar spine MRI without with contrast could be used to confirm epidural tumor spread. 2. No other evidence for hypermetabolic metastatic disease in the neck, chest, abdomen, or pelvis. 3. Diffuse FDG uptake identified in the left pectoralis minor muscle, indeterminate as  there is no focal lesion within the muscle. Given the asymmetry, some component of muscle edema or inflammation may be present accounting for the asymmetric uptake.  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  - nasogastric decompression - TPN and correct electrolytes prn - patient discussed today at tumor board, consensus is for G-J bypass tomorrow - if a mass is appreciated and safely amenable to biopsy during surgery, will biopsy  - all risks, benefits, and alternatives to above elective procedure(s) were discussed with the patient and her son + other family, all of their questions were answered to their expressed satisfaction, patient expresses she wishes to proceed, and informed consent was obtained.             - if biopsy specimen not obtained tomorrow, consider subsequent EUS - medical optimization and management of comorbidities - DVT prophylaxis  All of the above findings and recommendations were discussed with the patient, patient's family, and the medical team, and all of patient's and family's questions were answered to their expressed satisfaction.  Thank you for the opportunity to participate in this patient's care.  -- [name] Rosana Hoes, MD, [place]: [facility] - Partnering for exceptional care. Office: [phone]

## 2017-03-07 NOTE — Progress Notes (Signed)
Patient given pre meds about 30 minutes ago.  NG tube inserted without difficulty.  Patient was relaxed and pleased that it went well.  X-ray ordered for confirmation

## 2017-03-07 NOTE — Progress Notes (Signed)
Patient is c/o sever pain from NG tube.  She is holding it so that it doesn't move at all.  She whimpers when it is moved.  Tape removed.  A very small ulcer noted on cartlidge.  NG tube removed because of the sever pain.  Her nose bleed after the tube was removed.  She is very reluctant to have another one place.  Dr. Earleen Newport agreed she could hold off until the afternoon.  He ordered pre meds of ativan 0.5  Mg IV, and 1 mg of MS IV. Plus lidocain topical jelly.

## 2017-03-08 ENCOUNTER — Encounter
Admission: EM | Disposition: A | Discharge status: Home Health | Payer: Self-pay | Source: Home / Self Care | Attending: Internal Medicine

## 2017-03-08 ENCOUNTER — Inpatient Hospital Stay: Payer: Medicare HMO | Admitting: Anesthesiology

## 2017-03-08 DIAGNOSIS — K315 Obstruction of duodenum: Secondary | ICD-10-CM

## 2017-03-08 DIAGNOSIS — C50911 Malignant neoplasm of unspecified site of right female breast: Secondary | ICD-10-CM

## 2017-03-08 DIAGNOSIS — C50919 Malignant neoplasm of unspecified site of unspecified female breast: Secondary | ICD-10-CM

## 2017-03-08 DIAGNOSIS — C7951 Secondary malignant neoplasm of bone: Secondary | ICD-10-CM

## 2017-03-08 HISTORY — PX: GASTROJEJUNOSTOMY: SHX1697

## 2017-03-08 LAB — GLUCOSE, CAPILLARY
GLUCOSE-CAPILLARY: 123 mg/dL — AB (ref 65–99)
GLUCOSE-CAPILLARY: 142 mg/dL — AB (ref 65–99)
GLUCOSE-CAPILLARY: 239 mg/dL — AB (ref 65–99)
GLUCOSE-CAPILLARY: 163 mg/dL — AB (ref 65–99)
Glucose-Capillary: 173 mg/dL — ABNORMAL HIGH (ref 65–99)
Glucose-Capillary: 126 mg/dL — ABNORMAL HIGH (ref 65–99)
Glucose-Capillary: 209 mg/dL — ABNORMAL HIGH (ref 65–99)
Glucose-Capillary: 209 mg/dL — ABNORMAL HIGH (ref 65–99)

## 2017-03-08 LAB — BASIC METABOLIC PANEL
Anion gap: 7 (ref 5–15)
BUN: 13 mg/dL (ref 6–20)
CHLORIDE: 102 mmol/L (ref 101–111)
CO2: 31 mmol/L (ref 22–32)
CREATININE: 0.66 mg/dL (ref 0.44–1.00)
Calcium: 8.9 mg/dL (ref 8.9–10.3)
GFR calc non Af Amer: >60 mL/min (ref >60)
GLUCOSE: 135 mg/dL — AB (ref 65–99)
Potassium: 3.5 mmol/L (ref 3.5–5.1)
Sodium: 140 mmol/L (ref 135–145)

## 2017-03-08 LAB — MAGNESIUM: Magnesium: 1.6 mg/dL — ABNORMAL LOW (ref 1.7–2.4)

## 2017-03-08 LAB — PHOSPHORUS: PHOSPHORUS: 4.1 mg/dL (ref 2.5–4.6)

## 2017-03-08 SURGERY — GASTROJEJUNOSTOMY
Anesthesia: General

## 2017-03-08 MED ORDER — FENTANYL CITRATE (PF) 100 MCG/2ML IJ SOLN
INTRAMUSCULAR | Status: DC | PRN
  Administered 2017-03-08: 50 ug via INTRAVENOUS
  Administered 2017-03-08: 100 ug via INTRAVENOUS

## 2017-03-08 MED ORDER — SUCCINYLCHOLINE CHLORIDE 20 MG/ML IJ SOLN
INTRAMUSCULAR | Status: DC | PRN
  Administered 2017-03-08: 100 mg via INTRAVENOUS

## 2017-03-08 MED ORDER — MAGNESIUM SULFATE 2 GM/50ML IV SOLN
2.0000 g | Freq: Once | INTRAVENOUS | Status: AC
  Administered 2017-03-08: 10:00:00 2 g via INTRAVENOUS
  Filled 2017-03-08: qty 50

## 2017-03-08 MED ORDER — FAT EMULSION 20 % IV EMUL: 250.0000 mL | INTRAVENOUS | Status: DC

## 2017-03-08 MED ORDER — BUPIVACAINE HCL (PF) 0.5 % IJ SOLN
INTRAMUSCULAR | Status: AC
  Filled 2017-03-08: qty 30

## 2017-03-08 MED ORDER — SEVOFLURANE IN SOLN
RESPIRATORY_TRACT | Status: AC
  Filled 2017-03-08: qty 250

## 2017-03-08 MED ORDER — PROPOFOL 10 MG/ML IV BOLUS
INTRAVENOUS | Status: AC
  Filled 2017-03-08: qty 20

## 2017-03-08 MED ORDER — CEFAZOLIN SODIUM-DEXTROSE 2-4 GM/100ML-% IV SOLN
2.0000 g | Freq: Three times a day (TID) | INTRAVENOUS | Status: AC
  Administered 2017-03-08 – 2017-03-09 (×2): 2 g via INTRAVENOUS
  Filled 2017-03-08 (×2): qty 100

## 2017-03-08 MED ORDER — KETOROLAC TROMETHAMINE 15 MG/ML IJ SOLN
15.0000 mg | Freq: Four times a day (QID) | INTRAMUSCULAR | Status: AC
  Administered 2017-03-08: 15 mg via INTRAVENOUS
  Filled 2017-03-08: qty 1

## 2017-03-08 MED ORDER — SUGAMMADEX SODIUM 200 MG/2ML IV SOLN
INTRAVENOUS | Status: DC | PRN
  Administered 2017-03-08: 140 mg via INTRAVENOUS

## 2017-03-08 MED ORDER — SUGAMMADEX SODIUM 500 MG/5ML IV SOLN
INTRAVENOUS | Status: AC
  Filled 2017-03-08: qty 5

## 2017-03-08 MED ORDER — ROCURONIUM BROMIDE 100 MG/10ML IV SOLN
INTRAVENOUS | Status: DC | PRN
  Administered 2017-03-08: 40 mg via INTRAVENOUS
  Administered 2017-03-08: 20 mg via INTRAVENOUS

## 2017-03-08 MED ORDER — FENTANYL CITRATE (PF) 250 MCG/5ML IJ SOLN
INTRAMUSCULAR | Status: AC
  Filled 2017-03-08: qty 5

## 2017-03-08 MED ORDER — TRACE MINERALS CR-CU-MN-SE-ZN 10-1000-500-60 MCG/ML IV SOLN: INTRAVENOUS | Status: DC

## 2017-03-08 MED ORDER — INSULIN ASPART 100 UNIT/ML ~~LOC~~ SOLN
4.0000 [IU] | Freq: Once | SUBCUTANEOUS | Status: AC
  Administered 2017-03-08: 4 [IU] via SUBCUTANEOUS

## 2017-03-08 MED ORDER — LIDOCAINE HCL (PF) 2 % IJ SOLN
INTRAMUSCULAR | Status: AC
  Filled 2017-03-08: qty 2

## 2017-03-08 MED ORDER — INSULIN ASPART 100 UNIT/ML ~~LOC~~ SOLN
SUBCUTANEOUS | Status: AC
  Filled 2017-03-08: qty 1

## 2017-03-08 MED ORDER — BUPIVACAINE LIPOSOME 1.3 % IJ SUSP
INTRAMUSCULAR | Status: AC
  Filled 2017-03-08: qty 20

## 2017-03-08 MED ORDER — MIDAZOLAM HCL 2 MG/2ML IJ SOLN
INTRAMUSCULAR | Status: AC
  Filled 2017-03-08: qty 2

## 2017-03-08 MED ORDER — CEFAZOLIN SODIUM-DEXTROSE 2-4 GM/100ML-% IV SOLN
INTRAVENOUS | Status: AC
  Filled 2017-03-08: qty 100

## 2017-03-08 MED ORDER — KETOROLAC TROMETHAMINE 30 MG/ML IJ SOLN
15.0000 mg | Freq: Four times a day (QID) | INTRAMUSCULAR | Status: DC | PRN
  Administered 2017-03-09 – 2017-03-11 (×5): 15 mg via INTRAVENOUS
  Filled 2017-03-08 (×6): qty 1

## 2017-03-08 MED ORDER — ROCURONIUM BROMIDE 50 MG/5ML IV SOLN
INTRAVENOUS | Status: AC
  Filled 2017-03-08: qty 1

## 2017-03-08 MED ORDER — ONDANSETRON HCL 4 MG/2ML IJ SOLN
INTRAMUSCULAR | Status: AC
  Filled 2017-03-08: qty 2

## 2017-03-08 MED ORDER — SODIUM CHLORIDE 0.9 % IV SOLN
INTRAVENOUS | Status: DC | PRN
  Administered 2017-03-08: 25 ug/min via INTRAVENOUS

## 2017-03-08 MED ORDER — LACTATED RINGERS IV SOLN
INTRAVENOUS | Status: DC | PRN
  Administered 2017-03-08: 13:00:00 via INTRAVENOUS

## 2017-03-08 MED ORDER — PHENYLEPHRINE HCL 10 MG/ML IJ SOLN
INTRAMUSCULAR | Status: DC | PRN
  Administered 2017-03-08 (×6): 100 ug via INTRAVENOUS

## 2017-03-08 MED ORDER — MIDAZOLAM HCL 2 MG/2ML IJ SOLN
INTRAMUSCULAR | Status: DC | PRN
  Administered 2017-03-08 (×2): 1 mg via INTRAVENOUS

## 2017-03-08 MED ORDER — INSULIN ASPART 100 UNIT/ML ~~LOC~~ SOLN
0.0000 [IU] | SUBCUTANEOUS | Status: DC
  Administered 2017-03-08 (×2): 1 [IU] via SUBCUTANEOUS
  Administered 2017-03-08 – 2017-03-09 (×3): 2 [IU] via SUBCUTANEOUS
  Administered 2017-03-09: 3 [IU] via SUBCUTANEOUS
  Administered 2017-03-09 (×3): 2 [IU] via SUBCUTANEOUS
  Administered 2017-03-10: 21:00:00 1 [IU] via SUBCUTANEOUS
  Administered 2017-03-10: 2 [IU] via SUBCUTANEOUS
  Administered 2017-03-10 (×2): 3 [IU] via SUBCUTANEOUS
  Administered 2017-03-10 – 2017-03-11 (×3): 2 [IU] via SUBCUTANEOUS
  Administered 2017-03-11: 3 [IU] via SUBCUTANEOUS
  Administered 2017-03-11: 2 [IU] via SUBCUTANEOUS
  Filled 2017-03-08 (×18): qty 1

## 2017-03-08 MED ORDER — CEFAZOLIN SODIUM-DEXTROSE 2-3 GM-% IV SOLR
INTRAVENOUS | Status: DC | PRN
  Administered 2017-03-08: 2 g via INTRAVENOUS

## 2017-03-08 MED ORDER — MORPHINE SULFATE (PF) 2 MG/ML IV SOLN
2.0000 mg | INTRAVENOUS | Status: DC | PRN
  Administered 2017-03-08 – 2017-03-12 (×10): 2 mg via INTRAVENOUS
  Filled 2017-03-08 (×10): qty 1

## 2017-03-08 MED ORDER — BUPIVACAINE HCL (PF) 0.5 % IJ SOLN
INTRAMUSCULAR | Status: DC | PRN
  Administered 2017-03-08: 30 mL

## 2017-03-08 MED ORDER — ONDANSETRON HCL 4 MG/2ML IJ SOLN
INTRAMUSCULAR | Status: DC | PRN
  Administered 2017-03-08: 4 mg via INTRAVENOUS

## 2017-03-08 MED ORDER — ENOXAPARIN SODIUM 40 MG/0.4ML ~~LOC~~ SOLN
40.0000 mg | SUBCUTANEOUS | Status: DC
  Administered 2017-03-09 – 2017-03-13 (×5): 40 mg via SUBCUTANEOUS
  Filled 2017-03-08 (×5): qty 0.4

## 2017-03-08 MED ORDER — LIDOCAINE HCL (CARDIAC) 20 MG/ML IV SOLN
INTRAVENOUS | Status: DC | PRN
  Administered 2017-03-08: 100 mg via INTRAVENOUS
  Administered 2017-03-08 (×2): 50 mg via INTRAVENOUS
  Administered 2017-03-08: 100 mg via INTRAVENOUS

## 2017-03-08 MED ORDER — PROPOFOL 10 MG/ML IV BOLUS
INTRAVENOUS | Status: DC | PRN
  Administered 2017-03-08: 150 mg via INTRAVENOUS

## 2017-03-08 MED ORDER — ONDANSETRON HCL 4 MG/2ML IJ SOLN: 4.0000 mg | Freq: Once | INTRAMUSCULAR | Status: DC | PRN

## 2017-03-08 MED ORDER — FENTANYL CITRATE (PF) 100 MCG/2ML IJ SOLN
INTRAMUSCULAR | Status: AC
Start: 2017-03-08 — End: 2017-03-09
  Filled 2017-03-08: qty 2

## 2017-03-08 MED ORDER — TRACE MINERALS CR-CU-MN-SE-ZN 10-1000-500-60 MCG/ML IV SOLN
INTRAVENOUS | Status: AC
  Administered 2017-03-08: 18:00:00 via INTRAVENOUS
  Filled 2017-03-08: qty 1920

## 2017-03-08 MED ORDER — FENTANYL CITRATE (PF) 100 MCG/2ML IJ SOLN
25.0000 ug | INTRAMUSCULAR | Status: DC | PRN
  Administered 2017-03-08 (×4): 25 ug via INTRAVENOUS

## 2017-03-08 MED ORDER — FAT EMULSION 20 % IV EMUL
250.0000 mL | INTRAVENOUS | Status: AC
  Administered 2017-03-08: 18:00:00 250 mL via INTRAVENOUS
  Filled 2017-03-08: qty 250

## 2017-03-08 MED ORDER — SUCCINYLCHOLINE CHLORIDE 20 MG/ML IJ SOLN
INTRAMUSCULAR | Status: AC
  Filled 2017-03-08: qty 1

## 2017-03-08 SURGICAL SUPPLY — 60 items
APPLIER CLIP 11 MED OPEN (CLIP)
APPLIER CLIP 13 LRG OPEN (CLIP)
BAG BILE T-TUBES STRL (MISCELLANEOUS) IMPLANT
BAG HAMPER (MISCELLANEOUS) IMPLANT
BARRIER SKIN 2 3/4 (OSTOMY) IMPLANT
BARRIER SKIN 2 3/4 INCH (OSTOMY)
CHLORAPREP W/TINT 26ML (MISCELLANEOUS) ×3 IMPLANT
CLAMP POUCH DRAINAGE QUIET (OSTOMY) IMPLANT
CLIP APPLIE 11 MED OPEN (CLIP) IMPLANT
CLIP APPLIE 13 LRG OPEN (CLIP) IMPLANT
CNTNR SPEC 2.5X3XGRAD LEK (MISCELLANEOUS) ×1
CONT SPEC 4OZ STER OR WHT (MISCELLANEOUS) ×2
CONTAINER SPEC 2.5X3XGRAD LEK (MISCELLANEOUS) ×1 IMPLANT
DRAPE LAPAROTOMY T 102X78X121 (DRAPES) ×3 IMPLANT
DRSG OPSITE POSTOP 4X10 (GAUZE/BANDAGES/DRESSINGS) ×3 IMPLANT
DURAPREP 26ML APPLICATOR (WOUND CARE) IMPLANT
ELECT BLADE 6 FLAT ULTRCLN (ELECTRODE) IMPLANT
ELECT BLADE 6.5 EXT (BLADE) ×3 IMPLANT
ELECT CAUTERY BLADE 6.4 (BLADE) ×3 IMPLANT
ELECT REM PT RETURN 9FT ADLT (ELECTROSURGICAL) ×3
ELECTRODE REM PT RTRN 9FT ADLT (ELECTROSURGICAL) ×1 IMPLANT
GAUZE SPONGE 4X4 12PLY STRL (GAUZE/BANDAGES/DRESSINGS) IMPLANT
GLOVE BIOGEL PI IND STRL 7.5 (GLOVE) ×1 IMPLANT
GLOVE BIOGEL PI INDICATOR 7.5 (GLOVE) ×2
GLOVE ECLIPSE 7.0 STRL STRAW (GLOVE) ×3 IMPLANT
GOWN STRL REUS W/TWL LRG LVL3 (GOWN DISPOSABLE) IMPLANT
HANDLE SUCTION POOLE (INSTRUMENTS) IMPLANT
KIT RM TURNOVER STRD PROC AR (KITS) ×3 IMPLANT
LIGASURE IMPACT 36 18CM CVD LR (INSTRUMENTS) ×3 IMPLANT
NEEDLE HYPO 22GX1.5 SAFETY (NEEDLE) ×6 IMPLANT
NS IRRIG 1000ML POUR BTL (IV SOLUTION) ×3 IMPLANT
PACK BASIN MAJOR ARMC (MISCELLANEOUS) ×3 IMPLANT
PACK COLON CLEAN CLOSURE (MISCELLANEOUS) ×3 IMPLANT
RELOAD LINEAR CUT PROX 55 BLUE (ENDOMECHANICALS) IMPLANT
RELOAD PROXIMATE 75MM BLUE (ENDOMECHANICALS) ×24 IMPLANT
RETRACTOR WND ALEXIS-O 25 LRG (MISCELLANEOUS) IMPLANT
RETRACTOR WOUND ALXS 18CM MED (MISCELLANEOUS) IMPLANT
RTRCTR WOUND ALEXIS O 18CM MED (MISCELLANEOUS)
RTRCTR WOUND ALEXIS O 25CM LRG (MISCELLANEOUS)
SPONGE LAP 18X18 5 PK (GAUZE/BANDAGES/DRESSINGS) ×3 IMPLANT
SPONGE XRAY 4X4 16PLY STRL (MISCELLANEOUS) ×3 IMPLANT
STAPLER GUN LINEAR PROX 60 (STAPLE) IMPLANT
STAPLER PROXIMATE 55 BLUE (STAPLE) IMPLANT
STAPLER PROXIMATE 75MM BLUE (STAPLE) ×3 IMPLANT
STAPLER SKIN PROX 35W (STAPLE) ×3 IMPLANT
SUCTION POOLE HANDLE (INSTRUMENTS)
SUT CHROMIC 0 SH (SUTURE) IMPLANT
SUT CHROMIC 2 0 SH (SUTURE) IMPLANT
SUT PDS #1 CTX NDL (SUTURE) IMPLANT
SUT PDS AB 0 CT1 27 (SUTURE) IMPLANT
SUT PDS AB 1 TP1 96 (SUTURE) ×6 IMPLANT
SUT SILK 2 0 (SUTURE) ×2
SUT SILK 2-0 18XBRD TIE 12 (SUTURE) ×1 IMPLANT
SUT SILK 3-0 (SUTURE) ×2
SUT SILK 3-0 SH-1 18XCR BRD (SUTURE) ×1
SUT VIC AB 3-0 SH 27 (SUTURE) ×8
SUT VIC AB 3-0 SH 27X BRD (SUTURE) ×4 IMPLANT
SUTURE SILK 3-0 SH-1 18XCR BRD (SUTURE) ×1 IMPLANT
SYR 30ML LL (SYRINGE) ×6 IMPLANT
TRAY FOLEY CATH SILVER 16FR LF (SET/KITS/TRAYS/PACK) ×3 IMPLANT

## 2017-03-08 NOTE — Anesthesia Preprocedure Evaluation (Signed)
Anesthesia Evaluation  Patient identified by MRN, date of birth, ID band Patient awake    Reviewed: Allergy & Precautions, NPO status , Patient's Chart, lab work & pertinent test results  History of Anesthesia Complications Negative for: history of anesthetic complications  Airway Mallampati: III  TM Distance: >3 FB Neck ROM: Full    Dental  (+) Edentulous Upper, Poor Dentition   Pulmonary shortness of breath, asthma , COPD (wears 3L 02 continuously at home),  oxygen dependent, Current Smoker,    breath sounds clear to auscultation- rhonchi (-) wheezing      Cardiovascular hypertension, Pt. on medications (-) CAD, (-) Past MI and (-) Cardiac Stents  Rhythm:Regular Rate:Normal - Systolic murmurs and - Diastolic murmurs    Neuro/Psych  Headaches, negative psych ROS   GI/Hepatic Neg liver ROS, GERD  ,SBO    Endo/Other  diabetes, Type 2, Oral Hypoglycemic Agents  Renal/GU Renal disease     Musculoskeletal  (+) Arthritis ,   Abdominal (+) - obese,   Peds  Hematology  (+) anemia ,   Anesthesia Other Findings Past Medical History: No date: Anemia No date: Arthritis No date: Asthma 2009: Breast cancer ([place])     Comment: RT MASTECTOMY, DCIS 08/09/2015: Cancer of left breast ([facility])     Comment: T4 N1 M1 tumor, INVASIVE LOBULAR CARCINOMA. No date: COPD (chronic obstructive pulmonary disease) (* No date: Diabetes mellitus without complication (HCC) No date: GERD (gastroesophageal reflux disease) No date: Headache     Comment: h/o migraines as a child No date: Hypertension 2015: Pneumonia No date: Shortness of breath dyspnea     Comment: with exertion   Reproductive/Obstetrics                             Anesthesia Physical  Anesthesia Plan  ASA: III  Anesthesia Plan: General   Post-op Pain Management:    Induction: Intravenous  PONV Risk Score and Plan: 1 and Ondansetron and  Propofol  Airway Management Planned: Oral ETT  Additional Equipment:   Intra-op Plan:   Post-operative Plan: Extubation in OR  Informed Consent: I have reviewed the patients History and Physical, chart, labs and discussed the procedure including the risks, benefits and alternatives for the proposed anesthesia with the patient or authorized representative who has indicated his/her understanding and acceptance.   Dental advisory given  Plan Discussed with: CRNA and Anesthesiologist  Anesthesia Plan Comments:         Anesthesia Quick Evaluation

## 2017-03-08 NOTE — Anesthesia Post-op Follow-up Note (Cosign Needed)
Anesthesia QCDR form completed.        

## 2017-03-08 NOTE — Anesthesia Postprocedure Evaluation (Signed)
Anesthesia Post Note  Patient: Sophia Nelson  Procedure(s) Performed: Procedure(s) (LRB): Roux-en-Y gastrojejunostomy Bypass of Gastric Outlet Obstruction, small bowel resection (N/A)  Patient location during evaluation: PACU Anesthesia Type: General Level of consciousness: awake and alert Pain management: pain level controlled Vital Signs Assessment: post-procedure vital signs reviewed and stable Respiratory status: spontaneous breathing, nonlabored ventilation, respiratory function stable and patient connected to nasal cannula oxygen Cardiovascular status: blood pressure returned to baseline and stable Postop Assessment: no signs of nausea or vomiting Anesthetic complications: no     Last Vitals:  Vitals:   03/08/17 1726 03/08/17 1730  BP: (!) 97/42 (!) 104/46  Pulse: 84 96  Resp: 13 18  Temp:      Last Pain:  Vitals:   03/08/17 1730  TempSrc:   PainSc: 4                  Precious Haws Thora Scherman

## 2017-03-08 NOTE — Progress Notes (Signed)
Patient ID: Sophia Nelson, female   DOB: 1946/01/04, 71 y.o.   MRN: [id number]    [facility] PROGRESS NOTE  Sophia Nelson DXA:[id number] DOB: 26-Nov-1945 DOA: 03/03/2017 PCP: Sophia Coffin, MD  HPI/Subjective: Patient still with abdominal soreness. NG tube now and the other nostril and not having as much discomfort in her nose.  Objective: Vitals:   03/07/17 1936 03/08/17 0426  BP: (!) 143/58 (!) 137/46  Pulse: 70 75  Resp:    Temp: 98.6 F (37 C) 98.4 F (36.9 C)    Filed Weights   03/04/17 0100 03/07/17 0428 03/08/17 0426  Weight: 69 kg (152 lb 3.2 oz) 68.8 kg (151 lb 11.2 oz) 68.4 kg (150 lb 11.2 oz)    ROS: Review of Systems  Constitutional: Negative for chills and fever.  Eyes: Negative for blurred vision.  Respiratory: Negative for cough and shortness of breath.   Cardiovascular: Negative for chest pain.  Gastrointestinal: Positive for abdominal pain. Negative for diarrhea, nausea and vomiting.  Genitourinary: Negative for dysuria.  Musculoskeletal: Negative for joint pain.  Neurological: Negative for dizziness and headaches.   Exam: Physical Exam  Constitutional: She is oriented to person, place, and time.  HENT:  Nose: No mucosal edema.  Mouth/Throat: No oropharyngeal exudate or posterior oropharyngeal edema.  Eyes: Pupils are equal, round, and reactive to light. Conjunctivae, EOM and lids are normal.  Neck: No JVD present. Carotid bruit is not present. No edema present. No thyroid mass and no thyromegaly present.  Cardiovascular: S1 normal and S2 normal.  Exam reveals no gallop.   No murmur heard. Pulses:      Dorsalis pedis pulses are 2+ on the right side, and 2+ on the left side.  Respiratory: No respiratory distress. She has decreased breath sounds in the left lower field. She has no wheezes. She has no rhonchi. She has no rales.  GI: Soft. She exhibits distension. Bowel sounds are decreased. There is tenderness.  Musculoskeletal:       Right  ankle: She exhibits swelling.       Left ankle: She exhibits swelling.  Lymphadenopathy:    She has no cervical adenopathy.  Neurological: She is alert and oriented to person, place, and time. No cranial nerve deficit.  Skin: Skin is warm. No rash noted. Nails show no clubbing.  Psychiatric: She has a normal mood and affect.      Data Reviewed: Basic Metabolic Panel:  Recent Labs Lab 03/03/17 2140 03/04/17 0503 03/05/17 0520 03/06/17 0334 03/06/17 0500 03/06/17 1808 03/08/17 0532  NA 137 137 136 139 138  --  140  K 4.0 3.2* 3.5 3.2* 4.2  --  3.5  CL 91* 93* 100* 104 103  --  102  CO2 32 35* 28 29 30   --  31  GLUCOSE 133* 127* 72 95 149*  --  135*  BUN 45* 41* 22* 10 9  --  13  CREATININE 1.49* 1.19* 0.82 0.82 0.71  --  0.66  CALCIUM 9.0 8.7* 8.5* 8.6* 8.8*  --  8.9  MG 2.5*  --  2.3  --  1.7  --  1.6*  PHOS 3.6  --   --   --  2.5 2.6 4.1   Liver Function Tests:  Recent Labs Lab 03/03/17 2140 03/06/17 1808  AST 23 21  ALT 11* 12*  ALKPHOS 70 67  BILITOT 1.0 0.7  PROT 6.7 6.6  ALBUMIN 3.4* 3.1*    Recent Labs Lab 03/03/17 2140  03/04/17 0503 03/05/17 0520  LIPASE 146* 111* 35   CBC:  Recent Labs Lab 03/03/17 2027 03/04/17 0503 03/06/17 1808  WBC 12.8* 10.6 11.7*  HGB 10.6* 10.0* 9.2*  HCT 31.1* 30.0* 28.6*  MCV 80.6 82.6 83.1  PLT 409 383 379   Cardiac Enzymes:  Recent Labs Lab 03/03/17 2140 03/04/17 0503 03/04/17 1043 03/04/17 1826  TROPONINI <0.03 <0.03 <0.03 <0.03   BNP (last 3 results)  Recent Labs  02/24/17 1952  BNP 9.0     CBG:  Recent Labs Lab 03/07/17 1936 03/07/17 2350 03/08/17 0110 03/08/17 0426 03/08/17 0732  GLUCAP 156* 164* 173* 126* 123*    Recent Results (from the past 240 hour(s))  Urine Culture     Status: Abnormal   Collection Time: 03/05/17  9:50 AM  Result Value Ref Range Status   Specimen Description URINE, CLEAN CATCH  Final   Special Requests NONE  Final   Culture (A)  Final    <10,000  COLONIES/mL INSIGNIFICANT GROWTH Performed at [facility], [place] [address], [place], [place] [address]    Report Status 03/06/2017 FINAL  Final     Studies: Dg Abd 1 View  Result Date: 03/07/2017 CLINICAL DATA:  Nasogastric tube placement. EXAM: ABDOMEN - 1 VIEW COMPARISON:  PET- CT March 07, 2017 at 1400 hours FINDINGS: Nasogastric tube tip and side-port project in proximal stomach. Bowel gas pattern is nondilated and nonobstructive. No intra-abdominal mass effect or pathologic calcifications. Patient's known L3 metastasis difficult to identify by plain radiography. IMPRESSION: Nasogastric tube tip projects in proximal stomach. Electronically Signed   By: Elon Alas M.D.   On: 03/07/2017 17:57   Nm Pet Image Restag (ps) Skull Base To Thigh  Result Date: 03/07/2017 CLINICAL DATA:  Subsequent Treatment strategy for left breast cancer. Bone metastases. EXAM: NUCLEAR MEDICINE PET SKULL BASE TO THIGH TECHNIQUE: 13.0 mCi F-18 FDG was injected intravenously. Full-ring PET imaging was performed from the skull base to thigh after the radiotracer. CT data was obtained and used for attenuation correction and anatomic localization. FASTING BLOOD GLUCOSE:  Value: 130 mg/dl COMPARISON:  PET-CT 12/20/2015. Abdomen and pelvis CT from 03/05/2017. Chest CT 02/24/2017 FINDINGS: NECK No hypermetabolic lymph nodes in the neck. CHEST No hypermetabolic mediastinal or hilar nodes. No suspicious pulmonary nodules on the CT scan. There is mild diffuse FDG uptake in the left pectoralis minor muscle which is asymmetrically larger than the right. There is no focal lesion within the muscle and the uptake is diffuse, not focal. Emphysema noted in the lungs. Bibasilar collapse/consolidation evident, left greater than right. Small left pleural effusion noted. ABDOMEN/PELVIS No abnormal hypermetabolic activity within the liver, pancreas, adrenal glands, or spleen. No hypermetabolic lymph nodes in the abdomen or pelvis.  Stomach and duodenum less distended than on the diagnostic CT of 02/24/2017. Small lymph nodes in the gastrohepatic ligament and para- aortic space are unchanged since 02/24/2017 and 12/20/2015. No hypermetabolism in these lymph nodes on today's study. Mild right hydronephrosis is stable. Trace free fluid is noted in the cul-de-sac. SKELETON No focal hypermetabolic activity to suggest skeletal metastasis. IMPRESSION: 1. Interval development of hypermetabolic metastatic lesion in the left aspect of the L3 vertebral body with apparent extension into the left epidural space on CT images. Lumbar spine MRI without with contrast could be used to confirm epidural tumor spread. 2. No other evidence for hypermetabolic metastatic disease in the neck, chest, abdomen, or pelvis. 3. Diffuse FDG uptake identified in the left pectoralis minor muscle,  indeterminate as there is no focal lesion within the muscle. Given the asymmetry, some component of muscle edema or inflammation may be present accounting for the asymmetric uptake. Electronically Signed   By: Misty Stanley M.D.   On: 03/07/2017 15:27   Dg Chest Port 1 View  Result Date: 03/06/2017 CLINICAL DATA:  Central line placement, metastatic breast cancer EXAM: PORTABLE CHEST 1 VIEW COMPARISON:  Portable exam at 1653 hours compared to 03/04/2017 FINDINGS: Nasogastric tube extends into stomach. New RIGHT jugular central venous catheter with tip projecting over SVC. Normal heart size, mediastinal contours, and pulmonary vascularity. Atherosclerotic calcification aorta. Emphysematous changes with LEFT basilar atelectasis and small pleural effusion. Upper lungs clear. No pneumothorax. Bones demineralized. IMPRESSION: No pneumothorax following RIGHT jugular line placement. Emphysematous changes with LEFT pleural effusion and basilar atelectasis. Aortic Atherosclerosis (ICD10-I70.0) and Emphysema (ICD10-J43.9). Electronically Signed   By: Lavonia Dana M.D.   On: 03/06/2017 17:06     Scheduled Meds: . enoxaparin (LOVENOX) injection  40 mg Subcutaneous Q24H  . insulin aspart  0-9 Units Subcutaneous Q4H  . letrozole  2.5 mg Oral Daily  . loratadine  10 mg Oral Daily  . mometasone-formoterol  2 puff Inhalation BID  . montelukast  10 mg Oral QHS  . palbociclib  125 mg Oral Q lunch  . pantoprazole (PROTONIX) IV  40 mg Intravenous Q24H  . tiotropium  18 mcg Inhalation BH-q7a   Continuous Infusions: . Marland KitchenTPN (CLINIMIX-E) Adult 80 mL/hr at 03/07/17 1803  .  ceFAZolin (ANCEF) IV    . magnesium sulfate 1 - 4 g bolus IVPB      Assessment/Plan:  1. Functional duodenal obstruction with gastric distention. NG tube To suction. TPN for nutrition. Patient to go to the operating room today at 11 AM. Surgery must be done so the patient can eat and survive. No contraindications to surgery at this time. 2. Relative hypoglycemia resolved with using TPN and diabetic medications on hold. 3. Hypokalemia. Should be able to correct with TPN 4. Hypomagnesemia IV magnesium today 5. Metastatic breast cancer L3 bone. On letrozole and palbociclib.  Case discussed with oncology. The patient has a history of metastatic breast cancer. Further workup after surgery. 6. GERD on Protonix 7. COPD on Spiriva 8. Hold all nonessential medications  Code Status:     Code Status Orders        Start     Ordered   03/04/17 0124  Full code  Continuous     03/04/17 0123    Code Status History    Date Active Date Inactive Code Status Order ID Comments User Context   02/24/2017 11:40 PM 02/26/2017  5:06 PM Full Code [id number]  Lance Coon, MD Inpatient     Family Communication: spoke with Son on the phone yesterday evening Disposition Plan: TBD  Consultants:  General surgery  Oncology  Gastroenterology  Time spent: 22 minutes  [place], [facility]

## 2017-03-08 NOTE — Progress Notes (Signed)
PHARMACY - ADULT TOTAL PARENTERAL NUTRITION CONSULT NOTE   Pharmacy Consult for TPN  Indication: Bowel obstruction  Patient Measurements: Height: 5\' 2"  (157.5 cm) Weight: 150 lb 11.2 oz (68.4 kg) IBW/kg (Calculated) : 50.1 TPN AdjBW (KG): 54.8 Body mass index is 27.56 kg/m.  Assessment: 71 yo female with functional duodenal obstruction with gastric distention.  Pt is at risk for refeeding syndrome.   GI:  Endo:  Insulin requirements in the past 24 hours: 4 units Lytes:K+: 3.5 Mg: 1.6  Phos:  4.1 Renal:Scr: 0.66, CrCl: 60.42ml/min  Best Practices: TPN Access: Placed 7/11 TPN start date:7/11  Nutritional Goals (per RD recommendation on 7/11): UOHF:2902 kcal Protein: 96g  Current Nutrition: Clinimix E 5/15 at 80 ml/hr + 20% ILE at 20 ml/hr over 12 hours.   Plan:   Patients labs are WNL. Will continue TPN at goal regimen of Clinimix E 5/15 at 80 ml/hr + 20% ILE at 20 ml/hr over 12 hours.  Goal regimen provides 1843 kcal, 96 grams of protein, 2160 ml fluid daily.   Add  MVI and trace elements in TPN Sensitive SSI Q6H, No units of regular insulin in TPN at this time Monitor TPN labs per protocol F/U daily   Ramond Dial, PharmD, BCPS Clinical Pharmacist 03/08/2017 7:42 AM

## 2017-03-08 NOTE — Care Management Important Message (Signed)
Important Message  Patient Details  Name: Sophia Nelson MRN: [id number] Date of Birth: 08-21-1946   Medicare Important Message Given:  Yes    Shelbie Ammons, RN 03/08/2017, 8:41 AM

## 2017-03-08 NOTE — Op Note (Signed)
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 (icd-10's: K31.1, C79.89, C50.919)  PROCEDURE(S):  1.) Retrocolic retrogastric Roux-en-Y (gastrojejunostomy) bypass of distal duodenal obstruction (cpt: 43820) 2.) Segmental jejunal small bowel resection for accessible tumor pathology (cpt: 26834)  INTRAOPERATIVE FINDINGS: Large firm irregular mass encasing duodenum at approximately the ligament of Treitz with diffuse tumor implant studding along much of small intestinal omentum and serosa, and well-perfused gastro-jejunal + jejuno-jejunal anastomoses widely patent without tension or leak  INTRAVENOUS FLUIDS: 1400 mL crystalloid   ESTIMATED BLOOD LOSS: 50 mL   URINE OUTPUT: 325 mL   SPECIMENS: Source of Specimen:  Jejunum  IMPLANTS: None  DRAINS: none  COMPLICATIONS: None apparent  CONDITION AT END OF PROCEDURE: Hemodynamically stable and extubated  DISPOSITION OF PATIENT: PACU  INDICATIONS FOR PROCEDURE:  71 y.o. female presented to [facility] ED with recurrence of upper abdominal pain and N/V after recently having been admitted for same and discharged home with diagnosis of gastroparesis after EGD did not demonstrate evidence of obstruction or external compression. Patient reports she was comfortable at home for 1 day before her upper abdominal pain, nausea, and bilious emesis recurred. Patient also has a history significant for Right breast ductal CA s/p mastectomy (2009, [facility]) and metastatic Left breast lobular carcinoma s/p mastectomy (2017, [facility]).  CT during her prior admission also visualized a pathologic L3 fracture with radiolucency, suggestive of metastatic disease. CT enterography suggests neoplasm at ligament of Treitz with subsequent endoscopy also finding external compression at that site. All risks, benefits, and alternatives to above procedures were discussed with the patient, all of patient's questions were answered to her expressed satisfaction, and informed consent was obtained and documented.  DETAILS OF PROCEDURE: Patient was brought to the operating suite and appropriately identified. General anesthesia was administered along with appropriate pre-operative antibiotics, and endotracheal intubation was performed by anesthetist. In supine position, operative site was prepped and draped in the usual sterile fashion, and following a brief time out, a vertical midline incision was made from the xiphoid to just above the umbilicus and extended deep through subcutaneous tissues using electrocautery until linea alba was identified and incised. Peritoneum was then likewise excised and opened the length of the incision, taking care to avoid injury to underlying viscera. At this time, the ligament of Treitz was readily able to be identified, and small intestine was palpated at this level and found to be encased by a bulky irregular and firm mass, consistent with neoplastic disease. Small intestine was then evaluated in its entirety, and diffuse tumor implant studding of the entire small bowel mesentery and multifocal disease of the small intestinal serosa were identified.  At this time, attention was directed to completion of the planned Roux-en-Y gastrojejunal bypass of distal duodenal obstruction with second jejuno-jejunostomy anastomosis. Nasogastric tube was confirmed to be in good position, and the stomach was mobilized towards the transverse colon in part by dividing short gastric arteries along the stomach's greater curvature using Ligasure  Impact to achieve hemostasis. 30 - 40 cm of jejunum was measured from the ligament of Treitz, at which location jejunum was divided using a 55 mm GIA linear cutting stapler with standard blue  staple cartridge. A second load of the GIA stapler was used to resect an adjacent ~5 cm segment of jejunum, using the Ligasure to incorporate a wedge of tumor implant-studded small intestinal mesenteric fat and taking care to incorporate several larger tumor implants for pathology. The distal end of divided jejunum was then brought retrocolic through a small avascular window created in the transverse colonic mesentery and confirmed to lay comfortably adjacent to the posterior stomach without tension, kinks, or twists. Gastrotomy and jejunal enterotomy were then made using electrocautery, and the blades of GIA reload were advanced into the stomach and jejunum and fired. Staple line was inspected for patency and integrity, and interrupted 2-0 silk sutures were then used to reinforce the staple line in Lembert fashion.  Similarily, the pancreato-biliary limb of jejunum and a segment of jejunum ~40 cm from the gastrojejunostomy were approximated and confirmed to lay without tension, kinking, or twists. An enterotomy was made in each, and a side-to-side anastomosis was created with one blue cartridge reload of the GIA linear cutting staper and the resulting defect was closed with another. Upon inspecting the anastomosis to confirm patency and integrity, interrupted 2-0 silk sutures were again used to reinforce the staple line in Lembert fashion. Unfortunately, a short segment of this anastomosis appeared stenotic relative to the adjacent jejunum. Accordingly, this segment was resected, and a second jejunojejunostomy was created using a reload of the GIA linear cutting stapler, but this time the resulting defect was oversewn closed in two layers, using 3-0 Vicryl for mucosa and interrupted silk suture for to re-approximate the  seroso-muscular layer. Hemostasis was confirmed, the abdominal cavity was irrigated using warm saline, and Exparel was injected the fascia, which was re-approximated using #1 looped PDS sutures from the apex and base of the incision and tied in the middle. Several deep interrupted 3-0 Vicryl sutures were used to re-approximate dermis, the remaining Exparel was injected subcutaneously, and surgical skin staples were used to re-approximated skin, which was cleaned and dried before occlusive dressing was applied.  Patient was then safely able to be extubated, awakened, and transferred to PACU for post-operative monitoring and care.  I was present for all aspects of the above procedure, and there were no complications apparent.

## 2017-03-08 NOTE — Anesthesia Procedure Notes (Signed)
Procedure Name: Intubation Date/Time: 03/08/2017 1:46 PM Performed by: [facility] Pre-anesthesia Checklist: Patient identified, Patient being monitored, Timeout performed, Emergency Drugs available and Suction available Patient Re-evaluated:Patient Re-evaluated prior to induction Oxygen Delivery Method: Circle system utilized Preoxygenation: Pre-oxygenation with 100% oxygen Induction Type: IV induction and Rapid sequence Laryngoscope Size: Mac and 3 Grade View: Grade I Tube type: Oral Tube size: 7.0 mm Number of attempts: 1 Airway Equipment and Method: Stylet Placement Confirmation: ETT inserted through vocal cords under direct vision,  positive ETCO2 and breath sounds checked- equal and bilateral Secured at: 21 cm Tube secured with: Tape Dental Injury: Teeth and Oropharynx as per pre-operative assessment  Difficulty Due To: Difficult Airway- due to anterior larynx Future Recommendations: Recommend- induction with short-acting agent, and alternative techniques readily available

## 2017-03-08 NOTE — OR Nursing (Signed)
New sacral dressing applied to buttocks, old one soiled.  Areas of skin breakdown noted, appears to be blisters on left lower buttocks and right upper.  Reported to [facility].

## 2017-03-08 NOTE — Transfer of Care (Signed)
Immediate Anesthesia Transfer of Care Note  Patient: Sophia Nelson  Procedure(s) Performed: Procedure(s): GASTROJEJUNOSTOMY (N/A)  Patient Location: PACU  Anesthesia Type:General  Level of Consciousness: sedated  Airway & Oxygen Therapy: Patient Spontanous Breathing and Patient connected to nasal cannula oxygen  Post-op Assessment: Report given to RN and Post -op Vital signs reviewed and stable  Post vital signs: Reviewed and stable  Last Vitals:  Vitals:   03/08/17 1040 03/08/17 1643  BP: 129/63 (!) 90/48  Pulse: 79 90  Resp: (!) 22 (!) 22  Temp: 36.8 C (!) 36.2 C    Last Pain:  Vitals:   03/08/17 1643  TempSrc: Tympanic  PainSc:          Complications: No apparent anesthesia complications

## 2017-03-09 ENCOUNTER — Encounter: Payer: Self-pay | Admitting: Surgery

## 2017-03-09 LAB — BASIC METABOLIC PANEL
ANION GAP: 5 (ref 5–15)
BUN: 27 mg/dL — ABNORMAL HIGH (ref 6–20)
CALCIUM: 8.1 mg/dL — AB (ref 8.9–10.3)
CO2: 28 mmol/L (ref 22–32)
Chloride: 106 mmol/L (ref 101–111)
Creatinine, Ser: 0.79 mg/dL (ref 0.44–1.00)
Glucose, Bld: 190 mg/dL — ABNORMAL HIGH (ref 65–99)
Potassium: 4 mmol/L (ref 3.5–5.1)
SODIUM: 139 mmol/L (ref 135–145)

## 2017-03-09 LAB — PHOSPHORUS: Phosphorus: 4 mg/dL (ref 2.5–4.6)

## 2017-03-09 LAB — CBC
HCT: 24.9 % — ABNORMAL LOW (ref 35.0–47.0)
Hemoglobin: 8 g/dL — ABNORMAL LOW (ref 12.0–16.0)
MCH: 26.3 pg (ref 26.0–34.0)
MCHC: 32.2 g/dL (ref 32.0–36.0)
MCV: 81.8 fL (ref 80.0–100.0)
PLATELETS: 303 10*3/uL (ref 150–440)
RBC: 3.04 MIL/uL — AB (ref 3.80–5.20)
RDW: 17.9 % — ABNORMAL HIGH (ref 11.5–14.5)
WBC: 14.1 10*3/uL — AB (ref 3.6–11.0)

## 2017-03-09 LAB — GLUCOSE, CAPILLARY
Glucose-Capillary: 158 mg/dL — ABNORMAL HIGH (ref 65–99)
Glucose-Capillary: 159 mg/dL — ABNORMAL HIGH (ref 65–99)
Glucose-Capillary: 175 mg/dL — ABNORMAL HIGH (ref 65–99)
Glucose-Capillary: 190 mg/dL — ABNORMAL HIGH (ref 65–99)
Glucose-Capillary: 193 mg/dL — ABNORMAL HIGH (ref 65–99)
Glucose-Capillary: 195 mg/dL — ABNORMAL HIGH (ref 65–99)
Glucose-Capillary: 205 mg/dL — ABNORMAL HIGH (ref 65–99)

## 2017-03-09 LAB — MAGNESIUM: Magnesium: 1.8 mg/dL (ref 1.7–2.4)

## 2017-03-09 MED ORDER — TRACE MINERALS CR-CU-MN-SE-ZN 10-1000-500-60 MCG/ML IV SOLN
INTRAVENOUS | Status: AC
  Administered 2017-03-09: 18:00:00 via INTRAVENOUS
  Filled 2017-03-09: qty 1920

## 2017-03-09 MED ORDER — FAT EMULSION 20 % IV EMUL
250.0000 mL | INTRAVENOUS | Status: AC
  Administered 2017-03-09: 250 mL via INTRAVENOUS
  Filled 2017-03-09: qty 250

## 2017-03-09 MED ORDER — INSULIN ASPART 100 UNIT/ML ~~LOC~~ SOLN
SUBCUTANEOUS | Status: AC
  Filled 2017-03-09: qty 1

## 2017-03-09 NOTE — Progress Notes (Signed)
PT Cancellation Note  Patient Details Name: Sophia Nelson MRN: [id number] DOB: Sep 26, 1945   Cancelled Treatment:    Reason Eval/Treat Not Completed: Other (comment)   Chart reviewed.  Procedure 7/13 with general anesthesia.  No continue on transfer orders noted.  Will discharge per therapy protocols and await new orders as appropriate.   Chesley Noon 03/09/2017, 8:17 AM

## 2017-03-09 NOTE — Progress Notes (Signed)
Patient ID: Sophia Nelson, female   DOB: July 20, 1946, 71 y.o.   MRN: [id number]    [facility] PROGRESS NOTE  Sophia Nelson [id number]:[id number] DOB: Aug 24, 1946 DOA: 03/03/2017 PCP: Donnie Coffin, MD  HPI/Subjective: Patient with postoperative abdominal pain. She is asking for ice chips this morning after the surgeon told her that she can have some. No nausea and vomiting with the NG tube. Has not passed gas or had a bowel movement yet.  Objective: Vitals:   03/09/17 0430 03/09/17 0825  BP: (!) 96/49 (!) 101/52  Pulse: 93 91  Resp:  16  Temp:  98.5 F (36.9 C)    Filed Weights   03/07/17 0428 03/08/17 0426 03/09/17 0500  Weight: 68.8 kg (151 lb 11.2 oz) 68.4 kg (150 lb 11.2 oz) 74.3 kg (163 lb 12.8 oz)    ROS: Review of Systems  Constitutional: Negative for chills and fever.  Eyes: Negative for blurred vision.  Respiratory: Negative for cough and shortness of breath.   Cardiovascular: Negative for chest pain.  Gastrointestinal: Positive for abdominal pain. Negative for diarrhea, nausea and vomiting.  Genitourinary: Negative for dysuria.  Musculoskeletal: Negative for joint pain.  Neurological: Negative for dizziness and headaches.   Exam: Physical Exam  Constitutional: She is oriented to person, place, and time.  HENT:  Nose: No mucosal edema.  Mouth/Throat: No oropharyngeal exudate or posterior oropharyngeal edema.  Eyes: Pupils are equal, round, and reactive to light. Conjunctivae, EOM and lids are normal.  Neck: No JVD present. Carotid bruit is not present. No edema present. No thyroid mass and no thyromegaly present.  Cardiovascular: S1 normal and S2 normal.  Exam reveals no gallop.   No murmur heard. Pulses:      Dorsalis pedis pulses are 2+ on the right side, and 2+ on the left side.  Respiratory: No respiratory distress. She has decreased breath sounds in the left lower field. She has no wheezes. She has no rhonchi. She has no rales.  GI: Soft. She exhibits  distension. Bowel sounds are decreased. There is tenderness.  Musculoskeletal:       Right ankle: She exhibits swelling.       Left ankle: She exhibits swelling.  Lymphadenopathy:    She has no cervical adenopathy.  Neurological: She is alert and oriented to person, place, and time. No cranial nerve deficit.  Skin: Skin is warm. No rash noted. Nails show no clubbing.  Psychiatric: She has a normal mood and affect.      Data Reviewed: Basic Metabolic Panel:  Recent Labs Lab 03/03/17 2140  03/05/17 0520 03/06/17 0334 03/06/17 0500 03/06/17 1808 03/08/17 0532 03/09/17 0451  NA 137  < > 136 139 138  --  140 139  K 4.0  < > 3.5 3.2* 4.2  --  3.5 4.0  CL 91*  < > 100* 104 103  --  102 106  CO2 32  < > 28 29 30   --  31 28  GLUCOSE 133*  < > 72 95 149*  --  135* 190*  BUN 45*  < > 22* 10 9  --  13 27*  CREATININE 1.49*  < > 0.82 0.82 0.71  --  0.66 0.79  CALCIUM 9.0  < > 8.5* 8.6* 8.8*  --  8.9 8.1*  MG 2.5*  --  2.3  --  1.7  --  1.6* 1.8  PHOS 3.6  --   --   --  2.5 2.6 4.1 4.0  < > =  values in this interval not displayed. Liver Function Tests:  Recent Labs Lab 03/03/17 2140 03/06/17 1808  AST 23 21  ALT 11* 12*  ALKPHOS 70 67  BILITOT 1.0 0.7  PROT 6.7 6.6  ALBUMIN 3.4* 3.1*    Recent Labs Lab 03/03/17 2140 03/04/17 0503 03/05/17 0520  LIPASE 146* 111* 35   CBC:  Recent Labs Lab 03/03/17 2027 03/04/17 0503 03/06/17 1808 03/09/17 0451  WBC 12.8* 10.6 11.7* 14.1*  HGB 10.6* 10.0* 9.2* 8.0*  HCT 31.1* 30.0* 28.6* 24.9*  MCV 80.6 82.6 83.1 81.8  PLT 409 383 379 303   Cardiac Enzymes:  Recent Labs Lab 03/03/17 2140 03/04/17 0503 03/04/17 1043 03/04/17 1826  TROPONINI <0.03 <0.03 <0.03 <0.03   BNP (last 3 results)  Recent Labs  02/24/17 1952  BNP 9.0     CBG:  Recent Labs Lab 03/08/17 2013 03/09/17 0039 03/09/17 0429 03/09/17 0759 03/09/17 0923  GLUCAP 163* 205* 195* 190* 175*    Recent Results (from the past 240 hour(s))   Urine Culture     Status: Abnormal   Collection Time: 03/05/17  9:50 AM  Result Value Ref Range Status   Specimen Description URINE, CLEAN CATCH  Final   Special Requests NONE  Final   Culture (A)  Final    <10,000 COLONIES/mL INSIGNIFICANT GROWTH Performed at [facility], [place] [address] [place], [place] [address]    Report Status 03/06/2017 FINAL  Final     Studies: Dg Abd 1 View  Result Date: 03/07/2017 CLINICAL DATA:  Nasogastric tube placement. EXAM: ABDOMEN - 1 VIEW COMPARISON:  PET- CT March 07, 2017 at 1400 hours FINDINGS: Nasogastric tube tip and side-port project in proximal stomach. Bowel gas pattern is nondilated and nonobstructive. No intra-abdominal mass effect or pathologic calcifications. Patient's known L3 metastasis difficult to identify by plain radiography. IMPRESSION: Nasogastric tube tip projects in proximal stomach. Electronically Signed   By: Elon Alas M.D.   On: 03/07/2017 17:57   Nm Pet Image Restag (ps) Skull Base To Thigh  Result Date: 03/07/2017 CLINICAL DATA:  Subsequent Treatment strategy for left breast cancer. Bone metastases. EXAM: NUCLEAR MEDICINE PET SKULL BASE TO THIGH TECHNIQUE: 13.0 mCi F-18 FDG was injected intravenously. Full-ring PET imaging was performed from the skull base to thigh after the radiotracer. CT data was obtained and used for attenuation correction and anatomic localization. FASTING BLOOD GLUCOSE:  Value: 130 mg/dl COMPARISON:  PET-CT 12/20/2015. Abdomen and pelvis CT from 03/05/2017. Chest CT 02/24/2017 FINDINGS: NECK No hypermetabolic lymph nodes in the neck. CHEST No hypermetabolic mediastinal or hilar nodes. No suspicious pulmonary nodules on the CT scan. There is mild diffuse FDG uptake in the left pectoralis minor muscle which is asymmetrically larger than the right. There is no focal lesion within the muscle and the uptake is diffuse, not focal. Emphysema noted in the lungs. Bibasilar collapse/consolidation evident,  left greater than right. Small left pleural effusion noted. ABDOMEN/PELVIS No abnormal hypermetabolic activity within the liver, pancreas, adrenal glands, or spleen. No hypermetabolic lymph nodes in the abdomen or pelvis. Stomach and duodenum less distended than on the diagnostic CT of 02/24/2017. Small lymph nodes in the gastrohepatic ligament and para- aortic space are unchanged since 02/24/2017 and 12/20/2015. No hypermetabolism in these lymph nodes on today's study. Mild right hydronephrosis is stable. Trace free fluid is noted in the cul-de-sac. SKELETON No focal hypermetabolic activity to suggest skeletal metastasis. IMPRESSION: 1. Interval development of hypermetabolic metastatic lesion in the left aspect  of the L3 vertebral body with apparent extension into the left epidural space on CT images. Lumbar spine MRI without with contrast could be used to confirm epidural tumor spread. 2. No other evidence for hypermetabolic metastatic disease in the neck, chest, abdomen, or pelvis. 3. Diffuse FDG uptake identified in the left pectoralis minor muscle, indeterminate as there is no focal lesion within the muscle. Given the asymmetry, some component of muscle edema or inflammation may be present accounting for the asymmetric uptake. Electronically Signed   By: Misty Stanley M.D.   On: 03/07/2017 15:27    Scheduled Meds: . enoxaparin (LOVENOX) injection  40 mg Subcutaneous Q24H  . insulin aspart  0-9 Units Subcutaneous Q4H  . letrozole  2.5 mg Oral Daily  . loratadine  10 mg Oral Daily  . mometasone-formoterol  2 puff Inhalation BID  . montelukast  10 mg Oral QHS  . palbociclib  125 mg Oral Q lunch  . pantoprazole (PROTONIX) IV  40 mg Intravenous Q24H  . tiotropium  18 mcg Inhalation BH-q7a   Continuous Infusions: . Marland KitchenTPN (CLINIMIX-E) Adult 80 mL/hr at 03/08/17 1813  . Marland KitchenTPN (CLINIMIX-E) Adult    . fat emulsion      Assessment/Plan:  1. Functional duodenal obstruction with gastric distention. S/p  surgery yesterday. NG tube To suction. TPN for nutrition. Ice chips 2. Relative hypoglycemia resolved with using TPN and diabetic medications on hold. 3. Hypokalemia. replaced 4. Hypomagnesemia replaced 5. Metastatic breast cancer L3 bone. On letrozole and palbociclib.  Case discussed with oncology. The patient has a history of metastatic breast cancer. Further workup after surgery. 6. GERD on Protonix 7. COPD on Spiriva 8. Hold all nonessential medications  Code Status:     Code Status Orders        Start     Ordered   03/04/17 0124  Full code  Continuous     03/04/17 0123    Code Status History    Date Active Date Inactive Code Status Order ID Comments User Context   02/24/2017 11:40 PM 02/26/2017  5:06 PM Full Code [id number]  Lance Coon, MD Inpatient     Disposition Plan: TBD  Consultants:  General surgery  Oncology  Gastroenterology  Time spent: 24 minutes  [place]

## 2017-03-09 NOTE — Progress Notes (Signed)
[facility] Hospital Day(s): 5.   Post op day(s): 1 Day Post-Op.   Interval History: Patient seen and examined, no acute events or new complaints overnight. Patient reports moderate peri-incisional pain overall controlled, denies N/V, fever/chills, CP, or SOB. Patient specifically describes that she no longer feels "burning" "coming up" that she experienced when she belched pre-operatively and says she can tell that "something feels better".  Review of Systems:  Constitutional: denies fever, chills  HEENT: denies cough or congestion  Respiratory: denies any shortness of breath  Cardiovascular: denies chest pain or palpitations  Gastrointestinal: abdominal pain, N/V, and bowel function as per interval history Genitourinary: denies burning with urination or urinary frequency Musculoskeletal: denies pain, decreased motor or sensation Integumentary: denies any other rashes or skin discolorations except post-surgical abdominal wound Neurological: denies HA or vision/hearing changes   Vital signs in last 24 hours: [min-max] current  Temp:  [97.2 F (36.2 C)-98.8 F (37.1 C)] 98.4 F (36.9 C) (07/14 0428) Pulse Rate:  [79-103] 93 (07/14 0430) Resp:  [0-24] 18 (07/14 0428) BP: (90-150)/(41-74) 96/49 (07/14 0430) SpO2:  [92 %-100 %] 100 % (07/14 0428) Weight:  [163 lb 12.8 oz (74.3 kg)] 163 lb 12.8 oz (74.3 kg) (07/14 0500)     Height: 5\' 2"  (157.5 cm) Weight: 163 lb 12.8 oz (74.3 kg) BMI (Calculated): 27.9   Intake/Output this shift:  No intake/output data recorded.   Intake/Output last 2 shifts:  @IOLAST2SHIFTS @   Physical Exam:  Constitutional: alert, cooperative and no distress  HENT: normocephalic without obvious abnormality  Eyes: PERRL, EOM's grossly intact and symmetric  Neuro: CN II - XII grossly intact and symmetric without deficit  Respiratory: breathing non-labored at rest  Cardiovascular: regular rate and sinus rhythm  Gastrointestinal: soft and  non-distended with moderate peri-incisional tenderness to palpation, well-approximated upper midline incision without any erythema or drainage, dressing c/d/i Musculoskeletal: UE and LE FROM, no wounds appreciated, motor and sensation grossly intact, NT   Labs:  CBC Latest Ref Rng & Units 03/09/2017 03/06/2017 03/04/2017  WBC 3.6 - 11.0 K/uL 14.1(H) 11.7(H) 10.6  Hemoglobin 12.0 - 16.0 g/dL 8.0(L) 9.2(L) 10.0(L)  Hematocrit 35.0 - 47.0 % 24.9(L) 28.6(L) 30.0(L)  Platelets 150 - 440 K/uL 303 379 383   CMP Latest Ref Rng & Units 03/09/2017 03/08/2017 03/06/2017  Glucose 65 - 99 mg/dL 190(H) 135(H) -  BUN 6 - 20 mg/dL 27(H) 13 -  Creatinine 0.44 - 1.00 mg/dL 0.79 0.66 -  Sodium 135 - 145 mmol/L 139 140 -  Potassium 3.5 - 5.1 mmol/L 4.0 3.5 -  Chloride 101 - 111 mmol/L 106 102 -  CO2 22 - 32 mmol/L 28 31 -  Calcium 8.9 - 10.3 mg/dL 8.1(L) 8.9 -  Total Protein 6.5 - 8.1 g/dL - - 6.6  Total Bilirubin 0.3 - 1.2 mg/dL - - 0.7  Alkaline Phos 38 - 126 U/L - - 67  AST 15 - 41 U/L - - 21  ALT 14 - 54 U/L - - 12(L)   Imaging studies: No new pertinent imaging studies   Assessment/Plan: (ICD-10's: K31.5, C79.9) 71 y.o.femaledoing well 1 Day Post-Op s/p Roux-en-Y gastrojejunal bypass and segmental small bowel resection containing diffuse mesenteric and serosal tumor studding for duodenal obstruction at ligament of Treitz (likely recurrent metastatic lobular breast carcinoma), complicated by comorbidities including newly diagnosed likely metastatic breast cancer, DM, HTN, shortness of breath on exertion attributable to COPD not on home O2, GERD, chronic anemia, and osteoarthritis.   - pain  control prn  - f/u surgical pathology - nasogastric decompression - TPN and correct electrolytes prn  - okay with a few ice chips per shift  - anticipate may d/c NGT tomorrow, but will keep NPO until at least Monday, 7/16 (48 hrs)  - currently do not anticipate need for UGI prior to d/c NGT  or initiation CLD - medical management of comorbidities  - ambulation encouraged - DVT prophylaxis  All of the above findings and recommendations were discussed with the patient, patient's family, and the medical team, and all of patient's and family's questions were answered to their expressed satisfaction.  Thank you for the opportunity to participate in this patient's care.  -- [name] Rosana Hoes, MD, [place]: [facility] - Partnering for exceptional care. Office: [phone]

## 2017-03-09 NOTE — Progress Notes (Signed)
PT Cancellation Note  Patient Details Name: Sophia Nelson MRN: [id number] DOB: 1946/01/16   Cancelled Treatment:    Reason Eval/Treat Not Completed: Medical issues which prohibited therapy (Per chart review, patient noted with small bowel resection under general anesthesia previous date.  Per policy, will require new orders to resume PT services post-procedure.  Initial order completed; please re-consult as medically appropriate.)   Mauri Temkin H. Owens Shark, PT, DPT, NCS 03/09/17, 8:18 AM [phone]

## 2017-03-09 NOTE — Progress Notes (Signed)
PHARMACY - ADULT TOTAL PARENTERAL NUTRITION CONSULT NOTE   Pharmacy Consult for TPN  Indication: Bowel obstruction  Patient Measurements: Height: 5\' 2"  (157.5 cm) Weight: 163 lb 12.8 oz (74.3 kg) IBW/kg (Calculated) : 50.1 TPN AdjBW (KG): 54.8 Body mass index is 29.96 kg/m.  Assessment: 71 yo female with functional duodenal obstruction with gastric distention.  Pt is at risk for refeeding syndrome.   GI:  Endo:  Insulin requirements in the past 24 hours: 4 units Lytes:K+: 3.5 Mg: 1.6  Phos:  4.1 Renal:Scr: 0.66, CrCl: 60.58ml/min  Best Practices: TPN Access: Placed 7/11 TPN start date:7/11  Nutritional Goals (per RD recommendation on 7/11): HLKT:6256 kcal Protein: 96g  Current Nutrition: Clinimix E 5/15 at 80 ml/hr + 20% ILE at 20 ml/hr over 12 hours.   Plan:   Patients labs are WNL. Will continue TPN at goal regimen of Clinimix E 5/15 at 80 ml/hr + 20% ILE at 20 ml/hr over 12 hours.  Goal regimen provides 1843 kcal, 96 grams of protein, 2160 ml fluid daily.   Add  MVI and trace elements in TPN Sensitive SSI Q6H, No units of regular insulin in TPN at this time Monitor TPN labs per protocol F/U daily   [place] Clinical Pharmacist 03/09/2017 9:59 AM

## 2017-03-10 LAB — CBC
HEMATOCRIT: 21.9 % — AB (ref 35.0–47.0)
HEMOGLOBIN: 7 g/dL — AB (ref 12.0–16.0)
MCH: 26.3 pg (ref 26.0–34.0)
MCHC: 32.1 g/dL (ref 32.0–36.0)
MCV: 81.9 fL (ref 80.0–100.0)
Platelets: 270 10*3/uL (ref 150–440)
RBC: 2.68 MIL/uL — ABNORMAL LOW (ref 3.80–5.20)
RDW: 17.9 % — AB (ref 11.5–14.5)
WBC: 14.5 10*3/uL — ABNORMAL HIGH (ref 3.6–11.0)

## 2017-03-10 LAB — BASIC METABOLIC PANEL
Anion gap: 4 — ABNORMAL LOW (ref 5–15)
BUN: 31 mg/dL — AB (ref 6–20)
CHLORIDE: 105 mmol/L (ref 101–111)
CO2: 27 mmol/L (ref 22–32)
CREATININE: 0.87 mg/dL (ref 0.44–1.00)
Calcium: 8.2 mg/dL — ABNORMAL LOW (ref 8.9–10.3)
GFR calc Af Amer: >60 mL/min (ref >60)
GFR calc non Af Amer: >60 mL/min (ref >60)
GLUCOSE: 183 mg/dL — AB (ref 65–99)
Potassium: 3.7 mmol/L (ref 3.5–5.1)
Sodium: 136 mmol/L (ref 135–145)

## 2017-03-10 LAB — GLUCOSE, CAPILLARY
GLUCOSE-CAPILLARY: 158 mg/dL — AB (ref 65–99)
GLUCOSE-CAPILLARY: 198 mg/dL — AB (ref 65–99)
GLUCOSE-CAPILLARY: 98 mg/dL (ref 65–99)
Glucose-Capillary: 139 mg/dL — ABNORMAL HIGH (ref 65–99)
Glucose-Capillary: 187 mg/dL — ABNORMAL HIGH (ref 65–99)
Glucose-Capillary: 192 mg/dL — ABNORMAL HIGH (ref 65–99)
Glucose-Capillary: 208 mg/dL — ABNORMAL HIGH (ref 65–99)
Glucose-Capillary: 219 mg/dL — ABNORMAL HIGH (ref 65–99)

## 2017-03-10 LAB — PHOSPHORUS: Phosphorus: 3 mg/dL (ref 2.5–4.6)

## 2017-03-10 LAB — MAGNESIUM: Magnesium: 1.8 mg/dL (ref 1.7–2.4)

## 2017-03-10 MED ORDER — ACETAMINOPHEN 160 MG/5ML PO SOLN
650.0000 mg | Freq: Four times a day (QID) | ORAL | Status: DC | PRN
  Filled 2017-03-10: qty 20.3

## 2017-03-10 MED ORDER — OXYCODONE HCL 5 MG/5ML PO SOLN
5.0000 mg | ORAL | Status: DC | PRN
  Administered 2017-03-12 – 2017-03-13 (×2): 5 mg via ORAL
  Filled 2017-03-10 (×3): qty 5

## 2017-03-10 MED ORDER — PHENOL 1.4 % MT LIQD
1.0000 | OROMUCOSAL | Status: DC | PRN
  Administered 2017-03-10 (×2): 1 via OROMUCOSAL
  Filled 2017-03-10: qty 177

## 2017-03-10 MED ORDER — FAT EMULSION 20 % IV EMUL
250.0000 mL | INTRAVENOUS | Status: AC
  Administered 2017-03-10: 250 mL via INTRAVENOUS
  Filled 2017-03-10: qty 250

## 2017-03-10 MED ORDER — TRACE MINERALS CR-CU-MN-SE-ZN 10-1000-500-60 MCG/ML IV SOLN
INTRAVENOUS | Status: AC
  Administered 2017-03-10: 19:00:00 via INTRAVENOUS
  Filled 2017-03-10: qty 1920

## 2017-03-10 NOTE — Progress Notes (Signed)
PHARMACY - ADULT TOTAL PARENTERAL NUTRITION CONSULT NOTE   Pharmacy Consult for TPN  Indication: Bowel obstruction  Patient Measurements: Height: 5\' 2"  (157.5 cm) Weight: 162 lb 11.2 oz (73.8 kg) IBW/kg (Calculated) : 50.1 TPN AdjBW (KG): 54.8 Body mass index is 29.76 kg/m.  Assessment: 71 yo female with functional duodenal obstruction with gastric distention.  Pt is at risk for refeeding syndrome.   GI:  Endo:  Insulin requirements in the past 24 hours: 13 units Lytes:K+: 3.7 Mg: 1.8  Phos:  3.0 Renal:Scr: 0.87, CrCl: 57.4 ml/min  Best Practices: TPN Access: Placed 7/11 TPN start date:7/11  Nutritional Goals (per RD recommendation on 7/11): DJME:2683 kcal Protein: 96g  Current Nutrition: Clinimix E 5/15 at 80 ml/hr + 20% ILE at 20 ml/hr over 12 hours.   Plan:   Patients labs are WNL. Will continue TPN at goal regimen of Clinimix E 5/15 at 80 ml/hr + 20% ILE at 20 ml/hr over 12 hours.  Goal regimen provides 1843 kcal, 96 grams of protein, 2160 ml fluid daily.  Add  MVI and trace elements in TPN Sensitive SSI Q6H, No units of regular insulin in TPN at this time Monitor TPN labs per protocol F/U daily   Sophia Nelson, [place] Pharmacist 03/10/2017 9:21 AM

## 2017-03-10 NOTE — Progress Notes (Addendum)
ADDENDUM: 100 mL bilious fluid drained from NG tube over the past 8 hours and patient now reports passing flatus, denies N/V.  NGT removed, RN updated, anticipate will order clear liquids tomorrow.  -- [name] Rosana Hoes, MD, [place]: [facility] - Partnering for exceptional care. Office: [place] Hospital Day(s): 6.   Post op day(s): 2 Days Post-Op.   Interval History: Patient seen and examined, no acute events or new complaints overnight. Patient reports her pain continues to improve and she has been feeling better after she walked several times in the halls around the nursing station yesterday. She otherwise denies N/V, fever/chills, CP, SOB, or flatus, and NG tube appears to have drained 600 mL over the past 24 hours, though patient states it wasn't working (wasn't draining any and was leaking on the bed) most of yesterday.  Review of Systems:  Constitutional: denies fever, chills  HEENT: denies cough or congestion  Respiratory: denies any shortness of breath  Cardiovascular: denies chest pain or palpitations  Gastrointestinal: abdominal pain, N/V, and bowel function as per interval history Genitourinary: denies burning with urination or urinary frequency Musculoskeletal: denies pain, decreased motor or sensation Integumentary: denies any other rashes or skin discolorations except post-surgical abdominal wound Neurological: denies HA or vision/hearing changes   Vital signs in last 24 hours: [min-max] current  Temp:  [98.4 F (36.9 C)-99.6 F (37.6 C)] 99.6 F (37.6 C) (07/15 0430) Pulse Rate:  [74-97] 97 (07/15 0430) Resp:  [16-24] 24 (07/15 0430) BP: (101-119)/(45-68) 108/45 (07/15 0430) SpO2:  [98 %-100 %] 100 % (07/15 0430) Weight:  [162 lb 11.2 oz (73.8 kg)] 162 lb 11.2 oz (73.8 kg) (07/15 0500)     Height: 5\' 2"  (157.5 cm) Weight: 162 lb 11.2 oz (73.8 kg) BMI (Calculated): 27.9   Intake/Output  this shift:  No intake/output data recorded.   Intake/Output last 2 shifts:  @IOLAST2SHIFTS @   Physical Exam:  Constitutional: alert, cooperative and no distress  HENT: normocephalic without obvious abnormality  Eyes: PERRL, EOM's grossly intact and symmetric  Neuro: CN II - XII grossly intact and symmetric without deficit  Respiratory: breathing non-labored at rest  Cardiovascular: regular rate and sinus rhythm  Gastrointestinal: soft and non-distended with focally moderate peri-incisional tenderness to palpation, incision well-approximated with no surrounding erythema or drainage, dressing c/d/i Musculoskeletal: UE and LE FROM, no edema or wounds, motor and sensation grossly intact, NT   Labs:  CBC Latest Ref Rng & Units 03/10/2017 03/09/2017 03/06/2017  WBC 3.6 - 11.0 K/uL 14.5(H) 14.1(H) 11.7(H)  Hemoglobin 12.0 - 16.0 g/dL 7.0(L) 8.0(L) 9.2(L)  Hematocrit 35.0 - 47.0 % 21.9(L) 24.9(L) 28.6(L)  Platelets 150 - 440 K/uL 270 303 379   CMP Latest Ref Rng & Units 03/10/2017 03/09/2017 03/08/2017  Glucose 65 - 99 mg/dL 183(H) 190(H) 135(H)  BUN 6 - 20 mg/dL 31(H) 27(H) 13  Creatinine 0.44 - 1.00 mg/dL 0.87 0.79 0.66  Sodium 135 - 145 mmol/L 136 139 140  Potassium 3.5 - 5.1 mmol/L 3.7 4.0 3.5  Chloride 101 - 111 mmol/L 105 106 102  CO2 22 - 32 mmol/L 27 28 31   Calcium 8.9 - 10.3 mg/dL 8.2(L) 8.1(L) 8.9  Total Protein 6.5 - 8.1 g/dL - - -  Total Bilirubin 0.3 - 1.2 mg/dL - - -  Alkaline Phos 38 - 126 U/L - - -  AST 15 - 41 U/L - - -  ALT 14 -  54 U/L - - -   Imaging studies: No new pertinent imaging studies   Assessment/Plan: (ICD-10's: K31.5, C79.9) 71 y.o.femaledoing well 2 Days Post-Op s/p Roux-en-Y gastrojejunal bypass and segmental small bowel resection containing diffuse mesenteric and serosal tumor studding for 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              - pain control prn             - f/u surgical pathology - TPN andcorrect electrolytes prn - will f/u NG tube drainage this afternoon and reassess for removal of NG tube             - okay with a few ice chips per shift, but keep NPO for now otherwise (no PO pills)             - currently do not anticipate need for UGI prior to clear liquids, hopefully tomorrow - medical management of comorbidities             - ambulation encouraged - DVT prophylaxis  All of the above findings and recommendations were discussed with the patient and patient's RN, and all of patient's questions were answered to her expressed satisfaction.  Thank you for the opportunity to participate in this patient's care.  -- [name] Rosana Hoes, MD, [place]: [facility] - Partnering for exceptional care. Office: [phone]

## 2017-03-10 NOTE — Progress Notes (Signed)
Patient ID: Sophia Nelson, female   DOB: 09-Mar-1946, 71 y.o.   MRN: [id number]     [facility] PROGRESS NOTE  Sophia Nelson WUJ:[id number] DOB: 10-07-1945 DOA: 03/03/2017 PCP: Donnie Coffin, MD  HPI/Subjective: Patient still with postoperative abdominal pain. When I saw her today she still had an NG tube in. Has been passing gas but no bowel movement yet.  Objective: Vitals:   03/10/17 0430 03/10/17 1155  BP: (!) 108/45 (!) 112/45  Pulse: 97 86  Resp: (!) 24 20  Temp: 99.6 F (37.6 C) 99.3 F (37.4 C)    Filed Weights   03/08/17 0426 03/09/17 0500 03/10/17 0500  Weight: 68.4 kg (150 lb 11.2 oz) 74.3 kg (163 lb 12.8 oz) 73.8 kg (162 lb 11.2 oz)    ROS: Review of Systems  Constitutional: Negative for chills and fever.  Eyes: Negative for blurred vision.  Respiratory: Negative for cough and shortness of breath.   Cardiovascular: Negative for chest pain.  Gastrointestinal: Positive for abdominal pain. Negative for diarrhea, nausea and vomiting.  Genitourinary: Negative for dysuria.  Musculoskeletal: Negative for joint pain.  Neurological: Negative for dizziness and headaches.   Exam: Physical Exam  Constitutional: She is oriented to person, place, and time.  HENT:  Nose: No mucosal edema.  Mouth/Throat: No oropharyngeal exudate or posterior oropharyngeal edema.  Eyes: Pupils are equal, round, and reactive to light. Conjunctivae, EOM and lids are normal.  Neck: No JVD present. Carotid bruit is not present. No edema present. No thyroid mass and no thyromegaly present.  Cardiovascular: S1 normal and S2 normal.  Exam reveals no gallop.   No murmur heard. Pulses:      Dorsalis pedis pulses are 2+ on the right side, and 2+ on the left side.  Respiratory: No respiratory distress. She has decreased breath sounds in the left lower field. She has no wheezes. She has no rhonchi. She has no rales.  GI: Soft. She exhibits distension. Bowel sounds are decreased. There is  tenderness.  Musculoskeletal:       Right ankle: She exhibits swelling.       Left ankle: She exhibits swelling.  Lymphadenopathy:    She has no cervical adenopathy.  Neurological: She is alert and oriented to person, place, and time. No cranial nerve deficit.  Skin: Skin is warm. No rash noted. Nails show no clubbing.  Psychiatric: She has a normal mood and affect.      Data Reviewed: Basic Metabolic Panel:  Recent Labs Lab 03/05/17 0520 03/06/17 0334 03/06/17 0500 03/06/17 1808 03/08/17 0532 03/09/17 0451 03/10/17 0500  NA 136 139 138  --  140 139 136  K 3.5 3.2* 4.2  --  3.5 4.0 3.7  CL 100* 104 103  --  102 106 105  CO2 28 29 30   --  31 28 27   GLUCOSE 72 95 149*  --  135* 190* 183*  BUN 22* 10 9  --  13 27* 31*  CREATININE 0.82 0.82 0.71  --  0.66 0.79 0.87  CALCIUM 8.5* 8.6* 8.8*  --  8.9 8.1* 8.2*  MG 2.3  --  1.7  --  1.6* 1.8 1.8  PHOS  --   --  2.5 2.6 4.1 4.0 3.0   Liver Function Tests:  Recent Labs Lab 03/03/17 2140 03/06/17 1808  AST 23 21  ALT 11* 12*  ALKPHOS 70 67  BILITOT 1.0 0.7  PROT 6.7 6.6  ALBUMIN 3.4* 3.1*    Recent  Labs Lab 03/03/17 2140 03/04/17 0503 03/05/17 0520  LIPASE 146* 111* 35   CBC:  Recent Labs Lab 03/03/17 2027 03/04/17 0503 03/06/17 1808 03/09/17 0451 03/10/17 0500  WBC 12.8* 10.6 11.7* 14.1* 14.5*  HGB 10.6* 10.0* 9.2* 8.0* 7.0*  HCT 31.1* 30.0* 28.6* 24.9* 21.9*  MCV 80.6 82.6 83.1 81.8 81.9  PLT 409 383 379 303 270   Cardiac Enzymes:  Recent Labs Lab 03/03/17 2140 03/04/17 0503 03/04/17 1043 03/04/17 1826  TROPONINI <0.03 <0.03 <0.03 <0.03   BNP (last 3 results)  Recent Labs  02/24/17 1952  BNP 9.0     CBG:  Recent Labs Lab 03/09/17 2357 03/10/17 0156 03/10/17 0428 03/10/17 0753 03/10/17 1153  GLUCAP 192* 187* 219* 208* 158*    Recent Results (from the past 240 hour(s))  Urine Culture     Status: Abnormal   Collection Time: 03/05/17  9:50 AM  Result Value Ref Range Status    Specimen Description URINE, CLEAN CATCH  Final   Special Requests NONE  Final   Culture (A)  Final    <10,000 COLONIES/mL INSIGNIFICANT GROWTH Performed at [facility], [place] [address], [place], [place] [address]    Report Status 03/06/2017 FINAL  Final      Scheduled Meds: . enoxaparin (LOVENOX) injection  40 mg Subcutaneous Q24H  . insulin aspart  0-9 Units Subcutaneous Q4H  . letrozole  2.5 mg Oral Daily  . loratadine  10 mg Oral Daily  . mometasone-formoterol  2 puff Inhalation BID  . montelukast  10 mg Oral QHS  . palbociclib  125 mg Oral Q lunch  . pantoprazole (PROTONIX) IV  40 mg Intravenous Q24H  . tiotropium  18 mcg Inhalation BH-q7a   Continuous Infusions: . Marland KitchenTPN (CLINIMIX-E) Adult 80 mL/hr at 03/10/17 0705  . Marland KitchenTPN (CLINIMIX-E) Adult    . fat emulsion      Assessment/Plan:  1. Functional duodenal obstruction with gastric distention. S/p surgery. NG tube To suction. TPN for nutrition. Ice chips. 2. Anemia. Hemoglobin has drifted down. 7.0 today. I offered a transfusion. She wants to speak with her son in Dr. Rogue Bussing first. Get a type and cross tomorrow and CBC tomorrow. 3. Type 2 diabetes mellitus. Sugars are starting to increase on the TPN. 4. Hypokalemia. replaced 5. Hypomagnesemia replaced 6. Metastatic breast cancer L3 bone. On letrozole and palbociclib.  Case discussed with oncology. The patient has a history of metastatic breast cancer. Further workup after recovery from 7. GERD on Protonix 8. COPD on Spiriva 9. Hold all nonessential medications  Code Status:     Code Status Orders        Start     Ordered   03/04/17 0124  Full code  Continuous     03/04/17 0123    Code Status History    Date Active Date Inactive Code Status Order ID Comments User Context   02/24/2017 11:40 PM 02/26/2017  5:06 PM Full Code [id number]  Lance Coon, MD Inpatient     Disposition Plan: TBD  Consultants:  General  surgery  Oncology  Gastroenterology  Time spent: 25 minutes  [place]

## 2017-03-11 DIAGNOSIS — Z9889 Other specified postprocedural states: Secondary | ICD-10-CM

## 2017-03-11 DIAGNOSIS — C50919 Malignant neoplasm of unspecified site of unspecified female breast: Secondary | ICD-10-CM

## 2017-03-11 DIAGNOSIS — C784 Secondary malignant neoplasm of small intestine: Secondary | ICD-10-CM

## 2017-03-11 LAB — BASIC METABOLIC PANEL
Anion gap: 4 — ABNORMAL LOW (ref 5–15)
BUN: 30 mg/dL — AB (ref 6–20)
CALCIUM: 8.1 mg/dL — AB (ref 8.9–10.3)
CO2: 28 mmol/L (ref 22–32)
CREATININE: 0.8 mg/dL (ref 0.44–1.00)
Chloride: 106 mmol/L (ref 101–111)
GFR calc non Af Amer: >60 mL/min (ref >60)
GLUCOSE: 182 mg/dL — AB (ref 65–99)
Potassium: 3.7 mmol/L (ref 3.5–5.1)
Sodium: 138 mmol/L (ref 135–145)

## 2017-03-11 LAB — CBC
HCT: 18.2 % — ABNORMAL LOW (ref 35.0–47.0)
Hemoglobin: 5.9 g/dL — ABNORMAL LOW (ref 12.0–16.0)
MCH: 26.1 pg (ref 26.0–34.0)
MCHC: 32.1 g/dL (ref 32.0–36.0)
MCV: 81.2 fL (ref 80.0–100.0)
PLATELETS: 250 10*3/uL (ref 150–440)
RBC: 2.25 MIL/uL — ABNORMAL LOW (ref 3.80–5.20)
RDW: 18.1 % — AB (ref 11.5–14.5)
WBC: 13.2 10*3/uL — ABNORMAL HIGH (ref 3.6–11.0)

## 2017-03-11 LAB — HEMOGLOBIN: Hemoglobin: 7.2 g/dL — ABNORMAL LOW (ref 12.0–16.0)

## 2017-03-11 LAB — GLUCOSE, CAPILLARY
GLUCOSE-CAPILLARY: 206 mg/dL — AB (ref 65–99)
GLUCOSE-CAPILLARY: 149 mg/dL — AB (ref 65–99)
Glucose-Capillary: 170 mg/dL — ABNORMAL HIGH (ref 65–99)
Glucose-Capillary: 187 mg/dL — ABNORMAL HIGH (ref 65–99)
Glucose-Capillary: 177 mg/dL — ABNORMAL HIGH (ref 65–99)
Glucose-Capillary: 180 mg/dL — ABNORMAL HIGH (ref 65–99)

## 2017-03-11 LAB — PHOSPHORUS: Phosphorus: 3.6 mg/dL (ref 2.5–4.6)

## 2017-03-11 LAB — MAGNESIUM: Magnesium: 1.9 mg/dL (ref 1.7–2.4)

## 2017-03-11 LAB — ALBUMIN: Albumin: 2.1 g/dL — ABNORMAL LOW (ref 3.5–5.0)

## 2017-03-11 LAB — PREPARE RBC (CROSSMATCH)

## 2017-03-11 MED ORDER — TRACE MINERALS CR-CU-MN-SE-ZN 10-1000-500-60 MCG/ML IV SOLN
INTRAVENOUS | Status: AC
  Administered 2017-03-11: 18:00:00 via INTRAVENOUS
  Filled 2017-03-11: qty 1920

## 2017-03-11 MED ORDER — SODIUM CHLORIDE 0.9% FLUSH: 10.0000 mL | INTRAVENOUS | Status: DC | PRN

## 2017-03-11 MED ORDER — INSULIN ASPART 100 UNIT/ML ~~LOC~~ SOLN
0.0000 [IU] | SUBCUTANEOUS | Status: DC
  Administered 2017-03-11: 2 [IU] via SUBCUTANEOUS
  Administered 2017-03-11 (×2): 3 [IU] via SUBCUTANEOUS
  Administered 2017-03-12: 21:00:00 2 [IU] via SUBCUTANEOUS
  Administered 2017-03-12 (×3): 3 [IU] via SUBCUTANEOUS
  Administered 2017-03-12 – 2017-03-13 (×3): 2 [IU] via SUBCUTANEOUS
  Filled 2017-03-11 (×10): qty 1

## 2017-03-11 MED ORDER — FAT EMULSION 20 % IV EMUL
250.0000 mL | INTRAVENOUS | Status: AC
  Administered 2017-03-11: 250 mL via INTRAVENOUS
  Filled 2017-03-11: qty 250

## 2017-03-11 MED ORDER — SODIUM CHLORIDE 0.9 % IV SOLN
Freq: Once | INTRAVENOUS | Status: AC
  Administered 2017-03-11: 11:00:00 via INTRAVENOUS

## 2017-03-11 NOTE — Progress Notes (Addendum)
[place] at [place] NAME: Sophia Nelson    MR#:  [id number]  DATE OF BIRTH:  Jun 11, 1946  SUBJECTIVE:  Tolerating CLD Weak TPN going well  REVIEW OF SYSTEMS:   Review of Systems  Constitutional: Negative for chills, fever and weight loss.  HENT: Negative for ear discharge, ear pain and nosebleeds.   Eyes: Negative for blurred vision, pain and discharge.  Respiratory: Negative for sputum production, shortness of breath, wheezing and stridor.   Cardiovascular: Negative for chest pain, palpitations, orthopnea and PND.  Gastrointestinal: Negative for abdominal pain, diarrhea, nausea and vomiting.  Genitourinary: Negative for frequency and urgency.  Musculoskeletal: Negative for back pain and joint pain.  Neurological: Positive for weakness. Negative for sensory change, speech change and focal weakness.  Psychiatric/Behavioral: Negative for depression and hallucinations. The patient is not nervous/anxious.    Tolerating Diet:CLD Tolerating PT: pending  DRUG ALLERGIES:   Allergies  Allergen Reactions  . Other     Onions(that grow in the yard-pt can eat onions without problems) and dust mite Sneezing, cough, runny nose    VITALS:  Blood pressure (!) 105/37, pulse 86, temperature 98.3 F (36.8 C), temperature source Oral, resp. rate 18, height 5\' 2"  (1.575 m), weight 70 kg (154 lb 6.4 oz), SpO2 100 %.  PHYSICAL EXAMINATION:   Physical Exam  GENERAL:  71 y.o.-year-old patient lying in the bed with no acute distress.  EYES: Pupils equal, round, reactive to light and accommodation. No scleral icterus. Extraocular muscles intact.  HEENT: Head atraumatic, normocephalic. Oropharynx and nasopharynx clear.  NECK:  Supple, no jugular venous distention. No thyroid enlargement, no tenderness.  LUNGS: Normal breath sounds bilaterally, no wheezing, rales, rhonchi. No use of accessory muscles of respiration.  CARDIOVASCULAR: S1, S2 normal.  No murmurs, rubs, or gallops.  ABDOMEN: Soft, nontender, nondistended. Bowel sounds present. No organomegaly or mass.  EXTREMITIES: No cyanosis, clubbing or edema b/l.    NEUROLOGIC: Cranial nerves II through XII are intact. No focal Motor or sensory deficits b/l.   PSYCHIATRIC:  patient is alert and oriented x 3.  SKIN: No obvious rash, lesion, or ulcer.   LABORATORY PANEL:  CBC  Recent Labs Lab 03/11/17 0417  WBC 13.2*  HGB 5.9*  HCT 18.2*  PLT 250    Chemistries   Recent Labs Lab 03/06/17 1808  03/11/17 0417  NA  --   < > 138  K  --   < > 3.7  CL  --   < > 106  CO2  --   < > 28  GLUCOSE  --   < > 182*  BUN  --   < > 30*  CREATININE  --   < > 0.80  CALCIUM  --   < > 8.1*  MG  --   < > 1.9  AST 21  --   --   ALT 12*  --   --   ALKPHOS 67  --   --   BILITOT 0.7  --   --   < > = values in this interval not displayed. Cardiac Enzymes  Recent Labs Lab 03/04/17 1826  TROPONINI <0.03   RADIOLOGY:  No results found. ASSESSMENT AND PLAN:   Sophia Rissmiller Johnsonis a 71 y.o.femalewith a past medical history of arthritis, COPD, gastric reflux, diabetes, hypertension, presents to the emergency department with chest pain. Patient was admitted to the hospital from 02/24/17 - 02/26/17 for abdominal pain / nausea found  to have gastroparesis/ outlet obstruction.  Abdominal pain and nausea have been well controlled however she sought medical attention today when she developed central chest pain associated with indigestion.  1.  duodenal obstruction with gastric distention. S/p surgery POD #3 . NG tube Removed. TPN for nutrition. Patient started on clear liquid to full liquids tomorrow. Per surgery wean her TPN to office in 2 days.   2. Anemia. Hemoglobin has drifted down. 7.0 today.  3. Type 2 diabetes mellitus. Sugars are starting to increase on the TPN. 3. Hypokalemia. replaced 4. Hypomagnesemia replaced 5. Metastatic breast cancer L3 bone. On letrozole and palbociclib.  Case  discussed with oncology. The patient has a history of metastatic breast cancer. Further workup after recovery from 6. GERD on Protonix 7. COPD on Spiriva  Case discussed with Care Management/Social Worker. Management plans discussed with the patient, family and they are in agreement.  CODE STATUS: full  DVT Prophylaxis: enoxaparin  TOTAL TIME TAKING CARE OF THIS PATIENT: *30* minutes.  >50% time spent on counselling and coordination of care  POSSIBLE D/C IN 1-2 DAYS, DEPENDING ON CLINICAL CONDITION.  Note: This dictation was prepared with Dragon dictation along with smaller phrase technology. Any transcriptional errors that result from this process are unintentional.  Acie Custis M.D on 03/11/2017 at 3:36 PM  Between 7am to 6pm - Pager - [phone]  After 6pm go to [email or web address] - password [email or web address] [facility]  Office  [phone]  CC: Primary care physician; Donnie Coffin, MD

## 2017-03-11 NOTE — Progress Notes (Signed)
3 Days Post-Op   Subjective:  Patient seen this morning and without complaints. She is tolerating her clear liquid diet.  Vital signs in last 24 hours: Temp:  [98.4 F (36.9 C)-99.7 F (37.6 C)] 98.5 F (36.9 C) (07/16 1207) Pulse Rate:  [89-98] 93 (07/16 1207) Resp:  [18] 18 (07/16 1207) BP: (99-128)/(35-62) 127/42 (07/16 1207) SpO2:  [97 %-100 %] 100 % (07/16 1207) Weight:  [70 kg (154 lb 6.4 oz)] 70 kg (154 lb 6.4 oz) (07/16 0429) Last BM Date: 03/05/17  Intake/Output from previous day: 07/15 0701 - 07/16 0700 In: 1841.3 [I.V.:1841.3] Out: 300 [Emesis/NG output:300]  GI: Abdomen is soft and nontender. Midline honeycomb dressing in place without any evidence of active drainage or infection.  Lab Results:  CBC  Recent Labs  03/10/17 0500 03/11/17 0417  WBC 14.5* 13.2*  HGB 7.0* 5.9*  HCT 21.9* 18.2*  PLT 270 250   CMP     Component Value Date/Time   NA 138 03/11/2017 0417   NA 133 (L) 04/08/2014 1738   K 3.7 03/11/2017 0417   K 4.2 04/08/2014 1738   CL 106 03/11/2017 0417   CL 99 04/08/2014 1738   CO2 28 03/11/2017 0417   CO2 28 04/08/2014 1738   GLUCOSE 182 (H) 03/11/2017 0417   GLUCOSE 116 (H) 04/08/2014 1738   BUN 30 (H) 03/11/2017 0417   BUN 5 (L) 04/08/2014 1738   CREATININE 0.80 03/11/2017 0417   CREATININE 0.42 (L) 04/08/2014 1738   CALCIUM 8.1 (L) 03/11/2017 0417   CALCIUM 9.5 04/08/2014 1738   PROT 6.6 03/06/2017 1808   ALBUMIN 2.1 (L) 03/11/2017 0417   AST 21 03/06/2017 1808   ALT 12 (L) 03/06/2017 1808   ALKPHOS 67 03/06/2017 1808   BILITOT 0.7 03/06/2017 1808   GFRNONAA >60 03/11/2017 0417   GFRNONAA >60 04/08/2014 1738   GFRAA >60 03/11/2017 0417   GFRAA >60 04/08/2014 1738   PT/INR No results for input(s): LABPROT, INR in the last 72 hours.  Studies/Results: No results found.  Assessment/Plan: 71 year old female who is 3 days status post Roux-en-Y bypass of metastatic cancer that was causing bowel obstruction. Patient is  tolerating her clear liquid diet. Would continue clear liquids for today prior to advancing. Would advance diet slowly due to her Roux-en-Y physiology now. Continue TPN until tolerating adequate oral input. Although passing flatus continue to await return of bowel function. Encourage ambulation and incentive spirometer usage. Appreciate internal medicine, nutritionist support with this patient's care. Awaiting final pathology.   Clayburn Pert, MD [facility]  Day [facility] [phone] Night [facility] [phone]  03/11/2017

## 2017-03-11 NOTE — Progress Notes (Signed)
Sophia Nelson   DOB:[birth year]   BW#:[id number]    Subjective: Patient resting comfortably. Denies any significant pain. Denies any shortness of breath or chest pain.   ROS: no constipation or diarrhea. No fevers.   Objective:  Vitals:   03/11/17 1207 03/11/17 1406  BP: (!) 127/42 (!) 105/37  Pulse: 93 86  Resp: 18 18  Temp: 98.5 F (36.9 C) 98.3 F (36.8 C)     Intake/Output Summary (Last 24 hours) at 03/11/17 1703 Last data filed at 03/11/17 1406  Gross per 24 hour  Intake          3141.34 ml  Output              300 ml  Net          2841.34 ml    GENERAL: Well-nourished well-developed; Alert, mild distress and comfortable.   Alone. EYES: no pallor or icterus OROPHARYNX: no thrush or ulceration. NECK: supple, no masses felt LYMPH:  no palpable lymphadenopathy in the cervical, axillary or inguinal regions LUNGS: decreased breath sounds to auscultation at bases and  No wheeze or crackles HEART/CVS: regular rate & rhythm and no murmurs; No lower extremity edema ABDOMEN: abdomen soft, tender and distended. Musculoskeletal:no cyanosis of digits and no clubbing  PSYCH: alert & oriented x 3 with fluent speech NEURO: no focal motor/sensory deficits SKIN:  no rashes or significant lesions Labs:  Lab Results  Component Value Date   WBC 13.2 (H) 03/11/2017   HGB 7.2 (L) 03/11/2017   HCT 18.2 (L) 03/11/2017   MCV 81.2 03/11/2017   PLT 250 03/11/2017   NEUTROABS 4.2 02/13/2017    Lab Results  Component Value Date   NA 138 03/11/2017   K 3.7 03/11/2017   CL 106 03/11/2017   CO2 28 03/11/2017    Studies:  No results found.  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 Pathology pending.  # Metastatic lobular cancer of the breast- on letrozole+ Ibrance [currently on  HOLD]; Lesion noted at L2/lytic- we'll plan MRI after the surgery.   # Anemia-hemoglobin 7-multifactorial. status post transfusion.    [name], MD 03/11/2017  5:03 PM

## 2017-03-11 NOTE — Progress Notes (Signed)
PT Cancellation Note  Patient Details Name: Sophia Nelson MRN: [id number] DOB: 08-21-1946   Cancelled Treatment:    Reason Eval/Treat Not Completed: Medical issues which prohibited therapy.  Pt's Hgb down from 8.0>7.0>5.9.  Additionally, will need new PT order to resume PT following procedure on 7/13.  Please place new PT order if remains appropriate.    Collie Siad PT, DPT 03/11/2017, 8:16 AM

## 2017-03-11 NOTE — Care Management Note (Signed)
Case Management Note  Patient Details  Name: Sophia Nelson MRN: [id number] Date of Birth: [birth year]  Subjective/Objective:                  Met with patient to discuss discharge planning. She is from home with her son. She states that she is giving up driving but her son can provide transportation. She states she uses a cane to ambulate however she is requesting a rolling walker and bedside commode. She will review home health list that has been delivered. Her PCP is Dr. [name]. She states she has a nebulizer available for use at home and chronic O2 through [place]. She denies problems obtaining medications or getting to her appointments.  Action/Plan: Home health list provided to patient for review. Rolling walker and bedside commode requested from [place] [facility].   Expected Discharge Date:                  Expected Discharge Plan:     In-House Referral:     Discharge planning Services  CM Consult  Post Acute Care Choice:  Durable Medical Equipment, Home Health Choice offered to:  Patient  DME Arranged:  Bedside commode, Walker rolling DME Agency:  [place]:  PT [facility] Agency:     Status of Service:  In process, will continue to follow  If discussed at Long Length of Stay Meetings, dates discussed:    Additional Comments:  Chari Parmenter, RN 03/11/2017, 10:44 AM

## 2017-03-11 NOTE — Progress Notes (Signed)
Nutrition Follow-up  DOCUMENTATION CODES:   Severe malnutrition in context of acute illness/injury  INTERVENTION:  Continue Clinimix E 5/15 at 80 ml/hr + 20% IlE at 20 ml/hr over 12 hrs. Provides 1843 kcal, 96 grams of protein, 2160 ml fluid daily.  When diet able to be advanced past clear liquids and patient is tolerating, recommend providing Ensure Enlive po TID, each supplement provides 350 kcal and 20 grams of protein.   Reviewed "Gastric Surgery Nutrition Therapy," "Bariatric Surgery Vitamin and Mineral Supplements," and "High-Calorie, High-Protein Nutrition Therapy" handouts from the Academy of Nutrition and Dietetics. Reviewed how her new anatomy will impact her diet after surgery. Discussed that surgeon will advance her diet as tolerated. Encouraged intake of small, frequent meals (6-8 per day) to obtain adequate calories and protein. Encouraged patient to limit fiber in diet while healing after surgery. Reviewed recommended vitamin and mineral supplements patient should take after surgery due to decreased absorptive capacity.  Recommend complete multivitamin with iron BID (chewable or liquid), sublingual vitamin B12 350-500 micrograms daily, chewable calcium citrate with vitamin D 500 mg TID. For best absorption it is best to take MVI with iron at least 2 hours apart from calcium.  NUTRITION DIAGNOSIS:   Malnutrition (Severe) related to acute illness (small bowel obstruction) as evidenced by energy intake < or equal to 50% for > or equal to 5 days, percent weight loss.  Ongoing.  GOAL:   Patient will meet greater than or equal to 90% of their needs  Met with TPN.  MONITOR:   Diet advancement, Labs, Weight trends, I & O's  REASON FOR ASSESSMENT:   Consult New TPN/TNA  ASSESSMENT:   71 year old female with PMHx of HTN, DM type 2, GERD, COPD, hx right breast cancer s/p mastectomy in 2009, hx of left breast cancer s/p mastectomy in 2017, recent admission 7/1-7/3 with  abdominal pain, N/V for gastric outlet obstruction vs gastroparesis. 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  -Pt s/p retrocolic retrogsatric Roux-en-Y (gastrojejunostomy) bypass of distal duodenal obstruction and segmental jejunal small bowel resection for accessible tumor pathology on 7/13. -NGT removed on 7/15.  -Diet advanced to CLD this morning.  Patient now POD#3 s/p gastrojejunostomy. Met with patient at bedside. She reports she is still having abdominal pain after operation. Denies any nausea. Denies passing flatus or having bowel movements (though noted on surgery note from yesterday patient was passing flatus yesterday). She was able to try clear liquid tray this morning and reports she finished all of her juice and most of her gelatin. Could not tolerate the coffee or broth because they were cold.   Access: right internal jugular triple lumen CVC placed 7/11  TPN: tolerating goal regimen of Clinimix E 5/15 at 80 ml/hr + 20% ILE at 20 ml/hr over 12 hrs  Medications reviewed and include: Novolog 0-9 units Q4hrs (received 9 units past 24 hrs), letrozole, palbociclib, pantoprazole.  Labs reviewed: CBG 98-198 past 24 hrs, BUN 30. Potassium, Phosphorus, Magnesium WNL.  I/O: 300 ml NGT output past 24 hrs (now removed), 6 occurrences unmeasured UOP past 24 hrs  Weight trend: 70 kg on 7/16 (+1 kg from admission wt)  Diet Order:  .TPN (CLINIMIX-E) Adult Diet clear liquid Room service appropriate? Yes; Fluid consistency: Thin  Skin:  Wound (see comment) (closed surgical incision abdomen)  Last BM:  03/05/2017 - type 7  Height:   Ht Readings from Last 1 Encounters:  03/04/17 5'  2" (1.575 m)    Weight:   Wt Readings from Last 1 Encounters:  03/11/17 154 lb 6.4 oz (70 kg)    Ideal Body Weight:  50 kg  BMI:  Body mass index is 28.24 kg/m.  Estimated Nutritional Needs:   Kcal:  1645-1880 (MSJ x  1.4-1.6)  Protein:  83-103 grams (1.2-1.5 grams/kg)  Fluid:  2 L/day (25 ml/kg)  EDUCATION NEEDS:   Education needs addressed  Willey Blade, MS, RD, LDN Pager: [phone] After Hours Pager: [phone]

## 2017-03-11 NOTE — Progress Notes (Addendum)
PHARMACY - ADULT TOTAL PARENTERAL NUTRITION CONSULT NOTE   Pharmacy Consult for TPN  Indication: Bowel obstruction  Patient Measurements: Height: 5\' 2"  (157.5 cm) Weight: 154 lb 6.4 oz (70 kg) IBW/kg (Calculated) : 50.1 TPN AdjBW (KG): 54.8 Body mass index is 28.24 kg/m.  Assessment: 71 yo female with functional duodenal obstruction with gastric distention.  Pt is at risk for refeeding syndrome.   GI:  Endo:  Insulin requirements in the past 24 hours: 13 units Lytes:K+: 3.7 Mg: 1.8  Phos:  3.0 Renal:Scr: 0.87, CrCl: 57.4 ml/min  Best Practices: TPN Access: Placed 7/11 TPN start date:7/11  Nutritional Goals (per RD recommendation on 7/11): VFIE:3329 kcal Protein: 96g  Current Nutrition: Clinimix E 5/15 at 80 ml/hr + 20% ILE at 20 ml/hr over 12 hours.   Plan:   Will continue Clinimix E5/15 @ 80 ml/hr with MVI and trace elements and 20% ILE @ 83ml/hr over 12 hours.Will increase SSI to moderate scale.     Larene Beach [facility] Clinical Pharmacist 03/11/2017 1:54 PM

## 2017-03-12 LAB — TYPE AND SCREEN
ABO/RH(D): O POS
Antibody Screen: NEGATIVE
UNIT DIVISION: 0

## 2017-03-12 LAB — CBC
HCT: 23.4 % — ABNORMAL LOW (ref 35.0–47.0)
Hemoglobin: 7.6 g/dL — ABNORMAL LOW (ref 12.0–16.0)
MCH: 26.9 pg (ref 26.0–34.0)
MCHC: 32.7 g/dL (ref 32.0–36.0)
MCV: 82.4 fL (ref 80.0–100.0)
PLATELETS: 274 10*3/uL (ref 150–440)
RBC: 2.84 MIL/uL — AB (ref 3.80–5.20)
RDW: 17.5 % — ABNORMAL HIGH (ref 11.5–14.5)
WBC: 11.9 10*3/uL — AB (ref 3.6–11.0)

## 2017-03-12 LAB — GLUCOSE, CAPILLARY
GLUCOSE-CAPILLARY: 171 mg/dL — AB (ref 65–99)
GLUCOSE-CAPILLARY: 176 mg/dL — AB (ref 65–99)
GLUCOSE-CAPILLARY: 142 mg/dL — AB (ref 65–99)
Glucose-Capillary: 164 mg/dL — ABNORMAL HIGH (ref 65–99)
Glucose-Capillary: 142 mg/dL — ABNORMAL HIGH (ref 65–99)

## 2017-03-12 LAB — COMPREHENSIVE METABOLIC PANEL
ALBUMIN: 2.4 g/dL — AB (ref 3.5–5.0)
ALK PHOS: 67 U/L (ref 38–126)
ALT: 97 U/L — ABNORMAL HIGH (ref 14–54)
AST: 17 U/L (ref 15–41)
Anion gap: 5 (ref 5–15)
BUN: 24 mg/dL — ABNORMAL HIGH (ref 6–20)
CHLORIDE: 108 mmol/L (ref 101–111)
CO2: 26 mmol/L (ref 22–32)
Calcium: 8.7 mg/dL — ABNORMAL LOW (ref 8.9–10.3)
Creatinine, Ser: 0.7 mg/dL (ref 0.44–1.00)
GFR calc non Af Amer: >60 mL/min (ref >60)
GLUCOSE: 215 mg/dL — AB (ref 65–99)
POTASSIUM: 4 mmol/L (ref 3.5–5.1)
SODIUM: 139 mmol/L (ref 135–145)
Total Bilirubin: 0.7 mg/dL (ref 0.3–1.2)
Total Protein: 5.8 g/dL — ABNORMAL LOW (ref 6.5–8.1)

## 2017-03-12 LAB — DIFFERENTIAL
BASOS ABS: 0.1 10*3/uL (ref 0–0.1)
Basophils Relative: 1 %
EOS PCT: 5 %
Eosinophils Absolute: 0.6 10*3/uL (ref 0–0.7)
LYMPHS ABS: 1.4 10*3/uL (ref 1.0–3.6)
LYMPHS PCT: 11 %
Monocytes Absolute: 1.1 10*3/uL — ABNORMAL HIGH (ref 0.2–0.9)
Monocytes Relative: 9 %
NEUTROS PCT: 74 %
Neutro Abs: 8.8 10*3/uL — ABNORMAL HIGH (ref 1.4–6.5)

## 2017-03-12 LAB — PHOSPHORUS: PHOSPHORUS: 4.2 mg/dL (ref 2.5–4.6)

## 2017-03-12 LAB — BPAM RBC
BLOOD PRODUCT EXPIRATION DATE: 201807242359
ISSUE DATE / TIME: 201807161139
Unit Type and Rh: 5100

## 2017-03-12 LAB — MAGNESIUM: MAGNESIUM: 1.9 mg/dL (ref 1.7–2.4)

## 2017-03-12 LAB — PREALBUMIN: Prealbumin: 8 mg/dL — ABNORMAL LOW (ref 18–38)

## 2017-03-12 LAB — TRIGLYCERIDES: TRIGLYCERIDES: 57 mg/dL (ref <150)

## 2017-03-12 MED ORDER — ASPIRIN EC 81 MG PO TBEC
81.0000 mg | DELAYED_RELEASE_TABLET | Freq: Every day | ORAL | Status: DC
Start: 2017-03-12 — End: 2017-03-13
  Administered 2017-03-12 – 2017-03-13 (×2): 81 mg via ORAL
  Filled 2017-03-12 (×2): qty 1

## 2017-03-12 MED ORDER — ENSURE ENLIVE PO LIQD
237.0000 mL | Freq: Three times a day (TID) | ORAL | Status: DC
  Administered 2017-03-12 – 2017-03-13 (×3): 237 mL via ORAL

## 2017-03-12 MED ORDER — FAT EMULSION 20 % IV EMUL
250.0000 mL | INTRAVENOUS | Status: DC
  Administered 2017-03-12: 250 mL via INTRAVENOUS
  Filled 2017-03-12: qty 250

## 2017-03-12 MED ORDER — MORPHINE SULFATE (PF) 2 MG/ML IV SOLN
2.0000 mg | INTRAVENOUS | Status: DC | PRN
  Administered 2017-03-12: 2 mg via INTRAVENOUS
  Filled 2017-03-12: qty 1

## 2017-03-12 MED ORDER — TRACE MINERALS CR-CU-MN-SE-ZN 10-1000-500-60 MCG/ML IV SOLN
INTRAVENOUS | Status: DC
  Administered 2017-03-12: 18:00:00 via INTRAVENOUS
  Filled 2017-03-12: qty 960

## 2017-03-12 MED ORDER — PANTOPRAZOLE SODIUM 40 MG PO TBEC
40.0000 mg | DELAYED_RELEASE_TABLET | Freq: Every day | ORAL | Status: DC
  Administered 2017-03-12 – 2017-03-13 (×2): 40 mg via ORAL
  Filled 2017-03-12 (×2): qty 1

## 2017-03-12 MED ORDER — FLUTICASONE PROPIONATE 50 MCG/ACT NA SUSP
2.0000 | Freq: Every day | NASAL | Status: DC
  Administered 2017-03-12 – 2017-03-13 (×2): 2 via NASAL
  Filled 2017-03-12: qty 16

## 2017-03-12 MED ORDER — GLIPIZIDE ER 2.5 MG PO TB24
2.5000 mg | ORAL_TABLET | Freq: Every day | ORAL | Status: DC
  Administered 2017-03-12 – 2017-03-13 (×2): 2.5 mg via ORAL
  Filled 2017-03-12 (×2): qty 1

## 2017-03-12 MED ORDER — ATORVASTATIN CALCIUM 20 MG PO TABS
10.0000 mg | ORAL_TABLET | Freq: Every day | ORAL | Status: DC
  Administered 2017-03-12: 10 mg via ORAL
  Filled 2017-03-12: qty 1

## 2017-03-12 NOTE — Progress Notes (Signed)
4 Days Post-Op   Subjective:  Patient reports doing very well. Tolerating her diet and is having bowel function. Pain is well controlled  Vital signs in last 24 hours: Temp:  [98.3 F (36.8 C)-99.1 F (37.3 C)] 99.1 F (37.3 C) (07/17 0808) Pulse Rate:  [84-95] 93 (07/17 0808) Resp:  [16-18] 16 (07/17 0808) BP: (89-127)/(32-53) 121/53 (07/17 0808) SpO2:  [99 %-100 %] 100 % (07/17 0808) Weight:  [75.1 kg (165 lb 8 oz)] 75.1 kg (165 lb 8 oz) (07/17 0500) Last BM Date: 03/05/17  Intake/Output from previous day: 07/16 0701 - 07/17 0700 In: 4561 [P.O.:1020; I.V.:2970; Blood:280] Out: 525 [Urine:525]  GI: Dressing removed today. Abdomen is soft, approrpirately tender to palpation, midilne well approximated without evidence of infection or drainage  Lab Results:  CBC  Recent Labs  03/10/17 0500 03/11/17 0417 03/11/17 1557  WBC 14.5* 13.2*  --   HGB 7.0* 5.9* 7.2*  HCT 21.9* 18.2*  --   PLT 270 250  --    CMP     Component Value Date/Time   NA 138 03/11/2017 0417   NA 133 (L) 04/08/2014 1738   K 3.7 03/11/2017 0417   K 4.2 04/08/2014 1738   CL 106 03/11/2017 0417   CL 99 04/08/2014 1738   CO2 28 03/11/2017 0417   CO2 28 04/08/2014 1738   GLUCOSE 182 (H) 03/11/2017 0417   GLUCOSE 116 (H) 04/08/2014 1738   BUN 30 (H) 03/11/2017 0417   BUN 5 (L) 04/08/2014 1738   CREATININE 0.80 03/11/2017 0417   CREATININE 0.42 (L) 04/08/2014 1738   CALCIUM 8.1 (L) 03/11/2017 0417   CALCIUM 9.5 04/08/2014 1738   PROT 6.6 03/06/2017 1808   ALBUMIN 2.1 (L) 03/11/2017 0417   AST 21 03/06/2017 1808   ALT 12 (L) 03/06/2017 1808   ALKPHOS 67 03/06/2017 1808   BILITOT 0.7 03/06/2017 1808   GFRNONAA >60 03/11/2017 0417   GFRNONAA >60 04/08/2014 1738   GFRAA >60 03/11/2017 0417   GFRAA >60 04/08/2014 1738   PT/INR No results for input(s): LABPROT, INR in the last 72 hours.  Studies/Results: No results found.  Assessment/Plan: 70 year old female status post Roux-en-Y bypass of  obstructing metastatic cancer. Doing well. Advance diet as tolerated. Agree with weaning off TPN. Doing well from a surgical standpoint. Encourage ambulation and incentive spirometer usage.   Clayburn Pert, MD [facility]  Day [facility] [phone] Night [facility] [phone]  03/12/2017

## 2017-03-12 NOTE — Progress Notes (Signed)
PHARMACY - ADULT TOTAL PARENTERAL NUTRITION CONSULT NOTE   Pharmacy Consult for TPN  Indication: Bowel obstruction  Patient Measurements: Height: 5\' 2"  (157.5 cm) Weight: 165 lb 8 oz (75.1 kg) IBW/kg (Calculated) : 50.1 TPN AdjBW (KG): 54.8 Body mass index is 30.27 kg/m.  Assessment: 71 yo female with functional duodenal obstruction with gastric distention.  Pt is at risk for refeeding syndrome.   GI:  Endo:  Insulin requirements in the past 24 hours: 13 units Lytes:K+: 3.7 Mg: 1.8  Phos:  3.0 Renal:Scr: 0.87, CrCl: 57.4 ml/min  Best Practices: TPN Access: Placed 7/11 TPN start date:7/11  Nutritional Goals (per RD recommendation on 7/11): PVVZ:4827 kcal Protein: 96g  Current Nutrition: Clinimix E 5/15 at 80 ml/hr + 20% ILE at 20 ml/hr over 12 hours.   Plan:   Weaning patient off TPN tomorrow. Spoke with RD, would like to reduce rate from 80 to 40 ml/hr. Will place order for Clinimix E 5/15 @ 40 ml/hr with 10 ml of MVI and trace elements added. Also will order 20$ ILE @ 20 ml/hr over 12 hours.    Larene Beach [facility] Clinical Pharmacist 03/12/2017 1:26 PM

## 2017-03-12 NOTE — Plan of Care (Signed)
Problem: Education: Goal: Knowledge of [facility] Education information/materials will improve Outcome: Progressing Hypotensive during shift, stable at recheck.  Dr. Marcille Blanco aware.  VSS otherwise.  Reported back pain 7/10, improved w/ PRN IV Morphine 2mg  x2.  No other needs overnight.  Refused AM lab draw, pt stated labs can be drawn from line after TPN stops.  Pt aware that delay in labs may delay results being seen by doctors, still refuses draw.  Bed in low position, call bell within reach.  WCTM.

## 2017-03-12 NOTE — Progress Notes (Addendum)
Physical Therapy Treatment Patient Details Name: Sophia Nelson MRN: [id number] DOB: 1946-01-09 Today's Date: 03/12/2017    History of Present Illness Sophia Nelson is a 71 y.o. female with a past medical history of arthritis, COPD, gastric reflux, diabetes, hypertension, presents to the emergency department with chest pain. Patient was admitted to the hospital from 02/24/17 - 02/26/17 for abdominal pain / nausea found to have gastroparesis/ outlet obstruction.  Abdominal pain and nausea have been well controlled however; she sought medical attention today when she developed central chest pain associated with indigestion. Pain is central/midepigastric and burning in nature.   No diaphoresis, palpitations, SOB.  Also complains of 3 days of no bowel movement and minimal flatus. She is now admitted for SBO with gastric obstruction and is currently being managed with NG tube. Pt is now s/p segmental jejunal small bowel resection for accessible tumor pathology.    PT Comments    Ms. Timmons presents similar to that of her initial PT Evaluation.  Educated pt on log roll technique for bed mobility and educated pt on the importance of practicing upright posture for improved abdominal healing.  She presents with guarded posture due to abdominal pain and chronic LBP with her LBP ultimately limiting her ambulatory distance today.  Pt reports that she will have good family support at d/c to provide assist/supervision.  Pt will benefit from continued skilled PT services to increase functional independence and safety.   Follow Up Recommendations  Home health PT     Equipment Recommendations  Rolling walker with 5" wheels    Recommendations for Other Services       Precautions / Restrictions Precautions Precautions: Fall Precaution Comments: abdominal precautions; introduced log roll technique today Restrictions Weight Bearing Restrictions: No    Mobility  Bed Mobility Overal bed mobility: Needs  Assistance Bed Mobility: Sidelying to Sit   Sidelying to sit: Min guard;HOB elevated       General bed mobility comments: Pt lying in L sidelying upon PT arrival.  Increased effort and time required.  Pt instructed in log roll technique.  No physical assist required but min guard for safety.  Transfers Overall transfer level: Needs assistance Equipment used: Rolling walker (2 wheeled) Transfers: Sit to/from Stand Sit to Stand: Min guard         General transfer comment: Pt rises slowly and requires cues for proper hand placement.  Cues provided for upright posture once standing.  Cues to reach back for armrests or bed when preparing to sit.  Ambulation/Gait Ambulation/Gait assistance: Min guard Ambulation Distance (Feet): 60 Feet Assistive device: Rolling walker (2 wheeled) Gait Pattern/deviations: Decreased step length - right;Decreased step length - left;Trunk flexed Gait velocity: decreased Gait velocity interpretation: Below normal speed for age/gender General Gait Details: Cues for upright posture with gentle stretch and for forward gaze.  Cues for porper managment and positioning of RW as pt has tendency to push it too far ahead.  Pt's ambulatory distance limited due to LBP.   Stairs            Wheelchair Mobility    Modified Rankin (Stroke Patients Only)       Balance Overall balance assessment: Needs assistance Sitting-balance support: No upper extremity supported Sitting balance-Leahy Scale: Good     Standing balance support: Bilateral upper extremity supported Standing balance-Leahy Scale: Fair Standing balance comment: Pt able to stand statically without UE support but requires UE support for dynamic activities  Cognition Arousal/Alertness: Awake/alert Behavior During Therapy: WFL for tasks assessed/performed Overall Cognitive Status: Within Functional Limits for tasks assessed                                         Exercises General Exercises - Lower Extremity Ankle Circles/Pumps: AROM;Both;10 reps;Seated Long Arc Quad: AROM;Both;10 reps;Seated Other Exercises Other Exercises: Encouraged pt to ambulate with nursing staff in hallway at least 2x/day Other Exercises: Instructed pt to be ambulate in home every 45 minutes and to not stay stationary during the day once she d/c home.    General Comments General comments (skin integrity, edema, etc.): SpO2 remains at or above 98% on 2L O2 throughout session.   Did not complete formal Re-Eval as pt's functional strength and mobility is similar to that at time of initial PT Evaluation.      Pertinent Vitals/Pain Pain Assessment: Faces Faces Pain Scale: Hurts whole lot Pain Location: abdomen and chronic LBP Pain Descriptors / Indicators: Sore;Aching;Grimacing;Guarding Pain Intervention(s): Limited activity within patient's tolerance;Monitored during session;Premedicated before session    Home Living                      Prior Function            PT Goals (current goals can now be found in the care plan section) Acute Rehab PT Goals Patient Stated Goal: "I really want to go home" PT Goal Formulation: With patient Time For Goal Achievement: 03/19/17 Potential to Achieve Goals: Good Progress towards PT goals: Progressing toward goals    Frequency    Min 2X/week      PT Plan Current plan remains appropriate    Co-evaluation              AM-PAC PT "6 Clicks" Daily Activity  Outcome Measure  Difficulty turning over in bed (including adjusting bedclothes, sheets and blankets)?: A Little Difficulty moving from lying on back to sitting on the side of the bed? : A Little Difficulty sitting down on and standing up from a chair with arms (e.g., wheelchair, bedside commode, etc,.)?: A Little Help needed moving to and from a bed to chair (including a wheelchair)?: A Little Help needed walking in hospital room?:  A Little Help needed climbing 3-5 steps with a railing? : A Little 6 Click Score: 18    End of Session Equipment Utilized During Treatment: Gait belt;Oxygen Activity Tolerance: Patient tolerated treatment well;Patient limited by pain Patient left: with call bell/phone within reach;in bed;with nursing/sitter in room;Other (comment) (seated EOB talking on the phone (Nurse Tech in room)) Nurse Communication: Other (comment);Mobility status (Nurse tech oferred to assist pt back to bed after vitals) PT Visit Diagnosis: Unsteadiness on feet (R26.81);Muscle weakness (generalized) (M62.81)     Time: 7544-9201 PT Time Calculation (min) (ACUTE ONLY): 33 min  Charges:  $Gait Training: 8-22 mins $Therapeutic Activity: 8-22 mins                    G Codes:       Collie Siad PT, DPT 03/12/2017, 1:06 PM

## 2017-03-12 NOTE — Progress Notes (Signed)
[employer] [facility] [place] NAME: Sophia Nelson    MR#:  [id number]  DATE OF BIRTH:  Sep 19, 1945  SUBJECTIVE:  Tolerating CLD Weak TPN going well Sitting out in the chair  REVIEW OF SYSTEMS:   Review of Systems  Constitutional: Negative for chills, fever and weight loss.  HENT: Negative for ear discharge, ear pain and nosebleeds.   Eyes: Negative for blurred vision, pain and discharge.  Respiratory: Negative for sputum production, shortness of breath, wheezing and stridor.   Cardiovascular: Negative for chest pain, palpitations, orthopnea and PND.  Gastrointestinal: Negative for abdominal pain, diarrhea, nausea and vomiting.  Genitourinary: Negative for frequency and urgency.  Musculoskeletal: Negative for back pain and joint pain.  Neurological: Positive for weakness. Negative for sensory change, speech change and focal weakness.  Psychiatric/Behavioral: Negative for depression and hallucinations. The patient is not nervous/anxious.    Tolerating Diet:CLD Tolerating PT: pending  DRUG ALLERGIES:   Allergies  Allergen Reactions  . Other     Onions(that grow in the yard-pt can eat onions without problems) and dust mite Sneezing, cough, runny nose    VITALS:  Blood pressure (!) 121/53, pulse 93, temperature 99.1 F (37.3 C), temperature source Oral, resp. rate 16, height 5\' 2"  (1.575 m), weight 75.1 kg (165 lb 8 oz), SpO2 100 %.  PHYSICAL EXAMINATION:   Physical Exam  GENERAL:  71 y.o.-year-old patient lying in the bed with no acute distress. Pallor+ EYES: Pupils equal, round, reactive to light and accommodation. No scleral icterus. Extraocular muscles intact.  HEENT: Head atraumatic, normocephalic. Oropharynx and nasopharynx clear.  NECK:  Supple, no jugular venous distention. No thyroid enlargement, no tenderness.  LUNGS: Normal breath sounds bilaterally, no wheezing, rales, rhonchi. No use of accessory muscles of respiration.   CARDIOVASCULAR: S1, S2 normal. No murmurs, rubs, or gallops.  ABDOMEN: Soft, nontender, nondistended. Bowel sounds present. No organomegaly or mass. Midline surgical dressing EXTREMITIES: No cyanosis, clubbing or edema b/l.    NEUROLOGIC: Cranial nerves II through XII are intact. No focal Motor or sensory deficits b/l.   PSYCHIATRIC:  patient is alert and oriented x 3.  SKIN: No obvious rash, lesion, or ulcer.   LABORATORY PANEL:  CBC  Recent Labs Lab 03/11/17 0417 03/11/17 1557  WBC 13.2*  --   HGB 5.9* 7.2*  HCT 18.2*  --   PLT 250  --     Chemistries   Recent Labs Lab 03/06/17 1808  03/11/17 0417  NA  --   < > 138  K  --   < > 3.7  CL  --   < > 106  CO2  --   < > 28  GLUCOSE  --   < > 182*  BUN  --   < > 30*  CREATININE  --   < > 0.80  CALCIUM  --   < > 8.1*  MG  --   < > 1.9  AST 21  --   --   ALT 12*  --   --   ALKPHOS 67  --   --   BILITOT 0.7  --   --   < > = values in this interval not displayed. Cardiac Enzymes No results for input(s): TROPONINI in the last 168 hours. RADIOLOGY:  No results found. ASSESSMENT AND PLAN:   Sophia Ebron Johnsonis a 71 y.o.femalewith a past medical history of arthritis, COPD, gastric reflux, diabetes, hypertension, presents to the emergency department with  chest pain. Patient was admitted to the hospital from 02/24/17 - 02/26/17 for abdominal pain / nausea found to have gastroparesis/ outlet obstruction.  Abdominal pain and nausea have been well controlled however she sought medical attention today when she developed central chest pain associated with indigestion.  1. Duodenal obstruction with gastric distention. S/p surgery POD #3 . NG tube Removed. TPN for nutrition. Patient started on clear liquid to full liquids today. Per surgery wean her TPN to off by tomorrow Anemia. Hemoglobin has drifted down. 7.0 --5.9--1 unit BT on 03/12/17--7.2  2. Type 2 diabetes mellitus. Sugars are starting to increase on the TPN.  3.Hypokalemia/  Hypomagnesemia replaced  4. Metastatic breast cancer L3 bone. - On letrozole and palbociclib.  Case discussed with oncology. The patient has a history of metastatic breast cancer. Further workup after recovery from surgery  5.GERD on Protonix  6.COPD - on Spiriva -sats table on 2 liter -- chronic  PT to see pt  Case discussed with Care Management/Social Worker. Management plans discussed with the patient, family and they are in agreement.  CODE STATUS: full  DVT Prophylaxis: enoxaparin  TOTAL TIME TAKING CARE OF THIS PATIENT: *25* minutes.  >50% time spent on counselling and coordination of care  POSSIBLE D/C IN 1-2 DAYS, DEPENDING ON CLINICAL CONDITION.  Note: This dictation was prepared with Dragon dictation along with smaller phrase technology. Any transcriptional errors that result from this process are unintentional.  Sophia Nelson M.D on 03/12/2017 at 9:46 AM  Between 7am to 6pm - Pager - [phone]  After 6pm go to [email or web address] - password [email or web address]  Office  [phone]  CC: Primary care physician; Donnie Coffin, MD

## 2017-03-13 ENCOUNTER — Inpatient Hospital Stay: Payer: Medicare HMO

## 2017-03-13 ENCOUNTER — Inpatient Hospital Stay: Payer: Medicare HMO | Admitting: Internal Medicine

## 2017-03-13 DIAGNOSIS — M545 Low back pain: Secondary | ICD-10-CM

## 2017-03-13 DIAGNOSIS — M899 Disorder of bone, unspecified: Secondary | ICD-10-CM

## 2017-03-13 LAB — GLUCOSE, CAPILLARY
GLUCOSE-CAPILLARY: 130 mg/dL — AB (ref 65–99)
Glucose-Capillary: 91 mg/dL (ref 65–99)
Glucose-Capillary: 128 mg/dL — ABNORMAL HIGH (ref 65–99)
Glucose-Capillary: 104 mg/dL — ABNORMAL HIGH (ref 65–99)

## 2017-03-13 LAB — SURGICAL PATHOLOGY

## 2017-03-13 LAB — ABO/RH: ABO/RH(D): O POS

## 2017-03-13 MED ORDER — ENSURE ENLIVE PO LIQD: 237.0000 mL | Freq: Three times a day (TID) | ORAL | 12 refills | Status: AC

## 2017-03-13 MED ORDER — METOCLOPRAMIDE HCL 10 MG PO TABS: 10.0000 mg | ORAL_TABLET | Freq: Three times a day (TID) | ORAL | 1 refills | Status: DC | PRN

## 2017-03-13 MED ORDER — POLYETHYLENE GLYCOL 3350 17 G PO PACK
17.0000 g | PACK | Freq: Every day | ORAL | Status: DC
  Administered 2017-03-13: 17 g via ORAL
  Filled 2017-03-13: qty 1

## 2017-03-13 MED ORDER — OXYCODONE-ACETAMINOPHEN 5-325 MG PO TABS: 1.0000 | ORAL_TABLET | ORAL | 0 refills | Status: DC | PRN

## 2017-03-13 NOTE — Progress Notes (Signed)
Home medication returned to patient from pharmacy.   Discharge instructions given and went over with patient at bedside. Prescriptions reviewed. All questions answered. Patient discharged home with son via wheelchair by nursing staff. Madlyn Frankel, RN

## 2017-03-13 NOTE — Progress Notes (Signed)
Patient to discharge around 1700 when son is available for transport. Madlyn Frankel, RN

## 2017-03-13 NOTE — Progress Notes (Signed)
.   Sophia Nelson   DOB:Mar 01, 1946   UX#:[id number]    Subjective: Patient states that she has been on liquid diet tolerating well. She eager to go home. Denies any shortness of breath or chest pain. Complains of pain in the lower back.  She has been working with physical therapy.  ROS: no constipation or diarrhea. No fevers.   Objective:  Vitals:   03/12/17 1944 03/13/17 0400  BP: (!) 126/44 (!) 104/56  Pulse: 96 94  Resp: 20 20  Temp: 99.1 F (37.3 C) 98.8 F (37.1 C)     Intake/Output Summary (Last 24 hours) at 03/13/17 0838 Last data filed at 03/13/17 9166  Gross per 24 hour  Intake             1768 ml  Output              300 ml  Net             1468 ml    GENERAL: Well-nourished well-developed; Alert, mild distress and comfortable.   Alone.Sitting but the edge of the bed. EYES: no pallor or icterus OROPHARYNX: no thrush or ulceration. NECK: supple, no masses felt LYMPH:  no palpable lymphadenopathy in the cervical, axillary or inguinal regions LUNGS: decreased breath sounds to auscultation at bases and  No wheeze or crackles HEART/CVS: regular rate & rhythm and no murmurs; No lower extremity edema ABDOMEN: abdomen soft, tender and distended. Musculoskeletal:no cyanosis of digits and no clubbing  PSYCH: alert & oriented x 3 with fluent speech NEURO: no focal motor/sensory deficits SKIN:  no rashes or significant lesions Labs:  Lab Results  Component Value Date   WBC 11.9 (H) 03/12/2017   HGB 7.6 (L) 03/12/2017   HCT 23.4 (L) 03/12/2017   MCV 82.4 03/12/2017   PLT 274 03/12/2017   NEUTROABS 8.8 (H) 03/12/2017    Lab Results  Component Value Date   NA 139 03/12/2017   K 4.0 03/12/2017   CL 108 03/12/2017   CO2 26 03/12/2017    Studies:  No results found.  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 Pathology pending.  # Metastatic lobular cancer of the breast- on letrozole+ Ibrance [currently on HOLD];Given the progression- would consider changing therapy to Afinitor-Aromasin.   #  Lesion noted at L2/lytic-Causing pain. Discussed regarding MRI in the hospital. Patient declines- she wants to have it done outpatient. Would recommend evaluation with radiation oncology-outpatient.  # Anemia-hemoglobin 7-multifactorial. status post transfusion.   # Disposition- patient is eager to go home/ defer to primary service.    [name], MD 03/13/2017  8:38 AM

## 2017-03-13 NOTE — Plan of Care (Signed)
Problem: Education: Goal: Knowledge of [facility] Education information/materials will improve Outcome: Progressing VSS, free of falls during shift.  Reports pain well-controlled, no needs overnight.  No BM during shift, reports she feels like bowels will be moving soon.  Bed in low position, BSC at bedside.  Call bell within reach, [place].

## 2017-03-13 NOTE — Progress Notes (Signed)
5 Days Post-Op   Subjective:  Patient is without active complaints this morning. Tolerating her diet and states she's feeling well.  Vital signs in last 24 hours: Temp:  [98.1 F (36.7 C)-99.1 F (37.3 C)] 98.8 F (37.1 C) (07/18 0400) Pulse Rate:  [91-96] 94 (07/18 0400) Resp:  [16-20] 20 (07/18 0400) BP: (104-126)/(44-59) 104/56 (07/18 0400) SpO2:  [97 %-98 %] 98 % (07/18 0400) Weight:  [75.3 kg (166 lb 1.6 oz)] 75.3 kg (166 lb 1.6 oz) (07/18 0500) Last BM Date: 03/05/17  Intake/Output from previous day: 07/17 0701 - 07/18 0700 In: 1768 [I.V.:1768] Out: 300 [Urine:300]  GI: Abdomen is soft, appropriately tender to palpation at the incision site, nondistended. Staples in the midline are intact without any evidence of erythema or drainage.  Lab Results:  CBC  Recent Labs  03/11/17 0417 03/11/17 1557 03/12/17 1108  WBC 13.2*  --  11.9*  HGB 5.9* 7.2* 7.6*  HCT 18.2*  --  23.4*  PLT 250  --  274   CMP     Component Value Date/Time   NA 139 03/12/2017 1108   NA 133 (L) 04/08/2014 1738   K 4.0 03/12/2017 1108   K 4.2 04/08/2014 1738   CL 108 03/12/2017 1108   CL 99 04/08/2014 1738   CO2 26 03/12/2017 1108   CO2 28 04/08/2014 1738   GLUCOSE 215 (H) 03/12/2017 1108   GLUCOSE 116 (H) 04/08/2014 1738   BUN 24 (H) 03/12/2017 1108   BUN 5 (L) 04/08/2014 1738   CREATININE 0.70 03/12/2017 1108   CREATININE 0.42 (L) 04/08/2014 1738   CALCIUM 8.7 (L) 03/12/2017 1108   CALCIUM 9.5 04/08/2014 1738   PROT 5.8 (L) 03/12/2017 1108   ALBUMIN 2.4 (L) 03/12/2017 1108   AST 17 03/12/2017 1108   ALT 97 (H) 03/12/2017 1108   ALKPHOS 67 03/12/2017 1108   BILITOT 0.7 03/12/2017 1108   GFRNONAA >60 03/12/2017 1108   GFRNONAA >60 04/08/2014 1738   GFRAA >60 03/12/2017 1108   GFRAA >60 04/08/2014 1738   PT/INR No results for input(s): LABPROT, INR in the last 72 hours.  Studies/Results: No results found.  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 Doing very well. Discussed the care with the primary team in that it is likely okay for discharge today. Arranged for follow-up in the surgery clinic next week for a wound check and staple removal. Please call again if the Gen. surgery services can be of any further assistance with his hospital stay.   Clayburn Pert, MD [facility]  Day [facility] [phone] Night [facility] [phone]  03/13/2017

## 2017-03-13 NOTE — Care Management (Signed)
Discharge to home today per Dr. Posey Pronto. Discussed home health agencies with Ms. Cales at the bedside. [place]. Floydene Flock, [facility] representative updated. [facility] will provide bedside commode and rolling walker. Family will transport. Shelbie Ammons RN MSN CCM Care Management [phone]

## 2017-03-13 NOTE — Care Management Important Message (Signed)
Important Message  Patient Details  Name: Sophia Nelson MRN: [id number] Date of Birth: Dec 17, 1945   Medicare Important Message Given:  Yes    Shelbie Ammons, RN 03/13/2017, 8:11 AM

## 2017-03-13 NOTE — Discharge Summary (Signed)
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 Anemia   . Arthritis   . Asthma   . Breast cancer ([place]) 2009   RT MASTECTOMY, DCIS  . Cancer of left breast ([name]) 08/09/2015   T4 N1 M1 tumor, INVASIVE LOBULAR CARCINOMA.  [name] COPD (chronic obstructive pulmonary disease) ([place])   . Diabetes mellitus without complication ([place])   . GERD (gastroesophageal reflux disease)   . Headache    h/o migraines as a child  . Hypertension   . Pneumonia 2015  . Shortness of breath dyspnea    with exertion    HOSPITAL COURSE:   Sophia Lahmann Johnsonis a 71 y.o.femalewith a past medical history of arthritis, COPD, gastric reflux, diabetes, hypertension, presents to the emergency department with chest pain. Patient wasadmitted to the hospital from 02/24/17 -02/26/17 for abdominal pain / nausea found to have gastroparesis/ outlet obstruction. Abdominal pain and nausea have been well controlled however she sought medical attention today when she developed central chest pain associated with indigestion.   1.duodenal obstruction with gastric distention. S/p Roux-en-Y bypass of obstructing metastatic breast cancer  POD #4 . NG tube  Removed.  patient received TPN for nutrition.  now tolerating full liquid diet which she'll continue for the next 3-4 days and then advance to soft diet.   2. Anemia acute on chronic. Hemoglobin 7.6 at discharge  3. Type 2 diabetes mellitus. Sugars are starting to increase on the TPN. Resume glipizide 3. Hypokalemia. replaced 4. Hypomagnesemia replaced 5. Metastatic breast cancer L3 bone. On letrozole and palbociclib.  The patient has a history of metastatic breast cancer. Patient will follow with Dr. Yevette Nelson at the cancer center. 6. GERD on Protonix 7. COPD on Spiriva 8. Hypertension patient had relative low/soft  BP, she was asymptomatic, hydrochlorothiazide and lisinopril discontinued at discharge.  Physical therapy recommended home PT which will be arranged. Bedside commode and rolling walker prescribed  Discharged to home with outpatient surgery and oncology follow-up CONSULTS OBTAINED:  Treatment Team:  Sophia Epley, MD Sophia Sickle, MD  DRUG ALLERGIES:   Allergies  Allergen Reactions  . Other     Onions(that grow in the yard-pt can eat onions without problems) and dust mite Sneezing, cough, runny nose    DISCHARGE MEDICATIONS:   Current Discharge Medication List    START taking these medications   Details  feeding supplement, ENSURE ENLIVE, (ENSURE ENLIVE) LIQD Take 237 mLs by mouth 3 (three) times daily between meals. Qty: 237 mL, Refills: 12      CONTINUE these medications which have CHANGED   Details  metoCLOPramide (REGLAN) 10 MG tablet Take 1 tablet (10 mg total) by mouth every 8 (eight) hours as needed for nausea. Qty: 90 tablet, Refills: 1    oxyCODONE-acetaminophen (PERCOCET/ROXICET) 5-325 MG tablet Take 1 tablet by mouth  every 4 (four) hours as needed for severe pain. Qty: 25 tablet, Refills: 0      CONTINUE these medications which have NOT CHANGED   Details  ADVAIR DISKUS 500-50 MCG/DOSE AEPB Inhale 1 puff into the lungs 2 (two)  times daily.     albuterol (PROVENTIL HFA;VENTOLIN HFA) 108 (90 Base) MCG/ACT inhaler Inhale 2 puffs into the lungs every 6 (six) hours as needed for wheezing or shortness of breath.    aspirin 81 MG tablet Take 81 mg by mouth daily.    atorvastatin (LIPITOR) 10 MG tablet Take 10 mg by mouth daily.    docusate sodium (COLACE) 100 MG capsule Take 100 mg by mouth 2 (two) times daily.    fluticasone (FLONASE) 50 MCG/ACT nasal spray Place 2 sprays into both nostrils daily.    GLIPIZIDE XL 2.5 MG 24 hr tablet Take 2.5 mg by mouth daily with breakfast.     letrozole (FEMARA) 2.5 MG tablet Take 1 tablet (2.5 mg total) by mouth daily. Qty: 90 tablet, Refills: 4   Associated Diagnoses: Carcinoma of overlapping sites of left breast in female, estrogen receptor positive (HCC)    loratadine (CLARITIN) 10 MG tablet Take 10 mg by mouth daily.    montelukast (SINGULAIR) 10 MG tablet Take 10 mg by mouth at bedtime.    omeprazole (PRILOSEC) 20 MG capsule Take 20 mg by mouth every morning.     OXYGEN Inhale 3 L into the lungs continuous.     palbociclib (IBRANCE) 125 MG capsule Take 125 mg by mouth daily with lunch.    Associated Diagnoses: Cancer of overlapping sites of left female breast ([place]); Neck pain    SPIRIVA HANDIHALER 18 MCG inhalation capsule Place 18 mcg into inhaler and inhale every morning.       STOP taking these medications     hydrochlorothiazide (HYDRODIURIL) 12.5 MG tablet      lisinopril (PRINIVIL,ZESTRIL) 40 MG tablet         If you experience worsening of your admission symptoms, develop shortness of breath, life threatening emergency, suicidal or homicidal thoughts you must seek medical attention immediately by calling 911 or calling your MD immediately  if symptoms less severe.  You Must read complete instructions/literature along with all the possible adverse reactions/side effects for all the Medicines you take and that have been prescribed to you. Take any new  Medicines after you have completely understood and accept all the possible adverse reactions/side effects.   Please note  You were cared for by a hospitalist during your hospital stay. If you have any questions about your discharge medications or the care you received while you were in the hospital after you are discharged, you can call the unit and asked to speak with the hospitalist on call if the hospitalist that took care of you is not available. Once you are discharged, your primary care physician will handle any further medical issues. Please note that NO REFILLS for any discharge medications will be authorized once you are discharged, as it is imperative that you return to your primary care physician (or establish a relationship with a primary care physician if you do not have one) for your aftercare needs so that they can reassess your need for medications and monitor your lab values. Today   SUBJECTIVE   Doing well. Tolerating full liquid  VITAL SIGNS:  Blood pressure (!) 104/56, pulse 94, temperature 98.8 F (37.1 C), temperature source Oral, resp. rate 20, height 5\' 2"  (1.575 m),  weight 75.3 kg (166 lb 1.6 oz), SpO2 98 %.  I/O:   Intake/Output Summary (Last 24 hours) at 03/13/17 0825 Last data filed at 03/13/17 4782  Gross per 24 hour  Intake             1768 ml  Output              300 ml  Net             1468 ml    PHYSICAL EXAMINATION:  GENERAL:  71 y.o.-year-old patient lying in the bed with no acute distress.  EYES: Pupils equal, round, reactive to light and accommodation. No scleral icterus. Extraocular muscles intact.  HEENT: Head atraumatic, normocephalic. Oropharynx and nasopharynx clear. Generalized pallor NECK:  Supple, no jugular venous distention. No thyroid enlargement, no tenderness.  LUNGS: Normal breath sounds bilaterally, no wheezing, rales,rhonchi or crepitation. No use of accessory muscles of respiration.  CARDIOVASCULAR: S1, S2 normal. No murmurs, rubs,  or gallops.  ABDOMEN: Soft, non-tender, non-distended. Bowel sounds present. No organomegaly or mass. Abdominal surgical staples present  EXTREMITIES: No pedal edema, cyanosis, or clubbing.  NEUROLOGIC: Cranial nerves II through XII are intact. Muscle strength 5/5 in all extremities. Sensation intact. Gait not checked.  PSYCHIATRIC: The patient is alert and oriented x 3.  SKIN: No obvious rash, lesion, or ulcer.   DATA REVIEW:   CBC   Recent Labs Lab 03/12/17 1108  WBC 11.9*  HGB 7.6*  HCT 23.4*  PLT 274    Chemistries   Recent Labs Lab 03/12/17 1108  NA 139  K 4.0  CL 108  CO2 26  GLUCOSE 215*  BUN 24*  CREATININE 0.70  CALCIUM 8.7*  MG 1.9  AST 17  ALT 97*  ALKPHOS 53  BILITOT 0.7    Microbiology Results   Recent Results (from the past 240 hour(s))  Urine Culture     Status: Abnormal   Collection Time: 03/05/17  9:50 AM  Result Value Ref Range Status   Specimen Description URINE, CLEAN CATCH  Final   Special Requests NONE  Final   Culture (A)  Final    <10,000 COLONIES/mL INSIGNIFICANT GROWTH Performed at [facility], [place] [address], [place], [place] [address]    Report Status 03/06/2017 FINAL  Final    RADIOLOGY:  No results found.   Management plans discussed with the patient, family and they are in agreement.  CODE STATUS:     Code Status Orders        Start     Ordered   03/04/17 0124  Full code  Continuous     03/04/17 0123    Code Status History    Date Active Date Inactive Code Status Order ID Comments User Context   02/24/2017 11:40 PM 02/26/2017  5:06 PM Full Code [id number]  Lance Coon, MD Inpatient      TOTAL TIME TAKING CARE OF THIS PATIENT: 40 minutes.    Alyssia Heese M.D on 03/13/2017 at 8:25 AM  Between 7am to 6pm - Pager - [phone] After 6pm go to [email or web address] - password [email or web address] [facility]  Office  [phone]  CC: Primary care physician; Sophia Coffin, MD

## 2017-03-13 NOTE — Progress Notes (Signed)
Nutrition Follow-up  DOCUMENTATION CODES:   Severe malnutrition in context of acute illness/injury  INTERVENTION:  Continue Ensure Enlive po TID, each supplement provides 350 kcal and 20 grams of protein. Provided patient with Ensure coupons and encouraged her to continue drinking at home to help meet calorie/protein needs.  Reviewed "Full Liquid Nutrition Therapy" with patient as this is the diet she will discharge on today. Patient is doing well progressing towards meeting her calorie needs. Discussed that she will have to be mindful in choosing full liquids with protein she can tolerate as she does not like milk or yogurt. Discussed importance of meeting protein and calorie needs, even while on full liquid diet. Provided patient with list of full liquids and oral nutrition supplements she can use to help meet her calorie and protein needs (soy milk, Ensure Enlive, Ensure Plus) and how to incorporate them into her diet. Wrote patient's daily calorie, protein, and fluid goals on top of handout.  Recommend complete multivitamin with iron BID (chewable or liquid), sublingual vitamin B12 350-500 micrograms daily, chewable calcium citrate with vitamin D 500 mg TID. For best absorption it is best to take MVI with iron at least 2 hours apart from calcium.  NUTRITION DIAGNOSIS:   Malnutrition (Severe) related to acute illness (small bowel obstruction) as evidenced by energy intake < or equal to 50% for > or equal to 5 days, percent weight loss.  Ongoing.  GOAL:   Patient will meet greater than or equal to 90% of their needs  Progressing.  MONITOR:   Diet advancement, Labs, Weight trends, I & O's  REASON FOR ASSESSMENT:   Consult New TPN/TNA  ASSESSMENT:   71 year old female with PMHx of HTN, DM type 2, GERD, COPD, hx right breast cancer s/p mastectomy in 2009, hx of left breast cancer s/p mastectomy in 2017, recent admission 7/1-7/3 with abdominal pain, N/V for gastric outlet  obstruction vs gastroparesis. 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  -NGT was removed 7/15. Pt advanced to CLD on 7/16 and FLD on 7/17. Per discussion with MD pt will remain on FLD for a few days after discharge before advancement. Will follow-up with surgeon after discharge. -TPN was reduced to 1/2 rate yesterday evening when new bag was put up. TPN was discontinued this morning. -Patient now has discharge orders.  Pt now POD#5 s/p gastrojejunostomy. Patient reports she is feeling good today. She is excited to discharge this afternoon. Reports she is tolerating her full liquid diet well. Denies any abdominal pain or nausea. She has still not had a bowel movement since before surgery (noted she was given Miralax today). Patient enjoys the Ensure and reports she can continue drinking them at home. Patient does not like milk or yogurt, so has limited options to meet protein needs on full liquid diet.  Meal Completion: 80-100% of FLD + 2 bottles of Ensure Enlive In the past 24 hrs patient has had approximately 1471 kcal (87% minimum estimated kcal needs) and 49 grams of protein (59% minimum estimated protein needs).  Medications reviewed and include: Novolog 0-15 units Q4hrs, pantoprazole, Miralax.  Labs reviewed: CBG 91-176 past 24 hrs. On 7/17 BUN 24, Potassium WNL, Magnesium WNL, Phosphorus WNL, Triglycerides 57. No BMP/CMP today.  I/O: 300 ml UOP plus 6 unmeasured occurrences of UOP past 24 hrs  Weight trend: 75.3 kg on 7/18 (+6.5 kg from admission - likely due to fluid status or use of bed  scale)  Diet Order:  Diet full liquid Room service appropriate? Yes; Fluid consistency: Thin  Skin:  Wound (see comment) (closed surgical incision abdomen)  Last BM:  03/05/2017 - type 7  Height:   Ht Readings from Last 1 Encounters:  03/04/17 5\' 2"  (1.575 m)    Weight:   Wt Readings from Last 1 Encounters:  03/13/17 166 lb 1.6  oz (75.3 kg)    Ideal Body Weight:  50 kg  BMI:  Body mass index is 30.38 kg/m.  Estimated Nutritional Needs:   Kcal:  1645-1880 (MSJ x 1.4-1.6)  Protein:  83-103 grams (1.2-1.5 grams/kg)  Fluid:  2 L/day (25 ml/kg)  EDUCATION NEEDS:   Education needs addressed  Willey Blade, MS, RD, LDN Pager: [phone] After Hours Pager: [phone]

## 2017-03-14 ENCOUNTER — Telehealth: Payer: Self-pay

## 2017-03-14 NOTE — Telephone Encounter (Signed)
Post-op call made to patient at this time. Spoke with Patient. Post-op interview questions below.  1. How are you feeling? Feeling well.   2. Is your pain controlled? Yes  3. What are you doing for the pain? Taking the  medication given.  4. Are you having any Nausea or Vomiting? None  5. Are you having any Fever or Chills? No  6. Are you having any Constipation or Diarrhea? None.  Bowels are moving normally without difficulty.  7. Is there any Swelling or Bruising you are concerned  about? No  8. Do you have any questions or concerns at this time?  None   Discussion: Reviewed post-op appointment information. Patient confirmed knowledge of this and did not need directions to the office.

## 2017-03-15 ENCOUNTER — Other Ambulatory Visit: Payer: Self-pay | Admitting: *Deleted

## 2017-03-15 ENCOUNTER — Other Ambulatory Visit: Payer: Self-pay | Admitting: Internal Medicine

## 2017-03-15 DIAGNOSIS — Z170 Estrogen receptor positive status [ER+]: Principal | ICD-10-CM

## 2017-03-15 DIAGNOSIS — M545 Low back pain, unspecified: Secondary | ICD-10-CM

## 2017-03-15 DIAGNOSIS — G8929 Other chronic pain: Secondary | ICD-10-CM

## 2017-03-15 DIAGNOSIS — M549 Dorsalgia, unspecified: Secondary | ICD-10-CM

## 2017-03-15 DIAGNOSIS — C50812 Malignant neoplasm of overlapping sites of left female breast: Secondary | ICD-10-CM

## 2017-03-15 HISTORY — DX: Low back pain, unspecified: M54.50

## 2017-03-15 HISTORY — DX: Dorsalgia, unspecified: M54.9

## 2017-03-15 HISTORY — DX: Other chronic pain: G89.29

## 2017-03-18 ENCOUNTER — Ambulatory Visit: Payer: No Typology Code available for payment source

## 2017-03-18 ENCOUNTER — Other Ambulatory Visit: Payer: Self-pay

## 2017-03-20 ENCOUNTER — Encounter: Payer: Self-pay | Admitting: Gastroenterology

## 2017-03-20 ENCOUNTER — Encounter: Payer: Self-pay | Admitting: Surgery

## 2017-03-20 ENCOUNTER — Ambulatory Visit (INDEPENDENT_AMBULATORY_CARE_PROVIDER_SITE_OTHER): Payer: Medicare HMO | Admitting: Surgery

## 2017-03-20 ENCOUNTER — Ambulatory Visit: Payer: No Typology Code available for payment source

## 2017-03-20 ENCOUNTER — Ambulatory Visit (INDEPENDENT_AMBULATORY_CARE_PROVIDER_SITE_OTHER): Payer: Medicare HMO | Admitting: Gastroenterology

## 2017-03-20 VITALS — BP 116/67 | HR 125 | Temp 98.2°F | Ht 62.0 in | Wt 145.8 lb

## 2017-03-20 VITALS — BP 149/74 | HR 114 | Temp 98.2°F | Ht 60.0 in | Wt 146.6 lb

## 2017-03-20 DIAGNOSIS — Z09 Encounter for follow-up examination after completed treatment for conditions other than malignant neoplasm: Secondary | ICD-10-CM

## 2017-03-20 DIAGNOSIS — R933 Abnormal findings on diagnostic imaging of other parts of digestive tract: Secondary | ICD-10-CM

## 2017-03-20 NOTE — Progress Notes (Signed)
Jonathon Bellows MD, MRCP(U.K) [address]  [place]  [place], [place] [address]  Main: [phone]  Fax: [phone]   Primary Care Physician: Donnie Coffin, MD  Primary Gastroenterologist:  Dr. Jonathon Bellows   Chief Complaint  Patient presents with  . Hospitalization Follow-up    HPI: RAJNI HOLSWORTH is a 71 y.o. female who is here today for a hospital follow up . I had seen her back on 02/25/17 for abdominal pain and vomiting . CT scan of the abdomen showed distension of the stomach to the level of ligament of Treitz. I performed an EGD and found no intrinsic lesions at the duodenum but noted the lumen to be collapsed, my plan was for out patient CT enterogram. She subsequently was readmitted with similar symptoms , Dr Allen Norris performed another EGD with similar findings and she did undergo a CT enterogram which showed duodenal obstruction with short segment of wall thickening in 4th portion of the duodenum suspicious for a neoplasm . She also did have mild biliary dilation and possible distal CBD calculus and MRCP was advised. PET scan showed hypermetabolic metastatic lesion in the L3 and pectoralis minor left . 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 LFT's were normal in 03/06/17    Since discharge she says that  She has so far not been seen by surgery or oncology after discharge, she was unaware of the resltus of the pathology results.  .  Current Outpatient Prescriptions  Medication Sig Dispense Refill  . ADVAIR DISKUS 500-50 MCG/DOSE AEPB Inhale 1 puff into the lungs 2 (two) times daily.     [name] albuterol (PROVENTIL HFA;VENTOLIN HFA) 108 (90 Base) MCG/ACT inhaler Inhale 2 puffs into the lungs every 6 (six) hours as needed for wheezing or shortness of breath.    [name] aspirin 81 MG tablet Take 81 mg by mouth daily.    [name] atorvastatin (LIPITOR) 10 MG tablet Take 10 mg by mouth daily.    [name] docusate sodium  (COLACE) 100 MG capsule Take 100 mg by mouth 2 (two) times daily.    . feeding supplement, ENSURE ENLIVE, (ENSURE ENLIVE) LIQD Take 237 mLs by mouth 3 (three) times daily between meals. 237 mL 12  . fluticasone (FLONASE) 50 MCG/ACT nasal spray Place 2 sprays into both nostrils daily.    [name] GLIPIZIDE XL 2.5 MG 24 hr tablet Take 2.5 mg by mouth daily with breakfast.     . letrozole (FEMARA) 2.5 MG tablet Take 1 tablet (2.5 mg total) by mouth daily. 90 tablet 4  . loratadine (CLARITIN) 10 MG tablet Take 10 mg by mouth daily.    . metoCLOPramide (REGLAN) 10 MG tablet Take 1 tablet (10 mg total) by mouth every 8 (eight) hours as needed for nausea. 90 tablet 1  . montelukast (SINGULAIR) 10 MG tablet Take 10 mg by mouth at bedtime.    [name] omeprazole (PRILOSEC) 20 MG capsule Take 20 mg by mouth every morning.     [name] oxyCODONE-acetaminophen (PERCOCET/ROXICET) 5-325 MG tablet Take 1 tablet by mouth every 4 (four) hours as needed for severe pain. 25 tablet 0  . OXYGEN Inhale 3 L into the lungs continuous.     . palbociclib (IBRANCE) 125 MG capsule Take 125 mg by mouth daily with lunch.     . SPIRIVA HANDIHALER 18 MCG inhalation capsule Place 18 mcg into inhaler and inhale every morning.     [name]  lisinopril (PRINIVIL,ZESTRIL) 40 MG tablet      No current facility-administered medications for this visit.     Allergies as of 03/20/2017 - Review Complete 03/20/2017  Allergen Reaction Noted  . Other  02/20/2016    ROS:  General: Negative for anorexia, weight loss, fever, chills, fatigue, weakness. ENT: Negative for hoarseness, difficulty swallowing , nasal congestion. CV: Negative for chest pain, angina, palpitations, dyspnea on exertion, peripheral edema.  Respiratory: Negative for dyspnea at rest, dyspnea on exertion, cough, sputum, wheezing.  GI: See history of present illness. GU:  Negative for dysuria, hematuria, urinary incontinence, urinary frequency, nocturnal urination.  Endo: Negative for unusual  weight change.    Physical Examination:   BP 116/67   Pulse (!) 125   Temp 98.2 F (36.8 C) (Oral)   Ht 5\' 2"  (1.575 m)   Wt 145 lb 12.8 oz (66.1 kg)   BMI 26.67 kg/m   General: Well-nourished, well-developed in no acute distress.  Eyes: No icterus. Conjunctivae pink. Mouth: Oropharyngeal mucosa moist and pink , no lesions erythema or exudate. Lungs: Clear to auscultation bilaterally. Non-labored. Heart: Regular rate and rhythm, no murmurs rubs or gallops.  Abdomen:Large central abdominal verticle scar , firm around the scar Bowel sounds are normal, nontender, nondistended, no hepatosplenomegaly or masses, no abdominal bruits or hernia , no rebound or guarding.   Extremities: No lower extremity edema. No clubbing or deformities. Neuro: Alert and oriented x 3.  Grossly intact. Skin: Warm and dry, no jaundice.   Psych: Alert and cooperative, normal mood and affect.   Imaging Studies: Ct Abdomen Pelvis Wo Contrast  Result Date: 03/04/2017 CLINICAL DATA:  71 year old diabetic hypertensive female with abdominal pain and nausea found to have gastroparesis/outlet obstruction. Chest pain associated with indigestion. No bowel movement for 3 days. Right breast cancer 2009 post mastectomy. Left breast cancer diagnosed 2016. Subsequent encounter. EXAM: CT ABDOMEN AND PELVIS WITHOUT CONTRAST TECHNIQUE: Multidetector CT imaging of the abdomen and pelvis was performed following the standard protocol without IV contrast. COMPARISON:  02/24/2017 plain film exam and CT.  12/20/2015 PET-CT. FINDINGS: Lower chest: Elevated left hemidiaphragm (cause by dilated stomach) with basilar atelectasis greater on the left. Coronary artery calcifications. Hepatobiliary: Taking into account limitation by non contrast imaging, no obvious mass. Calcified gallstones. Limited for evaluating for the possibility of inflammation given the small amount of adjacent fluid. Pancreas: Taking into account limitation by non contrast  imaging, no obvious mass or primary pancreatic inflammatory process. Spleen: Taking into account limitation by non contrast imaging, no mass or enlargement. Adrenals/Urinary Tract: Minimal nodularity left adrenal gland unchanged. Mild chronic fullness collecting systems greater on right without obstructing stone identified. Slightly lobular contour the kidneys without significant change. Evaluation for detection of a mass is limited by lack of contrast. Partially decompressed urinary bladder with mild thickened wall. Stomach/Bowel: Markedly dilated predominantly fluid-filled stomach and duodenum to the level of the ligament of Treitz. Mild infiltration of surrounding fat planes and prominent surrounding vessels. Small amount of fluid upper and lower abdomen without drainable fluid collection or free intraperitoneal air. Cause of proximal small-bowel obstruction is indeterminate. Underlying mass or adhesion is a possibility. No definitive findings of malrotation. No primary colonic abnormality.  Appendix unremarkable. Vascular/Lymphatic: Prominent calcified plaque aorta and aortic branch vessels without aneurysm noted. Top-normal size upper abdominal lymph nodes stable. Reproductive: Post hysterectomy.  No worrisome adnexal abnormality. Other: No bowel containing hernia. Musculoskeletal: Left L3 lytic lesion involving the vertebral body and pedicle with pathologic fracture left  L3 superior endplate. Findings suggestive of metastatic disease. IMPRESSION: 1. Markedly dilated predominantly fluid-filled stomach and duodenum to the level of the ligament of Treitz (or just beyond). Mild infiltration of surrounding fat planes and prominent surrounding vessels. Cause of proximal small-bowel obstruction is indeterminate. With patient's history of breast cancer and pathologic fracture as discussed below, it is possible findings are related to tumor although adhesions could cause a similar appearance. Patient would benefit from  nasogastric tube decompression. 2. Left L3 lytic lesion involving the vertebral body and pedicle with pathologic fracture left L3 superior endplate. Findings suggestive of metastatic disease. 3. Top-normal size upper abdominal lymph nodes unchanged. 4. Mild chronic fullness collecting systems greater on right without obstructing stone identified. Slightly lobular contour the kidneys without significant change. Evaluation for detection of a mass is limited by lack of contrast. 5. Minimal nodular left adrenal gland. 6. Elevated left hemidiaphragm secondary to dilated stomach with left base atelectasis. 7.  Aortic atherosclerosis. 8. Gallstones. Limited for detecting inflammation given the small amount of fluid in the upper abdomen. Electronically Signed   By: Genia Del M.D.   On: 03/04/2017 07:46   Ct Abdomen Pelvis Wo Contrast  Result Date: 02/24/2017 CLINICAL DATA:  Initial evaluation for acute generalized lower/ mid abdominal pain with nausea and vomiting. Concern for obstruction. EXAM: CT CHEST, ABDOMEN AND PELVIS WITHOUT CONTRAST TECHNIQUE: Multidetector CT imaging of the chest, abdomen and pelvis was performed following the standard protocol without IV contrast. COMPARISON:  Prior CT from 11/02/2016. FINDINGS: CT CHEST FINDINGS Cardiovascular: Intrathoracic aorta of normal caliber without aneurysm. Moderate aortic atherosclerosis. Partially visualized great vessels grossly within normal limits. Heart size normal. Three vessel coronary artery calcifications. No pericardial effusion. Mediastinum/Nodes: Thyroid normal. No pathologically enlarged mediastinal, hilar, or axillary lymph nodes identified. Esophagus within normal limits. Lungs/Pleura: Tracheobronchial tree is patent. Upper lobe predominant emphysema. Linear atelectasis present within the lingula and left lower lobe. Lungs are otherwise clear. No focal infiltrates. No pulmonary edema or pleural effusion. No pneumothorax. No worrisome pulmonary  nodule or mass. Musculoskeletal: No acute osseus abnormality. No worrisome lytic or blastic osseous lesions. CT ABDOMEN PELVIS FINDINGS Hepatobiliary: Limited noncontrast evaluation of the liver is unremarkable. Punctate calcification within the gallbladder may reflect a small stone versus mural calcification. No evidence for acute cholecystitis. No biliary dilatation. Pancreas: Pancreas within normal limits. Spleen: Spleen within normal limits. Adrenals/Urinary Tract: Adrenal glands within normal limits. Kidneys equal in size without evidence for nephrolithiasis or hydronephrosis. No hydroureter. Bladder partially distended and within normal limits. Stomach/Bowel: Prominent gastric distension. Additionally, there is mild distention of the duodenum to the level of the ligament of Treitz. No obvious obstructive mass identified, although an underlying obstructive lesion could conceivably be present. Distally, small bowel is decompressed and of normal caliber. Appendix normal. Colon is diffusely decompressed. No other acute inflammatory changes seen about the bowels. Vascular/Lymphatic: Moderate aorto bi-iliac atherosclerotic disease. No aneurysm. No appreciable adenopathy on this noncontrast examination. Reproductive: Uterus is not absent. Ovaries not discretely identified. Other: No free air or fluid. Musculoskeletal: No acute osseous abnormality. No worrisome lytic or blastic osseous lesions. IMPRESSION: 1. Prominent distension of the stomach and duodenum to the level of the ligament of Treitz, with decompression of the small bowel distally. While this finding may be transient and physiologic in nature, a possible proximal obstructive process could be considered in the correct clinical setting. No obvious mass or other abnormality identified on this noncontrast examination. Correlation with symptomatology recommended. Additionally, GI consultation with possible  further evaluation with upper endoscopy could be  considered as clinically warranted. 2. No other acute abnormality within the chest, abdomen, and pelvis. 3. Subcentimeter calcification within the gallbladder, which may reflect a small stone or possibly mural calcification. 4. Emphysema. 5. Moderate atherosclerosis with diffuse 3 vessel coronary artery calcifications. 6. Electronically Signed   By: Jeannine Boga M.D.   On: 02/24/2017 20:45   Dg Chest 2 View  Result Date: 03/04/2017 CLINICAL DATA:  Post NG TUBE placement EXAM: CHEST - 2 VIEW COMPARISON:  03/03/2017 FINDINGS: Nasogastric tube has been advanced into the gastric lumen. Blunting of the left costophrenic angle suggesting small effusion. Persistent infiltrate atelectasis or scarring at the left lung base. Right lung clear. Heart size normal. Atheromatous aorta. No pneumothorax Anterior vertebral endplate spurring at multiple levels in the mid and lower thoracic spine. IMPRESSION: 1. Nasogastric tube to the stomach. 2. Small left pleural effusion with atelectasis/infiltrate or scarring at the left lung base. Electronically Signed   By: Lucrezia Europe M.D.   On: 03/04/2017 12:40   Dg Chest 2 View  Result Date: 03/03/2017 CLINICAL DATA:  Chest pain and weakness EXAM: CHEST  2 VIEW COMPARISON:  Chest radiograph and chest CT February 24, 2017 FINDINGS: There is atelectatic change in the lung bases. Lungs elsewhere are clear. Heart is upper normal in size with pulmonary vascularity within normal limits. There is aortic atherosclerosis. There is mild degenerative change in thoracic spine. IMPRESSION: Bibasilar atelectasis. No edema or consolidation. Stable cardiac silhouette. There is aortic atherosclerosis. Aortic Atherosclerosis (ICD10-I70.0). Electronically Signed   By: Lowella Grip III M.D.   On: 03/03/2017 21:05   Dg Abd 1 View  Result Date: 03/07/2017 CLINICAL DATA:  Nasogastric tube placement. EXAM: ABDOMEN - 1 VIEW COMPARISON:  PET- CT March 07, 2017 at 1400 hours FINDINGS: Nasogastric tube  tip and side-port project in proximal stomach. Bowel gas pattern is nondilated and nonobstructive. No intra-abdominal mass effect or pathologic calcifications. Patient's known L3 metastasis difficult to identify by plain radiography. IMPRESSION: Nasogastric tube tip projects in proximal stomach. Electronically Signed   By: Elon Alas M.D.   On: 03/07/2017 17:57   Dg Abd 1 View  Result Date: 03/04/2017 CLINICAL DATA:  Nasogastric tube placement. EXAM: ABDOMEN - 1 VIEW COMPARISON:  Radiographs February 24, 2017. FINDINGS: The bowel gas pattern is normal. Nasogastric tube tip is seen in proximal stomach. IMPRESSION: Nasogastric tube tip seen in proximal stomach. No evidence of bowel obstruction or ileus. Electronically Signed   By: Marijo Conception, M.D.   On: 03/04/2017 11:42   Ct Chest Wo Contrast  Result Date: 02/24/2017 CLINICAL DATA:  Initial evaluation for acute generalized lower/ mid abdominal pain with nausea and vomiting. Concern for obstruction. EXAM: CT CHEST, ABDOMEN AND PELVIS WITHOUT CONTRAST TECHNIQUE: Multidetector CT imaging of the chest, abdomen and pelvis was performed following the standard protocol without IV contrast. COMPARISON:  Prior CT from 11/02/2016. FINDINGS: CT CHEST FINDINGS Cardiovascular: Intrathoracic aorta of normal caliber without aneurysm. Moderate aortic atherosclerosis. Partially visualized great vessels grossly within normal limits. Heart size normal. Three vessel coronary artery calcifications. No pericardial effusion. Mediastinum/Nodes: Thyroid normal. No pathologically enlarged mediastinal, hilar, or axillary lymph nodes identified. Esophagus within normal limits. Lungs/Pleura: Tracheobronchial tree is patent. Upper lobe predominant emphysema. Linear atelectasis present within the lingula and left lower lobe. Lungs are otherwise clear. No focal infiltrates. No pulmonary edema or pleural effusion. No pneumothorax. No worrisome pulmonary nodule or mass. Musculoskeletal:  No acute osseus abnormality. No  worrisome lytic or blastic osseous lesions. CT ABDOMEN PELVIS FINDINGS Hepatobiliary: Limited noncontrast evaluation of the liver is unremarkable. Punctate calcification within the gallbladder may reflect a small stone versus mural calcification. No evidence for acute cholecystitis. No biliary dilatation. Pancreas: Pancreas within normal limits. Spleen: Spleen within normal limits. Adrenals/Urinary Tract: Adrenal glands within normal limits. Kidneys equal in size without evidence for nephrolithiasis or hydronephrosis. No hydroureter. Bladder partially distended and within normal limits. Stomach/Bowel: Prominent gastric distension. Additionally, there is mild distention of the duodenum to the level of the ligament of Treitz. No obvious obstructive mass identified, although an underlying obstructive lesion could conceivably be present. Distally, small bowel is decompressed and of normal caliber. Appendix normal. Colon is diffusely decompressed. No other acute inflammatory changes seen about the bowels. Vascular/Lymphatic: Moderate aorto bi-iliac atherosclerotic disease. No aneurysm. No appreciable adenopathy on this noncontrast examination. Reproductive: Uterus is not absent. Ovaries not discretely identified. Other: No free air or fluid. Musculoskeletal: No acute osseous abnormality. No worrisome lytic or blastic osseous lesions. IMPRESSION: 1. Prominent distension of the stomach and duodenum to the level of the ligament of Treitz, with decompression of the small bowel distally. While this finding may be transient and physiologic in nature, a possible proximal obstructive process could be considered in the correct clinical setting. No obvious mass or other abnormality identified on this noncontrast examination. Correlation with symptomatology recommended. Additionally, GI consultation with possible further evaluation with upper endoscopy could be considered as clinically warranted. 2.  No other acute abnormality within the chest, abdomen, and pelvis. 3. Subcentimeter calcification within the gallbladder, which may reflect a small stone or possibly mural calcification. 4. Emphysema. 5. Moderate atherosclerosis with diffuse 3 vessel coronary artery calcifications. 6. Electronically Signed   By: Jeannine Boga M.D.   On: 02/24/2017 20:45   Nm Pet Image Restag (ps) Skull Base To Thigh  Result Date: 03/07/2017 CLINICAL DATA:  Subsequent Treatment strategy for left breast cancer. Bone metastases. EXAM: NUCLEAR MEDICINE PET SKULL BASE TO THIGH TECHNIQUE: 13.0 mCi F-18 FDG was injected intravenously. Full-ring PET imaging was performed from the skull base to thigh after the radiotracer. CT data was obtained and used for attenuation correction and anatomic localization. FASTING BLOOD GLUCOSE:  Value: 130 mg/dl COMPARISON:  PET-CT 12/20/2015. Abdomen and pelvis CT from 03/05/2017. Chest CT 02/24/2017 FINDINGS: NECK No hypermetabolic lymph nodes in the neck. CHEST No hypermetabolic mediastinal or hilar nodes. No suspicious pulmonary nodules on the CT scan. There is mild diffuse FDG uptake in the left pectoralis minor muscle which is asymmetrically larger than the right. There is no focal lesion within the muscle and the uptake is diffuse, not focal. Emphysema noted in the lungs. Bibasilar collapse/consolidation evident, left greater than right. Small left pleural effusion noted. ABDOMEN/PELVIS No abnormal hypermetabolic activity within the liver, pancreas, adrenal glands, or spleen. No hypermetabolic lymph nodes in the abdomen or pelvis. Stomach and duodenum less distended than on the diagnostic CT of 02/24/2017. Small lymph nodes in the gastrohepatic ligament and para- aortic space are unchanged since 02/24/2017 and 12/20/2015. No hypermetabolism in these lymph nodes on today's study. Mild right hydronephrosis is stable. Trace free fluid is noted in the cul-de-sac. SKELETON No focal hypermetabolic  activity to suggest skeletal metastasis. IMPRESSION: 1. Interval development of hypermetabolic metastatic lesion in the left aspect of the L3 vertebral body with apparent extension into the left epidural space on CT images. Lumbar spine MRI without with contrast could be used to confirm epidural tumor spread. 2. No  other evidence for hypermetabolic metastatic disease in the neck, chest, abdomen, or pelvis. 3. Diffuse FDG uptake identified in the left pectoralis minor muscle, indeterminate as there is no focal lesion within the muscle. Given the asymmetry, some component of muscle edema or inflammation may be present accounting for the asymmetric uptake. Electronically Signed   By: Misty Stanley M.D.   On: 03/07/2017 15:27   Ct Entero Abd/pelvis W Contast  Result Date: 03/06/2017 CLINICAL DATA:  Nausea and vomiting and abdominal distention for 1 week. Duodenal obstruction. 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 EXAM: CT ABDOMEN AND PELVIS WITH CONTRAST (ENTEROGRAPHY) TECHNIQUE: Multidetector CT of the abdomen and pelvis during bolus administration of intravenous contrast. Negative oral contrast was given. CONTRAST:  150mL ISOVUE-300 IOPAMIDOL (ISOVUE-300) INJECTION 61% COMPARISON:  03/04/2017 FINDINGS: Lower chest: No acute findings. Hepatobiliary: No masses identified. A few tiny calcified gallstones are seen. No evidence of cholecystitis. Mild diffuse biliary ductal dilatation is again seen, with common bile duct measuring approximately 13 mm. A sub-cm high attenuation focus is seen in the area of the distal common bile duct and ampulla on image 33/2 which may represent a distal common bile duct stone. Pancreas:  No mass or inflammatory changes. Spleen:  Within normal limits in size and appearance. Adrenals/Urinary Tract: No masses identified. No evidence of hydronephrosis. Small amount of high attenuation material seen along the dependent gallbladder wall, which is noted on prior exam.  This may be due to blood products or urinary tract infection. Stomach/Bowel: Nasogastric tube is seen in the stomach. Stomach and duodenum are dilated. There is a short segment area of wall thickening involving the fourth portion the duodenum, which measures 3.5 cm in length on image 36/2. This is suspicious for obstructing duodenal neoplasm. Remainder of small bowel and colon are nondilated. No evidence of abnormal mucosal enhancement or mesenteric inflammatory changes. No evidence of enteric fistula, extraluminal gas or abnormal fluid collections. The terminal ileum is normal in appearance. Vascular/Lymphatic: Tiny sub-cm lymph nodes are seen in the porta hepatis, celiac axis, and upper left paraaortic region. No pathologically enlarged lymph nodes identified. No abdominal aortic aneurysm. Aortic atherosclerosis. Reproductive: Prior hysterectomy noted. Adnexal regions are unremarkable in appearance. Other:  None. Musculoskeletal:  No suspicious bone lesions identified. IMPRESSION: Duodenal obstruction with short segment wall thickening in the fourth portion of the duodenum, suspicious for duodenal neoplasm. Recommend endoscopy for further evaluation. Nonspecific sub-cm upper abdominal lymph nodes in the gastrohepatic ligament, porta hepatis, celiac axis, and upper left paraaortic region. Although these may be reactive in etiology, early lymph node metastases cannot excluded. Cholelithiasis. Mild biliary ductal dilatation, with possible small distal common bile duct calculus. Suggest correlation with liver function tests; consider further evaluation with abdomen MRI and MRCP without and with contrast if warranted clinically. Small amount high high attenuation material in the urinary bladder, which may be due to blood products or urinary tract infection. Recommend correlation with urinalysis. Electronically Signed   By: Earle Gell M.D.   On: 03/06/2017 08:31   Dg Chest Port 1 View  Result Date:  03/06/2017 CLINICAL DATA:  Central line placement, metastatic breast cancer EXAM: PORTABLE CHEST 1 VIEW COMPARISON:  Portable exam at 1653 hours compared to 03/04/2017 FINDINGS: Nasogastric tube extends into stomach. New RIGHT jugular central venous catheter with tip projecting over SVC. Normal heart size, mediastinal contours, and pulmonary vascularity. Atherosclerotic calcification aorta. Emphysematous changes with LEFT basilar atelectasis and small pleural effusion. Upper lungs clear. No pneumothorax. Bones demineralized. IMPRESSION:  No pneumothorax following RIGHT jugular line placement. Emphysematous changes with LEFT pleural effusion and basilar atelectasis. Aortic Atherosclerosis (ICD10-I70.0) and Emphysema (ICD10-J43.9). Electronically Signed   By: Lavonia Dana M.D.   On: 03/06/2017 17:06   Dg Chest Portable 1 View  Result Date: 02/24/2017 CLINICAL DATA:  Initial evaluation for central line placement. EXAM: PORTABLE CHEST 1 VIEW COMPARISON:  Prior CT from 11/02/2016. FINDINGS: Right IJ approach centra venous catheter in place with tip overlying the distal SVC. Cardiac mediastinal silhouettes are within normal limits. Aortic atherosclerosis. Lungs mildly hypoinflated. Mild subsegmental atelectasis at the bilateral lung bases. No focal infiltrates. No pulmonary edema or pleural effusion. No pneumothorax. No acute osseus abnormality. IMPRESSION: 1. Right IJ approach centra venous catheter in place with tip overlying the distal SVC. 2. Shallow lung inflation with mild bibasilar subsegmental atelectasis. 3. No other active cardiopulmonary disease. 4. Aortic atherosclerosis. Electronically Signed   By: Jeannine Boga M.D.   On: 02/24/2017 18:49   Dg Abd Portable 1 View  Result Date: 02/24/2017 CLINICAL DATA:  Nasogastric tube advancement.  Initial encounter. EXAM: PORTABLE ABDOMEN - 1 VIEW COMPARISON:  Abdominal radiograph performed earlier today at 9:43 p.m. FINDINGS: The patient's enteric tube is seen  ending overlying the fundus of the stomach, with the side port about the fundus of the stomach. The visualized bowel gas pattern is grossly unremarkable. No free intra-abdominal air is seen, though evaluation for free air on a supine radiograph is limited. The visualized lung bases are clear. Mild degenerative change is noted at the lower lumbar spine. Mild sclerosis is seen at the sacroiliac joints. IMPRESSION: Enteric tube noted ending overlying the fundus of the stomach. Electronically Signed   By: Garald Balding M.D.   On: 02/24/2017 22:44   Dg Abd Portable 1 View  Result Date: 02/24/2017 CLINICAL DATA:  Evaluate NG tube placement. EXAM: PORTABLE ABDOMEN - 1 VIEW COMPARISON:  None FINDINGS: The side port of the NG tube is 7 cm above the GE junction with the tip at the GE junction. No other abnormalities. IMPRESSION: The side-port of the NG tube is 7 cm above the GE junction with the tip at the GE junction. Recommend advancement. Electronically Signed   By: Dorise Bullion III M.D   On: 02/24/2017 21:55    Assessment and Plan:   AUBRYN SPINOLA is a 71 y.o. y/o female here to follow up from her recent hospitalization. H/o breast cancer, presented with gastric outlet obstruction. EGD showed no luminal ;lesion , she underwent Roux en Y gastrojejunostomy , doing well post op. The pathology result shows a metastatic breast lesion which was studded along the omentum and serosa. Presently no issues with food passing through.  Her CT enterogram showed possible stones in the distal CBD . I discussed this with Dr Rogue Bussing as he was planning for a MRI of her spine. She is going to get her staples taken out today and she will have a MRCP ordered with her MRI lumbar spine to evaluate for stones in the CBD. She was tachycardic but comfortable at the office as she said she was very nervous. We have contacted Dr Barb Merino office and informed to recheck her pulse and if still tachycardic will need to go to the ER ti get  evaluated and rule out dehydration     Dr Jonathon Bellows  MD,MRCP [facility]) Follow up in 8 weeks .

## 2017-03-20 NOTE — Patient Instructions (Signed)
Please see your follow up appointment listed below.  °

## 2017-03-21 ENCOUNTER — Ambulatory Visit
Admission: RE | Admit: 2017-03-21 | Discharge: 2017-03-21 | Disposition: A | Discharge status: Home / Self Care | Payer: Medicare HMO | Source: Ambulatory Visit | Attending: Radiation Oncology | Admitting: Radiation Oncology

## 2017-03-21 ENCOUNTER — Encounter: Payer: Self-pay | Admitting: Radiation Oncology

## 2017-03-21 ENCOUNTER — Inpatient Hospital Stay: Payer: Medicare HMO | Admitting: Internal Medicine

## 2017-03-21 VITALS — BP 138/68 | HR 105 | Temp 96.2°F | Resp 20 | Wt 145.6 lb

## 2017-03-21 DIAGNOSIS — M549 Dorsalgia, unspecified: Secondary | ICD-10-CM | POA: Insufficient documentation

## 2017-03-21 DIAGNOSIS — Z170 Estrogen receptor positive status [ER+]: Secondary | ICD-10-CM | POA: Insufficient documentation

## 2017-03-21 DIAGNOSIS — E119 Type 2 diabetes mellitus without complications: Secondary | ICD-10-CM | POA: Insufficient documentation

## 2017-03-21 DIAGNOSIS — C50812 Malignant neoplasm of overlapping sites of left female breast: Secondary | ICD-10-CM | POA: Diagnosis not present

## 2017-03-21 DIAGNOSIS — Z7984 Long term (current) use of oral hypoglycemic drugs: Secondary | ICD-10-CM | POA: Insufficient documentation

## 2017-03-21 DIAGNOSIS — Z510 Encounter for antineoplastic radiation therapy: Secondary | ICD-10-CM | POA: Insufficient documentation

## 2017-03-21 DIAGNOSIS — C7951 Secondary malignant neoplasm of bone: Secondary | ICD-10-CM | POA: Insufficient documentation

## 2017-03-21 DIAGNOSIS — Z7982 Long term (current) use of aspirin: Secondary | ICD-10-CM | POA: Diagnosis not present

## 2017-03-21 DIAGNOSIS — Z9012 Acquired absence of left breast and nipple: Secondary | ICD-10-CM | POA: Insufficient documentation

## 2017-03-21 DIAGNOSIS — J449 Chronic obstructive pulmonary disease, unspecified: Secondary | ICD-10-CM | POA: Diagnosis not present

## 2017-03-21 DIAGNOSIS — Z8701 Personal history of pneumonia (recurrent): Secondary | ICD-10-CM | POA: Insufficient documentation

## 2017-03-21 DIAGNOSIS — K219 Gastro-esophageal reflux disease without esophagitis: Secondary | ICD-10-CM | POA: Insufficient documentation

## 2017-03-21 DIAGNOSIS — F17210 Nicotine dependence, cigarettes, uncomplicated: Secondary | ICD-10-CM | POA: Insufficient documentation

## 2017-03-21 DIAGNOSIS — Z79899 Other long term (current) drug therapy: Secondary | ICD-10-CM | POA: Insufficient documentation

## 2017-03-21 DIAGNOSIS — Z8719 Personal history of other diseases of the digestive system: Secondary | ICD-10-CM | POA: Diagnosis not present

## 2017-03-21 NOTE — Consult Note (Signed)
NEW PATIENT EVALUATION  Name: Sophia Nelson  MRN: [id number]  Date:   03/21/2017     DOB: 1946-03-30   This 71 y.o. female patient presents to the clinic for initial evaluation of metastatic disease to L3 from stage IV breast cancer.  REFERRING PHYSICIAN: Donnie Coffin, MD  CHIEF COMPLAINT:  Chief Complaint  Patient presents with  . Breast Cancer    Pt is here for initial consult of radiation.       DIAGNOSIS: The encounter diagnosis was Bone metastasis ([place]).   PREVIOUS INVESTIGATIONS:  PET CT scan reviewed MRI scan of spine pending Clinical notes reviewed Pathology reports reviewed  HPI: [name] with history of metastatic lobular carcinoma status post mastectomy. She had recently developed a small bowel obstruction and underwent a recent gastrojejunostomy with pathology showing again metastatic lobular carcinoma. Part of her workup on July 12 she had a PET CT scan showing development of hypermetabolic metastatic lesion in the left aspect of L3 vertebral body with expansion extension into left epidural space on CT scan images. Lumbar MRI scan has been ordered. She is slowly recovering from her abdominal surgeries having some significant abdominal pain and discomfort making lying down on the table difficult. She does complain of lower back pain no problems with ambulation she does use a walker for assistance. No focal motor or sensory level.  PLANNED TREATMENT REGIMEN: Palliative radiation therapy to L3  PAST MEDICAL HISTORY:  has a past medical history of Abdominal distention; AKI (acute kidney injury) ([place]) (02/24/2017); Anemia; Arthritis; Asthma; Back pain, chronic (03/15/2017); Breast cancer ([place]) (2009); Cancer of left breast ([place]) (08/09/2015); Carcinoma of overlapping sites of left breast in female, estrogen receptor positive ([place]) (03/22/2016); COPD (chronic obstructive pulmonary disease) ([place]); Diabetes ([place]) (02/24/2017); Diabetes mellitus without  complication ([place]); Duodenal obstruction; Encounter for nasogastric (NG) tube placement; Gastric outlet obstruction (02/24/2017); GERD (gastroesophageal reflux disease); Headache; Hypertension; Neck pain (04/19/2016); Pancreatitis, acute (03/04/2017); Pneumonia (2015); Recurrent low back pain (03/15/2017); and Shortness of breath dyspnea.    PAST SURGICAL HISTORY:  Past Surgical History:  Procedure Laterality Date  . ABDOMINAL HYSTERECTOMY    . BREAST SURGERY Right 2015   mastectomy  . ESOPHAGOGASTRODUODENOSCOPY N/A 02/25/2017   Procedure: ESOPHAGOGASTRODUODENOSCOPY (EGD);  Surgeon: Jonathon Bellows, MD;  Location: [facility] ENDOSCOPY;  Service: Endoscopy;  Laterality: N/A;  . ESOPHAGOGASTRODUODENOSCOPY (EGD) WITH PROPOFOL N/A 03/06/2017   Procedure: ESOPHAGOGASTRODUODENOSCOPY (EGD) WITH PROPOFOL;  Surgeon: [name], MD;  Location: [facility] ENDOSCOPY;  Service: Endoscopy;  Laterality: N/A;  . GASTROJEJUNOSTOMY N/A 03/08/2017   Procedure: Roux-en-Y gastrojejunostomy Bypass of Gastric Outlet Obstruction, small bowel resection;  Surgeon: Vickie Epley, MD;  Location: [facility] ORS;  Service: General;  Laterality: N/A;  . SIMPLE MASTECTOMY WITH AXILLARY SENTINEL NODE BIOPSY Left 02/29/2016   Procedure: SIMPLE MASTECTOMY;  Surgeon: Christene Lye, MD;  Location: [facility] ORS;  Service: General;  Laterality: Left;  . TUBAL LIGATION      FAMILY HISTORY: family history includes Lung cancer in her father.  SOCIAL HISTORY:  reports that she has been smoking Cigarettes.  She has a 3.75 pack-year smoking history. She has never used smokeless tobacco. She reports that she does not drink alcohol or use drugs.  ALLERGIES: Other  MEDICATIONS:  Current Outpatient Prescriptions  Medication Sig Dispense Refill  . ADVAIR DISKUS 500-50 MCG/DOSE AEPB Inhale 1 puff into the lungs 2 (two) times daily.     [name] albuterol (PROVENTIL HFA;VENTOLIN HFA) 108 (90 Base) MCG/ACT inhaler Inhale 2  puffs into the lungs every 6 (six) hours as  needed for wheezing or shortness of breath.    [name] aspirin 81 MG tablet Take 81 mg by mouth daily.    [name] atorvastatin (LIPITOR) 10 MG tablet Take 10 mg by mouth daily.    [name] docusate sodium (COLACE) 100 MG capsule Take 100 mg by mouth 2 (two) times daily.    . feeding supplement, ENSURE ENLIVE, (ENSURE ENLIVE) LIQD Take 237 mLs by mouth 3 (three) times daily between meals. 237 mL 12  . fluticasone (FLONASE) 50 MCG/ACT nasal spray Place 2 sprays into both nostrils daily.    [name] GLIPIZIDE XL 2.5 MG 24 hr tablet Take 2.5 mg by mouth daily with breakfast.     . letrozole (FEMARA) 2.5 MG tablet Take 1 tablet (2.5 mg total) by mouth daily. 90 tablet 4  . loratadine (CLARITIN) 10 MG tablet Take 10 mg by mouth daily.    . metoCLOPramide (REGLAN) 10 MG tablet Take 1 tablet (10 mg total) by mouth every 8 (eight) hours as needed for nausea. 90 tablet 1  . montelukast (SINGULAIR) 10 MG tablet Take 10 mg by mouth at bedtime.    [name] omeprazole (PRILOSEC) 20 MG capsule Take 20 mg by mouth every morning.     [name] oxyCODONE-acetaminophen (PERCOCET/ROXICET) 5-325 MG tablet Take 1 tablet by mouth every 4 (four) hours as needed for severe pain. 25 tablet 0  . OXYGEN Inhale 3 L into the lungs continuous.     . palbociclib (IBRANCE) 125 MG capsule Take 125 mg by mouth daily with lunch.     . SPIRIVA HANDIHALER 18 MCG inhalation capsule Place 18 mcg into inhaler and inhale every morning.     [name] lisinopril (PRINIVIL,ZESTRIL) 40 MG tablet      No current facility-administered medications for this encounter.     ECOG PERFORMANCE STATUS:  1 - Symptomatic but completely ambulatory  REVIEW OF SYSTEMS: Except for the pain and abdominal symptoms as described above Patient denies any weight loss, fatigue, weakness, fever, chills or night sweats. Patient denies any loss of vision, blurred vision. Patient denies any ringing  of the ears or hearing loss. No irregular heartbeat. Patient denies heart murmur or history of fainting. Patient  denies any chest pain or pain radiating to her upper extremities. Patient denies any shortness of breath, difficulty breathing at night, cough or hemoptysis. Patient denies any swelling in the lower legs. Patient denies any nausea vomiting, vomiting of blood, or coffee ground material in the vomitus. Patient denies any stomach pain. Patient states has had normal bowel movements no significant constipation or diarrhea. Patient denies any dysuria, hematuria or significant nocturia. Patient denies any problems walking, swelling in the joints or loss of balance. Patient denies any skin changes, loss of hair or loss of weight. Patient denies any excessive worrying or anxiety or significant depression. Patient denies any problems with insomnia. Patient denies excessive thirst, polyuria, polydipsia. Patient denies any swollen glands, patient denies easy bruising or easy bleeding. Patient denies any recent infections, allergies or URI. Patient "s visual fields have not changed significantly in recent time.    PHYSICAL EXAM: BP 138/68   Pulse (!) 105   Temp (!) 96.2 F (35.7 C)   Resp 20   Wt 145 lb 9.8 oz (66 kg)   BMI 28.44 kg/m  Well-developed elderly female in NAD. Motor sensory and DTR levels are equal and symmetric in the lower extremities. Proprioception is intact. She is status post  mastectomy also recent abdominal surgery with incision is healing well. Well-developed well-nourished patient in NAD. HEENT reveals PERLA, EOMI, discs not visualized.  Oral cavity is clear. No oral mucosal lesions are identified. Neck is clear without evidence of cervical or supraclavicular adenopathy. Lungs are clear to A&P. Cardiac examination is essentially unremarkable with regular rate and rhythm without murmur rub or thrill. Abdomen is benign with no organomegaly or masses noted. Motor sensory and DTR levels are equal and symmetric in the upper and lower extremities. Cranial nerves II through XII are grossly intact.  Proprioception is intact. No peripheral adenopathy or edema is identified. No motor or sensory levels are noted. Crude visual fields are within normal range.   LABORATORY DATA: Pathology reports reviewed    RADIOLOGY RESULTS: CT scans and PET/CT scans reviewed MRI scan will be reviewed prior to initiating treatment   IMPRESSION: Metastatic lobular carcinoma of the breast to L3 vertebral body in 71 year old female with stage IV breast cancer  PLAN: At this time I to go ahead with palliative radiation therapy to the L3 vertebral body. Would plan on delivering 3000 cGy in 10 fractions. Risks and benefits of treatment including skin reaction possible diarrhea fatigue alteration of blood counts all were discussed in detail with the patient. I like to allow some more healing of her abdominal incision so she can [place] table more comfortably. Also like to review her MRI scan prior to initiating treatment. I first set her up and ordered CT simulation. Patient family member both comprehend my treatment plan well.  I would like to take this opportunity to thank you for allowing me to participate in the care of your patient.Armstead Peaks., MD

## 2017-03-21 NOTE — Progress Notes (Signed)
S/p Roux en Y gastroJ for GOO secondary to metastatic CA Doing well She has multiple co morbidities including COPD on O2 She went to a GI appt this am and was found to be tachycardic Record reviewed, Baseline HR 90-100. She told me that when she went to the GI appt, she was not wearing the O2 and had some pain She is feeling well, taking Po and having BM, no fevers or chills Path d/w pt in detail  PE NAD HR 100s Chest: Decrease bs bilaterally. She is wearing O2 Abd: soft, staples in place, no infection. No peritonitis. Mild appropriate incisional tenderness.  A/pDebilitated pt  S/p palliative gastro J  Overall doing well Tachycardia likely from strain on her heart ( hypoxia) Clinically no evidence of anastomotic leak or complications F/U NExt week for staples removal

## 2017-03-25 ENCOUNTER — Ambulatory Visit
Admission: RE | Admit: 2017-03-25 | Discharge: 2017-03-25 | Disposition: A | Discharge status: Home / Self Care | Payer: Medicare HMO | Source: Ambulatory Visit | Attending: Internal Medicine | Admitting: Internal Medicine

## 2017-03-25 DIAGNOSIS — M545 Low back pain, unspecified: Secondary | ICD-10-CM

## 2017-03-25 DIAGNOSIS — Z170 Estrogen receptor positive status [ER+]: Secondary | ICD-10-CM

## 2017-03-25 DIAGNOSIS — C50812 Malignant neoplasm of overlapping sites of left female breast: Secondary | ICD-10-CM

## 2017-03-28 ENCOUNTER — Encounter: Payer: Self-pay | Admitting: Surgery

## 2017-03-28 ENCOUNTER — Inpatient Hospital Stay: Payer: Medicare HMO | Attending: Internal Medicine | Admitting: Internal Medicine

## 2017-03-28 ENCOUNTER — Ambulatory Visit
Admission: RE | Admit: 2017-03-28 | Discharge: 2017-03-28 | Disposition: A | Discharge status: Home / Self Care | Payer: Medicare HMO | Source: Ambulatory Visit | Attending: Internal Medicine | Admitting: Internal Medicine

## 2017-03-28 ENCOUNTER — Ambulatory Visit: Admission: RE | Admit: 2017-03-28 | Payer: Medicare HMO | Source: Ambulatory Visit

## 2017-03-28 ENCOUNTER — Ambulatory Visit (INDEPENDENT_AMBULATORY_CARE_PROVIDER_SITE_OTHER): Payer: Medicare HMO | Admitting: Surgery

## 2017-03-28 VITALS — BP 131/74 | HR 101 | Temp 97.8°F | Ht 60.0 in | Wt 142.0 lb

## 2017-03-28 VITALS — BP 137/81 | HR 101 | Temp 97.8°F | Ht 60.0 in | Wt 147.0 lb

## 2017-03-28 DIAGNOSIS — E119 Type 2 diabetes mellitus without complications: Secondary | ICD-10-CM | POA: Diagnosis not present

## 2017-03-28 DIAGNOSIS — Z7984 Long term (current) use of oral hypoglycemic drugs: Secondary | ICD-10-CM | POA: Diagnosis not present

## 2017-03-28 DIAGNOSIS — Z79811 Long term (current) use of aromatase inhibitors: Secondary | ICD-10-CM | POA: Insufficient documentation

## 2017-03-28 DIAGNOSIS — M545 Low back pain: Secondary | ICD-10-CM | POA: Insufficient documentation

## 2017-03-28 DIAGNOSIS — Z86000 Personal history of in-situ neoplasm of breast: Secondary | ICD-10-CM | POA: Diagnosis not present

## 2017-03-28 DIAGNOSIS — C7951 Secondary malignant neoplasm of bone: Secondary | ICD-10-CM | POA: Insufficient documentation

## 2017-03-28 DIAGNOSIS — Z9011 Acquired absence of right breast and nipple: Secondary | ICD-10-CM | POA: Insufficient documentation

## 2017-03-28 DIAGNOSIS — C50812 Malignant neoplasm of overlapping sites of left female breast: Secondary | ICD-10-CM | POA: Insufficient documentation

## 2017-03-28 DIAGNOSIS — K219 Gastro-esophageal reflux disease without esophagitis: Secondary | ICD-10-CM | POA: Insufficient documentation

## 2017-03-28 DIAGNOSIS — C50911 Malignant neoplasm of unspecified site of right female breast: Secondary | ICD-10-CM | POA: Diagnosis not present

## 2017-03-28 DIAGNOSIS — Z170 Estrogen receptor positive status [ER+]: Secondary | ICD-10-CM | POA: Insufficient documentation

## 2017-03-28 DIAGNOSIS — D649 Anemia, unspecified: Secondary | ICD-10-CM | POA: Insufficient documentation

## 2017-03-28 DIAGNOSIS — J449 Chronic obstructive pulmonary disease, unspecified: Secondary | ICD-10-CM | POA: Diagnosis not present

## 2017-03-28 DIAGNOSIS — Z9012 Acquired absence of left breast and nipple: Secondary | ICD-10-CM | POA: Diagnosis not present

## 2017-03-28 DIAGNOSIS — Z79899 Other long term (current) drug therapy: Secondary | ICD-10-CM | POA: Insufficient documentation

## 2017-03-28 DIAGNOSIS — Z7982 Long term (current) use of aspirin: Secondary | ICD-10-CM | POA: Insufficient documentation

## 2017-03-28 DIAGNOSIS — Z923 Personal history of irradiation: Secondary | ICD-10-CM | POA: Insufficient documentation

## 2017-03-28 DIAGNOSIS — Z801 Family history of malignant neoplasm of trachea, bronchus and lung: Secondary | ICD-10-CM | POA: Insufficient documentation

## 2017-03-28 DIAGNOSIS — G8929 Other chronic pain: Secondary | ICD-10-CM | POA: Insufficient documentation

## 2017-03-28 DIAGNOSIS — I10 Essential (primary) hypertension: Secondary | ICD-10-CM | POA: Insufficient documentation

## 2017-03-28 DIAGNOSIS — F17210 Nicotine dependence, cigarettes, uncomplicated: Secondary | ICD-10-CM | POA: Insufficient documentation

## 2017-03-28 DIAGNOSIS — Z09 Encounter for follow-up examination after completed treatment for conditions other than malignant neoplasm: Secondary | ICD-10-CM

## 2017-03-28 DIAGNOSIS — M25512 Pain in left shoulder: Secondary | ICD-10-CM

## 2017-03-28 DIAGNOSIS — M549 Dorsalgia, unspecified: Secondary | ICD-10-CM | POA: Insufficient documentation

## 2017-03-28 MED ORDER — GADOBENATE DIMEGLUMINE 529 MG/ML IV SOLN
15.0000 mL | Freq: Once | INTRAVENOUS | Status: AC | PRN
  Administered 2017-03-28: 15 mL via INTRAVENOUS

## 2017-03-28 MED ORDER — OXYCODONE-ACETAMINOPHEN 5-325 MG PO TABS: 1.0000 | ORAL_TABLET | Freq: Three times a day (TID) | ORAL | 0 refills | Status: DC | PRN

## 2017-03-28 NOTE — Progress Notes (Signed)
Patient c/o "bloating." Patient states that she has not missed any dosing of ibrance.  She would like a RF of her percocet

## 2017-03-28 NOTE — Progress Notes (Signed)
Outpatient postop visit  03/28/2017  Sophia Nelson is an 71 y.o. female.    Procedure: Roux-en-Y gastrojejunostomy for distal duodenal or proximal jejunal obstruction.  CC: Minimal back pain  HPI: Patient had a proximal small bowel obstruction and underwent a palliative bypass for what appears to be metastatic cancer. Currently she is eating well she had 1 bout of vomiting last week but none since. She thinks this was due to her taking a second helping of potatoes. It has not recurred. She is doing quite well no fevers or chills.  Medications reviewed.    Physical Exam:  BP 131/74   Pulse (!) 101   Temp 97.8 F (36.6 C) (Oral)   Ht 5' (1.524 m)   Wt 142 lb (64.4 kg)   BMI 27.73 kg/m     PE: Home oxygen use via nasal cannula. Appears well. Nutritional status is good. Wound is clean no erythema staples removed Steri-Strips are placed.    Assessment/Plan:  Status post Roux-en-Y palliative bypass for metastatic cancer involving the distal duodenum or proximal jejunum. Patient is doing quite well her staples are removed she has seen oncology and has an MRI scheduled for this afternoon. She will follow-up with [place] in 2 weeks for reexamination.  Florene Glen, MD, FACS

## 2017-03-28 NOTE — Patient Instructions (Signed)
We will see you back here as scheduled below:   If you have any questions or concerns prior to this appointment please give [place] a call.

## 2017-03-28 NOTE — Assessment & Plan Note (Addendum)
#  Metastatic breast cancer/lobular ER/PR positive HER-2/neu negative- on ibrance + Femara; noted to have progression in the bone L3; also abdomen causing extrinsic compression of the small bowel [status post resection-C discussion below]  # Discussed with the patient that would recommend switching treatments to Faslodex every month; [patient reluctant-afraid of needles]. Discussed that next option would be chemotherapy- side effects obviously more intense than Faslodex. Patient finally agreed to Faslodex. Again discussed that the treatments are palliative and not curative. And she might end up needing chemotherapy down the line. Discussed regarding the port- hold off at this time as she is not starting chemotherapy.  # Lytic lesion at L3- positive on PET scan. Recommend thoracic and lumbar spine MRI stat. Review the note from radiation oncology.  # Anemia hemoglobin 7.4 status post transfusion approximately 2 weeks ago. Patient declines blood work at this time. Not symptomatic. We'll recheck next week  # Left shoulder pain? Bursitis; on percocet BID. New Prescription given.  # Patient follow-up with me in approximately 3 weeks/cycle #1 day 15 of Faslodex. Faslodex start in 1 week.  

## 2017-03-28 NOTE — Progress Notes (Signed)
[place] OFFICE PROGRESS NOTE  Patient Care Team: Donnie Coffin, MD as PCP - General (Family Medicine) Donnie Coffin, MD as Referring Physician (Family Medicine) Christene Lye, MD (General Surgery) Forest Gleason, MD (Oncology)  Cancer of left breast [facility])   Staging form: Breast, AJCC 7th Edition     Clinical: Stage IIIB (T4, N1, cM0(i+)) - Signed by Forest Gleason, MD on 08/09/2015     Pathologic: Stage IV (T4, N1, M1) - Signed by Forest Gleason, MD on 08/16/2015    Oncology History   # DEC 2016-  LEFT BREAST CA- LOBULAR CA ER/PR-Pos; her 2 NEG STAGE IV [[id number]- left breast/bil Ax LN/Media LN/RP LN; skeletal mets]; DEC 2016- LETROZOLE + IBRANCE; April 2017- Significant response to treatment- [Dr.Sankar]; s/p Left mastec & ALND [palliative]; cont Ibrance + Letrzole. Rickard Patience [facility] 2018/sec to Anemia]  # July 2018- PROGRESSION [PET scan-L1 lytic lesion; Small bowel extrinsic compression s/p lap resection + Lobular cancer; Dr.Davis ]  # RIGHT BREAST CA [s/p mastectomy [facility]]     Carcinoma of overlapping sites of left breast in female, estrogen receptor positive ([place])    INTERVAL HISTORY:  Sophia Nelson 71 y.o.  female pleasant patient above history of Metastatic breast cancer lobular carcinoma currently Letrozole [ Ibrance on HOLD since May 2018-Secondary to worsening anemia]- Is here for follow-up.   In the interim patient got to the hospital twice- noted to have extrinsic compression of the small bowel. Ended up having bypass surgery laparoscopic. Also further workup in the hospital noted to have L1 lytic lesion. Recommend MRI. P ""atient was unable to get the MRI done because of "stomach gas".  She feels better now. She feels that she can do the MRI now.   Appetite is good. No weight loss. No nausea no vomiting. No chest pain. No fevers.  She denies blood in stools or black stools. Denies any worsening shortness of breath with exertion and cough. She  continues to be on Percocet 2 times a day.  REVIEW OF SYSTEMS:  A complete 10 point review of system is done which is negative except mentioned above/history of present illness.   PAST MEDICAL HISTORY :  Past Medical History:  Diagnosis Date  . Abdominal distention   . AKI (acute kidney injury) ([place]) 02/24/2017  . Anemia   . Arthritis   . Asthma   . Back pain, chronic 03/15/2017  . Breast cancer ([place]) 2009   RT MASTECTOMY, DCIS  . Cancer of left breast ([place]) 08/09/2015   T4 N1 M1 tumor, INVASIVE LOBULAR CARCINOMA.  . Carcinoma of overlapping sites of left breast in female, estrogen receptor positive ([place]) 03/22/2016  . COPD (chronic obstructive pulmonary disease) ([place])   . Diabetes ([place]) 02/24/2017  . Diabetes mellitus without complication ([place])   . Duodenal obstruction   . Encounter for nasogastric (NG) tube placement   . Gastric outlet obstruction 02/24/2017  . GERD (gastroesophageal reflux disease)   . Headache    h/o migraines as a child  . Hypertension   . Neck pain 04/19/2016  . Pancreatitis, acute 03/04/2017  . Pneumonia 2015  . Recurrent low back pain 03/15/2017  . Shortness of breath dyspnea    with exertion    PAST SURGICAL HISTORY :   Past Surgical History:  Procedure Laterality Date  . ABDOMINAL HYSTERECTOMY    . BREAST SURGERY Right 2015   mastectomy  . ESOPHAGOGASTRODUODENOSCOPY N/A 02/25/2017   Procedure: ESOPHAGOGASTRODUODENOSCOPY (EGD);  Surgeon: Jonathon Bellows,  MD;  Location: [facility] ENDOSCOPY;  Service: Endoscopy;  Laterality: N/A;  . ESOPHAGOGASTRODUODENOSCOPY (EGD) WITH PROPOFOL N/A 03/06/2017   Procedure: ESOPHAGOGASTRODUODENOSCOPY (EGD) WITH PROPOFOL;  Surgeon: [name], MD;  Location: [facility] ENDOSCOPY;  Service: Endoscopy;  Laterality: N/A;  . GASTROJEJUNOSTOMY N/A 03/08/2017   Procedure: Roux-en-Y gastrojejunostomy Bypass of Gastric Outlet Obstruction, small bowel resection;  Surgeon: Vickie Epley, MD;  Location: [facility] ORS;  Service: General;  Laterality: N/A;   . SIMPLE MASTECTOMY WITH AXILLARY SENTINEL NODE BIOPSY Left 02/29/2016   Procedure: SIMPLE MASTECTOMY;  Surgeon: Christene Lye, MD;  Location: [facility] ORS;  Service: General;  Laterality: Left;  . TUBAL LIGATION      FAMILY HISTORY :   Family History  Problem Relation Age of Onset  . Lung cancer Father   . Breast cancer Neg Hx     SOCIAL HISTORY:   Social History  Substance Use Topics  . Smoking status: Current Some Day Smoker    Packs/day: 0.25    Years: 15.00    Types: Cigarettes    Last attempt to quit: 02/25/2016  . Smokeless tobacco: Never Used     Comment: currently as of 02-21-16 pt states she is only smoking 2-3 cigarettes per day  . 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  MEDICATIONS:  Current Outpatient Prescriptions  Medication Sig Dispense Refill  . ADVAIR DISKUS 500-50 MCG/DOSE AEPB Inhale 1 puff into the lungs 2 (two) times daily.     [name] albuterol (PROVENTIL HFA;VENTOLIN HFA) 108 (90 Base) MCG/ACT inhaler Inhale 2 puffs into the lungs every 6 (six) hours as needed for wheezing or shortness of breath.    [name] aspirin 81 MG tablet Take 81 mg by mouth daily.    [name] atorvastatin (LIPITOR) 10 MG tablet Take 10 mg by mouth daily.    [name] docusate sodium (COLACE) 100 MG capsule Take 100 mg by mouth 2 (two) times daily.    . feeding supplement, ENSURE ENLIVE, (ENSURE ENLIVE) LIQD Take 237 mLs by mouth 3 (three) times daily between meals. 237 mL 12  . fluticasone (FLONASE) 50 MCG/ACT nasal spray Place 2 sprays into both nostrils daily.    [name] GLIPIZIDE XL 2.5 MG 24 hr tablet Take 2.5 mg by mouth daily with breakfast.     . letrozole (FEMARA) 2.5 MG tablet Take 1 tablet (2.5 mg total) by mouth daily. 90 tablet 4  . loratadine (CLARITIN) 10 MG tablet Take 10 mg by mouth daily.    . montelukast (SINGULAIR) 10 MG tablet Take 10 mg by mouth at bedtime.     [name] omeprazole (PRILOSEC) 20 MG capsule Take 20 mg by mouth every morning.     [name] oxyCODONE-acetaminophen (PERCOCET/ROXICET) 5-325 MG tablet Take 1 tablet by mouth every 8 (eight) hours as needed for severe pain. 90 tablet 0  . OXYGEN Inhale 2 L into the lungs continuous.     . palbociclib (IBRANCE) 125 MG capsule Take 125 mg by mouth daily with lunch.     . SPIRIVA HANDIHALER 18 MCG inhalation capsule Place 18 mcg into inhaler and inhale every morning.     . traMADol (ULTRAM) 50 MG tablet Take 50 mg by mouth every 8 (eight) hours as needed.    [name] lisinopril (PRINIVIL,ZESTRIL) 40 MG tablet Take 40 mg by mouth daily.  No current facility-administered medications for this visit.     PHYSICAL EXAMINATION: ECOG PERFORMANCE STATUS: 0 - Asymptomatic  BP 137/81   Pulse (!) 101   Temp 97.8 F (36.6 C) (Tympanic)   Ht 5' (1.524 m)   Wt 147 lb (66.7 kg)   SpO2 96%   BMI 28.71 kg/m   Filed Weights   03/28/17 0848  Weight: 147 lb (66.7 kg)    GENERAL: Well-nourished well-developed; Alert, no distress and comfortable. She is Accompanied by her son. She is walking herself. EYES: no pallor or icterus OROPHARYNX: no thrush or ulceration; poor dentition.   NECK: supple, no masses felt LYMPH:  no palpable lymphadenopathy in the cervical, axillary or inguinal regions LUNGS: clear to auscultation and  No wheeze or crackles HEART/CVS: regular rate & rhythm and no murmurs; No lower extremity edema ABDOMEN:abdomen soft, non-tender and normal bowel sounds Musculoskeletal:no cyanosis of digits and no clubbing;  PSYCH: alert & oriented x 3 with fluent speech NEURO: no focal motor/sensory deficits SKIN:  no rashes or significant lesions   LABORATORY DATA:  I have reviewed the data as listed    Component Value Date/Time   NA 139 03/12/2017 1108   NA 133 (L) 04/08/2014 1738   K 4.0 03/12/2017 1108   K 4.2 04/08/2014 1738   CL 108 03/12/2017 1108   CL 99 04/08/2014 1738   CO2 26 03/12/2017  1108   CO2 28 04/08/2014 1738   GLUCOSE 215 (H) 03/12/2017 1108   GLUCOSE 116 (H) 04/08/2014 1738   BUN 24 (H) 03/12/2017 1108   BUN 5 (L) 04/08/2014 1738   CREATININE 0.70 03/12/2017 1108   CREATININE 0.42 (L) 04/08/2014 1738   CALCIUM 8.7 (L) 03/12/2017 1108   CALCIUM 9.5 04/08/2014 1738   PROT 5.8 (L) 03/12/2017 1108   ALBUMIN 2.4 (L) 03/12/2017 1108   AST 17 03/12/2017 1108   ALT 97 (H) 03/12/2017 1108   ALKPHOS 67 03/12/2017 1108   BILITOT 0.7 03/12/2017 1108   GFRNONAA >60 03/12/2017 1108   GFRNONAA >60 04/08/2014 1738   GFRAA >60 03/12/2017 1108   GFRAA >60 04/08/2014 1738    No results found for: SPEP, UPEP  Lab Results  Component Value Date   WBC 11.9 (H) 03/12/2017   NEUTROABS 8.8 (H) 03/12/2017   HGB 7.6 (L) 03/12/2017   HCT 23.4 (L) 03/12/2017   MCV 82.4 03/12/2017   PLT 274 03/12/2017      Chemistry      Component Value Date/Time   NA 139 03/12/2017 1108   NA 133 (L) 04/08/2014 1738   K 4.0 03/12/2017 1108   K 4.2 04/08/2014 1738   CL 108 03/12/2017 1108   CL 99 04/08/2014 1738   CO2 26 03/12/2017 1108   CO2 28 04/08/2014 1738   BUN 24 (H) 03/12/2017 1108   BUN 5 (L) 04/08/2014 1738   CREATININE 0.70 03/12/2017 1108   CREATININE 0.42 (L) 04/08/2014 1738      Component Value Date/Time   CALCIUM 8.7 (L) 03/12/2017 1108   CALCIUM 9.5 04/08/2014 1738   ALKPHOS 67 03/12/2017 1108   AST 17 03/12/2017 1108   ALT 97 (H) 03/12/2017 1108   BILITOT 0.7 03/12/2017 1108       RADIOGRAPHIC STUDIES: I have personally reviewed the radiological images as listed and agreed with the findings in the report. No results found.   ASSESSMENT & PLAN:  Carcinoma of overlapping sites of left breast in female, estrogen receptor positive ([place]) #  Metastatic breast cancer/lobular ER/PR positive HER-2/neu negative- on ibrance + Femara; noted to have progression in the bone L3; also abdomen causing extrinsic compression of the small bowel [status post resection-C  discussion below]  # Discussed with the patient that would recommend switching treatments to Faslodex every month; [patient reluctant-afraid of needles]. Discussed that next option would be chemotherapy- side effects obviously more intense than Faslodex. Patient finally agreed to Faslodex. Again discussed that the treatments are palliative and not curative. And she might end up needing chemotherapy down the line. Discussed regarding the port- hold off at this time as she is not starting chemotherapy.  # Lytic lesion at L3- positive on PET scan. Recommend thoracic and lumbar spine MRI stat. Review the note from radiation oncology.  # Anemia hemoglobin 7.4 status post transfusion approximately 2 weeks ago. Patient declines blood work at this time. Not symptomatic. We'll recheck next week  # Left shoulder pain? Bursitis; on percocet BID. New Prescription given.  # Patient follow-up with me in approximately 3 weeks/cycle #1 day 15 of Faslodex. Faslodex start in 1 week.    Orders Placed This Encounter  Procedures  . MR Thoracic Spine W Wo Contrast    Standing Status:   Future    Standing Expiration Date:   03/28/2018    Order Specific Question:   GRA to provide read?    Answer:   Yes    Order Specific Question:   If indicated for the ordered procedure, I authorize the administration of contrast media per Radiology protocol    Answer:   Yes    Order Specific Question:   What is the patient's sedation requirement?    Answer:   No Sedation    Order Specific Question:   Does the patient have a pacemaker or implanted devices?    Answer:   No    Order Specific Question:   Preferred imaging location?    Answer:   [facility] (table limit-300lbs)    Order Specific Question:   Radiology Contrast Protocol - do NOT remove file path    Answer:   [email or web address]  . MR Lumbar Spine W Wo Contrast    Sophia Nelson    Standing Status:   Future    Standing Expiration Date:    03/28/2018    Order Specific Question:   If indicated for the ordered procedure, I authorize the administration of contrast media per Radiology protocol    Answer:   Yes    Order Specific Question:   What is the patient's sedation requirement?    Answer:   No Sedation    Order Specific Question:   Does the patient have a pacemaker or implanted devices?    Answer:   No    Order Specific Question:   Call Results- Best Contact Number?    Answer:   [phone] LET PATIENT LEAVE    Order Specific Question:   Radiology Contrast Protocol - do NOT remove file path    Answer:   [email or web address]       [name], MD 03/28/2017 9:59 AM

## 2017-04-01 ENCOUNTER — Ambulatory Visit
Admission: RE | Admit: 2017-04-01 | Discharge: 2017-04-01 | Disposition: A | Discharge status: Home / Self Care | Payer: Medicare HMO | Source: Ambulatory Visit | Attending: Radiation Oncology | Admitting: Radiation Oncology

## 2017-04-01 DIAGNOSIS — Z510 Encounter for antineoplastic radiation therapy: Secondary | ICD-10-CM | POA: Diagnosis not present

## 2017-04-03 DIAGNOSIS — Z7984 Long term (current) use of oral hypoglycemic drugs: Secondary | ICD-10-CM | POA: Diagnosis not present

## 2017-04-03 DIAGNOSIS — C7951 Secondary malignant neoplasm of bone: Secondary | ICD-10-CM | POA: Diagnosis not present

## 2017-04-03 DIAGNOSIS — Z8701 Personal history of pneumonia (recurrent): Secondary | ICD-10-CM | POA: Diagnosis not present

## 2017-04-03 DIAGNOSIS — C50812 Malignant neoplasm of overlapping sites of left female breast: Secondary | ICD-10-CM | POA: Diagnosis not present

## 2017-04-03 DIAGNOSIS — Z79899 Other long term (current) drug therapy: Secondary | ICD-10-CM | POA: Diagnosis not present

## 2017-04-03 DIAGNOSIS — M549 Dorsalgia, unspecified: Secondary | ICD-10-CM | POA: Diagnosis not present

## 2017-04-03 DIAGNOSIS — Z170 Estrogen receptor positive status [ER+]: Secondary | ICD-10-CM | POA: Diagnosis not present

## 2017-04-03 DIAGNOSIS — Z9012 Acquired absence of left breast and nipple: Secondary | ICD-10-CM | POA: Diagnosis not present

## 2017-04-03 DIAGNOSIS — K219 Gastro-esophageal reflux disease without esophagitis: Secondary | ICD-10-CM | POA: Diagnosis not present

## 2017-04-03 DIAGNOSIS — Z510 Encounter for antineoplastic radiation therapy: Secondary | ICD-10-CM | POA: Diagnosis not present

## 2017-04-03 DIAGNOSIS — Z8719 Personal history of other diseases of the digestive system: Secondary | ICD-10-CM | POA: Diagnosis not present

## 2017-04-03 DIAGNOSIS — E119 Type 2 diabetes mellitus without complications: Secondary | ICD-10-CM | POA: Diagnosis not present

## 2017-04-03 DIAGNOSIS — Z7982 Long term (current) use of aspirin: Secondary | ICD-10-CM | POA: Diagnosis not present

## 2017-04-03 DIAGNOSIS — J449 Chronic obstructive pulmonary disease, unspecified: Secondary | ICD-10-CM | POA: Diagnosis not present

## 2017-04-03 DIAGNOSIS — F17210 Nicotine dependence, cigarettes, uncomplicated: Secondary | ICD-10-CM | POA: Diagnosis not present

## 2017-04-04 ENCOUNTER — Inpatient Hospital Stay: Payer: Medicare HMO

## 2017-04-04 ENCOUNTER — Inpatient Hospital Stay (HOSPITAL_BASED_OUTPATIENT_CLINIC_OR_DEPARTMENT_OTHER): Payer: Medicare HMO | Admitting: Internal Medicine

## 2017-04-04 DIAGNOSIS — Z170 Estrogen receptor positive status [ER+]: Secondary | ICD-10-CM | POA: Diagnosis not present

## 2017-04-04 DIAGNOSIS — G8929 Other chronic pain: Secondary | ICD-10-CM | POA: Diagnosis not present

## 2017-04-04 DIAGNOSIS — Z7982 Long term (current) use of aspirin: Secondary | ICD-10-CM

## 2017-04-04 DIAGNOSIS — C7951 Secondary malignant neoplasm of bone: Secondary | ICD-10-CM | POA: Diagnosis not present

## 2017-04-04 DIAGNOSIS — J449 Chronic obstructive pulmonary disease, unspecified: Secondary | ICD-10-CM

## 2017-04-04 DIAGNOSIS — I10 Essential (primary) hypertension: Secondary | ICD-10-CM | POA: Diagnosis not present

## 2017-04-04 DIAGNOSIS — E119 Type 2 diabetes mellitus without complications: Secondary | ICD-10-CM | POA: Diagnosis not present

## 2017-04-04 DIAGNOSIS — C50812 Malignant neoplasm of overlapping sites of left female breast: Secondary | ICD-10-CM

## 2017-04-04 DIAGNOSIS — F17210 Nicotine dependence, cigarettes, uncomplicated: Secondary | ICD-10-CM

## 2017-04-04 DIAGNOSIS — Z801 Family history of malignant neoplasm of trachea, bronchus and lung: Secondary | ICD-10-CM

## 2017-04-04 DIAGNOSIS — Z7984 Long term (current) use of oral hypoglycemic drugs: Secondary | ICD-10-CM

## 2017-04-04 DIAGNOSIS — Z9012 Acquired absence of left breast and nipple: Secondary | ICD-10-CM | POA: Diagnosis not present

## 2017-04-04 DIAGNOSIS — M545 Low back pain: Secondary | ICD-10-CM | POA: Diagnosis not present

## 2017-04-04 DIAGNOSIS — K219 Gastro-esophageal reflux disease without esophagitis: Secondary | ICD-10-CM

## 2017-04-04 DIAGNOSIS — Z79899 Other long term (current) drug therapy: Secondary | ICD-10-CM

## 2017-04-04 DIAGNOSIS — D649 Anemia, unspecified: Secondary | ICD-10-CM

## 2017-04-04 DIAGNOSIS — Z79811 Long term (current) use of aromatase inhibitors: Secondary | ICD-10-CM | POA: Diagnosis not present

## 2017-04-04 LAB — CBC WITH DIFFERENTIAL/PLATELET
BASOS PCT: 1 %
Basophils Absolute: 0.1 10*3/uL (ref 0–0.1)
EOS ABS: 0 10*3/uL (ref 0–0.7)
Eosinophils Relative: 1 %
HEMATOCRIT: 25.2 % — AB (ref 35.0–47.0)
Hemoglobin: 8.3 g/dL — ABNORMAL LOW (ref 12.0–16.0)
Lymphocytes Relative: 34 %
Lymphs Abs: 1.6 10*3/uL (ref 1.0–3.6)
MCH: 27.1 pg (ref 26.0–34.0)
MCHC: 32.8 g/dL (ref 32.0–36.0)
MCV: 82.5 fL (ref 80.0–100.0)
MONO ABS: 0.6 10*3/uL (ref 0.2–0.9)
MONOS PCT: 14 %
NEUTROS ABS: 2.4 10*3/uL (ref 1.4–6.5)
Neutrophils Relative %: 50 %
Platelets: 375 10*3/uL (ref 150–440)
RBC: 3.06 MIL/uL — ABNORMAL LOW (ref 3.80–5.20)
RDW: 20 % — AB (ref 11.5–14.5)
WBC: 4.8 10*3/uL (ref 3.6–11.0)

## 2017-04-04 LAB — COMPREHENSIVE METABOLIC PANEL
ALT: 8 U/L — ABNORMAL LOW (ref 14–54)
ANION GAP: 16 — AB (ref 5–15)
AST: 8 U/L — ABNORMAL LOW (ref 15–41)
Albumin: 3.3 g/dL — ABNORMAL LOW (ref 3.5–5.0)
Alkaline Phosphatase: 70 U/L (ref 38–126)
BILIRUBIN TOTAL: 1 mg/dL (ref 0.3–1.2)
BUN: 10 mg/dL (ref 6–20)
CO2: 24 mmol/L (ref 22–32)
Calcium: 9.4 mg/dL (ref 8.9–10.3)
Chloride: 96 mmol/L — ABNORMAL LOW (ref 101–111)
Creatinine, Ser: 0.69 mg/dL (ref 0.44–1.00)
GFR calc non Af Amer: >60 mL/min (ref >60)
GLUCOSE: 94 mg/dL (ref 65–99)
POTASSIUM: 3.5 mmol/L (ref 3.5–5.1)
Sodium: 136 mmol/L (ref 135–145)
TOTAL PROTEIN: 7.5 g/dL (ref 6.5–8.1)

## 2017-04-04 MED ORDER — FULVESTRANT 250 MG/5ML IM SOLN
500.0000 mg | Freq: Once | INTRAMUSCULAR | Status: AC
  Administered 2017-04-04: 500 mg via INTRAMUSCULAR
  Filled 2017-04-04: qty 10

## 2017-04-04 NOTE — Assessment & Plan Note (Addendum)
#  Metastatic breast cancer/lobular ER/PR positive HER-2/neu negative- on ibrance [held since may 2018] + Femara; noted to have progression in the bone L3; also abdomen causing extrinsic compression of the small bowel [status post resection-C discussion below]  # recommend starting Faslodex  Starting today. Discussed the other options would be chemotherapy- which will obviously likely have more side effects. f tolerating well I would recommend addition of abemaciclib.   # Lytic lesion at L3- positive on PET scan. Lumbar spine shows L3 lesion- status post radiation oncology evaluation.  # Anemia hemoglobin 8 status post transfusion approximately 2 weeks ago.   # Keep appointments as planned/ August 23 labs.  # I reviewed the blood work- with the patient in detail; also reviewed the imaging independently [as summarized above]; and with the patient in detail.   

## 2017-04-04 NOTE — Progress Notes (Signed)
Patient requested to be added to md schedule to discuss her MRI results.

## 2017-04-04 NOTE — Progress Notes (Signed)
[name] OFFICE PROGRESS NOTE  Patient Care Team: Donnie Coffin, MD as PCP - General (Family Medicine) Donnie Coffin, MD as Referring Physician (Family Medicine) Christene Lye, MD (General Surgery) Forest Gleason, MD (Oncology)  Cancer of left breast [facility])   Staging form: Breast, AJCC 7th Edition     Clinical: Stage IIIB (T4, N1, cM0(i+)) - Signed by Forest Gleason, MD on 08/09/2015     Pathologic: Stage IV (T4, N1, M1) - Signed by Forest Gleason, MD on 08/16/2015    Oncology History   # DEC 2016-  LEFT BREAST CA- LOBULAR CA ER/PR-Pos; her 2 NEG STAGE IV [[id number]- left breast/bil Ax LN/Media LN/RP LN; skeletal mets]; DEC 2016- LETROZOLE + IBRANCE; April 2017- Significant response to treatment- [Dr.Sankar]; s/p Left mastec & ALND [palliative]; cont Ibrance + Letrzole. [name] 2018/sec to Anemia]  # July 2018- PROGRESSION [PET scan-L1 lytic lesion; Small bowel extrinsic compression s/p lap resection + Lobular cancer; Dr.Davis ]  # AUG 10th-FASLODEX  # RIGHT BREAST CA [s/p mastectomy [facility]]     Carcinoma of overlapping sites of left breast in female, estrogen receptor positive ([place])    INTERVAL HISTORY:  Sophia Nelson 71 y.o.  female pleasant patient above history of Metastatic breast cancer lobular carcinoma With recurrence in the abdomen status post resection; laparoscopic surgery. Patient is here to review the results of the MRI of the back; which was ordered as a PET scan was concerning for lesion.    Appetite is good. No weight loss. No nausea no vomiting. No chest pain. No fevers.  She denies blood in stools or black stools. Denies any worsening shortness of breath with exertion and cough. She continues to be on Percocet 2 times a day. Patient is supposed to get Faslodex today.  REVIEW OF SYSTEMS:  A complete 10 point review of system is done which is negative except mentioned above/history of present illness.   PAST MEDICAL HISTORY :  Past  Medical History:  Diagnosis Date  . Abdominal distention   . AKI (acute kidney injury) ([place]) 02/24/2017  . Anemia   . Arthritis   . Asthma   . Back pain, chronic 03/15/2017  . Breast cancer ([place]) 2009   RT MASTECTOMY, DCIS  . Cancer of left breast ([place]) 08/09/2015   T4 N1 M1 tumor, INVASIVE LOBULAR CARCINOMA.  . Carcinoma of overlapping sites of left breast in female, estrogen receptor positive ([place]) 03/22/2016  . COPD (chronic obstructive pulmonary disease) ([place])   . Diabetes ([place]) 02/24/2017  . Diabetes mellitus without complication ([place])   . Duodenal obstruction   . Encounter for nasogastric (NG) tube placement   . Gastric outlet obstruction 02/24/2017  . GERD (gastroesophageal reflux disease)   . Headache    h/o migraines as a child  . Hypertension   . Neck pain 04/19/2016  . Pancreatitis, acute 03/04/2017  . Pneumonia 2015  . Recurrent low back pain 03/15/2017  . Shortness of breath dyspnea    with exertion    PAST SURGICAL HISTORY :   Past Surgical History:  Procedure Laterality Date  . ABDOMINAL HYSTERECTOMY    . BREAST SURGERY Right 2015   mastectomy  . ESOPHAGOGASTRODUODENOSCOPY N/A 02/25/2017   Procedure: ESOPHAGOGASTRODUODENOSCOPY (EGD);  Surgeon: Jonathon Bellows, MD;  Location[facility] ENDOSCOPY;  Service: Endoscopy;  Laterality: N/A;  . ESOPHAGOGASTRODUODENOSCOPY (EGD) WITH PROPOFOL N/A 03/06/2017   Procedure: ESOPHAGOGASTRODUODENOSCOPY (EGD) WITH PROPOFOL;  Surgeon: [name], MD;  Location: [facility] ENDOSCOPY;  Service:  Endoscopy;  Laterality: N/A;  . GASTROJEJUNOSTOMY N/A 03/08/2017   Procedure: Roux-en-Y gastrojejunostomy Bypass of Gastric Outlet Obstruction, small bowel resection;  Surgeon: Vickie Epley, MD;  Location: [facility] ORS;  Service: General;  Laterality: N/A;  . SIMPLE MASTECTOMY WITH AXILLARY SENTINEL NODE BIOPSY Left 02/29/2016   Procedure: SIMPLE MASTECTOMY;  Surgeon: Christene Lye, MD;  Location: [facility] ORS;  Service: General;  Laterality: Left;  . TUBAL  LIGATION      FAMILY HISTORY :   Family History  Problem Relation Age of Onset  . Lung cancer Father   . Breast cancer Neg Hx     SOCIAL HISTORY:   Social History  Substance Use Topics  . Smoking status: Current Some Day Smoker    Packs/day: 0.25    Years: 15.00    Types: Cigarettes    Last attempt to quit: 02/25/2016  . Smokeless tobacco: Never Used     Comment: currently as of 02-21-16 pt states she is only smoking 2-3 cigarettes per day  . 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  MEDICATIONS:  Current Outpatient Prescriptions  Medication Sig Dispense Refill  . ADVAIR DISKUS 500-50 MCG/DOSE AEPB Inhale 1 puff into the lungs 2 (two) times daily.     [name] albuterol (PROVENTIL HFA;VENTOLIN HFA) 108 (90 Base) MCG/ACT inhaler Inhale 2 puffs into the lungs every 6 (six) hours as needed for wheezing or shortness of breath.    [name] aspirin 81 MG tablet Take 81 mg by mouth daily.    [name] atorvastatin (LIPITOR) 10 MG tablet Take 10 mg by mouth daily.    [name] docusate sodium (COLACE) 100 MG capsule Take 100 mg by mouth 2 (two) times daily.    . feeding supplement, ENSURE ENLIVE, (ENSURE ENLIVE) LIQD Take 237 mLs by mouth 3 (three) times daily between meals. 237 mL 12  . fluticasone (FLONASE) 50 MCG/ACT nasal spray Place 2 sprays into both nostrils daily.    [name] GLIPIZIDE XL 2.5 MG 24 hr tablet Take 2.5 mg by mouth daily with breakfast.     . loratadine (CLARITIN) 10 MG tablet Take 10 mg by mouth daily.    . metoCLOPramide (REGLAN) 10 MG tablet Take 1 tablet by mouth daily.    . montelukast (SINGULAIR) 10 MG tablet Take 10 mg by mouth at bedtime.    [name] omeprazole (PRILOSEC) 20 MG capsule Take 20 mg by mouth every morning.     [name] oxyCODONE-acetaminophen (PERCOCET/ROXICET) 5-325 MG tablet Take 1 tablet by mouth every 8 (eight) hours as needed for severe pain. 90 tablet 0  .  OXYGEN Inhale 2 L into the lungs continuous.     [name] SPIRIVA HANDIHALER 18 MCG inhalation capsule Place 18 mcg into inhaler and inhale every morning.     . traMADol (ULTRAM) 50 MG tablet Take 50 mg by mouth every 8 (eight) hours as needed.    [name] lisinopril (PRINIVIL,ZESTRIL) 40 MG tablet Take 40 mg by mouth daily.      No current facility-administered medications for this visit.     PHYSICAL EXAMINATION: ECOG PERFORMANCE STATUS: 0 - Asymptomatic  BP 132/67 (Patient Position: Sitting)   Pulse (!) 101   Temp (!) 97.2 F (36.2 C) (Tympanic)   Resp 20   Ht 5' (1.524 m)   Wt 139 lb 3.2 oz (63.1 kg)  BMI 27.19 kg/m   Filed Weights   04/04/17 1103  Weight: 139 lb 3.2 oz (63.1 kg)    GENERAL: Well-nourished well-developed; Alert, no distress and comfortable. She is Accompanied by her son/family.. She is walking herself. EYES: no pallor or icterus OROPHARYNX: no thrush or ulceration; poor dentition.   NECK: supple, no masses felt LYMPH:  no palpable lymphadenopathy in the cervical, axillary or inguinal regions LUNGS: clear to auscultation and  No wheeze or crackles HEART/CVS: regular rate & rhythm and no murmurs; No lower extremity edema ABDOMEN:abdomen soft, non-tender and normal bowel sounds Musculoskeletal:no cyanosis of digits and no clubbing;  PSYCH: alert & oriented x 3 with fluent speech NEURO: no focal motor/sensory deficits SKIN:  no rashes or significant lesions   LABORATORY DATA:  I have reviewed the data as listed    Component Value Date/Time   NA 136 04/04/2017 1047   NA 133 (L) 04/08/2014 1738   K 3.5 04/04/2017 1047   K 4.2 04/08/2014 1738   CL 96 (L) 04/04/2017 1047   CL 99 04/08/2014 1738   CO2 24 04/04/2017 1047   CO2 28 04/08/2014 1738   GLUCOSE 94 04/04/2017 1047   GLUCOSE 116 (H) 04/08/2014 1738   BUN 10 04/04/2017 1047   BUN 5 (L) 04/08/2014 1738   CREATININE 0.69 04/04/2017 1047   CREATININE 0.42 (L) 04/08/2014 1738   CALCIUM 9.4 04/04/2017 1047    CALCIUM 9.5 04/08/2014 1738   PROT 7.5 04/04/2017 1047   ALBUMIN 3.3 (L) 04/04/2017 1047   AST 8 (L) 04/04/2017 1047   ALT 8 (L) 04/04/2017 1047   ALKPHOS 70 04/04/2017 1047   BILITOT 1.0 04/04/2017 1047   GFRNONAA >60 04/04/2017 1047   GFRNONAA >60 04/08/2014 1738   GFRAA >60 04/04/2017 1047   GFRAA >60 04/08/2014 1738    No results found for: SPEP, UPEP  Lab Results  Component Value Date   WBC 4.8 04/04/2017   NEUTROABS 2.4 04/04/2017   HGB 8.3 (L) 04/04/2017   HCT 25.2 (L) 04/04/2017   MCV 82.5 04/04/2017   PLT 375 04/04/2017      Chemistry      Component Value Date/Time   NA 136 04/04/2017 1047   NA 133 (L) 04/08/2014 1738   K 3.5 04/04/2017 1047   K 4.2 04/08/2014 1738   CL 96 (L) 04/04/2017 1047   CL 99 04/08/2014 1738   CO2 24 04/04/2017 1047   CO2 28 04/08/2014 1738   BUN 10 04/04/2017 1047   BUN 5 (L) 04/08/2014 1738   CREATININE 0.69 04/04/2017 1047   CREATININE 0.42 (L) 04/08/2014 1738      Component Value Date/Time   CALCIUM 9.4 04/04/2017 1047   CALCIUM 9.5 04/08/2014 1738   ALKPHOS 70 04/04/2017 1047   AST 8 (L) 04/04/2017 1047   ALT 8 (L) 04/04/2017 1047   BILITOT 1.0 04/04/2017 1047       RADIOGRAPHIC STUDIES: I have personally reviewed the radiological images as listed and agreed with the findings in the report. No results found.   ASSESSMENT & PLAN:  Carcinoma of overlapping sites of left breast in female, estrogen receptor positive ([place]) # Metastatic breast cancer/lobular ER/PR positive HER-2/neu negative- on ibrance [held since may 2018] + Femara; noted to have progression in the bone L3; also abdomen causing extrinsic compression of the small bowel [status post resection-C discussion below]  # recommend starting Faslodex  Starting today. Discussed the other options would be chemotherapy- which will obviously likely  have more side effects. f tolerating well I would recommend addition of abemaciclib.   # Lytic lesion at L3- positive  on PET scan. Lumbar spine shows L3 lesion- status post radiation oncology evaluation.  # Anemia hemoglobin 8 status post transfusion approximately 2 weeks ago.   # Keep appointments as planned/ August 23 labs.  # I reviewed the blood work- with the patient in detail; also reviewed the imaging independently [as summarized above]; and with the patient in detail.     No orders of the defined types were placed in this encounter.      [name], MD 04/07/2017 9:29 PM

## 2017-04-05 ENCOUNTER — Other Ambulatory Visit: Payer: Self-pay | Admitting: *Deleted

## 2017-04-05 DIAGNOSIS — C50911 Malignant neoplasm of unspecified site of right female breast: Secondary | ICD-10-CM

## 2017-04-05 DIAGNOSIS — C7951 Secondary malignant neoplasm of bone: Principal | ICD-10-CM

## 2017-04-08 ENCOUNTER — Ambulatory Visit
Admission: RE | Admit: 2017-04-08 | Discharge: 2017-04-08 | Disposition: A | Discharge status: Home / Self Care | Payer: Medicare HMO | Source: Ambulatory Visit | Attending: Radiation Oncology | Admitting: Radiation Oncology

## 2017-04-08 DIAGNOSIS — Z510 Encounter for antineoplastic radiation therapy: Secondary | ICD-10-CM | POA: Diagnosis not present

## 2017-04-09 ENCOUNTER — Ambulatory Visit
Admission: RE | Admit: 2017-04-09 | Discharge: 2017-04-09 | Disposition: A | Discharge status: Home / Self Care | Payer: Medicare HMO | Source: Ambulatory Visit | Attending: Radiation Oncology | Admitting: Radiation Oncology

## 2017-04-09 DIAGNOSIS — Z510 Encounter for antineoplastic radiation therapy: Secondary | ICD-10-CM | POA: Diagnosis not present

## 2017-04-10 ENCOUNTER — Ambulatory Visit
Admission: RE | Admit: 2017-04-10 | Discharge: 2017-04-10 | Disposition: A | Discharge status: Home / Self Care | Payer: Medicare HMO | Source: Ambulatory Visit | Attending: Radiation Oncology | Admitting: Radiation Oncology

## 2017-04-10 DIAGNOSIS — Z510 Encounter for antineoplastic radiation therapy: Secondary | ICD-10-CM | POA: Diagnosis not present

## 2017-04-11 ENCOUNTER — Ambulatory Visit
Admission: RE | Admit: 2017-04-11 | Discharge: 2017-04-11 | Disposition: A | Discharge status: Home / Self Care | Payer: Medicare HMO | Source: Ambulatory Visit | Attending: Radiation Oncology | Admitting: Radiation Oncology

## 2017-04-11 DIAGNOSIS — Z510 Encounter for antineoplastic radiation therapy: Secondary | ICD-10-CM | POA: Diagnosis not present

## 2017-04-12 ENCOUNTER — Ambulatory Visit
Admission: RE | Admit: 2017-04-12 | Discharge: 2017-04-12 | Disposition: A | Discharge status: Home / Self Care | Payer: Medicare HMO | Source: Ambulatory Visit | Attending: Radiation Oncology | Admitting: Radiation Oncology

## 2017-04-12 DIAGNOSIS — Z510 Encounter for antineoplastic radiation therapy: Secondary | ICD-10-CM | POA: Diagnosis not present

## 2017-04-15 ENCOUNTER — Inpatient Hospital Stay: Payer: Medicare HMO

## 2017-04-15 ENCOUNTER — Ambulatory Visit
Admission: RE | Admit: 2017-04-15 | Discharge: 2017-04-15 | Disposition: A | Discharge status: Home / Self Care | Payer: Medicare HMO | Source: Ambulatory Visit | Attending: Radiation Oncology | Admitting: Radiation Oncology

## 2017-04-15 DIAGNOSIS — C50812 Malignant neoplasm of overlapping sites of left female breast: Secondary | ICD-10-CM | POA: Diagnosis not present

## 2017-04-15 DIAGNOSIS — C7951 Secondary malignant neoplasm of bone: Principal | ICD-10-CM

## 2017-04-15 DIAGNOSIS — C50911 Malignant neoplasm of unspecified site of right female breast: Secondary | ICD-10-CM

## 2017-04-15 DIAGNOSIS — Z510 Encounter for antineoplastic radiation therapy: Secondary | ICD-10-CM | POA: Diagnosis not present

## 2017-04-15 LAB — CBC
HEMATOCRIT: 27.2 % — AB (ref 35.0–47.0)
Hemoglobin: 9 g/dL — ABNORMAL LOW (ref 12.0–16.0)
MCH: 27.2 pg (ref 26.0–34.0)
MCHC: 33 g/dL (ref 32.0–36.0)
MCV: 82.3 fL (ref 80.0–100.0)
Platelets: 699 10*3/uL — ABNORMAL HIGH (ref 150–440)
RBC: 3.31 MIL/uL — AB (ref 3.80–5.20)
RDW: 20.9 % — AB (ref 11.5–14.5)
WBC: 6.5 10*3/uL (ref 3.6–11.0)

## 2017-04-16 ENCOUNTER — Ambulatory Visit
Admission: RE | Admit: 2017-04-16 | Discharge: 2017-04-16 | Disposition: A | Discharge status: Home / Self Care | Payer: Medicare HMO | Source: Ambulatory Visit | Attending: Radiation Oncology | Admitting: Radiation Oncology

## 2017-04-16 DIAGNOSIS — Z510 Encounter for antineoplastic radiation therapy: Secondary | ICD-10-CM | POA: Diagnosis not present

## 2017-04-17 ENCOUNTER — Telehealth: Payer: Self-pay | Admitting: General Practice

## 2017-04-17 ENCOUNTER — Ambulatory Visit
Admission: RE | Admit: 2017-04-17 | Discharge: 2017-04-17 | Disposition: A | Discharge status: Home / Self Care | Payer: Medicare HMO | Source: Ambulatory Visit | Attending: Radiation Oncology | Admitting: Radiation Oncology

## 2017-04-17 ENCOUNTER — Encounter: Payer: Self-pay | Admitting: Surgery

## 2017-04-17 DIAGNOSIS — Z510 Encounter for antineoplastic radiation therapy: Secondary | ICD-10-CM | POA: Diagnosis not present

## 2017-04-17 NOTE — Telephone Encounter (Signed)
Left message for patient to call the office to r/s her no showed appointment on 04/17/17 with Dr. Rosana Hoes. Please reschedule patient if she calls back.

## 2017-04-18 ENCOUNTER — Inpatient Hospital Stay (HOSPITAL_BASED_OUTPATIENT_CLINIC_OR_DEPARTMENT_OTHER): Payer: Medicare HMO | Admitting: Internal Medicine

## 2017-04-18 ENCOUNTER — Inpatient Hospital Stay: Payer: Medicare HMO

## 2017-04-18 ENCOUNTER — Ambulatory Visit
Admission: RE | Admit: 2017-04-18 | Discharge: 2017-04-18 | Disposition: A | Discharge status: Home / Self Care | Payer: Medicare HMO | Source: Ambulatory Visit | Attending: Radiation Oncology | Admitting: Radiation Oncology

## 2017-04-18 VITALS — BP 126/77 | HR 85 | Temp 97.6°F | Resp 22 | Ht 60.0 in | Wt 137.4 lb

## 2017-04-18 DIAGNOSIS — Z86000 Personal history of in-situ neoplasm of breast: Secondary | ICD-10-CM

## 2017-04-18 DIAGNOSIS — C50812 Malignant neoplasm of overlapping sites of left female breast: Secondary | ICD-10-CM

## 2017-04-18 DIAGNOSIS — Z9011 Acquired absence of right breast and nipple: Secondary | ICD-10-CM

## 2017-04-18 DIAGNOSIS — D649 Anemia, unspecified: Secondary | ICD-10-CM | POA: Diagnosis not present

## 2017-04-18 DIAGNOSIS — Z923 Personal history of irradiation: Secondary | ICD-10-CM

## 2017-04-18 DIAGNOSIS — Z170 Estrogen receptor positive status [ER+]: Principal | ICD-10-CM

## 2017-04-18 DIAGNOSIS — Z9012 Acquired absence of left breast and nipple: Secondary | ICD-10-CM | POA: Diagnosis not present

## 2017-04-18 DIAGNOSIS — Z7984 Long term (current) use of oral hypoglycemic drugs: Secondary | ICD-10-CM

## 2017-04-18 DIAGNOSIS — K219 Gastro-esophageal reflux disease without esophagitis: Secondary | ICD-10-CM

## 2017-04-18 DIAGNOSIS — C7951 Secondary malignant neoplasm of bone: Secondary | ICD-10-CM | POA: Diagnosis not present

## 2017-04-18 DIAGNOSIS — Z510 Encounter for antineoplastic radiation therapy: Secondary | ICD-10-CM | POA: Diagnosis not present

## 2017-04-18 DIAGNOSIS — M545 Low back pain: Secondary | ICD-10-CM | POA: Diagnosis not present

## 2017-04-18 DIAGNOSIS — I10 Essential (primary) hypertension: Secondary | ICD-10-CM

## 2017-04-18 DIAGNOSIS — G8929 Other chronic pain: Secondary | ICD-10-CM | POA: Diagnosis not present

## 2017-04-18 DIAGNOSIS — Z7982 Long term (current) use of aspirin: Secondary | ICD-10-CM

## 2017-04-18 DIAGNOSIS — Z79811 Long term (current) use of aromatase inhibitors: Secondary | ICD-10-CM | POA: Diagnosis not present

## 2017-04-18 DIAGNOSIS — E119 Type 2 diabetes mellitus without complications: Secondary | ICD-10-CM

## 2017-04-18 DIAGNOSIS — J449 Chronic obstructive pulmonary disease, unspecified: Secondary | ICD-10-CM

## 2017-04-18 DIAGNOSIS — Z79899 Other long term (current) drug therapy: Secondary | ICD-10-CM

## 2017-04-18 DIAGNOSIS — Z801 Family history of malignant neoplasm of trachea, bronchus and lung: Secondary | ICD-10-CM

## 2017-04-18 DIAGNOSIS — F17210 Nicotine dependence, cigarettes, uncomplicated: Secondary | ICD-10-CM

## 2017-04-18 LAB — CBC WITH DIFFERENTIAL/PLATELET
Basophils Absolute: 0 10*3/uL (ref 0–0.1)
Basophils Relative: 0 %
EOS ABS: 0.2 10*3/uL (ref 0–0.7)
Eosinophils Relative: 2 %
HCT: 27.8 % — ABNORMAL LOW (ref 35.0–47.0)
HEMOGLOBIN: 9.1 g/dL — AB (ref 12.0–16.0)
Lymphocytes Relative: 14 %
Lymphs Abs: 1.1 10*3/uL (ref 1.0–3.6)
MCH: 27.2 pg (ref 26.0–34.0)
MCHC: 32.9 g/dL (ref 32.0–36.0)
MCV: 82.6 fL (ref 80.0–100.0)
Monocytes Absolute: 0.7 10*3/uL (ref 0.2–0.9)
Monocytes Relative: 10 %
NEUTROS PCT: 74 %
Neutro Abs: 5.7 10*3/uL (ref 1.4–6.5)
PLATELETS: 561 10*3/uL — AB (ref 150–440)
RBC: 3.36 MIL/uL — AB (ref 3.80–5.20)
RDW: 20.5 % — AB (ref 11.5–14.5)
WBC: 7.7 10*3/uL (ref 3.6–11.0)

## 2017-04-18 LAB — COMPREHENSIVE METABOLIC PANEL
ALK PHOS: 73 U/L (ref 38–126)
ALT: 8 U/L — AB (ref 14–54)
AST: 17 U/L (ref 15–41)
Albumin: 2.8 g/dL — ABNORMAL LOW (ref 3.5–5.0)
Anion gap: 10 (ref 5–15)
BUN: 10 mg/dL (ref 6–20)
CALCIUM: 8.5 mg/dL — AB (ref 8.9–10.3)
CO2: 30 mmol/L (ref 22–32)
CREATININE: 0.67 mg/dL (ref 0.44–1.00)
Chloride: 101 mmol/L (ref 101–111)
Glucose, Bld: 95 mg/dL (ref 65–99)
Potassium: 3.3 mmol/L — ABNORMAL LOW (ref 3.5–5.1)
Sodium: 141 mmol/L (ref 135–145)
Total Bilirubin: 0.7 mg/dL (ref 0.3–1.2)
Total Protein: 6 g/dL — ABNORMAL LOW (ref 6.5–8.1)

## 2017-04-18 MED ORDER — LIDOCAINE-PRILOCAINE 2.5-2.5 % EX CREA: 1.0000 "application " | TOPICAL_CREAM | CUTANEOUS | 0 refills | Status: DC | PRN

## 2017-04-18 NOTE — Progress Notes (Signed)
[place] OFFICE PROGRESS NOTE  Patient Care Team: Donnie Coffin, MD as PCP - General (Family Medicine) Donnie Coffin, MD as Referring Physician (Family Medicine) Christene Lye, MD (General Surgery) Forest Gleason, MD (Oncology)  Cancer of left breast [facility])   Staging form: Breast, AJCC 7th Edition     Clinical: Stage IIIB (T4, N1, cM0(i+)) - Signed by Forest Gleason, MD on 08/09/2015     Pathologic: Stage IV (T4, N1, M1) - Signed by Forest Gleason, MD on 08/16/2015    Oncology History   # DEC 2016-  LEFT BREAST CA- LOBULAR CA ER/PR-Pos; her 2 NEG STAGE IV [[id number]- left breast/bil Ax LN/Media LN/RP LN; skeletal mets]; DEC 2016- LETROZOLE + IBRANCE; April 2017- Significant response to treatment- [Dr.Sankar]; s/p Left mastec & ALND [palliative]; cont Ibrance + Letrzole. Rickard Patience [facility] 2018/sec to Anemia]  # July 2018- PROGRESSION [PET scan-L1 lytic lesion; Small bowel extrinsic compression s/p lap resection + Lobular cancer; Dr.Davis ]  # AUG 9th th-FASLODEX  # AUG 2018- s/p RT to L3 lytic lesion  # RIGHT BREAST CA [s/p mastectomy [facility]]     Carcinoma of overlapping sites of left breast in female, estrogen receptor positive ([place])    INTERVAL HISTORY:  Sophia Nelson 71 y.o.  female pleasant patient above history of Metastatic breast cancer lobular carcinoma With recurrence in the abdomen status post resection; laparoscopic surgery. Patient is currently started on Faslodex 2 weeks ago. Patient is currently status post radiation to the L3 vertebral lytic lesion  Denies any abdominal pain. Denies any nausea vomiting or diarrhea. No constipation. Denies any worsening shortness of breath with exertion and cough. She continues to be on Percocet 2 times a day. Patient is supposed to get Faslodex today; however she is is reluctant to get the short because of fear of needles  REVIEW OF SYSTEMS:  A complete 10 point review of system is done which is negative except  mentioned above/history of present illness.   PAST MEDICAL HISTORY :  Past Medical History:  Diagnosis Date  . Abdominal distention   . AKI (acute kidney injury) ([place]) 02/24/2017  . Anemia   . Arthritis   . Asthma   . Back pain, chronic 03/15/2017  . Breast cancer ([place]) 2009   RT MASTECTOMY, DCIS  . Cancer of left breast ([place]) 08/09/2015   T4 N1 M1 tumor, INVASIVE LOBULAR CARCINOMA.  . Carcinoma of overlapping sites of left breast in female, estrogen receptor positive ([place]) 03/22/2016  . COPD (chronic obstructive pulmonary disease) ([place])   . Diabetes ([place]) 02/24/2017  . Diabetes mellitus without complication ([place])   . Duodenal obstruction   . Encounter for nasogastric (NG) tube placement   . Gastric outlet obstruction 02/24/2017  . GERD (gastroesophageal reflux disease)   . Headache    h/o migraines as a child  . Hypertension   . Neck pain 04/19/2016  . Pancreatitis, acute 03/04/2017  . Pneumonia 2015  . Recurrent low back pain 03/15/2017  . Shortness of breath dyspnea    with exertion    PAST SURGICAL HISTORY :   Past Surgical History:  Procedure Laterality Date  . ABDOMINAL HYSTERECTOMY    . BREAST SURGERY Right 2015   mastectomy  . ESOPHAGOGASTRODUODENOSCOPY N/A 02/25/2017   Procedure: ESOPHAGOGASTRODUODENOSCOPY (EGD);  Surgeon: Jonathon Bellows, MD;  Location: [facility] ENDOSCOPY;  Service: Endoscopy;  Laterality: N/A;  . ESOPHAGOGASTRODUODENOSCOPY (EGD) WITH PROPOFOL N/A 03/06/2017   Procedure: ESOPHAGOGASTRODUODENOSCOPY (EGD) WITH PROPOFOL;  Surgeon: Allen Norris,  Darren, MD;  Location: [place] ENDOSCOPY;  Service: Endoscopy;  Laterality: N/A;  . GASTROJEJUNOSTOMY N/A 03/08/2017   Procedure: Roux-en-Y gastrojejunostomy Bypass of Gastric Outlet Obstruction, small bowel resection;  Surgeon: Vickie Epley, MD;  Location: [facility] ORS;  Service: General;  Laterality: N/A;  . SIMPLE MASTECTOMY WITH AXILLARY SENTINEL NODE BIOPSY Left 02/29/2016   Procedure: SIMPLE MASTECTOMY;  Surgeon: Christene Lye,  MD;  Location: [facility] ORS;  Service: General;  Laterality: Left;  . TUBAL LIGATION      FAMILY HISTORY :   Family History  Problem Relation Age of Onset  . Lung cancer Father   . Breast cancer Neg Hx     SOCIAL HISTORY:   Social History  Substance Use Topics  . Smoking status: Current Some Day Smoker    Packs/day: 0.25    Years: 15.00    Types: Cigarettes    Last attempt to quit: 02/25/2016  . Smokeless tobacco: Never Used     Comment: currently as of 02-21-16 pt states she is only smoking 2-3 cigarettes per day  . 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  MEDICATIONS:  Current Outpatient Prescriptions  Medication Sig Dispense Refill  . ADVAIR DISKUS 500-50 MCG/DOSE AEPB Inhale 1 puff into the lungs 2 (two) times daily.     [name] albuterol (PROVENTIL HFA;VENTOLIN HFA) 108 (90 Base) MCG/ACT inhaler Inhale 2 puffs into the lungs every 6 (six) hours as needed for wheezing or shortness of breath.    [name] aspirin 81 MG tablet Take 81 mg by mouth daily.    [name] atorvastatin (LIPITOR) 10 MG tablet Take 10 mg by mouth daily.    [name] docusate sodium (COLACE) 100 MG capsule Take 100 mg by mouth 2 (two) times daily.    . feeding supplement, ENSURE ENLIVE, (ENSURE ENLIVE) LIQD Take 237 mLs by mouth 3 (three) times daily between meals. 237 mL 12  . fluticasone (FLONASE) 50 MCG/ACT nasal spray Place 2 sprays into both nostrils daily.    [name] GLIPIZIDE XL 2.5 MG 24 hr tablet Take 2.5 mg by mouth daily with breakfast.     . loratadine (CLARITIN) 10 MG tablet Take 10 mg by mouth daily.    . montelukast (SINGULAIR) 10 MG tablet Take 10 mg by mouth at bedtime.    [name] oxyCODONE-acetaminophen (PERCOCET/ROXICET) 5-325 MG tablet Take 1 tablet by mouth every 8 (eight) hours as needed for severe pain. 90 tablet 0  . OXYGEN Inhale 2 L into the lungs continuous.     [name] SPIRIVA HANDIHALER 18 MCG  inhalation capsule Place 18 mcg into inhaler and inhale every morning.     [name] tiZANidine (ZANAFLEX) 2 MG tablet Take 1 tablet by mouth 2 (two) times daily.    [name] lidocaine-prilocaine (EMLA) cream Apply 1 application topically as needed. Apply generously over the area 30 mins prior to injection 30 g 0  . lisinopril (PRINIVIL,ZESTRIL) 40 MG tablet Take 40 mg by mouth daily.     [name] omeprazole (PRILOSEC) 20 MG capsule Take 20 mg by mouth every morning.      No current facility-administered medications for this visit.     PHYSICAL EXAMINATION: ECOG PERFORMANCE STATUS: 0 - Asymptomatic  BP 126/77 (Patient Position: Sitting)   Pulse 85   Temp 97.6 F (36.4 C) (Tympanic)   Resp (!) 22  Ht 5' (1.524 m)   Wt 137 lb 6.4 oz (62.3 kg)   SpO2 99% Comment: 2Liters  BMI 26.83 kg/m   Filed Weights   04/18/17 0955  Weight: 137 lb 6.4 oz (62.3 kg)    GENERAL: Well-nourished well-developed; Alert, no distress and comfortable. She is alone. She is walking herself. EYES: no pallor or icterus OROPHARYNX: no thrush or ulceration; poor dentition.   NECK: supple, no masses felt LYMPH:  no palpable lymphadenopathy in the cervical, axillary or inguinal regions LUNGS: clear to auscultation and  No wheeze or crackles HEART/CVS: regular rate & rhythm and no murmurs; No lower extremity edema ABDOMEN:abdomen soft, non-tender and normal bowel sounds Musculoskeletal:no cyanosis of digits and no clubbing;  PSYCH: alert & oriented x 3 with fluent speech NEURO: no focal motor/sensory deficits SKIN:  no rashes or significant lesions   LABORATORY DATA:  I have reviewed the data as listed    Component Value Date/Time   NA 141 04/18/2017 0939   NA 133 (L) 04/08/2014 1738   K 3.3 (L) 04/18/2017 0939   K 4.2 04/08/2014 1738   CL 101 04/18/2017 0939   CL 99 04/08/2014 1738   CO2 30 04/18/2017 0939   CO2 28 04/08/2014 1738   GLUCOSE 95 04/18/2017 0939   GLUCOSE 116 (H) 04/08/2014 1738   BUN 10 04/18/2017  0939   BUN 5 (L) 04/08/2014 1738   CREATININE 0.67 04/18/2017 0939   CREATININE 0.42 (L) 04/08/2014 1738   CALCIUM 8.5 (L) 04/18/2017 0939   CALCIUM 9.5 04/08/2014 1738   PROT 6.0 (L) 04/18/2017 0939   ALBUMIN 2.8 (L) 04/18/2017 0939   AST 17 04/18/2017 0939   ALT 8 (L) 04/18/2017 0939   ALKPHOS 73 04/18/2017 0939   BILITOT 0.7 04/18/2017 0939   GFRNONAA >60 04/18/2017 0939   GFRNONAA >60 04/08/2014 1738   GFRAA >60 04/18/2017 0939   GFRAA >60 04/08/2014 1738    No results found for: SPEP, UPEP  Lab Results  Component Value Date   WBC 7.7 04/18/2017   NEUTROABS 5.7 04/18/2017   HGB 9.1 (L) 04/18/2017   HCT 27.8 (L) 04/18/2017   MCV 82.6 04/18/2017   PLT 561 (H) 04/18/2017      Chemistry      Component Value Date/Time   NA 141 04/18/2017 0939   NA 133 (L) 04/08/2014 1738   K 3.3 (L) 04/18/2017 0939   K 4.2 04/08/2014 1738   CL 101 04/18/2017 0939   CL 99 04/08/2014 1738   CO2 30 04/18/2017 0939   CO2 28 04/08/2014 1738   BUN 10 04/18/2017 0939   BUN 5 (L) 04/08/2014 1738   CREATININE 0.67 04/18/2017 0939   CREATININE 0.42 (L) 04/08/2014 1738      Component Value Date/Time   CALCIUM 8.5 (L) 04/18/2017 0939   CALCIUM 9.5 04/08/2014 1738   ALKPHOS 73 04/18/2017 0939   AST 17 04/18/2017 0939   ALT 8 (L) 04/18/2017 0939   BILITOT 0.7 04/18/2017 0939       RADIOGRAPHIC STUDIES: I have personally reviewed the radiological images as listed and agreed with the findings in the report. No results found.   ASSESSMENT & PLAN:  Carcinoma of overlapping sites of left breast in female, estrogen receptor positive ([place]) # Metastatic breast cancer/lobular ER/PR positive HER-2/neu negative-July-AUG 2018- progression in the bone L3; also abdomen causing extrinsic compression of the small bowel [status post resection-C discussion below]. Patient currently on Faslodex.   # Patient reluctant to  get faslodex today [fear of needles]; Will plan to get in 2 weeks; I would  recommend addition of abemaciclib [given intolerance to ibrance]  # Lytic lesion at L3- positive on PET scan. Lumbar spine shows L3 lesion- on RT [last treatment on 8/27]  # Bone metastases- Recommend X-geva; discussed the potential side effects of hypocalcemia and osteonecrosis of the jaw. We'll start next visit.  # Anemia-multifactorial hemoglobin - improving Hb 9. Monitor for now.     Orders Placed This Encounter  Procedures  . CBC with Differential    Standing Status:   Future    Standing Expiration Date:   04/18/2018  . Comprehensive metabolic panel    Standing Status:   Future    Standing Expiration Date:   04/18/2018       [name], MD 04/30/2017 8:13 AM

## 2017-04-18 NOTE — Assessment & Plan Note (Addendum)
#  Metastatic breast cancer/lobular ER/PR positive HER-2/neu negative-July-AUG 2018- progression in the bone L3; also abdomen causing extrinsic compression of the small bowel [status post resection-C discussion below]. Patient currently on Faslodex.   # Patient reluctant to get faslodex today [fear of needles]; Will plan to get in 2 weeks; I would recommend addition of abemaciclib [given intolerance to ibrance]  # Lytic lesion at L3- positive on PET scan. Lumbar spine shows L3 lesion- on RT [last treatment on 8/27]  # Bone metastases- Recommend X-geva; discussed the potential side effects of hypocalcemia and osteonecrosis of the jaw. We'll start next visit.  # Anemia-multifactorial hemoglobin - improving Hb 9. Monitor for now.

## 2017-04-19 ENCOUNTER — Ambulatory Visit
Admission: RE | Admit: 2017-04-19 | Discharge: 2017-04-19 | Disposition: A | Discharge status: Home / Self Care | Payer: Medicare HMO | Source: Ambulatory Visit | Attending: Radiation Oncology | Admitting: Radiation Oncology

## 2017-04-19 DIAGNOSIS — Z510 Encounter for antineoplastic radiation therapy: Secondary | ICD-10-CM | POA: Diagnosis not present

## 2017-04-22 ENCOUNTER — Ambulatory Visit
Admission: RE | Admit: 2017-04-22 | Discharge: 2017-04-22 | Disposition: A | Discharge status: Home / Self Care | Payer: Medicare HMO | Source: Ambulatory Visit | Attending: Radiation Oncology | Admitting: Radiation Oncology

## 2017-04-22 DIAGNOSIS — Z510 Encounter for antineoplastic radiation therapy: Secondary | ICD-10-CM | POA: Diagnosis not present

## 2017-04-30 DIAGNOSIS — C7951 Secondary malignant neoplasm of bone: Secondary | ICD-10-CM | POA: Insufficient documentation

## 2017-05-02 ENCOUNTER — Telehealth: Payer: Self-pay | Admitting: Pharmacist

## 2017-05-02 ENCOUNTER — Inpatient Hospital Stay: Payer: Medicare HMO

## 2017-05-02 ENCOUNTER — Inpatient Hospital Stay (HOSPITAL_BASED_OUTPATIENT_CLINIC_OR_DEPARTMENT_OTHER): Payer: Medicare HMO | Admitting: Internal Medicine

## 2017-05-02 ENCOUNTER — Inpatient Hospital Stay: Payer: Medicare HMO | Attending: Internal Medicine

## 2017-05-02 VITALS — BP 110/66 | HR 88 | Temp 97.1°F | Resp 88 | Wt 134.2 lb

## 2017-05-02 DIAGNOSIS — Z923 Personal history of irradiation: Secondary | ICD-10-CM | POA: Diagnosis not present

## 2017-05-02 DIAGNOSIS — E119 Type 2 diabetes mellitus without complications: Secondary | ICD-10-CM

## 2017-05-02 DIAGNOSIS — K219 Gastro-esophageal reflux disease without esophagitis: Secondary | ICD-10-CM | POA: Diagnosis not present

## 2017-05-02 DIAGNOSIS — C50812 Malignant neoplasm of overlapping sites of left female breast: Secondary | ICD-10-CM | POA: Diagnosis present

## 2017-05-02 DIAGNOSIS — F17210 Nicotine dependence, cigarettes, uncomplicated: Secondary | ICD-10-CM

## 2017-05-02 DIAGNOSIS — G8929 Other chronic pain: Secondary | ICD-10-CM | POA: Diagnosis not present

## 2017-05-02 DIAGNOSIS — M549 Dorsalgia, unspecified: Secondary | ICD-10-CM | POA: Diagnosis not present

## 2017-05-02 DIAGNOSIS — Z79899 Other long term (current) drug therapy: Secondary | ICD-10-CM | POA: Diagnosis not present

## 2017-05-02 DIAGNOSIS — Z170 Estrogen receptor positive status [ER+]: Secondary | ICD-10-CM

## 2017-05-02 DIAGNOSIS — Z7982 Long term (current) use of aspirin: Secondary | ICD-10-CM | POA: Insufficient documentation

## 2017-05-02 DIAGNOSIS — D649 Anemia, unspecified: Secondary | ICD-10-CM | POA: Diagnosis not present

## 2017-05-02 DIAGNOSIS — Z86000 Personal history of in-situ neoplasm of breast: Secondary | ICD-10-CM | POA: Diagnosis not present

## 2017-05-02 DIAGNOSIS — J449 Chronic obstructive pulmonary disease, unspecified: Secondary | ICD-10-CM | POA: Diagnosis not present

## 2017-05-02 DIAGNOSIS — Z9981 Dependence on supplemental oxygen: Secondary | ICD-10-CM | POA: Diagnosis not present

## 2017-05-02 DIAGNOSIS — Z79811 Long term (current) use of aromatase inhibitors: Secondary | ICD-10-CM

## 2017-05-02 DIAGNOSIS — Z9011 Acquired absence of right breast and nipple: Secondary | ICD-10-CM

## 2017-05-02 DIAGNOSIS — Z9012 Acquired absence of left breast and nipple: Secondary | ICD-10-CM | POA: Diagnosis not present

## 2017-05-02 DIAGNOSIS — Z7984 Long term (current) use of oral hypoglycemic drugs: Secondary | ICD-10-CM | POA: Diagnosis not present

## 2017-05-02 DIAGNOSIS — C7951 Secondary malignant neoplasm of bone: Secondary | ICD-10-CM | POA: Insufficient documentation

## 2017-05-02 LAB — CBC WITH DIFFERENTIAL/PLATELET
BASOS ABS: 0.1 10*3/uL (ref 0–0.1)
Basophils Relative: 1 %
Eosinophils Absolute: 0.4 10*3/uL (ref 0–0.7)
Eosinophils Relative: 6 %
HCT: 28.4 % — ABNORMAL LOW (ref 35.0–47.0)
HEMOGLOBIN: 9.2 g/dL — AB (ref 12.0–16.0)
LYMPHS ABS: 1.3 10*3/uL (ref 1.0–3.6)
LYMPHS PCT: 22 %
MCH: 26.8 pg (ref 26.0–34.0)
MCHC: 32.5 g/dL (ref 32.0–36.0)
MCV: 82.5 fL (ref 80.0–100.0)
Monocytes Absolute: 0.7 10*3/uL (ref 0.2–0.9)
Monocytes Relative: 11 %
NEUTROS PCT: 60 %
Neutro Abs: 3.6 10*3/uL (ref 1.4–6.5)
Platelets: 299 10*3/uL (ref 150–440)
RBC: 3.44 MIL/uL — AB (ref 3.80–5.20)
RDW: 20.2 % — ABNORMAL HIGH (ref 11.5–14.5)
WBC: 6.1 10*3/uL (ref 3.6–11.0)

## 2017-05-02 LAB — COMPREHENSIVE METABOLIC PANEL
ALK PHOS: 80 U/L (ref 38–126)
ALT: 12 U/L — AB (ref 14–54)
AST: 29 U/L (ref 15–41)
Albumin: 2.5 g/dL — ABNORMAL LOW (ref 3.5–5.0)
Anion gap: 7 (ref 5–15)
BUN: 9 mg/dL (ref 6–20)
CALCIUM: 8.5 mg/dL — AB (ref 8.9–10.3)
CO2: 32 mmol/L (ref 22–32)
CREATININE: 0.64 mg/dL (ref 0.44–1.00)
Chloride: 102 mmol/L (ref 101–111)
Glucose, Bld: 105 mg/dL — ABNORMAL HIGH (ref 65–99)
Potassium: 3.3 mmol/L — ABNORMAL LOW (ref 3.5–5.1)
Sodium: 141 mmol/L (ref 135–145)
Total Bilirubin: 0.5 mg/dL (ref 0.3–1.2)
Total Protein: 5.8 g/dL — ABNORMAL LOW (ref 6.5–8.1)

## 2017-05-02 MED ORDER — ABEMACICLIB 150 MG PO TABS: 150.0000 mg | ORAL_TABLET | Freq: Two times a day (BID) | ORAL | 6 refills | Status: DC

## 2017-05-02 MED ORDER — FULVESTRANT 250 MG/5ML IM SOLN
500.0000 mg | Freq: Once | INTRAMUSCULAR | Status: AC
  Administered 2017-05-02: 500 mg via INTRAMUSCULAR
  Filled 2017-05-02: qty 10

## 2017-05-02 MED ORDER — OXYCODONE-ACETAMINOPHEN 5-325 MG PO TABS: 1.0000 | ORAL_TABLET | Freq: Three times a day (TID) | ORAL | 0 refills | Status: DC | PRN

## 2017-05-02 MED ORDER — LOPERAMIDE HCL 2 MG PO CAPS: ORAL_CAPSULE | ORAL | 0 refills | Status: DC

## 2017-05-02 MED ORDER — DENOSUMAB 120 MG/1.7ML ~~LOC~~ SOLN
120.0000 mg | Freq: Once | SUBCUTANEOUS | Status: AC
  Administered 2017-05-02: 120 mg via SUBCUTANEOUS
  Filled 2017-05-02: qty 1.7

## 2017-05-02 NOTE — Progress Notes (Signed)
Patient is here today for a follow up. Patient states no new concerns today.  

## 2017-05-02 NOTE — Telephone Encounter (Signed)
Oral Oncology Patient Advocate Encounter  Received notification from [facility] that prior authorization for Verzenio is required.  PA submitted on CoverMyMeds Key [id number] Status is pending   Oral Oncology Clinic will continue to follow.    [place] Patient Advocate [phone] 05/02/2017 3:16 PM

## 2017-05-02 NOTE — Telephone Encounter (Addendum)
Oral Oncology Pharmacist Encounter  Received new prescription for abemaciclib (Verzenio) for the treatment of metastatic breast cancer in conjunction with faslodex, planned duration until disease progression or unacceptable drug toxicity.  CBC/CMP from 05/02/17 assessed, no relevant lab abnormalities. Prescription dose and frequency assessed.   Current medication list in Epic reviewed, no DDIs with identified.  Prescription has been e-scribed to the [facility] for benefits analysis and approval.  Oral Oncology Clinic will continue to follow for insurance authorization, copayment issues, initial counseling and start date.  Darl Pikes, PharmD, BCPS Hematology/Oncology Clinical Pharmacist [facility] [phone]  05/02/2017 12:30 PM

## 2017-05-02 NOTE — Assessment & Plan Note (Addendum)
#  Metastatic breast cancer/lobular ER/PR positive HER-2/neu negative-July-AUG 2018- progression in the bone L3; also abdomen causing extrinsic compression of the small bowel [status post resection-C discussion below]. Patient currently on Faslodex.   # continue Faslodex today; I would recommend addition of abemaciclib [given intolerance to ibrance]  # Lytic lesion at L3- positive on PET scan. Lumbar spine shows L3 lesion- on RT [last treatment on 8/27]  # Bone metastases- Recommend X-geva; discussed the potential side effects of hypocalcemia and osteonecrosis of the jaw. We'll start today.   # Anemia-multifactorial hemoglobin - improving Hb 9. 3/improving. Monitor for now.  # follow up in 4 weeks/labs.

## 2017-05-02 NOTE — Progress Notes (Signed)
[place] OFFICE PROGRESS NOTE  Patient Care Team: Donnie Coffin, MD as PCP - General (Family Medicine) Donnie Coffin, MD as Referring Physician (Family Medicine) Christene Lye, MD (General Surgery) Forest Gleason, MD (Oncology)  Cancer of left breast [facility])   Staging form: Breast, AJCC 7th Edition     Clinical: Stage IIIB (T4, N1, cM0(i+)) - Signed by Forest Gleason, MD on 08/09/2015     Pathologic: Stage IV (T4, N1, M1) - Signed by Forest Gleason, MD on 08/16/2015    Oncology History   # DEC 2016-  LEFT BREAST CA- LOBULAR CA ER/PR-Pos; her 2 NEG STAGE IV [[id number]- left breast/bil Ax LN/Media LN/RP LN; skeletal mets]; DEC 2016- LETROZOLE + IBRANCE; April 2017- Significant response to treatment- [Dr.Sankar]; s/p Left mastec & ALND [palliative]; cont Ibrance + Letrzole. [facility]ard Pat[facility] 2018/sec to Anemia]  # July 2018- PROGRESSION [PET scan-L1 lytic lesion; Small bowel extrinsic compression s/p lap resection + Lobular cancer; Dr.Davis ]  # AUG 9th th-FASLODEX  # AUG 2018- s/p RT to L3 lytic lesion  # RIGHT BREAST CA [s/p mastectomy [facility]]     Carcinoma of overlapping sites of left breast in female, estrogen receptor positive ([place])    INTERVAL HISTORY:  Sophia Nelson 71 y.o.  female pleasant patient above history of Metastatic breast cancer lobular carcinoma With recurrence in the abdomen status post resection; laparoscopic surgery. Patient is currently started on Faslodex 4 weeks ago. Patient is currently status post radiation to the L3 vertebral lytic lesion.  Patient denies any worsening pain. She continues to be on narcotic pain medication-Percocet.. Denies any worsening shortness of breath with exertion and cough.   REVIEW OF SYSTEMS:  A complete 10 point review of system is done which is negative except mentioned above/history of present illness.   PAST MEDICAL HISTORY :  Past Medical History:  Diagnosis Date  . Abdominal distention   . AKI  (acute kidney injury) ([place]) 02/24/2017  . Anemia   . Arthritis   . Asthma   . Back pain, chronic 03/15/2017  . Breast cancer ([place]) 2009   RT MASTECTOMY, DCIS  . Cancer of left breast ([place]) 08/09/2015   T4 N1 M1 tumor, INVASIVE LOBULAR CARCINOMA.  . Carcinoma of overlapping sites of left breast in female, estrogen receptor positive ([place]) 03/22/2016  . COPD (chronic obstructive pulmonary disease) ([place])   . Diabetes ([place]) 02/24/2017  . Diabetes mellitus without complication ([place])   . Duodenal obstruction   . Encounter for nasogastric (NG) tube placement   . Gastric outlet obstruction 02/24/2017  . GERD (gastroesophageal reflux disease)   . Headache    h/o migraines as a child  . Hypertension   . Neck pain 04/19/2016  . Pancreatitis, acute 03/04/2017  . Pneumonia 2015  . Recurrent low back pain 03/15/2017  . Shortness of breath dyspnea    with exertion    PAST SURGICAL HISTORY :   Past Surgical History:  Procedure Laterality Date  . ABDOMINAL HYSTERECTOMY    . BREAST SURGERY Right 2015   mastectomy  . ESOPHAGOGASTRODUODENOSCOPY N/A 02/25/2017   Procedure: ESOPHAGOGASTRODUODENOSCOPY (EGD);  Surgeon: Jonathon Bellows, MD;  Location: [facility] ENDOSCOPY;  Service: Endoscopy;  Laterality: N/A;  . ESOPHAGOGASTRODUODENOSCOPY (EGD) WITH PROPOFOL N/A 03/06/2017   Procedure: ESOPHAGOGASTRODUODENOSCOPY (EGD) WITH PROPOFOL;  Surgeon: [name], MD;  Location: [facility] ENDOSCOPY;  Service: Endoscopy;  Laterality: N/A;  . GASTROJEJUNOSTOMY N/A 03/08/2017   Procedure: Roux-en-Y gastrojejunostomy Bypass of Gastric Outlet Obstruction, small bowel  resection;  Surgeon: Vickie Epley, MD;  Location: [facility] ORS;  Service: General;  Laterality: N/A;  . SIMPLE MASTECTOMY WITH AXILLARY SENTINEL NODE BIOPSY Left 02/29/2016   Procedure: SIMPLE MASTECTOMY;  Surgeon: Christene Lye, MD;  Location: [facility] ORS;  Service: General;  Laterality: Left;  . TUBAL LIGATION      FAMILY HISTORY :   Family History  Problem Relation  Age of Onset  . Lung cancer Father   . Breast cancer Neg Hx     SOCIAL HISTORY:   Social History  Substance Use Topics  . Smoking status: Current Some Day Smoker    Packs/day: 0.25    Years: 15.00    Types: Cigarettes    Last attempt to quit: 02/25/2016  . Smokeless tobacco: Never Used     Comment: currently as of 02-21-16 pt states she is only smoking 2-3 cigarettes per day  . 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  MEDICATIONS:  Current Outpatient Prescriptions  Medication Sig Dispense Refill  . ADVAIR DISKUS 500-50 MCG/DOSE AEPB Inhale 1 puff into the lungs 2 (two) times daily.     [name] albuterol (PROVENTIL HFA;VENTOLIN HFA) 108 (90 Base) MCG/ACT inhaler Inhale 2 puffs into the lungs every 6 (six) hours as needed for wheezing or shortness of breath.    [name] aspirin 81 MG tablet Take 81 mg by mouth daily.    [name] atorvastatin (LIPITOR) 10 MG tablet Take 10 mg by mouth daily.    [name] docusate sodium (COLACE) 100 MG capsule Take 100 mg by mouth 2 (two) times daily.    . feeding supplement, ENSURE ENLIVE, (ENSURE ENLIVE) LIQD Take 237 mLs by mouth 3 (three) times daily between meals. 237 mL 12  . fluticasone (FLONASE) 50 MCG/ACT nasal spray Place 2 sprays into both nostrils daily.    [name] GLIPIZIDE XL 2.5 MG 24 hr tablet Take 2.5 mg by mouth daily with breakfast.     . loratadine (CLARITIN) 10 MG tablet Take 10 mg by mouth daily.    . montelukast (SINGULAIR) 10 MG tablet Take 10 mg by mouth at bedtime.    [name] omeprazole (PRILOSEC) 20 MG capsule Take 20 mg by mouth every morning.     [name] oxyCODONE-acetaminophen (PERCOCET/ROXICET) 5-325 MG tablet Take 1 tablet by mouth every 8 (eight) hours as needed for severe pain. 90 tablet 0  . OXYGEN Inhale 2 L into the lungs continuous.     [name] SPIRIVA HANDIHALER 18 MCG inhalation capsule Place 18 mcg into inhaler and inhale every  morning.     [name] tiZANidine (ZANAFLEX) 2 MG tablet Take 1 tablet by mouth 2 (two) times daily.    . Abemaciclib 150 MG TABS Take 150 mg by mouth 2 (two) times daily. 60 tablet 6  . lisinopril (PRINIVIL,ZESTRIL) 40 MG tablet Take 40 mg by mouth daily.     [name] loperamide (IMODIUM) 2 MG capsule One pill after each loose stool; maximum upto 8 pills a day. 40 capsule 0   No current facility-administered medications for this visit.     PHYSICAL EXAMINATION: ECOG PERFORMANCE STATUS: 0 - Asymptomatic  BP 110/66 (BP Location: Left Arm, Patient Position: Sitting)   Pulse 88   Temp (!) 97.1 F (36.2 C) (Tympanic)   Resp (!) 88   Wt 134 lb 3.2 oz (60.9 kg)  BMI 26.21 kg/m   Filed Weights   05/02/17 0952  Weight: 134 lb 3.2 oz (60.9 kg)    GENERAL: Well-nourished well-developed; Alert, no distress and comfortable. She is alone. She is walking herself. EYES: no pallor or icterus OROPHARYNX: no thrush or ulceration; poor dentition.   NECK: supple, no masses felt LYMPH:  no palpable lymphadenopathy in the cervical, axillary or inguinal regions LUNGS: clear to auscultation and  No wheeze or crackles HEART/CVS: regular rate & rhythm and no murmurs; No lower extremity edema ABDOMEN:abdomen soft, non-tender and normal bowel sounds Musculoskeletal:no cyanosis of digits and no clubbing;  PSYCH: alert & oriented x 3 with fluent speech NEURO: no focal motor/sensory deficits SKIN:  no rashes or significant lesions   LABORATORY DATA:  I have reviewed the data as listed    Component Value Date/Time   NA 141 05/02/2017 0932   NA 133 (L) 04/08/2014 1738   K 3.3 (L) 05/02/2017 0932   K 4.2 04/08/2014 1738   CL 102 05/02/2017 0932   CL 99 04/08/2014 1738   CO2 32 05/02/2017 0932   CO2 28 04/08/2014 1738   GLUCOSE 105 (H) 05/02/2017 0932   GLUCOSE 116 (H) 04/08/2014 1738   BUN 9 05/02/2017 0932   BUN 5 (L) 04/08/2014 1738   CREATININE 0.64 05/02/2017 0932   CREATININE 0.42 (L) 04/08/2014 1738    CALCIUM 8.5 (L) 05/02/2017 0932   CALCIUM 9.5 04/08/2014 1738   PROT 5.8 (L) 05/02/2017 0932   ALBUMIN 2.5 (L) 05/02/2017 0932   AST 29 05/02/2017 0932   ALT 12 (L) 05/02/2017 0932   ALKPHOS 80 05/02/2017 0932   BILITOT 0.5 05/02/2017 0932   GFRNONAA >60 05/02/2017 0932   GFRNONAA >60 04/08/2014 1738   GFRAA >60 05/02/2017 0932   GFRAA >60 04/08/2014 1738    No results found for: SPEP, UPEP  Lab Results  Component Value Date   WBC 6.1 05/02/2017   NEUTROABS 3.6 05/02/2017   HGB 9.2 (L) 05/02/2017   HCT 28.4 (L) 05/02/2017   MCV 82.5 05/02/2017   PLT 299 05/02/2017      Chemistry      Component Value Date/Time   NA 141 05/02/2017 0932   NA 133 (L) 04/08/2014 1738   K 3.3 (L) 05/02/2017 0932   K 4.2 04/08/2014 1738   CL 102 05/02/2017 0932   CL 99 04/08/2014 1738   CO2 32 05/02/2017 0932   CO2 28 04/08/2014 1738   BUN 9 05/02/2017 0932   BUN 5 (L) 04/08/2014 1738   CREATININE 0.64 05/02/2017 0932   CREATININE 0.42 (L) 04/08/2014 1738      Component Value Date/Time   CALCIUM 8.5 (L) 05/02/2017 0932   CALCIUM 9.5 04/08/2014 1738   ALKPHOS 80 05/02/2017 0932   AST 29 05/02/2017 0932   ALT 12 (L) 05/02/2017 0932   BILITOT 0.5 05/02/2017 0932       RADIOGRAPHIC STUDIES: I have personally reviewed the radiological images as listed and agreed with the findings in the report. No results found.   ASSESSMENT & PLAN:  Carcinoma of overlapping sites of left breast in female, estrogen receptor positive ([place]) # Metastatic breast cancer/lobular ER/PR positive HER-2/neu negative-July-AUG 2018- progression in the bone L3; also abdomen causing extrinsic compression of the small bowel [status post resection-C discussion below]. Patient currently on Faslodex.   # continue Faslodex today; I would recommend addition of abemaciclib [given intolerance to ibrance]  # Lytic lesion at L3- positive on PET scan.  Lumbar spine shows L3 lesion- on RT [last treatment on 8/27]  # Bone  metastases- Recommend X-geva; discussed the potential side effects of hypocalcemia and osteonecrosis of the jaw. We'll start today.   # Anemia-multifactorial hemoglobin - improving Hb 9. 3/improving. Monitor for now.  # follow up in 4 weeks/labs.      No orders of the defined types were placed in this encounter.      [name], MD 05/12/2017 8:29 PM

## 2017-05-06 MED FILL — VERZENIO 150 MG TAB: 150 | 28 days supply | Qty: 56 | Fill #0

## 2017-05-06 NOTE — Telephone Encounter (Addendum)
Oral Chemotherapy Pharmacist Encounter  I spoke with patient for overview of new oral chemotherapy medication: abemaciclib (Verzenio) for the treatment of metastatic breast cancer, planned duration until disease progression or unacceptable drug toxicity.   Pt is doing well.   Counseled patient on administration, dosing, side effects, safe handling, disposal, drug-food interactions, and monitoring. Patient will take 150 mg by mouth 2 (two) times daily with or without food.  Side effects include but not limited to: diarrhea, N/V, fatigue, headache, fatigue. Patient has picked up loperamide at her pharmacy and has this on hand to take if she needs it.   Reviewed with patient importance of keeping a medication schedule and plan for any missed doses.  Sophia Nelson voiced understanding and appreciation.   All questions answered.  Patient knows to call the office with questions or concerns. Oral Chemotherapy Navigation Clinic will continue to follow.  Thank you,  Darl Pikes, PharmD, BCPS Hematology/Oncology Clinical Pharmacist [facility] [place] [facility] [phone]  05/06/2017 1:23 PM

## 2017-05-06 NOTE — Telephone Encounter (Deleted)
Oral Oncology Patient Advocate Encounter  Prior Authorization for Sophia Nelson has been approved.    PA# [id number]  Effective dates: 05/06/2017 through 08/26/2017  Copay is $3055.90.  Oral Oncology Clinic will continue to follow.

## 2017-05-06 NOTE — Telephone Encounter (Deleted)
Oral Oncology Patient Advocate Encounter  Was successful in securing patient a grant from [employer] to provide copayment coverage for [place].  This will keep the out of pocket expense at $0.    Spoke with the patient in the room for his visit today.  The billing information is as follows and has been shared with [place].   Member ID: [id number] Group ID: [id number] RxBin: [id number]

## 2017-05-06 NOTE — Telephone Encounter (Signed)
Oral Oncology Patient Advocate Encounter  Prior Authorization for Sophia Nelson  has been approved.    PA# [id number]   Effective dates: 05/06/2017 through 11/01/2017  Patient has a 0.00 copay.  Called patient to let her know we will be mailing her medication.   Oral Oncology Clinic will continue to follow.    [place] Patient Advocate [phone] 05/06/2017 1:28 PM

## 2017-05-27 ENCOUNTER — Ambulatory Visit: Payer: Medicare HMO | Attending: Radiation Oncology | Admitting: Radiation Oncology

## 2017-05-27 MED FILL — VERZENIO 150 MG TAB: 150 | 28 days supply | Qty: 56 | Fill #1

## 2017-05-30 ENCOUNTER — Inpatient Hospital Stay: Payer: Medicare HMO

## 2017-05-30 ENCOUNTER — Inpatient Hospital Stay: Payer: Medicare HMO | Attending: Internal Medicine | Admitting: Internal Medicine

## 2017-05-30 ENCOUNTER — Telehealth: Payer: Self-pay | Admitting: *Deleted

## 2017-05-30 ENCOUNTER — Other Ambulatory Visit: Payer: Self-pay

## 2017-05-30 VITALS — BP 114/73 | HR 97 | Temp 98.5°F | Resp 16 | Wt 136.0 lb

## 2017-05-30 DIAGNOSIS — Z7982 Long term (current) use of aspirin: Secondary | ICD-10-CM | POA: Insufficient documentation

## 2017-05-30 DIAGNOSIS — G893 Neoplasm related pain (acute) (chronic): Secondary | ICD-10-CM | POA: Diagnosis not present

## 2017-05-30 DIAGNOSIS — Z923 Personal history of irradiation: Secondary | ICD-10-CM | POA: Diagnosis not present

## 2017-05-30 DIAGNOSIS — M25519 Pain in unspecified shoulder: Secondary | ICD-10-CM | POA: Diagnosis not present

## 2017-05-30 DIAGNOSIS — K219 Gastro-esophageal reflux disease without esophagitis: Secondary | ICD-10-CM | POA: Diagnosis not present

## 2017-05-30 DIAGNOSIS — E119 Type 2 diabetes mellitus without complications: Secondary | ICD-10-CM | POA: Insufficient documentation

## 2017-05-30 DIAGNOSIS — C50812 Malignant neoplasm of overlapping sites of left female breast: Secondary | ICD-10-CM | POA: Insufficient documentation

## 2017-05-30 DIAGNOSIS — D649 Anemia, unspecified: Secondary | ICD-10-CM | POA: Insufficient documentation

## 2017-05-30 DIAGNOSIS — C7951 Secondary malignant neoplasm of bone: Secondary | ICD-10-CM | POA: Diagnosis not present

## 2017-05-30 DIAGNOSIS — I10 Essential (primary) hypertension: Secondary | ICD-10-CM | POA: Diagnosis not present

## 2017-05-30 DIAGNOSIS — Z170 Estrogen receptor positive status [ER+]: Secondary | ICD-10-CM | POA: Insufficient documentation

## 2017-05-30 DIAGNOSIS — Z801 Family history of malignant neoplasm of trachea, bronchus and lung: Secondary | ICD-10-CM | POA: Insufficient documentation

## 2017-05-30 DIAGNOSIS — Z79899 Other long term (current) drug therapy: Secondary | ICD-10-CM

## 2017-05-30 DIAGNOSIS — J449 Chronic obstructive pulmonary disease, unspecified: Secondary | ICD-10-CM | POA: Diagnosis not present

## 2017-05-30 DIAGNOSIS — Z86000 Personal history of in-situ neoplasm of breast: Secondary | ICD-10-CM | POA: Diagnosis not present

## 2017-05-30 DIAGNOSIS — Z9013 Acquired absence of bilateral breasts and nipples: Secondary | ICD-10-CM | POA: Insufficient documentation

## 2017-05-30 DIAGNOSIS — G8929 Other chronic pain: Secondary | ICD-10-CM | POA: Diagnosis not present

## 2017-05-30 DIAGNOSIS — Z9981 Dependence on supplemental oxygen: Secondary | ICD-10-CM | POA: Insufficient documentation

## 2017-05-30 DIAGNOSIS — F17210 Nicotine dependence, cigarettes, uncomplicated: Secondary | ICD-10-CM | POA: Insufficient documentation

## 2017-05-30 DIAGNOSIS — Z79818 Long term (current) use of other agents affecting estrogen receptors and estrogen levels: Secondary | ICD-10-CM | POA: Diagnosis not present

## 2017-05-30 DIAGNOSIS — R110 Nausea: Secondary | ICD-10-CM | POA: Diagnosis not present

## 2017-05-30 DIAGNOSIS — M1990 Unspecified osteoarthritis, unspecified site: Secondary | ICD-10-CM | POA: Insufficient documentation

## 2017-05-30 LAB — COMPREHENSIVE METABOLIC PANEL
ALT: 14 U/L (ref 14–54)
AST: 25 U/L (ref 15–41)
Albumin: 2.9 g/dL — ABNORMAL LOW (ref 3.5–5.0)
Alkaline Phosphatase: 72 U/L (ref 38–126)
Anion gap: 13 (ref 5–15)
BUN: 8 mg/dL (ref 6–20)
CHLORIDE: 96 mmol/L — AB (ref 101–111)
CO2: 23 mmol/L (ref 22–32)
Calcium: 6.2 mg/dL — CL (ref 8.9–10.3)
Creatinine, Ser: 0.72 mg/dL (ref 0.44–1.00)
Glucose, Bld: 88 mg/dL (ref 65–99)
POTASSIUM: 3.1 mmol/L — AB (ref 3.5–5.1)
Sodium: 132 mmol/L — ABNORMAL LOW (ref 135–145)
Total Bilirubin: 0.9 mg/dL (ref 0.3–1.2)
Total Protein: 6.7 g/dL (ref 6.5–8.1)

## 2017-05-30 LAB — CBC WITH DIFFERENTIAL/PLATELET
Basophils Absolute: 0 10*3/uL (ref 0–0.1)
Basophils Relative: 1 %
EOS ABS: 0 10*3/uL (ref 0–0.7)
Eosinophils Relative: 1 %
HCT: 27.6 % — ABNORMAL LOW (ref 35.0–47.0)
HEMOGLOBIN: 9.1 g/dL — AB (ref 12.0–16.0)
LYMPHS ABS: 1.5 10*3/uL (ref 1.0–3.6)
LYMPHS PCT: 43 %
MCH: 26.9 pg (ref 26.0–34.0)
MCHC: 33 g/dL (ref 32.0–36.0)
MCV: 81.6 fL (ref 80.0–100.0)
Monocytes Absolute: 0.2 10*3/uL (ref 0.2–0.9)
Monocytes Relative: 5 %
NEUTROS PCT: 50 %
Neutro Abs: 1.7 10*3/uL (ref 1.4–6.5)
Platelets: 299 10*3/uL (ref 150–440)
RBC: 3.38 MIL/uL — AB (ref 3.80–5.20)
RDW: 20.9 % — ABNORMAL HIGH (ref 11.5–14.5)
WBC: 3.5 10*3/uL — AB (ref 3.6–11.0)

## 2017-05-30 MED ORDER — ERGOCALCIFEROL 1.25 MG (50000 UT) PO CAPS: 50000.0000 [IU] | ORAL_CAPSULE | ORAL | 1 refills | Status: DC

## 2017-05-30 MED ORDER — FULVESTRANT 250 MG/5ML IM SOLN
500.0000 mg | Freq: Once | INTRAMUSCULAR | Status: AC
  Administered 2017-05-30: 500 mg via INTRAMUSCULAR
  Filled 2017-05-30: qty 10

## 2017-05-30 MED ORDER — DENOSUMAB 120 MG/1.7ML ~~LOC~~ SOLN: 120.0000 mg | Freq: Once | SUBCUTANEOUS | Status: DC

## 2017-05-30 MED ORDER — OXYCODONE-ACETAMINOPHEN 5-325 MG PO TABS: 1.0000 | ORAL_TABLET | Freq: Three times a day (TID) | ORAL | 0 refills | Status: DC | PRN

## 2017-05-30 MED ORDER — METOCLOPRAMIDE HCL 10 MG PO TABS: 10.0000 mg | ORAL_TABLET | Freq: Four times a day (QID) | ORAL | 3 refills | Status: DC

## 2017-05-30 NOTE — Progress Notes (Signed)
Nutrition Assessment   Reason for Assessment:   Patient with weight loss, referral from inpatient RD  ASSESSMENT:  71 year old female with metastatic breast cancer with recurrence in abdomen and bone mets. Patient followed by Dr. Rogue Bussing.  Currently on faslodex.  Post radiation to L3 vertebral lytic lesion.  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  Met with patient today after MD appointment today. Patient reports appetite is so-so.  Reports that she usually only eats about 2 meals per day as does not get up until 10:00am.  Sometimes will eat sausage biscuit with egg and supper pork chop, mashed potatoes, vegetables.  Reports that she does not like the ensure/boost drinks.  Patient reports she has bouts of diarrhea and constipation.    Nutrition Focused Physical Exam: deferred as patient needing to get to infusion  Medications: colace, glipzide, prilosec, imodium  Labs: Na 132, K 3.1, calcium 6.2, albumin 2.9, corrected calcium 7.4, hgb 9.1  Anthropometrics:   Height: 60 inches Weight: 136 lb UBW: 166 lb 1.6 oz (7/18) BMI: 26  18% weight loss in the last 3 months, significant   Estimated Energy Needs  Kcals: 1860-2170 calories/d Protein: 93-108 g/d Fluid: 2.l L/d  NUTRITION DIAGNOSIS: Inadequate food and beverage intake related to GI function, cancer and cancer treatment as evidenced by 18% weight loss in the last 3 months   MALNUTRITION DIAGNOSIS: Patient meets criteria for severe malnutrition in chronic illness with 18% weight loss in the last 3 months and eating < 75% of estimated energy needs for > or equal to 1 month   INTERVENTION:   Encouraged small frequent meals especially with GI surgery Discussed foods high in calories and protein with examples provided Recommend MVI with Fe due to GI surgery and poor po intake. Discussed with Dr. Jacinto Reap.  MD adding calcium and Vit D today.  Recommend checking Vit B12 as  patient at risk of deficiency with GI surgery and has not been taking supplement.  Contact information provided to patient  MONITORING, EVALUATION, GOAL: weight trends, intake   NEXT VISIT: as needed.  Next appointment date not on day with RD at facility  [place]. Zenia Resides, [place], [place] Registered Dietitian [phone] (pager)

## 2017-05-30 NOTE — Progress Notes (Signed)
Critical Calcium 6.2 level called by [facility] in cancer center lab (11.56 am). Read back process performed.  Dr. Rogue Bussing informed of critical value at 1202 - read back process performed with MD.

## 2017-05-30 NOTE — Assessment & Plan Note (Addendum)
#  Metastatic breast cancer/lobular ER/PR positive HER-2/neu negative-July-AUG 2018- progression in the bone L3; also abdomen causing extrinsic compression of the small bowel [status post resection-C discussion below].  #  Patient currently on Faslodex+ Verzinio 150 mg BID-tolearting faily well except for mild Nausea.  # Nausea- ? verzinio- recommend continued anti-emetics- will call with name of medication- for refill.   # Lytic lesion at L3- positive on PET scan. Lumbar spine shows L3 lesion- on RT [last treatment on 8/27]; will plan to get an MRI next few months.  # Bone metastases-s/p X-geva; HOLD today because of severe hypocalcemia.   # Neck pain/bone pain from malignancy- continue oxycodone. New prescription given.  # Hypocalcemia- Ca- 7.4 after correction for low albumin. HOLD x-geva. No chair availability for calcium infusion today. Ca+vit D BID. Recommend vit D 50K/week. Recommend taking 2 Tums a day.  # Anemia-multifactorial hemoglobin - improving Hb 9. 3/improving. Monitor for now.  # follow up in 4 weeks/labs; faslodex; B12 [discussed with Julie]; magnesium levels; possible calcium infusion. 

## 2017-05-30 NOTE — Telephone Encounter (Signed)
Patient states she was to call back with name of medicine she has, it is Metoclopramide 10 mg four times a day

## 2017-05-30 NOTE — Telephone Encounter (Signed)
Patient was here for an office visit this morning, and stated she needed a refill on a medication, but she wasn't sure which one. I advised patient to call us with the name of the medication she needed when she got home. Patient called triage nurse Hassan Rowan, and notified her that the medication she needed was Metoclopramide.  I have refilled the Metoclopramide as directed, and I have sent it in to [facility]. Thanks!

## 2017-05-30 NOTE — Progress Notes (Signed)
[place] OFFICE PROGRESS NOTE  Patient Care Team: Donnie Coffin, MD as PCP - General (Family Medicine) Donnie Coffin, MD as Referring Physician (Family Medicine) Christene Lye, MD (General Surgery) Forest Gleason, MD (Oncology)  Cancer of left breast [facility])   Staging form: Breast, AJCC 7th Edition     Clinical: Stage IIIB (T4, N1, cM0(i+)) - Signed by Forest Gleason, MD on 08/09/2015     Pathologic: Stage IV (T4, N1, M1) - Signed by Forest Gleason, MD on 08/16/2015    Oncology History   # DEC 2016-  LEFT BREAST CA- LOBULAR CA ER/PR-Pos; her 2 NEG STAGE IV [H5K5G2- left breast/bil Ax LN/Media LN/RP LN; skeletal mets]; DEC 2016- LETROZOLE + IBRANCE; April 2017- Significant response to treatment- [Dr.Sankar]; s/p Left mastec & ALND [palliative]; cont Ibrance + Letrzole. Stopped Ibrance [May 2018/sec to Anemia]  # July 2018- PROGRESSION [PET scan-L1 lytic lesion; Small bowel extrinsic compression s/p lap resection + Lobular cancer; Dr.Davis ]  # AUG 9th th-FASLODEX; SEP 1 week- Verzinio 150 BID  # AUG 2018- s/p RT to L3 lytic lesion  # RIGHT BREAST CA [s/p mastectomy [facility]]     Carcinoma of overlapping sites of left breast in female, estrogen receptor positive ([place])    INTERVAL HISTORY:  Sophia Nelson 71 y.o.  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  Patient denies any tingling or numbness. Denies any cramps. Denies any headaches. Denies any back pain. Continues to complain of chronic shoulder pain with pain.  Patient complains of intermittent nausea- she is on antiemetics [unclear of the name of medication.]. Patient denies any worsening pain. She continues to be on narcotic pain medication-Percocet.  Denies any worsening shortness of breath with exertion and cough.    REVIEW OF SYSTEMS:  A complete 10 point review of system is done which is negative except mentioned above/history of present illness.   PAST MEDICAL HISTORY :  Past Medical History:  Diagnosis Date  . Abdominal distention   . AKI (acute kidney injury) ([place]) 02/24/2017  . Anemia   . Arthritis   . Asthma   . Back pain, chronic 03/15/2017  . Breast cancer ([place]) 2009   RT MASTECTOMY, DCIS  . Cancer of left breast ([place]) 08/09/2015   T4 N1 M1 tumor, INVASIVE LOBULAR CARCINOMA.  . Carcinoma of overlapping sites of left breast in female, estrogen receptor positive ([place]) 03/22/2016  . COPD (chronic obstructive pulmonary disease) ([place])   . Diabetes ([place]) 02/24/2017  . Diabetes mellitus without complication ([place])   . Duodenal obstruction   . Encounter for nasogastric (NG) tube placement   . Gastric outlet obstruction 02/24/2017  . GERD (gastroesophageal reflux disease)   . Headache    h/o migraines as a child  . Hypertension   . Neck pain 04/19/2016  . Pancreatitis, acute 03/04/2017  . Pneumonia 2015  . Recurrent low back pain 03/15/2017  . Shortness of breath dyspnea    with exertion    PAST SURGICAL HISTORY :   Past Surgical History:  Procedure Laterality Date  . ABDOMINAL HYSTERECTOMY    . BREAST SURGERY Right 2015   mastectomy  . ESOPHAGOGASTRODUODENOSCOPY N/A 02/25/2017   Procedure: ESOPHAGOGASTRODUODENOSCOPY (EGD);  Surgeon: Jonathon Bellows, MD;  Location: [facility] ENDOSCOPY;  Service: Endoscopy;  Laterality: N/A;  . ESOPHAGOGASTRODUODENOSCOPY (EGD) WITH PROPOFOL N/A 03/06/2017  Procedure: ESOPHAGOGASTRODUODENOSCOPY (EGD) WITH PROPOFOL;  Surgeon: [name], MD;  Location: [facility] ENDOSCOPY;  Service: Endoscopy;  Laterality: N/A;  . GASTROJEJUNOSTOMY N/A 03/08/2017   Procedure: Roux-en-Y gastrojejunostomy Bypass of Gastric Outlet Obstruction, small bowel resection;  Surgeon: Vickie Epley, MD;  Location: [facility] ORS;  Service: General;  Laterality: N/A;  . SIMPLE MASTECTOMY WITH AXILLARY  SENTINEL NODE BIOPSY Left 02/29/2016   Procedure: SIMPLE MASTECTOMY;  Surgeon: Christene Lye, MD;  Location: [facility] ORS;  Service: General;  Laterality: Left;  . TUBAL LIGATION      FAMILY HISTORY :   Family History  Problem Relation Age of Onset  . Lung cancer Father   . Breast cancer Neg Hx     SOCIAL HISTORY:   Social History  Substance Use Topics  . Smoking status: Current Some Day Smoker    Packs/day: 0.25    Years: 15.00    Types: Cigarettes    Last attempt to quit: 02/25/2016  . Smokeless tobacco: Never Used     Comment: currently as of 02-21-16 pt states she is only smoking 2-3 cigarettes per day  . 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  MEDICATIONS:  Current Outpatient Prescriptions  Medication Sig Dispense Refill  . Abemaciclib 150 MG TABS Take 150 mg by mouth 2 (two) times daily. 60 tablet 6  . ADVAIR DISKUS 500-50 MCG/DOSE AEPB Inhale 1 puff into the lungs 2 (two) times daily.     [name] albuterol (PROVENTIL HFA;VENTOLIN HFA) 108 (90 Base) MCG/ACT inhaler Inhale 2 puffs into the lungs every 6 (six) hours as needed for wheezing or shortness of breath.    [name] aspirin 81 MG tablet Take 81 mg by mouth daily.    [name] atorvastatin (LIPITOR) 10 MG tablet Take 10 mg by mouth daily.    [name] docusate sodium (COLACE) 100 MG capsule Take 100 mg by mouth 2 (two) times daily.    . feeding supplement, ENSURE ENLIVE, (ENSURE ENLIVE) LIQD Take 237 mLs by mouth 3 (three) times daily between meals. 237 mL 12  . fluticasone (FLONASE) 50 MCG/ACT nasal spray Place 2 sprays into both nostrils daily.    [name] GLIPIZIDE XL 2.5 MG 24 hr tablet Take 2.5 mg by mouth daily with breakfast.     . loperamide (IMODIUM) 2 MG capsule One pill after each loose stool; maximum upto 8 pills a day. 40 capsule 0  . loratadine (CLARITIN) 10 MG tablet Take 10 mg by mouth daily.    .  montelukast (SINGULAIR) 10 MG tablet Take 10 mg by mouth at bedtime.    [name] omeprazole (PRILOSEC) 20 MG capsule Take 20 mg by mouth every morning.     Derrill Memo ON 06/01/2017] oxyCODONE-acetaminophen (PERCOCET/ROXICET) 5-325 MG tablet Take 1 tablet by mouth every 8 (eight) hours as needed for severe pain. 90 tablet 0  . OXYGEN Inhale 2 L into the lungs continuous.     [name] SPIRIVA HANDIHALER 18 MCG inhalation capsule Place 18 mcg into inhaler and inhale every morning.     [name] tiZANidine (ZANAFLEX) 2 MG tablet Take 1 tablet by mouth 2 (two) times daily.    . ergocalciferol (VITAMIN D2) 50000 units capsule Take 1 capsule (50,000 Units total) by mouth once a week. 12 capsule 1  . lisinopril (PRINIVIL,ZESTRIL) 40 MG tablet Take 40 mg by mouth  daily.     . metoCLOPramide (REGLAN) 10 MG tablet Take 1 tablet (10 mg total) by mouth 4 (four) times daily. 120 tablet 3   No current facility-administered medications for this visit.     PHYSICAL EXAMINATION: ECOG PERFORMANCE STATUS: 0 - Asymptomatic  BP 114/73 (BP Location: Right Arm, Patient Position: Sitting)   Pulse 97   Temp 98.5 F (36.9 C) (Tympanic)   Resp 16   Wt 136 lb (61.7 kg)   BMI 26.56 kg/m   Filed Weights   05/30/17 1154  Weight: 136 lb (61.7 kg)    GENERAL: Well-nourished well-developed; Alert, no distress and comfortable. She is alone. She is walking herself. EYES: no pallor or icterus OROPHARYNX: no thrush or ulceration; poor dentition.   NECK: supple, no masses felt LYMPH:  no palpable lymphadenopathy in the cervical, axillary or inguinal regions LUNGS: clear to auscultation and  No wheeze or crackles HEART/CVS: regular rate & rhythm and no murmurs; No lower extremity edema ABDOMEN:abdomen soft, non-tender and normal bowel sounds Musculoskeletal:no cyanosis of digits and no clubbing;  PSYCH: alert & oriented x 3 with fluent speech NEURO: no focal motor/sensory deficits SKIN:  no rashes or significant lesions   LABORATORY  DATA:  I have reviewed the data as listed    Component Value Date/Time   NA 132 (L) 05/30/2017 1132   NA 133 (L) 04/08/2014 1738   K 3.1 (L) 05/30/2017 1132   K 4.2 04/08/2014 1738   CL 96 (L) 05/30/2017 1132   CL 99 04/08/2014 1738   CO2 23 05/30/2017 1132   CO2 28 04/08/2014 1738   GLUCOSE 88 05/30/2017 1132   GLUCOSE 116 (H) 04/08/2014 1738   BUN 8 05/30/2017 1132   BUN 5 (L) 04/08/2014 1738   CREATININE 0.72 05/30/2017 1132   CREATININE 0.42 (L) 04/08/2014 1738   CALCIUM 6.2 (LL) 05/30/2017 1132   CALCIUM 9.5 04/08/2014 1738   PROT 6.7 05/30/2017 1132   ALBUMIN 2.9 (L) 05/30/2017 1132   AST 25 05/30/2017 1132   ALT 14 05/30/2017 1132   ALKPHOS 72 05/30/2017 1132   BILITOT 0.9 05/30/2017 1132   GFRNONAA >60 05/30/2017 1132   GFRNONAA >60 04/08/2014 1738   GFRAA >60 05/30/2017 1132   GFRAA >60 04/08/2014 1738    No results found for: SPEP, UPEP  Lab Results  Component Value Date   WBC 3.5 (L) 05/30/2017   NEUTROABS 1.7 05/30/2017   HGB 9.1 (L) 05/30/2017   HCT 27.6 (L) 05/30/2017   MCV 81.6 05/30/2017   PLT 299 05/30/2017      Chemistry      Component Value Date/Time   NA 132 (L) 05/30/2017 1132   NA 133 (L) 04/08/2014 1738   K 3.1 (L) 05/30/2017 1132   K 4.2 04/08/2014 1738   CL 96 (L) 05/30/2017 1132   CL 99 04/08/2014 1738   CO2 23 05/30/2017 1132   CO2 28 04/08/2014 1738   BUN 8 05/30/2017 1132   BUN 5 (L) 04/08/2014 1738   CREATININE 0.72 05/30/2017 1132   CREATININE 0.42 (L) 04/08/2014 1738      Component Value Date/Time   CALCIUM 6.2 (LL) 05/30/2017 1132   CALCIUM 9.5 04/08/2014 1738   ALKPHOS 72 05/30/2017 1132   AST 25 05/30/2017 1132   ALT 14 05/30/2017 1132   BILITOT 0.9 05/30/2017 1132       RADIOGRAPHIC STUDIES: I have personally reviewed the radiological images as listed and agreed with the findings in the  report. No results found.   ASSESSMENT & PLAN:  Carcinoma of overlapping sites of left breast in female, estrogen  receptor positive ([place]) # Metastatic breast cancer/lobular ER/PR positive HER-2/neu negative-July-AUG 2018- progression in the bone L3; also abdomen causing extrinsic compression of the small bowel [status post resection-C discussion below].  #  Patient currently on Faslodex+ Verzinio 150 mg BID-tolearting faily well except for mild Nausea.  # Nausea- ? verzinio- recommend continued anti-emetics- will call with name of medication- for refill.   # Lytic lesion at L3- positive on PET scan. Lumbar spine shows L3 lesion- on RT [last treatment on 8/27]; will plan to get an MRI next few months.  # Bone metastases-s/p X-geva; HOLD today because of severe hypocalcemia.   # Neck pain/bone pain from malignancy- continue oxycodone. New prescription given.  # Hypocalcemia- Ca- 7.4 after correction for low albumin. HOLD x-geva. No chair availability for calcium infusion today. Ca+vit D BID. Recommend vit D 50K/week. Recommend taking 2 Tums a day.  # Anemia-multifactorial hemoglobin - improving Hb 9. 3/improving. Monitor for now.  # follow up in 4 weeks/labs; faslodex; B12 [discussed with Julie]; magnesium levels; possible calcium infusion.   Orders Placed This Encounter  Procedures  . Vitamin B12    Standing Status:   Future    Standing Expiration Date:   05/30/2018  . CBC with Differential/Platelet    Standing Status:   Future    Standing Expiration Date:   05/30/2018  . Comprehensive metabolic panel    Standing Status:   Future    Standing Expiration Date:   05/30/2018  . Magnesium    Standing Status:   Future    Standing Expiration Date:   05/30/2018       [name], MD 05/31/2017 8:17 AM

## 2017-06-08 ENCOUNTER — Other Ambulatory Visit: Payer: Self-pay

## 2017-06-08 ENCOUNTER — Inpatient Hospital Stay
Admission: EM | Admit: 2017-06-08 | Discharge: 2017-06-17 | DRG: 871 | Disposition: A | Discharge status: Home / Self Care | Payer: Medicare HMO | Attending: Internal Medicine | Admitting: Internal Medicine

## 2017-06-08 ENCOUNTER — Emergency Department: Payer: Medicare HMO

## 2017-06-08 DIAGNOSIS — Z9884 Bariatric surgery status: Secondary | ICD-10-CM

## 2017-06-08 DIAGNOSIS — Z515 Encounter for palliative care: Secondary | ICD-10-CM

## 2017-06-08 DIAGNOSIS — C50812 Malignant neoplasm of overlapping sites of left female breast: Secondary | ICD-10-CM | POA: Diagnosis present

## 2017-06-08 DIAGNOSIS — F17210 Nicotine dependence, cigarettes, uncomplicated: Secondary | ICD-10-CM | POA: Diagnosis present

## 2017-06-08 DIAGNOSIS — A419 Sepsis, unspecified organism: Principal | ICD-10-CM | POA: Diagnosis present

## 2017-06-08 DIAGNOSIS — J9690 Respiratory failure, unspecified, unspecified whether with hypoxia or hypercapnia: Secondary | ICD-10-CM

## 2017-06-08 DIAGNOSIS — E1165 Type 2 diabetes mellitus with hyperglycemia: Secondary | ICD-10-CM | POA: Diagnosis present

## 2017-06-08 DIAGNOSIS — N3090 Cystitis, unspecified without hematuria: Secondary | ICD-10-CM | POA: Diagnosis present

## 2017-06-08 DIAGNOSIS — Z452 Encounter for adjustment and management of vascular access device: Secondary | ICD-10-CM

## 2017-06-08 DIAGNOSIS — C7951 Secondary malignant neoplasm of bone: Secondary | ICD-10-CM | POA: Diagnosis present

## 2017-06-08 DIAGNOSIS — E162 Hypoglycemia, unspecified: Secondary | ICD-10-CM

## 2017-06-08 DIAGNOSIS — D638 Anemia in other chronic diseases classified elsewhere: Secondary | ICD-10-CM | POA: Diagnosis present

## 2017-06-08 DIAGNOSIS — J449 Chronic obstructive pulmonary disease, unspecified: Secondary | ICD-10-CM | POA: Diagnosis present

## 2017-06-08 DIAGNOSIS — E785 Hyperlipidemia, unspecified: Secondary | ICD-10-CM | POA: Diagnosis present

## 2017-06-08 DIAGNOSIS — K219 Gastro-esophageal reflux disease without esophagitis: Secondary | ICD-10-CM | POA: Diagnosis present

## 2017-06-08 DIAGNOSIS — R571 Hypovolemic shock: Secondary | ICD-10-CM | POA: Diagnosis present

## 2017-06-08 DIAGNOSIS — E871 Hypo-osmolality and hyponatremia: Secondary | ICD-10-CM | POA: Diagnosis present

## 2017-06-08 DIAGNOSIS — N179 Acute kidney failure, unspecified: Secondary | ICD-10-CM

## 2017-06-08 DIAGNOSIS — Z9013 Acquired absence of bilateral breasts and nipples: Secondary | ICD-10-CM

## 2017-06-08 DIAGNOSIS — J9610 Chronic respiratory failure, unspecified whether with hypoxia or hypercapnia: Secondary | ICD-10-CM | POA: Diagnosis present

## 2017-06-08 DIAGNOSIS — E11649 Type 2 diabetes mellitus with hypoglycemia without coma: Secondary | ICD-10-CM | POA: Diagnosis not present

## 2017-06-08 DIAGNOSIS — C50919 Malignant neoplasm of unspecified site of unspecified female breast: Secondary | ICD-10-CM

## 2017-06-08 DIAGNOSIS — J90 Pleural effusion, not elsewhere classified: Secondary | ICD-10-CM | POA: Diagnosis present

## 2017-06-08 DIAGNOSIS — I959 Hypotension, unspecified: Secondary | ICD-10-CM

## 2017-06-08 DIAGNOSIS — R55 Syncope and collapse: Secondary | ICD-10-CM

## 2017-06-08 DIAGNOSIS — E876 Hypokalemia: Secondary | ICD-10-CM | POA: Diagnosis present

## 2017-06-08 DIAGNOSIS — I10 Essential (primary) hypertension: Secondary | ICD-10-CM | POA: Diagnosis present

## 2017-06-08 DIAGNOSIS — R6521 Severe sepsis with septic shock: Secondary | ICD-10-CM | POA: Diagnosis present

## 2017-06-08 DIAGNOSIS — K5900 Constipation, unspecified: Secondary | ICD-10-CM | POA: Diagnosis not present

## 2017-06-08 DIAGNOSIS — N170 Acute kidney failure with tubular necrosis: Secondary | ICD-10-CM | POA: Diagnosis present

## 2017-06-08 DIAGNOSIS — G8929 Other chronic pain: Secondary | ICD-10-CM | POA: Diagnosis present

## 2017-06-08 DIAGNOSIS — Z170 Estrogen receptor positive status [ER+]: Secondary | ICD-10-CM

## 2017-06-08 DIAGNOSIS — Z7982 Long term (current) use of aspirin: Secondary | ICD-10-CM

## 2017-06-08 DIAGNOSIS — Z7984 Long term (current) use of oral hypoglycemic drugs: Secondary | ICD-10-CM

## 2017-06-08 DIAGNOSIS — N390 Urinary tract infection, site not specified: Secondary | ICD-10-CM

## 2017-06-08 DIAGNOSIS — Z853 Personal history of malignant neoplasm of breast: Secondary | ICD-10-CM

## 2017-06-08 DIAGNOSIS — Z7189 Other specified counseling: Secondary | ICD-10-CM

## 2017-06-08 DIAGNOSIS — Z79899 Other long term (current) drug therapy: Secondary | ICD-10-CM

## 2017-06-08 DIAGNOSIS — R52 Pain, unspecified: Secondary | ICD-10-CM

## 2017-06-08 DIAGNOSIS — Z9981 Dependence on supplemental oxygen: Secondary | ICD-10-CM

## 2017-06-08 DIAGNOSIS — T68XXXA Hypothermia, initial encounter: Secondary | ICD-10-CM

## 2017-06-08 HISTORY — DX: Malignant neoplasm of abdomen: C76.2

## 2017-06-08 LAB — CK: CK TOTAL: 502 U/L — AB (ref 38–234)

## 2017-06-08 LAB — BASIC METABOLIC PANEL
Anion gap: 12 (ref 5–15)
BUN: 24 mg/dL — ABNORMAL HIGH (ref 6–20)
CALCIUM: 5.2 mg/dL — AB (ref 8.9–10.3)
CHLORIDE: 92 mmol/L — AB (ref 101–111)
CO2: 24 mmol/L (ref 22–32)
Creatinine, Ser: 2.88 mg/dL — ABNORMAL HIGH (ref 0.44–1.00)
GFR calc Af Amer: 18 mL/min — ABNORMAL LOW (ref >60)
GFR, EST NON AFRICAN AMERICAN: 16 mL/min — AB (ref >60)
GLUCOSE: 297 mg/dL — AB (ref 65–99)
POTASSIUM: 2.7 mmol/L — AB (ref 3.5–5.1)
SODIUM: 128 mmol/L — AB (ref 135–145)

## 2017-06-08 LAB — CBC
HCT: 24.8 % — ABNORMAL LOW (ref 35.0–47.0)
HEMOGLOBIN: 8.3 g/dL — AB (ref 12.0–16.0)
MCH: 27.6 pg (ref 26.0–34.0)
MCHC: 33.4 g/dL (ref 32.0–36.0)
MCV: 82.6 fL (ref 80.0–100.0)
Platelets: 249 10*3/uL (ref 150–440)
RBC: 3.01 MIL/uL — AB (ref 3.80–5.20)
RDW: 22 % — ABNORMAL HIGH (ref 11.5–14.5)
WBC: 3.7 10*3/uL (ref 3.6–11.0)

## 2017-06-08 LAB — GLUCOSE, CAPILLARY
GLUCOSE-CAPILLARY: 191 mg/dL — AB (ref 65–99)
GLUCOSE-CAPILLARY: 46 mg/dL — AB (ref 65–99)

## 2017-06-08 LAB — LACTIC ACID, PLASMA: LACTIC ACID, VENOUS: 1.3 mmol/L (ref 0.5–1.9)

## 2017-06-08 MED ORDER — SODIUM CHLORIDE 0.9 % IV BOLUS (SEPSIS)
1000.0000 mL | Freq: Once | INTRAVENOUS | Status: AC
  Administered 2017-06-08: 1000 mL via INTRAVENOUS

## 2017-06-08 MED ORDER — DEXTROSE 50 % IV SOLN
INTRAVENOUS | Status: AC
  Filled 2017-06-08: qty 50

## 2017-06-08 MED ORDER — DEXTROSE 50 % IV SOLN
1.0000 | Freq: Once | INTRAVENOUS | Status: AC
  Administered 2017-06-08: 50 mL via INTRAVENOUS

## 2017-06-08 NOTE — ED Triage Notes (Signed)
Per EMS: patient had unwitnessed fall at home. Patient found on floor. Patient is a breast cancer patient that has spread to abdomen. Patient CBG was 40 at EMS arrival. Patient given 'sugary drink' and ham sandwich by EMS and CBG increased to 70. Patient always on 2L [place].

## 2017-06-08 NOTE — ED Notes (Signed)
ED Provider at bedside. 

## 2017-06-08 NOTE — ED Notes (Signed)
Bear hugger warming blanket placed on patient

## 2017-06-08 NOTE — ED Notes (Signed)
Patient placed in [place]

## 2017-06-08 NOTE — ED Notes (Signed)
Patient given 4 ounces orange juice 

## 2017-06-09 ENCOUNTER — Encounter: Payer: Self-pay | Admitting: Adult Health

## 2017-06-09 ENCOUNTER — Inpatient Hospital Stay: Payer: Medicare HMO

## 2017-06-09 DIAGNOSIS — J9610 Chronic respiratory failure, unspecified whether with hypoxia or hypercapnia: Secondary | ICD-10-CM | POA: Diagnosis present

## 2017-06-09 DIAGNOSIS — N3090 Cystitis, unspecified without hematuria: Secondary | ICD-10-CM | POA: Diagnosis present

## 2017-06-09 DIAGNOSIS — J449 Chronic obstructive pulmonary disease, unspecified: Secondary | ICD-10-CM | POA: Diagnosis present

## 2017-06-09 DIAGNOSIS — N390 Urinary tract infection, site not specified: Secondary | ICD-10-CM

## 2017-06-09 DIAGNOSIS — E2740 Unspecified adrenocortical insufficiency: Secondary | ICD-10-CM | POA: Diagnosis not present

## 2017-06-09 DIAGNOSIS — Z170 Estrogen receptor positive status [ER+]: Secondary | ICD-10-CM | POA: Diagnosis not present

## 2017-06-09 DIAGNOSIS — I10 Essential (primary) hypertension: Secondary | ICD-10-CM | POA: Diagnosis present

## 2017-06-09 DIAGNOSIS — Z87440 Personal history of urinary (tract) infections: Secondary | ICD-10-CM | POA: Diagnosis not present

## 2017-06-09 DIAGNOSIS — N170 Acute kidney failure with tubular necrosis: Secondary | ICD-10-CM | POA: Diagnosis present

## 2017-06-09 DIAGNOSIS — A419 Sepsis, unspecified organism: Secondary | ICD-10-CM | POA: Diagnosis present

## 2017-06-09 DIAGNOSIS — E11649 Type 2 diabetes mellitus with hypoglycemia without coma: Secondary | ICD-10-CM | POA: Diagnosis not present

## 2017-06-09 DIAGNOSIS — Z9012 Acquired absence of left breast and nipple: Secondary | ICD-10-CM | POA: Diagnosis not present

## 2017-06-09 DIAGNOSIS — Z853 Personal history of malignant neoplasm of breast: Secondary | ICD-10-CM | POA: Diagnosis not present

## 2017-06-09 DIAGNOSIS — C50812 Malignant neoplasm of overlapping sites of left female breast: Secondary | ICD-10-CM | POA: Diagnosis present

## 2017-06-09 DIAGNOSIS — C7951 Secondary malignant neoplasm of bone: Secondary | ICD-10-CM | POA: Diagnosis present

## 2017-06-09 DIAGNOSIS — Z515 Encounter for palliative care: Secondary | ICD-10-CM | POA: Diagnosis not present

## 2017-06-09 DIAGNOSIS — Z9013 Acquired absence of bilateral breasts and nipples: Secondary | ICD-10-CM | POA: Diagnosis not present

## 2017-06-09 DIAGNOSIS — D638 Anemia in other chronic diseases classified elsewhere: Secondary | ICD-10-CM | POA: Diagnosis present

## 2017-06-09 DIAGNOSIS — E876 Hypokalemia: Secondary | ICD-10-CM | POA: Diagnosis present

## 2017-06-09 DIAGNOSIS — R6521 Severe sepsis with septic shock: Secondary | ICD-10-CM | POA: Diagnosis not present

## 2017-06-09 DIAGNOSIS — R571 Hypovolemic shock: Secondary | ICD-10-CM | POA: Diagnosis present

## 2017-06-09 DIAGNOSIS — C7989 Secondary malignant neoplasm of other specified sites: Secondary | ICD-10-CM | POA: Diagnosis not present

## 2017-06-09 DIAGNOSIS — Z86000 Personal history of in-situ neoplasm of breast: Secondary | ICD-10-CM | POA: Diagnosis not present

## 2017-06-09 DIAGNOSIS — Z7189 Other specified counseling: Secondary | ICD-10-CM | POA: Diagnosis not present

## 2017-06-09 DIAGNOSIS — R55 Syncope and collapse: Secondary | ICD-10-CM | POA: Diagnosis present

## 2017-06-09 DIAGNOSIS — K219 Gastro-esophageal reflux disease without esophagitis: Secondary | ICD-10-CM | POA: Diagnosis present

## 2017-06-09 DIAGNOSIS — E871 Hypo-osmolality and hyponatremia: Secondary | ICD-10-CM | POA: Diagnosis present

## 2017-06-09 DIAGNOSIS — J90 Pleural effusion, not elsewhere classified: Secondary | ICD-10-CM | POA: Diagnosis present

## 2017-06-09 DIAGNOSIS — Z792 Long term (current) use of antibiotics: Secondary | ICD-10-CM | POA: Diagnosis not present

## 2017-06-09 DIAGNOSIS — C50919 Malignant neoplasm of unspecified site of unspecified female breast: Secondary | ICD-10-CM | POA: Diagnosis not present

## 2017-06-09 DIAGNOSIS — Z9981 Dependence on supplemental oxygen: Secondary | ICD-10-CM | POA: Diagnosis not present

## 2017-06-09 DIAGNOSIS — Z79818 Long term (current) use of other agents affecting estrogen receptors and estrogen levels: Secondary | ICD-10-CM | POA: Diagnosis not present

## 2017-06-09 DIAGNOSIS — I959 Hypotension, unspecified: Secondary | ICD-10-CM | POA: Diagnosis not present

## 2017-06-09 LAB — CBC
HEMATOCRIT: 25.2 % — AB (ref 35.0–47.0)
HEMOGLOBIN: 8.4 g/dL — AB (ref 12.0–16.0)
MCH: 27.7 pg (ref 26.0–34.0)
MCHC: 33.3 g/dL (ref 32.0–36.0)
MCV: 83.3 fL (ref 80.0–100.0)
Platelets: 229 10*3/uL (ref 150–440)
RBC: 3.02 MIL/uL — AB (ref 3.80–5.20)
RDW: 22.8 % — ABNORMAL HIGH (ref 11.5–14.5)
WBC: 2.7 10*3/uL — ABNORMAL LOW (ref 3.6–11.0)

## 2017-06-09 LAB — URINALYSIS, COMPLETE (UACMP) WITH MICROSCOPIC
Bilirubin Urine: NEGATIVE
GLUCOSE, UA: 50 mg/dL — AB
KETONES UR: 5 mg/dL — AB
LEUKOCYTES UA: NEGATIVE
Nitrite: NEGATIVE
PROTEIN: 100 mg/dL — AB
SQUAMOUS EPITHELIAL / LPF: NONE SEEN
Specific Gravity, Urine: 1.015 (ref 1.005–1.030)
pH: 5 (ref 5.0–8.0)

## 2017-06-09 LAB — COMPREHENSIVE METABOLIC PANEL
ALK PHOS: 147 U/L — AB (ref 38–126)
ALT: 38 U/L (ref 14–54)
AST: 73 U/L — ABNORMAL HIGH (ref 15–41)
Albumin: 1.9 g/dL — ABNORMAL LOW (ref 3.5–5.0)
Anion gap: 6 (ref 5–15)
BILIRUBIN TOTAL: 0.9 mg/dL (ref 0.3–1.2)
BUN: 16 mg/dL (ref 6–20)
CALCIUM: 5 mg/dL — AB (ref 8.9–10.3)
CO2: 22 mmol/L (ref 22–32)
CREATININE: 1.59 mg/dL — AB (ref 0.44–1.00)
Chloride: 106 mmol/L (ref 101–111)
GFR calc Af Amer: 37 mL/min — ABNORMAL LOW (ref >60)
GFR calc non Af Amer: 32 mL/min — ABNORMAL LOW (ref >60)
GLUCOSE: 168 mg/dL — AB (ref 65–99)
Potassium: 3 mmol/L — ABNORMAL LOW (ref 3.5–5.1)
SODIUM: 134 mmol/L — AB (ref 135–145)
TOTAL PROTEIN: 5 g/dL — AB (ref 6.5–8.1)

## 2017-06-09 LAB — GLUCOSE, CAPILLARY
GLUCOSE-CAPILLARY: 197 mg/dL — AB (ref 65–99)
GLUCOSE-CAPILLARY: 135 mg/dL — AB (ref 65–99)
Glucose-Capillary: 204 mg/dL — ABNORMAL HIGH (ref 65–99)
Glucose-Capillary: 112 mg/dL — ABNORMAL HIGH (ref 65–99)
Glucose-Capillary: 186 mg/dL — ABNORMAL HIGH (ref 65–99)
Glucose-Capillary: 178 mg/dL — ABNORMAL HIGH (ref 65–99)

## 2017-06-09 LAB — PHOSPHORUS

## 2017-06-09 LAB — LACTIC ACID, PLASMA: Lactic Acid, Venous: 1.1 mmol/L (ref 0.5–1.9)

## 2017-06-09 LAB — MAGNESIUM
Magnesium: 1.4 mg/dL — ABNORMAL LOW (ref 1.7–2.4)
Magnesium: 1.8 mg/dL (ref 1.7–2.4)

## 2017-06-09 LAB — PROCALCITONIN: PROCALCITONIN: 1.51 ng/mL

## 2017-06-09 LAB — TROPONIN I

## 2017-06-09 LAB — MRSA PCR SCREENING: MRSA BY PCR: NEGATIVE

## 2017-06-09 MED ORDER — ATORVASTATIN CALCIUM 20 MG PO TABS
10.0000 mg | ORAL_TABLET | Freq: Every day | ORAL | Status: DC
  Administered 2017-06-09 – 2017-06-16 (×8): 10 mg via ORAL
  Filled 2017-06-09 (×8): qty 1

## 2017-06-09 MED ORDER — SODIUM CHLORIDE 0.9 % IV BOLUS (SEPSIS)
1000.0000 mL | INTRAVENOUS | Status: AC
  Administered 2017-06-09 (×2): 1000 mL via INTRAVENOUS

## 2017-06-09 MED ORDER — SODIUM CHLORIDE 0.9 % IV SOLN
1.0000 g | Freq: Once | INTRAVENOUS | Status: AC
  Administered 2017-06-09: 1 g via INTRAVENOUS
  Filled 2017-06-09: qty 10

## 2017-06-09 MED ORDER — ENOXAPARIN SODIUM 40 MG/0.4ML ~~LOC~~ SOLN: 30.0000 mg | SUBCUTANEOUS | Status: DC

## 2017-06-09 MED ORDER — ASPIRIN EC 81 MG PO TBEC
81.0000 mg | DELAYED_RELEASE_TABLET | Freq: Every day | ORAL | Status: DC
  Administered 2017-06-09 – 2017-06-17 (×9): 81 mg via ORAL
  Filled 2017-06-09 (×9): qty 1

## 2017-06-09 MED ORDER — OXYCODONE-ACETAMINOPHEN 5-325 MG PO TABS
1.0000 | ORAL_TABLET | Freq: Three times a day (TID) | ORAL | Status: DC | PRN
  Administered 2017-06-12 – 2017-06-16 (×5): 1 via ORAL
  Filled 2017-06-09 (×5): qty 1

## 2017-06-09 MED ORDER — DEXTROSE 5 % IV SOLN
1.0000 g | Freq: Once | INTRAVENOUS | Status: AC
  Administered 2017-06-09: 1 g via INTRAVENOUS
  Filled 2017-06-09: qty 10

## 2017-06-09 MED ORDER — MONTELUKAST SODIUM 10 MG PO TABS
10.0000 mg | ORAL_TABLET | Freq: Every day | ORAL | Status: DC
  Administered 2017-06-09: 10 mg via ORAL
  Filled 2017-06-09: qty 1

## 2017-06-09 MED ORDER — LOPERAMIDE HCL 2 MG PO CAPS: 2.0000 mg | ORAL_CAPSULE | ORAL | Status: DC | PRN

## 2017-06-09 MED ORDER — MOMETASONE FURO-FORMOTEROL FUM 200-5 MCG/ACT IN AERO
2.0000 | INHALATION_SPRAY | Freq: Two times a day (BID) | RESPIRATORY_TRACT | Status: DC
  Administered 2017-06-09 – 2017-06-17 (×17): 2 via RESPIRATORY_TRACT
  Filled 2017-06-09: qty 8.8

## 2017-06-09 MED ORDER — ACETAMINOPHEN 325 MG PO TABS
650.0000 mg | ORAL_TABLET | Freq: Four times a day (QID) | ORAL | Status: DC | PRN
  Administered 2017-06-09: 650 mg via ORAL
  Filled 2017-06-09: qty 2

## 2017-06-09 MED ORDER — SENNOSIDES-DOCUSATE SODIUM 8.6-50 MG PO TABS
1.0000 | ORAL_TABLET | Freq: Every evening | ORAL | Status: DC | PRN
Start: 2017-06-09 — End: 2017-06-17

## 2017-06-09 MED ORDER — MORPHINE SULFATE (PF) 2 MG/ML IV SOLN
INTRAVENOUS | Status: AC
  Filled 2017-06-09: qty 1

## 2017-06-09 MED ORDER — TIZANIDINE HCL 2 MG PO TABS
2.0000 mg | ORAL_TABLET | Freq: Two times a day (BID) | ORAL | Status: DC
  Filled 2017-06-09 (×2): qty 1

## 2017-06-09 MED ORDER — SODIUM CHLORIDE 0.9 % IV SOLN
2.0000 g | Freq: Once | INTRAVENOUS | Status: AC
  Administered 2017-06-09: 2 g via INTRAVENOUS
  Filled 2017-06-09: qty 20

## 2017-06-09 MED ORDER — IPRATROPIUM BROMIDE 0.02 % IN SOLN: 0.5000 mg | Freq: Four times a day (QID) | RESPIRATORY_TRACT | Status: DC | PRN

## 2017-06-09 MED ORDER — ENSURE ENLIVE PO LIQD
237.0000 mL | Freq: Three times a day (TID) | ORAL | Status: DC
  Administered 2017-06-10 – 2017-06-15 (×16): 237 mL via ORAL

## 2017-06-09 MED ORDER — POTASSIUM CHLORIDE IN NACL 20-0.9 MEQ/L-% IV SOLN
INTRAVENOUS | Status: DC
  Administered 2017-06-09 (×3): via INTRAVENOUS
  Filled 2017-06-09 (×5): qty 1000

## 2017-06-09 MED ORDER — POTASSIUM CHLORIDE 10 MEQ/100ML IV SOLN
10.0000 meq | INTRAVENOUS | Status: AC
  Administered 2017-06-09 (×2): 10 meq via INTRAVENOUS
  Filled 2017-06-09 (×4): qty 100

## 2017-06-09 MED ORDER — NOREPINEPHRINE BITARTRATE 1 MG/ML IV SOLN
0.0000 ug/min | INTRAVENOUS | Status: DC
  Administered 2017-06-09: 20 ug/min via INTRAVENOUS
  Administered 2017-06-10: 14 ug/min via INTRAVENOUS
  Administered 2017-06-12: 2 ug/min via INTRAVENOUS
  Filled 2017-06-09 (×4): qty 16

## 2017-06-09 MED ORDER — INSULIN ASPART 100 UNIT/ML ~~LOC~~ SOLN
0.0000 [IU] | SUBCUTANEOUS | Status: DC
  Administered 2017-06-09: 1 [IU] via SUBCUTANEOUS
  Administered 2017-06-09 (×2): 2 [IU] via SUBCUTANEOUS
  Administered 2017-06-10: 1 [IU] via SUBCUTANEOUS
  Administered 2017-06-10: 2 [IU] via SUBCUTANEOUS
  Filled 2017-06-09 (×5): qty 1

## 2017-06-09 MED ORDER — DOCUSATE SODIUM 100 MG PO CAPS
100.0000 mg | ORAL_CAPSULE | Freq: Two times a day (BID) | ORAL | Status: DC
  Administered 2017-06-09 – 2017-06-17 (×16): 100 mg via ORAL
  Filled 2017-06-09 (×16): qty 1

## 2017-06-09 MED ORDER — HEPARIN SODIUM (PORCINE) 5000 UNIT/ML IJ SOLN
5000.0000 [IU] | Freq: Three times a day (TID) | INTRAMUSCULAR | Status: DC
  Administered 2017-06-09 – 2017-06-12 (×10): 5000 [IU] via SUBCUTANEOUS
  Filled 2017-06-09 (×10): qty 1

## 2017-06-09 MED ORDER — MORPHINE SULFATE (PF) 2 MG/ML IV SOLN
2.0000 mg | INTRAVENOUS | Status: DC | PRN
  Administered 2017-06-09: 2 mg via INTRAVENOUS

## 2017-06-09 MED ORDER — PANTOPRAZOLE SODIUM 40 MG PO TBEC
40.0000 mg | DELAYED_RELEASE_TABLET | Freq: Every day | ORAL | Status: DC
  Administered 2017-06-09 – 2017-06-17 (×9): 40 mg via ORAL
  Filled 2017-06-09 (×9): qty 1

## 2017-06-09 MED ORDER — ABEMACICLIB 150 MG PO TABS
150.0000 mg | ORAL_TABLET | Freq: Two times a day (BID) | ORAL | Status: DC
  Administered 2017-06-10 – 2017-06-12 (×5): 150 mg via ORAL
  Filled 2017-06-09 (×8): qty 1

## 2017-06-09 MED ORDER — DEXTROSE 5 % IV SOLN
2.0000 g | INTRAVENOUS | Status: DC
  Administered 2017-06-09 – 2017-06-11 (×3): 2 g via INTRAVENOUS
  Filled 2017-06-09 (×5): qty 2

## 2017-06-09 MED ORDER — SODIUM CHLORIDE 0.9% FLUSH
3.0000 mL | Freq: Two times a day (BID) | INTRAVENOUS | Status: DC
  Administered 2017-06-09 – 2017-06-17 (×17): 3 mL via INTRAVENOUS

## 2017-06-09 MED ORDER — ONDANSETRON HCL 4 MG PO TABS: 4.0000 mg | ORAL_TABLET | Freq: Four times a day (QID) | ORAL | Status: DC | PRN

## 2017-06-09 MED ORDER — LORATADINE 10 MG PO TABS
10.0000 mg | ORAL_TABLET | Freq: Every day | ORAL | Status: DC
  Administered 2017-06-09 – 2017-06-17 (×9): 10 mg via ORAL
  Filled 2017-06-09 (×9): qty 1

## 2017-06-09 MED ORDER — TIOTROPIUM BROMIDE MONOHYDRATE 18 MCG IN CAPS
18.0000 ug | ORAL_CAPSULE | RESPIRATORY_TRACT | Status: DC
  Administered 2017-06-09 – 2017-06-17 (×9): 18 ug via RESPIRATORY_TRACT
  Filled 2017-06-09 (×2): qty 5

## 2017-06-09 MED ORDER — SODIUM CHLORIDE 0.9 % IV BOLUS (SEPSIS)
1000.0000 mL | Freq: Once | INTRAVENOUS | Status: AC
  Administered 2017-06-09: 1000 mL via INTRAVENOUS

## 2017-06-09 MED ORDER — POTASSIUM CHLORIDE 10 MEQ/100ML IV SOLN
10.0000 meq | INTRAVENOUS | Status: DC
  Filled 2017-06-09 (×3): qty 100

## 2017-06-09 MED ORDER — GUAIFENESIN ER 600 MG PO TB12: 600.0000 mg | ORAL_TABLET | Freq: Two times a day (BID) | ORAL | Status: DC | PRN

## 2017-06-09 MED ORDER — POTASSIUM CHLORIDE 10 MEQ/100ML IV SOLN
10.0000 meq | INTRAVENOUS | Status: AC
  Administered 2017-06-09 (×2): 10 meq via INTRAVENOUS
  Filled 2017-06-09 (×2): qty 100

## 2017-06-09 MED ORDER — FLUTICASONE PROPIONATE 50 MCG/ACT NA SUSP
2.0000 | Freq: Every day | NASAL | Status: DC
  Administered 2017-06-09 – 2017-06-10 (×2): 2 via NASAL
  Filled 2017-06-09: qty 16

## 2017-06-09 MED ORDER — NOREPINEPHRINE BITARTRATE 1 MG/ML IV SOLN
0.0000 ug/min | INTRAVENOUS | Status: DC
  Administered 2017-06-09: 2 ug/min via INTRAVENOUS
  Administered 2017-06-09: 12 ug/min via INTRAVENOUS
  Filled 2017-06-09 (×4): qty 4

## 2017-06-09 MED ORDER — CEFTRIAXONE SODIUM IN DEXTROSE 20 MG/ML IV SOLN
1.0000 g | Freq: Once | INTRAVENOUS | Status: AC
  Administered 2017-06-09: 1 g via INTRAVENOUS
  Filled 2017-06-09: qty 50

## 2017-06-09 MED ORDER — MAGNESIUM SULFATE 2 GM/50ML IV SOLN
2.0000 g | Freq: Once | INTRAVENOUS | Status: AC
  Administered 2017-06-09: 2 g via INTRAVENOUS
  Filled 2017-06-09: qty 50

## 2017-06-09 MED ORDER — METRONIDAZOLE IN NACL 5-0.79 MG/ML-% IV SOLN
500.0000 mg | Freq: Three times a day (TID) | INTRAVENOUS | Status: DC
  Administered 2017-06-09 – 2017-06-10 (×4): 500 mg via INTRAVENOUS
  Filled 2017-06-09 (×6): qty 100

## 2017-06-09 MED ORDER — DEXTROSE 5 % IV SOLN: 2.0000 g | Freq: Once | INTRAVENOUS | Status: DC

## 2017-06-09 MED ORDER — SODIUM CHLORIDE 0.9 % IV SOLN: 1.0000 g | Freq: Once | INTRAVENOUS | Status: DC

## 2017-06-09 MED ORDER — MORPHINE SULFATE (PF) 2 MG/ML IV SOLN
1.0000 mg | Freq: Once | INTRAVENOUS | Status: AC
  Administered 2017-06-09: 1 mg via INTRAVENOUS

## 2017-06-09 MED ORDER — BISACODYL 5 MG PO TBEC
5.0000 mg | DELAYED_RELEASE_TABLET | Freq: Every day | ORAL | Status: DC | PRN
  Administered 2017-06-09: 5 mg via ORAL
  Filled 2017-06-09: qty 1

## 2017-06-09 MED ORDER — MAGNESIUM CITRATE PO SOLN
1.0000 | Freq: Once | ORAL | Status: DC | PRN
  Filled 2017-06-09: qty 296

## 2017-06-09 MED ORDER — ACETAMINOPHEN 650 MG RE SUPP: 650.0000 mg | Freq: Four times a day (QID) | RECTAL | Status: DC | PRN

## 2017-06-09 MED ORDER — TIZANIDINE HCL 2 MG PO TABS
2.0000 mg | ORAL_TABLET | Freq: Two times a day (BID) | ORAL | Status: DC
  Administered 2017-06-09 – 2017-06-17 (×17): 2 mg via ORAL
  Filled 2017-06-09 (×18): qty 1

## 2017-06-09 MED ORDER — HYDROCODONE-ACETAMINOPHEN 5-325 MG PO TABS
1.0000 | ORAL_TABLET | ORAL | Status: DC | PRN
  Administered 2017-06-09: 2 via ORAL
  Filled 2017-06-09: qty 2

## 2017-06-09 MED ORDER — ALBUTEROL SULFATE (2.5 MG/3ML) 0.083% IN NEBU: 2.5000 mg | INHALATION_SOLUTION | Freq: Four times a day (QID) | RESPIRATORY_TRACT | Status: DC | PRN

## 2017-06-09 MED ORDER — MORPHINE SULFATE (PF) 2 MG/ML IV SOLN
INTRAVENOUS | Status: AC
  Administered 2017-06-09: 2 mg via INTRAVENOUS
  Filled 2017-06-09: qty 1

## 2017-06-09 MED ORDER — DOCUSATE SODIUM 100 MG PO CAPS: 100.0000 mg | ORAL_CAPSULE | Freq: Two times a day (BID) | ORAL | Status: DC

## 2017-06-09 MED ORDER — POTASSIUM PHOSPHATES 15 MMOLE/5ML IV SOLN
30.0000 mmol | Freq: Once | INTRAVENOUS | Status: AC
  Administered 2017-06-09: 30 mmol via INTRAVENOUS
  Filled 2017-06-09: qty 10

## 2017-06-09 MED ORDER — ONDANSETRON HCL 4 MG/2ML IJ SOLN
4.0000 mg | Freq: Four times a day (QID) | INTRAMUSCULAR | Status: DC | PRN
  Administered 2017-06-09 – 2017-06-14 (×4): 4 mg via INTRAVENOUS
  Filled 2017-06-09 (×4): qty 2

## 2017-06-09 NOTE — Progress Notes (Signed)
PT Cancellation Note  Patient Details Name: Sophia Nelson MRN: [id number] DOB: 02-13-1946   Cancelled Treatment:    Reason Eval/Treat Not Completed: Patient not medically ready Pt with consistently low BP, most recently 85/60 as well as Ca+ of 5.2 and K+ 2.7.  Will hold PT today and assess for appropriateness tomorrow.  Kreg Shropshire, DPT 06/09/2017, 1:03 PM

## 2017-06-09 NOTE — Progress Notes (Signed)
Patient admitted w/ urosepsis and ordered lovenox 30 mg subq daily for VTE treatment. Patient's CrCl 15.8 ml/min  Will switch lovenox to subq heparin, MD notified and agrees w/ plan.  Tobie Lords, PharmD, BCPS Clinical Pharmacist 06/09/2017

## 2017-06-09 NOTE — Progress Notes (Signed)
Pharmacy Antibiotic Note  Sophia Nelson is a 71 y.o. female admitted on 06/08/2017 with UTI.  Pharmacy has been consulted for ceftriaxone dosing.  Plan: Patient received ceftriaxone 1g IV daily will give another ceftriaxone 1g IV for a total of 2g   Will start ceftriaxone 2g IV daily.  Height: 5' 1.5" (156.2 cm) Weight: 137 lb 9.1 oz (62.4 kg) IBW/kg (Calculated) : 48.95  Temp (24hrs), Avg:95.4 F (35.2 C), Min:94.7 F (34.8 C), Max:96 F (35.6 C)   Recent Labs Lab 06/08/17 2325  WBC 3.7  CREATININE 2.88*  LATICACIDVEN 1.3    Estimated Creatinine Clearance: 15.8 mL/min (A) (by C-G formula based on SCr of 2.88 mg/dL (H)).    Allergies  Allergen Reactions  . Other     Onions(that grow in the yard-pt can eat onions without problems) and dust mite Sneezing, cough, runny nose    Thank you for allowing pharmacy to be a part of this patient's care.  Tobie Lords, PharmD, BCPS Clinical Pharmacist 06/09/2017

## 2017-06-09 NOTE — ED Notes (Signed)
Eliezer Lofts, RN aware of bed assigned

## 2017-06-09 NOTE — ED Notes (Signed)
Attempted to call report. Primary RN unavailable at this time. Was informed Primary RN would return call.

## 2017-06-09 NOTE — Progress Notes (Signed)
MEDICATION RELATED CONSULT NOTE - INITIAL   Pharmacy Consult for electrolyte management Indication: hypokalemia, hypocalcemia, hypophosphatemia   Allergies  Allergen Reactions  . Other     Onions(that grow in the yard-pt can eat onions without problems) and dust mite Sneezing, cough, runny nose    Patient Measurements: Height: 5' 1.5" (156.2 cm) Weight: 137 lb 9.1 oz (62.4 kg) IBW/kg (Calculated) : 48.95  Vital Signs: Temp: 97.6 F (36.4 C) (10/14 0800) Temp Source: Oral (10/14 0800) BP: 85/60 (10/14 1100) Pulse Rate: 90 (10/14 1100) Intake/Output from previous day: 10/13 0701 - 10/14 0700 In: 272.3 [I.V.:272.3] Out: 250 [Urine:250] Intake/Output from this shift: Total I/O In: 226 [P.O.:120; I.V.:6; IV Piggyback:100] Out: -   Labs: Lab Results  Component Value Date   CREATININE 1.59 (H) 06/09/2017   BUN 16 06/09/2017   NA 134 (L) 06/09/2017   K 3.0 (L) 06/09/2017   CL 106 06/09/2017   CO2 22 06/09/2017     Recent Labs  06/08/17 2325 06/09/17 1441  WBC 3.7 2.7*  HGB 8.3* 8.4*  HCT 24.8* 25.2*  PLT 249 229  CREATININE 2.88* 1.59*  MG 1.4* 1.8  PHOS  --  <1.0*  ALBUMIN  --  1.9*  PROT  --  5.0*  AST  --  73*  ALT  --  38  ALKPHOS  --  147*  BILITOT  --  0.9   Estimated Creatinine Clearance: 28.7 mL/min (A) (by C-G formula based on SCr of 1.59 mg/dL (H)).   Microbiology: Recent Results (from the past 720 hour(s))  MRSA PCR Screening     Status: None   Collection Time: 06/09/17 11:50 AM  Result Value Ref Range Status   MRSA by PCR NEGATIVE NEGATIVE Final    Comment:        The GeneXpert MRSA Assay (FDA approved for NASAL specimens only), is one component of a comprehensive MRSA colonization surveillance program. It is not intended to diagnose MRSA infection nor to guide or monitor treatment for MRSA infections.     Medical History: Past Medical History:  Diagnosis Date  . Abdominal distention   . AKI (acute kidney injury) ([place])  02/24/2017  . Anemia   . Arthritis   . Asthma   . Back pain, chronic 03/15/2017  . Breast cancer ([place]) 2009   RT MASTECTOMY, DCIS  . 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 Cancer of left breast ([place]) 08/09/2015   T4 N1 M1 tumor, INVASIVE LOBULAR CARCINOMA.  . Carcinoma of overlapping sites of left breast in female, estrogen receptor positive ([place]) 03/22/2016  . COPD (chronic obstructive pulmonary disease) ([place])   . Diabetes ([place]) 02/24/2017  . Diabetes mellitus without complication ([place])   . Duodenal obstruction   . Encounter for nasogastric (NG) tube placement   . Gastric outlet obstruction 02/24/2017  . GERD (gastroesophageal reflux disease)   . Headache    h/o migraines as a child  . Hypertension   . Neck pain 04/19/2016  . Pancreatitis, acute 03/04/2017  . Pneumonia 2015  . Recurrent low back pain 03/15/2017  . Shortness of breath dyspnea    with exertion    Medications:  Prescriptions Prior to Admission  Medication Sig Dispense Refill Last Dose  . Abemaciclib 150 MG TABS Take 150 mg by mouth 2 (two) times daily. 60 tablet 6 Taking  . ADVAIR DISKUS 500-50 MCG/DOSE AEPB Inhale 1 puff into the  lungs 2 (two) times daily.    Taking  . albuterol (PROVENTIL HFA;VENTOLIN HFA) 108 (90 Base) MCG/ACT inhaler Inhale 2 puffs into the lungs every 6 (six) hours as needed for wheezing or shortness of breath.   Taking  . aspirin 81 MG tablet Take 81 mg by mouth daily.   Taking  . atorvastatin (LIPITOR) 10 MG tablet Take 10 mg by mouth daily.   Taking  . docusate sodium (COLACE) 100 MG capsule Take 100 mg by mouth 2 (two) times daily.   Taking  . ergocalciferol (VITAMIN D2) 50000 units capsule Take 1 capsule (50,000 Units total) by mouth once a week. 12 capsule 1   . feeding supplement, ENSURE ENLIVE, (ENSURE ENLIVE) LIQD Take 237 mLs by mouth 3 (three) times daily between meals. 237 mL 12 Taking  .  fluticasone (FLONASE) 50 MCG/ACT nasal spray Place 2 sprays into both nostrils daily.   Taking  . GLIPIZIDE XL 2.5 MG 24 hr tablet Take 2.5 mg by mouth daily with breakfast.    Taking  . lidocaine-prilocaine (EMLA) cream Apply 1 application topically as needed.     [name] lisinopril (PRINIVIL,ZESTRIL) 40 MG tablet Take 40 mg by mouth daily.    Not Taking  . loperamide (IMODIUM) 2 MG capsule One pill after each loose stool; maximum upto 8 pills a day. 40 capsule 0 Taking  . loratadine (CLARITIN) 10 MG tablet Take 10 mg by mouth daily.   Taking  . metoCLOPramide (REGLAN) 10 MG tablet Take 1 tablet (10 mg total) by mouth 4 (four) times daily. 120 tablet 3   . montelukast (SINGULAIR) 10 MG tablet Take 10 mg by mouth at bedtime.   Taking  . omeprazole (PRILOSEC) 20 MG capsule Take 20 mg by mouth every morning.    Taking  . oxyCODONE-acetaminophen (PERCOCET/ROXICET) 5-325 MG tablet Take 1 tablet by mouth every 8 (eight) hours as needed for severe pain. 90 tablet 0   . OXYGEN Inhale 2 L into the lungs continuous.    Taking  . SPIRIVA HANDIHALER 18 MCG inhalation capsule Place 18 mcg into inhaler and inhale every morning.    Taking  . tiZANidine (ZANAFLEX) 2 MG tablet Take 1 tablet by mouth 2 (two) times daily.   Taking   Scheduled:  . abemaciclib  150 mg Oral BID  . aspirin EC  81 mg Oral Daily  . atorvastatin  10 mg Oral Daily  . docusate sodium  100 mg Oral BID  . feeding supplement (ENSURE ENLIVE)  237 mL Oral TID BM  . fluticasone  2 spray Each Nare Daily  . heparin subcutaneous  5,000 Units Subcutaneous Q8H  . insulin aspart  0-9 Units Subcutaneous Q4H  . loratadine  10 mg Oral Daily  . mometasone-formoterol  2 puff Inhalation BID  . montelukast  10 mg Oral QHS  . pantoprazole  40 mg Oral Daily  . sodium chloride flush  3 mL Intravenous Q12H  . tiotropium  18 mcg Inhalation [id number]  . tiZANidine  2 mg Oral BID   Infusions:  . 0.9 % NaCl with KCl 20 mEq / L 75 mL/hr at 06/09/17 0700  .  calcium chloride    . cefTRIAXone (ROCEPHIN)  IV    . magnesium sulfate 1 - 4 g bolus IVPB    . metronidazole Stopped (06/09/17 1026)  . norepinephrine (LEVOPHED) Adult infusion    . potassium chloride    . potassium PHOSPHATE IVPB (mmol)     PRN: acetaminophen **OR**  acetaminophen, albuterol, bisacodyl, guaiFENesin, HYDROcodone-acetaminophen, ipratropium, loperamide, magnesium citrate, ondansetron **OR** ondansetron (ZOFRAN) IV, oxyCODONE-acetaminophen, senna-docusate Anti-infectives    Start     Dose/Rate Route Frequency Ordered Stop   06/09/17 2000  cefTRIAXone (ROCEPHIN) 2 g in dextrose 5 % 50 mL IVPB     2 g 100 mL/hr over 30 Minutes Intravenous Every 24 hours 06/09/17 0407     06/09/17 0900  metroNIDAZOLE (FLAGYL) IVPB 500 mg     500 mg 100 mL/hr over 60 Minutes Intravenous Every 8 hours 06/09/17 0838     06/09/17 0415  cefTRIAXone (ROCEPHIN) 1 g in dextrose 5 % 50 mL IVPB     1 g 100 mL/hr over 30 Minutes Intravenous  Once 06/09/17 0407 06/09/17 0530   06/09/17 0345  cefTRIAXone (ROCEPHIN) 2 g in dextrose 5 % 50 mL IVPB  Status:  Discontinued     2 g 100 mL/hr over 30 Minutes Intravenous  Once 06/09/17 0331 06/09/17 0351   06/09/17 0015  cefTRIAXone (ROCEPHIN) 1 g in dextrose 5 % 50 mL IVPB - Premix     1 g 100 mL/hr over 30 Minutes Intravenous  Once 06/09/17 0012 06/09/17 0053     Goal of Therapy:  Electrolytes WNL  Plan:  Calcium 2gm already ordered as well as potassium phos 71mmol by provider. Will add KCl 79meq iv x 3, will recheck K this evening and BMP tomorrow with AM Labs   Donna Christen Andrey Hoobler 06/09/2017,5:49 PM

## 2017-06-09 NOTE — ED Provider Notes (Signed)
[facility] Emergency Department Provider Note   ____________________________________________   First MD Initiated Contact with Patient 06/08/17 2318     (approximate)  I have reviewed the triage vital signs and the nursing notes.   HISTORY  Chief Complaint Loss of Consciousness (unwitnessed - found on floor) and Hypoglycemia    HPI Sophia Nelson is a 71 y.o. female who comes into the hospital today after being found unresponsive on the floor of her home. The patient states that she could not raise her head but she does not remember what happened. She states that she know she is in the hospital and knows it is a Saturday. She also knows we are in the month of October. The patient states that she feels cold but has no other complaints. Per EMS the patient was found unconscious by family on the floor of her home. When EMS evaluated the patient her blood sugar was in the 40s. They were able to get the patient to EES and which and drink some juice which increased her blood sugars to the 70s but when she arrived here she was back in the 40s. The patient has no chest pain no shortness of breath no blurry vision no nausea no vomiting. She again does not recall what occurred.The patient did have some blurred vision and headache yesterday.   Past Medical History:  Diagnosis Date  . Abdominal distention   . AKI (acute kidney injury) ([place]) 02/24/2017  . Anemia   . Arthritis   . Asthma   . Back pain, chronic 03/15/2017  . Breast cancer ([place]) 2009   RT MASTECTOMY, DCIS  . Cancer of left breast ([place]) 08/09/2015   T4 N1 M1 tumor, INVASIVE LOBULAR CARCINOMA.  . Carcinoma of overlapping sites of left breast in female, estrogen receptor positive ([place]) 03/22/2016  . COPD (chronic obstructive pulmonary disease) ([place])   . Diabetes ([place]) 02/24/2017  . Diabetes mellitus without complication ([place])   . Duodenal obstruction   . Encounter for nasogastric (NG) tube placement   .  Gastric outlet obstruction 02/24/2017  . GERD (gastroesophageal reflux disease)   . Headache    h/o migraines as a child  . Hypertension   . Neck pain 04/19/2016  . Pancreatitis, acute 03/04/2017  . Pneumonia 2015  . Recurrent low back pain 03/15/2017  . Shortness of breath dyspnea    with exertion    Patient Active Problem List   Diagnosis Date Noted  . Sepsis secondary to UTI ([place]) 06/09/2017  . Hypocalcemia 05/30/2017  . 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 Acute back pain 03/28/2017  . Back pain, chronic 03/15/2017  . Chronic lumbar pain 03/15/2017  . Recurrent low back pain 03/15/2017  . Duodenal obstruction   . Pancreatitis, acute 03/04/2017  . Abdominal distention   . Encounter for nasogastric (NG) tube placement   . Gastric outlet obstruction 02/24/2017  . AKI (acute kidney injury) ([place]) 02/24/2017  . GERD (gastroesophageal reflux disease) 02/24/2017  . Diabetes ([place]) 02/24/2017  . COPD (chronic obstructive pulmonary disease) ([place]) 02/24/2017  . Neck pain 04/19/2016  . Carcinoma of overlapping sites of left breast in female, estrogen receptor positive ([place]) 03/22/2016    Past Surgical History:  Procedure Laterality Date  . ABDOMINAL HYSTERECTOMY    . BREAST SURGERY Right 2015   mastectomy  . ESOPHAGOGASTRODUODENOSCOPY N/A 02/25/2017   Procedure: ESOPHAGOGASTRODUODENOSCOPY (EGD);  Surgeon: Jonathon Bellows, MD;  Location: [facility] ENDOSCOPY;  Service: Endoscopy;  Laterality: N/A;  .  ESOPHAGOGASTRODUODENOSCOPY (EGD) WITH PROPOFOL N/A 03/06/2017   Procedure: ESOPHAGOGASTRODUODENOSCOPY (EGD) WITH PROPOFOL;  Surgeon: [name], MD;  [facility] ENDOSCOPY;  Service: Endoscopy;  Laterality: N/A;  . GASTROJEJUNOSTOMY N/A 03/08/2017   Procedure: Roux-en-Y gastrojejunostomy Bypass of Gastric Outlet Obstruction, small bowel resection;  Surgeon: Vickie Epley, MD;  Location: [facility] ORS;  Service: General;  Laterality: N/A;  . SIMPLE MASTECTOMY WITH AXILLARY SENTINEL NODE BIOPSY Left  02/29/2016   Procedure: SIMPLE MASTECTOMY;  Surgeon: Christene Lye, MD;  Location: [facility] ORS;  Service: General;  Laterality: Left;  . TUBAL LIGATION      Prior to Admission medications   Medication Sig Start Date End Date Taking? Authorizing Provider  Abemaciclib 150 MG TABS Take 150 mg by mouth 2 (two) times daily. 05/02/17  Yes [name], MD  ADVAIR DISKUS 500-50 MCG/DOSE AEPB Inhale 1 puff into the lungs 2 (two) times daily.  05/04/15  Yes [provider]  albuterol (PROVENTIL HFA;VENTOLIN HFA) 108 (90 Base) MCG/ACT inhaler Inhale 2 puffs into the lungs every 6 (six) hours as needed for wheezing or shortness of breath.   Yes [provider]  aspirin 81 MG tablet Take 81 mg by mouth daily.   Yes [provider]  atorvastatin (LIPITOR) 10 MG tablet Take 10 mg by mouth daily.   Yes [provider]  docusate sodium (COLACE) 100 MG capsule Take 100 mg by mouth 2 (two) times daily.   Yes [provider]  ergocalciferol (VITAMIN D2) 50000 units capsule Take 1 capsule (50,000 Units total) by mouth once a week. 05/30/17  Yes [name], MD  feeding supplement, ENSURE ENLIVE, (ENSURE ENLIVE) LIQD Take 237 mLs by mouth 3 (three) times daily between meals. 03/13/17  Yes Fritzi Mandes, MD  fluticasone (FLONASE) 50 MCG/ACT nasal spray Place 2 sprays into both nostrils daily.   Yes [provider]  GLIPIZIDE XL 2.5 MG 24 hr tablet Take 2.5 mg by mouth daily with breakfast.  06/23/15  Yes [provider]  lidocaine-prilocaine (EMLA) cream Apply 1 application topically as needed. 04/26/17  Yes [provider]  lisinopril (PRINIVIL,ZESTRIL) 40 MG tablet Take 40 mg by mouth daily.  01/03/17  Yes [provider]  loperamide (IMODIUM) 2 MG capsule One pill after each loose stool; maximum upto 8 pills a day. 05/02/17  Yes [name], MD  loratadine (CLARITIN) 10 MG tablet Take 10 mg by mouth daily.   Yes  [provider]  metoCLOPramide (REGLAN) 10 MG tablet Take 1 tablet (10 mg total) by mouth 4 (four) times daily. 05/30/17  Yes [name], MD  montelukast (SINGULAIR) 10 MG tablet Take 10 mg by mouth at bedtime.   Yes [provider]  omeprazole (PRILOSEC) 20 MG capsule Take 20 mg by mouth every morning.  06/23/15  Yes [provider]  oxyCODONE-acetaminophen (PERCOCET/ROXICET) 5-325 MG tablet Take 1 tablet by mouth every 8 (eight) hours as needed for severe pain. 06/01/17  Yes [name], MD  OXYGEN Inhale 2 L into the lungs continuous.    Yes [provider]  SPIRIVA HANDIHALER 18 MCG inhalation capsule Place 18 mcg into inhaler and inhale every morning.  05/04/15  Yes [provider]  tiZANidine (ZANAFLEX) 2 MG tablet Take 1 tablet by mouth 2 (two) times daily. 04/12/17  Yes [provider]    Allergies Other  Family History  Problem Relation Age of Onset  . Lung cancer Father   . Breast  cancer Neg Hx     Social History Social History  Substance Use Topics  . Smoking status: Current Some Day Smoker    Packs/day: 0.25    Years: 15.00    Types: Cigarettes    Last attempt to quit: 02/25/2016  . Smokeless tobacco: Never Used     Comment: currently as of 02-21-16 pt states she is only smoking 2-3 cigarettes per day  . 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 Cardiovascular: Denies chest pain. Respiratory: Denies shortness of breath. Gastrointestinal: No abdominal pain.  No nausea, no vomiting.  No diarrhea.  No constipation. Genitourinary: Negative for dysuria. Musculoskeletal: Negative for back pain. Skin: Negative for rash. Neurological: Syncope, headache   ____________________________________________   PHYSICAL  EXAM:  VITAL SIGNS: ED Triage Vitals  Enc Vitals Group     BP 06/08/17 2300 (!) 71/42     Pulse Rate 06/08/17 2306 86     Resp 06/08/17 2306 13     Temp 06/08/17 2344 (!) 94.7 F (34.8 C)     Temp Source 06/08/17 2344 Rectal     SpO2 06/08/17 2255 94 %     Weight 06/08/17 2257 128 lb (58.1 kg)     Height 06/08/17 2257 5' 1.5" (1.562 m)     Head Circumference --      Peak Flow --      Pain Score --      Pain Loc --      Pain Edu? --      Excl. in [place]? --     Constitutional: Alert and oriented To self and place and partially the date in no acute distress. Eyes: Conjunctivae are normal. PERRL. EOMI. Head: Atraumatic. Nose: No congestion/rhinnorhea. Mouth/Throat: Mucous membranes are moist.  Oropharynx non-erythematous. Neck: No cervical spine tenderness to palpation. Cardiovascular: Normal rate, regular rhythm. Grossly normal heart sounds.  Good peripheral circulation. Respiratory: Normal respiratory effort.  No retractions. Lungs CTAB. Gastrointestinal: Soft and nontender. Mild distention. Positive bowel sounds Musculoskeletal: No lower extremity tenderness nor edema.   Neurologic:  Normal speech and language. Cranial nerves II through XII are grossly intact with no focal motor or neuro deficits Skin:  Skin is warm, dry and intact.  Psychiatric: Mood and affect are normal.   ____________________________________________   LABS (all labs ordered are listed, but only abnormal results are displayed)  Labs Reviewed  BASIC METABOLIC PANEL - Abnormal; Notable for the following:       Result Value   Sodium 128 (*)    Potassium 2.7 (*)    Chloride 92 (*)    Glucose, Bld 297 (*)    BUN 24 (*)    Creatinine, Ser 2.88 (*)    Calcium 5.2 (*)    GFR calc non Af Amer 16 (*)    GFR calc Af Amer 18 (*)    All other components within normal limits  CBC - Abnormal; Notable for the following:    RBC 3.01 (*)    Hemoglobin 8.3 (*)    HCT 24.8 (*)    RDW 22.0 (*)    All other  components within normal limits  URINALYSIS, COMPLETE (UACMP) WITH MICROSCOPIC - Abnormal; Notable for the following:    Color, Urine AMBER (*)    APPearance CLOUDY (*)  Glucose, UA 50 (*)    Hgb urine dipstick MODERATE (*)    Ketones, ur 5 (*)    Protein, ur 100 (*)    Bacteria, UA FEW (*)    All other components within normal limits  CK - Abnormal; Notable for the following:    Total CK 502 (*)    All other components within normal limits  GLUCOSE, CAPILLARY - Abnormal; Notable for the following:    Glucose-Capillary 46 (*)    All other components within normal limits  GLUCOSE, CAPILLARY - Abnormal; Notable for the following:    Glucose-Capillary 191 (*)    All other components within normal limits  MAGNESIUM - Abnormal; Notable for the following:    Magnesium 1.4 (*)    All other components within normal limits  URINE CULTURE  TROPONIN I  LACTIC ACID, PLASMA  CBG MONITORING, ED   ____________________________________________  EKG  ED ECG REPORT I, Loney Hering, the attending physician, personally viewed and interpreted this ECG.   Date: 06/08/2017  EKG Time: 2357  Rate: 88  Rhythm: normal sinus rhythm  Axis: normal  Intervals:prolonged qtc  ST&T Change: none  ____________________________________________  RADIOLOGY  Ct Head Wo Contrast  Result Date: 06/09/2017 CLINICAL DATA:  Initial evaluation for acute trauma, fall. EXAM: CT HEAD WITHOUT CONTRAST TECHNIQUE: Contiguous axial images were obtained from the base of the skull through the vertex without intravenous contrast. COMPARISON:  Prior CT from 11/02/2016. FINDINGS: Brain: Cerebral volume within normal limits for patient age. No evidence for acute intracranial hemorrhage. No findings to suggest acute large vessel territory infarct. No mass lesion, midline shift, or mass effect. Ventricles are normal in size without evidence for hydrocephalus. No extra-axial fluid collection identified. Vascular: No  hyperdense vessel identified. Skull: Scalp soft tissues demonstrate no acute abnormality.Calvarium intact. Focal exostosis extending from the left occipital calvarium again noted. Sinuses/Orbits: Globes and orbital soft tissues are within normal limits. Visualized paranasal sinuses are clear. No mastoid effusion. IMPRESSION: Negative head CT.  No acute intracranial abnormality identified. Electronically Signed   By: Jeannine Boga M.D.   On: 06/09/2017 00:35    ____________________________________________   PROCEDURES  Procedure(s) performed: None  Procedures  Critical Care performed: Yes, see critical care note(s)   CRITICAL CARE Performed by: Charlesetta Ivory P   Total critical care time: 30 minutes  Critical care time was exclusive of separately billable procedures and treating other patients.  Critical care was necessary to treat or prevent imminent or life-threatening deterioration.  Critical care was time spent personally by me on the following activities: development of treatment plan with patient and/or surrogate as well as nursing, discussions with consultants, evaluation of patient's response to treatment, examination of patient, obtaining history from patient or surrogate, ordering and performing treatments and interventions, ordering and review of laboratory studies, ordering and review of radiographic studies, pulse oximetry and re-evaluation of patient's condition.  ____________________________________________   INITIAL IMPRESSION / ASSESSMENT AND PLAN / ED COURSE  As part of my medical decision making, I reviewed the following data within the electronic MEDICAL RECORD NUMBER Notes from prior ED visits   This is a 71 year old female who comes into the hospital today after being found unresponsive at home. Per EMS the patient's blood sugar was in the 40s.  My differential diagnosis includes hypoglycemia, dehydration, vasovagal syncope, infection, electrolyte  abnormalities.  I did check some blood work on the patient and there were many significant abnormalities. The patient did receive a  dose of D50 after her blood sugar of 46 in the emergency department. It was found though that the patient had low sodium which was 128, hypokalemia at 2.7, hypocalcemia at 5.2 as well as acute renal failure with a creatinine of 2.88. The patient also has cloudy appearing urine with too numerous to count white blood cells. The patient was also found to be hypothermic. We did place a bear hugger around the patient and I did give her liter of normal saline. She also received a dose of activity see him for prolonged QTC, ceftriaxone for her infection as well as potassium. We will also give the patient some calcium gluconate when she's received her other medications. The patient will be admitted to the hospitalist service for further evaluation and treatment of her symptoms. The patient did receive a CT scan to evaluate her syncope as well as her headache.     After some time in the emergency department and while she was receiving her magnesium the patient's blood pressure decreased again. I then had the nurse give the patient 2 more liters of normal saline given her creatinine. The nurse contacted the admitting physician and the patient's disposition was changed to a stepdown bed. After the completion of the liters of normal saline the patient's blood pressure was still low so I did order a norepinephrine drip for the patient. I did plan to place a central line but the patient does have a bed in the intensive care unit so she will be taken up to the intensive care unit to have a central line placed and to continue her vasopressors.  ____________________________________________   FINAL CLINICAL IMPRESSION(S) / ED DIAGNOSES  Final diagnoses:  Syncope, unspecified syncope type  Hypoglycemia  Hypocalcemia  Acute renal failure, unspecified acute renal failure type ([place])   Hypokalemia  Urinary tract infection without hematuria, site unspecified  Hypothermia, initial encounter      NEW MEDICATIONS STARTED DURING THIS VISIT:  New Prescriptions   No medications on file     Note:  This document was prepared using Dragon voice recognition software and may include unintentional dictation errors.    Loney Hering, MD 06/09/17 0230

## 2017-06-09 NOTE — ED Notes (Signed)
Informed MD Pyreddy of patient's BP. MD ordered to start the levophed as soon as it arrives from pharmacy

## 2017-06-09 NOTE — ED Notes (Signed)
Updated MD Huglemeyer on patient's vital signs

## 2017-06-09 NOTE — ED Notes (Signed)
Informed MD Dahlia Client of patients temperature

## 2017-06-09 NOTE — Consult Note (Signed)
PULMONARY / CRITICAL CARE MEDICINE   Name: Sophia Nelson MRN: [id number] DOB: [birth year]    ADMISSION DATE:  06/08/2017 CONSULTATION DATE:  06/09/17  REFERRING MD:  Hugelmelyer  REASON: Hypoglycemia and hypotension  CHIEF COMPLAINT: found down unresponsive  HISTORY OF PRESENT ILLNESS:   This is a 71 year old African-American female with a history of bilateral breast cancer, status post bilateral mastectomy,bone and abdominal mass metastasis, and an extensive medical history as indicated below who presented to the ED via EMS after being found unresponsive. patient was lying on the floor when EMS arrived. her blood glucose was 40, and EMS gave her a glucose drink and ham sandwich with improvement in her blood glucose to 70 mg/dL. At the ED, patient was hypotensive hypothermic and was given 2 L of fluids with some improvement in her blood pressure. Patient takes glipizide for type 2 diabetes. She reports a moderate appetite and reports eating prior to taking the last dose. She recent had gastric bypass for an abdominal tumor. She is currently awake and off has no complaints. Indicates that she wants to go home.her ED workup showed multiple electrolyte imbalances.high blood glucose level is currently 297. She denies chest pain, palpitations, nausea, vomiting but reports mild left upper quadrant abdominal pain that has persisted post surgery  PAST MEDICAL HISTORY :  She  has a past medical history of Abdominal distention; AKI (acute kidney injury) ([place]) (02/24/2017); Anemia; Arthritis; Asthma; Back pain, chronic (03/15/2017); Breast cancer ([place]) (2009); Cancer of left breast ([place]) (08/09/2015); Carcinoma of overlapping sites of left breast in female, estrogen receptor positive ([place]) (03/22/2016); COPD (chronic obstructive pulmonary disease) ([place]); Diabetes ([place]) (02/24/2017); Diabetes mellitus without complication ([place]); Duodenal obstruction; Encounter for nasogastric (NG) tube placement; Gastric outlet  obstruction (02/24/2017); GERD (gastroesophageal reflux disease); Headache; Hypertension; Neck pain (04/19/2016); Pancreatitis, acute (03/04/2017); Pneumonia (2015); Recurrent low back pain (03/15/2017); and Shortness of breath dyspnea.  PAST SURGICAL HISTORY: She  has a past surgical history that includes Tubal ligation; Breast surgery (Right, 2015); Abdominal hysterectomy; Simple mastectomy with axillary sentinel node biopsy (Left, 02/29/2016); Esophagogastroduodenoscopy (N/A, 02/25/2017); Esophagogastroduodenoscopy (egd) with propofol (N/A, 03/06/2017); and Gastrojejunostomy (N/A, 03/08/2017).  Allergies  Allergen Reactions  . Other     Onions(that grow in the yard-pt can eat onions without problems) and dust mite Sneezing, cough, runny nose    No current facility-administered medications on file prior to encounter.    Current Outpatient Prescriptions on File Prior to Encounter  Medication Sig  . Abemaciclib 150 MG TABS Take 150 mg by mouth 2 (two) times daily.  [name] ADVAIR DISKUS 500-50 MCG/DOSE AEPB Inhale 1 puff into the lungs 2 (two) times daily.   [name] albuterol (PROVENTIL HFA;VENTOLIN HFA) 108 (90 Base) MCG/ACT inhaler Inhale 2 puffs into the lungs every 6 (six) hours as needed for wheezing or shortness of breath.  [name] aspirin 81 MG tablet Take 81 mg by mouth daily.  [name] atorvastatin (LIPITOR) 10 MG tablet Take 10 mg by mouth daily.  [name] docusate sodium (COLACE) 100 MG capsule Take 100 mg by mouth 2 (two) times daily.  . ergocalciferol (VITAMIN D2) 50000 units capsule Take 1 capsule (50,000 Units total) by mouth once a week.  . feeding supplement, ENSURE ENLIVE, (ENSURE ENLIVE) LIQD Take 237 mLs by mouth 3 (three) times daily between meals.  . fluticasone (FLONASE) 50 MCG/ACT nasal spray Place 2 sprays into both nostrils daily.  [name] GLIPIZIDE XL 2.5 MG 24 hr tablet Take 2.5 mg by mouth daily with breakfast.   .  lisinopril (PRINIVIL,ZESTRIL) 40 MG tablet Take 40 mg by mouth daily.   [name] loperamide (IMODIUM) 2  MG capsule One pill after each loose stool; maximum upto 8 pills a day.  . loratadine (CLARITIN) 10 MG tablet Take 10 mg by mouth daily.  . metoCLOPramide (REGLAN) 10 MG tablet Take 1 tablet (10 mg total) by mouth 4 (four) times daily.  . montelukast (SINGULAIR) 10 MG tablet Take 10 mg by mouth at bedtime.  [name] omeprazole (PRILOSEC) 20 MG capsule Take 20 mg by mouth every morning.   [name] oxyCODONE-acetaminophen (PERCOCET/ROXICET) 5-325 MG tablet Take 1 tablet by mouth every 8 (eight) hours as needed for severe pain.  . OXYGEN Inhale 2 L into the lungs continuous.   [name] SPIRIVA HANDIHALER 18 MCG inhalation capsule Place 18 mcg into inhaler and inhale every morning.   [name] tiZANidine (ZANAFLEX) 2 MG tablet Take 1 tablet by mouth 2 (two) times daily.    FAMILY HISTORY:  Her indicated that the status of her father is unknown. She indicated that the status of her neg hx is unknown.    SOCIAL HISTORY: She  reports that she has been smoking Cigarettes.  She has a 3.75 pack-year smoking history. She has never used smokeless tobacco. She reports that she does not drink alcohol or use drugs.  REVIEW OF SYSTEMS:   Constitutional: Negative for fever and chills.  HENT: Negative for congestion and rhinorrhea.  Eyes: Negative for redness and visual disturbance.  Respiratory: Negative for shortness of breath and wheezing.  Cardiovascular: Negative for chest pain and palpitations.  Gastrointestinal: Negative  for nausea , vomiting but positive for abdominal painGenitourinary: Negative for dysuria and urgency.  Endocrine: Denies polyuria, polyphagia and heat intolerance Musculoskeletal: Negative for myalgias and arthralgias.  Skin: Negative for pallor and wound.  Neurological: Negative for dizziness and headaches   SUBJECTIVE:   VITAL SIGNS: BP 95/78   Pulse 93   Temp 99 F (37.2 C) (Core (Comment))   Resp (!) 21   Ht 5' 1.5" (1.562 m)   Wt 137 lb 9.1 oz (62.4 kg)   SpO2 100%   BMI 25.57 kg/m    HEMODYNAMICS:    VENTILATOR SETTINGS:    INTAKE / OUTPUT: I/O last 3 completed shifts: In: 84.8 [I.V.:84.8] Out: 250 [Urine:250]  PHYSICAL EXAMINATION: General:  NAD Neuro:  AAOX 4, no focal deficits HEENT: PERRLA, trachea midline, no JVD Cardiovascular:  RRR, S1/S2, no MRG Lungs: ctab Abdomen:  Non distended, well-healed surgical scars, normal bowel sounds in all 4 quadrants, palpation reveals diffuse tenderness and point tenderness in the right upper quadrant along the surgical incision line Musculoskeletal:  No deformities, positive range of motion Skin:  Warm and dry  LABS:  BMET  Recent Labs Lab 06/08/17 2325  NA 128*  K 2.7*  CL 92*  CO2 24  BUN 24*  CREATININE 2.88*  GLUCOSE 297*    Electrolytes  Recent Labs Lab 06/08/17 2325  CALCIUM 5.2*  MG 1.4*    CBC  Recent Labs Lab 06/08/17 2325  WBC 3.7  HGB 8.3*  HCT 24.8*  PLT 249    Coag's No results for input(s): APTT, INR in the last 168 hours.  Sepsis Markers  Recent Labs Lab 06/08/17 2325  LATICACIDVEN 1.3    ABG No results for input(s): PHART, PCO2ART, PO2ART in the last 168 hours.  Liver Enzymes No results for input(s): AST, ALT, ALKPHOS, BILITOT, ALBUMIN in the last 168 hours.  Cardiac Enzymes  Recent Labs Lab  06/08/17 2325  TROPONINI <0.03    Glucose  Recent Labs Lab 06/08/17 2300 06/08/17 2341 06/09/17 0441  GLUCAP 46* 191* 204*    Imaging Dg Abd 1 View  Result Date: 06/09/2017 CLINICAL DATA:  Acute onset of generalized abdominal pain. Initial encounter. EXAM: ABDOMEN - 1 VIEW COMPARISON:  Abdominal radiograph performed 03/07/2017 FINDINGS: The visualized bowel gas pattern is unremarkable. Scattered air filled loops of colon are seen; no abnormal dilatation of small bowel loops is seen to suggest small bowel obstruction. No free intra-abdominal air is identified, though evaluation for free air is limited on a single supine view. The visualized osseous  structures are within normal limits; the sacroiliac joints are unremarkable in appearance. The visualized lung bases are essentially clear. IMPRESSION: Unremarkable bowel gas pattern; no free intra-abdominal air seen. Electronically Signed   By: Garald Balding M.D.   On: 06/09/2017 05:31   Ct Head Wo Contrast  Result Date: 06/09/2017 CLINICAL DATA:  Initial evaluation for acute trauma, fall. EXAM: CT HEAD WITHOUT CONTRAST TECHNIQUE: Contiguous axial images were obtained from the base of the skull through the vertex without intravenous contrast. COMPARISON:  Prior CT from 11/02/2016. FINDINGS: Brain: Cerebral volume within normal limits for patient age. No evidence for acute intracranial hemorrhage. No findings to suggest acute large vessel territory infarct. No mass lesion, midline shift, or mass effect. Ventricles are normal in size without evidence for hydrocephalus. No extra-axial fluid collection identified. Vascular: No hyperdense vessel identified. Skull: Scalp soft tissues demonstrate no acute abnormality.Calvarium intact. Focal exostosis extending from the left occipital calvarium again noted. Sinuses/Orbits: Globes and orbital soft tissues are within normal limits. Visualized paranasal sinuses are clear. No mastoid effusion. IMPRESSION: Negative head CT.  No acute intracranial abnormality identified. Electronically Signed   By: Jeannine Boga M.D.   On: 06/09/2017 00:35    STUDIES:  none  CULTURES: Lab cultures 2. Urine culture  ANTIBIOTICS: Rocephin. 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 Antibiotics as above. Follow-up cultures Monitor and replace electrolytes IV fluids Blood glucose monitoring with sliding scale insulin coverage as needed Follow up with oncology as indicated GI and DVT prophylaxis Follow-up repeat labs  Patient is doing much better If she continues to improve clinically. She may be transferred out of the ICU to small in the  FAMILY  - Updates: family at bedside. Patient updated on current treatment plan  - Inter-disciplinary family meet or Palliative Care meeting due by:  day [place]. [facility] ANP-BC Pulmonary and Critical Care Medicine [facility] Pager [phone] or [phone] 06/09/2017, 8:21 AM

## 2017-06-09 NOTE — Progress Notes (Signed)
MEDICATION RELATED CONSULT NOTE - INITIAL   Pharmacy Consult for electrolyte management Indication: hypokalemia, hypocalcemia, hypophosphatemia   Allergies  Allergen Reactions  . Other     Onions(that grow in the yard-pt can eat onions without problems) and dust mite Sneezing, cough, runny nose    Patient Measurements: Height: 5' 1.5" (156.2 cm) Weight: 137 lb 9.1 oz (62.4 kg) IBW/kg (Calculated) : 48.95  Vital Signs: Temp: 99 F (37.2 C) (10/14 1400) Temp Source: Oral (10/14 1400) BP: 61/42 (10/14 1700) Pulse Rate: 96 (10/14 1700) Intake/Output from previous day: 10/13 0701 - 10/14 0700 In: 272.3 [I.V.:272.3] Out: 250 [Urine:250] Intake/Output from this shift: No intake/output data recorded.  Labs: Lab Results  Component Value Date   CREATININE 1.59 (H) 06/09/2017   BUN 16 06/09/2017   NA 134 (L) 06/09/2017   K 3.0 (L) 06/09/2017   CL 106 06/09/2017   CO2 22 06/09/2017     Recent Labs  06/08/17 2325 06/09/17 1441  WBC 3.7 2.7*  HGB 8.3* 8.4*  HCT 24.8* 25.2*  PLT 249 229  CREATININE 2.88* 1.59*  MG 1.4* 1.8  PHOS  --  <1.0*  ALBUMIN  --  1.9*  PROT  --  5.0*  AST  --  73*  ALT  --  38  ALKPHOS  --  147*  BILITOT  --  0.9   Estimated Creatinine Clearance: 28.7 mL/min (A) (by C-G formula based on SCr of 1.59 mg/dL (H)).   Microbiology: Recent Results (from the past 720 hour(s))  MRSA PCR Screening     Status: None   Collection Time: 06/09/17 11:50 AM  Result Value Ref Range Status   MRSA by PCR NEGATIVE NEGATIVE Final    Comment:        The GeneXpert MRSA Assay (FDA approved for NASAL specimens only), is one component of a comprehensive MRSA colonization surveillance program. It is not intended to diagnose MRSA infection nor to guide or monitor treatment for MRSA infections.     Medical History: Past Medical History:  Diagnosis Date  . Abdominal distention   . AKI (acute kidney injury) ([place]) 02/24/2017  . Anemia   . Arthritis   .  Asthma   . Back pain, chronic 03/15/2017  . Breast cancer ([place]) 2009   RT MASTECTOMY, DCIS  . Cancer of intra-abdominal ([place])    Metastatic breast cancer lobular carcinoma wth recurrence in the abdomen status post resection  . Cancer of left breast ([place]) 08/09/2015   T4 N1 M1 tumor, INVASIVE LOBULAR CARCINOMA.  . Carcinoma of overlapping sites of left breast in female, estrogen receptor positive ([place]) 03/22/2016  . COPD (chronic obstructive pulmonary disease) ([place])   . Diabetes ([place]) 02/24/2017  . Diabetes mellitus without complication ([place])   . Duodenal obstruction   . Encounter for nasogastric (NG) tube placement   . Gastric outlet obstruction 02/24/2017  . GERD (gastroesophageal reflux disease)   . Headache    h/o migraines as a child  . Hypertension   . Neck pain 04/19/2016  . Pancreatitis, acute 03/04/2017  . Pneumonia 2015  . Recurrent low back pain 03/15/2017  . Shortness of breath dyspnea    with exertion    Medications:  Prescriptions Prior to Admission  Medication Sig Dispense Refill Last Dose  . Abemaciclib 150 MG TABS Take 150 mg by mouth 2 (two) times daily. 60 tablet 6 Taking  . ADVAIR DISKUS 500-50 MCG/DOSE AEPB Inhale 1 puff into the lungs 2 (two) times daily.  Taking  . albuterol (PROVENTIL HFA;VENTOLIN HFA) 108 (90 Base) MCG/ACT inhaler Inhale 2 puffs into the lungs every 6 (six) hours as needed for wheezing or shortness of breath.   Taking  . aspirin 81 MG tablet Take 81 mg by mouth daily.   Taking  . atorvastatin (LIPITOR) 10 MG tablet Take 10 mg by mouth daily.   Taking  . docusate sodium (COLACE) 100 MG capsule Take 100 mg by mouth 2 (two) times daily.   Taking  . ergocalciferol (VITAMIN D2) 50000 units capsule Take 1 capsule (50,000 Units total) by mouth once a week. 12 capsule 1   . feeding supplement, ENSURE ENLIVE, (ENSURE ENLIVE) LIQD Take 237 mLs by mouth 3 (three) times daily between meals. 237 mL 12 Taking  . fluticasone (FLONASE) 50 MCG/ACT nasal spray  Place 2 sprays into both nostrils daily.   Taking  . GLIPIZIDE XL 2.5 MG 24 hr tablet Take 2.5 mg by mouth daily with breakfast.    Taking  . lidocaine-prilocaine (EMLA) cream Apply 1 application topically as needed.     [name] lisinopril (PRINIVIL,ZESTRIL) 40 MG tablet Take 40 mg by mouth daily.    Not Taking  . loperamide (IMODIUM) 2 MG capsule One pill after each loose stool; maximum upto 8 pills a day. 40 capsule 0 Taking  . loratadine (CLARITIN) 10 MG tablet Take 10 mg by mouth daily.   Taking  . metoCLOPramide (REGLAN) 10 MG tablet Take 1 tablet (10 mg total) by mouth 4 (four) times daily. 120 tablet 3   . montelukast (SINGULAIR) 10 MG tablet Take 10 mg by mouth at bedtime.   Taking  . omeprazole (PRILOSEC) 20 MG capsule Take 20 mg by mouth every morning.    Taking  . oxyCODONE-acetaminophen (PERCOCET/ROXICET) 5-325 MG tablet Take 1 tablet by mouth every 8 (eight) hours as needed for severe pain. 90 tablet 0   . OXYGEN Inhale 2 L into the lungs continuous.    Taking  . SPIRIVA HANDIHALER 18 MCG inhalation capsule Place 18 mcg into inhaler and inhale every morning.    Taking  . tiZANidine (ZANAFLEX) 2 MG tablet Take 1 tablet by mouth 2 (two) times daily.   Taking   Scheduled:  . abemaciclib  150 mg Oral BID  . aspirin EC  81 mg Oral Daily  . atorvastatin  10 mg Oral Daily  . docusate sodium  100 mg Oral BID  . feeding supplement (ENSURE ENLIVE)  237 mL Oral TID BM  . fluticasone  2 spray Each Nare Daily  . heparin subcutaneous  5,000 Units Subcutaneous Q8H  . insulin aspart  0-9 Units Subcutaneous Q4H  . loratadine  10 mg Oral Daily  . mometasone-formoterol  2 puff Inhalation BID  . montelukast  10 mg Oral QHS  . pantoprazole  40 mg Oral Daily  . sodium chloride flush  3 mL Intravenous Q12H  . tiotropium  18 mcg Inhalation [id number]  . tiZANidine  2 mg Oral BID   Infusions:  . 0.9 % NaCl with KCl 20 mEq / L Stopped (06/09/17 1300)  . calcium chloride    . cefTRIAXone (ROCEPHIN)  IV     . magnesium sulfate 1 - 4 g bolus IVPB    . metronidazole Stopped (06/09/17 1026)  . norepinephrine (LEVOPHED) Adult infusion    . potassium PHOSPHATE IVPB (mmol)     PRN: acetaminophen **OR** acetaminophen, albuterol, bisacodyl, guaiFENesin, HYDROcodone-acetaminophen, ipratropium, loperamide, magnesium citrate, ondansetron **OR** ondansetron (ZOFRAN) IV, oxyCODONE-acetaminophen, senna-docusate  Anti-infectives    Start     Dose/Rate Route Frequency Ordered Stop   06/09/17 2000  cefTRIAXone (ROCEPHIN) 2 g in dextrose 5 % 50 mL IVPB     2 g 100 mL/hr over 30 Minutes Intravenous Every 24 hours 06/09/17 0407     06/09/17 0900  metroNIDAZOLE (FLAGYL) IVPB 500 mg     500 mg 100 mL/hr over 60 Minutes Intravenous Every 8 hours 06/09/17 0838     06/09/17 0415  cefTRIAXone (ROCEPHIN) 1 g in dextrose 5 % 50 mL IVPB     1 g 100 mL/hr over 30 Minutes Intravenous  Once 06/09/17 0407 06/09/17 0530   06/09/17 0345  cefTRIAXone (ROCEPHIN) 2 g in dextrose 5 % 50 mL IVPB  Status:  Discontinued     2 g 100 mL/hr over 30 Minutes Intravenous  Once 06/09/17 0331 06/09/17 0351   06/09/17 0015  cefTRIAXone (ROCEPHIN) 1 g in dextrose 5 % 50 mL IVPB - Premix     1 g 100 mL/hr over 30 Minutes Intravenous  Once 06/09/17 0012 06/09/17 0053     Goal of Therapy:  Electrolytes WNL  Plan:  Calcium 2gm already ordered as well as potassium phos 56mmol by provider. Will add KCl 21meq iv x 3, will recheck K this evening and BMP tomorrow with AM Labs   10/14@1929  Discontinued potassium due to K Phos containing significant potassium, will recheck potassium at 2300 and determine if supplementation needed.   Donna Christen Ashe [place] 06/09/2017,7:29 PM

## 2017-06-09 NOTE — ED Notes (Signed)
Reported patient's BP to MD Dahlia Client

## 2017-06-09 NOTE — Progress Notes (Signed)
Spoke with RN.  [facility] currently at bedside placing PICC

## 2017-06-09 NOTE — H&P (Signed)
History and Physical   [facility] - [place] @ [facility] Admission History and Physical [employer], D.O.    Patient Name: Sophia Nelson MR#: [id number] Date of Birth: Feb 17, 1946 Date of Admission: 06/08/2017  Referring MD/NP/PA: Dr. Dahlia Client Primary Care Physician: Donnie Coffin, MD  Chief Complaint:  Chief Complaint  Patient presents with  . Loss of Consciousness    unwitnessed - found on floor  . Hypoglycemia    HPI: Sophia Nelson is a 71 y.o. female with a known history of bilateral breast cancer with bone metastasesstatus post mastectomy, diabetes, GERD, hypertension, home O2 dependent COPD, chronic hypocalcemia presents to the emergency department for evaluation of syncope.  Patient was transported to the emergency department after being found on the floor by her family after an unknown amount of time. Patient is unable to describe the events leading up to the fall. Currently recall that she was down on the ground. She reports that she has been well in the last couple of days, has normal diet and appetiteand has been taking her medications as prescribed.  Per emergency department records patient reported some blurry vision and headache yesterday however on my questioning patient denied these complaints.her only complaint at this time is being cold.  Patient denies fevers/chills, weakness, dizziness, chest pain, shortness of breath, N/V/C/D, abdominal pain, dysuria/frequency, changes in mental status.    Patient is taking monthly chemotherapy injections for her breast cancer.Otherwise there has been no change in status. Patient has been taking medication as prescribed and there has been no recent change in medication or diet.  No recent antibiotics.  There has been no recent illness, hospitalizations, travel or sick contacts.    EMS/ED Course: patient was found by EMS to be hypoglycemic with a blood sugar of 40 for which she received glucose. In the emergency department she  received potassium, magnesium and calcium as well as Rocephin and normal saline. Medical admission has been requested for further management of sepsis secondary to urinary tract infection,electrolyte derangements including hypocalcemia, hypomagnesemia and hypokalemia  Review of Systems:  CONSTITUTIONAL: Positive chills. No fever, fatigue, weakness, weight gain/loss, headache. EYES: No blurry or double vision. ENT: No tinnitus, postnasal drip, redness or soreness of the oropharynx. RESPIRATORY: No cough, dyspnea, wheeze.  No hemoptysis.  CARDIOVASCULAR: No chest pain, palpitations, syncope, orthopnea. No lower extremity edema.  GASTROINTESTINAL: No nausea, vomiting, abdominal pain, diarrhea, constipation.  No hematemesis, melena or hematochezia. GENITOURINARY: No dysuria, frequency, hematuria. ENDOCRINE: No polyuria or nocturia. No heat or cold intolerance. HEMATOLOGY: No anemia, bruising, bleeding. INTEGUMENTARY: No rashes, ulcers, lesions. MUSCULOSKELETAL: No arthritis, gout, dyspnea. NEUROLOGIC: No numbness, tingling, ataxia, seizure-type activity, weakness. PSYCHIATRIC: No anxiety, depression, insomnia.   Past Medical History:  Diagnosis Date  . Abdominal distention   . AKI (acute kidney injury) ([place]) 02/24/2017  . Anemia   . Arthritis   . Asthma   . Back pain, chronic 03/15/2017  . Breast cancer ([place]) 2009   RT MASTECTOMY, DCIS  . Cancer of left breast ([place]) 08/09/2015   T4 N1 M1 tumor, INVASIVE LOBULAR CARCINOMA.  . Carcinoma of overlapping sites of left breast in female, estrogen receptor positive ([place]) 03/22/2016  . COPD (chronic obstructive pulmonary disease) ([place])   . Diabetes ([place]) 02/24/2017  . Diabetes mellitus without complication ([place])   . Duodenal obstruction   . Encounter for nasogastric (NG) tube placement   . Gastric outlet obstruction 02/24/2017  . GERD (gastroesophageal reflux disease)   . Headache    h/o migraines  as a child  . Hypertension   . Neck pain  04/19/2016  . Pancreatitis, acute 03/04/2017  . Pneumonia 2015  . Recurrent low back pain 03/15/2017  . Shortness of breath dyspnea    with exertion    Past Surgical History:  Procedure Laterality Date  . ABDOMINAL HYSTERECTOMY    . BREAST SURGERY Right 2015   mastectomy  . ESOPHAGOGASTRODUODENOSCOPY N/A 02/25/2017   Procedure: ESOPHAGOGASTRODUODENOSCOPY (EGD);  Surgeon: Jonathon Bellows, MD;  Location: [facility] ENDOSCOPY;  Service: Endoscopy;  Laterality: N/A;  . ESOPHAGOGASTRODUODENOSCOPY (EGD) WITH PROPOFOL N/A 03/06/2017   Procedure: ESOPHAGOGASTRODUODENOSCOPY (EGD) WITH PROPOFOL;  Surgeon: [name], MD;  Location: [facility] ENDOSCOPY;  Service: Endoscopy;  Laterality: N/A;  . GASTROJEJUNOSTOMY N/A 03/08/2017   Procedure: Roux-en-Y gastrojejunostomy Bypass of Gastric Outlet Obstruction, small bowel resection;  Surgeon: Vickie Epley, MD;  Location: [facility] ORS;  Service: General;  Laterality: N/A;  . SIMPLE MASTECTOMY WITH AXILLARY SENTINEL NODE BIOPSY Left 02/29/2016   Procedure: SIMPLE MASTECTOMY;  Surgeon: Christene Lye, MD;  Location: [facility] ORS;  Service: General;  Laterality: Left;  . TUBAL LIGATION       reports that she has been smoking Cigarettes.  She has a 3.75 pack-year smoking history. She has never used smokeless tobacco. She reports that she does not drink alcohol or use drugs.  Allergies  Allergen Reactions  . Other     Onions(that grow in the yard-pt can eat onions without problems) and dust mite Sneezing, cough, runny nose    Family History  Problem Relation Age of Onset  . Lung cancer Father   . Breast cancer Neg Hx     Prior to Admission medications   Medication Sig Start Date End Date Taking? Authorizing Provider  Abemaciclib 150 MG TABS Take 150 mg by mouth 2 (two) times daily. 05/02/17  Yes [name], MD  ADVAIR DISKUS 500-50 MCG/DOSE AEPB Inhale 1 puff into the lungs 2 (two) times daily.  05/04/15  Yes [provider]  albuterol (PROVENTIL  HFA;VENTOLIN HFA) 108 (90 Base) MCG/ACT inhaler Inhale 2 puffs into the lungs every 6 (six) hours as needed for wheezing or shortness of breath.   Yes [provider]  aspirin 81 MG tablet Take 81 mg by mouth daily.   Yes [provider]  atorvastatin (LIPITOR) 10 MG tablet Take 10 mg by mouth daily.   Yes [provider]  docusate sodium (COLACE) 100 MG capsule Take 100 mg by mouth 2 (two) times daily.   Yes [provider]  ergocalciferol (VITAMIN D2) 50000 units capsule Take 1 capsule (50,000 Units total) by mouth once a week. 05/30/17  Yes [name], MD  feeding supplement, ENSURE ENLIVE, (ENSURE ENLIVE) LIQD Take 237 mLs by mouth 3 (three) times daily between meals. 03/13/17  Yes Fritzi Mandes, MD  fluticasone (FLONASE) 50 MCG/ACT nasal spray Place 2 sprays into both nostrils daily.   Yes [provider]  GLIPIZIDE XL 2.5 MG 24 hr tablet Take 2.5 mg by mouth daily with breakfast.  06/23/15  Yes [provider]  lidocaine-prilocaine (EMLA) cream Apply 1 application topically as needed. 04/26/17  Yes [provider]  lisinopril (PRINIVIL,ZESTRIL) 40 MG tablet Take 40 mg by mouth daily.  01/03/17  Yes [provider]  loperamide (IMODIUM) 2 MG capsule One pill after each loose stool; maximum upto 8 pills a day. 05/02/17  Yes [name], MD  loratadine (CLARITIN) 10 MG tablet Take 10 mg by mouth daily.  Yes [provider]  metoCLOPramide (REGLAN) 10 MG tablet Take 1 tablet (10 mg total) by mouth 4 (four) times daily. 05/30/17  Yes [name], MD  montelukast (SINGULAIR) 10 MG tablet Take 10 mg by mouth at bedtime.   Yes [provider]  omeprazole (PRILOSEC) 20 MG capsule Take 20 mg by mouth every morning.  06/23/15  Yes [provider]  oxyCODONE-acetaminophen (PERCOCET/ROXICET) 5-325 MG tablet Take 1 tablet by mouth every 8 (eight) hours as needed for severe pain. 06/01/17   Yes [name], MD  OXYGEN Inhale 2 L into the lungs continuous.    Yes [provider]  SPIRIVA HANDIHALER 18 MCG inhalation capsule Place 18 mcg into inhaler and inhale every morning.  05/04/15  Yes [provider]  tiZANidine (ZANAFLEX) 2 MG tablet Take 1 tablet by mouth 2 (two) times daily. 04/12/17  Yes [provider]    Physical Exam: Vitals:   06/09/17 0103 06/09/17 0107 06/09/17 0115 06/09/17 0130  BP: (!) 68/45 (!) 64/42 (!) 73/56 (!) 73/40  Pulse: 86 85 88 90  Resp: (!) 24 14 16 15   Temp:      TempSrc:      SpO2: 100% 100% 100% 100%  Weight:      Height:        GENERAL: 71 y.o.-year-old female patient, well-developed, well-nourished lying in the bed in no acute distress.  Pleasant and cooperative.   HEENT: Head atraumatic, normocephalic. Pupils equal. Mucus membranes moist. NECK: Supple, full range of motion. No JVD, no bruit heard. No thyroid enlargement, no tenderness, no cervical lymphadenopathy. CHEST: Normal breath sounds bilaterally. No wheezing, rales, rhonchi or crackles. No use of accessory muscles of respiration.  No reproducible chest wall tenderness.  CARDIOVASCULAR: S1, S2 normal. No murmurs, rubs, or gallops. Cap refill <2 seconds. Pulses intact distally.  ABDOMEN: Soft, nondistended, mild midepigastric tenderness to palpation at the site of recent surgery.  No rebound, guarding, rigidity. Normoactive bowel sounds present in all four quadrants.  EXTREMITIES: No pedal edema, cyanosis, or clubbing. No calf tenderness or Homan's sign.  NEUROLOGIC: The patient is alert and oriented x 3. Cranial nerves II through XII are grossly intact with no focal sensorimotor deficit. PSYCHIATRIC:  Normal affect, mood, thought content. SKIN: Warm, dry, and intact without obvious rash, lesion, or ulcer.    Labs on Admission:  CBC:  Recent Labs Lab 06/08/17 2325  WBC 3.7  HGB 8.3*  HCT 24.8*  MCV 82.6  PLT 956   Basic Metabolic  Panel:  Recent Labs Lab 06/08/17 2325  NA 128*  K 2.7*  CL 92*  CO2 24  GLUCOSE 297*  BUN 24*  CREATININE 2.88*  CALCIUM 5.2*  MG 1.4*   GFR: Estimated Creatinine Clearance: 14.3 mL/min (A) (by C-G formula based on SCr of 2.88 mg/dL (H)). Liver Function Tests: No results for input(s): AST, ALT, ALKPHOS, BILITOT, PROT, ALBUMIN in the last 168 hours. No results for input(s): LIPASE, AMYLASE in the last 168 hours. No results for input(s): AMMONIA in the last 168 hours. Coagulation Profile: No results for input(s): INR, PROTIME in the last 168 hours. Cardiac Enzymes:  Recent Labs Lab 06/08/17 2325  CKTOTAL 502*  TROPONINI <0.03   BNP (last 3 results) No results for input(s): PROBNP in the last 8760 hours. HbA1C: No results for input(s): HGBA1C in the last 72 hours. CBG:  Recent Labs Lab 06/08/17 2300 06/08/17 2341  GLUCAP 46* 191*   Lipid Profile: No results  for input(s): CHOL, HDL, LDLCALC, TRIG, CHOLHDL, LDLDIRECT in the last 72 hours. Thyroid Function Tests: No results for input(s): TSH, T4TOTAL, FREET4, T3FREE, THYROIDAB in the last 72 hours. Anemia Panel: No results for input(s): VITAMINB12, FOLATE, FERRITIN, TIBC, IRON, RETICCTPCT in the last 72 hours. Urine analysis:    Component Value Date/Time   COLORURINE AMBER (A) 06/08/2017 2325   APPEARANCEUR CLOUDY (A) 06/08/2017 2325   LABSPEC 1.015 06/08/2017 2325   PHURINE 5.0 06/08/2017 2325   GLUCOSEU 50 (A) 06/08/2017 2325   HGBUR MODERATE (A) 06/08/2017 2325   BILIRUBINUR NEGATIVE 06/08/2017 2325   KETONESUR 5 (A) 06/08/2017 2325   PROTEINUR 100 (A) 06/08/2017 2325   NITRITE NEGATIVE 06/08/2017 2325   LEUKOCYTESUR NEGATIVE 06/08/2017 2325   Sepsis Labs: @LABRCNTIP (procalcitonin:4,lacticidven:4) )No results found for this or any previous visit (from the past 240 hour(s)).   Radiological Exams on Admission: Ct Head Wo Contrast  Result Date: 06/09/2017 CLINICAL DATA:  Initial evaluation for acute  trauma, fall. EXAM: CT HEAD WITHOUT CONTRAST TECHNIQUE: Contiguous axial images were obtained from the base of the skull through the vertex without intravenous contrast. COMPARISON:  Prior CT from 11/02/2016. FINDINGS: Brain: Cerebral volume within normal limits for patient age. No evidence for acute intracranial hemorrhage. No findings to suggest acute large vessel territory infarct. No mass lesion, midline shift, or mass effect. Ventricles are normal in size without evidence for hydrocephalus. No extra-axial fluid collection identified. Vascular: No hyperdense vessel identified. Skull: Scalp soft tissues demonstrate no acute abnormality.Calvarium intact. Focal exostosis extending from the left occipital calvarium again noted. Sinuses/Orbits: Globes and orbital soft tissues are within normal limits. Visualized paranasal sinuses are clear. No mastoid effusion. IMPRESSION: Negative head CT.  No acute intracranial abnormality identified. Electronically Signed   By: Jeannine Boga M.D.   On: 06/09/2017 00:35    EKG: Normal sinus rhythm at 88 bpm with normal axis, prolonged QTc and nonspecific ST-T wave changes.   Assessment/Plan  This is a 71 y.o. female with a history of bilateral breast cancer with bone metastasesstatus post mastectomy, diabetes, GERD, hypertension, home O2 dependent COPD, chronic hypocalcemia now being admitted with:  #. Sepsis 2/2 UTI, hypotension, hypothermia - Admit to inpatient with telemetry monitoring - IV antibiotics: Rocephin - IV fluid hydration - Follow up blood,urine & sputum cultures - Repeat CBC in am.  - CCM consultation has been requested  #. AKI possibly 2/2 mild rhabdo - IV fluids hydration - Repeat BMP in AM - Avoid nephrotoxic medications: hold lisinopril - Bladder scan and place foley catheter if evidence of urinary retention  #. Diabetes with hypoglycemia - Accuchecks hourly until stabilized - Hold glipizide  #. Electrolyte derangements  including hypocalcemia, acute on chronic, hypomag, hyponatremia, hypokalemia - Check PTH, ionized Ca, Vit D, phos - Replace as needed - Follow BMPs  #. History of breast cancer - Continue abemaciclib  #. History of hyperlipidemia - Continue lipitor  #. History of asthma - Continue Flonase, Singulair, Advair, Spiriva  #. History of GERD - Continue Prilosec  Admission status: Inpatient stepdown IV Fluids: NS with KCl Diet/Nutrition: Heart healthy, carb controlled Consults called: CCM  DVT Px: Lovenox, SCDs and early ambulation. Code Status: Full Code  Disposition Plan: To be determined  All the records are reviewed and case discussed with ED provider. Management plans discussed with the patient and/or family who express understanding and agree with plan of care.  Canna Nickelson D.O. on 06/09/2017 at 1:39 AM Between 7am to 6pm - Pager -  [phone] After 6pm go to [email or web address] - [employer] executive [place] [facility] Office [phone] CC: Primary care physician; Donnie Coffin, MD   06/09/2017, 1:39 AM

## 2017-06-09 NOTE — Progress Notes (Signed)
Dr Lyndel Safe made aware of P 1.0, Mg 1.8, Ca 5.0 and K 3.0. Orders for replacement.

## 2017-06-10 DIAGNOSIS — C50919 Malignant neoplasm of unspecified site of unspecified female breast: Secondary | ICD-10-CM

## 2017-06-10 DIAGNOSIS — I959 Hypotension, unspecified: Secondary | ICD-10-CM

## 2017-06-10 LAB — URINE CULTURE: CULTURE: NO GROWTH

## 2017-06-10 LAB — GLUCOSE, CAPILLARY
GLUCOSE-CAPILLARY: 91 mg/dL (ref 65–99)
GLUCOSE-CAPILLARY: 110 mg/dL — AB (ref 65–99)
Glucose-Capillary: 127 mg/dL — ABNORMAL HIGH (ref 65–99)
Glucose-Capillary: 155 mg/dL — ABNORMAL HIGH (ref 65–99)
Glucose-Capillary: 160 mg/dL — ABNORMAL HIGH (ref 65–99)
Glucose-Capillary: 165 mg/dL — ABNORMAL HIGH (ref 65–99)
Glucose-Capillary: 189 mg/dL — ABNORMAL HIGH (ref 65–99)

## 2017-06-10 LAB — COMPREHENSIVE METABOLIC PANEL
ALT: 32 U/L (ref 14–54)
AST: 48 U/L — AB (ref 15–41)
Albumin: 1.8 g/dL — ABNORMAL LOW (ref 3.5–5.0)
Alkaline Phosphatase: 127 U/L — ABNORMAL HIGH (ref 38–126)
Anion gap: 6 (ref 5–15)
BUN: 10 mg/dL (ref 6–20)
CHLORIDE: 107 mmol/L (ref 101–111)
CO2: 22 mmol/L (ref 22–32)
CREATININE: 0.97 mg/dL (ref 0.44–1.00)
Calcium: 6.6 mg/dL — ABNORMAL LOW (ref 8.9–10.3)
GFR calc Af Amer: >60 mL/min (ref >60)
GFR calc non Af Amer: 58 mL/min — ABNORMAL LOW (ref >60)
GLUCOSE: 136 mg/dL — AB (ref 65–99)
Potassium: 3.5 mmol/L (ref 3.5–5.1)
SODIUM: 135 mmol/L (ref 135–145)
Total Bilirubin: 0.5 mg/dL (ref 0.3–1.2)
Total Protein: 5 g/dL — ABNORMAL LOW (ref 6.5–8.1)

## 2017-06-10 LAB — RENAL FUNCTION PANEL
Albumin: 1.9 g/dL — ABNORMAL LOW (ref 3.5–5.0)
Anion gap: 5 (ref 5–15)
BUN: 9 mg/dL (ref 6–20)
CO2: 24 mmol/L (ref 22–32)
CREATININE: 0.82 mg/dL (ref 0.44–1.00)
Calcium: 6.3 mg/dL — CL (ref 8.9–10.3)
Chloride: 107 mmol/L (ref 101–111)
Glucose, Bld: 101 mg/dL — ABNORMAL HIGH (ref 65–99)
POTASSIUM: 3.4 mmol/L — AB (ref 3.5–5.1)
Phosphorus: 2.3 mg/dL — ABNORMAL LOW (ref 2.5–4.6)
Sodium: 136 mmol/L (ref 135–145)

## 2017-06-10 LAB — MAGNESIUM: Magnesium: 1.7 mg/dL (ref 1.7–2.4)

## 2017-06-10 LAB — PROCALCITONIN: PROCALCITONIN: 0.7 ng/mL

## 2017-06-10 MED ORDER — POTASSIUM PHOSPHATES 15 MMOLE/5ML IV SOLN
15.0000 mmol | Freq: Once | INTRAVENOUS | Status: AC
  Administered 2017-06-10: 15 mmol via INTRAVENOUS
  Filled 2017-06-10: qty 5

## 2017-06-10 MED ORDER — MORPHINE SULFATE (PF) 2 MG/ML IV SOLN
1.0000 mg | INTRAVENOUS | Status: DC | PRN
  Administered 2017-06-10 – 2017-06-13 (×2): 2 mg via INTRAVENOUS
  Filled 2017-06-10 (×2): qty 1

## 2017-06-10 MED ORDER — POTASSIUM CHLORIDE 2 MEQ/ML IV SOLN
INTRAVENOUS | Status: DC
  Administered 2017-06-10 – 2017-06-12 (×3): via INTRAVENOUS
  Filled 2017-06-10 (×4): qty 1000

## 2017-06-10 MED ORDER — MAGNESIUM SULFATE 2 GM/50ML IV SOLN
2.0000 g | Freq: Once | INTRAVENOUS | Status: AC
  Administered 2017-06-10: 2 g via INTRAVENOUS
  Filled 2017-06-10: qty 50

## 2017-06-10 MED ORDER — POTASSIUM CHLORIDE CRYS ER 20 MEQ PO TBCR: 40.0000 meq | EXTENDED_RELEASE_TABLET | Freq: Two times a day (BID) | ORAL | Status: DC

## 2017-06-10 NOTE — Progress Notes (Signed)
Patient remains alert and oriented at this time. Pt remains on [place]. Pt's Levophed has been decreased to 8 mcg. Pt in stable condition. Full assessment in EPIC. Report given to oncoming nurse.

## 2017-06-10 NOTE — Plan of Care (Signed)
Problem: Education: Goal: Knowledge of [facility] Education information/materials will improve Outcome: Progressing Pt oriented to the unit and introduced to staff. Pt provided with patient handbook.  Pt had questions answered.  Problem: Safety: Goal: Ability to remain free from injury will improve Outcome: Progressing Pt has remained free from injury on my shift. Pt able to verbalize and demonstrate understanding of how to use call light.

## 2017-06-10 NOTE — Evaluation (Signed)
Physical Therapy Evaluation Patient Details Name: Sophia Nelson MRN: [id number] DOB: [birth year] Today's Date: 06/10/2017   History of Present Illness  Pt is a 71 y.o. F with a PMH including AKI, anemia, breast cancer, COPD, DM, GERD, headaches, HTN, and SOB dyspnea. Presents to ED on 10/13 s/p fall with LOC secondary to hypoglycemia, hypotension; admitted with sepsis secondary to UTI. Imaging negative for any acute abnormalties.  Clinical Impression  Prior to hospital admission, pt was independent-min assist with ADLs, ambulating with a single point cane, driving infrequently.  Pt lives at home with family.  Currently pt is min guard for bed mobility; generalized weakness. Unable to fully assess transfers and gait secondary to low blood pressure, pt on blood pressure drip; plan to assess when medically appropriate. Discussed strict bed rest orders with nursing, who cleared pt to sit on EOB with PT. Pt's BP monitored throughout session; 98/53 beginning of session, 97/51 sitting EOB, 94/57 end of session; no c/o dizziness.  Pt would benefit from skilled PT to address noted impairments and functional limitations (see below for any additional details).  Upon hospital discharge, recommend pt discharge to SNF.     Follow Up Recommendations SNF    Equipment Recommendations  Rolling walker with 5" wheels    Recommendations for Other Services       Precautions / Restrictions Precautions Precautions: Fall Restrictions Weight Bearing Restrictions: No      Mobility  Bed Mobility Overal bed mobility: Needs Assistance Bed Mobility: Supine to Sit     Supine to sit: Min guard     General bed mobility comments: Min guard for safety; vc's for sequencing, railing use; increased time and effort to perform  Transfers                 General transfer comment: deferred d/t low blood pressure; pt on blood pressure drip  Ambulation/Gait                Stairs             Wheelchair Mobility    Modified Rankin (Stroke Patients Only)       Balance Overall balance assessment: Needs assistance Sitting-balance support: Bilateral upper extremity supported Sitting balance-Leahy Scale: Fair Sitting balance - Comments: able to perform light AROM exercises seated EOB with BUE supported       Standing balance comment: deferred d/t low blood pressure; pt on blood pressure drip                             Pertinent Vitals/Pain Pain Assessment: No/denies pain    Home Living Family/patient expects to be discharged to:: Private residence Living Arrangements: Children Available Help at Discharge: Family;Available 24 hours/day Type of Home: House Home Access: Stairs to enter Entrance Stairs-Rails: Right;Left;Can reach both Entrance Stairs-Number of Steps: 5 Home Layout: One level Home Equipment: Cane - single point;Shower seat      Prior Function Level of Independence: Needs assistance         Comments: ambulates with a single point cane; independent with dressing, toileting, light "washing up", some cooking; family member comes to house 1x/wk for bathing; pt drives "sometimes"     Hand Dominance        Extremity/Trunk Assessment   Upper Extremity Assessment Upper Extremity Assessment: Generalized weakness;RUE deficits/detail (LUE at least 3/5) RUE Deficits / Details: RUE assessment deferred secondary to pt reporting pain with any movement secondary to bursitis  issues RUE: Unable to fully assess due to pain    Lower Extremity Assessment Lower Extremity Assessment: Generalized weakness (BLE at least 3/5)    Cervical / Trunk Assessment Cervical / Trunk Assessment: Normal  Communication   Communication: No difficulties  Cognition Arousal/Alertness: Awake/alert Behavior During Therapy: WFL for tasks assessed/performed Overall Cognitive Status: Within Functional Limits for tasks assessed                                         General Comments      Exercises Total Joint Exercises Ankle Circles/Pumps: AROM;Both;10 reps;Supine;Seated (x2) Short Arc Quad: AROM;Both;10 reps;Seated   Assessment/Plan    PT Assessment Patient needs continued PT services  PT Problem List Decreased strength;Decreased range of motion;Decreased activity tolerance;Decreased mobility;Cardiopulmonary status limiting activity       PT Treatment Interventions DME instruction;Therapeutic exercise;Gait training;Stair training;Functional mobility training;Therapeutic activities;Balance training;Patient/family education    PT Goals (Current goals can be found in the Care Plan section)  Acute Rehab PT Goals Patient Stated Goal: to be able to go home PT Goal Formulation: With patient Time For Goal Achievement: 06/24/17 Potential to Achieve Goals: Fair    Frequency Min 2X/week   Barriers to discharge        Co-evaluation               AM-PAC PT "6 Clicks" Daily Activity  Outcome Measure Difficulty turning over in bed (including adjusting bedclothes, sheets and blankets)?: A Little Difficulty moving from lying on back to sitting on the side of the bed? : A Little Difficulty sitting down on and standing up from a chair with arms (e.g., wheelchair, bedside commode, etc,.)?: A Lot Help needed moving to and from a bed to chair (including a wheelchair)?: A Lot Help needed walking in hospital room?: A Lot Help needed climbing 3-5 steps with a railing? : A Lot 6 Click Score: 14    End of Session Equipment Utilized During Treatment: Oxygen Activity Tolerance: Patient tolerated treatment well Patient left: in bed;with call bell/phone within reach Nurse Communication: Mobility status (vitals status) PT Visit Diagnosis: Muscle weakness (generalized) (M62.81);Other abnormalities of gait and mobility (R26.89)    Time: 2671-2458 PT Time Calculation (min) (ACUTE ONLY): 24 min   Charges:         PT G CodesWetzel Bjornstad, SPT 06/10/2017, 12:12 PM

## 2017-06-10 NOTE — Progress Notes (Signed)
[place] [facility] [place] NAME: Sophia Nelson    MR#:  [id number]  DATE OF BIRTH:  12-23-1945  SUBJECTIVE:  CHIEF COMPLAINT:   Chief Complaint  Patient presents with  . Loss of Consciousness    unwitnessed - found on floor  . Hypoglycemia   Feels weak. On levophed.  REVIEW OF SYSTEMS:    Review of Systems  Constitutional: Positive for malaise/fatigue. Negative for chills and fever.  HENT: Negative for sore throat.   Eyes: Negative for blurred vision, double vision and pain.  Respiratory: Negative for cough, hemoptysis, shortness of breath and wheezing.   Cardiovascular: Negative for chest pain, palpitations, orthopnea and leg swelling.  Gastrointestinal: Negative for abdominal pain, constipation, diarrhea, heartburn, nausea and vomiting.  Genitourinary: Negative for dysuria and hematuria.  Musculoskeletal: Negative for back pain and joint pain.  Skin: Negative for rash.  Neurological: Positive for weakness. Negative for sensory change, speech change, focal weakness and headaches.  Endo/Heme/Allergies: Does not bruise/bleed easily.  Psychiatric/Behavioral: Negative for depression. The patient is not nervous/anxious.     DRUG ALLERGIES:   Allergies  Allergen Reactions  . Other     Onions(that grow in the yard-pt can eat onions without problems) and dust mite Sneezing, cough, runny nose    VITALS:  Blood pressure (!) 114/54, pulse 73, temperature 97.6 F (36.4 C), temperature source Oral, resp. rate 16, height 5' 1.5" (1.562 m), weight 62.4 kg (137 lb 9.1 oz), SpO2 100 %.  PHYSICAL EXAMINATION:   Physical Exam  GENERAL:  71 y.o.-year-old patient lying in the bed with no acute distress.  EYES: Pupils equal, round, reactive to light and accommodation. No scleral icterus. Extraocular muscles intact.  HEENT: Head atraumatic, normocephalic. Oropharynx and nasopharynx clear.  NECK:  Supple, no jugular venous distention. No thyroid  enlargement, no tenderness.  LUNGS: Normal breath sounds bilaterally, no wheezing, rales, rhonchi. No use of accessory muscles of respiration.  CARDIOVASCULAR: S1, S2 normal. No murmurs, rubs, or gallops.  ABDOMEN: Soft, nontender, nondistended. Bowel sounds present. No organomegaly or mass.  EXTREMITIES: No cyanosis, clubbing or edema b/l.    NEUROLOGIC: Cranial nerves II through XII are intact. No focal Motor or sensory deficits b/l.   PSYCHIATRIC: The patient is alert and awake SKIN: No obvious rash, lesion, or ulcer.   LABORATORY PANEL:   CBC  Recent Labs Lab 06/09/17 1441  WBC 2.7*  HGB 8.4*  HCT 25.2*  PLT 229   ------------------------------------------------------------------------------------------------------------------ Chemistries   Recent Labs Lab 06/10/17 0011 06/10/17 0411  NA 135 136  K 3.5 3.4*  CL 107 107  CO2 22 24  GLUCOSE 136* 101*  BUN 10 9  CREATININE 0.97 0.82  CALCIUM 6.6* 6.3*  MG  --  1.7  AST 48*  --   ALT 32  --   ALKPHOS 127*  --   BILITOT 0.5  --    ------------------------------------------------------------------------------------------------------------------  Cardiac Enzymes  Recent Labs Lab 06/08/17 2325  TROPONINI <0.03   ------------------------------------------------------------------------------------------------------------------  RADIOLOGY:  Dg Abd 1 View  Result Date: 06/09/2017 CLINICAL DATA:  Acute onset of generalized abdominal pain. Initial encounter. EXAM: ABDOMEN - 1 VIEW COMPARISON:  Abdominal radiograph performed 03/07/2017 FINDINGS: The visualized bowel gas pattern is unremarkable. Scattered air filled loops of colon are seen; no abnormal dilatation of small bowel loops is seen to suggest small bowel obstruction. No free intra-abdominal air is identified, though evaluation for free air is limited on a single supine view. The visualized  osseous structures are within normal limits; the sacroiliac joints are  unremarkable in appearance. The visualized lung bases are essentially clear. IMPRESSION: Unremarkable bowel gas pattern; no free intra-abdominal air seen. Electronically Signed   By: Garald Balding M.D.   On: 06/09/2017 05:31   Ct Head Wo Contrast  Result Date: 06/09/2017 CLINICAL DATA:  Initial evaluation for acute trauma, fall. EXAM: CT HEAD WITHOUT CONTRAST TECHNIQUE: Contiguous axial images were obtained from the base of the skull through the vertex without intravenous contrast. COMPARISON:  Prior CT from 11/02/2016. FINDINGS: Brain: Cerebral volume within normal limits for patient age. No evidence for acute intracranial hemorrhage. No findings to suggest acute large vessel territory infarct. No mass lesion, midline shift, or mass effect. Ventricles are normal in size without evidence for hydrocephalus. No extra-axial fluid collection identified. Vascular: No hyperdense vessel identified. Skull: Scalp soft tissues demonstrate no acute abnormality.Calvarium intact. Focal exostosis extending from the left occipital calvarium again noted. Sinuses/Orbits: Globes and orbital soft tissues are within normal limits. Visualized paranasal sinuses are clear. No mastoid effusion. IMPRESSION: Negative head CT.  No acute intracranial abnormality identified. Electronically Signed   By: Jeannine Boga M.D.   On: 06/09/2017 00:35   Dg Chest Port 1 View  Result Date: 06/09/2017 CLINICAL DATA:  Line placement. EXAM: PORTABLE CHEST 1 VIEW COMPARISON:  03/06/2017 FINDINGS: Right internal jugular approach central venous catheter with tip at the expected location of the distal superior vena cava. Cardiomediastinal silhouette is normal. Mediastinal contours appear intact. Calcific atherosclerotic disease of the aorta. There is no evidence of pneumothorax. Left pleural effusion and basilar atelectasis. Osseous structures are without acute abnormality. Soft tissues are grossly normal. IMPRESSION: Right internal jugular  approach central venous catheter tip at the expected location of the distal superior vena cava. Persistent left pleural effusion and subsegmental atelectasis. Electronically Signed   By: Fidela Salisbury M.D.   On: 06/09/2017 19:21   ASSESSMENT AND PLAN:   This is a 71 y.o. female with a history of bilateral breast cancer with bone metastasesstatus post mastectomy, diabetes, GERD, hypertension, home O2 dependent COPD, chronic hypocalcemia now being admitted with:  # septic shock secondary to UTI On IV antibiotics. Continue levo fed. Wait for final culture and sensitivities.  #. AKI  due to septic shock/ATN Improving wit  #. Diabetes with hypoglycemia - resolved - Accuchecks  - Hold glipizide  #. History of breast cancer - Continue abemaciclib  #. History of hyperlipidemia - Continue lipitor  #. History of asthma - Continue Flonase, Singulair, Advair, Spiriva  #. History of GERD - Continue Prilosec   All the records are reviewed and case discussed with Care Management/Social Workerr. Management plans discussed with the patient, family and they are in agreement.  CODE STATUS: FULL CODE  DVT Prophylaxis: SCDs  TOTAL TIME TAKING CARE OF THIS PATIENT: 35 minutes.   POSSIBLE D/C IN 2-3 DAYS, DEPENDING ON CLINICAL CONDITION.  Hillary Bow R M.D on 06/10/2017 at 1:10 PM  Between 7am to 6pm - Pager - [phone]  After 6pm go to [email or web address] - password [email or web address] [facility]  Office  [phone]  CC: Primary care physician; Donnie Coffin, MD  Note: This dictation was prepared with Dragon dictation along with smaller phrase technology. Any transcriptional errors that result from this process are unintentional.

## 2017-06-10 NOTE — Progress Notes (Signed)
PULMONARY / CRITICAL CARE MEDICINE   Name: Sophia Nelson MRN: [id number] DOB: [birth year]    ADMISSION DATE:  06/08/2017  PT PROFILE:   49 F with history of metastatic breast cancer, DM admitted via ED with syncope, multiple electrolyte abnormalities, hypotension. CT head 10/14: no acute findings  MAJOR EVENTS/TEST RESULTS: 10/13 Admission 10/14 CT head: NAD 10/15 Weaning vasopressors  INDWELLING DEVICES::   MICRO DATA: MRSA PCR 0/14 >> NEG Urine 10/13 >> NEG  ANTIMICROBIALS:  Metronidazole 10/14 >> 10/15 Ceftriaxone 10/14 >>   SUBJECTIVE:  NAD. No new complaints. Somewhat lethargic. Weaning off of vasopressors  VITAL SIGNS: BP (!) 104/59   Pulse 74   Temp 98.1 F (36.7 C) (Oral)   Resp 13   Ht 5' 1.5" (1.562 m)   Wt 62.4 kg (137 lb 9.1 oz)   SpO2 100%   BMI 25.57 kg/m   HEMODYNAMICS:    VENTILATOR SETTINGS:    INTAKE / OUTPUT: I/O last 3 completed shifts: In: 4172.2 [P.O.:320; I.V.:2872.2; IV Piggyback:980] Out: 5397 [Urine:3385]  PHYSICAL EXAMINATION: General: frail appearing, NAD Neuro: no focal deficits HEENT: NCAT, sclerae white Cardiovascular: tachy, regular, no M Lungs: clear anteriorly Abdomen: nondistended, minimally tender Ext: cool, no edema  LABS:  BMET  Recent Labs Lab 06/09/17 1441 06/10/17 0011 06/10/17 0411  NA 134* 135 136  K 3.0* 3.5 3.4*  CL 106 107 107  CO2 22 22 24   BUN 16 10 9   CREATININE 1.59* 0.97 0.82  GLUCOSE 168* 136* 101*    Electrolytes  Recent Labs Lab 06/08/17 2325 06/09/17 1441 06/10/17 0011 06/10/17 0411  CALCIUM 5.2* 5.0* 6.6* 6.3*  MG 1.4* 1.8  --  1.7  PHOS  --  <1.0*  --  2.3*    CBC  Recent Labs Lab 06/08/17 2325 06/09/17 1441  WBC 3.7 2.7*  HGB 8.3* 8.4*  HCT 24.8* 25.2*  PLT 249 229    Coag's No results for input(s): APTT, INR in the last 168 hours.  Sepsis Markers  Recent Labs Lab 06/08/17 2325 06/09/17 1441 06/10/17 0411  LATICACIDVEN 1.3 1.1  --   PROCALCITON   --  1.51 0.70    ABG No results for input(s): PHART, PCO2ART, PO2ART in the last 168 hours.  Liver Enzymes  Recent Labs Lab 06/09/17 1441 06/10/17 0011 06/10/17 0411  AST 73* 48*  --   ALT 38 32  --   ALKPHOS 147* 127*  --   BILITOT 0.9 0.5  --   ALBUMIN 1.9* 1.8* 1.9*    Cardiac Enzymes  Recent Labs Lab 06/08/17 2325  TROPONINI <0.03    Glucose  Recent Labs Lab 06/09/17 1949 06/10/17 0005 06/10/17 0414 06/10/17 0720 06/10/17 1144 06/10/17 1633  GLUCAP 178* 127* 91 110* 155* 165*    CXR: no new film   ASSESSMENT / PLAN: Metastatic breast cancer Recent laparotomy for gastric outlet obstruction due to breast cancer metastasis Syncope Hypotension Chronic hypocalcemia Chronic anemia Mildly elevated PCT  Wean NE to off for MAP > 60 mmHg DC metronidazole Continue ceftriaxone for now Needs goals of care discussion  Merton Border, MD PCCM service Mobile [phone] Pager [phone] 06/10/2017 6:28 PM  06/10/2017, 6:18 PM

## 2017-06-11 ENCOUNTER — Inpatient Hospital Stay: Payer: Medicare HMO

## 2017-06-11 LAB — PTH, INTACT AND CALCIUM
CALCIUM TOTAL (PTH): 4.9 mg/dL (ref 8.7–10.3)
PTH: 584 pg/mL — AB (ref 15–65)

## 2017-06-11 LAB — PREPARE RBC (CROSSMATCH)

## 2017-06-11 LAB — COMPREHENSIVE METABOLIC PANEL
ALBUMIN: 1.6 g/dL — AB (ref 3.5–5.0)
ALK PHOS: 117 U/L (ref 38–126)
ALT: 24 U/L (ref 14–54)
ANION GAP: 4 — AB (ref 5–15)
AST: 30 U/L (ref 15–41)
BUN: <5 mg/dL — ABNORMAL LOW (ref 6–20)
CHLORIDE: 105 mmol/L (ref 101–111)
CO2: 26 mmol/L (ref 22–32)
CREATININE: 0.62 mg/dL (ref 0.44–1.00)
Calcium: 6.4 mg/dL — CL (ref 8.9–10.3)
GFR calc non Af Amer: >60 mL/min (ref >60)
GLUCOSE: 136 mg/dL — AB (ref 65–99)
Potassium: 3.9 mmol/L (ref 3.5–5.1)
SODIUM: 135 mmol/L (ref 135–145)
Total Bilirubin: 0.4 mg/dL (ref 0.3–1.2)
Total Protein: 4.8 g/dL — ABNORMAL LOW (ref 6.5–8.1)

## 2017-06-11 LAB — GLUCOSE, CAPILLARY
GLUCOSE-CAPILLARY: 110 mg/dL — AB (ref 65–99)
Glucose-Capillary: 138 mg/dL — ABNORMAL HIGH (ref 65–99)
Glucose-Capillary: 111 mg/dL — ABNORMAL HIGH (ref 65–99)
Glucose-Capillary: 110 mg/dL — ABNORMAL HIGH (ref 65–99)
Glucose-Capillary: 101 mg/dL — ABNORMAL HIGH (ref 65–99)

## 2017-06-11 LAB — VITAMIN D 25 HYDROXY (VIT D DEFICIENCY, FRACTURES): VIT D 25 HYDROXY: 9.3 ng/mL — AB (ref 30.0–100.0)

## 2017-06-11 LAB — CALCIUM, IONIZED: Calcium, Ionized, Serum: 3 mg/dL — ABNORMAL LOW (ref 4.5–5.6)

## 2017-06-11 LAB — CBC
HCT: 20.3 % — ABNORMAL LOW (ref 35.0–47.0)
HEMOGLOBIN: 6.9 g/dL — AB (ref 12.0–16.0)
MCH: 27.8 pg (ref 26.0–34.0)
MCHC: 33.9 g/dL (ref 32.0–36.0)
MCV: 81.9 fL (ref 80.0–100.0)
PLATELETS: 159 10*3/uL (ref 150–440)
RBC: 2.47 MIL/uL — AB (ref 3.80–5.20)
RDW: 22.1 % — ABNORMAL HIGH (ref 11.5–14.5)
WBC: 2.5 10*3/uL — ABNORMAL LOW (ref 3.6–11.0)

## 2017-06-11 LAB — PROCALCITONIN: PROCALCITONIN: 0.33 ng/mL

## 2017-06-11 MED ORDER — SODIUM CHLORIDE 0.9 % IV SOLN
1.0000 g | Freq: Once | INTRAVENOUS | Status: AC
  Administered 2017-06-11: 1 g via INTRAVENOUS
  Filled 2017-06-11: qty 10

## 2017-06-11 MED ORDER — MORPHINE SULFATE (PF) 2 MG/ML IV SOLN
2.0000 mg | INTRAVENOUS | Status: AC
  Administered 2017-06-11: 2 mg via INTRAVENOUS
  Filled 2017-06-11: qty 1

## 2017-06-11 MED ORDER — FENTANYL 25 MCG/HR TD PT72
25.0000 ug | MEDICATED_PATCH | TRANSDERMAL | Status: DC
  Administered 2017-06-11 – 2017-06-17 (×4): 25 ug via TRANSDERMAL
  Filled 2017-06-11 (×4): qty 1

## 2017-06-11 MED ORDER — SODIUM CHLORIDE 0.9 % IV SOLN: Freq: Once | INTRAVENOUS | Status: DC

## 2017-06-11 NOTE — Plan of Care (Signed)
Problem: Safety: Goal: Ability to remain free from injury will improve Outcome: Progressing Pt has remained free from harm on my shift and has demonstrated ability to use call light.   Problem: Physical Regulation: Goal: Ability to maintain clinical measurements within normal limits will improve Outcome: Progressing Pt's levophed was able to be decreased to 2 mcg/min. Pt BP sustaining WDL at this time.   Problem: Tissue Perfusion: Goal: Risk factors for ineffective tissue perfusion will decrease Outcome: Progressing Pt was able to tolerate SCD's for most of my shift. Pt also on sub q heparin.

## 2017-06-11 NOTE — Progress Notes (Signed)
PULMONARY / CRITICAL CARE MEDICINE   Name: Sophia Nelson MRN: [id number] DOB: 1945/12/08    ADMISSION DATE:  06/08/2017  PT PROFILE:   71 F with history of metastatic breast cancer, DM admitted via ED with syncope, multiple electrolyte abnormalities, hypotension. CT head 10/14: no acute findings  MAJOR EVENTS/TEST RESULTS: 10/13 Admission 10/14 CT head: NAD 10/15 Weaning vasopressors 10/16 Anemic, transfused 1 unit of PRBCs; oncology consulted  INDWELLING DEVICES::  MICRO DATA: MRSA PCR 0/14 >> NEG Urine 10/13 >> NEG  ANTIMICROBIALS:  Metronidazole 10/14 >> 10/15 Ceftriaxone 10/14 >>   SUBJECTIVE: Hemoglobin low at 6.4. Patient offers no complaints except for chronic pain which she says its well controlled with current regimen  VITAL SIGNS: BP (!) 86/44   Pulse 98   Temp 99.2 F (37.3 C) (Oral)   Resp 19   Ht 5' 1.5" (1.562 m)   Wt 137 lb 9.1 oz (62.4 kg)   SpO2 99%   BMI 25.57 kg/m   HEMODYNAMICS:    VENTILATOR SETTINGS:    INTAKE / OUTPUT: I/O last 3 completed shifts: In: 3516.3 [I.V.:2281.3; IV Piggyback:1235] Out: 1027 [Urine:4755]  PHYSICAL EXAMINATION: General: frail appearing, NAD Neuro: AAO X3, no focal deficits HEENT: NCAT, sclerae white Cardiovascular: NSR, S1/S2,  no M Lungs: clear anteriorly, diminished in the bases Abdomen: nondistended, minimally tender Ext: cool, no edema  LABS:  BMET  Recent Labs Lab 06/10/17 0011 06/10/17 0411 06/11/17 0455  NA 135 136 135  K 3.5 3.4* 3.9  CL 107 107 105  CO2 22 24 26   BUN 10 9 <5*  CREATININE 0.97 0.82 0.62  GLUCOSE 136* 101* 136*    Electrolytes  Recent Labs Lab 06/08/17 2325 06/09/17 1441 06/10/17 0011 06/10/17 0411 06/11/17 0455  CALCIUM 5.2* 5.0*  4.9 6.6* 6.3* 6.4*  MG 1.4* 1.8  --  1.7  --   PHOS  --  <1.0*  --  2.3*  --     CBC  Recent Labs Lab 06/08/17 2325 06/09/17 1441 06/11/17 0455  WBC 3.7 2.7* 2.5*  HGB 8.3* 8.4* 6.9*  HCT 24.8* 25.2* 20.3*  PLT 249  229 159    Coag's No results for input(s): APTT, INR in the last 168 hours.  Sepsis Markers  Recent Labs Lab 06/08/17 2325 06/09/17 1441 06/10/17 0411 06/11/17 0455  LATICACIDVEN 1.3 1.1  --   --   PROCALCITON  --  1.51 0.70 0.33    ABG No results for input(s): PHART, PCO2ART, PO2ART in the last 168 hours.  Liver Enzymes  Recent Labs Lab 06/09/17 1441 06/10/17 0011 06/10/17 0411 06/11/17 0455  AST 73* 48*  --  30  ALT 38 32  --  24  ALKPHOS 147* 127*  --  117  BILITOT 0.9 0.5  --  0.4  ALBUMIN 1.9* 1.8* 1.9* 1.6*    Cardiac Enzymes  Recent Labs Lab 06/08/17 2325  TROPONINI <0.03    Glucose  Recent Labs Lab 06/10/17 1633 06/10/17 1938 06/10/17 2335 06/11/17 0335 06/11/17 0727 06/11/17 1143  GLUCAP 165* 189* 160* 138* 110* 111*   Dg Chest Port 1 View  Result Date: 06/11/2017 CLINICAL DATA:  71 year old female respiratory failure. Breast cancer. Subsequent encounter. EXAM: PORTABLE CHEST 1 VIEW COMPARISON:  06/09/2017 chest x-ray. FINDINGS: Right central line tip mid superior vena cava level. No gross pneumothorax. Heart size within normal limits. Central pulmonary vascular prominence. Crowding of lung markings versus right base atelectasis/ infiltrate. Small left-sided pleural effusion with minimal left base atelectasis.  Calcified slightly tortuous aorta. IMPRESSION: Crowding of lung markings versus right base atelectasis/ infiltrate. Small left-sided pleural effusion with minimal left base atelectasis. Aortic Atherosclerosis (ICD10-I70.0). Electronically Signed   By: Genia Del M.D.   On: 06/11/2017 07:10    ASSESSMENT Metastatic breast cancer Recent laparotomy for gastric outlet obstruction due to breast cancer metastasis Syncope Hypotension Chronic hypocalcemia Chronic anemia Mildly elevated PCT Pleural effusion Anemia-likely secondary to malignancy; Hg down to 6.4   PLAN Transfuse 1 unit of PRBCS Oncology consult Palliative care consult  for goals of care Wean NE to off for MAP > 60 mmHg Continue ceftriaxone for now Prn morphine and Duragesic patch for pain Monitor and replace electrolytes CBC post-transfusion Calcium gluconate 1 G x 1 now GI and DVT prophylaxis  Magdalene S. [facility] ANP-BC Pulmonary and Critical Care Medicine [facility] Pager [phone] or [phone]  Merton Border, MD PCCM service Mobile [phone] Pager [phone] 06/12/2017 4:20 PM

## 2017-06-12 ENCOUNTER — Telehealth: Payer: Self-pay | Admitting: Internal Medicine

## 2017-06-12 DIAGNOSIS — Z7189 Other specified counseling: Secondary | ICD-10-CM

## 2017-06-12 DIAGNOSIS — F17210 Nicotine dependence, cigarettes, uncomplicated: Secondary | ICD-10-CM

## 2017-06-12 DIAGNOSIS — Z79899 Other long term (current) drug therapy: Secondary | ICD-10-CM

## 2017-06-12 DIAGNOSIS — Z515 Encounter for palliative care: Secondary | ICD-10-CM

## 2017-06-12 DIAGNOSIS — Z792 Long term (current) use of antibiotics: Secondary | ICD-10-CM

## 2017-06-12 DIAGNOSIS — J449 Chronic obstructive pulmonary disease, unspecified: Secondary | ICD-10-CM

## 2017-06-12 DIAGNOSIS — R531 Weakness: Secondary | ICD-10-CM

## 2017-06-12 DIAGNOSIS — M1990 Unspecified osteoarthritis, unspecified site: Secondary | ICD-10-CM

## 2017-06-12 DIAGNOSIS — Z9012 Acquired absence of left breast and nipple: Secondary | ICD-10-CM

## 2017-06-12 DIAGNOSIS — Z170 Estrogen receptor positive status [ER+]: Secondary | ICD-10-CM

## 2017-06-12 DIAGNOSIS — R6521 Severe sepsis with septic shock: Secondary | ICD-10-CM

## 2017-06-12 DIAGNOSIS — G8929 Other chronic pain: Secondary | ICD-10-CM

## 2017-06-12 DIAGNOSIS — C7989 Secondary malignant neoplasm of other specified sites: Secondary | ICD-10-CM

## 2017-06-12 DIAGNOSIS — K219 Gastro-esophageal reflux disease without esophagitis: Secondary | ICD-10-CM

## 2017-06-12 DIAGNOSIS — C50812 Malignant neoplasm of overlapping sites of left female breast: Secondary | ICD-10-CM

## 2017-06-12 DIAGNOSIS — I959 Hypotension, unspecified: Secondary | ICD-10-CM

## 2017-06-12 DIAGNOSIS — Z9884 Bariatric surgery status: Secondary | ICD-10-CM

## 2017-06-12 DIAGNOSIS — E119 Type 2 diabetes mellitus without complications: Secondary | ICD-10-CM

## 2017-06-12 DIAGNOSIS — Z7982 Long term (current) use of aspirin: Secondary | ICD-10-CM

## 2017-06-12 DIAGNOSIS — Z79818 Long term (current) use of other agents affecting estrogen receptors and estrogen levels: Secondary | ICD-10-CM

## 2017-06-12 DIAGNOSIS — M549 Dorsalgia, unspecified: Secondary | ICD-10-CM

## 2017-06-12 DIAGNOSIS — C50919 Malignant neoplasm of unspecified site of unspecified female breast: Secondary | ICD-10-CM

## 2017-06-12 DIAGNOSIS — R55 Syncope and collapse: Secondary | ICD-10-CM

## 2017-06-12 LAB — CBC
HEMATOCRIT: 24.5 % — AB (ref 35.0–47.0)
HEMOGLOBIN: 8.3 g/dL — AB (ref 12.0–16.0)
MCH: 28.6 pg (ref 26.0–34.0)
MCHC: 33.8 g/dL (ref 32.0–36.0)
MCV: 84.5 fL (ref 80.0–100.0)
Platelets: 141 10*3/uL — ABNORMAL LOW (ref 150–440)
RBC: 2.89 MIL/uL — ABNORMAL LOW (ref 3.80–5.20)
RDW: 22 % — ABNORMAL HIGH (ref 11.5–14.5)
WBC: 3 10*3/uL — ABNORMAL LOW (ref 3.6–11.0)

## 2017-06-12 LAB — BASIC METABOLIC PANEL
Anion gap: 4 — ABNORMAL LOW (ref 5–15)
BUN: 5 mg/dL — ABNORMAL LOW (ref 6–20)
CHLORIDE: 107 mmol/L (ref 101–111)
CO2: 26 mmol/L (ref 22–32)
CREATININE: 0.63 mg/dL (ref 0.44–1.00)
Calcium: 6.9 mg/dL — ABNORMAL LOW (ref 8.9–10.3)
GFR calc non Af Amer: >60 mL/min (ref >60)
Glucose, Bld: 107 mg/dL — ABNORMAL HIGH (ref 65–99)
Potassium: 4.7 mmol/L (ref 3.5–5.1)
Sodium: 137 mmol/L (ref 135–145)

## 2017-06-12 LAB — TYPE AND SCREEN
ABO/RH(D): O POS
ANTIBODY SCREEN: NEGATIVE
UNIT DIVISION: 0

## 2017-06-12 LAB — GLUCOSE, CAPILLARY
GLUCOSE-CAPILLARY: 103 mg/dL — AB (ref 65–99)
GLUCOSE-CAPILLARY: 78 mg/dL (ref 65–99)
GLUCOSE-CAPILLARY: 120 mg/dL — AB (ref 65–99)
GLUCOSE-CAPILLARY: 111 mg/dL — AB (ref 65–99)
Glucose-Capillary: 110 mg/dL — ABNORMAL HIGH (ref 65–99)
Glucose-Capillary: 112 mg/dL — ABNORMAL HIGH (ref 65–99)
Glucose-Capillary: 122 mg/dL — ABNORMAL HIGH (ref 65–99)
Glucose-Capillary: 123 mg/dL — ABNORMAL HIGH (ref 65–99)

## 2017-06-12 LAB — BPAM RBC
Blood Product Expiration Date: 201810222359
ISSUE DATE / TIME: 201810161802
Unit Type and Rh: 5100

## 2017-06-12 LAB — MAGNESIUM: Magnesium: 1.4 mg/dL — ABNORMAL LOW (ref 1.7–2.4)

## 2017-06-12 LAB — PHOSPHORUS: PHOSPHORUS: 1.7 mg/dL — AB (ref 2.5–4.6)

## 2017-06-12 MED ORDER — SODIUM CHLORIDE 0.9 % IV SOLN
2.0000 g | Freq: Once | INTRAVENOUS | Status: AC
  Administered 2017-06-12: 2 g via INTRAVENOUS
  Filled 2017-06-12: qty 20

## 2017-06-12 MED ORDER — LACTATED RINGERS IV SOLN
INTRAVENOUS | Status: DC
  Administered 2017-06-12 – 2017-06-13 (×2): via INTRAVENOUS

## 2017-06-12 MED ORDER — DEXTROSE 5 % IV SOLN
1.0000 g | INTRAVENOUS | Status: DC
  Administered 2017-06-12 – 2017-06-14 (×3): 1 g via INTRAVENOUS
  Filled 2017-06-12 (×4): qty 10

## 2017-06-12 MED ORDER — MAGNESIUM HYDROXIDE 400 MG/5ML PO SUSP
5.0000 mL | Freq: Every day | ORAL | Status: DC | PRN
  Filled 2017-06-12: qty 30

## 2017-06-12 MED ORDER — SODIUM PHOSPHATES 45 MMOLE/15ML IV SOLN
20.0000 mmol | Freq: Once | INTRAVENOUS | Status: AC
  Administered 2017-06-12: 20 mmol via INTRAVENOUS
  Filled 2017-06-12: qty 6.67

## 2017-06-12 MED ORDER — ENOXAPARIN SODIUM 40 MG/0.4ML ~~LOC~~ SOLN
40.0000 mg | SUBCUTANEOUS | Status: DC
  Administered 2017-06-12 – 2017-06-16 (×5): 40 mg via SUBCUTANEOUS
  Filled 2017-06-12 (×5): qty 0.4

## 2017-06-12 MED ORDER — SODIUM CHLORIDE 0.9 % IV SOLN
4.0000 g | Freq: Once | INTRAVENOUS | Status: AC
  Administered 2017-06-12: 4 g via INTRAVENOUS
  Filled 2017-06-12: qty 8

## 2017-06-12 MED ORDER — SODIUM CHLORIDE 0.9% FLUSH: 10.0000 mL | INTRAVENOUS | Status: DC | PRN

## 2017-06-12 MED ORDER — SODIUM CHLORIDE 0.9 % IV BOLUS (SEPSIS)
1000.0000 mL | Freq: Once | INTRAVENOUS | Status: AC
  Administered 2017-06-12: 1000 mL via INTRAVENOUS

## 2017-06-12 NOTE — Consult Note (Signed)
[facility] CONSULT NOTE  Patient Care Team: Donnie Coffin, MD as PCP - General (Family Medicine) Donnie Coffin, MD as Referring Physician (Family Medicine) Christene Lye, MD (General Surgery) Forest Gleason, MD (Oncology)  CHIEF COMPLAINTS/PURPOSE OF CONSULTATION: Breast cancer  HISTORY OF PRESENTING ILLNESS:  Sophia Nelson 71 y.o.  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 Patient received Xgeva approximately a month ago- and noted to have severe hypocalcemia. Patient was started on oral calcium supplementation at the last visit to the clinic approximately 10 days ago.  Patient is currently admitted to the hospital for generalized weakness- noted to be hypotensive thought to be from UTI from sepsis. Patient is overall improved since admission however she is needing vasopressors. She continues to be on ceftriaxone. Patient also received blood transfusion.  Oncology has been consulted for discussion regarding long-term goals of care.   ROS: A complete 10 point review of system is done which is negative except mentioned above in history of present illness  MEDICAL HISTORY:  Past Medical History:  Diagnosis Date  . Abdominal distention   . AKI (acute kidney injury) ([place]) 02/24/2017  . Anemia   . Arthritis   . Asthma   . Back pain, chronic 03/15/2017  . Breast cancer ([place]) 2009   RT MASTECTOMY, DCIS  . Cancer of intra-abdominal ([place])    Metastatic breast cancer lobular carcinoma wth recurrence in the abdomen status post resection  . Cancer of left breast ([place]) 08/09/2015   T4 N1 M1 tumor, INVASIVE LOBULAR CARCINOMA.  . Carcinoma of overlapping sites of left breast in female, estrogen receptor positive ([place]) 03/22/2016  . COPD (chronic obstructive pulmonary disease) ([place])   . Diabetes ([place]) 02/24/2017  . Diabetes mellitus without  complication ([place])   . Duodenal obstruction   . Encounter for nasogastric (NG) tube placement   . Gastric outlet obstruction 02/24/2017  . GERD (gastroesophageal reflux disease)   . Headache    h/o migraines as a child  . Hypertension   . Neck pain 04/19/2016  . Pancreatitis, acute 03/04/2017  . Pneumonia 2015  . Recurrent low back pain 03/15/2017  . Shortness of breath dyspnea    with exertion    SURGICAL HISTORY: Past Surgical History:  Procedure Laterality Date  . ABDOMINAL HYSTERECTOMY    . BREAST SURGERY Right 2015   mastectomy  . ESOPHAGOGASTRODUODENOSCOPY N/A 02/25/2017   Procedure: ESOPHAGOGASTRODUODENOSCOPY (EGD);  Surgeon: Jonathon Bellows, MD;  Location: [facility] ENDOSCOPY;  Service: Endoscopy;  Laterality: N/A;  . ESOPHAGOGASTRODUODENOSCOPY (EGD) WITH PROPOFOL N/A 03/06/2017   Procedure: ESOPHAGOGASTRODUODENOSCOPY (EGD) WITH PROPOFOL;  Surgeon: [name], MD;  Location: [facility] ENDOSCOPY;  Service: Endoscopy;  Laterality: N/A;  . GASTROJEJUNOSTOMY N/A 03/08/2017   Procedure: Roux-en-Y gastrojejunostomy Bypass of Gastric Outlet Obstruction, small bowel resection;  Surgeon: Vickie Epley, MD;  Location: [facility] ORS;  Service: General;  Laterality: N/A;  . SIMPLE MASTECTOMY WITH AXILLARY SENTINEL NODE BIOPSY Left 02/29/2016   Procedure: SIMPLE MASTECTOMY;  Surgeon: Christene Lye, MD;  Location: [facility] ORS;  Service: General;  Laterality: Left;  . TUBAL LIGATION      SOCIAL HISTORY: Social History   Social History  . Marital status: Divorced    Spouse name: N/A  . Number of children: N/A  . Years of education: N/A   Occupational History  . Not on file.   Social History Main Topics  .  Smoking status: Current Some Day Smoker    Packs/day: 0.25    Years: 15.00    Types: Cigarettes    Last attempt to quit: 02/25/2016  . Smokeless tobacco: Never Used     Comment: currently as of 02-21-16 pt states she is only smoking 2-3 cigarettes per day  . 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 Drug use: No  . Sexual activity: Not on file   Other Topics Concern  . Not on file   Social History Narrative  . No narrative on file    FAMILY HISTORY: Family History  Problem Relation Age of Onset  . Lung cancer Father   . Breast cancer Neg Hx     ALLERGIES:  is allergic to other.  MEDICATIONS:  Current Facility-Administered Medications  Medication Dose Route Frequency Provider Last Rate Last Dose  . acetaminophen (TYLENOL) tablet 650 mg  650 mg Oral Q6H PRN Hugelmeyer, Alexis, DO   650 mg at 06/09/17 0526  . albuterol (PROVENTIL) (2.5 MG/3ML) 0.083% nebulizer solution 2.5 mg  2.5 mg Nebulization Q6H PRN Hugelmeyer, Alexis, DO      . aspirin EC tablet 81 mg  81 mg Oral Daily Hugelmeyer, Alexis, DO   81 mg at 06/12/17 1119  . atorvastatin (LIPITOR) tablet 10 mg  10 mg Oral Daily Hugelmeyer, Alexis, DO   10 mg at 06/11/17 1826  . bisacodyl (DULCOLAX) EC tablet 5 mg  5 mg Oral Daily PRN Hugelmeyer, Alexis, DO   5 mg at 06/09/17 0526  . calcium gluconate 2 g in sodium chloride 0.9 % 100 mL IVPB  2 g Intravenous Once Charlaine Dalton R, MD      . cefTRIAXone (ROCEPHIN) 1 g in dextrose 5 % 50 mL IVPB  1 g Intravenous Q24H Sudini, Srikar, MD      . docusate sodium (COLACE) capsule 100 mg  100 mg Oral BID Hugelmeyer, Alexis, DO   100 mg at 06/12/17 1119  . enoxaparin (LOVENOX) injection 40 mg  40 mg Subcutaneous Q24H Wilhelmina Mcardle, MD      . feeding supplement (ENSURE ENLIVE) (ENSURE ENLIVE) liquid 237 mL  237 mL Oral TID BM Hugelmeyer, Alexis, DO   237 mL at 06/12/17 1400  . fentaNYL (DURAGESIC - dosed mcg/hr) patch 25 mcg  25 mcg Transdermal Q72H Wilhelmina Mcardle, MD   25 mcg at 06/11/17 1110  . guaiFENesin (MUCINEX) 12 hr tablet 600 mg  600 mg Oral BID PRN Hugelmeyer, Alexis, DO      . HYDROcodone-acetaminophen (NORCO/VICODIN) 5-325 MG per tablet 1-2 tablet  1-2 tablet Oral  Q4H PRN Hugelmeyer, Alexis, DO   2 tablet at 06/09/17 2008  . lactated ringers infusion   Intravenous Continuous Awilda Bill, NP 50 mL/hr at 06/12/17 1600    . loperamide (IMODIUM) capsule 2 mg  2 mg Oral PRN Hugelmeyer, Alexis, DO      . loratadine (CLARITIN) tablet 10 mg  10 mg Oral Daily Hugelmeyer, Alexis, DO   10 mg at 06/12/17 1119  . magnesium hydroxide (MILK OF MAGNESIA) suspension 5 mL  5 mL Oral Daily PRN Awilda Bill, NP      . magnesium sulfate 4 g in sodium chloride 0.9 % 250 mL  4 g Intravenous Once Awilda Bill, NP 43 mL/hr at 06/12/17 1200 4 g at 06/12/17 1200  . mometasone-formoterol (  DULERA) 200-5 MCG/ACT inhaler 2 puff  2 puff Inhalation BID Hugelmeyer, Alexis, DO   2 puff at 06/12/17 0813  . morphine 2 MG/ML injection 1-2 mg  1-2 mg Intravenous Q4H PRN Awilda Bill, NP   2 mg at 06/10/17 2226  . norepinephrine (LEVOPHED) 16 mg in dextrose 5 % 250 mL (0.064 mg/mL) infusion  0-40 mcg/min Intravenous Titrated Wilhelmina Mcardle, MD 1.9 mL/hr at 06/12/17 1631 2 mcg/min at 06/12/17 1631  . ondansetron (ZOFRAN) tablet 4 mg  4 mg Oral Q6H PRN Hugelmeyer, Alexis, DO       Or  . ondansetron (ZOFRAN) injection 4 mg  4 mg Intravenous Q6H PRN Hugelmeyer, Alexis, DO   4 mg at 06/12/17 1428  . oxyCODONE-acetaminophen (PERCOCET/ROXICET) 5-325 MG per tablet 1 tablet  1 tablet Oral Q8H PRN Hugelmeyer, Alexis, DO      . pantoprazole (PROTONIX) EC tablet 40 mg  40 mg Oral Daily Hugelmeyer, Alexis, DO   40 mg at 06/12/17 1122  . senna-docusate (Senokot-S) tablet 1 tablet  1 tablet Oral QHS PRN Hugelmeyer, Alexis, DO      . sodium chloride flush (NS) 0.9 % injection 3 mL  3 mL Intravenous Q12H Hugelmeyer, Alexis, DO   3 mL at 06/12/17 1121  . sodium phosphate 20 mmol in dextrose 5 % 250 mL infusion  20 mmol Intravenous Once Awilda Bill, NP 43 mL/hr at 06/12/17 1200 20 mmol at 06/12/17 1200  . tiotropium [facility]) inhalation capsule 18 mcg  18 mcg Inhalation BH-q7a Hugelmeyer,  Alexis, DO   18 mcg at 06/12/17 0636  . tiZANidine (ZANAFLEX) tablet 2 mg  2 mg Oral BID Hugelmeyer, Alexis, DO   2 mg at 06/12/17 1119      .  PHYSICAL EXAMINATION:  Vitals:   06/12/17 1500 06/12/17 1600  BP: (!) 62/45 (!) 84/53  Pulse: 90 80  Resp: (!) 22 18  Temp:    SpO2: 95% 100%   Filed Weights   06/08/17 2257 06/09/17 0344  Weight: 128 lb (58.1 kg) 137 lb 9.1 oz (62.4 kg)    GENERAL: Well-nourished well-developed; Alert, no distress and comfortable.   Alone. EYES: no pallor or icterus OROPHARYNX: no thrush or ulceration. NECK: supple, no masses felt LYMPH:  no palpable lymphadenopathy in the cervical, axillary or inguinal regions LUNGS: decreased breath sounds to auscultation at bases and  No wheeze or crackles HEART/CVS: regular rate & rhythm and no murmurs; No lower extremity edema ABDOMEN: abdomen soft, non-tender and normal bowel sounds Musculoskeletal:no cyanosis of digits and no clubbing  PSYCH: alert & oriented x 3 with fluent speech NEURO: no focal motor/sensory deficits SKIN:  no rashes or significant lesions  LABORATORY DATA:  I have reviewed the data as listed Lab Results  Component Value Date   WBC 3.0 (L) 06/12/2017   HGB 8.3 (L) 06/12/2017   HCT 24.5 (L) 06/12/2017   MCV 84.5 06/12/2017   PLT 141 (L) 06/12/2017    Recent Labs  03/03/17 2140  03/06/17 1808  06/09/17 1441 06/10/17 0011 06/10/17 0411 06/11/17 0455 06/12/17 0434  NA 137  < >  --   < > 134* 135 136 135 137  K 4.0  < >  --   < > 3.0* 3.5 3.4* 3.9 4.7  CL 91*  < >  --   < > 106 107 107 105 107  CO2 32  < >  --   < > 22 22 24 26 26   GLUCOSE  133*  < >  --   < > 168* 136* 101* 136* 107*  BUN 45*  < >  --   < > 16 10 9  <5* 5*  CREATININE 1.49*  < >  --   < > 1.59* 0.97 0.82 0.62 0.63  CALCIUM 9.0  < >  --   < > 5.0*  4.9 6.6* 6.3* 6.4* 6.9*  GFRNONAA 35*  < >  --   < > 32* 58* >60 >60 >60  GFRAA 40*  < >  --   < > 37* >60 >60 >60 >60  PROT 6.7  --  6.6  < > 5.0* 5.0*  --   4.8*  --   ALBUMIN 3.4*  --  3.1*  < > 1.9* 1.8* 1.9* 1.6*  --   AST 23  --  21  < > 73* 48*  --  30  --   ALT 11*  --  12*  < > 38 32  --  24  --   ALKPHOS 70  --  67  < > 147* 127*  --  117  --   BILITOT 1.0  --  0.7  < > 0.9 0.5  --  0.4  --   BILIDIR 0.1  --  <0.1*  --   --   --   --   --   --   IBILI 0.9  --  NOT CALCULATED  --   --   --   --   --   --   < > = values in this interval not displayed.  RADIOGRAPHIC STUDIES: I have personally reviewed the radiological images as listed and agreed with the findings in the report. Dg Abd 1 View  Result Date: 06/09/2017 CLINICAL DATA:  Acute onset of generalized abdominal pain. Initial encounter. EXAM: ABDOMEN - 1 VIEW COMPARISON:  Abdominal radiograph performed 03/07/2017 FINDINGS: The visualized bowel gas pattern is unremarkable. Scattered air filled loops of colon are seen; no abnormal dilatation of small bowel loops is seen to suggest small bowel obstruction. No free intra-abdominal air is identified, though evaluation for free air is limited on a single supine view. The visualized osseous structures are within normal limits; the sacroiliac joints are unremarkable in appearance. The visualized lung bases are essentially clear. IMPRESSION: Unremarkable bowel gas pattern; no free intra-abdominal air seen. Electronically Signed   By: Garald Balding M.D.   On: 06/09/2017 05:31   Ct Head Wo Contrast  Result Date: 06/09/2017 CLINICAL DATA:  Initial evaluation for acute trauma, fall. EXAM: CT HEAD WITHOUT CONTRAST TECHNIQUE: Contiguous axial images were obtained from the base of the skull through the vertex without intravenous contrast. COMPARISON:  Prior CT from 11/02/2016. FINDINGS: Brain: Cerebral volume within normal limits for patient age. No evidence for acute intracranial hemorrhage. No findings to suggest acute large vessel territory infarct. No mass lesion, midline shift, or mass effect. Ventricles are normal in size without evidence for  hydrocephalus. No extra-axial fluid collection identified. Vascular: No hyperdense vessel identified. Skull: Scalp soft tissues demonstrate no acute abnormality.Calvarium intact. Focal exostosis extending from the left occipital calvarium again noted. Sinuses/Orbits: Globes and orbital soft tissues are within normal limits. Visualized paranasal sinuses are clear. No mastoid effusion. IMPRESSION: Negative head CT.  No acute intracranial abnormality identified. Electronically Signed   By: Jeannine Boga M.D.   On: 06/09/2017 00:35   Dg Chest Port 1 View  Result Date: 06/11/2017 CLINICAL DATA:  71 year old female respiratory failure.  Breast cancer. Subsequent encounter. EXAM: PORTABLE CHEST 1 VIEW COMPARISON:  06/09/2017 chest x-ray. FINDINGS: Right central line tip mid superior vena cava level. No gross pneumothorax. Heart size within normal limits. Central pulmonary vascular prominence. Crowding of lung markings versus right base atelectasis/ infiltrate. Small left-sided pleural effusion with minimal left base atelectasis. Calcified slightly tortuous aorta. IMPRESSION: Crowding of lung markings versus right base atelectasis/ infiltrate. Small left-sided pleural effusion with minimal left base atelectasis. Aortic Atherosclerosis (ICD10-I70.0). Electronically Signed   By: Genia Del M.D.   On: 06/11/2017 07:10   Dg Chest Port 1 View  Result Date: 06/09/2017 CLINICAL DATA:  Line placement. EXAM: PORTABLE CHEST 1 VIEW COMPARISON:  03/06/2017 FINDINGS: Right internal jugular approach central venous catheter with tip at the expected location of the distal superior vena cava. Cardiomediastinal silhouette is normal. Mediastinal contours appear intact. Calcific atherosclerotic disease of the aorta. There is no evidence of pneumothorax. Left pleural effusion and basilar atelectasis. Osseous structures are without acute abnormality. Soft tissues are grossly normal. IMPRESSION: Right internal jugular approach  central venous catheter tip at the expected location of the distal superior vena cava. Persistent left pleural effusion and subsegmental atelectasis. Electronically Signed   By: Fidela Salisbury M.D.   On: 06/09/2017 19:21    ASSESSMENT & PLAN:  71 year old female patient with a history of metastatic lobular cancer currently admitted to hospital for sepsis/hypotension  # Metastatic breast cancer lobular- with involvement of the likely proximal small bowel [not very evident on the CT scans; but noted intraoperatively]. On Faslodex plus abema. Patient has so for not received chemotherapy at all. So patient definitely has chemotherapy options down the line.   # Multiple electrolyte abnormalities including-hypocalcemia/hypomagnesemia hypophosphatemia- suspect poor nutrition; and also Xgeva. Continue correction of the electrolyte abnormalities.  # Hypotension/sepsis-question UTI - on vasopressors.. Antibiotics as per primary team.  # Prognosis: Overall poor given her metastatic disease; Patient understands that this is incurable cancer.However, in general the median survival of metastatic lobular cancer is in the order of 3-5 years. Patient is currently on second line therapy. Patient's has multiple chemotherapy options available. However this has to be weighed in the context of her clinical condition/performance status etc. For now I would recommend continued care. All questions were answered. The above plan of care was discussed with Dr. Alva Garnet.    Thank you Dr.Simonds for allowing me to participate in the care of your pleasant patient. Please do not hesitate to contact me with questions or concerns in the interim.    [name], MD 06/12/2017 5:39 PM

## 2017-06-12 NOTE — Progress Notes (Signed)
Patient was sitting up in chair talking with Pallative care NP Jinny Blossom), when she asked RN to give patient some nausea medicine. PRN nausea medication given. Patient reported feeling dizzy after nausea medication given and RN recheck BP even though had only been 9 minutes since last BP of 104/69, MAP 80. New BP 57/45, MAP 51 with patient's arm still and cuff in good position. Rechecked BP and new BP 62/48 with MAP 54. Spoke with Marda Stalker NP about patient's BP and symptoms and whether she wanted RN to restart levophed infusion (which had been off since 09:00) or try fluid bolus. NP ordered 1 L fluid bolus and stated to restart levophed if that did not bring BP back to goals. This RN began fluid bolus administration. Patient allowed RN to put up feet of chair and lean chair back (had initially refused). Notified Psychologist, prison and probation services (patient's primary RN). Team will continue to monitor.

## 2017-06-12 NOTE — Progress Notes (Signed)
Physical Therapy Treatment Patient Details Name: Sophia Nelson MRN: [id number] DOB: 01/21/1946 Today's Date: 06/12/2017    History of Present Illness Pt is a 71 y.o. F with a PMH including AKI, anemia, breast cancer, COPD, DM, GERD, headaches, HTN, and SOB dyspnea. Presents to ED on 10/13 s/p fall with LOC secondary to hypoglycemia, hypotension; admitted with sepsis secondary to UTI. Imaging negative for any acute abnormalties.    PT Comments    Pt able to perform bed exercises; bed mobility with min guard; sit to stand required min assist for standing d/t pt reported legs feeling shaky in standing; standing balance and gait (2ft with RW) required min assist. BP monitored throughout session; 121/59 beginning of session, 114/66 seated EOB, 110/60 after transfer to chair; no c/o dizziness. Next session plan to progress transfers and gait as appropriate.  Follow Up Recommendations  SNF     Equipment Recommendations  Rolling walker with 5" wheels    Recommendations for Other Services       Precautions / Restrictions Precautions Precautions: Fall Restrictions Weight Bearing Restrictions: No    Mobility  Bed Mobility Overal bed mobility: Needs Assistance Bed Mobility: Supine to Sit     Supine to sit: Min guard     General bed mobility comments: Min guard for safety; vc's for sequencing, railing use; increased time and effort to perform  Transfers Overall transfer level: Needs assistance Equipment used: Rolling walker (2 wheeled) Transfers: Sit to/from Stand Sit to Stand: Min assist         General transfer comment: Sit to stand x2; first trial, pt reports legs feeling shaky and had to sit back down; min assist for sit to stand, vc's for sequencing  Ambulation/Gait Ambulation/Gait assistance: Min assist Ambulation Distance (Feet): 3 Feet Assistive device: Rolling walker (2 wheeled)       General Gait Details: small shuffling steps   Stairs             Wheelchair Mobility    Modified Rankin (Stroke Patients Only)       Balance Overall balance assessment: Needs assistance Sitting-balance support: Bilateral upper extremity supported Sitting balance-Leahy Scale: Fair Sitting balance - Comments: able to perform light AROM exercises seated EOB with BUE supported   Standing balance support: Bilateral upper extremity supported Standing balance-Leahy Scale: Poor Standing balance comment: BUE supported on RW, min assist for standing balance                            Cognition Arousal/Alertness: Awake/alert Behavior During Therapy: WFL for tasks assessed/performed Overall Cognitive Status: Within Functional Limits for tasks assessed                                        Exercises Total Joint Exercises Ankle Circles/Pumps: AROM;Both;15 reps;Supine Quad Sets: Strengthening;Both;10 reps;Supine Short Arc Quad: AROM;Both;10 reps;Seated Hip ABduction/ADduction: Both;AAROM;10 reps    General Comments        Pertinent Vitals/Pain Pain Assessment: Faces Faces Pain Scale: Hurts a little bit Pain Location: BLE pain with activity Pain Descriptors / Indicators: Grimacing;Discomfort Pain Intervention(s): Limited activity within patient's tolerance;Monitored during session    Home Living                      Prior Function            PT  Goals (current goals can now be found in the care plan section) Acute Rehab PT Goals Patient Stated Goal: to be able to go home PT Goal Formulation: With patient Time For Goal Achievement: 06/24/17 Potential to Achieve Goals: Fair Progress towards PT goals: Progressing toward goals    Frequency    Min 2X/week      PT Plan Current plan remains appropriate    Co-evaluation              AM-PAC PT "6 Clicks" Daily Activity  Outcome Measure  Difficulty turning over in bed (including adjusting bedclothes, sheets and blankets)?: A  Little Difficulty moving from lying on back to sitting on the side of the bed? : A Little Difficulty sitting down on and standing up from a chair with arms (e.g., wheelchair, bedside commode, etc,.)?: Unable Help needed moving to and from a bed to chair (including a wheelchair)?: A Lot Help needed walking in hospital room?: A Lot Help needed climbing 3-5 steps with a railing? : A Lot 6 Click Score: 13    End of Session Equipment Utilized During Treatment: Gait belt;Oxygen Activity Tolerance: Patient tolerated treatment well Patient left: in chair;with call bell/phone within reach (heels elevated) Nurse Communication: Mobility status PT Visit Diagnosis: Muscle weakness (generalized) (M62.81);Other abnormalities of gait and mobility (R26.89)     Time: 1610-9604 PT Time Calculation (min) (ACUTE ONLY): 31 min  Charges:                       G CodesWetzel Bjornstad, SPT 06/12/2017, 1:21 PM

## 2017-06-12 NOTE — Progress Notes (Signed)
PULMONARY / CRITICAL CARE MEDICINE   Name: Sophia Nelson MRN: [id number] DOB: 1945-09-25    ADMISSION DATE:  06/08/2017  PT PROFILE:   56 F with history of metastatic breast cancer, DM admitted via ED with syncope, multiple electrolyte abnormalities, hypotension. CT head 10/14: no acute findings  MAJOR EVENTS/TEST RESULTS: 10/13 Admission 10/14 CT head: NAD 10/15 Weaning vasopressors 10/16 Anemic, transfused 1 unit of PRBCs; oncology consulted  INDWELLING DEVICES: None   MICRO DATA: MRSA PCR 0/14 >> NEG Urine 10/13 >> NEG  ANTIMICROBIALS:  Metronidazole 10/14 >> 10/15 Ceftriaxone 10/14 >>   SUBJECTIVE:  No complaints at this time she is asking can she have something for a bowel movement.   VITAL SIGNS: BP (!) 94/54   Pulse 84   Temp 98.1 F (36.7 C)   Resp 18   Ht 5' 1.5" (1.562 m)   Wt 62.4 kg (137 lb 9.1 oz)   SpO2 100%   BMI 25.57 kg/m   HEMODYNAMICS:    VENTILATOR SETTINGS:    INTAKE / OUTPUT: I/O last 3 completed shifts: In: 2429.4 [I.V.:1849.4; Blood:580] Out: 3950 [Urine:3950]  PHYSICAL EXAMINATION: General: frail appearing, NAD Neuro: AAO X3, no focal deficits HEENT: NCAT, sclerae white Cardiovascular: nsr, s1s2, no M/R/G Lungs: clear anteriorly, diminished in the bases Abdomen: hypoactive BS x4, soft, non tender, mildly distended  Ext: cool, no edema  LABS:  BMET  Recent Labs Lab 06/10/17 0411 06/11/17 0455 06/12/17 0434  NA 136 135 137  K 3.4* 3.9 4.7  CL 107 105 107  CO2 24 26 26   BUN 9 <5* 5*  CREATININE 0.82 0.62 0.63  GLUCOSE 101* 136* 107*    Electrolytes  Recent Labs Lab 06/09/17 1441  06/10/17 0411 06/11/17 0455 06/12/17 0434  CALCIUM 5.0*  4.9  < > 6.3* 6.4* 6.9*  MG 1.8  --  1.7  --  1.4*  PHOS <1.0*  --  2.3*  --  1.7*  < > = values in this interval not displayed.  CBC  Recent Labs Lab 06/09/17 1441 06/11/17 0455 06/12/17 0434  WBC 2.7* 2.5* 3.0*  HGB 8.4* 6.9* 8.3*  HCT 25.2* 20.3* 24.5*  PLT  229 159 141*    Coag's No results for input(s): APTT, INR in the last 168 hours.  Sepsis Markers  Recent Labs Lab 06/08/17 2325 06/09/17 1441 06/10/17 0411 06/11/17 0455  LATICACIDVEN 1.3 1.1  --   --   PROCALCITON  --  1.51 0.70 0.33    ABG No results for input(s): PHART, PCO2ART, PO2ART in the last 168 hours.  Liver Enzymes  Recent Labs Lab 06/09/17 1441 06/10/17 0011 06/10/17 0411 06/11/17 0455  AST 73* 48*  --  30  ALT 38 32  --  24  ALKPHOS 147* 127*  --  117  BILITOT 0.9 0.5  --  0.4  ALBUMIN 1.9* 1.8* 1.9* 1.6*    Cardiac Enzymes  Recent Labs Lab 06/08/17 2325  TROPONINI <0.03    Glucose  Recent Labs Lab 06/11/17 1143 06/11/17 1629 06/11/17 1938 06/12/17 0007 06/12/17 0439 06/12/17 0738  GLUCAP 111* 110* 101* 110* 112* 103*   No results found.  ASSESSMENT Metastatic breast cancer Recent laparotomy for gastric outlet obstruction due to breast cancer metastasis Syncope Hypotension Chronic hypocalcemia Hypomagnesia  Chronic anemia Mildly elevated PCT-improved  Pleural effusion Anemia-likely secondary to malignancy-improved    PLAN Oncology and Palliative consulted appreciate input  Chemotherapy per oncology recommendations  Maintain map >60  Ceftriaxone dose increased-10/17 Trend  BMP  Replace electrolytes as indicated  Monitor UOP Discontinue foley  Trend CBC Monitor for s/sx of bleeding  Transfuse for hgb <7 Lovenox for VTE prophylaxis-if hgb continues to drop will discontinue   Prn morphine and duragesic patch for pain management  PT consult placed-frequent OOB to chair  Prn milk of magnesium for constipation  If bp remains stable off of vasopressors will transfer out of ICU later today 06/12/17 and sign off once a bed becomes available.   Marda Stalker, [place] Pager [phone] (please enter 7 digits) PCCM Consult Pager [phone] (please enter 7 digits)  PCCM ATTENDING ATTESTATION: I have  evaluated patient with the APP [place], reviewed database in its entirety and discussed care plan in detail. In addition, this patient was discussed on multidisciplinary rounds. I agree with the above findings, assessment and plan. She is presently off of vasopressors. If she can remain off of vasopressors overnight, she may be transferred to [place] floor in a.m. 10/18. It is puzzling that her blood cultures were not obtained and that the urine culture is negative. However, the urinalysis on admission is fairly convincing for a urinary tract infection. Continue ceftriaxone.    Merton Border, MD PCCM service Mobile [phone] Pager [phone] 06/12/2017 4:21 PM

## 2017-06-12 NOTE — Progress Notes (Signed)
[name]    MR#:  [id number]  DATE OF BIRTH:  22-Jun-1946  SUBJECTIVE:  CHIEF COMPLAINT:   Chief Complaint  Patient presents with  . Loss of Consciousness    unwitnessed - found on floor  . Hypoglycemia   Feels weak.  NO SOB Wears O2  REVIEW OF SYSTEMS:    Review of Systems  Constitutional: Positive for malaise/fatigue. Negative for chills and fever.  HENT: Negative for sore throat.   Eyes: Negative for blurred vision, double vision and pain.  Respiratory: Negative for cough, hemoptysis, shortness of breath and wheezing.   Cardiovascular: Negative for chest pain, palpitations, orthopnea and leg swelling.  Gastrointestinal: Negative for abdominal pain, constipation, diarrhea, heartburn, nausea and vomiting.  Genitourinary: Negative for dysuria and hematuria.  Musculoskeletal: Negative for back pain and joint pain.  Skin: Negative for rash.  Neurological: Positive for weakness. Negative for sensory change, speech change, focal weakness and headaches.  Endo/Heme/Allergies: Does not bruise/bleed easily.  Psychiatric/Behavioral: Negative for depression. The patient is not nervous/anxious.     DRUG ALLERGIES:   Allergies  Allergen Reactions  . Other     Onions(that grow in the yard-pt can eat onions without problems) and dust mite Sneezing, cough, runny nose    VITALS:  Blood pressure (!) 104/54, pulse 81, temperature 98.1 F (36.7 C), resp. rate 16, height 5' 1.5" (1.562 m), weight 62.4 kg (137 lb 9.1 oz), SpO2 100 %.  PHYSICAL EXAMINATION:   Physical Exam  GENERAL:  71 y.o.-year-old patient lying in the bed with no acute distress.  EYES: Pupils equal, round, reactive to light and accommodation. No scleral icterus. Extraocular muscles intact.  HEENT: Head atraumatic, normocephalic. Oropharynx and nasopharynx clear.  NECK:  Supple, no jugular venous distention. No thyroid enlargement, no  tenderness.  LUNGS: Normal breath sounds bilaterally, no wheezing, rales, rhonchi. No use of accessory muscles of respiration.  CARDIOVASCULAR: S1, S2 normal. No murmurs, rubs, or gallops.  ABDOMEN: Soft, nontender, nondistended. Bowel sounds present. No organomegaly or mass.  EXTREMITIES: No cyanosis, clubbing or edema b/l.    NEUROLOGIC: Cranial nerves II through XII are intact. No focal Motor or sensory deficits b/l.   PSYCHIATRIC: The patient is alert and awake SKIN: No obvious rash, lesion, or ulcer.   LABORATORY PANEL:   CBC  Recent Labs Lab 06/12/17 0434  WBC 3.0*  HGB 8.3*  HCT 24.5*  PLT 141*   ------------------------------------------------------------------------------------------------------------------ Chemistries   Recent Labs Lab 06/11/17 0455 06/12/17 0434  NA 135 137  K 3.9 4.7  CL 105 107  CO2 26 26  GLUCOSE 136* 107*  BUN <5* 5*  CREATININE 0.62 0.63  CALCIUM 6.4* 6.9*  MG  --  1.4*  AST 30  --   ALT 24  --   ALKPHOS 117  --   BILITOT 0.4  --    ------------------------------------------------------------------------------------------------------------------  Cardiac Enzymes  Recent Labs Lab 06/08/17 2325  TROPONINI <0.03   ------------------------------------------------------------------------------------------------------------------  RADIOLOGY:  Dg Chest Port 1 View  Result Date: 06/11/2017 CLINICAL DATA:  71 year old female respiratory failure. Breast cancer. Subsequent encounter. EXAM: PORTABLE CHEST 1 VIEW COMPARISON:  06/09/2017 chest x-ray. FINDINGS: Right central line tip mid superior vena cava level. No gross pneumothorax. Heart size within normal limits. Central pulmonary vascular prominence. Crowding of lung markings versus right base atelectasis/ infiltrate. Small left-sided pleural effusion with minimal left base atelectasis. Calcified slightly tortuous aorta. IMPRESSION: Crowding of lung markings versus  right base  atelectasis/ infiltrate. Small left-sided pleural effusion with minimal left base atelectasis. Aortic Atherosclerosis (ICD10-I70.0). Electronically Signed   By: Genia Del M.D.   On: 06/11/2017 07:10   ASSESSMENT AND PLAN:   This is a 71 y.o. female with a history of bilateral breast cancer with bone metastasesstatus post mastectomy, diabetes, GERD, hypertension, home O2 dependent COPD, chronic hypocalcemia now being admitted with:  # septic shock secondary to UTI On IV antibiotics. Still on Levophed CX negative  #. AKI  due to septic shock/ATN Resolved  #. Diabetes with hypoglycemia - resolved - Accuchecks  - Hold glipizide  #. History of breast cancer - Continue abemaciclib  #. History of hyperlipidemia - Continue lipitor  #. History of asthma - Continue Flonase, Singulair, Advair, Spiriva  #. History of GERD - Continue Prilosec  Oncology and palliative care consulted   All the records are reviewed and case discussed with Care Management/Social Workerr. Management plans discussed with the patient, family and they are in agreement.  CODE STATUS: FULL CODE  DVT Prophylaxis: SCDs  TOTAL TIME TAKING CARE OF THIS PATIENT: 35 minutes.   POSSIBLE D/C IN 2-3 DAYS, DEPENDING ON CLINICAL CONDITION.  Sophia Nelson R M.D on 06/12/2017 at 10:18 AM  Between 7am to 6pm - Pager - [phone]  After 6pm go to [email or web address] - password [email or web address] [facility]  Office  [phone]  CC: Primary care physician; Donnie Coffin, MD  Note: This dictation was prepared with Dragon dictation along with smaller phrase technology. Any transcriptional errors that result from this process are unintentional.

## 2017-06-12 NOTE — Progress Notes (Signed)
[place] at [place] NAME: Sophia Nelson    MR#:  [id number]  DATE OF BIRTH:  1945/09/11  SUBJECTIVE:  CHIEF COMPLAINT:   Chief Complaint  Patient presents with  . Loss of Consciousness    unwitnessed - found on floor  . Hypoglycemia   Still on levophed. Hypotensive  Her blood pressures from outpatient office reviewed and SBP 110-130  REVIEW OF SYSTEMS:    Review of Systems  Constitutional: Positive for malaise/fatigue. Negative for chills and fever.  HENT: Negative for sore throat.   Eyes: Negative for blurred vision, double vision and pain.  Respiratory: Negative for cough, hemoptysis, shortness of breath and wheezing.   Cardiovascular: Negative for chest pain, palpitations, orthopnea and leg swelling.  Gastrointestinal: Negative for abdominal pain, constipation, diarrhea, heartburn, nausea and vomiting.  Genitourinary: Negative for dysuria and hematuria.  Musculoskeletal: Negative for back pain and joint pain.  Skin: Negative for rash.  Neurological: Positive for weakness. Negative for sensory change, speech change, focal weakness and headaches.  Endo/Heme/Allergies: Does not bruise/bleed easily.  Psychiatric/Behavioral: Negative for depression. The patient is not nervous/anxious.     DRUG ALLERGIES:   Allergies  Allergen Reactions  . Other     Onions(that grow in the yard-pt can eat onions without problems) and dust mite Sneezing, cough, runny nose    VITALS:  Blood pressure (!) 104/54, pulse 81, temperature 98.1 F (36.7 C), resp. rate 16, height 5' 1.5" (1.562 m), weight 62.4 kg (137 lb 9.1 oz), SpO2 100 %.  PHYSICAL EXAMINATION:   Physical Exam  GENERAL:  71 y.o.-year-old patient lying in the bed with no acute distress.  EYES: Pupils equal, round, reactive to light and accommodation. No scleral icterus. Extraocular muscles intact.  HEENT: Head atraumatic, normocephalic. Oropharynx and nasopharynx clear.  NECK:   Supple, no jugular venous distention. No thyroid enlargement, no tenderness.  LUNGS: Normal breath sounds bilaterally, no wheezing, rales, rhonchi. No use of accessory muscles of respiration.  CARDIOVASCULAR: S1, S2 normal. No murmurs, rubs, or gallops.  ABDOMEN: Soft, nontender, nondistended. Bowel sounds present. No organomegaly or mass.  EXTREMITIES: No cyanosis, clubbing or edema b/l.    NEUROLOGIC: Cranial nerves II through XII are intact. No focal Motor or sensory deficits b/l.   PSYCHIATRIC: The patient is alert and awake SKIN: No obvious rash, lesion, or ulcer.   LABORATORY PANEL:   CBC  Recent Labs Lab 06/12/17 0434  WBC 3.0*  HGB 8.3*  HCT 24.5*  PLT 141*   ------------------------------------------------------------------------------------------------------------------ Chemistries   Recent Labs Lab 06/11/17 0455 06/12/17 0434  NA 135 137  K 3.9 4.7  CL 105 107  CO2 26 26  GLUCOSE 136* 107*  BUN <5* 5*  CREATININE 0.62 0.63  CALCIUM 6.4* 6.9*  MG  --  1.4*  AST 30  --   ALT 24  --   ALKPHOS 117  --   BILITOT 0.4  --    ------------------------------------------------------------------------------------------------------------------  Cardiac Enzymes  Recent Labs Lab 06/08/17 2325  TROPONINI <0.03   ------------------------------------------------------------------------------------------------------------------  RADIOLOGY:  Dg Chest Port 1 View  Result Date: 06/11/2017 CLINICAL DATA:  71 year old female respiratory failure. Breast cancer. Subsequent encounter. EXAM: PORTABLE CHEST 1 VIEW COMPARISON:  06/09/2017 chest x-ray. FINDINGS: Right central line tip mid superior vena cava level. No gross pneumothorax. Heart size within normal limits. Central pulmonary vascular prominence. Crowding of lung markings versus right base atelectasis/ infiltrate. Small left-sided pleural effusion with minimal left base atelectasis. Calcified slightly  tortuous aorta.  IMPRESSION: Crowding of lung markings versus right base atelectasis/ infiltrate. Small left-sided pleural effusion with minimal left base atelectasis. Aortic Atherosclerosis (ICD10-I70.0). Electronically Signed   By: Genia Del M.D.   On: 06/11/2017 07:10   ASSESSMENT AND PLAN:   This is a 71 y.o. female with a history of bilateral breast cancer with bone metastasesstatus post mastectomy, diabetes, GERD, hypertension, home O2 dependent COPD, chronic hypocalcemia now being admitted with:  # septic shock secondary to UTI.  U cx Negative. Other sources? On IV antibiotics. Still on Levophed  #. AKI  due to septic shock/ATN Resolved  #. Diabetes with hypoglycemia - resolved - Accuchecks  - Hold glipizide  #. History of breast cancer - Continue abemaciclib  #. History of asthma with chronic resp failure - Continue Flonase, Singulair, Advair, Spiriva  # Anemia of chronic disease 1 unit PRBC transfused 06/11/2017 Improved   All the records are reviewed and case discussed with Care Management/Social Workerr. Management plans discussed with the patient, family and they are in agreement.  CODE STATUS: FULL CODE  DVT Prophylaxis: SCDs  TOTAL TIME TAKING CARE OF THIS PATIENT: 35 minutes.   Hillary Bow R M.D on 06/12/2017 at 10:19 AM  Between 7am to 6pm - Pager - [phone]  After 6pm go to [email or web address] - password [email or web address] [facility]  Office  [phone]  CC: Primary care physician; Donnie Coffin, MD  Note: This dictation was prepared with Dragon dictation along with smaller phrase technology. Any transcriptional errors that result from this process are unintentional.

## 2017-06-12 NOTE — Consult Note (Signed)
Consultation Note Date: 06/12/2017   Patient Name: Sophia Nelson  DOB: [birth year]  MRN: [id number]  Age / Sex: 71 y.o., female  PCP: Donnie Coffin, MD Referring Physician: Hillary Bow, MD  Reason for Consultation: Establishing goals of care  HPI/Patient Profile: 71 y.o. female  with past medical history of metastatic breast cancer s/p bilateral mastectomy, COPD on home oxygen, hypertension, GERD, diabetes mellitus, pneumonia, pancreatitis, arthritis, and chronic hypocalcemia admitted on 06/08/2017 after being found down on the floor by her family. Patient unable to describe events leading up to fall/syncope. Found to be hypoglycemic of 40 by EMS and given glucose. In ED, patient was hypotensive and hypothermic. 2L NS given with some improvement in blood pressure. Labs revealed hypocalcemia, hypomagnesemia, and hypokalemia. Followed by Dr. Rogue Bussing for breast cancer with mets to bone. Also with recent gastric bypass surgery secondary to abdominal tumor. Notes reviewed. Admitted to ICU for sepsis--urinary versus intra-abdominal. Hypotension requiring levophed infusion. Palliative medicine consultation for goals of care.   Clinical Assessment and Goals of Care: I have reviewed medical records, discussed with care team, and met with patient at bedside to discuss goals of care. Sophia Nelson is very irritable. Complains of nausea. RN gave prn zofran. Does not feel like talking this afternoon. Asking to rest. She does give me permission to call her son, Sophia Nelson. I spoke with son, Sophia Nelson via telephone. He works first shift and visits his mother in the evenings.   Introduced Palliative Medicine as specialized medical care for people living with serious illness. It focuses on providing relief from the symptoms and stress of a serious illness. The goal is to improve quality of life for both the patient and the family.  We  discussed a brief life review of the patient. Divorced. Two adult children. Lives at home with son, Sophia Nelson. She has another son who travels frequently as a truck driver. Sophia Nelson tells me she has had cancer for 8+ years. She is currently receiving chemotherapy and follows up with Dr. Rogue Bussing once a month. Per son, tolerating chemotherapy well besides nausea, which she takes medication as needed at home. Denies weight loss. Sophia Nelson tells me her appetite is fair. There is a family friend that visits throughout the week to assist with bathing.   Discussed hospital diagnoses, interventions, and underlying co-morbidities.   Advanced directives, concepts specific to code status, and artifical feeding and hydration were discussed. Sophia Nelson becomes even more irritable when I ask her about living will/HCPOA. "I don't know." Sophia Nelson tells me she does NOT have a documented living will or HCPOA. Encouraged Sophia Nelson to discuss her wishes regarding medical care and big decisions they are faced with due to progressive cancer and chronic disease (COPD). He tells me she has NOT spoken of her wishes regarding resuscitation/life support. Also explained importance of documenting a decision maker if she was unable to make decisions for herself. Encouraged they consider completing advanced directive packet during hospital stay.   Questions and concerns were addressed. Outpatient palliative services explained and  offered. Sophia Nelson appreciative of support from palliative.    SUMMARY OF RECOMMENDATIONS    FULL code/FULL scope  Patient resistant to Hundred conversation but gave me permission to discuss with son, Sophia Nelson.   Encouraged Sophia Nelson to discuss and consider completing advanced directive packet with his mother during hospitalization.   Oncology consult pending.  PMT will continue to support patient/family through hospitalization.  Code Status/Advance Care Planning:  Full code  Symptom Management:   Per  attending  Palliative Prophylaxis:   Bowel Regimen and Frequent Pain Assessment  Additional Recommendations (Limitations, Scope, Preferences):  Full Scope Treatment  Psycho-social/Spiritual:   Desire for further Chaplaincy support:yes  Additional Recommendations: Caregiving  Support/Resources  Prognosis:   Unable to determine  Discharge Planning: To Be Determined      Primary Diagnoses: Present on Admission: . Sepsis secondary to UTI [facility])   I have reviewed the medical record, interviewed the patient and family, and examined the patient. The following aspects are pertinent.  Past Medical History:  Diagnosis Date  . Abdominal distention   . AKI (acute kidney injury) ([place]) 02/24/2017  . Anemia   . Arthritis   . Asthma   . Back pain, chronic 03/15/2017  . Breast cancer ([place]) 2009   RT MASTECTOMY, DCIS  . 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 Cancer of left breast ([place]) 08/09/2015   T4 N1 M1 tumor, INVASIVE LOBULAR CARCINOMA.  . Carcinoma of overlapping sites of left breast in female, estrogen receptor positive ([place]) 03/22/2016  . COPD (chronic obstructive pulmonary disease) ([place])   . Diabetes ([place]) 02/24/2017  . Diabetes mellitus without complication ([place])   . Duodenal obstruction   . Encounter for nasogastric (NG) tube placement   . Gastric outlet obstruction 02/24/2017  . GERD (gastroesophageal reflux disease)   . Headache    h/o migraines as a child  . Hypertension   . Neck pain 04/19/2016  . Pancreatitis, acute 03/04/2017  . Pneumonia 2015  . Recurrent low back pain 03/15/2017  . Shortness of breath dyspnea    with exertion   Social History   Social History  . Marital status: Divorced    Spouse name: N/A  . Number of children: N/A  . Years of education: N/A   Social History Main Topics  . Smoking status: Current Some Day Smoker    Packs/day: 0.25    Years:  15.00    Types: Cigarettes    Last attempt to quit: 02/25/2016  . Smokeless tobacco: Never Used     Comment: currently as of 02-21-16 pt states she is only smoking 2-3 cigarettes per day  . 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 Drug use: No  . Sexual activity: Not Asked   Other Topics Concern  . None   Social History Narrative  . None   Family History  Problem Relation Age of Onset  . Lung cancer Father   . Breast cancer Neg Hx    Scheduled Meds: . abemaciclib  150 mg Oral BID  . aspirin EC  81 mg Oral Daily  . atorvastatin  10 mg Oral Daily  . docusate sodium  100 mg Oral BID  . enoxaparin (LOVENOX) injection  40 mg Subcutaneous Q24H  . feeding supplement (ENSURE ENLIVE)  237 mL Oral TID BM  .  fentaNYL  25 mcg Transdermal Q72H  . loratadine  10 mg Oral Daily  . mometasone-formoterol  2 puff Inhalation BID  . pantoprazole  40 mg Oral Daily  . sodium chloride flush  3 mL Intravenous Q12H  . tiotropium  18 mcg Inhalation BH-q7a  . tiZANidine  2 mg Oral BID   Continuous Infusions: . cefTRIAXone (ROCEPHIN)  IV    . lactated ringers    . magnesium sulfate LVP 250-500 ml 4 g (06/12/17 1200)  . norepinephrine (LEVOPHED) Adult infusion Stopped (06/12/17 0902)  . sodium chloride 1,000 mL (06/12/17 1457)  . sodium phosphate  Dextrose 5% IVPB 20 mmol (06/12/17 1200)   PRN Meds:.acetaminophen **OR** [DISCONTINUED] acetaminophen, albuterol, bisacodyl, guaiFENesin, HYDROcodone-acetaminophen, loperamide, magnesium hydroxide, morphine injection, ondansetron **OR** ondansetron (ZOFRAN) IV, oxyCODONE-acetaminophen, senna-docusate Medications Prior to Admission:  Prior to Admission medications   Medication Sig Start Date End Date Taking? Authorizing Provider  Abemaciclib 150 MG TABS Take 150 mg by mouth 2 (two) times daily. 05/02/17  Yes [name], MD  ADVAIR DISKUS 500-50 MCG/DOSE  AEPB Inhale 1 puff into the lungs 2 (two) times daily.  05/04/15  Yes [provider]  albuterol (PROVENTIL HFA;VENTOLIN HFA) 108 (90 Base) MCG/ACT inhaler Inhale 2 puffs into the lungs every 6 (six) hours as needed for wheezing or shortness of breath.   Yes [provider]  aspirin 81 MG tablet Take 81 mg by mouth daily.   Yes [provider]  atorvastatin (LIPITOR) 10 MG tablet Take 10 mg by mouth daily.   Yes [provider]  docusate sodium (COLACE) 100 MG capsule Take 100 mg by mouth 2 (two) times daily.   Yes [provider]  ergocalciferol (VITAMIN D2) 50000 units capsule Take 1 capsule (50,000 Units total) by mouth once a week. 05/30/17  Yes [name], MD  feeding supplement, ENSURE ENLIVE, (ENSURE ENLIVE) LIQD Take 237 mLs by mouth 3 (three) times daily between meals. 03/13/17  Yes Fritzi Mandes, MD  fluticasone (FLONASE) 50 MCG/ACT nasal spray Place 2 sprays into both nostrils daily.   Yes [provider]  GLIPIZIDE XL 2.5 MG 24 hr tablet Take 2.5 mg by mouth daily with breakfast.  06/23/15  Yes [provider]  lidocaine-prilocaine (EMLA) cream Apply 1 application topically as needed. 04/26/17  Yes [provider]  lisinopril (PRINIVIL,ZESTRIL) 40 MG tablet Take 40 mg by mouth daily.  01/03/17  Yes [provider]  loperamide (IMODIUM) 2 MG capsule One pill after each loose stool; maximum upto 8 pills a day. 05/02/17  Yes [name], MD  loratadine (CLARITIN) 10 MG tablet Take 10 mg by mouth daily.   Yes [provider]  metoCLOPramide (REGLAN) 10 MG tablet Take 1 tablet (10 mg total) by mouth 4 (four) times daily. 05/30/17  Yes [name], MD  montelukast (SINGULAIR) 10 MG tablet Take 10 mg by mouth at bedtime.   Yes [provider]  omeprazole (PRILOSEC) 20 MG capsule Take 20 mg by mouth every morning.  06/23/15  Yes [provider]  oxyCODONE-acetaminophen  (PERCOCET/ROXICET) 5-325 MG tablet Take 1 tablet by mouth every 8 (eight) hours as needed for severe pain. 06/01/17  Yes [name], MD  OXYGEN Inhale 2 L into the lungs continuous.    Yes [provider]  SPIRIVA HANDIHALER 18 MCG inhalation capsule Place 18 mcg into inhaler and inhale every morning.  05/04/15  Yes [provider]  tiZANidine (ZANAFLEX) 2 MG tablet  Take 1 tablet by mouth 2 (two) times daily. 04/12/17  Yes [provider]   Allergies  Allergen Reactions  . Other     Onions(that grow in the yard-pt can eat onions without problems) and dust mite Sneezing, cough, runny nose   Review of Systems  Constitutional: Positive for activity change and fatigue.  Gastrointestinal: Positive for nausea.  Neurological: Positive for dizziness and weakness.   Physical Exam  Constitutional: She is oriented to person, place, and time. She is cooperative. She appears ill.  HENT:  Head: Normocephalic and atraumatic.  Cardiovascular: Regular rhythm.   Hypotension-PCCM NP ordered NS bolus  Pulmonary/Chest: No accessory muscle usage. No tachypnea. No respiratory distress. She has decreased breath sounds.  Abdominal: There is no tenderness.  Neurological: She is alert and oriented to person, place, and time.  Skin: Skin is warm and dry.  Psychiatric:  irritable  Nursing note and vitals reviewed.  Vital Signs: BP (!) 163/65   Pulse (!) 59   Temp (!) 96.3 F (35.7 C) (Axillary)   Resp 18   Ht 5' 1.5" (1.562 m)   Wt 62.4 kg (137 lb 9.1 oz)   SpO2 100%   BMI 25.57 kg/m  Pain Assessment: No/denies pain POSS *See Group Information*: 1-Acceptable,Awake and alert Pain Score: 0-No pain  SpO2: SpO2: 100 % O2 Device:SpO2: 100 % O2 Flow Rate: .O2 Flow Rate (L/min): 2 L/min  IO: Intake/output summary:   Intake/Output Summary (Last 24 hours) at 06/12/17 1544 Last data filed at 06/12/17 1200  Gross per 24 hour  Intake          2021.77 ml  Output              2325 ml  Net          -303.23 ml    LBM: Last BM Date: 06/05/17 Baseline Weight: Weight: 58.1 kg (128 lb) Most recent weight: Weight: 62.4 kg (137 lb 9.1 oz)     Palliative Assessment/Data: PPS 50%   Flowsheet Rows     Most Recent Value  Intake Tab  Referral Department  Critical care  Unit at Time of Referral  ICU  Palliative Care Primary Diagnosis  Cancer  Palliative Care Type  New Palliative care  Reason for referral  Clarify Goals of Care  Date first seen by Palliative Care  06/12/17  Clinical Assessment  Palliative Performance Scale Score  50%  Psychosocial & Spiritual Assessment  Palliative Care Outcomes  Patient/Family meeting held?  Yes  Who was at the meeting?  patient, spoke with son via telephone  Palliative Care Outcomes  Clarified goals of care, Provided psychosocial or spiritual support, ACP counseling assistance, Improved non-pain symptom therapy      Time In: 1415 Time Out: 1525 Time Total: 1mn Greater than 50%  of this time was spent counseling and coordinating care related to the above assessment and plan.  Signed by:  MIhor Dow FNP-C Palliative Medicine Team  Phone: [phone]: [phone]  Please contact Palliative Medicine Team phone at [phone] questions and concerns.  For individual provider: See AShea Evans

## 2017-06-12 NOTE — Telephone Encounter (Signed)
I spoke to pt's son Kelby Fam re: prognosis/plan of care. See PN for details.

## 2017-06-13 LAB — CBC
HEMATOCRIT: 26.2 % — AB (ref 35.0–47.0)
Hemoglobin: 8.8 g/dL — ABNORMAL LOW (ref 12.0–16.0)
MCH: 28.9 pg (ref 26.0–34.0)
MCHC: 33.5 g/dL (ref 32.0–36.0)
MCV: 86.3 fL (ref 80.0–100.0)
Platelets: 157 10*3/uL (ref 150–440)
RBC: 3.03 MIL/uL — ABNORMAL LOW (ref 3.80–5.20)
RDW: 21.7 % — AB (ref 11.5–14.5)
WBC: 3.1 10*3/uL — ABNORMAL LOW (ref 3.6–11.0)

## 2017-06-13 LAB — BASIC METABOLIC PANEL
Anion gap: 3 — ABNORMAL LOW (ref 5–15)
BUN: 6 mg/dL (ref 6–20)
CALCIUM: 7.7 mg/dL — AB (ref 8.9–10.3)
CHLORIDE: 110 mmol/L (ref 101–111)
CO2: 25 mmol/L (ref 22–32)
CREATININE: 0.44 mg/dL (ref 0.44–1.00)
GFR calc Af Amer: >60 mL/min (ref >60)
GFR calc non Af Amer: >60 mL/min (ref >60)
GLUCOSE: 99 mg/dL (ref 65–99)
Potassium: 5.2 mmol/L — ABNORMAL HIGH (ref 3.5–5.1)
Sodium: 138 mmol/L (ref 135–145)

## 2017-06-13 LAB — PHOSPHORUS: Phosphorus: 2.4 mg/dL — ABNORMAL LOW (ref 2.5–4.6)

## 2017-06-13 LAB — CORTISOL: CORTISOL PLASMA: 8.5 ug/dL

## 2017-06-13 LAB — MAGNESIUM: Magnesium: 1.9 mg/dL (ref 1.7–2.4)

## 2017-06-13 LAB — GLUCOSE, CAPILLARY
GLUCOSE-CAPILLARY: 108 mg/dL — AB (ref 65–99)
GLUCOSE-CAPILLARY: 90 mg/dL (ref 65–99)
GLUCOSE-CAPILLARY: 91 mg/dL (ref 65–99)
Glucose-Capillary: 80 mg/dL (ref 65–99)
Glucose-Capillary: 81 mg/dL (ref 65–99)
Glucose-Capillary: 91 mg/dL (ref 65–99)

## 2017-06-13 LAB — CALCIUM, IONIZED: CALCIUM, IONIZED, SERUM: 4.2 mg/dL — AB (ref 4.5–5.6)

## 2017-06-13 MED ORDER — VITAMIN D (ERGOCALCIFEROL) 1.25 MG (50000 UNIT) PO CAPS
50000.0000 [IU] | ORAL_CAPSULE | ORAL | Status: DC
  Administered 2017-06-13: 50000 [IU] via ORAL
  Filled 2017-06-13: qty 1

## 2017-06-13 MED ORDER — COSYNTROPIN 0.25 MG IJ SOLR
0.2500 mg | Freq: Once | INTRAMUSCULAR | Status: AC
  Administered 2017-06-14: 0.25 mg via INTRAVENOUS
  Filled 2017-06-13: qty 0.25

## 2017-06-13 MED ORDER — DEXTROSE IN LACTATED RINGERS 5 % IV SOLN
INTRAVENOUS | Status: DC
  Administered 2017-06-13: 20:00:00 via INTRAVENOUS

## 2017-06-13 NOTE — Progress Notes (Signed)
Initial Nutrition Assessment  DOCUMENTATION CODES:   Severe malnutrition in context of chronic illness  INTERVENTION:  Recommend liberalizing to regular diet.  Continue Ensure Enlive po TID, each supplement provides 350 kcal and 20 grams of protein.  Encouraged adequate intake of calories and protein through small, frequent meals throughout the day. Patient is not currently meeting a significant enough of her elevated calorie and protein needs and is continuing to lose a significant amount of weight. There is also a component of malabsorption in setting of recent gastrojejunostomy.  Recommend complete multivitamin with iron BID (chewable or liquid), sublingual vitamin B12 350-500 micrograms daily, chewable calcium citrate with vitamin D 500 mg TID. For best absorption it is best to take MVI with iron at least 2 hours apart from calcium.  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   Met with patient at bedside. She is known to this RD from previous admission in July. She was  initially started on TPN, but was able to transition to a full liquid diet with Ensure Enlive TID prior to discharge, which is also the diet she discharged on. Patient not feeling well today, so not able to provide a very thorough history. She reports she has not had a good appetite lately. She is eating some small meals during the day. She has had some fish, pinto beans, and eggs. She has been drinking Ensure when they bring them to her (TID) and enjoys them. Patient cannot recall when she was able to advance her diet from full liquids. Did not see mention of diet advancement in outpatient surgery follow-up notes.  Per discussion on rounds this week patient is not eating much at meals and is just drinking her Ensure TID. That is only providing approximately 1050 kcal daily (68% minimum estimated kcal needs).  Her UBW was 178 lbs. Patient was 166.6 lbs on 11/12/2016, 145.8 lbs on 03/20/2017, and is currently 137.5 lbs. Patient reports she is actually in the 120s, so our weight may not be accurate. Patient has lost at least 29.1 lbs (17.5% body weight) over 7 months, which is significant for time frame.  Medications reviewed and include: Colace, pantoprazole, vitamin D 50000 units every 7 days, ceftriaxone, LR @ 50 ml/hr, Levophed gtt (off since 6553 this AM).  Labs reviewed: CBG 80-123, Potassium 5.2, Anion gap 3, Phosphorus 2.4.  Nutrition-Focused physical exam completed. Findings are moderate fat depletion (moderate depletion of orbital region, upper arm region, thoracic/lumbar region), mild-moderate muscle depletion (mild depletion of patellar region, anterior thigh region, posterior calf region; moderate depletion of temple region, clavicle bone region, clavicle/acromion bone region,  scapular bone region, dorsal hand), and no edema.   Discussed in rounds.  Diet Order:  Diet heart healthy/carb modified Room service appropriate? Yes; Fluid consistency: Thin  Skin:  Reviewed, no issues  Last BM:   PTA (10/10/218 per chart)  Height:   Ht Readings from Last 1 Encounters:  06/09/17 5' 1.5" (1.562 m)    Weight:   Wt Readings from Last 1 Encounters:  06/09/17 137 lb 9.1 oz (62.4 kg)    Ideal Body Weight:  48.9 kg  BMI:  Body mass index is 25.57 kg/m.  Estimated Nutritional Needs:   Kcal:  1540-1760 (MSJ x 1.4-1.6)  Protein:  80-95 grams (1.3-1.5 grams/kg)  Fluid:  1.8 L/day (30 ml/kg)  EDUCATION NEEDS:   Education needs addressed  Willey Blade, [place], RD, [place] Office: [phone] Pager: [phone] After Hours/Weekend Pager: [phone]

## 2017-06-13 NOTE — Progress Notes (Signed)
Sophia Nelson   DOB:1946-02-15   JM#:[id number]    Subjective: Patient denies any pain or shortness of breath. She continues to feel weak. She continues to be on Levophed/pressors. Unable to wean off the pressors.   Objective:  Vitals:   06/13/17 1500 06/13/17 1600  BP: (!) 83/50 (!) 89/50  Pulse: 75 82  Resp: 14 14  Temp:    SpO2: 100% 91%     Intake/Output Summary (Last 24 hours) at 06/13/17 1721 Last data filed at 06/13/17 1600  Gross per 24 hour  Intake          1641.93 ml  Output             1840 ml  Net          -198.07 ml    GENERAL: Well-nourished well-developed; Alert, no distress and comfortable.   Alone. EYES: no pallor or icterus OROPHARYNX: no thrush or ulceration. NECK: supple, no masses felt LYMPH:  no palpable lymphadenopathy in the cervical, axillary or inguinal regions LUNGS: decreased breath sounds to auscultation at bases and  No wheeze or crackles HEART/CVS: regular rate & rhythm and no murmurs; No lower extremity edema ABDOMEN: abdomen soft, non-tender and normal bowel sounds Musculoskeletal:no cyanosis of digits and no clubbing  PSYCH: alert & oriented x 3 with fluent speech NEURO: no focal motor/sensory deficits SKIN:  no rashes or significant lesions    Labs:  Lab Results  Component Value Date   WBC 3.1 (L) 06/13/2017   HGB 8.8 (L) 06/13/2017   HCT 26.2 (L) 06/13/2017   MCV 86.3 06/13/2017   PLT 157 06/13/2017   NEUTROABS 1.7 05/30/2017    Lab Results  Component Value Date   NA 138 06/13/2017   K 5.2 (H) 06/13/2017   CL 110 06/13/2017   CO2 25 06/13/2017    Studies:  No results found.  Assessment & Plan:   71 year old female patient with a history of metastatic lobular cancer currently admitted to hospital for sepsis/hypotension.    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 On Faslodex plus abema. Hold abema sec to acute  illness/severe anemia. Patient has multiple treatment options available.     # Multiple electrolyte abnormalities including-hypocalcemia/hypomagnesemia hypophosphatemia- suspect poor nutrition; and also Xgeva. Improvement noted. Continue correction of the electrolyte abnormalities.   # Hypotension/sepsis-question UTI - on vasopressors. Question adrenal insufficiency; defer to primary team for further workup.  # Patient evaluated at palliative care; discussed with [facility].Discussed with the patient/and son.   [name], MD 06/13/2017  5:21 PM

## 2017-06-13 NOTE — Progress Notes (Signed)
PULMONARY / CRITICAL CARE MEDICINE   Name: Sophia Nelson MRN: [id number] DOB: February 18, 1946    ADMISSION DATE:  06/08/2017  PT PROFILE:   39 F with history of metastatic breast cancer, DM admitted via ED with syncope, multiple electrolyte abnormalities, hypotension. CT head 10/14: no acute findings  MAJOR EVENTS/TEST RESULTS: 10/13 Admission 10/14 CT head: NAD 10/15 Weaning vasopressors 10/16 Anemic, transfused 1 unit of PRBCs; oncology consulted 10/17 Levophed gtt restarted   INDWELLING DEVICES: None   MICRO DATA: MRSA PCR 0/14 >> NEG Urine 10/13 >> NEG  ANTIMICROBIALS:  Metronidazole 10/14 >> 10/15 Ceftriaxone 10/14 >>   SUBJECTIVE:  No complaints at this time   VITAL SIGNS: BP (!) 96/57   Pulse 78   Temp 98 F (36.7 C) (Oral)   Resp 13   Ht 5' 1.5" (1.562 m)   Wt 62.4 kg (137 lb 9.1 oz)   SpO2 98%   BMI 25.57 kg/m   HEMODYNAMICS:    VENTILATOR SETTINGS:    INTAKE / OUTPUT: I/O last 3 completed shifts: In: 3032.6 [I.V.:2322.6; Blood:290; IV Piggyback:420] Out: 1093 [Urine:3340]  PHYSICAL EXAMINATION: General: frail appearing, NAD Neuro: AAO X3, no focal deficits HEENT: NCAT, sclerae white Cardiovascular: nsr, s1s2, no M/R/G Lungs: clear anteriorly, diminished in the bases Abdomen: hypoactive BS x4, soft, non tender, mildly distended  Ext: cool, no edema  LABS:  BMET  Recent Labs Lab 06/11/17 0455 06/12/17 0434 06/13/17 0458  NA 135 137 138  K 3.9 4.7 5.2*  CL 105 107 110  CO2 26 26 25   BUN <5* 5* 6  CREATININE 0.62 0.63 0.44  GLUCOSE 136* 107* 99    Electrolytes  Recent Labs Lab 06/10/17 0411 06/11/17 0455 06/12/17 0434 06/13/17 0458  CALCIUM 6.3* 6.4* 6.9* 7.7*  MG 1.7  --  1.4* 1.9  PHOS 2.3*  --  1.7* 2.4*    CBC  Recent Labs Lab 06/11/17 0455 06/12/17 0434 06/13/17 0458  WBC 2.5* 3.0* 3.1*  HGB 6.9* 8.3* 8.8*  HCT 20.3* 24.5* 26.2*  PLT 159 141* 157    Coag's No results for input(s): APTT, INR in the last  168 hours.  Sepsis Markers  Recent Labs Lab 06/08/17 2325 06/09/17 1441 06/10/17 0411 06/11/17 0455  LATICACIDVEN 1.3 1.1  --   --   PROCALCITON  --  1.51 0.70 0.33    ABG No results for input(s): PHART, PCO2ART, PO2ART in the last 168 hours.  Liver Enzymes  Recent Labs Lab 06/09/17 1441 06/10/17 0011 06/10/17 0411 06/11/17 0455  AST 73* 48*  --  30  ALT 38 32  --  24  ALKPHOS 147* 127*  --  117  BILITOT 0.9 0.5  --  0.4  ALBUMIN 1.9* 1.8* 1.9* 1.6*    Cardiac Enzymes  Recent Labs Lab 06/08/17 2325  TROPONINI <0.03    Glucose  Recent Labs Lab 06/12/17 1442 06/12/17 1628 06/12/17 1936 06/12/17 2333 06/13/17 0411 06/13/17 0757  GLUCAP 122* 120* 111* 123* 108* 80   No results found.  ASSESSMENT Metastatic breast cancer Recent laparotomy for gastric outlet obstruction due to breast cancer metastasis Syncope Hypotension-will r/o adrenal insufficiency   Possible UTI although urine culture negative, however UA positive  Chronic hypocalcemia Hypomagnesia  Chronic anemia Mildly elevated PCT-improved  Pleural effusion Anemia-likely secondary to malignancy-improved    PLAN Oncology and Palliative consulted appreciate input  Chemotherapy per oncology recommendations  Maintain map >60  Cortisol level pending Corticotropin testing in the am 06/14/17 to r/o adrenal  insufficiency-pharmacy to manage  Ceftriaxone dose decreased-10/17; will treat for total of 7 days Trend BMP  Replace electrolytes as indicated  Monitor UOP Trend CBC Monitor for s/sx of bleeding  Transfuse for hgb <7 Lovenox for VTE prophylaxis-if hgb continues to drop will discontinue   Prn morphine and duragesic patch for pain management  PT consult placed-frequent OOB to chair  Prn milk of magnesium for constipation    Marda Stalker, [place] Pager [phone] (please enter 7 digits) PCCM Consult Pager [phone] (please enter 7 digits)   PCCM  ATTENDING ATTESTATION:  I have evaluated patient with the APP [place], reviewed database in its entirety and discussed care plan in detail. In addition, this patient was discussed on multidisciplinary rounds. I agree with the above assessment, findings and plan.  Merton Border, MD PCCM service Mobile [phone] Pager [phone] 06/13/2017 12:15 PM

## 2017-06-13 NOTE — Progress Notes (Signed)
[place] [facility] [place] NAME: Sophia Nelson    MR#:  [id number]  DATE OF BIRTH:  March 30, 1946  SUBJECTIVE:  CHIEF COMPLAINT:   Chief Complaint  Patient presents with  . Loss of Consciousness    unwitnessed - found on floor  . Hypoglycemia   Still on levophed 2 mcg. No pain/sob  Her blood pressures from outpatient office reviewed and SBP 110-130  REVIEW OF SYSTEMS:    Review of Systems  Constitutional: Positive for malaise/fatigue. Negative for chills and fever.  HENT: Negative for sore throat.   Eyes: Negative for blurred vision, double vision and pain.  Respiratory: Negative for cough, hemoptysis, shortness of breath and wheezing.   Cardiovascular: Negative for chest pain, palpitations, orthopnea and leg swelling.  Gastrointestinal: Negative for abdominal pain, constipation, diarrhea, heartburn, nausea and vomiting.  Genitourinary: Negative for dysuria and hematuria.  Musculoskeletal: Negative for back pain and joint pain.  Skin: Negative for rash.  Neurological: Positive for weakness. Negative for sensory change, speech change, focal weakness and headaches.  Endo/Heme/Allergies: Does not bruise/bleed easily.  Psychiatric/Behavioral: Negative for depression. The patient is not nervous/anxious.     DRUG ALLERGIES:   Allergies  Allergen Reactions  . Other     Onions(that grow in the yard-pt can eat onions without problems) and dust mite Sneezing, cough, runny nose    VITALS:  Blood pressure (!) 91/46, pulse 71, temperature 98 F (36.7 C), temperature source Oral, resp. rate 14, height 5' 1.5" (1.562 m), weight 62.4 kg (137 lb 9.1 oz), SpO2 99 %.  PHYSICAL EXAMINATION:   Physical Exam  GENERAL:  71 y.o.-year-old patient lying in the bed with no acute distress.  EYES: Pupils equal, round, reactive to light and accommodation. No scleral icterus. Extraocular muscles intact.  HEENT: Head atraumatic, normocephalic. Oropharynx and  nasopharynx clear.  NECK:  Supple, no jugular venous distention. No thyroid enlargement, no tenderness.  LUNGS: Normal breath sounds bilaterally, no wheezing, rales, rhonchi. No use of accessory muscles of respiration.  CARDIOVASCULAR: S1, S2 normal. No murmurs, rubs, or gallops.  ABDOMEN: Soft, nontender, nondistended. Bowel sounds present. No organomegaly or mass.  EXTREMITIES: No cyanosis, clubbing or edema b/l.    NEUROLOGIC: Cranial nerves II through XII are intact. No focal Motor or sensory deficits b/l.   PSYCHIATRIC: The patient is alert and awake SKIN: No obvious rash, lesion, or ulcer.   LABORATORY PANEL:   CBC  Recent Labs Lab 06/13/17 0458  WBC 3.1*  HGB 8.8*  HCT 26.2*  PLT 157   ------------------------------------------------------------------------------------------------------------------ Chemistries   Recent Labs Lab 06/11/17 0455  06/13/17 0458  NA 135  < > 138  K 3.9  < > 5.2*  CL 105  < > 110  CO2 26  < > 25  GLUCOSE 136*  < > 99  BUN <5*  < > 6  CREATININE 0.62  < > 0.44  CALCIUM 6.4*  < > 7.7*  MG  --   < > 1.9  AST 30  --   --   ALT 24  --   --   ALKPHOS 117  --   --   BILITOT 0.4  --   --   < > = values in this interval not displayed. ------------------------------------------------------------------------------------------------------------------  Cardiac Enzymes  Recent Labs Lab 06/08/17 2325  TROPONINI <0.03   ------------------------------------------------------------------------------------------------------------------  RADIOLOGY:  No results found. ASSESSMENT AND PLAN:   This is a 71 y.o. female with a history of  bilateral breast cancer with bone metastasesstatus post mastectomy, diabetes, GERD, hypertension, home O2 dependent COPD, chronic hypocalcemia now being admitted with:  # septic shock secondary to UTI. Cx negative  On IV ceftriaxone Day 4. Still on Levophed Bcx not drawn on admission  #. AKI  due to septic  shock/ATN Resolved  #. Diabetes with hypoglycemia - resolved - Accuchecks  - Held glipizide  #. History of breast cancer - Continue abemaciclib  #. History of asthma with chronic resp failure - Continue Flonase, Singulair, Advair, Spiriva  # Anemia of chronic disease 1 unit PRBC transfused 06/11/2017 Improved   All the records are reviewed and case discussed with Care Management/Social Workerr. Management plans discussed with the patient, family and they are in agreement.  CODE STATUS: FULL CODE  DVT Prophylaxis: SCDs  TOTAL TIME TAKING CARE OF THIS PATIENT: 35 minutes.   Hillary Bow R M.D on 06/13/2017 at 8:55 AM  Between 7am to 6pm - Pager - [phone]  After 6pm go to [email or web address] - password [email or web address] [facility]  Office  [phone]  CC: Primary care physician; Donnie Coffin, MD  Note: This dictation was prepared with Dragon dictation along with smaller phrase technology. Any transcriptional errors that result from this process are unintentional.

## 2017-06-14 LAB — CBC WITH DIFFERENTIAL/PLATELET
BASOS ABS: 0 10*3/uL (ref 0–0.1)
Basophils Relative: 1 %
Eosinophils Absolute: 0 10*3/uL (ref 0–0.7)
Eosinophils Relative: 1 %
HEMATOCRIT: 25.7 % — AB (ref 35.0–47.0)
Hemoglobin: 8.5 g/dL — ABNORMAL LOW (ref 12.0–16.0)
LYMPHS PCT: 30 %
Lymphs Abs: 1.2 10*3/uL (ref 1.0–3.6)
MCH: 28.9 pg (ref 26.0–34.0)
MCHC: 33.2 g/dL (ref 32.0–36.0)
MCV: 87 fL (ref 80.0–100.0)
MONO ABS: 0.4 10*3/uL (ref 0.2–0.9)
MONOS PCT: 10 %
NEUTROS ABS: 2.3 10*3/uL (ref 1.4–6.5)
Neutrophils Relative %: 58 %
Platelets: 172 10*3/uL (ref 150–440)
RBC: 2.96 MIL/uL — ABNORMAL LOW (ref 3.80–5.20)
RDW: 22.5 % — AB (ref 11.5–14.5)
WBC: 4 10*3/uL (ref 3.6–11.0)

## 2017-06-14 LAB — BASIC METABOLIC PANEL
ANION GAP: 4 — AB (ref 5–15)
BUN: 7 mg/dL (ref 6–20)
CALCIUM: 7.8 mg/dL — AB (ref 8.9–10.3)
CO2: 27 mmol/L (ref 22–32)
Chloride: 106 mmol/L (ref 101–111)
Creatinine, Ser: 0.58 mg/dL (ref 0.44–1.00)
GFR calc Af Amer: >60 mL/min (ref >60)
GFR calc non Af Amer: >60 mL/min (ref >60)
Glucose, Bld: 118 mg/dL — ABNORMAL HIGH (ref 65–99)
Potassium: 5.1 mmol/L (ref 3.5–5.1)
Sodium: 137 mmol/L (ref 135–145)

## 2017-06-14 LAB — GLUCOSE, CAPILLARY
GLUCOSE-CAPILLARY: 115 mg/dL — AB (ref 65–99)
GLUCOSE-CAPILLARY: 91 mg/dL (ref 65–99)
GLUCOSE-CAPILLARY: 177 mg/dL — AB (ref 65–99)
GLUCOSE-CAPILLARY: 160 mg/dL — AB (ref 65–99)
Glucose-Capillary: 148 mg/dL — ABNORMAL HIGH (ref 65–99)

## 2017-06-14 LAB — ACTH STIMULATION, 3 TIME POINTS
CORTISOL 60 MIN: 17.8 ug/dL
Cortisol, 30 Min: 9.8 ug/dL
Cortisol, Base: 16.9 ug/dL

## 2017-06-14 MED ORDER — MONTELUKAST SODIUM 10 MG PO TABS
10.0000 mg | ORAL_TABLET | Freq: Every day | ORAL | Status: DC
  Administered 2017-06-14 – 2017-06-16 (×3): 10 mg via ORAL
  Filled 2017-06-14 (×3): qty 1

## 2017-06-14 MED ORDER — LACTATED RINGERS IV SOLN
INTRAVENOUS | Status: DC
  Administered 2017-06-14: 17:00:00 via INTRAVENOUS

## 2017-06-14 MED ORDER — HYDROCORTISONE NA SUCCINATE PF 100 MG IJ SOLR
50.0000 mg | Freq: Two times a day (BID) | INTRAMUSCULAR | Status: DC
  Administered 2017-06-14 (×2): 50 mg via INTRAVENOUS
  Filled 2017-06-14 (×2): qty 2

## 2017-06-14 NOTE — Progress Notes (Signed)
Patient became dizzy and nauseated therefore the newly applied Fentanyl skin patch was removed and the skin cleaned. Patient did vomit once. We laid her down in the chair due to decrease her dizziness. Fentanyl patch was placed in the sharps bin and witnessed by a second Therapist, sports.

## 2017-06-14 NOTE — Progress Notes (Signed)
PULMONARY / CRITICAL CARE MEDICINE   Name: Sophia Nelson MRN: [id number] DOB: 01-May-1946    ADMISSION DATE:  06/08/2017  PT PROFILE:   53 F with history of metastatic breast cancer, DM admitted via ED with syncope, multiple electrolyte abnormalities, hypotension. CT head 10/14: no acute findings  MAJOR EVENTS/TEST RESULTS: 10/13 Admission 10/14 CT head: NAD 10/15 Weaning vasopressors 10/16 Anemic, transfused 1 unit of PRBCs; oncology consulted 10/17 Levophed gtt restarted  10/18 Levophed gtt restarted for brief period overnight   INDWELLING DEVICES: None   MICRO DATA: MRSA PCR 0/14 >>NEG Urine 10/13 >>NEG  ANTIMICROBIALS:  Metronidazole 10/14 >> 10/15 Ceftriaxone 10/14 >>   SUBJECTIVE:  No complaints at this time   VITAL SIGNS: BP (!) 85/54   Pulse (!) 104   Temp 98.3 F (36.8 C) (Oral)   Resp 18   Ht 5' 1.5" (1.562 m)   Wt 62.4 kg (137 lb 9.1 oz)   SpO2 92%   BMI 25.57 kg/m   HEMODYNAMICS:    VENTILATOR SETTINGS:    INTAKE / OUTPUT: I/O last 3 completed shifts: In: 2237.3 [P.O.:360; I.V.:1877.3] Out: 2740 [Urine:2740]  PHYSICAL EXAMINATION: General: frail appearing, NAD Neuro: AAO X3, no focal deficits HEENT: NCAT, sclerae white Cardiovascular: nsr, s1s2, no M/R/G Lungs: clear anteriorly, diminished in the bases Abdomen: hypoactive BS x4, soft, non tender, mildly distended  Ext: cool, no edema  LABS:  BMET  Recent Labs Lab 06/12/17 0434 06/13/17 0458 06/14/17 0504  NA 137 138 137  K 4.7 5.2* 5.1  CL 107 110 106  CO2 26 25 27   BUN 5* 6 7  CREATININE 0.63 0.44 0.58  GLUCOSE 107* 99 118*    Electrolytes  Recent Labs Lab 06/10/17 0411  06/12/17 0434 06/13/17 0458 06/14/17 0504  CALCIUM 6.3*  < > 6.9* 7.7* 7.8*  MG 1.7  --  1.4* 1.9  --   PHOS 2.3*  --  1.7* 2.4*  --   < > = values in this interval not displayed.  CBC  Recent Labs Lab 06/12/17 0434 06/13/17 0458 06/14/17 0504  WBC 3.0* 3.1* 4.0  HGB 8.3* 8.8* 8.5*   HCT 24.5* 26.2* 25.7*  PLT 141* 157 172    Coag's No results for input(s): APTT, INR in the last 168 hours.  Sepsis Markers  Recent Labs Lab 06/08/17 2325 06/09/17 1441 06/10/17 0411 06/11/17 0455  LATICACIDVEN 1.3 1.1  --   --   PROCALCITON  --  1.51 0.70 0.33    ABG No results for input(s): PHART, PCO2ART, PO2ART in the last 168 hours.  Liver Enzymes  Recent Labs Lab 06/09/17 1441 06/10/17 0011 06/10/17 0411 06/11/17 0455  AST 73* 48*  --  30  ALT 38 32  --  24  ALKPHOS 147* 127*  --  117  BILITOT 0.9 0.5  --  0.4  ALBUMIN 1.9* 1.8* 1.9* 1.6*    Cardiac Enzymes  Recent Labs Lab 06/08/17 2325  TROPONINI <0.03    Glucose  Recent Labs Lab 06/13/17 1206 06/13/17 1658 06/13/17 1941 06/13/17 2334 06/14/17 0357 06/14/17 0754  GLUCAP 90 91 81 91 115* 91   No results found.  ASSESSMENT Metastatic breast cancer Recent laparotomy for gastric outlet obstruction due to breast cancer metastasis Syncope Hypotension-will r/o adrenal insufficiency   Possible UTI although urine culture negative, however UA positive  Chronic hypocalcemia Hypomagnesia  Chronic anemia Mildly elevated PCT-improved  Pleural effusion Anemia-likely secondary to malignancy-improved    PLAN Oncology and Palliative consulted  appreciate input  Chemotherapy per oncology recommendations  Maintain map >60  Cosyntropin sim testing 06/14/17 to r/o adrenal insufficiency-pharmacy to manage will give a dose of solu-cortef once testing complete Ceftriaxone dose decreased-10/17; will treat for total of 7 days stop date 10/24 Trend BMP  Replace electrolytes as indicated  Monitor UOP Trend CBC Monitor for s/sx of bleeding  Transfuse for hgb <7 Lovenox for VTE prophylaxis Prn morphine and duragesic patch for pain management  PT consult placed-frequent OOB to chair  Prn milk of magnesium for constipation   -Pt needs to remain off of levophed gtt for at least 24 hrs prior to transfer  out of ICU.  Marda Stalker, [place] Pager [phone] (please enter 7 digits) PCCM Consult Pager [phone] (please enter 7 digits)  PCCM ATTENDING ATTESTATION:  I have evaluated patient with the APP [place], reviewed database in its entirety and discussed care plan in detail. In addition, this patient was discussed on multidisciplinary rounds. I agree with the above findings, assessment and plan. We have performed a cosyntropin stimulation test today and are awaiting the results of that test. Hydrocortisone has been empirically initiated. She is presently off of norepinephrine and if she remains off through the night, we will transfer her to MedSurg floor in a.m.  Merton Border, MD PCCM service Mobile [phone] Pager [phone] 06/14/2017 3:02 PM

## 2017-06-14 NOTE — Progress Notes (Signed)
Physical Therapy Treatment Patient Details Name: Sophia Nelson MRN: [id number] DOB: October 19, 1945 Today's Date: 06/14/2017    History of Present Illness Pt is a 71 y.o. F with a PMH including AKI, anemia, breast cancer, COPD, DM, GERD, headaches, HTN, and SOB dyspnea. Presents to ED on 10/13 s/p fall with LOC secondary to hypoglycemia, hypotension; admitted with sepsis secondary to UTI. Imaging negative for any acute abnormalties.  Extended ICU stay d/t hypotension and on pressors.    PT Comments    Pt reports having a couple bad experiences up in chair today and yesterday and initially declining to get OOB.  With further discussion, pt reporting concerns regarding BP dropping.  Pt agreeable to LE ex's in bed (L LE weakness noted to be greater than R LE weakness; pt reports baseline L LE weakness since accident in March) and then with encouragement pt agreeable to sitting on edge of bed.  Pt tolerated all of this well with BP 88/53 initially supine in bed; 98/63 after performing LE ex's in bed; and 118/63 after a few minutes sitting up on edge of bed (pt performed some LE ex's in sitting).  Pt appearing fatigued but more encouraged by BP staying up; pt assisted back to bed to rest (pt reports being up in chair a good portion of day).  Will continue to progress pt with strengthening, activity tolerance, and progressive functional mobility per pt tolerance.    Follow Up Recommendations  SNF     Equipment Recommendations  Rolling walker with 5" wheels    Recommendations for Other Services       Precautions / Restrictions Precautions Precautions: Fall Restrictions Weight Bearing Restrictions: No    Mobility  Bed Mobility Overal bed mobility: Needs Assistance Bed Mobility: Supine to Sit;Sit to Supine     Supine to sit: Mod assist Sit to supine: Min guard   General bed mobility comments: mod assist for trunk supine to sit; CGA for safety and assist for lines sit to  supine  Transfers                 General transfer comment: pt declined transfers d/t fatigue and d/t being up in chair a good portion of day  Ambulation/Gait                 Stairs            Wheelchair Mobility    Modified Rankin (Stroke Patients Only)       Balance Overall balance assessment: Needs assistance Sitting-balance support: Bilateral upper extremity supported Sitting balance-Leahy Scale: Fair Sitting balance - Comments: CGA sitting performing B LE ex's                                    Cognition Arousal/Alertness: Awake/alert Behavior During Therapy: WFL for tasks assessed/performed Overall Cognitive Status: Within Functional Limits for tasks assessed                                        Exercises Total Joint Exercises Ankle Circles/Pumps: AROM;Strengthening;10 reps;Supine Quad Sets: AROM;Strengthening;Both;10 reps;Supine Gluteal Sets: AROM;Strengthening;Both;10 reps;Supine Short Arc Quad: AROM;Strengthening;Both;10 reps;Supine Heel Slides: AROM;Right;AAROM;Left;Strengthening;10 reps;Supine Hip ABduction/ADduction: AROM;Right;AAROM;Left;Strengthening;10 reps;Supine    General Comments   Nursing cleared pt for participation in physical therapy.  Pt agreeable to PT session.  Pertinent Vitals/Pain Pain Assessment: No/denies pain Pain Intervention(s): Limited activity within patient's tolerance;Monitored during session;Repositioned  HR and O2 on supplemental O2 WFL during session.    Home Living                      Prior Function            PT Goals (current goals can now be found in the care plan section) Acute Rehab PT Goals Patient Stated Goal: to be able to go home PT Goal Formulation: With patient Time For Goal Achievement: 06/24/17 Potential to Achieve Goals: Fair Progress towards PT goals: Progressing toward goals    Frequency    Min 2X/week      PT Plan Current  plan remains appropriate    Co-evaluation              AM-PAC PT "6 Clicks" Daily Activity  Outcome Measure  Difficulty turning over in bed (including adjusting bedclothes, sheets and blankets)?: A Little Difficulty moving from lying on back to sitting on the side of the bed? : Unable Difficulty sitting down on and standing up from a chair with arms (e.g., wheelchair, bedside commode, etc,.)?: Unable Help needed moving to and from a bed to chair (including a wheelchair)?: A Lot Help needed walking in hospital room?: A Lot Help needed climbing 3-5 steps with a railing? : Total 6 Click Score: 10    End of Session Equipment Utilized During Treatment: Oxygen Activity Tolerance: Patient tolerated treatment well Patient left: in bed;with call bell/phone within reach;with nursing/sitter in room (B heels elevated via pillow) Nurse Communication: Mobility status;Precautions (Pt's BP status) PT Visit Diagnosis: Muscle weakness (generalized) (M62.81);Other abnormalities of gait and mobility (R26.89)     Time: 4782-9562 PT Time Calculation (min) (ACUTE ONLY): 25 min  Charges:  $Therapeutic Exercise: 8-22 mins $Therapeutic Activity: 8-22 mins                    G CodesLeitha Bleak, PT 06/14/17, 5:04 PM [phone]

## 2017-06-15 DIAGNOSIS — E2740 Unspecified adrenocortical insufficiency: Secondary | ICD-10-CM

## 2017-06-15 LAB — BASIC METABOLIC PANEL
ANION GAP: 5 (ref 5–15)
BUN: 10 mg/dL (ref 6–20)
CALCIUM: 8.1 mg/dL — AB (ref 8.9–10.3)
CO2: 26 mmol/L (ref 22–32)
Chloride: 103 mmol/L (ref 101–111)
Creatinine, Ser: 0.59 mg/dL (ref 0.44–1.00)
GLUCOSE: 152 mg/dL — AB (ref 65–99)
POTASSIUM: 4.5 mmol/L (ref 3.5–5.1)
Sodium: 134 mmol/L — ABNORMAL LOW (ref 135–145)

## 2017-06-15 LAB — CBC WITH DIFFERENTIAL/PLATELET
BASOS PCT: 1 %
Basophils Absolute: 0 10*3/uL (ref 0–0.1)
Eosinophils Absolute: 0 10*3/uL (ref 0–0.7)
Eosinophils Relative: 0 %
HEMATOCRIT: 23.7 % — AB (ref 35.0–47.0)
Hemoglobin: 8 g/dL — ABNORMAL LOW (ref 12.0–16.0)
LYMPHS PCT: 17 %
Lymphs Abs: 0.7 10*3/uL — ABNORMAL LOW (ref 1.0–3.6)
MCH: 29.1 pg (ref 26.0–34.0)
MCHC: 34 g/dL (ref 32.0–36.0)
MCV: 85.6 fL (ref 80.0–100.0)
Monocytes Absolute: 0.2 10*3/uL (ref 0.2–0.9)
Monocytes Relative: 5 %
NEUTROS ABS: 3.1 10*3/uL (ref 1.4–6.5)
NEUTROS PCT: 77 %
Platelets: 211 10*3/uL (ref 150–440)
RBC: 2.77 MIL/uL — AB (ref 3.80–5.20)
RDW: 22.1 % — ABNORMAL HIGH (ref 11.5–14.5)
WBC: 4 10*3/uL (ref 3.6–11.0)

## 2017-06-15 LAB — GLUCOSE, CAPILLARY
GLUCOSE-CAPILLARY: 151 mg/dL — AB (ref 65–99)
GLUCOSE-CAPILLARY: 123 mg/dL — AB (ref 65–99)
Glucose-Capillary: 125 mg/dL — ABNORMAL HIGH (ref 65–99)

## 2017-06-15 LAB — PHOSPHORUS: PHOSPHORUS: 2.6 mg/dL (ref 2.5–4.6)

## 2017-06-15 MED ORDER — POTASSIUM PHOSPHATES 15 MMOLE/5ML IV SOLN
30.0000 mmol | Freq: Once | INTRAVENOUS | Status: DC
  Filled 2017-06-15: qty 10

## 2017-06-15 MED ORDER — HYDROCORTISONE NA SUCCINATE PF 100 MG IJ SOLR
25.0000 mg | Freq: Two times a day (BID) | INTRAMUSCULAR | Status: DC
  Administered 2017-06-15 – 2017-06-17 (×5): 25 mg via INTRAVENOUS
  Filled 2017-06-15 (×2): qty 2
  Filled 2017-06-15 (×4): qty 0.5

## 2017-06-15 NOTE — Progress Notes (Signed)
Pt rested well during the night and remains alert and oriented.  Pt remained off of levophed all night.  Spiriva not given at 7:00 AM due to tablets not being available.  Pharmacy was notified and information was passed in report to day shift nurse.

## 2017-06-15 NOTE — Progress Notes (Signed)
[facility] at [place] NAME: Sophia Nelson    MR#:  [id number]  DATE OF BIRTH:  10/09/1945  SUBJECTIVE:71 year old female patient now admitted for septic shock, she is not on pressors anymore, feeling better, off Levophed.Denies any complaints.  CHIEF COMPLAINT:   Chief Complaint  Patient presents with  . Loss of Consciousness    unwitnessed - found on floor  . Hypoglycemia   Patient seen and examined on 06/14/2017 Off levophed No pain/sob Feels better.  Her blood pressures from outpatient office reviewed and SBP 110-130  REVIEW OF SYSTEMS:    Review of Systems  Constitutional: Positive for malaise/fatigue. Negative for chills and fever.  HENT: Negative for sore throat.   Eyes: Negative for blurred vision, double vision and pain.  Respiratory: Negative for cough, hemoptysis, shortness of breath and wheezing.   Cardiovascular: Negative for chest pain, palpitations, orthopnea and leg swelling.  Gastrointestinal: Negative for abdominal pain, constipation, diarrhea, heartburn, nausea and vomiting.  Genitourinary: Negative for dysuria and hematuria.  Musculoskeletal: Negative for back pain and joint pain.  Skin: Negative for rash.  Neurological: Positive for weakness. Negative for sensory change, speech change, focal weakness and headaches.  Endo/Heme/Allergies: Does not bruise/bleed easily.  Psychiatric/Behavioral: Negative for depression. The patient is not nervous/anxious.     DRUG ALLERGIES:   Allergies  Allergen Reactions  . Other     Onions(that grow in the yard-pt can eat onions without problems) and dust mite Sneezing, cough, runny nose    VITALS:  Blood pressure (!) 123/58, pulse 88, temperature 98.6 F (37 C), temperature source Oral, resp. rate 14, height 5' 1.5" (1.562 m), weight 62.4 kg (137 lb 9.1 oz), SpO2 94 %.  PHYSICAL EXAMINATION:   Physical Exam  GENERAL:  71 y.o.-year-old patient lying in the bed with no  acute distress.  EYES: Pupils equal, round, reactive to light and accommodation. No scleral icterus. Extraocular muscles intact.  HEENT: Head atraumatic, normocephalic. Oropharynx and nasopharynx clear.  NECK:  Supple, no jugular venous distention. No thyroid enlargement, no tenderness.  LUNGS: Normal breath sounds bilaterally, no wheezing, rales, rhonchi. No use of accessory muscles of respiration.  CARDIOVASCULAR: S1, S2 normal. No murmurs, rubs, or gallops.  ABDOMEN: Soft, nontender, nondistended. Bowel sounds present. No organomegaly or mass.  EXTREMITIES: No cyanosis, clubbing or edema b/l.    NEUROLOGIC: Cranial nerves II through XII are intact. No focal Motor or sensory deficits b/l.   PSYCHIATRIC: The patient is alert and awake SKIN: No obvious rash, lesion, or ulcer.   LABORATORY PANEL:   CBC  Recent Labs Lab 06/15/17 0350  WBC 4.0  HGB 8.0*  HCT 23.7*  PLT 211   ------------------------------------------------------------------------------------------------------------------ Chemistries   Recent Labs Lab 06/11/17 0455  06/13/17 0458  06/15/17 0350  NA 135  < > 138  < > 134*  K 3.9  < > 5.2*  < > 4.5  CL 105  < > 110  < > 103  CO2 26  < > 25  < > 26  GLUCOSE 136*  < > 99  < > 152*  BUN <5*  < > 6  < > 10  CREATININE 0.62  < > 0.44  < > 0.59  CALCIUM 6.4*  < > 7.7*  < > 8.1*  MG  --   < > 1.9  --   --   AST 30  --   --   --   --   ALT 24  --   --   --   --  ALKPHOS 117  --   --   --   --   BILITOT 0.4  --   --   --   --   < > = values in this interval not displayed. ------------------------------------------------------------------------------------------------------------------  Cardiac Enzymes  Recent Labs Lab 06/08/17 2325  TROPONINI <0.03   ------------------------------------------------------------------------------------------------------------------  RADIOLOGY:  No results found. ASSESSMENT AND PLAN:   This is a 71 y.o. female with a  history of bilateral breast cancer with bone metastasesstatus post mastectomy, diabetes, GERD, hypertension, home O2 dependent COPD, chronic hypocalcemia now being admitted with:  # septic shock secondary to UTI. Cx negative  Stopped antibiotics. antibiotics.  #. AKI  due to septic shock/ATN Resolved  #. Diabetes with hypoglycemia - resolved - Accuchecks  - Held glipizide,blood glucose acceptable. Continue sliding scale insulin with coverage.  #.metastatic breast cancer, chemotherapy on hold, oncology to decide next step oncology consult appreciated, they will see the patient on Monday.chemo on hold because of sepsis, acute infection.ontinue fentanyl patch for pain control,  #. History of asthma with chronic resp failure - Continue Flonase, Singulair, Advair, Spiriva  # Anemia of chronic disease 1 unit PRBC transfused 06/11/2017 Improved and stable  DVT, GI prophylaxis with Lovenox,pepcid.  All the records are reviewed and case discussed with Care Management/Social Workerr. Management plans discussed with the patient, family and they are in agreement.  CODE STATUS: FULL CODE  DVT Prophylaxis: SCDs  TOTAL TIME TAKING CARE OF THIS PATIENT: 35 minutes.   Epifanio Lesches M.D on 06/15/2017 at 12:40 PM  Between 7am to 6pm - Pager - [phone]  After 6pm go to [email or web address] - password [email or web address] [facility]  Office  [phone]  CC: Primary care physician; Donnie Coffin, MD  Note: This dictation was prepared with Dragon dictation along with smaller phrase technology. Any transcriptional errors that result from this process are unintentional.

## 2017-06-15 NOTE — Progress Notes (Signed)
[facility] [place] NAME: Sophia Nelson    MR#:  [id number]  DATE OF BIRTH:  1946/04/23  SUBJECTIVE:  CHIEF COMPLAINT:   Chief Complaint  Patient presents with  . Loss of Consciousness    unwitnessed - found on floor  . Hypoglycemia   Patient seen and examined on 06/14/2017 Off levophed No pain/sob Feels better.  Her blood pressures from outpatient office reviewed and SBP 110-130  REVIEW OF SYSTEMS:    Review of Systems  Constitutional: Positive for malaise/fatigue. Negative for chills and fever.  HENT: Negative for sore throat.   Eyes: Negative for blurred vision, double vision and pain.  Respiratory: Negative for cough, hemoptysis, shortness of breath and wheezing.   Cardiovascular: Negative for chest pain, palpitations, orthopnea and leg swelling.  Gastrointestinal: Negative for abdominal pain, constipation, diarrhea, heartburn, nausea and vomiting.  Genitourinary: Negative for dysuria and hematuria.  Musculoskeletal: Negative for back pain and joint pain.  Skin: Negative for rash.  Neurological: Positive for weakness. Negative for sensory change, speech change, focal weakness and headaches.  Endo/Heme/Allergies: Does not bruise/bleed easily.  Psychiatric/Behavioral: Negative for depression. The patient is not nervous/anxious.     DRUG ALLERGIES:   Allergies  Allergen Reactions  . Other     Onions(that grow in the yard-pt can eat onions without problems) and dust mite Sneezing, cough, runny nose    VITALS:  Blood pressure 114/64, pulse 86, temperature 98.5 F (36.9 C), temperature source Oral, resp. rate 13, height 5' 1.5" (1.562 m), weight 62.4 kg (137 lb 9.1 oz), SpO2 100 %.  PHYSICAL EXAMINATION:   Physical Exam  GENERAL:  71 y.o.-year-old patient lying in the bed with no acute distress.  EYES: Pupils equal, round, reactive to light and accommodation. No scleral icterus. Extraocular muscles intact.  HEENT: Head  atraumatic, normocephalic. Oropharynx and nasopharynx clear.  NECK:  Supple, no jugular venous distention. No thyroid enlargement, no tenderness.  LUNGS: Normal breath sounds bilaterally, no wheezing, rales, rhonchi. No use of accessory muscles of respiration.  CARDIOVASCULAR: S1, S2 normal. No murmurs, rubs, or gallops.  ABDOMEN: Soft, nontender, nondistended. Bowel sounds present. No organomegaly or mass.  EXTREMITIES: No cyanosis, clubbing or edema b/l.    NEUROLOGIC: Cranial nerves II through XII are intact. No focal Motor or sensory deficits b/l.   PSYCHIATRIC: The patient is alert and awake SKIN: No obvious rash, lesion, or ulcer.   LABORATORY PANEL:   CBC  Recent Labs Lab 06/15/17 0350  WBC 4.0  HGB 8.0*  HCT 23.7*  PLT 211   ------------------------------------------------------------------------------------------------------------------ Chemistries   Recent Labs Lab 06/11/17 0455  06/13/17 0458  06/15/17 0350  NA 135  < > 138  < > 134*  K 3.9  < > 5.2*  < > 4.5  CL 105  < > 110  < > 103  CO2 26  < > 25  < > 26  GLUCOSE 136*  < > 99  < > 152*  BUN <5*  < > 6  < > 10  CREATININE 0.62  < > 0.44  < > 0.59  CALCIUM 6.4*  < > 7.7*  < > 8.1*  MG  --   < > 1.9  --   --   AST 30  --   --   --   --   ALT 24  --   --   --   --   ALKPHOS 117  --   --   --   --  BILITOT 0.4  --   --   --   --   < > = values in this interval not displayed. ------------------------------------------------------------------------------------------------------------------  Cardiac Enzymes  Recent Labs Lab 06/08/17 2325  TROPONINI <0.03   ------------------------------------------------------------------------------------------------------------------  RADIOLOGY:  No results found. ASSESSMENT AND PLAN:   This is a 71 y.o. female with a history of bilateral breast cancer with bone metastasesstatus post mastectomy, diabetes, GERD, hypertension, home O2 dependent COPD, chronic  hypocalcemia now being admitted with:  # septic shock secondary to UTI. Cx negative  On IV ceftriaxone Still on Levophed Bcx not drawn on admission  #. AKI  due to septic shock/ATN Resolved  #. Diabetes with hypoglycemia - resolved - Accuchecks  - Held glipizide  #. History of breast cancer - Continue abemaciclib  #. History of asthma with chronic resp failure - Continue Flonase, Singulair, Advair, Spiriva  # Anemia of chronic disease 1 unit PRBC transfused 06/11/2017 Improved and stable   All the records are reviewed and case discussed with Care Management/Social Workerr. Management plans discussed with the patient, family and they are in agreement.  CODE STATUS: FULL CODE  DVT Prophylaxis: SCDs  TOTAL TIME TAKING CARE OF THIS PATIENT: 35 minutes.   Hillary Bow R M.D on 06/15/2017 at 8:09 AM  Between 7am to 6pm - Pager - [phone]  After 6pm go to [email or web address] - password [email or web address] [facility]  Office  [phone]  CC: Primary care physician; Donnie Coffin, MD  Note: This dictation was prepared with Dragon dictation along with smaller phrase technology. Any transcriptional errors that result from this process are unintentional.

## 2017-06-15 NOTE — Progress Notes (Signed)
Rn notified Dr Alva Garnet that patient is asking regarding her chemo med, Abemaciclib, note shows that Oncologist d/c med on Wednesday due to acute illness.  Per Dr Alva Garnet,  but Oncologist needs to make that determination, however, the issue is not an emergency and can wait for Oncology to address on Monday.

## 2017-06-15 NOTE — Progress Notes (Signed)
PULMONARY / CRITICAL CARE MEDICINE   Name: Sophia Nelson MRN: [id number] DOB: 1946/01/31    ADMISSION DATE:  06/08/2017  PT PROFILE:   56 F with history of metastatic breast cancer, DM admitted via ED with syncope, multiple electrolyte abnormalities, hypotension. CT head 10/14: no acute findings  MAJOR EVENTS/TEST RESULTS: 10/13 Admission 10/14 CT head: NAD 10/15 Weaning vasopressors 10/16 Anemic, transfused 1 unit of PRBCs; oncology consulted 10/17 Levophed gtt restarted  10/18 Levophed gtt restarted for brief period overnight  10/19 OFF LEVOPHED 10/20 Transfer out of ICU  INDWELLING DEVICES: None   MICRO DATA: MRSA PCR 0/14 >>NEG Urine 10/13 >>NEG  ANTIMICROBIALS:  Metronidazole 10/14 >> 10/15 Ceftriaxone 10/14 >> 10/20  SUBJECTIVE:  No acute issues overnight. Off pressors for ~24hrs   VITAL SIGNS: BP 114/64   Pulse 86   Temp 98.5 F (36.9 C) (Oral)   Resp 13   Ht 5' 1.5" (1.562 m)   Wt 137 lb 9.1 oz (62.4 kg)   SpO2 100%   BMI 25.57 kg/m   HEMODYNAMICS:    VENTILATOR SETTINGS:    INTAKE / OUTPUT: I/O last 3 completed shifts: In: 1896.5 [P.O.:120; I.V.:1676.5; IV Piggyback:100] Out: 1890 [Urine:1890]  PHYSICAL EXAMINATION: General: frail appearing, NAD Neuro: AAO X3, no focal deficits HEENT: NCAT, sclerae white Cardiovascular: nsr, s1s2, no M/R/G Lungs: clear anteriorly, diminished in the bases Abdomen: hypoactive BS x4, soft, non tender, mildly distended  Ext: cool, no edema  LABS:  BMET  Recent Labs Lab 06/13/17 0458 06/14/17 0504 06/15/17 0350  NA 138 137 134*  K 5.2* 5.1 4.5  CL 110 106 103  CO2 25 27 26   BUN 6 7 10   CREATININE 0.44 0.58 0.59  GLUCOSE 99 118* 152*    Electrolytes  Recent Labs Lab 06/10/17 0411  06/12/17 0434 06/13/17 0458 06/14/17 0504 06/15/17 0350  CALCIUM 6.3*  < > 6.9* 7.7* 7.8* 8.1*  MG 1.7  --  1.4* 1.9  --   --   PHOS 2.3*  --  1.7* 2.4*  --  2.6  < > = values in this interval not  displayed.  CBC  Recent Labs Lab 06/13/17 0458 06/14/17 0504 06/15/17 0350  WBC 3.1* 4.0 4.0  HGB 8.8* 8.5* 8.0*  HCT 26.2* 25.7* 23.7*  PLT 157 172 211    Coag's No results for input(s): APTT, INR in the last 168 hours.  Sepsis Markers  Recent Labs Lab 06/08/17 2325 06/09/17 1441 06/10/17 0411 06/11/17 0455  LATICACIDVEN 1.3 1.1  --   --   PROCALCITON  --  1.51 0.70 0.33    ABG No results for input(s): PHART, PCO2ART, PO2ART in the last 168 hours.  Liver Enzymes  Recent Labs Lab 06/09/17 1441 06/10/17 0011 06/10/17 0411 06/11/17 0455  AST 73* 48*  --  30  ALT 38 32  --  24  ALKPHOS 147* 127*  --  117  BILITOT 0.9 0.5  --  0.4  ALBUMIN 1.9* 1.8* 1.9* 1.6*    Cardiac Enzymes  Recent Labs Lab 06/08/17 2325  TROPONINI <0.03    Glucose  Recent Labs Lab 06/14/17 0754 06/14/17 1135 06/14/17 1557 06/14/17 2006 06/15/17 0008 06/15/17 0351  GLUCAP 91 148* 177* 160* 125* 151*   No results found.  ASSESSMENT Metastatic breast cancer Recent laparotomy for gastric outlet obstruction due to breast cancer metastasis Syncope Hypotension-will r/o adrenal insufficiency   Possible UTI although urine culture negative, however UA positive  Chronic hypocalcemia Hypomagnesia  Chronic anemia  Mildly elevated PCT-improved  Pleural effusion Anemia-likely secondary to malignancy-improved    PLAN Oncology and Palliative consulted appreciate input  Continue Chemotherapy per oncology recommendations  Maintain map >60  Cosyntropin sim testing completed-base 16.9, 30 mins 9.8, 60 mins 17.8 Continue Ceftriaxone till 10/24 Trend BMP  Replace electrolytes as indicated  Monitor UOP Trend CBC Monitor for s/sx of bleeding  Transfuse for hgb <7 Lovenox for VTE prophylaxis Prn morphine and duragesic patch for pain management  PT consult placed-frequent OOB to chair  Prn milk of magnesium for constipation   -Patient has been off pressors all night. Ok to  transfers out of the ICU  Magdalene S. Tukov ANP-BC Pulmonary and [place] Pager [phone] or [phone]  PCCM ATTENDING ATTESTATION: I have evaluated patient with the APP Patria Mane, reviewed database in its entirety and discussed care plan in detail. In addition, this patient was discussed on multidisciplinary rounds. I agree with the above findings, assessment and plan  It is difficult to interpret her ACTH stim test with any definitiveness. Her response to ACTH seems to fall into the "borderline" range. Her BP seems to have responded to empiric hydrocortisone.   I have decreased hydrocortisone to 25 mg IV BID. Suggest slow taper to off and consider repeat ACTH stim test and/or endocrinology input if hypotension recurs.  She is stable for transfer to med-surg floor.  After transfer, PCCM will sign off. Please call if we can be of further assistance   Merton Border, MD PCCM service Mobile [phone] Pager [phone] 06/15/2017 11:16 AM

## 2017-06-16 LAB — PHOSPHORUS: PHOSPHORUS: 2 mg/dL — AB (ref 2.5–4.6)

## 2017-06-16 LAB — BASIC METABOLIC PANEL
ANION GAP: 4 — AB (ref 5–15)
BUN: 11 mg/dL (ref 6–20)
CHLORIDE: 103 mmol/L (ref 101–111)
CO2: 28 mmol/L (ref 22–32)
Calcium: 8.2 mg/dL — ABNORMAL LOW (ref 8.9–10.3)
Creatinine, Ser: 0.69 mg/dL (ref 0.44–1.00)
GFR calc Af Amer: >60 mL/min (ref >60)
GFR calc non Af Amer: >60 mL/min (ref >60)
GLUCOSE: 125 mg/dL — AB (ref 65–99)
POTASSIUM: 3.9 mmol/L (ref 3.5–5.1)
Sodium: 135 mmol/L (ref 135–145)

## 2017-06-16 LAB — MAGNESIUM: MAGNESIUM: 1.4 mg/dL — AB (ref 1.7–2.4)

## 2017-06-16 MED ORDER — SODIUM PHOSPHATES 45 MMOLE/15ML IV SOLN
30.0000 mmol | Freq: Once | INTRAVENOUS | Status: AC
  Administered 2017-06-16: 30 mmol via INTRAVENOUS
  Filled 2017-06-16: qty 10

## 2017-06-16 MED ORDER — MAGNESIUM SULFATE 4 GM/100ML IV SOLN
4.0000 g | Freq: Once | INTRAVENOUS | Status: AC
  Administered 2017-06-16: 13:00:00 4 g via INTRAVENOUS
  Filled 2017-06-16 (×2): qty 100

## 2017-06-16 NOTE — NC FL2 (Signed)
[place] LEVEL OF CARE SCREENING TOOL     IDENTIFICATION  Patient Name: Sophia Nelson Birthdate: 14-Oct-1945 Sex: female Admission Date (Current Location): 06/08/2017  [place] and [place] Number:  Engineering geologist and Address:  [facility], [address] [place], [place] [address]      Provider Number: [id number]  Attending Physician Name and Address:  Epifanio Lesches, MD  Relative Name and Phone Number:  Blanchard Kelch (son) [phone]    Current Level of Care: Hospital Recommended Level of Care: [place] Prior Approval Number:    Date Approved/Denied:   PASRR Number:    Discharge Plan: SNF    Current Diagnoses: Patient Active Problem List   Diagnosis Date Noted  . Syncope   . Metastatic breast cancer ([place])   . Hypotension   . Palliative care by specialist   . Goals of care, counseling/discussion   . Severe sepsis with septic shock ([place]) 06/09/2017  . Hypocalcemia 05/30/2017  . Metastasis to bone ([place]) 04/30/2017  . Acute back pain 03/28/2017  . Back pain, chronic 03/15/2017  . Chronic lumbar pain 03/15/2017  . Recurrent low back pain 03/15/2017  . Duodenal obstruction   . Pancreatitis, acute 03/04/2017  . Abdominal distention   . Encounter for nasogastric (NG) tube placement   . Gastric outlet obstruction 02/24/2017  . AKI (acute kidney injury) ([place]) 02/24/2017  . GERD (gastroesophageal reflux disease) 02/24/2017  . Diabetes ([place]) 02/24/2017  . COPD (chronic obstructive pulmonary disease) ([place]) 02/24/2017  . Neck pain 04/19/2016  . Carcinoma of overlapping sites of left breast in female, estrogen receptor positive ([place]) 03/22/2016    Orientation RESPIRATION BLADDER Height & Weight     Self, Time, Situation  Normal Continent Weight: 140 lb 8 oz (63.7 kg) Height:  5\' 1"  (154.9 cm)  BEHAVIORAL SYMPTOMS/MOOD NEUROLOGICAL BOWEL NUTRITION STATUS      Continent Diet (carb modified)   AMBULATORY STATUS COMMUNICATION OF NEEDS Skin   Extensive Assist Verbally Normal                       Personal Care Assistance Level of Assistance  Bathing, Feeding, Dressing Bathing Assistance: Limited assistance Feeding assistance: Independent Dressing Assistance: Limited assistance     Functional Limitations Info             SPECIAL CARE FACTORS FREQUENCY  PT (By licensed PT)     PT Frequency: Up to 5X per day, 5 days per week              Contractures Contractures Info: Not present    Additional Factors Info  Code Status, Allergies Code Status Info: Full Allergies Info: Onions(that grow in the yard-pt can eat onions without problems) and dust mite           Current Medications (06/16/2017):  This is the current hospital active medication list Current Facility-Administered Medications  Medication Dose Route Frequency Provider Last Rate Last Dose  . acetaminophen (TYLENOL) tablet 650 mg  650 mg Oral Q6H PRN Hugelmeyer, Alexis, DO   650 mg at 06/09/17 0526  . albuterol (PROVENTIL) (2.5 MG/3ML) 0.083% nebulizer solution 2.5 mg  2.5 mg Nebulization Q6H PRN Hugelmeyer, Alexis, DO      . aspirin EC tablet 81 mg  81 mg Oral Daily Hugelmeyer, Alexis, DO   81 mg at 06/16/17 1012  . atorvastatin (LIPITOR) tablet 10 mg  10 mg Oral Daily Hugelmeyer, Alexis, DO   10  mg at 06/15/17 1658  . bisacodyl (DULCOLAX) EC tablet 5 mg  5 mg Oral Daily PRN Hugelmeyer, Alexis, DO   5 mg at 06/09/17 0526  . docusate sodium (COLACE) capsule 100 mg  100 mg Oral BID Hugelmeyer, Alexis, DO   100 mg at 06/16/17 1012  . enoxaparin (LOVENOX) injection 40 mg  40 mg Subcutaneous Q24H Wilhelmina Mcardle, MD   40 mg at 06/15/17 1907  . feeding supplement (ENSURE ENLIVE) (ENSURE ENLIVE) liquid 237 mL  237 mL Oral TID BM Hugelmeyer, Alexis, DO   237 mL at 06/15/17 1912  . fentaNYL (DURAGESIC - dosed mcg/hr) patch 25 mcg  25 mcg Transdermal Q72H Wilhelmina Mcardle, MD   25 mcg at 06/14/17 1507  .  guaiFENesin (MUCINEX) 12 hr tablet 600 mg  600 mg Oral BID PRN Hugelmeyer, Alexis, DO      . HYDROcodone-acetaminophen (NORCO/VICODIN) 5-325 MG per tablet 1-2 tablet  1-2 tablet Oral Q4H PRN Hugelmeyer, Alexis, DO   2 tablet at 06/09/17 2008  . hydrocortisone sodium succinate (SOLU-CORTEF) 100 MG injection 25 mg  25 mg Intravenous Q12H Wilhelmina Mcardle, MD   25 mg at 06/16/17 1013  . loperamide (IMODIUM) capsule 2 mg  2 mg Oral PRN Hugelmeyer, Alexis, DO      . loratadine (CLARITIN) tablet 10 mg  10 mg Oral Daily Hugelmeyer, Alexis, DO   10 mg at 06/16/17 1012  . magnesium hydroxide (MILK OF MAGNESIA) suspension 5 mL  5 mL Oral Daily PRN Awilda Bill, NP      . magnesium sulfate IVPB 4 g 100 mL  4 g Intravenous Once Tukov, Magadalene S, NP      . mometasone-formoterol (DULERA) 200-5 MCG/ACT inhaler 2 puff  2 puff Inhalation BID Hugelmeyer, Alexis, DO   2 puff at 06/16/17 1012  . montelukast (SINGULAIR) tablet 10 mg  10 mg Oral QHS Dorene Sorrow S, NP   10 mg at 06/15/17 2120  . morphine 2 MG/ML injection 1-2 mg  1-2 mg Intravenous Q4H PRN Awilda Bill, NP   2 mg at 06/13/17 1029  . ondansetron (ZOFRAN) tablet 4 mg  4 mg Oral Q6H PRN Hugelmeyer, Alexis, DO       Or  . ondansetron (ZOFRAN) injection 4 mg  4 mg Intravenous Q6H PRN Hugelmeyer, Alexis, DO   4 mg at 06/14/17 1121  . oxyCODONE-acetaminophen (PERCOCET/ROXICET) 5-325 MG per tablet 1 tablet  1 tablet Oral Q8H PRN Hugelmeyer, Alexis, DO   1 tablet at 06/15/17 2120  . pantoprazole (PROTONIX) EC tablet 40 mg  40 mg Oral Daily Hugelmeyer, Alexis, DO   40 mg at 06/16/17 1012  . senna-docusate (Senokot-S) tablet 1 tablet  1 tablet Oral QHS PRN Hugelmeyer, Alexis, DO      . sodium chloride flush (NS) 0.9 % injection 3 mL  3 mL Intravenous Q12H Hugelmeyer, Alexis, DO   3 mL at 06/16/17 1014  . sodium phosphate 30 mmol in dextrose 5 % 250 mL infusion  30 mmol Intravenous Once Mikael Spray, NP 43 mL/hr at 06/16/17 0642 30 mmol at  06/16/17 0642  . tiotropium [facility]) inhalation capsule 18 mcg  18 mcg Inhalation BH-q7a Hugelmeyer, Alexis, DO   18 mcg at 06/16/17 0616  . tiZANidine (ZANAFLEX) tablet 2 mg  2 mg Oral BID Hugelmeyer, Alexis, DO   2 mg at 06/16/17 1012  . Vitamin D (Ergocalciferol) (DRISDOL) capsule 50,000 Units  50,000 Units Oral Q7 days Wilhelmina Mcardle, MD  50,000 Units at 06/13/17 1000     Discharge Medications: Please see discharge summary for a list of discharge medications.  Relevant Imaging Results:  Relevant Lab Results:   Additional Information SS# [id number]  Zettie Pho, LCSW

## 2017-06-16 NOTE — Progress Notes (Addendum)
[place] [facility] [place] NAME: Sophia Nelson    MR#:  [id number]  DATE OF BIRTH:  1946/06/11  SUBJECTIVE: deniesany shortness of breath or chest pain. No dysuria  .[name] Denies any complaints.  CHIEF COMPLAINT:   Chief Complaint  Patient presents with  . Loss of Consciousness    unwitnessed - found on floor  . Hypoglycemia   Admitted for septic shock.  REVIEW OF SYSTEMS:    Review of Systems  Constitutional: Negative for chills, fever and malaise/fatigue.  HENT: Negative for sore throat.   Eyes: Negative for blurred vision, double vision and pain.  Respiratory: Negative for cough, hemoptysis, shortness of breath and wheezing.   Cardiovascular: Negative for chest pain, palpitations, orthopnea and leg swelling.  Gastrointestinal: Negative for abdominal pain, constipation, diarrhea, heartburn, nausea and vomiting.  Genitourinary: Negative for dysuria and hematuria.  Musculoskeletal: Negative for back pain and joint pain.  Skin: Negative for rash.  Neurological: Negative for sensory change, speech change, focal weakness, weakness and headaches.  Endo/Heme/Allergies: Does not bruise/bleed easily.  Psychiatric/Behavioral: Negative for depression. The patient is not nervous/anxious.     DRUG ALLERGIES:   Allergies  Allergen Reactions  . Other     Onions(that grow in the yard-pt can eat onions without problems) and dust mite Sneezing, cough, runny nose    VITALS:  Blood pressure (!) 126/52, pulse 86, temperature 97.9 F (36.6 C), temperature source Oral, resp. rate 17, height 5\' 1"  (1.549 m), weight 63.7 kg (140 lb 8 oz), SpO2 97 %.  PHYSICAL EXAMINATION:   Physical Exam  GENERAL:  71 y.o.-year-old patient lying in the bed with no acute distress.  EYES: Pupils equal, round, reactive to light . No scleral icterus. Extraocular muscles intact.  HEENT: Head atraumatic, normocephalic. Oropharynx and nasopharynx clear.  NECK:  Supple, no jugular  venous distention. No thyroid enlargement, no tenderness.  LUNGS: Normal breath sounds bilaterally, no wheezing, rales, rhonchi. No use of accessory muscles of respiration.  CARDIOVASCULAR: S1, S2 normal. No murmurs, rubs, or gallops.  ABDOMEN: Soft, nontender, nondistended. Bowel sounds present. No organomegaly or mass.  EXTREMITIES: No cyanosis, clubbing or edema b/l.    NEUROLOGIC: Cranial nerves II through XII are intact. No focal Motor or sensory deficits b/l.   PSYCHIATRIC: The patient is alert and awake SKIN: No obvious rash, lesion, or ulcer.   LABORATORY PANEL:   CBC  Recent Labs Lab 06/15/17 0350  WBC 4.0  HGB 8.0*  HCT 23.7*  PLT 211   ------------------------------------------------------------------------------------------------------------------ Chemistries   Recent Labs Lab 06/11/17 0455  06/16/17 0502  NA 135  < > 135  K 3.9  < > 3.9  CL 105  < > 103  CO2 26  < > 28  GLUCOSE 136*  < > 125*  BUN <5*  < > 11  CREATININE 0.62  < > 0.69  CALCIUM 6.4*  < > 8.2*  MG  --   < > 1.4*  AST 30  --   --   ALT 24  --   --   ALKPHOS 117  --   --   BILITOT 0.4  --   --   < > = values in this interval not displayed. ------------------------------------------------------------------------------------------------------------------  Cardiac Enzymes No results for input(s): TROPONINI in the last 168 hours. ------------------------------------------------------------------------------------------------------------------  RADIOLOGY:  No results found. ASSESSMENT AND PLAN:   This is a 71 y.o. female with a history of bilateral breast cancer with bone metastasesstatus  post mastectomy, diabetes, GERD, hypertension, home O2 dependent COPD, chronic hypocalcemia now being admitted with:  # septic shock secondary to UTI. Cx negative  Stopped antibiotics. Antibiotics. likely had cystitis. #. AKI  due to septic shock/ATN Resolved.d/c iv fluids.likley d/c home am. Please  feel fluids, likely discharge home tomorrow.  #. Diabetes with hypoglycemia - resolved - Accuchecks  - Held glipizide,blood glucose acceptable. Continue sliding scale insulin with coverage. Encourage po intake and see if we can resume glipizide at discharge.  #.metastatic breast cancer, chemotherapy on hold, oncology to decide next step oncology consult appreciated, they will see the patient on Monday.chemo on hold because of sepsis, acute infection.ontinue fentanyl patch for pain control,  #. History of asthma with chronic resp failure - Continue Flonase, Singulair, Advair, Spiriva,on 2 litres of o2.at home,  # Anemia of chronic disease 1 unit PRBC transfused 06/11/2017 Improved and stable  DVT, GI prophylaxis with Lovenox,pepcid. Physical therapy recommends SNF.l nursing ,but patient is able to walk to bathroom now, evaluation by physical therapy tomorrow, she wants to go home with home health.  All the records are reviewed and case discussed with Care Management/Social Workerr. Management plans discussed with the patient, family and they are in agreement.  CODE STATUS: FULL CODE  DVT Prophylaxis: SCDs  TOTAL TIME TAKING CARE OF THIS PATIENT: 35 minutes.   Epifanio Lesches M.D on 06/16/2017 at 8:17 AM  Between 7am to 6pm - Pager - [phone]  After 6pm go to [email or web address] - password [email or web address] [place] [facility]  Office  [phone]  CC: Primary care physician; Donnie Coffin, MD  Note: This dictation was prepared with Dragon dictation along with smaller phrase technology. Any transcriptional errors that result from this process are unintentional.

## 2017-06-16 NOTE — Progress Notes (Signed)
Magnesium sulfate and sodium phosphate ordered to be replaced.  Replacement bags will be sent to room 107 when they arrive.  Nicki, rn is aware of order.

## 2017-06-16 NOTE — Progress Notes (Addendum)
Pt rested well during the night.  Pt remains on 2L nasal cannula and remains sinus rhythm on the cardiac monitor. Lungs are clear.  Pt has right IJ triple lumen PICC in place, along with a R. 20g. EJ, all saline locked.  Per pt request, please do not discontinue lines.  Pt "does not want to be stuck again." Pt is being moved to room 107 via wheelchair.

## 2017-06-17 DIAGNOSIS — Z86000 Personal history of in-situ neoplasm of breast: Secondary | ICD-10-CM

## 2017-06-17 DIAGNOSIS — Z87440 Personal history of urinary (tract) infections: Secondary | ICD-10-CM

## 2017-06-17 DIAGNOSIS — C7951 Secondary malignant neoplasm of bone: Secondary | ICD-10-CM

## 2017-06-17 MED ORDER — FENTANYL 25 MCG/HR TD PT72: 25.0000 ug | MEDICATED_PATCH | TRANSDERMAL | 0 refills | Status: DC

## 2017-06-17 MED ORDER — ACETAMINOPHEN 325 MG PO TABS: 325.0000 mg | ORAL_TABLET | Freq: Four times a day (QID) | ORAL | Status: DC | PRN

## 2017-06-17 MED ORDER — PREDNISONE 20 MG PO TABS: 20.0000 mg | ORAL_TABLET | Freq: Every day | ORAL | 0 refills | Status: AC

## 2017-06-17 MED ORDER — GUAIFENESIN ER 600 MG PO TB12: 600.0000 mg | ORAL_TABLET | Freq: Two times a day (BID) | ORAL | Status: DC | PRN

## 2017-06-17 MED ORDER — SENNOSIDES-DOCUSATE SODIUM 8.6-50 MG PO TABS: 1.0000 | ORAL_TABLET | Freq: Every evening | ORAL | Status: AC | PRN

## 2017-06-17 NOTE — Care Management Important Message (Signed)
Important Message  Patient Details  Name: Sophia Nelson MRN: [id number] Date of Birth: 10-05-1945   Medicare Important Message Given:  Yes    Shelbie Ammons, RN 06/17/2017, 8:14 AM

## 2017-06-17 NOTE — Progress Notes (Signed)
pnt rested overnight and appears comfortable. No issues or cocnerns.

## 2017-06-17 NOTE — Progress Notes (Signed)
Sophia Nelson   DOB:Sep 05, 1945   OI#:[id number]    Subjective: Patient denies any pain or shortness of breath. She is currently off the vasopressors. She is on the regular floor. She is walking by herself. She is interested in going home.    Objective:  Vitals:   06/17/17 0354 06/17/17 1322  BP: 123/62 122/60  Pulse: 84   Resp: 20   Temp: 98.3 F (36.8 C)   SpO2: 99% 100%     Intake/Output Summary (Last 24 hours) at 06/17/17 1933 Last data filed at 06/17/17 1033  Gross per 24 hour  Intake              240 ml  Output                0 ml  Net              240 ml    GENERAL: Well-nourished well-developed; Alert, no distress and comfortable.   Alone. EYES: no pallor or icterus OROPHARYNX: no thrush or ulceration. NECK: supple, no masses felt LYMPH:  no palpable lymphadenopathy in the cervical, axillary or inguinal regions LUNGS: decreased breath sounds to auscultation at bases and  No wheeze or crackles HEART/CVS: regular rate & rhythm and no murmurs; No lower extremity edema ABDOMEN: abdomen soft, non-tender and normal bowel sounds Musculoskeletal:no cyanosis of digits and no clubbing  PSYCH: alert & oriented x 3 with fluent speech NEURO: no focal motor/sensory deficits SKIN:  no rashes or significant lesions    Labs:  Lab Results  Component Value Date   WBC 4.0 06/15/2017   HGB 8.0 (L) 06/15/2017   HCT 23.7 (L) 06/15/2017   MCV 85.6 06/15/2017   PLT 211 06/15/2017   NEUTROABS 3.1 06/15/2017    Lab Results  Component Value Date   NA 135 06/16/2017   K 3.9 06/16/2017   CL 103 06/16/2017   CO2 28 06/16/2017    Studies:  No results found.  Assessment & Plan:   71 year old female patient with a history of metastatic lobular cancer currently admitted to hospital for sepsis/hypotension.    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 On Faslodex plus abema.  Hold abema sec to acute illness/severe anemia. We'll plan to restart the patient on Abema at next visit/in approximately 10 days.   # Multiple electrolyte abnormalities including-hypocalcemia/hypomagnesemia hypophosphatemia- suspect poor nutrition; and also Xgeva. Improvement noted. Recommend continued intake of calcium plus vitamin D.   # Hypotension/sepsis-question UTI currently resolved/off vasopressors.  # Patient will follow-up with me in approximately 10 days/Faslodex possibly restarting on Abema. Discussed with pt/she agrees. Discussed with Dr.Goruru.   [name], MD 06/17/2017  7:33 PM

## 2017-06-17 NOTE — Progress Notes (Signed)
Discharge instructions given and went over with patient at bedside. Prescription given and reviewed. Patient refusing SNF, home health, and equipment. All questions answered. Patient discharged home with family via wheelchair by nursing staff. Madlyn Frankel, RN

## 2017-06-17 NOTE — Clinical Social Work Note (Signed)
CSW received referral for SNF.  Case discussed with case manager and plan is to discharge home.  CSW to sign off please re-consult if social work needs arise.  Aura Bibby R. Lesly Pontarelli, MSW, LCSWA [phone]  

## 2017-06-17 NOTE — Progress Notes (Signed)
Physical Therapy Treatment Patient Details Name: Sophia Nelson MRN: [id number] DOB: 22-Oct-1945 Today's Date: 06/17/2017    History of Present Illness Pt is a 71 y.o. F with a PMH including AKI, anemia, breast cancer, COPD, DM, GERD, headaches, HTN, and SOB dyspnea. Presents to ED on 10/13 s/p fall with LOC secondary to hypoglycemia, hypotension; admitted with sepsis secondary to UTI. Imaging negative for any acute abnormalties.  Extended ICU stay d/t hypotension and on pressors.    PT Comments    Pt able to progress significantly with bed mobility, transfers, gait and stairs. Bed mobility with supervision for safety; transfers with min guard for safety; able to ambulate 267ft with RW, min guard. Pt able to ascend 5 steps with min guard, but required min guard-min assist x2 for stability while descending. O2 sats and blood pressure monitored throughout session. O2 sats 100% seated EOB at beginning of session, 92% after ambulating 17ft, 95% seated end of session all on 2L [place]. BP seated beginning of session 122/60, seated end of session 118/56; no c/o lightheadedness or dizziness throughout session. Pt states son will be available to help her climb the 5 steps into her home upon d/c; she is able to ambulate well over household distances with only c/o fatigue; PT educated pt on pacing herself with activities within her home and safe use of RW for static and dynamic balance support. With regards to current mobility status and available help at home, PT recommends d/c to home with HHPT.  Follow Up Recommendations  Home health PT     Equipment Recommendations  Rolling walker with 5" wheels    Recommendations for Other Services       Precautions / Restrictions Precautions Precautions: Fall Restrictions Weight Bearing Restrictions: No    Mobility  Bed Mobility Overal bed mobility: Needs Assistance Bed Mobility: Supine to Sit     Supine to sit: Supervision     General bed mobility  comments: supervision for safety, but not required. Able to move supine to sitting on EOB with bed flat, no difficulties noted  Transfers Overall transfer level: Needs assistance Equipment used: Rolling walker (2 wheeled) Transfers: Sit to/from Stand Sit to Stand: Min guard         General transfer comment: min guard for safety; no difficulties noted  Ambulation/Gait Ambulation/Gait assistance: Min guard Ambulation Distance (Feet): 210 Feet Assistive device: Rolling walker (2 wheeled)       General Gait Details: short steps; decreased gait speed intially, which decreased even more d/t fatigue with increased ambulation distance/after doing stairs   Stairs Stairs: Yes   Stair Management: One rail Right;One rail Left;Forwards (handheld assist) Number of Stairs: 5 General stair comments: ascent with railing on R, descent with railing on L; handheld assist; min guard during ascent, min guard - min assist during descent x2 for LLE stability and balance support  Wheelchair Mobility    Modified Rankin (Stroke Patients Only)       Balance Overall balance assessment: Needs assistance Sitting-balance support: Feet supported Sitting balance-Leahy Scale: Good     Standing balance support: Bilateral upper extremity supported;During functional activity Standing balance-Leahy Scale: Fair Standing balance comment: BUE supported on RW, min assist for balance/stability support while descending stairs                            Cognition Arousal/Alertness: Awake/alert Behavior During Therapy: WFL for tasks assessed/performed Overall Cognitive Status: Within Functional Limits for tasks  assessed                                        Exercises      General Comments        Pertinent Vitals/Pain Pain Assessment: No/denies pain    Home Living                      Prior Function            PT Goals (current goals can now be found in  the care plan section) Acute Rehab PT Goals Patient Stated Goal: to be able to go home PT Goal Formulation: With patient Time For Goal Achievement: 06/24/17 Potential to Achieve Goals: Good Progress towards PT goals: Progressing toward goals    Frequency    Min 2X/week      PT Plan Discharge plan needs to be updated    Co-evaluation              AM-PAC PT "6 Clicks" Daily Activity  Outcome Measure  Difficulty turning over in bed (including adjusting bedclothes, sheets and blankets)?: None Difficulty moving from lying on back to sitting on the side of the bed? : None Difficulty sitting down on and standing up from a chair with arms (e.g., wheelchair, bedside commode, etc,.)?: None Help needed moving to and from a bed to chair (including a wheelchair)?: A Little Help needed walking in hospital room?: A Little Help needed climbing 3-5 steps with a railing? : A Lot 6 Click Score: 20    End of Session Equipment Utilized During Treatment: Gait belt;Oxygen Activity Tolerance: Patient tolerated treatment well Patient left: in chair;with call bell/phone within reach;with chair alarm set (pt refused LE's elevated in recliner, feet were not in contact with ground while fully sitting in chair) Nurse Communication: Mobility status PT Visit Diagnosis: Muscle weakness (generalized) (M62.81);Other abnormalities of gait and mobility (R26.89)     Time: 1135-1209 PT Time Calculation (min) (ACUTE ONLY): 34 min  Charges:                       G CodesWetzel Bjornstad, SPT 06/17/2017, 1:32 PM

## 2017-06-17 NOTE — Discharge Instructions (Signed)
Follow-up with pr physician in 4-5 days Follow-up with oncology Dr. Seward Meth in 2 weeks Continue 2 L of oxygen via nasal cannula

## 2017-06-17 NOTE — Discharge Summary (Signed)
[facility] NAME: Sophia Nelson    MR#:  [id number]  DATE OF BIRTH:  [birth year]  DATE OF ADMISSION:  06/08/2017 ADMITTING PHYSICIAN: Harvie Bridge, DO  DATE OF DISCHARGE: 06/17/17  PRIMARY CARE PHYSICIAN: Donnie Coffin, MD    ADMISSION DIAGNOSIS:  Hypocalcemia [E83.51] Hypokalemia [E87.6] Hypoglycemia [E16.2] Hypothermia, initial encounter [T68.XXXA] Urinary tract infection without hematuria, site unspecified [N39.0] Acute renal failure, unspecified acute renal failure type ([place]) [N17.9] Syncope, unspecified syncope type [R55]  DISCHARGE DIAGNOSIS:  Active Problems:   Severe sepsis with septic shock ([place])   Syncope   Metastatic breast cancer ([place])   Hypotension   Palliative care by specialist   Goals of care, counseling/discussion   SECONDARY DIAGNOSIS:   Past Medical History:  Diagnosis Date  . Abdominal distention   . AKI (acute kidney injury) ([place]) 02/24/2017  . Anemia   . Arthritis   . Asthma   . Back pain, chronic 03/15/2017  . Breast cancer ([place]) 2009   RT MASTECTOMY, DCIS  . 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 Cancer of left breast ([place]) 08/09/2015   T4 N1 M1 tumor, INVASIVE LOBULAR CARCINOMA.  . Carcinoma of overlapping sites of left breast in female, estrogen receptor positive ([place]) 03/22/2016  . COPD (chronic obstructive pulmonary disease) ([place])   . Diabetes ([place]) 02/24/2017  . Diabetes mellitus without complication ([place])   . Duodenal obstruction   . Encounter for nasogastric (NG) tube placement   . Gastric outlet obstruction 02/24/2017  . GERD (gastroesophageal reflux disease)   . Headache    h/o migraines as a child  . Hypertension   . Neck pain 04/19/2016  . Pancreatitis, acute 03/04/2017  . Pneumonia 2015  . Recurrent low back pain 03/15/2017  . Shortness of breath dyspnea    with exertion     HOSPITAL COURSE:    HPI: Sophia Nelson is a 71 y.o. female with a known history of bilateral breast cancer with bone metastasesstatus post mastectomy, diabetes, GERD, hypertension, home O2 dependent COPD, chronic hypocalcemia presents to the emergency department for evaluation of syncope.  Patient was transported to the emergency department after being found on the floor by her family after an unknown amount of time. Patient is unable to describe the events leading up to the fall. Currently recall that she was down on the ground. She reports that she has been well in the last couple of days, has normal diet and appetiteand has been taking her medications as prescribed.  Per emergency department records patient reported some blurry vision and headache yesterday however on my questioning patient denied these complaints.her only complaint at this time is being cold.  Patient denies fevers/chills, weakness, dizziness, chest pain, shortness of breath, N/V/C/D, abdominal pain, dysuria/frequency, changes in mental status.    Patient is taking monthly chemotherapy injections for her breast cancer.Otherwise there has been no change in status. Patient has been taking medication as prescribed and there has been no recent change in medication or diet.  No recent antibiotics.  There has been no recent illness, hospitalizations, travel or sick contacts.    EMS/ED Course: patient was found by EMS to be hypoglycemic with a blood sugar of 40 for which she received glucose. In the emergency department she received potassium, magnesium and calcium as well as Rocephin and normal saline. Medical admission has been requested for further  management of sepsis secondary to urinary tract infection,electrolyte derangements including hypocalcemia, hypomagnesemia and hypokalemia  # septic shock secondary to UTI. Cx negative  likely had cystitis. Urine culture with insignificant growth antibiotics discontinued Change  Solu-Cortef IV to by mouth prednisone Discontinue lisinopril at this time  #. AKI  due to septic shock/ATN Resolved.d/c iv fluids.likley d/c home am.   #. Diabetes with hypoglycemia - resolved - Accuchecks  - Held glipizide,blood glucose acceptable. Continued sliding scale insulin with coverage. Encouraged po intake and PCP can resume glipizide   #.metastatic breast cancer, chemotherapy on hold, chemo on hold because of sepsis, acute infection.continue fentanyl patch for pain control,Okay to discharge patient from oncology standpoint and outpatient follow-up in 2 weeks is recommended  #. History of asthma with chronic resp failure - Continue Flonase, Singulair, Advair, Spiriva,on 2 litres of o2.at home,  # Anemia of chronic disease 1 unit PRBC transfused 06/11/2017 Improved and stable  #hypomagnesemia repleted with IV magnesium sulfate  DVT, GI prophylaxis with Lovenox,pepcid.  Physical therapy recommends SNF. Acc to pt and nursing , patient is able to walk to bathroom now, patient wants to go home All the records are reviewed and case discussed with Care Management/Social Workerr. Management plans discussed with the patient, family and they are in agreement.  CODE STATUS: FULL CODE  DISCHARGE CONDITIONS:   Stable   CONSULTS OBTAINED:  Treatment Team:  [name], MD   PROCEDURES  DRUG ALLERGIES:   Allergies  Allergen Reactions  . Other     Onions(that grow in the yard-pt can eat onions without problems) and dust mite Sneezing, cough, runny nose    DISCHARGE MEDICATIONS:   Current Discharge Medication List    START taking these medications   Details  acetaminophen (TYLENOL) 325 MG tablet Take 1 tablet (325 mg total) by mouth every 6 (six) hours as needed for mild pain (or Fever >/= 101).    fentaNYL (DURAGESIC - DOSED MCG/HR) 25 MCG/HR patch Place 1 patch (25 mcg total) onto the skin every 3 (three) days. Qty: 5 patch, Refills: 0     guaiFENesin (MUCINEX) 600 MG 12 hr tablet Take 1 tablet (600 mg total) by mouth 2 (two) times daily as needed for cough or to loosen phlegm.    predniSONE (DELTASONE) 20 MG tablet Take 1 tablet (20 mg total) by mouth daily. Qty: 5 tablet, Refills: 0    senna-docusate (SENOKOT-S) 8.6-50 MG tablet Take 1 tablet by mouth at bedtime as needed for mild constipation.      CONTINUE these medications which have NOT CHANGED   Details  ADVAIR DISKUS 500-50 MCG/DOSE AEPB Inhale 1 puff into the lungs 2 (two) times daily.     albuterol (PROVENTIL HFA;VENTOLIN HFA) 108 (90 Base) MCG/ACT inhaler Inhale 2 puffs into the lungs every 6 (six) hours as needed for wheezing or shortness of breath.    aspirin 81 MG tablet Take 81 mg by mouth daily.    atorvastatin (LIPITOR) 10 MG tablet Take 10 mg by mouth daily.    docusate sodium (COLACE) 100 MG capsule Take 100 mg by mouth 2 (two) times daily.    ergocalciferol (VITAMIN D2) 50000 units capsule Take 1 capsule (50,000 Units total) by mouth once a week. Qty: 12 capsule, Refills: 1   Associated Diagnoses: Carcinoma of overlapping sites of left breast in female, estrogen receptor positive ([place]); Hypocalcemia    feeding supplement, ENSURE ENLIVE, (ENSURE ENLIVE) LIQD Take 237 mLs by mouth 3 (three) times daily between  meals. Qty: 237 mL, Refills: 12    fluticasone (FLONASE) 50 MCG/ACT nasal spray Place 2 sprays into both nostrils daily.    lidocaine-prilocaine (EMLA) cream Apply 1 application topically as needed.    loperamide (IMODIUM) 2 MG capsule One pill after each loose stool; maximum upto 8 pills a day. Qty: 40 capsule, Refills: 0   Associated Diagnoses: Carcinoma of overlapping sites of left breast in female, estrogen receptor positive ([facility])    loratadine (CLARITIN) 10 MG tablet Take 10 mg by mouth daily.    metoCLOPramide (REGLAN) 10 MG tablet Take 1 tablet (10 mg total) by mouth 4 (four) times daily. Qty: 120 tablet, Refills: 3     montelukast (SINGULAIR) 10 MG tablet Take 10 mg by mouth at bedtime.    omeprazole (PRILOSEC) 20 MG capsule Take 20 mg by mouth every morning.     oxyCODONE-acetaminophen (PERCOCET/ROXICET) 5-325 MG tablet Take 1 tablet by mouth every 8 (eight) hours as needed for severe pain. Qty: 90 tablet, Refills: 0   Associated Diagnoses: Carcinoma of overlapping sites of left breast in female, estrogen receptor positive ([place]); Hypocalcemia    OXYGEN Inhale 2 L into the lungs continuous.     SPIRIVA HANDIHALER 18 MCG inhalation capsule Place 18 mcg into inhaler and inhale every morning.     tiZANidine (ZANAFLEX) 2 MG tablet Take 1 tablet by mouth 2 (two) times daily.   Associated Diagnoses: Carcinoma of overlapping sites of left breast in female, estrogen receptor positive ([facility])      STOP taking these medications     Abemaciclib 150 MG TABS      GLIPIZIDE XL 2.5 MG 24 hr tablet      lisinopril (PRINIVIL,ZESTRIL) 40 MG tablet          DISCHARGE INSTRUCTIONS:  Follow-up with pr physician in 4-5 days Follow-up with oncology Dr. Seward Meth in 2 weeks Continue 2 L of oxygen via nasal cannula    DIET:  Diabetic diet  DISCHARGE CONDITION:  Stable  ACTIVITY:  Activity as tolerated  OXYGEN:  Home Oxygen: Yes.     Oxygen Delivery: 2 liters/min via Patient connected to nasal cannula oxygen  DISCHARGE LOCATION:  home   If you experience worsening of your admission symptoms, develop shortness of breath, life threatening emergency, suicidal or homicidal thoughts you must seek medical attention immediately by calling 911 or calling your MD immediately  if symptoms less severe.  You Must read complete instructions/literature along with all the possible adverse reactions/side effects for all the Medicines you take and that have been prescribed to you. Take any new Medicines after you have completely understood and accpet all the possible adverse reactions/side effects.   Please  note  You were cared for by a hospitalist during your hospital stay. If you have any questions about your discharge medications or the care you received while you were in the hospital after you are discharged, you can call the unit and asked to speak with the hospitalist on call if the hospitalist that took care of you is not available. Once you are discharged, your primary care physician will handle any further medical issues. Please note that NO REFILLS for any discharge medications will be authorized once you are discharged, as it is imperative that you return to your primary care physician (or establish a relationship with a primary care physician if you do not have one) for your aftercare needs so that they can reassess your need for medications and monitor your lab values.  Today  Chief Complaint  Patient presents with  . Loss of Consciousness    unwitnessed - found on floor  . Hypoglycemia   Patient is doing fine and denies any complaints. Wants to go home. Refusing to go to skilled care facility  ROS:  CONSTITUTIONAL: Denies fevers, chills. Denies any fatigue, weakness.  EYES: Denies blurry vision, double vision, eye pain. EARS, NOSE, THROAT: Denies tinnitus, ear pain, hearing loss. RESPIRATORY: Denies cough, wheeze, shortness of breath.  CARDIOVASCULAR: Denies chest pain, palpitations, edema.  GASTROINTESTINAL: Denies nausea, vomiting, diarrhea, abdominal pain. Denies bright red blood per rectum. GENITOURINARY: Denies dysuria, hematuria. ENDOCRINE: Denies nocturia or thyroid problems. HEMATOLOGIC AND LYMPHATIC: Denies easy bruising or bleeding. SKIN: Denies rash or lesion. MUSCULOSKELETAL: Denies pain in neck, back, shoulder, knees, hips or arthritic symptoms.  NEUROLOGIC: Denies paralysis, paresthesias.  PSYCHIATRIC: Denies anxiety or depressive symptoms.   VITAL SIGNS:  Blood pressure 122/60, pulse 84, temperature 98.3 F (36.8 C), temperature source Oral, resp. rate  20, height 5\' 1"  (1.549 m), weight 63.7 kg (140 lb 8 oz), SpO2 100 %.  I/O:    Intake/Output Summary (Last 24 hours) at 06/17/17 1330 Last data filed at 06/17/17 1033  Gross per 24 hour  Intake              580 ml  Output                0 ml  Net              580 ml    PHYSICAL EXAMINATION:  GENERAL:  71 y.o.-year-old patient lying in the bed with no acute distress.  EYES: Pupils equal, round, reactive to light and accommodation. No scleral icterus. Extraocular muscles intact.  HEENT: Head atraumatic, normocephalic. Oropharynx and nasopharynx clear.  NECK:  Supple, no jugular venous distention. No thyroid enlargement, no tenderness.  LUNGS: Normal breath sounds bilaterally, no wheezing, rales,rhonchi or crepitation. No use of accessory muscles of respiration.  CARDIOVASCULAR: S1, S2 normal. No murmurs, rubs, or gallops.  ABDOMEN: Soft, non-tender, non-distended. Bowel sounds present. No organomegaly or mass.  EXTREMITIES: No pedal edema, cyanosis, or clubbing.  NEUROLOGIC: Cranial nerves II through XII are intact. Muscle strength 5/5 in all extremities. Sensation intact. Gait not checked.  PSYCHIATRIC: The patient is alert and oriented x 3.  SKIN: No obvious rash, lesion, or ulcer.   DATA REVIEW:   CBC  Recent Labs Lab 06/15/17 0350  WBC 4.0  HGB 8.0*  HCT 23.7*  PLT 211    Chemistries   Recent Labs Lab 06/11/17 0455  06/16/17 0502  NA 135  < > 135  K 3.9  < > 3.9  CL 105  < > 103  CO2 26  < > 28  GLUCOSE 136*  < > 125*  BUN <5*  < > 11  CREATININE 0.62  < > 0.69  CALCIUM 6.4*  < > 8.2*  MG  --   < > 1.4*  AST 30  --   --   ALT 24  --   --   ALKPHOS 117  --   --   BILITOT 0.4  --   --   < > = values in this interval not displayed.  Cardiac Enzymes No results for input(s): TROPONINI in the last 168 hours.  Microbiology Results  Results for orders placed or performed during the hospital encounter of 06/08/17  Urine culture     Status: None   Collection  Time: 06/08/17  11:25 PM  Result Value Ref Range Status   Specimen Description URINE, RANDOM  Final   Special Requests NONE  Final   Culture   Final    NO GROWTH Performed at [facility], [address] [place], [place] [address]    Report Status 06/10/2017 FINAL  Final  MRSA PCR Screening     Status: None   Collection Time: 06/09/17 11:50 AM  Result Value Ref Range Status   MRSA by PCR NEGATIVE NEGATIVE Final    Comment:        The GeneXpert MRSA Assay (FDA approved for NASAL specimens only), is one component of a comprehensive MRSA colonization surveillance program. It is not intended to diagnose MRSA infection nor to guide or monitor treatment for MRSA infections.     RADIOLOGY:  No results found.  EKG:   Orders placed or performed during the hospital encounter of 06/08/17  . ED EKG  . ED EKG      Management plans discussed with the patient, family and they are in agreement.  CODE STATUS:     Code Status Orders        Start     Ordered   06/09/17 0332  Full code  Continuous     06/09/17 0331    Code Status History    Date Active Date Inactive Code Status Order ID Comments User Context   03/04/2017  1:23 AM 03/13/2017  8:56 PM Full Code [id number]  Harvie Bridge, DO Inpatient   02/24/2017 11:40 PM 02/26/2017  5:06 PM Full Code [id number]  Lance Coon, MD Inpatient      TOTAL TIME TAKING CARE OF THIS PATIENT: 43 minutes.   Note: This dictation was prepared with Dragon dictation along with smaller phrase technology. Any transcriptional errors that result from this process are unintentional.   @[facility] @  on 06/17/2017 at 1:30 PM  Between 7am to 6pm - Pager - [phone]  After 6pm go to [email or web address] - password [email or web address] [facility]  [facility]  Office  [phone]  CC: Primary care physician; Donnie Coffin, MD

## 2017-06-28 ENCOUNTER — Inpatient Hospital Stay: Payer: Medicare HMO | Attending: Internal Medicine

## 2017-06-28 ENCOUNTER — Inpatient Hospital Stay: Payer: Medicare HMO

## 2017-06-28 ENCOUNTER — Inpatient Hospital Stay (HOSPITAL_BASED_OUTPATIENT_CLINIC_OR_DEPARTMENT_OTHER): Payer: Medicare HMO | Admitting: Internal Medicine

## 2017-06-28 ENCOUNTER — Encounter: Payer: Self-pay | Admitting: Internal Medicine

## 2017-06-28 DIAGNOSIS — M542 Cervicalgia: Secondary | ICD-10-CM | POA: Insufficient documentation

## 2017-06-28 DIAGNOSIS — M25519 Pain in unspecified shoulder: Secondary | ICD-10-CM

## 2017-06-28 DIAGNOSIS — J449 Chronic obstructive pulmonary disease, unspecified: Secondary | ICD-10-CM | POA: Diagnosis not present

## 2017-06-28 DIAGNOSIS — C772 Secondary and unspecified malignant neoplasm of intra-abdominal lymph nodes: Secondary | ICD-10-CM | POA: Diagnosis not present

## 2017-06-28 DIAGNOSIS — Z170 Estrogen receptor positive status [ER+]: Secondary | ICD-10-CM

## 2017-06-28 DIAGNOSIS — G8929 Other chronic pain: Secondary | ICD-10-CM

## 2017-06-28 DIAGNOSIS — G893 Neoplasm related pain (acute) (chronic): Secondary | ICD-10-CM | POA: Diagnosis not present

## 2017-06-28 DIAGNOSIS — D649 Anemia, unspecified: Secondary | ICD-10-CM | POA: Insufficient documentation

## 2017-06-28 DIAGNOSIS — Z79899 Other long term (current) drug therapy: Secondary | ICD-10-CM | POA: Insufficient documentation

## 2017-06-28 DIAGNOSIS — K219 Gastro-esophageal reflux disease without esophagitis: Secondary | ICD-10-CM | POA: Diagnosis not present

## 2017-06-28 DIAGNOSIS — Z79818 Long term (current) use of other agents affecting estrogen receptors and estrogen levels: Secondary | ICD-10-CM | POA: Diagnosis not present

## 2017-06-28 DIAGNOSIS — C50812 Malignant neoplasm of overlapping sites of left female breast: Secondary | ICD-10-CM

## 2017-06-28 DIAGNOSIS — R634 Abnormal weight loss: Secondary | ICD-10-CM | POA: Insufficient documentation

## 2017-06-28 DIAGNOSIS — E875 Hyperkalemia: Secondary | ICD-10-CM | POA: Diagnosis not present

## 2017-06-28 DIAGNOSIS — M1990 Unspecified osteoarthritis, unspecified site: Secondary | ICD-10-CM | POA: Diagnosis not present

## 2017-06-28 DIAGNOSIS — I10 Essential (primary) hypertension: Secondary | ICD-10-CM

## 2017-06-28 DIAGNOSIS — Z86000 Personal history of in-situ neoplasm of breast: Secondary | ICD-10-CM | POA: Diagnosis not present

## 2017-06-28 DIAGNOSIS — C7951 Secondary malignant neoplasm of bone: Secondary | ICD-10-CM | POA: Diagnosis not present

## 2017-06-28 DIAGNOSIS — E119 Type 2 diabetes mellitus without complications: Secondary | ICD-10-CM | POA: Insufficient documentation

## 2017-06-28 DIAGNOSIS — Z9013 Acquired absence of bilateral breasts and nipples: Secondary | ICD-10-CM

## 2017-06-28 DIAGNOSIS — R112 Nausea with vomiting, unspecified: Secondary | ICD-10-CM | POA: Diagnosis not present

## 2017-06-28 DIAGNOSIS — E876 Hypokalemia: Secondary | ICD-10-CM | POA: Insufficient documentation

## 2017-06-28 DIAGNOSIS — F17210 Nicotine dependence, cigarettes, uncomplicated: Secondary | ICD-10-CM

## 2017-06-28 LAB — COMPREHENSIVE METABOLIC PANEL
ALBUMIN: 2.9 g/dL — AB (ref 3.5–5.0)
ALK PHOS: 164 U/L — AB (ref 38–126)
ALT: 18 U/L (ref 14–54)
ANION GAP: 9 (ref 5–15)
AST: 29 U/L (ref 15–41)
BILIRUBIN TOTAL: 0.5 mg/dL (ref 0.3–1.2)
BUN: 8 mg/dL (ref 6–20)
CALCIUM: 6.8 mg/dL — AB (ref 8.9–10.3)
CO2: 31 mmol/L (ref 22–32)
Chloride: 101 mmol/L (ref 101–111)
Creatinine, Ser: 0.49 mg/dL (ref 0.44–1.00)
GFR calc Af Amer: >60 mL/min (ref >60)
GLUCOSE: 101 mg/dL — AB (ref 65–99)
Potassium: 3.3 mmol/L — ABNORMAL LOW (ref 3.5–5.1)
Sodium: 141 mmol/L (ref 135–145)
TOTAL PROTEIN: 6.3 g/dL — AB (ref 6.5–8.1)

## 2017-06-28 LAB — CBC WITH DIFFERENTIAL/PLATELET
BASOS PCT: 2 %
Basophils Absolute: 0.2 10*3/uL — ABNORMAL HIGH (ref 0–0.1)
Eosinophils Absolute: 0.1 10*3/uL (ref 0–0.7)
Eosinophils Relative: 1 %
HEMATOCRIT: 29.7 % — AB (ref 35.0–47.0)
HEMOGLOBIN: 9.8 g/dL — AB (ref 12.0–16.0)
LYMPHS PCT: 20 %
Lymphs Abs: 1.6 10*3/uL (ref 1.0–3.6)
MCH: 28.8 pg (ref 26.0–34.0)
MCHC: 32.8 g/dL (ref 32.0–36.0)
MCV: 87.8 fL (ref 80.0–100.0)
MONO ABS: 0.8 10*3/uL (ref 0.2–0.9)
MONOS PCT: 10 %
NEUTROS ABS: 5.4 10*3/uL (ref 1.4–6.5)
Neutrophils Relative %: 67 %
Platelets: 399 10*3/uL (ref 150–440)
RBC: 3.39 MIL/uL — ABNORMAL LOW (ref 3.80–5.20)
RDW: 22 % — ABNORMAL HIGH (ref 11.5–14.5)
WBC: 8.1 10*3/uL (ref 3.6–11.0)

## 2017-06-28 LAB — MAGNESIUM: MAGNESIUM: 1.2 mg/dL — AB (ref 1.7–2.4)

## 2017-06-28 LAB — VITAMIN B12: Vitamin B-12: 306 pg/mL (ref 180–914)

## 2017-06-28 MED ORDER — MAGNESIUM CL-CALCIUM CARBONATE 70-117 MG PO TBEC: 1.0000 | DELAYED_RELEASE_TABLET | Freq: Two times a day (BID) | ORAL | 6 refills | Status: AC

## 2017-06-28 MED ORDER — FULVESTRANT 250 MG/5ML IM SOLN
500.0000 mg | Freq: Once | INTRAMUSCULAR | Status: AC
  Administered 2017-06-28: 500 mg via INTRAMUSCULAR
  Filled 2017-06-28: qty 10

## 2017-06-28 MED ORDER — OXYCODONE-ACETAMINOPHEN 5-325 MG PO TABS: 1.0000 | ORAL_TABLET | Freq: Three times a day (TID) | ORAL | 0 refills | Status: DC | PRN

## 2017-06-28 NOTE — Progress Notes (Signed)
[place] OFFICE PROGRESS NOTE  Patient Care Team: Donnie Coffin, MD as PCP - General (Family Medicine) Donnie Coffin, MD as Referring Physician (Family Medicine) Christene Lye, MD (General Surgery) Forest Gleason, MD (Oncology)  Cancer of left breast [facility])   Staging form: Breast, AJCC 7th Edition     Clinical: Stage IIIB (T4, N1, cM0(i+)) - Signed by Forest Gleason, MD on 08/09/2015     Pathologic: Stage IV (T4, N1, M1) - Signed by Forest Gleason, MD on 08/16/2015    Oncology History   # DEC 2016-  LEFT BREAST CA- LOBULAR CA ER/PR-Pos; her 2 NEG STAGE IV [[id number]- left breast/bil Ax LN/Media LN/RP LN; skeletal mets]; DEC 2016- LETROZOLE + IBRANCE; April 2017- Significant response to treatment- [Dr.Sankar]; s/p Left mastec & ALND [palliative]; cont Ibrance + Letrzole. Stopped Ibrance [May 2018/sec to Anemia]  # July 2018- PROGRESSION [PET scan-L1 lytic lesion; Small bowel extrinsic compression s/p lap resection + Lobular cancer; Dr.Davis ]  # AUG 9th th-FASLODEX; SEP 1 week- Verzinio 150 BID  # AUG 2018- s/p RT to L3 lytic lesion  # RIGHT BREAST CA [s/p mastectomy [facility]]     Carcinoma of overlapping sites of left breast in female, estrogen receptor positive ([place])    INTERVAL HISTORY:  Sophia Nelson 71 y.o.  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  In the interim patient was admitted to the hospital for sepsis/hypertension- found to have multiple electrolyte abnormalities including hypomagnesemia hypokalemia hypophosphatemia. Patient was initially on multiple antibiotics/vasopressors in the ICU. And finally improved discharged home.  Patient denies any tingling or numbness. Denies any cramps. Denies any headaches. Denies any back pain. Continues to complain of chronic  shoulder pain with pain. Patient denies any worsening pain. She continues to be on narcotic pain medication-Percocet.  Denies any worsening shortness of breath with exertion and cough. Denies any fevers or chills. No diarrhea. Appetite improving.  REVIEW OF SYSTEMS:  A complete 10 point review of system is done which is negative except mentioned above/history of present illness.   PAST MEDICAL HISTORY :  Past Medical History:  Diagnosis Date  . Abdominal distention   . AKI (acute kidney injury) ([place]) 02/24/2017  . Anemia   . Arthritis   . Asthma   . Back pain, chronic 03/15/2017  . Breast cancer ([place]) 2009   RT MASTECTOMY, DCIS  . 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 Cancer of left breast ([place]) 08/09/2015   T4 N1 M1 tumor, INVASIVE LOBULAR CARCINOMA.  . Carcinoma of overlapping sites of left breast in female, estrogen receptor positive ([place]) 03/22/2016  . COPD (chronic obstructive pulmonary disease) ([place])   . Diabetes ([place]) 02/24/2017  . Diabetes mellitus without complication ([place])   . Duodenal obstruction   . Encounter for nasogastric (NG) tube placement   . Gastric outlet obstruction 02/24/2017  . GERD (gastroesophageal reflux disease)   . Headache    h/o migraines as a child  . Hypertension   . Neck pain 04/19/2016  . Pancreatitis, acute 03/04/2017  . Pneumonia 2015  . Recurrent low back pain 03/15/2017  . Shortness of breath dyspnea    with exertion    PAST SURGICAL HISTORY :   Past Surgical History:  Procedure  Laterality Date  . ABDOMINAL HYSTERECTOMY    . BREAST SURGERY Right 2015   mastectomy  . ESOPHAGOGASTRODUODENOSCOPY N/A 02/25/2017   Procedure: ESOPHAGOGASTRODUODENOSCOPY (EGD);  Surgeon: Jonathon Bellows, MD;  Location: [facility] ENDOSCOPY;  Service: Endoscopy;  Laterality: N/A;  . ESOPHAGOGASTRODUODENOSCOPY (EGD) WITH PROPOFOL N/A 03/06/2017   Procedure: ESOPHAGOGASTRODUODENOSCOPY  (EGD) WITH PROPOFOL;  Surgeon: [name], MD;  Location: [facility] ENDOSCOPY;  Service: Endoscopy;  Laterality: N/A;  . GASTROJEJUNOSTOMY N/A 03/08/2017   Procedure: Roux-en-Y gastrojejunostomy Bypass of Gastric Outlet Obstruction, small bowel resection;  Surgeon: Vickie Epley, MD;  Location: [facility] ORS;  Service: General;  Laterality: N/A;  . SIMPLE MASTECTOMY WITH AXILLARY SENTINEL NODE BIOPSY Left 02/29/2016   Procedure: SIMPLE MASTECTOMY;  Surgeon: Christene Lye, MD;  Location: [facility] ORS;  Service: General;  Laterality: Left;  . TUBAL LIGATION      FAMILY HISTORY :   Family History  Problem Relation Age of Onset  . Lung cancer Father   . Cancer Maternal Aunt   . Breast cancer Neg Hx     SOCIAL HISTORY:   Social History  Substance Use Topics  . Smoking status: Current Some Day Smoker    Packs/day: 0.25    Years: 15.00    Types: Cigarettes    Last attempt to quit: 02/25/2016  . Smokeless tobacco: Never Used     Comment: currently as of 02-21-16 pt states she is only smoking 2-3 cigarettes per day  . 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  MEDICATIONS:  Current Outpatient Prescriptions  Medication Sig Dispense Refill  . acetaminophen (TYLENOL) 325 MG tablet Take 1 tablet (325 mg total) by mouth every 6 (six) hours as needed for mild pain (or Fever >/= 101).    . ADVAIR DISKUS 500-50 MCG/DOSE AEPB Inhale 1 puff into the lungs 2 (two) times daily.     [name] albuterol (PROVENTIL HFA;VENTOLIN HFA) 108 (90 Base) MCG/ACT inhaler Inhale 2 puffs into the lungs every 6 (six) hours as needed for wheezing or shortness of breath.    [name] aspirin 81 MG tablet Take 81 mg by mouth daily.    [name] atorvastatin (LIPITOR) 10 MG tablet Take 10 mg by mouth daily.    [name] docusate sodium (COLACE) 100 MG capsule Take 100 mg by mouth 2 (two) times daily.    . ergocalciferol  (VITAMIN D2) 50000 units capsule Take 1 capsule (50,000 Units total) by mouth once a week. 12 capsule 1  . feeding supplement, ENSURE ENLIVE, (ENSURE ENLIVE) LIQD Take 237 mLs by mouth 3 (three) times daily between meals. 237 mL 12  . fluticasone (FLONASE) 50 MCG/ACT nasal spray Place 2 sprays into both nostrils daily.    [name] guaiFENesin (MUCINEX) 600 MG 12 hr tablet Take 1 tablet (600 mg total) by mouth 2 (two) times daily as needed for cough or to loosen phlegm.    . lidocaine-prilocaine (EMLA) cream Apply 1 application topically as needed.    . loperamide (IMODIUM) 2 MG capsule One pill after each loose stool; maximum upto 8 pills a day. 40 capsule 0  . loratadine (CLARITIN) 10 MG tablet Take 10 mg by mouth daily.    . metoCLOPramide (REGLAN) 10 MG tablet Take 1 tablet (10 mg total) by mouth 4 (four) times daily. 120 tablet 3  . montelukast (SINGULAIR) 10 MG tablet  Take 10 mg by mouth at bedtime.    [name] omeprazole (PRILOSEC) 20 MG capsule Take 20 mg by mouth every morning.     [name] oxyCODONE-acetaminophen (PERCOCET/ROXICET) 5-325 MG tablet Take 1 tablet by mouth every 8 (eight) hours as needed for severe pain. 90 tablet 0  . OXYGEN Inhale 2 L into the lungs continuous.     [name] senna-docusate (SENOKOT-S) 8.6-50 MG tablet Take 1 tablet by mouth at bedtime as needed for mild constipation.    [name] SPIRIVA HANDIHALER 18 MCG inhalation capsule Place 18 mcg into inhaler and inhale every morning.     [name] tiZANidine (ZANAFLEX) 2 MG tablet Take 1 tablet by mouth 2 (two) times daily.    . fentaNYL (DURAGESIC - DOSED MCG/HR) 25 MCG/HR patch Place 1 patch (25 mcg total) onto the skin every 3 (three) days. (Patient not taking: Reported on 06/28/2017) 5 patch 0  . Magnesium Cl-Calcium Carbonate (SLOW MAGNESIUM/CALCIUM) 70-117 MG TBEC Take 1 tablet by mouth 2 (two) times daily. 60 tablet 6   No current facility-administered medications for this visit.     PHYSICAL EXAMINATION: ECOG PERFORMANCE STATUS: 0 -  Asymptomatic  BP 117/73 (BP Location: Right Arm, Patient Position: Sitting)   Pulse 84   Temp (!) 97 F (36.1 C) (Tympanic)   Wt 129 lb (58.5 kg)   SpO2 98%   PF (!) 2 L/min   BMI 24.37 kg/m   Filed Weights   06/28/17 1044  Weight: 129 lb (58.5 kg)    GENERAL: Well-nourished well-developed; Alert, no distress and comfortable. She is alone. She is walking herself. EYES: no pallor or icterus OROPHARYNX: no thrush or ulceration; poor dentition.   NECK: supple, no masses felt LYMPH:  no palpable lymphadenopathy in the cervical, axillary or inguinal regions LUNGS: clear to auscultation and  No wheeze or crackles HEART/CVS: regular rate & rhythm and no murmurs; No lower extremity edema ABDOMEN:abdomen soft, non-tender and normal bowel sounds Musculoskeletal:no cyanosis of digits and no clubbing;  PSYCH: alert & oriented x 3 with fluent speech NEURO: no focal motor/sensory deficits SKIN:  no rashes or significant lesions   LABORATORY DATA:  I have reviewed the data as listed    Component Value Date/Time   NA 141 06/28/2017 1036   NA 133 (L) 04/08/2014 1738   K 3.3 (L) 06/28/2017 1036   K 4.2 04/08/2014 1738   CL 101 06/28/2017 1036   CL 99 04/08/2014 1738   CO2 31 06/28/2017 1036   CO2 28 04/08/2014 1738   GLUCOSE 101 (H) 06/28/2017 1036   GLUCOSE 116 (H) 04/08/2014 1738   BUN 8 06/28/2017 1036   BUN 5 (L) 04/08/2014 1738   CREATININE 0.49 06/28/2017 1036   CREATININE 0.42 (L) 04/08/2014 1738   CALCIUM 6.8 (L) 06/28/2017 1036   CALCIUM 4.9 06/09/2017 1441   PROT 6.3 (L) 06/28/2017 1036   ALBUMIN 2.9 (L) 06/28/2017 1036   AST 29 06/28/2017 1036   ALT 18 06/28/2017 1036   ALKPHOS 164 (H) 06/28/2017 1036   BILITOT 0.5 06/28/2017 1036   GFRNONAA >60 06/28/2017 1036   GFRNONAA >60 04/08/2014 1738   GFRAA >60 06/28/2017 1036   GFRAA >60 04/08/2014 1738    No results found for: SPEP, UPEP  Lab Results  Component Value Date   WBC 8.1 06/28/2017   NEUTROABS 5.4  06/28/2017   HGB 9.8 (L) 06/28/2017   HCT 29.7 (L) 06/28/2017   MCV 87.8 06/28/2017   PLT 399 06/28/2017  Chemistry      Component Value Date/Time   NA 141 06/28/2017 1036   NA 133 (L) 04/08/2014 1738   K 3.3 (L) 06/28/2017 1036   K 4.2 04/08/2014 1738   CL 101 06/28/2017 1036   CL 99 04/08/2014 1738   CO2 31 06/28/2017 1036   CO2 28 04/08/2014 1738   BUN 8 06/28/2017 1036   BUN 5 (L) 04/08/2014 1738   CREATININE 0.49 06/28/2017 1036   CREATININE 0.42 (L) 04/08/2014 1738      Component Value Date/Time   CALCIUM 6.8 (L) 06/28/2017 1036   CALCIUM 4.9 06/09/2017 1441   ALKPHOS 164 (H) 06/28/2017 1036   AST 29 06/28/2017 1036   ALT 18 06/28/2017 1036   BILITOT 0.5 06/28/2017 1036       RADIOGRAPHIC STUDIES: I have personally reviewed the radiological images as listed and agreed with the findings in the report. No results found.   ASSESSMENT & PLAN:  Carcinoma of overlapping sites of left breast in female, estrogen receptor positive ([place]) # Metastatic breast cancer/lobular ER/PR positive HER-2/neu negative-July-AUG 2018- progression in the bone L3; also abdomen causing extrinsic compression of the small bowel [status post resection-C discussion below].  Patient currently on Faslodex;  recommend restarting Verzinio 150 mg BID again today [C discussion below].    # Recent UTI admission to the hospital sepsis- resolved. Plan to restart Verzinio.   # Lytic lesion at L3- positive on PET scan. Lumbar spine shows L3 lesion- on RT [last treatment on 8/27]; no clinical evidence of progression. will plan to get an MRI next few months.  # Bone metastases-s/p X-geva; HOLD today because of severe hypocalcemia/multiple electrolyte abnormalities.  # Neck pain/bone pain from malignancy- continue oxycodone. New prescription given.  # Hypocalcemia- Ca- 6.8 ; HOLD x-geva; continue vit D.   # Hypomagnesemia- reluctant with infusion; recommend PO mag-ca BID.   # #  Anemia-multifactorial hemoglobin - improving Hb 9. 3/improving. Monitor for now.  # Follow-up in 4 weeks with labs/Faslodex.   Orders Placed This Encounter  Procedures  . Magnesium    Standing Status:   Future    Standing Expiration Date:   07/26/2017       [name], MD 06/28/2017 1:25 PM

## 2017-06-28 NOTE — Assessment & Plan Note (Addendum)
#  Metastatic breast cancer/lobular ER/PR positive HER-2/neu negative-July-AUG 2018- progression in the bone L3; also abdomen causing extrinsic compression of the small bowel [status post resection-C discussion below].  Patient currently on Faslodex;  recommend restarting Verzinio 150 mg BID again today [C discussion below].    # Recent UTI admission to the hospital sepsis- resolved. Plan to restart Verzinio.   # Lytic lesion at L3- positive on PET scan. Lumbar spine shows L3 lesion- on RT [last treatment on 8/27]; no clinical evidence of progression. will plan to get an MRI next few months.  # Bone metastases-s/p X-geva; HOLD today because of severe hypocalcemia/multiple electrolyte abnormalities.  # Neck pain/bone pain from malignancy- continue oxycodone. New prescription given.  # Hypocalcemia- Ca- 6.8 ; HOLD x-geva; continue vit D.   # Hypomagnesemia- reluctant with infusion; recommend PO mag-ca BID.   # # Anemia-multifactorial hemoglobin - improving Hb 9. 3/improving. Monitor for now.  # Follow-up in 4 weeks with labs/Faslodex.

## 2017-06-28 NOTE — Progress Notes (Signed)
Patient here for follow up with labs and possible infusion. She states that she is feeling much better today. She is requesting a refill of her oxycodone today so she won't have to make another trip over here next week to get the RX.

## 2017-07-04 MED FILL — VERZENIO 150 MG TAB: 150 | 28 days supply | Qty: 56 | Fill #2

## 2017-07-24 ENCOUNTER — Telehealth: Payer: Self-pay | Admitting: Pharmacist

## 2017-07-24 NOTE — Telephone Encounter (Signed)
Oral Chemotherapy Pharmacist Encounter  Follow-Up Form  Called patient today to follow up regarding patient's oral chemotherapy medication: Verzenio (abemaciclib)   Original Start date of oral chemotherapy: Sept 2018   Pt reports 1 week of doses of Verzenio missed in the last moth.  Missed dose(s) attributed to: Not feeling well/experiencing pain  Reviewed plan for missed doses.   Pt reports the following side effects: N/A  Recent labs reviewed: CBC with Diff and CBC on 11/2 reviewed; appropriate to continue current therapy.   Other Issues: N/A   Patient knows to call the office with questions or concerns. Oral Oncology Clinic will continue to follow.  Sophia Nelson, PharmD  Pharmacy Resident  [facility] [phone]  07/24/2017 2:07 PM

## 2017-07-26 ENCOUNTER — Inpatient Hospital Stay: Payer: Medicare HMO

## 2017-07-26 ENCOUNTER — Inpatient Hospital Stay (HOSPITAL_BASED_OUTPATIENT_CLINIC_OR_DEPARTMENT_OTHER): Payer: Medicare HMO | Admitting: Internal Medicine

## 2017-07-26 VITALS — BP 122/81 | HR 103 | Temp 97.8°F | Resp 20 | Ht 61.0 in | Wt 114.0 lb

## 2017-07-26 DIAGNOSIS — Z79818 Long term (current) use of other agents affecting estrogen receptors and estrogen levels: Secondary | ICD-10-CM

## 2017-07-26 DIAGNOSIS — R112 Nausea with vomiting, unspecified: Secondary | ICD-10-CM

## 2017-07-26 DIAGNOSIS — D649 Anemia, unspecified: Secondary | ICD-10-CM | POA: Diagnosis not present

## 2017-07-26 DIAGNOSIS — M25519 Pain in unspecified shoulder: Secondary | ICD-10-CM

## 2017-07-26 DIAGNOSIS — Z9013 Acquired absence of bilateral breasts and nipples: Secondary | ICD-10-CM | POA: Diagnosis not present

## 2017-07-26 DIAGNOSIS — E875 Hyperkalemia: Secondary | ICD-10-CM | POA: Diagnosis not present

## 2017-07-26 DIAGNOSIS — I10 Essential (primary) hypertension: Secondary | ICD-10-CM

## 2017-07-26 DIAGNOSIS — Z86000 Personal history of in-situ neoplasm of breast: Secondary | ICD-10-CM

## 2017-07-26 DIAGNOSIS — C50812 Malignant neoplasm of overlapping sites of left female breast: Secondary | ICD-10-CM

## 2017-07-26 DIAGNOSIS — C7951 Secondary malignant neoplasm of bone: Secondary | ICD-10-CM | POA: Diagnosis not present

## 2017-07-26 DIAGNOSIS — M542 Cervicalgia: Secondary | ICD-10-CM

## 2017-07-26 DIAGNOSIS — F17210 Nicotine dependence, cigarettes, uncomplicated: Secondary | ICD-10-CM

## 2017-07-26 DIAGNOSIS — C772 Secondary and unspecified malignant neoplasm of intra-abdominal lymph nodes: Secondary | ICD-10-CM

## 2017-07-26 DIAGNOSIS — Z170 Estrogen receptor positive status [ER+]: Secondary | ICD-10-CM | POA: Diagnosis not present

## 2017-07-26 DIAGNOSIS — E119 Type 2 diabetes mellitus without complications: Secondary | ICD-10-CM

## 2017-07-26 DIAGNOSIS — K219 Gastro-esophageal reflux disease without esophagitis: Secondary | ICD-10-CM

## 2017-07-26 DIAGNOSIS — J449 Chronic obstructive pulmonary disease, unspecified: Secondary | ICD-10-CM

## 2017-07-26 DIAGNOSIS — R634 Abnormal weight loss: Secondary | ICD-10-CM

## 2017-07-26 DIAGNOSIS — Z79899 Other long term (current) drug therapy: Secondary | ICD-10-CM

## 2017-07-26 DIAGNOSIS — G8929 Other chronic pain: Secondary | ICD-10-CM

## 2017-07-26 DIAGNOSIS — M1990 Unspecified osteoarthritis, unspecified site: Secondary | ICD-10-CM

## 2017-07-26 DIAGNOSIS — Z452 Encounter for adjustment and management of vascular access device: Secondary | ICD-10-CM

## 2017-07-26 DIAGNOSIS — G893 Neoplasm related pain (acute) (chronic): Secondary | ICD-10-CM

## 2017-07-26 LAB — COMPREHENSIVE METABOLIC PANEL
ALK PHOS: 81 U/L (ref 38–126)
ALT: 8 U/L — ABNORMAL LOW (ref 14–54)
ANION GAP: 11 (ref 5–15)
AST: 30 U/L (ref 15–41)
Albumin: 2.6 g/dL — ABNORMAL LOW (ref 3.5–5.0)
BUN: 15 mg/dL (ref 6–20)
CALCIUM: 8 mg/dL — AB (ref 8.9–10.3)
CO2: 23 mmol/L (ref 22–32)
CREATININE: 0.8 mg/dL (ref 0.44–1.00)
Chloride: 102 mmol/L (ref 101–111)
Glucose, Bld: 97 mg/dL (ref 65–99)
Potassium: 5.6 mmol/L — ABNORMAL HIGH (ref 3.5–5.1)
SODIUM: 136 mmol/L (ref 135–145)
Total Bilirubin: 1 mg/dL (ref 0.3–1.2)
Total Protein: 6.1 g/dL — ABNORMAL LOW (ref 6.5–8.1)

## 2017-07-26 LAB — CBC WITH DIFFERENTIAL/PLATELET
Basophils Absolute: 0.1 10*3/uL (ref 0–0.1)
Basophils Relative: 2 %
EOS ABS: 0 10*3/uL (ref 0–0.7)
EOS PCT: 1 %
HCT: 31.9 % — ABNORMAL LOW (ref 35.0–47.0)
HEMOGLOBIN: 10.2 g/dL — AB (ref 12.0–16.0)
LYMPHS ABS: 1.7 10*3/uL (ref 1.0–3.6)
LYMPHS PCT: 30 %
MCH: 28.5 pg (ref 26.0–34.0)
MCHC: 31.9 g/dL — AB (ref 32.0–36.0)
MCV: 89.2 fL (ref 80.0–100.0)
MONOS PCT: 7 %
Monocytes Absolute: 0.4 10*3/uL (ref 0.2–0.9)
Neutro Abs: 3.5 10*3/uL (ref 1.4–6.5)
Neutrophils Relative %: 60 %
PLATELETS: 405 10*3/uL (ref 150–440)
RBC: 3.58 MIL/uL — ABNORMAL LOW (ref 3.80–5.20)
RDW: 20.2 % — ABNORMAL HIGH (ref 11.5–14.5)
WBC: 5.8 10*3/uL (ref 3.6–11.0)

## 2017-07-26 LAB — MAGNESIUM: Magnesium: 2.2 mg/dL (ref 1.7–2.4)

## 2017-07-26 MED ORDER — PROCHLORPERAZINE MALEATE 10 MG PO TABS: 10.0000 mg | ORAL_TABLET | Freq: Four times a day (QID) | ORAL | 3 refills | Status: AC | PRN

## 2017-07-26 MED ORDER — OXYCODONE-ACETAMINOPHEN 5-325 MG PO TABS: 1.0000 | ORAL_TABLET | Freq: Three times a day (TID) | ORAL | 0 refills | Status: DC | PRN

## 2017-07-26 MED ORDER — ONDANSETRON 8 MG PO TBDP: 8.0000 mg | ORAL_TABLET | Freq: Three times a day (TID) | ORAL | 3 refills | Status: DC | PRN

## 2017-07-26 NOTE — Progress Notes (Signed)
Patient reports burning sensation on soles of bilaterally feet which started approximatley 2 weeks ago. She reports numbness on her heels bilaterally. Patient reports LLQ pain -rates pain 3/10-she is "requesting pain medications to take. But when I put my pain score together with my feet and my abdominal pain-I feel like my pain is at a 6." pt requesting pain medication in the clinic before discharge today if possible. States that she dropped her "percocet bottle on the side walk approximately 2 weeks ago. It was raining and I was not able to retrieve those pills. I've been out of pain meds for almost 2 weeks."  Patient has lost 15 lbs since last visit. She states that she "has no appetite. Frequently gets nauseated with meal intake and vomits at least once day." She states she "doesn't want any nausea medications. I just don't want any more nausea medications. I don't need any other medications on my list. I don't want any medications for my burning for my feet either." pt requesting boost samples-berry flavor if possible. Discussed dietary referral with patient.  Patient admits to missing routine medications. "I can not remember to take my medication." RN Discussed medication dispensing containers with patient to see if this would aide her remembering taking medication;

## 2017-07-26 NOTE — Progress Notes (Signed)
[place] OFFICE PROGRESS NOTE  Patient Care Team: Donnie Coffin, MD as PCP - General (Family Medicine) Donnie Coffin, MD as Referring Physician (Family Medicine) Christene Lye, MD (General Surgery) Forest Gleason, MD (Oncology)  Cancer of left breast [facility])   Staging form: Breast, AJCC 7th Edition     Clinical: Stage IIIB (T4, N1, cM0(i+)) - Signed by Forest Gleason, MD on 08/09/2015     Pathologic: Stage IV (T4, N1, M1) - Signed by Forest Gleason, MD on 08/16/2015    Oncology History   # DEC 2016-  LEFT BREAST CA- LOBULAR CA ER/PR-Pos; her 2 NEG STAGE IV [[id number]- left breast/bil Ax LN/Media LN/RP LN; skeletal mets]; DEC 2016- LETROZOLE + IBRANCE; April 2017- Significant response to treatment- [Dr.Sankar]; s/p Left mastec & ALND [palliative]; cont Ibrance + Letrzole. Stopped Ibrance [May 2018/sec to Anemia]  # July 2018- PROGRESSION [PET scan-L1 lytic lesion; Small bowel extrinsic compression s/p lap resection + Lobular cancer; Dr.Davis ]  # AUG 9th th-FASLODEX; SEP 1 week- Verzinio 150 BID  # AUG 2018- s/p RT to L3 lytic lesion  # RIGHT BREAST CA [s/p mastectomy [facility]]     Carcinoma of overlapping sites of left breast in female, estrogen receptor positive ([place])    INTERVAL HISTORY:  BREI POCIASK 71 y.o.  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  In the interim patient has not had any hospitalizations.  However she feels overall poorly.  Significant weight loss.  Poor appetite.  Intermittent nausea with vomiting.  Intermittent abdominal pain.  Patient continues to be on oxycodone-Tylenol for her shoulder pain back pain.   Denies any worsening shortness of breath with exertion and cough. Denies any fevers or chills.  Denies any constipation or diarrhea.  REVIEW OF  SYSTEMS:  A complete 10 point review of system is done which is negative except mentioned above/history of present illness.   PAST MEDICAL HISTORY :  Past Medical History:  Diagnosis Date  . Abdominal distention   . AKI (acute kidney injury) ([place]) 02/24/2017  . Anemia   . Arthritis   . Asthma   . Back pain, chronic 03/15/2017  . Breast cancer ([place]) 2009   RT MASTECTOMY, DCIS  . 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 Cancer of left breast ([place]) 08/09/2015   T4 N1 M1 tumor, INVASIVE LOBULAR CARCINOMA.  . Carcinoma of overlapping sites of left breast in female, estrogen receptor positive ([place]) 03/22/2016  . COPD (chronic obstructive pulmonary disease) ([place])   . Diabetes ([place]) 02/24/2017  . Diabetes mellitus without complication ([place])   . Duodenal obstruction   . Encounter for nasogastric (NG) tube placement   . Gastric outlet obstruction 02/24/2017  . GERD (gastroesophageal reflux disease)   . Headache    h/o migraines as a child  . Hypertension   . Neck pain 04/19/2016  . Pancreatitis, acute 03/04/2017  . Pneumonia 2015  . Recurrent low back pain 03/15/2017  . Shortness of breath dyspnea    with exertion    PAST SURGICAL HISTORY :   Past Surgical History:  Procedure Laterality Date  . ABDOMINAL HYSTERECTOMY    . BREAST SURGERY Right 2015   mastectomy  . ESOPHAGOGASTRODUODENOSCOPY N/A 02/25/2017   Procedure: ESOPHAGOGASTRODUODENOSCOPY (EGD);  Surgeon: Jonathon Bellows, MD;  Location[facility] ENDOSCOPY;  Service: Endoscopy;  Laterality: N/A;  . ESOPHAGOGASTRODUODENOSCOPY (EGD) WITH PROPOFOL N/A 03/06/2017   Procedure: ESOPHAGOGASTRODUODENOSCOPY (EGD) WITH PROPOFOL;  Surgeon: [name], MD;  Location: [facility] ENDOSCOPY;  Service: Endoscopy;  Laterality: N/A;  . GASTROJEJUNOSTOMY N/A 03/08/2017   Procedure: Roux-en-Y gastrojejunostomy Bypass of Gastric Outlet Obstruction, small bowel resection;  Surgeon:  Vickie Epley, MD;  Location: [facility] ORS;  Service: General;  Laterality: N/A;  . SIMPLE MASTECTOMY WITH AXILLARY SENTINEL NODE BIOPSY Left 02/29/2016   Procedure: SIMPLE MASTECTOMY;  Surgeon: Christene Lye, MD;  Location: [facility] ORS;  Service: General;  Laterality: Left;  . TUBAL LIGATION      FAMILY HISTORY :   Family History  Problem Relation Age of Onset  . Lung cancer Father   . Cancer Maternal Aunt   . Breast cancer Neg Hx     SOCIAL HISTORY:   Social History   Tobacco Use  . Smoking status: Current Some Day Smoker    Packs/day: 0.25    Years: 15.00    Pack years: 3.75    Types: Cigarettes    Last attempt to quit: 02/25/2016    Years since quitting: 1.4  . Smokeless tobacco: Never Used  . Tobacco comment: currently as of 02-21-16 pt states she is only smoking 2-3 cigarettes per day  Substance Use Topics  . 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 Drug use: No    ALLERGIES:  is allergic to other.  MEDICATIONS:  Current Outpatient Medications  Medication Sig Dispense Refill  . ADVAIR DISKUS 500-50 MCG/DOSE AEPB Inhale 1 puff into the lungs 2 (two) times daily.     [name] albuterol (PROVENTIL HFA;VENTOLIN HFA) 108 (90 Base) MCG/ACT inhaler Inhale 2 puffs into the lungs every 6 (six) hours as needed for wheezing or shortness of breath.    [name] aspirin 81 MG tablet Take 81 mg by mouth daily.    [name] atorvastatin (LIPITOR) 10 MG tablet Take 10 mg by mouth daily.    [name] docusate sodium (COLACE) 100 MG capsule Take 100 mg by mouth 2 (two) times daily.    . ergocalciferol (VITAMIN D2) 50000 units capsule Take 1 capsule (50,000 Units total) by mouth once a week. 12 capsule 1  . feeding supplement, ENSURE ENLIVE, (ENSURE ENLIVE) LIQD Take 237 mLs by mouth 3 (three) times daily between meals. 237 mL 12  . fluticasone (FLONASE) 50 MCG/ACT nasal spray Place 2 sprays into  both nostrils daily.    [name] guaiFENesin (MUCINEX) 600 MG 12 hr tablet Take 1 tablet (600 mg total) by mouth 2 (two) times daily as needed for cough or to loosen phlegm.    . lidocaine-prilocaine (EMLA) cream Apply 1 application topically as needed.    . loratadine (CLARITIN) 10 MG tablet Take 10 mg by mouth daily.    . Magnesium Cl-Calcium Carbonate (SLOW MAGNESIUM/CALCIUM) 70-117 MG TBEC Take 1 tablet by mouth 2 (two) times daily. 60 tablet 6  . metoCLOPramide (REGLAN) 10 MG tablet Take 1 tablet (10 mg total) by mouth 4 (four) times daily. 120 tablet 3  . montelukast (SINGULAIR) 10 MG tablet Take 10 mg by mouth at bedtime.    [name] omeprazole (PRILOSEC) 20 MG capsule Take 20 mg by mouth every morning.     . senna-docusate (SENOKOT-S) 8.6-50 MG  tablet Take 1 tablet by mouth at bedtime as needed for mild constipation.    [name] SPIRIVA HANDIHALER 18 MCG inhalation capsule Place 18 mcg into inhaler and inhale every morning.     [name] tiZANidine (ZANAFLEX) 2 MG tablet Take 1 tablet by mouth 2 (two) times daily.    [name] acetaminophen (TYLENOL) 325 MG tablet Take 1 tablet (325 mg total) by mouth every 6 (six) hours as needed for mild pain (or Fever >/= 101). (Patient not taking: Reported on 07/26/2017)    . loperamide (IMODIUM) 2 MG capsule One pill after each loose stool; maximum upto 8 pills a day. (Patient not taking: Reported on 07/26/2017) 40 capsule 0  . ondansetron (ZOFRAN ODT) 8 MG disintegrating tablet Take 1 tablet (8 mg total) by mouth every 8 (eight) hours as needed for nausea or vomiting. 20 tablet 3  . oxyCODONE-acetaminophen (PERCOCET/ROXICET) 5-325 MG tablet Take 1 tablet by mouth every 8 (eight) hours as needed for severe pain. 90 tablet 0  . OXYGEN Inhale 2 L into the lungs continuous.     . prochlorperazine (COMPAZINE) 10 MG tablet Take 1 tablet (10 mg total) by mouth every 6 (six) hours as needed for nausea or vomiting. 30 tablet 3   No current facility-administered medications for this visit.      PHYSICAL EXAMINATION: ECOG PERFORMANCE STATUS: 0 - Asymptomatic  BP 122/81   Pulse (!) 103   Temp 97.8 F (36.6 C) (Tympanic)   Resp 20   Ht _0  (1.549 m)   Wt 114 lb (51.7 kg) Comment: 15 lb weight loss  BMI 21.54 kg/m   Filed Weights   07/26/17 1105  Weight: 114 lb (51.7 kg)    GENERAL: Moderately nourished well-developed; Alert, no distress and comfortable. She is alone. She is in a wheelchair. EYES: no pallor or icterus OROPHARYNX: no thrush or ulceration; poor dentition.   NECK: supple, no masses felt LYMPH:  no palpable lymphadenopathy in the cervical, axillary or inguinal regions LUNGS: clear to auscultation and  No wheeze or crackles HEART/CVS: regular rate & rhythm and no murmurs; No lower extremity edema ABDOMEN:abdomen soft, non-tender and normal bowel sounds Musculoskeletal:no cyanosis of digits and no clubbing;  PSYCH: alert & oriented x 3 with fluent speech NEURO: no focal motor/sensory deficits SKIN:  no rashes or significant lesions   LABORATORY DATA:  I have reviewed the data as listed    Component Value Date/Time   NA 136 07/26/2017 1053   NA 133 (L) 04/08/2014 1738   K 5.6 (H) 07/26/2017 1053   K 4.2 04/08/2014 1738   CL 102 07/26/2017 1053   CL 99 04/08/2014 1738   CO2 23 07/26/2017 1053   CO2 28 04/08/2014 1738   GLUCOSE 97 07/26/2017 1053   GLUCOSE 116 (H) 04/08/2014 1738   BUN 15 07/26/2017 1053   BUN 5 (L) 04/08/2014 1738   CREATININE 0.80 07/26/2017 1053   CREATININE 0.42 (L) 04/08/2014 1738   CALCIUM 8.0 (L) 07/26/2017 1053   CALCIUM 4.9 06/09/2017 1441   PROT 6.1 (L) 07/26/2017 1053   ALBUMIN 2.6 (L) 07/26/2017 1053   AST 30 07/26/2017 1053   ALT 8 (L) 07/26/2017 1053   ALKPHOS 81 07/26/2017 1053   BILITOT 1.0 07/26/2017 1053   GFRNONAA >60 07/26/2017 1053   GFRNONAA >60 04/08/2014 1738   GFRAA >60 07/26/2017 1053   GFRAA >60 04/08/2014 1738    No results found for: SPEP, UPEP  Lab Results  Component Value Date  WBC 5.8 07/26/2017   NEUTROABS 3.5 07/26/2017   HGB 10.2 (L) 07/26/2017   HCT 31.9 (L) 07/26/2017   MCV 89.2 07/26/2017   PLT 405 07/26/2017      Chemistry      Component Value Date/Time   NA 136 07/26/2017 1053   NA 133 (L) 04/08/2014 1738   K 5.6 (H) 07/26/2017 1053   K 4.2 04/08/2014 1738   CL 102 07/26/2017 1053   CL 99 04/08/2014 1738   CO2 23 07/26/2017 1053   CO2 28 04/08/2014 1738   BUN 15 07/26/2017 1053   BUN 5 (L) 04/08/2014 1738   CREATININE 0.80 07/26/2017 1053   CREATININE 0.42 (L) 04/08/2014 1738      Component Value Date/Time   CALCIUM 8.0 (L) 07/26/2017 1053   CALCIUM 4.9 06/09/2017 1441   ALKPHOS 81 07/26/2017 1053   AST 30 07/26/2017 1053   ALT 8 (L) 07/26/2017 1053   BILITOT 1.0 07/26/2017 1053       RADIOGRAPHIC STUDIES: I have personally reviewed the radiological images as listed and agreed with the findings in the report. No results found.   ASSESSMENT & PLAN:  Carcinoma of overlapping sites of left breast in female, estrogen receptor positive ([place]) # Metastatic breast cancer/lobular ER/PR positive HER-2/neu negative-July-AUG 2018- progression in the bone L3; also abdomen causing extrinsic compression of the small bowel [status post resection-C discussion below].  Patient currently on Faslodex;  On Verzinio 150 mg BID-[started appx 4 week ago]  #Profound weight loss weight loss- ? Increasing nausea/vomitting-questions for progression intra-abdominal.  Recommend CT abdomen pelvis stat.  #Recommend starting chemotherapy Taxol single agent Q weekly.  Discussed with the patient; she understands treatments of palliative not curative.    Discussed the potential side effects including but not limited to-increasing fatigue, nausea vomiting, diarrhea, hair loss, sores in the mouth, increase risk of infection and also neuropathy.   # Lytic lesion at L3- positive on PET scan. Lumbar spine shows L3 lesion- on RT [last treatment on 8/27]; no clinical  evidence of progression.  We will need follow-up imaging.  # Bone metastases-s/p X-geva; HOLD today because of severe hypocalcemia/multiple electrolyte abnormalities.  # Neck pain/bone pain from malignancy- continue oxycodone. New prescription given.  # Hypocalcemia- Ca- 8.0; HOLD x-geva; continue vit D.   # Hyperkalemia- 5.6 monitor for now. Will recheck at next visit.   # Hypomagnesemia- reluctant with infusion; recommend PO mag-ca BID.   # # Anemia-multifactorial hemoglobin - improving Hb 10.2/improving. Monitor for now.  #Poor IV access-we will discuss with Dr. Jamal Collin regarding port placement.  We will not delay chemotherapy for port placement.  # CT A/p STAT; taxol chemo- bmp/mid next week.    Orders Placed This Encounter  Procedures  . CT Abdomen Pelvis W Contrast    Standing Status:   Future    Standing Expiration Date:   07/26/2018    Order Specific Question:   If indicated for the ordered procedure, I authorize the administration of contrast media per Radiology protocol    Answer:   Yes    Order Specific Question:   Preferred imaging location?    Answer:   [facility]    Order Specific Question:   Radiology Contrast Protocol - do NOT remove file path    Answer:   [email or web address]    Order Specific Question:   Reason for Exam additional comments    Answer:   breats cancer; nausea/vomitting  . Basic metabolic panel    Standing  Status:   Future    Standing Expiration Date:   07/26/2018  . Ambulatory referral to General Surgery    Referral Priority:   Urgent    Referral Type:   Surgical    Referral Reason:   Specialty Services Required    Referred to Provider:   Christene Lye, MD    Requested Specialty:   General Surgery    Number of Visits Requested:   1  . Amb Referral to Nutrition and Diabetic E    Referral Priority:   Urgent    Referral Type:   Consultation    Referral Reason:   Specialty Services Required     Number of Visits Requested:   [place], MD 07/29/2017 8:11 AM

## 2017-07-26 NOTE — Assessment & Plan Note (Addendum)
#  Metastatic breast cancer/lobular ER/PR positive HER-2/neu negative-July-AUG 2018- progression in the bone L3; also abdomen causing extrinsic compression of the small bowel [status post resection-C discussion below].  Patient currently on Faslodex;  On Verzinio 150 mg BID-[started appx 4 week ago]  #Profound weight loss weight loss- ? Increasing nausea/vomitting-questions for progression intra-abdominal.  Recommend CT abdomen pelvis stat.  #Recommend starting chemotherapy Taxol single agent Q weekly.  Discussed with the patient; she understands treatments of palliative not curative.    Discussed the potential side effects including but not limited to-increasing fatigue, nausea vomiting, diarrhea, hair loss, sores in the mouth, increase risk of infection and also neuropathy.   # Lytic lesion at L3- positive on PET scan. Lumbar spine shows L3 lesion- on RT [last treatment on 8/27]; no clinical evidence of progression.  We will need follow-up imaging.  # Bone metastases-s/p X-geva; HOLD today because of severe hypocalcemia/multiple electrolyte abnormalities.  # Neck pain/bone pain from malignancy- continue oxycodone. New prescription given.  # Hypocalcemia- Ca- 8.0; HOLD x-geva; continue vit D.   # Hyperkalemia- 5.6 monitor for now. Will recheck at next visit.   # Hypomagnesemia- reluctant with infusion; recommend PO mag-ca BID.   # # Anemia-multifactorial hemoglobin - improving Hb 10.2/improving. Monitor for now.  #Poor IV access-we will discuss with Dr. Jamal Collin regarding port placement.  We will not delay chemotherapy for port placement.  # CT A/p STAT; taxol chemo- bmp/mid next week.

## 2017-07-29 ENCOUNTER — Encounter: Payer: Self-pay | Admitting: General Surgery

## 2017-07-29 ENCOUNTER — Ambulatory Visit (INDEPENDENT_AMBULATORY_CARE_PROVIDER_SITE_OTHER): Payer: Medicare HMO | Admitting: General Surgery

## 2017-07-29 ENCOUNTER — Ambulatory Visit
Admission: RE | Admit: 2017-07-29 | Discharge: 2017-07-29 | Disposition: A | Discharge status: Home / Self Care | Payer: Medicare HMO | Source: Ambulatory Visit | Attending: Internal Medicine | Admitting: Internal Medicine

## 2017-07-29 VITALS — BP 104/68 | HR 74 | Resp 14 | Ht 61.0 in | Wt 114.0 lb

## 2017-07-29 DIAGNOSIS — K760 Fatty (change of) liver, not elsewhere classified: Secondary | ICD-10-CM | POA: Diagnosis not present

## 2017-07-29 DIAGNOSIS — I700 Atherosclerosis of aorta: Secondary | ICD-10-CM | POA: Diagnosis not present

## 2017-07-29 DIAGNOSIS — Z170 Estrogen receptor positive status [ER+]: Secondary | ICD-10-CM | POA: Diagnosis not present

## 2017-07-29 DIAGNOSIS — C50812 Malignant neoplasm of overlapping sites of left female breast: Secondary | ICD-10-CM | POA: Insufficient documentation

## 2017-07-29 DIAGNOSIS — Z9884 Bariatric surgery status: Secondary | ICD-10-CM | POA: Diagnosis not present

## 2017-07-29 MED ORDER — IOPAMIDOL (ISOVUE-300) INJECTION 61%
100.0000 mL | Freq: Once | INTRAVENOUS | Status: AC | PRN
  Administered 2017-07-29: 80 mL via INTRAVENOUS

## 2017-07-29 NOTE — H&P (View-Only) (Signed)
Patient ID: Sophia Nelson, female   DOB: 1946/01/28, 71 y.o.   MRN: [id number]  Chief Complaint  Patient presents with  . Other    HPI Sophia Nelson is a 71 y.o. female here for evaluation of port placement referred here by Dr Sophia Nelson. She is here today with her son, Sophia Nelson. She was unable to take the antihormone treatment due to multiple side effects.   HPI  Past Medical History:  Diagnosis Date  . Abdominal distention   . AKI (acute kidney injury) ([place]) 02/24/2017  . Anemia   . Arthritis   . Asthma   . Back pain, chronic 03/15/2017  . Breast cancer ([place]) 2009   RT MASTECTOMY, DCIS  . 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 Cancer of left breast ([place]) 08/09/2015   T4 N1 M1 tumor, INVASIVE LOBULAR CARCINOMA.  . Carcinoma of overlapping sites of left breast in female, estrogen receptor positive ([place]) 03/22/2016  . COPD (chronic obstructive pulmonary disease) ([place])   . Diabetes ([place]) 02/24/2017  . Diabetes mellitus without complication ([place])   . Duodenal obstruction   . Encounter for nasogastric (NG) tube placement   . Gastric outlet obstruction 02/24/2017  . GERD (gastroesophageal reflux disease)   . Headache    h/o migraines as a child  . Hypertension   . Neck pain 04/19/2016  . Pancreatitis, acute 03/04/2017  . Pneumonia 2015  . Recurrent low back pain 03/15/2017  . Shortness of breath dyspnea    with exertion    Past Surgical History:  Procedure Laterality Date  . ABDOMINAL HYSTERECTOMY    . BREAST SURGERY Right 2015   mastectomy  . ESOPHAGOGASTRODUODENOSCOPY N/A 02/25/2017   Procedure: ESOPHAGOGASTRODUODENOSCOPY (EGD);  Surgeon: Jonathon Bellows, MD;  Location[facility] ENDOSCOPY;  Service: Endoscopy;  Laterality: N/A;  . ESOPHAGOGASTRODUODENOSCOPY (EGD) WITH PROPOFOL N/A 03/06/2017   Procedure: ESOPHAGOGASTRODUODENOSCOPY (EGD) WITH PROPOFOL;  Surgeon: [name], MD;  Location: [facility]  ENDOSCOPY;  Service: Endoscopy;  Laterality: N/A;  . GASTROJEJUNOSTOMY N/A 03/08/2017   Procedure: Roux-en-Y gastrojejunostomy Bypass of Gastric Outlet Obstruction, small bowel resection;  Surgeon: Vickie Epley, MD;  Location: [facility] ORS;  Service: General;  Laterality: N/A;  . SIMPLE MASTECTOMY WITH AXILLARY SENTINEL NODE BIOPSY Left 02/29/2016   Procedure: SIMPLE MASTECTOMY;  Surgeon: Christene Lye, MD;  Location: [facility] ORS;  Service: General;  Laterality: Left;  . TUBAL LIGATION      Family History  Problem Relation Age of Onset  . Lung cancer Father   . Cancer Maternal Aunt   . Breast cancer Neg Hx     Social History Social History   Tobacco Use  . Smoking status: Current Some Day Smoker    Packs/day: 0.25    Years: 15.00    Pack years: 3.75    Types: Cigarettes    Last attempt to quit: 02/25/2016    Years since quitting: 1.4  . Smokeless tobacco: Never Used  . Tobacco comment: currently as of 02-21-16 pt states she is only smoking 2-3 cigarettes per day  Substance Use Topics  . 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 Drug use: No    Allergies  Allergen Reactions  . Other     Onions(that grow in the yard-pt can eat onions  without problems) and dust mite Sneezing, cough, runny nose    Current Outpatient Medications  Medication Sig Dispense Refill  . acetaminophen (TYLENOL) 325 MG tablet Take 1 tablet (325 mg total) by mouth every 6 (six) hours as needed for mild pain (or Fever >/= 101).    . ADVAIR DISKUS 500-50 MCG/DOSE AEPB Inhale 1 puff into the lungs 2 (two) times daily.     [name] albuterol (PROVENTIL HFA;VENTOLIN HFA) 108 (90 Base) MCG/ACT inhaler Inhale 2 puffs into the lungs every 6 (six) hours as needed for wheezing or shortness of breath.    [name] aspirin 81 MG tablet Take 81 mg by mouth daily.    [name] atorvastatin (LIPITOR) 10 MG tablet Take 10  mg by mouth daily.    [name] docusate sodium (COLACE) 100 MG capsule Take 100 mg by mouth 2 (two) times daily.    . ergocalciferol (VITAMIN D2) 50000 units capsule Take 1 capsule (50,000 Units total) by mouth once a week. 12 capsule 1  . feeding supplement, ENSURE ENLIVE, (ENSURE ENLIVE) LIQD Take 237 mLs by mouth 3 (three) times daily between meals. 237 mL 12  . fluticasone (FLONASE) 50 MCG/ACT nasal spray Place 2 sprays into both nostrils daily.    [name] guaiFENesin (MUCINEX) 600 MG 12 hr tablet Take 1 tablet (600 mg total) by mouth 2 (two) times daily as needed for cough or to loosen phlegm.    . lidocaine-prilocaine (EMLA) cream Apply 1 application topically as needed.    . loperamide (IMODIUM) 2 MG capsule One pill after each loose stool; maximum upto 8 pills a day. 40 capsule 0  . loratadine (CLARITIN) 10 MG tablet Take 10 mg by mouth daily.    . Magnesium Cl-Calcium Carbonate (SLOW MAGNESIUM/CALCIUM) 70-117 MG TBEC Take 1 tablet by mouth 2 (two) times daily. 60 tablet 6  . metoCLOPramide (REGLAN) 10 MG tablet Take 1 tablet (10 mg total) by mouth 4 (four) times daily. 120 tablet 3  . montelukast (SINGULAIR) 10 MG tablet Take 10 mg by mouth at bedtime.    [name] omeprazole (PRILOSEC) 20 MG capsule Take 20 mg by mouth every morning.     . ondansetron (ZOFRAN ODT) 8 MG disintegrating tablet Take 1 tablet (8 mg total) by mouth every 8 (eight) hours as needed for nausea or vomiting. 20 tablet 3  . oxyCODONE-acetaminophen (PERCOCET/ROXICET) 5-325 MG tablet Take 1 tablet by mouth every 8 (eight) hours as needed for severe pain. 90 tablet 0  . OXYGEN Inhale 2 L into the lungs continuous.     . prochlorperazine (COMPAZINE) 10 MG tablet Take 1 tablet (10 mg total) by mouth every 6 (six) hours as needed for nausea or vomiting. 30 tablet 3  . senna-docusate (SENOKOT-S) 8.6-50 MG tablet Take 1 tablet by mouth at bedtime as needed for mild constipation.    [name] SPIRIVA HANDIHALER 18 MCG inhalation capsule Place 18 mcg into  inhaler and inhale every morning.     [name] tiZANidine (ZANAFLEX) 2 MG tablet Take 1 tablet by mouth 2 (two) times daily.     No current facility-administered medications for this visit.     Review of Systems Review of Systems  Constitutional: Negative.   Respiratory: Negative.   Cardiovascular: Negative.     Blood pressure 104/68, pulse 74, resp. rate 14, height 5\' 1"  (1.549 m), weight 114 lb (51.7 kg).  Physical Exam Physical Exam  Constitutional: She is oriented to person, place, and time. She appears well-developed and well-nourished.  Eyes: Conjunctivae  are normal. No scleral icterus.  Neck: Neck supple.  Cardiovascular: Normal rate, regular rhythm and normal heart sounds.  Pulmonary/Chest: Effort normal and breath sounds normal.  Lymphadenopathy:    She has no cervical adenopathy.  Neurological: She is alert and oriented to person, place, and time.  Skin: Skin is warm and dry.  Psychiatric: She has a normal mood and affect.    Data Reviewed Oncology notes  Assessment    Stage 4 invasive lobular CA left breast. Unable to tolerate antihormonal therapy. Oncology plan for weekly Taxol. Port requested.    Plan    Pt advised on port , procedure/risks and benefits explained. Pt is agreeable.    HPI, Physical Exam, Assessment and Plan have been scribed under the direction and in the presence of Mckinley Jewel, MD  Concepcion Living, LPN I have completed the exam and reviewed the above documentation for accuracy and completeness.  I agree with the above.  Haematologist has been used and any errors in dictation or transcription are unintentional.  Seeplaputhur G. Jamal Collin, M.D., F.A.C.S.   Junie Panning G 07/29/2017, 9:41 AM  Patient's surgery has been scheduled for 08-01-17 at [facility]. It is okay for patient to continue an 81 mg aspirin once daily.    [place], CMA

## 2017-07-29 NOTE — Progress Notes (Signed)
Patient ID: Sophia Nelson, female   DOB: 11-26-1945, 71 y.o.   MRN: [id number]  Chief Complaint  Patient presents with  . Other    HPI Sophia Nelson is a 71 y.o. female here for evaluation of port placement referred here by Dr Tish Men. She is here today with her son, [name]. She was unable to take the antihormone treatment due to multiple side effects.   HPI  Past Medical History:  Diagnosis Date  . Abdominal distention   . AKI (acute kidney injury) ([place]) 02/24/2017  . Anemia   . Arthritis   . Asthma   . Back pain, chronic 03/15/2017  . Breast cancer ([place]) 2009   RT MASTECTOMY, DCIS  . Cancer of intra-abdominal ([place])    Metastatic breast cancer lobular carcinoma wth recurrence in the abdomen status post resection  . Cancer of left breast ([place]) 08/09/2015   T4 N1 M1 tumor, INVASIVE LOBULAR CARCINOMA.  . Carcinoma of overlapping sites of left breast in female, estrogen receptor positive ([place]) 03/22/2016  . COPD (chronic obstructive pulmonary disease) ([place])   . Diabetes ([place]) 02/24/2017  . Diabetes mellitus without complication ([place])   . Duodenal obstruction   . Encounter for nasogastric (NG) tube placement   . Gastric outlet obstruction 02/24/2017  . GERD (gastroesophageal reflux disease)   . Headache    h/o migraines as a child  . Hypertension   . Neck pain 04/19/2016  . Pancreatitis, acute 03/04/2017  . Pneumonia 2015  . Recurrent low back pain 03/15/2017  . Shortness of breath dyspnea    with exertion    Past Surgical History:  Procedure Laterality Date  . ABDOMINAL HYSTERECTOMY    . BREAST SURGERY Right 2015   mastectomy  . ESOPHAGOGASTRODUODENOSCOPY N/A 02/25/2017   Procedure: ESOPHAGOGASTRODUODENOSCOPY (EGD);  Surgeon: Jonathon Bellows, MD;  Location: [facility] ENDOSCOPY;  Service: Endoscopy;  Laterality: N/A;  . ESOPHAGOGASTRODUODENOSCOPY (EGD) WITH PROPOFOL N/A 03/06/2017   Procedure: ESOPHAGOGASTRODUODENOSCOPY (EGD) WITH PROPOFOL;  Surgeon: [name], MD;  Location: [facility]  ENDOSCOPY;  Service: Endoscopy;  Laterality: N/A;  . GASTROJEJUNOSTOMY N/A 03/08/2017   Procedure: Roux-en-Y gastrojejunostomy Bypass of Gastric Outlet Obstruction, small bowel resection;  Surgeon: Vickie Epley, MD;  Location: [facility] ORS;  Service: General;  Laterality: N/A;  . SIMPLE MASTECTOMY WITH AXILLARY SENTINEL NODE BIOPSY Left 02/29/2016   Procedure: SIMPLE MASTECTOMY;  Surgeon: Christene Lye, MD;  Location: [facility] ORS;  Service: General;  Laterality: Left;  . TUBAL LIGATION      Family History  Problem Relation Age of Onset  . Lung cancer Father   . Cancer Maternal Aunt   . Breast cancer Neg Hx     Social History Social History   Tobacco Use  . Smoking status: Current Some Day Smoker    Packs/day: 0.25    Years: 15.00    Pack years: 3.75    Types: Cigarettes    Last attempt to quit: 02/25/2016    Years since quitting: 1.4  . Smokeless tobacco: Never Used  . Tobacco comment: currently as of 02-21-16 pt states she is only smoking 2-3 cigarettes per day  Substance Use Topics  . 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 Drug use: No    Allergies  Allergen Reactions  . Other     Onions(that grow in the yard-pt can eat onions  without problems) and dust mite Sneezing, cough, runny nose    Current Outpatient Medications  Medication Sig Dispense Refill  . acetaminophen (TYLENOL) 325 MG tablet Take 1 tablet (325 mg total) by mouth every 6 (six) hours as needed for mild pain (or Fever >/= 101).    . ADVAIR DISKUS 500-50 MCG/DOSE AEPB Inhale 1 puff into the lungs 2 (two) times daily.     [name] albuterol (PROVENTIL HFA;VENTOLIN HFA) 108 (90 Base) MCG/ACT inhaler Inhale 2 puffs into the lungs every 6 (six) hours as needed for wheezing or shortness of breath.    [name] aspirin 81 MG tablet Take 81 mg by mouth daily.    [name] atorvastatin (LIPITOR) 10 MG tablet Take 10  mg by mouth daily.    [name] docusate sodium (COLACE) 100 MG capsule Take 100 mg by mouth 2 (two) times daily.    . ergocalciferol (VITAMIN D2) 50000 units capsule Take 1 capsule (50,000 Units total) by mouth once a week. 12 capsule 1  . feeding supplement, ENSURE ENLIVE, (ENSURE ENLIVE) LIQD Take 237 mLs by mouth 3 (three) times daily between meals. 237 mL 12  . fluticasone (FLONASE) 50 MCG/ACT nasal spray Place 2 sprays into both nostrils daily.    [name] guaiFENesin (MUCINEX) 600 MG 12 hr tablet Take 1 tablet (600 mg total) by mouth 2 (two) times daily as needed for cough or to loosen phlegm.    . lidocaine-prilocaine (EMLA) cream Apply 1 application topically as needed.    . loperamide (IMODIUM) 2 MG capsule One pill after each loose stool; maximum upto 8 pills a day. 40 capsule 0  . loratadine (CLARITIN) 10 MG tablet Take 10 mg by mouth daily.    . Magnesium Cl-Calcium Carbonate (SLOW MAGNESIUM/CALCIUM) 70-117 MG TBEC Take 1 tablet by mouth 2 (two) times daily. 60 tablet 6  . metoCLOPramide (REGLAN) 10 MG tablet Take 1 tablet (10 mg total) by mouth 4 (four) times daily. 120 tablet 3  . montelukast (SINGULAIR) 10 MG tablet Take 10 mg by mouth at bedtime.    [name] omeprazole (PRILOSEC) 20 MG capsule Take 20 mg by mouth every morning.     . ondansetron (ZOFRAN ODT) 8 MG disintegrating tablet Take 1 tablet (8 mg total) by mouth every 8 (eight) hours as needed for nausea or vomiting. 20 tablet 3  . oxyCODONE-acetaminophen (PERCOCET/ROXICET) 5-325 MG tablet Take 1 tablet by mouth every 8 (eight) hours as needed for severe pain. 90 tablet 0  . OXYGEN Inhale 2 L into the lungs continuous.     . prochlorperazine (COMPAZINE) 10 MG tablet Take 1 tablet (10 mg total) by mouth every 6 (six) hours as needed for nausea or vomiting. 30 tablet 3  . senna-docusate (SENOKOT-S) 8.6-50 MG tablet Take 1 tablet by mouth at bedtime as needed for mild constipation.    [name] SPIRIVA HANDIHALER 18 MCG inhalation capsule Place 18 mcg into  inhaler and inhale every morning.     [name] tiZANidine (ZANAFLEX) 2 MG tablet Take 1 tablet by mouth 2 (two) times daily.     No current facility-administered medications for this visit.     Review of Systems Review of Systems  Constitutional: Negative.   Respiratory: Negative.   Cardiovascular: Negative.     Blood pressure 104/68, pulse 74, resp. rate 14, height 5\' 1"  (1.549 m), weight 114 lb (51.7 kg).  Physical Exam Physical Exam  Constitutional: She is oriented to person, place, and time. She appears well-developed and well-nourished.  Eyes: Conjunctivae  are normal. No scleral icterus.  Neck: Neck supple.  Cardiovascular: Normal rate, regular rhythm and normal heart sounds.  Pulmonary/Chest: Effort normal and breath sounds normal.  Lymphadenopathy:    She has no cervical adenopathy.  Neurological: She is alert and oriented to person, place, and time.  Skin: Skin is warm and dry.  Psychiatric: She has a normal mood and affect.    Data Reviewed Oncology notes  Assessment    Stage 4 invasive lobular CA left breast. Unable to tolerate antihormonal therapy. Oncology plan for weekly Taxol. Port requested.    Plan    Pt advised on port , procedure/risks and benefits explained. Pt is agreeable.    HPI, Physical Exam, Assessment and Plan have been scribed under the direction and in the presence of Mckinley Jewel, MD  Concepcion Living, LPN I have completed the exam and reviewed the above documentation for accuracy and completeness.  I agree with the above.  Haematologist has been used and any errors in dictation or transcription are unintentional.  Liel Rudden G. Jamal Collin, M.D., F.A.C.S.   Junie Panning G 07/29/2017, 9:41 AM  Patient's surgery has been scheduled for 08-01-17 at [facility]. It is okay for patient to continue an 81 mg aspirin once daily.    Dominga Ferry, CMA

## 2017-07-29 NOTE — Patient Instructions (Signed)
Implanted Port Insertion  Implanted port insertion is a procedure to put in a port and catheter. The port is a device with an injectable disk that can be accessed by your health care provider. The port is connected to a vein in the chest or neck by a small flexible tube (catheter). There are different types of ports. The implanted port may be used as a long-term IV access for:  · Medicines, such as chemotherapy.  · Fluids.  · Liquid nutrition, such as total parenteral nutrition (TPN).  · Blood samples.    Having a port means that your health care provider will not need to use the veins in your arms for these procedures.  Tell a health care provider about:  · Any allergies you have.  · All medicines you are taking, especially blood thinners, as well as any vitamins, herbs, eye drops, creams, over-the-counter medicines, and steroids.  · Any problems you or family members have had with anesthetic medicines.  · Any blood disorders you have.  · Any surgeries you have had.  · Any medical conditions you have, including diabetes or kidney problems.  · Whether you are pregnant or may be pregnant.  What are the risks?  Generally, this is a safe procedure. However, problems may occur, including:  · Allergic reactions to medicines or dyes.  · Damage to other structures or organs.  · Infection.  · Damage to the blood vessel, bruising, or bleeding at the puncture site.  · Blood clot.  · Breakdown of the skin over the port.  · A collection of air in the chest that can cause one of the lungs to collapse (pneumothorax). This is rare.    What happens before the procedure?  Staying hydrated  Follow instructions from your health care provider about hydration, which may include:  · Up to 2 hours before the procedure - you may continue to drink clear liquids, such as water, clear fruit juice, black coffee, and plain tea.    Eating and drinking restrictions  · Follow instructions from your health care provider about eating and drinking,  which may include:  ? 8 hours before the procedure - stop eating heavy meals or foods such as meat, fried foods, or fatty foods.  ? 6 hours before the procedure - stop eating light meals or foods, such as toast or cereal.  ? 6 hours before the procedure - stop drinking milk or drinks that contain milk.  ? 2 hours before the procedure - stop drinking clear liquids.  Medicines  · Ask your health care provider about:  ? Changing or stopping your regular medicines. This is especially important if you are taking diabetes medicines or blood thinners.  ? Taking medicines such as aspirin and ibuprofen. These medicines can thin your blood. Do not take these medicines before your procedure if your health care provider instructs you not to.  · You may be given antibiotic medicine to help prevent infection.  General instructions  · Plan to have someone take you home from the hospital or clinic.  · If you will be going home right after the procedure, plan to have someone with you for 24 hours.  · You may have blood tests.  · You may be asked to shower with a germ-killing soap.  What happens during the procedure?  · To lower your risk of infection:  ? Your health care team will wash or sanitize their hands.  ? Your skin will be washed with   soap.  ? Hair may be removed from the surgical area.  · An IV tube will be inserted into one of your veins.  · You will be given one or more of the following:  ? A medicine to help you relax (sedative).  ? A medicine to numb the area (local anesthetic).  · Two small cuts (incisions) will be made to insert the port.  ? One incision will be made in your neck to get access to the vein where the catheter will lie.  ? The other incision will be made in the upper chest. This is where the port will lie.  · The procedure may be done using continuous X-ray (fluoroscopy) or other imaging tools for guidance.  · The port and catheter will be placed. There may be a small, raised area where the port  is.  · The port will be flushed with a salt solution (saline), and blood will be drawn to make sure that it is working correctly.  · The incisions will be closed.  · Bandages (dressings) may be placed over the incisions.  The procedure may vary among health care providers and hospitals.  What happens after the procedure?  · Your blood pressure, heart rate, breathing rate, and blood oxygen level will be monitored until the medicines you were given have worn off.  · Do not drive for 24 hours if you were given a sedative.  · You will be given a manufacturer's information card for the type of port that you have. Keep this with you.  · Your port will need to be flushed and checked as told by your health care provider, usually every few weeks.  · A chest X-ray will be done to:  ? Check the placement of the port.  ? Make sure there is no injury to your lung.  Summary  · Implanted port insertion is a procedure to put in a port and catheter.  · The implanted port is used as a long-term IV access.  · The port will need to be flushed and checked as told by your health care provider, usually every few weeks.  · Keep your manufacturer's information card with you at all times.  This information is not intended to replace advice given to you by your health care provider. Make sure you discuss any questions you have with your health care provider.  Document Released: 06/03/2013 Document Revised: 07/04/2016 Document Reviewed: 07/04/2016  Elsevier Interactive Patient Education © 2017 Elsevier Inc.

## 2017-07-29 NOTE — Patient Instructions (Signed)
Paclitaxel injection What is this medicine? PACLITAXEL (PAK li TAX el) is a chemotherapy drug. It targets fast dividing cells, like cancer cells, and causes these cells to die. This medicine is used to treat ovarian cancer, breast cancer, and other cancers. This medicine may be used for other purposes; ask your health care provider or pharmacist if you have questions. COMMON BRAND NAME(S): Onxol, Taxol What should I tell my health care provider before I take this medicine? They need to know if you have any of these conditions: -blood disorders -irregular heartbeat -infection (especially a virus infection such as chickenpox, cold sores, or herpes) -liver disease -previous or ongoing radiation therapy -an unusual or allergic reaction to paclitaxel, alcohol, polyoxyethylated castor oil, other chemotherapy agents, other medicines, foods, dyes, or preservatives -pregnant or trying to get pregnant -breast-feeding How should I use this medicine? This drug is given as an infusion into a vein. It is administered in a hospital or clinic by a specially trained health care professional. Talk to your pediatrician regarding the use of this medicine in children. Special care may be needed. Overdosage: If you think you have taken too much of this medicine contact a poison control center or emergency room at once. NOTE: This medicine is only for you. Do not share this medicine with others. What if I miss a dose? It is important not to miss your dose. Call your doctor or health care professional if you are unable to keep an appointment. What may interact with this medicine? Do not take this medicine with any of the following medications: -disulfiram -metronidazole This medicine may also interact with the following medications: -cyclosporine -diazepam -ketoconazole -medicines to increase blood counts like filgrastim, pegfilgrastim, sargramostim -other chemotherapy drugs like cisplatin, doxorubicin,  epirubicin, etoposide, teniposide, vincristine -quinidine -testosterone -vaccines -verapamil Talk to your doctor or health care professional before taking any of these medicines: -acetaminophen -aspirin -ibuprofen -ketoprofen -naproxen This list may not describe all possible interactions. Give your health care provider a list of all the medicines, herbs, non-prescription drugs, or dietary supplements you use. Also tell them if you smoke, drink alcohol, or use illegal drugs. Some items may interact with your medicine. What should I watch for while using this medicine? Your condition will be monitored carefully while you are receiving this medicine. You will need important blood work done while you are taking this medicine. This medicine can cause serious allergic reactions. To reduce your risk you will need to take other medicine(s) before treatment with this medicine. If you experience allergic reactions like skin rash, itching or hives, swelling of the face, lips, or tongue, tell your doctor or health care professional right away. In some cases, you may be given additional medicines to help with side effects. Follow all directions for their use. This drug may make you feel generally unwell. This is not uncommon, as chemotherapy can affect healthy cells as well as cancer cells. Report any side effects. Continue your course of treatment even though you feel ill unless your doctor tells you to stop. Call your doctor or health care professional for advice if you get a fever, chills or sore throat, or other symptoms of a cold or flu. Do not treat yourself. This drug decreases your body's ability to fight infections. Try to avoid being around people who are sick. This medicine may increase your risk to bruise or bleed. Call your doctor or health care professional if you notice any unusual bleeding. Be careful brushing and flossing your teeth or   using a toothpick because you may get an infection or  bleed more easily. If you have any dental work done, tell your dentist you are receiving this medicine. Avoid taking products that contain aspirin, acetaminophen, ibuprofen, naproxen, or ketoprofen unless instructed by your doctor. These medicines may hide a fever. Do not become pregnant while taking this medicine. Women should inform their doctor if they wish to become pregnant or think they might be pregnant. There is a potential for serious side effects to an unborn child. Talk to your health care professional or pharmacist for more information. Do not breast-feed an infant while taking this medicine. Men are advised not to father a child while receiving this medicine. This product may contain alcohol. Ask your pharmacist or healthcare provider if this medicine contains alcohol. Be sure to tell all healthcare providers you are taking this medicine. Certain medicines, like metronidazole and disulfiram, can cause an unpleasant reaction when taken with alcohol. The reaction includes flushing, headache, nausea, vomiting, sweating, and increased thirst. The reaction can last from 30 minutes to several hours. What side effects may I notice from receiving this medicine? Side effects that you should report to your doctor or health care professional as soon as possible: -allergic reactions like skin rash, itching or hives, swelling of the face, lips, or tongue -low blood counts - This drug may decrease the number of white blood cells, red blood cells and platelets. You may be at increased risk for infections and bleeding. -signs of infection - fever or chills, cough, sore throat, pain or difficulty passing urine -signs of decreased platelets or bleeding - bruising, pinpoint red spots on the skin, black, tarry stools, nosebleeds -signs of decreased red blood cells - unusually weak or tired, fainting spells, lightheadedness -breathing problems -chest pain -high or low blood pressure -mouth sores -nausea and  vomiting -pain, swelling, redness or irritation at the injection site -pain, tingling, numbness in the hands or feet -slow or irregular heartbeat -swelling of the ankle, feet, hands Side effects that usually do not require medical attention (report to your doctor or health care professional if they continue or are bothersome): -bone pain -complete hair loss including hair on your head, underarms, pubic hair, eyebrows, and eyelashes -changes in the color of fingernails -diarrhea -loosening of the fingernails -loss of appetite -muscle or joint pain -red flush to skin -sweating This list may not describe all possible side effects. Call your doctor for medical advice about side effects. You may report side effects to FDA at 1-800-FDA-1088. Where should I keep my medicine? This drug is given in a hospital or clinic and will not be stored at home. NOTE: This sheet is a summary. It may not cover all possible information. If you have questions about this medicine, talk to your doctor, pharmacist, or health care provider.  2018 Elsevier/Gold Standard (2015-06-14 19:58:00)  

## 2017-07-29 NOTE — Progress Notes (Signed)
START ON PATHWAY REGIMEN - Breast     A cycle is every 28 days (3 weeks on and 1 week off):     Paclitaxel   **Always confirm dose/schedule in your pharmacy ordering system**    Patient Characteristics: Metastatic Chemotherapy, HER2 Negative/Unknown/Equivocal, ER Positive, First Line Therapeutic Status: Distant Metastases BRCA Mutation Status: Awaiting Test Results ER Status: Positive (+) HER2 Status: Negative (-) Would you be surprised if this patient died  in the next year<= I would be surprised if this patient died in the next year PR Status: Positive (+) Line of therapy: First Line Intent of Therapy: Non-Curative / Palliative Intent, Discussed with Patient

## 2017-07-30 ENCOUNTER — Telehealth: Payer: Self-pay

## 2017-07-30 ENCOUNTER — Inpatient Hospital Stay: Payer: Medicare HMO | Attending: Internal Medicine

## 2017-07-30 ENCOUNTER — Other Ambulatory Visit: Payer: Self-pay

## 2017-07-30 ENCOUNTER — Encounter
Admission: RE | Admit: 2017-07-30 | Discharge: 2017-07-30 | Disposition: A | Discharge status: Home / Self Care | Payer: Medicare HMO | Source: Ambulatory Visit | Attending: General Surgery | Admitting: General Surgery

## 2017-07-30 DIAGNOSIS — Z170 Estrogen receptor positive status [ER+]: Secondary | ICD-10-CM | POA: Insufficient documentation

## 2017-07-30 DIAGNOSIS — K219 Gastro-esophageal reflux disease without esophagitis: Secondary | ICD-10-CM | POA: Diagnosis not present

## 2017-07-30 DIAGNOSIS — C772 Secondary and unspecified malignant neoplasm of intra-abdominal lymph nodes: Secondary | ICD-10-CM | POA: Insufficient documentation

## 2017-07-30 DIAGNOSIS — C50912 Malignant neoplasm of unspecified site of left female breast: Secondary | ICD-10-CM | POA: Diagnosis not present

## 2017-07-30 DIAGNOSIS — R630 Anorexia: Secondary | ICD-10-CM | POA: Insufficient documentation

## 2017-07-30 DIAGNOSIS — E119 Type 2 diabetes mellitus without complications: Secondary | ICD-10-CM | POA: Insufficient documentation

## 2017-07-30 DIAGNOSIS — R112 Nausea with vomiting, unspecified: Secondary | ICD-10-CM | POA: Insufficient documentation

## 2017-07-30 DIAGNOSIS — Z9884 Bariatric surgery status: Secondary | ICD-10-CM | POA: Diagnosis not present

## 2017-07-30 DIAGNOSIS — Z5111 Encounter for antineoplastic chemotherapy: Secondary | ICD-10-CM | POA: Insufficient documentation

## 2017-07-30 DIAGNOSIS — I10 Essential (primary) hypertension: Secondary | ICD-10-CM | POA: Insufficient documentation

## 2017-07-30 DIAGNOSIS — Z87891 Personal history of nicotine dependence: Secondary | ICD-10-CM | POA: Insufficient documentation

## 2017-07-30 DIAGNOSIS — Z9011 Acquired absence of right breast and nipple: Secondary | ICD-10-CM | POA: Diagnosis not present

## 2017-07-30 DIAGNOSIS — D649 Anemia, unspecified: Secondary | ICD-10-CM | POA: Insufficient documentation

## 2017-07-30 DIAGNOSIS — Z90710 Acquired absence of both cervix and uterus: Secondary | ICD-10-CM | POA: Insufficient documentation

## 2017-07-30 DIAGNOSIS — Z7982 Long term (current) use of aspirin: Secondary | ICD-10-CM | POA: Insufficient documentation

## 2017-07-30 DIAGNOSIS — C7951 Secondary malignant neoplasm of bone: Secondary | ICD-10-CM | POA: Insufficient documentation

## 2017-07-30 DIAGNOSIS — J449 Chronic obstructive pulmonary disease, unspecified: Secondary | ICD-10-CM | POA: Diagnosis not present

## 2017-07-30 DIAGNOSIS — Z79899 Other long term (current) drug therapy: Secondary | ICD-10-CM | POA: Diagnosis not present

## 2017-07-30 DIAGNOSIS — Z853 Personal history of malignant neoplasm of breast: Secondary | ICD-10-CM | POA: Insufficient documentation

## 2017-07-30 DIAGNOSIS — R109 Unspecified abdominal pain: Secondary | ICD-10-CM | POA: Insufficient documentation

## 2017-07-30 DIAGNOSIS — M1990 Unspecified osteoarthritis, unspecified site: Secondary | ICD-10-CM | POA: Insufficient documentation

## 2017-07-30 DIAGNOSIS — Z79818 Long term (current) use of other agents affecting estrogen receptors and estrogen levels: Secondary | ICD-10-CM | POA: Insufficient documentation

## 2017-07-30 DIAGNOSIS — F17210 Nicotine dependence, cigarettes, uncomplicated: Secondary | ICD-10-CM | POA: Diagnosis not present

## 2017-07-30 DIAGNOSIS — G893 Neoplasm related pain (acute) (chronic): Secondary | ICD-10-CM | POA: Insufficient documentation

## 2017-07-30 DIAGNOSIS — R634 Abnormal weight loss: Secondary | ICD-10-CM | POA: Insufficient documentation

## 2017-07-30 DIAGNOSIS — Z801 Family history of malignant neoplasm of trachea, bronchus and lung: Secondary | ICD-10-CM | POA: Insufficient documentation

## 2017-07-30 DIAGNOSIS — I700 Atherosclerosis of aorta: Secondary | ICD-10-CM | POA: Insufficient documentation

## 2017-07-30 DIAGNOSIS — Z9013 Acquired absence of bilateral breasts and nipples: Secondary | ICD-10-CM | POA: Insufficient documentation

## 2017-07-30 DIAGNOSIS — C50812 Malignant neoplasm of overlapping sites of left female breast: Secondary | ICD-10-CM | POA: Insufficient documentation

## 2017-07-30 LAB — POTASSIUM: POTASSIUM: 4.2 mmol/L (ref 3.5–5.1)

## 2017-07-30 NOTE — Patient Instructions (Signed)
  Your procedure is scheduled XN:ATFTDDUK De. 6th , 2018. Report to Same Day Surgery. To find out your arrival time please call [phone] between 1PM - 3PM on Wednesday Dec. 5, 2018  Remember: Instructions that are not followed completely may result in serious medical risk, up to and including death, or upon the discretion of your surgeon and anesthesiologist your surgery may need to be rescheduled.    _x___ 1. Do not eat food after midnight night prior to surgery. No gum   chewing or hard candies, snacks or breakfast.    Jan 27, 2023 drink the following: water, Gatorade, clear apple juice, black coffee     or black tea up until 2 hours prior to ARRIVAL time.     ____ 2. No Alcohol for 24 hours before or after surgery.   ____ 3. Bring all medications with you on the day of surgery if instructed.    __x__ 4. Notify your doctor if there is any change in your medical condition     (cold, fever, infections).    ___x__ 5.   Do Not Smoke or use e-cigarettes For 24 Hours Prior to Your   Surgery.  Do not use any chewable tobacco products for at least 6   hours prior to  surgery.                      Do not wear jewelry, make-up, hairpins, clips or nail polish.  Do not wear lotions, powders, or perfumes.   Do not shave 48 hours prior to surgery. Men may shave face and neck.  Do not bring valuables to the hospital.    [facility] is not responsible for any belongings or valuables.               Contacts, dentures or bridgework may not be worn into surgery.  Leave your suitcase in the car. After surgery it may be brought to your room.  For patients admitted to the hospital, discharge time is determined by your  treatment team.   Patients discharged the day of surgery will not be allowed to drive home.    Please read over the following fact sheets that you were given:   [facility] Preparing for Surgery  ____ Take these medicines the morning of surgery with A SIP OF WATER:    1. atorvastatin  (LIPITOR)   2. omeprazole (PRILOSEC) take an extra dose at bedtime the night prior to surgery and am of surgery       ____ Fleet Enema (as directed)   ____ Use CHG Soap as directed on instruction sheet  __x__ Use inhalers on the day of surgery and bring to hospital day of surgery  ____ Stop metformin 2 days prior to surgery    ____ Take 1/2 of usual insulin dose the night before surgery and none on the morning of          surgery.   ____ Stop Eliquis/Coumadin/Plavix/aspirin on does not apply.  _x___ Stop Anti-inflammatories such as Advil, Aleve, Ibuprofen, Motrin, Naproxen,  Naprosyn, Goodies powders or aspirin products. OK to take Tylenol.   ____ Stop supplements until after surgery.    ____ Bring C-Pap to the hospital.

## 2017-07-30 NOTE — Telephone Encounter (Signed)
Patient notified that she missed her pre admit appointment for today. She got it mixed up with another appointment that was canceled. She will call them now to get this rescheduled.

## 2017-08-01 ENCOUNTER — Encounter: Payer: Self-pay | Admitting: *Deleted

## 2017-08-01 ENCOUNTER — Ambulatory Visit: Payer: Medicare HMO

## 2017-08-01 ENCOUNTER — Ambulatory Visit: Payer: Medicare HMO | Admitting: Certified Registered Nurse Anesthetist

## 2017-08-01 ENCOUNTER — Encounter
Admission: RE | Disposition: A | Discharge status: Home / Self Care | Payer: Self-pay | Source: Ambulatory Visit | Attending: General Surgery

## 2017-08-01 ENCOUNTER — Other Ambulatory Visit: Payer: Self-pay

## 2017-08-01 ENCOUNTER — Ambulatory Visit
Admission: RE | Admit: 2017-08-01 | Discharge: 2017-08-01 | Disposition: A | Discharge status: Home / Self Care | Payer: Medicare HMO | Source: Ambulatory Visit | Attending: General Surgery | Admitting: General Surgery

## 2017-08-01 DIAGNOSIS — K219 Gastro-esophageal reflux disease without esophagitis: Secondary | ICD-10-CM | POA: Insufficient documentation

## 2017-08-01 DIAGNOSIS — J449 Chronic obstructive pulmonary disease, unspecified: Secondary | ICD-10-CM | POA: Insufficient documentation

## 2017-08-01 DIAGNOSIS — Z95828 Presence of other vascular implants and grafts: Secondary | ICD-10-CM

## 2017-08-01 DIAGNOSIS — C50912 Malignant neoplasm of unspecified site of left female breast: Secondary | ICD-10-CM | POA: Insufficient documentation

## 2017-08-01 DIAGNOSIS — Z170 Estrogen receptor positive status [ER+]: Secondary | ICD-10-CM | POA: Insufficient documentation

## 2017-08-01 DIAGNOSIS — F17210 Nicotine dependence, cigarettes, uncomplicated: Secondary | ICD-10-CM | POA: Insufficient documentation

## 2017-08-01 DIAGNOSIS — Z79899 Other long term (current) drug therapy: Secondary | ICD-10-CM | POA: Insufficient documentation

## 2017-08-01 DIAGNOSIS — C50812 Malignant neoplasm of overlapping sites of left female breast: Secondary | ICD-10-CM

## 2017-08-01 DIAGNOSIS — I10 Essential (primary) hypertension: Secondary | ICD-10-CM | POA: Insufficient documentation

## 2017-08-01 DIAGNOSIS — Z9884 Bariatric surgery status: Secondary | ICD-10-CM | POA: Insufficient documentation

## 2017-08-01 DIAGNOSIS — E119 Type 2 diabetes mellitus without complications: Secondary | ICD-10-CM | POA: Insufficient documentation

## 2017-08-01 DIAGNOSIS — Z7982 Long term (current) use of aspirin: Secondary | ICD-10-CM | POA: Insufficient documentation

## 2017-08-01 DIAGNOSIS — Z9011 Acquired absence of right breast and nipple: Secondary | ICD-10-CM | POA: Insufficient documentation

## 2017-08-01 HISTORY — PX: PORTACATH PLACEMENT: SHX2246

## 2017-08-01 LAB — GLUCOSE, CAPILLARY
Glucose-Capillary: 96 mg/dL (ref 65–99)
Glucose-Capillary: 100 mg/dL — ABNORMAL HIGH (ref 65–99)

## 2017-08-01 SURGERY — INSERTION, TUNNELED CENTRAL VENOUS DEVICE, WITH PORT
Anesthesia: General | Site: Chest | Laterality: Right | Wound class: Clean

## 2017-08-01 MED ORDER — CEFAZOLIN SODIUM-DEXTROSE 2-4 GM/100ML-% IV SOLN
INTRAVENOUS | Status: AC
  Filled 2017-08-01: qty 100

## 2017-08-01 MED ORDER — PROPOFOL 500 MG/50ML IV EMUL
INTRAVENOUS | Status: AC
  Filled 2017-08-01: qty 50

## 2017-08-01 MED ORDER — FENTANYL CITRATE (PF) 100 MCG/2ML IJ SOLN
INTRAMUSCULAR | Status: AC
  Filled 2017-08-01: qty 2

## 2017-08-01 MED ORDER — ONDANSETRON HCL 4 MG/2ML IJ SOLN: 4.0000 mg | Freq: Once | INTRAMUSCULAR | Status: DC | PRN

## 2017-08-01 MED ORDER — SODIUM CHLORIDE 0.9 % IV SOLN
INTRAVENOUS | Status: DC
  Administered 2017-08-01: 09:00:00 via INTRAVENOUS

## 2017-08-01 MED ORDER — FENTANYL CITRATE (PF) 100 MCG/2ML IJ SOLN: 25.0000 ug | INTRAMUSCULAR | Status: DC | PRN

## 2017-08-01 MED ORDER — PHENYLEPHRINE HCL 10 MG/ML IJ SOLN
INTRAMUSCULAR | Status: DC | PRN
  Administered 2017-08-01: 100 ug via INTRAVENOUS

## 2017-08-01 MED ORDER — BUPIVACAINE HCL (PF) 0.5 % IJ SOLN
INTRAMUSCULAR | Status: AC
  Filled 2017-08-01: qty 30

## 2017-08-01 MED ORDER — PROPOFOL 500 MG/50ML IV EMUL
INTRAVENOUS | Status: DC | PRN
  Administered 2017-08-01: 120 ug/kg/min via INTRAVENOUS

## 2017-08-01 MED ORDER — LIDOCAINE HCL (CARDIAC) 20 MG/ML IV SOLN
INTRAVENOUS | Status: DC | PRN
  Administered 2017-08-01: 40 mg via INTRAVENOUS

## 2017-08-01 MED ORDER — HEPARIN SODIUM (PORCINE) 5000 UNIT/ML IJ SOLN
INTRAMUSCULAR | Status: AC
  Filled 2017-08-01: qty 1

## 2017-08-01 MED ORDER — CEFAZOLIN SODIUM-DEXTROSE 2-4 GM/100ML-% IV SOLN
2.0000 g | INTRAVENOUS | Status: AC
  Administered 2017-08-01: 2 g via INTRAVENOUS

## 2017-08-01 MED ORDER — FENTANYL CITRATE (PF) 100 MCG/2ML IJ SOLN
INTRAMUSCULAR | Status: DC | PRN
  Administered 2017-08-01: 25 ug via INTRAVENOUS

## 2017-08-01 MED ORDER — LIDOCAINE HCL 1 % IJ SOLN
INTRAMUSCULAR | Status: DC | PRN
  Administered 2017-08-01: 22 mL via SUBCUTANEOUS

## 2017-08-01 MED ORDER — CHLORHEXIDINE GLUCONATE CLOTH 2 % EX PADS: 6.0000 | MEDICATED_PAD | Freq: Once | CUTANEOUS | Status: DC

## 2017-08-01 MED ORDER — PROPOFOL 10 MG/ML IV BOLUS
INTRAVENOUS | Status: DC | PRN
  Administered 2017-08-01: 30 mg via INTRAVENOUS

## 2017-08-01 MED ORDER — LIDOCAINE HCL (PF) 1 % IJ SOLN
INTRAMUSCULAR | Status: AC
  Filled 2017-08-01: qty 30

## 2017-08-01 MED ORDER — LIDOCAINE HCL (PF) 2 % IJ SOLN
INTRAMUSCULAR | Status: AC
  Filled 2017-08-01: qty 10

## 2017-08-01 SURGICAL SUPPLY — 29 items
BAG DECANTER FOR FLEXI CONT (MISCELLANEOUS) ×3 IMPLANT
BLADE SURG 15 STRL SS SAFETY (BLADE) ×3 IMPLANT
CANISTER SUCT 1200ML W/VALVE (MISCELLANEOUS) ×3 IMPLANT
CHLORAPREP W/TINT 26ML (MISCELLANEOUS) ×3 IMPLANT
COVER LIGHT HANDLE STERIS (MISCELLANEOUS) ×6 IMPLANT
DECANTER SPIKE VIAL GLASS SM (MISCELLANEOUS) ×6 IMPLANT
DERMABOND ADVANCED (GAUZE/BANDAGES/DRESSINGS) ×2
DERMABOND ADVANCED .7 DNX12 (GAUZE/BANDAGES/DRESSINGS) ×1 IMPLANT
DRAPE C-ARM XRAY 36X54 (DRAPES) ×3 IMPLANT
ELECT REM PT RETURN 9FT ADLT (ELECTROSURGICAL) ×3
ELECTRODE REM PT RTRN 9FT ADLT (ELECTROSURGICAL) ×1 IMPLANT
GLOVE BIO SURGEON STRL SZ7 (GLOVE) ×3 IMPLANT
GOWN STRL REUS W/ TWL LRG LVL3 (GOWN DISPOSABLE) ×2 IMPLANT
GOWN STRL REUS W/TWL LRG LVL3 (GOWN DISPOSABLE) ×4
IV NS 500ML (IV SOLUTION) ×2
IV NS 500ML BAXH (IV SOLUTION) ×1 IMPLANT
KIT PORT POWER 8FR ISP CVUE (Miscellaneous) ×3 IMPLANT
KIT RM TURNOVER STRD PROC AR (KITS) ×3 IMPLANT
LABEL OR SOLS (LABEL) ×3 IMPLANT
NEEDLE FILTER BLUNT 18X 1/2SAF (NEEDLE) ×2
NEEDLE FILTER BLUNT 18X1 1/2 (NEEDLE) ×1 IMPLANT
NEEDLE HYPO 25GX1X1/2 BEV (NEEDLE) ×3 IMPLANT
NS IRRIG 500ML POUR BTL (IV SOLUTION) ×3 IMPLANT
PACK PORT-A-CATH (MISCELLANEOUS) ×3 IMPLANT
SUT PROLENE 2 0 SH DA (SUTURE) ×3 IMPLANT
SUT VIC AB 3-0 SH 27 (SUTURE) ×2
SUT VIC AB 3-0 SH 27X BRD (SUTURE) ×1 IMPLANT
SUT VIC AB 4-0 FS2 27 (SUTURE) ×3 IMPLANT
SYR 3ML LL SCALE MARK (SYRINGE) ×3 IMPLANT

## 2017-08-01 NOTE — Anesthesia Preprocedure Evaluation (Signed)
Anesthesia Evaluation  Patient identified by MRN, date of birth, ID band Patient awake    Reviewed: Allergy & Precautions, NPO status , Patient's Chart, lab work & pertinent test results  History of Anesthesia Complications Negative for: history of anesthetic complications  Airway Mallampati: II       Dental   Pulmonary COPD,  COPD inhaler and oxygen dependent, former smoker,           Cardiovascular hypertension (off meds now), (-) Past MI and (-) CHF (-) dysrhythmias (-) Valvular Problems/Murmurs     Neuro/Psych neg Seizures    GI/Hepatic Neg liver ROS, GERD  Medicated and Controlled,  Endo/Other  diabetes, Type 2, Oral Hypoglycemic Agents  Renal/GU Renal disease (UTI with acute renal injury last hospital visit)     Musculoskeletal   Abdominal   Peds  Hematology   Anesthesia Other Findings   Reproductive/Obstetrics                             Anesthesia Physical Anesthesia Plan  ASA: III  Anesthesia Plan: General   Post-op Pain Management:    Induction: Intravenous  PONV Risk Score and Plan: 3 and Propofol infusion, TIVA and Midazolam  Airway Management Planned: Nasal Cannula and Natural Airway  Additional Equipment:   Intra-op Plan:   Post-operative Plan:   Informed Consent: I have reviewed the patients History and Physical, chart, labs and discussed the procedure including the risks, benefits and alternatives for the proposed anesthesia with the patient or authorized representative who has indicated his/her understanding and acceptance.     Plan Discussed with:   Anesthesia Plan Comments:         Anesthesia Quick Evaluation

## 2017-08-01 NOTE — Op Note (Signed)
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  The right upper chest and neck area were prepped and draped sterile field and timeout performed.  Ultrasound probe was brought up in the subclavian vein was identified beneath the lateral end of the clavicle.  Local anesthetic of 0.5% Marcaine mixed with blood pressure Xylocaine was instilled in the 1 cm incision made.  With the ultrasound guidance the needle was positioned in the subclavian vein with free withdrawal of blood.  A guidewire was in position and the Seldinger technique was then used to place the catheter going into the distal SVC.  Fluoroscopy was used to negotiate the catheter in proper place.  Skin entrance site with approximately 20 cm.  Local anesthetic was then instilled over the second interspace and a transverse skin incision was made approximately 2-1/2 cm.  Subcutaneous pocket was created with cautery.  The catheter was tunneled through to the site and cut to approximate length.  Catheter was then connected to a prefilled port which was placed in the pocket and anchored with 3 stitches of 2-0 Prolene.  Port was flushed with 10 mL of heparinized saline.  Repeat fluoroscopy showed proper positioning of both port and the course of the catheter and the tip in the distal SVC area.  Subcutaneous tissue closed with 3-0 Vicryl.  Skin closed with subcuticular 4-0 Vicryl.  Dermabond was applied.  No immediate problems encountered during the procedure.  Patient returned to recovery room in stable condition

## 2017-08-01 NOTE — Transfer of Care (Signed)
Immediate Anesthesia Transfer of Care Note  Patient: Sophia Nelson  Procedure(s) Performed: INSERTION PORT-A-CATH (Right Chest)  Patient Location: PACU  Anesthesia Type:MAC  Level of Consciousness: awake, alert  and patient cooperative  Airway & Oxygen Therapy: Patient Spontanous Breathing and Patient connected to nasal cannula oxygen  Post-op Assessment: Report given to RN and Post -op Vital signs reviewed and stable  Post vital signs: Reviewed and stable  Last Vitals:  Vitals:   08/01/17 0859 08/01/17 1046  BP: 123/68 115/69  Pulse: 79 76  Resp: 18 15  Temp: 36.8 C (!) 36.3 C  SpO2: 100% 100%    Last Pain:  Vitals:   08/01/17 0859  TempSrc: Oral  PainSc: 0-No pain         Complications: No apparent anesthesia complications

## 2017-08-01 NOTE — Anesthesia Post-op Follow-up Note (Signed)
Anesthesia QCDR form completed.        

## 2017-08-01 NOTE — Interval H&P Note (Signed)
History and Physical Interval Note:  08/01/2017 9:22 AM  Sophia Nelson  has presented today for surgery, with the diagnosis of cancer left breast  The various methods of treatment have been discussed with the patient and family. After consideration of risks, benefits and other options for treatment, the patient has consented to  Procedure(s): INSERTION PORT-A-CATH (N/A) as a surgical intervention .  The patient's history has been reviewed, patient examined, no change in status, stable for surgery.  I have reviewed the patient's chart and labs.  Questions were answered to the patient's satisfaction.     SANKAR,SEEPLAPUTHUR G

## 2017-08-01 NOTE — Anesthesia Postprocedure Evaluation (Signed)
Anesthesia Post Note  Patient: Sophia Nelson  Procedure(s) Performed: INSERTION PORT-A-CATH (Right Chest)  Patient location during evaluation: PACU Anesthesia Type: General Level of consciousness: awake and alert Pain management: pain level controlled Vital Signs Assessment: post-procedure vital signs reviewed and stable Respiratory status: spontaneous breathing and respiratory function stable Cardiovascular status: stable Anesthetic complications: no     Last Vitals:  Vitals:   08/01/17 0859  BP: 123/68  Pulse: 79  Resp: 18  Temp: 36.8 C  SpO2: 100%    Last Pain:  Vitals:   08/01/17 0859  TempSrc: Oral  PainSc: 0-No pain                 Pinchus Weckwerth K

## 2017-08-02 ENCOUNTER — Inpatient Hospital Stay: Payer: Medicare HMO

## 2017-08-02 ENCOUNTER — Other Ambulatory Visit: Payer: Self-pay

## 2017-08-02 ENCOUNTER — Inpatient Hospital Stay (HOSPITAL_BASED_OUTPATIENT_CLINIC_OR_DEPARTMENT_OTHER): Payer: Medicare HMO | Admitting: Internal Medicine

## 2017-08-02 ENCOUNTER — Encounter: Payer: Self-pay | Admitting: General Surgery

## 2017-08-02 VITALS — BP 107/71 | HR 87 | Temp 97.5°F | Resp 18

## 2017-08-02 VITALS — BP 103/71 | HR 93 | Temp 97.4°F | Resp 16 | Ht 62.0 in | Wt 116.4 lb

## 2017-08-02 DIAGNOSIS — Z87891 Personal history of nicotine dependence: Secondary | ICD-10-CM | POA: Diagnosis not present

## 2017-08-02 DIAGNOSIS — D649 Anemia, unspecified: Secondary | ICD-10-CM

## 2017-08-02 DIAGNOSIS — Z853 Personal history of malignant neoplasm of breast: Secondary | ICD-10-CM | POA: Diagnosis not present

## 2017-08-02 DIAGNOSIS — Z90710 Acquired absence of both cervix and uterus: Secondary | ICD-10-CM

## 2017-08-02 DIAGNOSIS — I700 Atherosclerosis of aorta: Secondary | ICD-10-CM

## 2017-08-02 DIAGNOSIS — R112 Nausea with vomiting, unspecified: Secondary | ICD-10-CM

## 2017-08-02 DIAGNOSIS — Z170 Estrogen receptor positive status [ER+]: Secondary | ICD-10-CM

## 2017-08-02 DIAGNOSIS — R634 Abnormal weight loss: Secondary | ICD-10-CM

## 2017-08-02 DIAGNOSIS — Z7189 Other specified counseling: Secondary | ICD-10-CM

## 2017-08-02 DIAGNOSIS — C7951 Secondary malignant neoplasm of bone: Secondary | ICD-10-CM

## 2017-08-02 DIAGNOSIS — M1990 Unspecified osteoarthritis, unspecified site: Secondary | ICD-10-CM | POA: Diagnosis not present

## 2017-08-02 DIAGNOSIS — E119 Type 2 diabetes mellitus without complications: Secondary | ICD-10-CM

## 2017-08-02 DIAGNOSIS — Z5111 Encounter for antineoplastic chemotherapy: Secondary | ICD-10-CM | POA: Diagnosis not present

## 2017-08-02 DIAGNOSIS — I10 Essential (primary) hypertension: Secondary | ICD-10-CM

## 2017-08-02 DIAGNOSIS — C772 Secondary and unspecified malignant neoplasm of intra-abdominal lymph nodes: Secondary | ICD-10-CM

## 2017-08-02 DIAGNOSIS — C50812 Malignant neoplasm of overlapping sites of left female breast: Secondary | ICD-10-CM

## 2017-08-02 DIAGNOSIS — R109 Unspecified abdominal pain: Secondary | ICD-10-CM

## 2017-08-02 DIAGNOSIS — Z79818 Long term (current) use of other agents affecting estrogen receptors and estrogen levels: Secondary | ICD-10-CM | POA: Diagnosis not present

## 2017-08-02 DIAGNOSIS — G893 Neoplasm related pain (acute) (chronic): Secondary | ICD-10-CM | POA: Diagnosis not present

## 2017-08-02 DIAGNOSIS — J449 Chronic obstructive pulmonary disease, unspecified: Secondary | ICD-10-CM

## 2017-08-02 DIAGNOSIS — Z9013 Acquired absence of bilateral breasts and nipples: Secondary | ICD-10-CM

## 2017-08-02 DIAGNOSIS — K219 Gastro-esophageal reflux disease without esophagitis: Secondary | ICD-10-CM

## 2017-08-02 DIAGNOSIS — Z79899 Other long term (current) drug therapy: Secondary | ICD-10-CM | POA: Diagnosis not present

## 2017-08-02 DIAGNOSIS — R630 Anorexia: Secondary | ICD-10-CM

## 2017-08-02 DIAGNOSIS — Z7982 Long term (current) use of aspirin: Secondary | ICD-10-CM | POA: Diagnosis not present

## 2017-08-02 LAB — COMPREHENSIVE METABOLIC PANEL
ALBUMIN: 2.3 g/dL — AB (ref 3.5–5.0)
ALT: 8 U/L — ABNORMAL LOW (ref 14–54)
ANION GAP: 8 (ref 5–15)
AST: 28 U/L (ref 15–41)
Alkaline Phosphatase: 71 U/L (ref 38–126)
BILIRUBIN TOTAL: 0.5 mg/dL (ref 0.3–1.2)
BUN: 14 mg/dL (ref 6–20)
CO2: 24 mmol/L (ref 22–32)
Calcium: 8.1 mg/dL — ABNORMAL LOW (ref 8.9–10.3)
Chloride: 106 mmol/L (ref 101–111)
Creatinine, Ser: 0.59 mg/dL (ref 0.44–1.00)
GFR calc Af Amer: >60 mL/min (ref >60)
GLUCOSE: 174 mg/dL — AB (ref 65–99)
POTASSIUM: 3.3 mmol/L — AB (ref 3.5–5.1)
Sodium: 138 mmol/L (ref 135–145)
TOTAL PROTEIN: 5.4 g/dL — AB (ref 6.5–8.1)

## 2017-08-02 LAB — CBC WITH DIFFERENTIAL/PLATELET
BASOS PCT: 1 %
Basophils Absolute: 0.1 10*3/uL (ref 0–0.1)
EOS ABS: 0.1 10*3/uL (ref 0–0.7)
Eosinophils Relative: 1 %
HEMATOCRIT: 24.6 % — AB (ref 35.0–47.0)
Hemoglobin: 8.3 g/dL — ABNORMAL LOW (ref 12.0–16.0)
Lymphocytes Relative: 16 %
Lymphs Abs: 1 10*3/uL (ref 1.0–3.6)
MCH: 29.7 pg (ref 26.0–34.0)
MCHC: 33.6 g/dL (ref 32.0–36.0)
MCV: 88.6 fL (ref 80.0–100.0)
MONO ABS: 0.5 10*3/uL (ref 0.2–0.9)
MONOS PCT: 7 %
NEUTROS ABS: 5.1 10*3/uL (ref 1.4–6.5)
Neutrophils Relative %: 75 %
Platelets: 433 10*3/uL (ref 150–440)
RBC: 2.78 MIL/uL — ABNORMAL LOW (ref 3.80–5.20)
RDW: 18.9 % — AB (ref 11.5–14.5)
WBC: 6.7 10*3/uL (ref 3.6–11.0)

## 2017-08-02 MED ORDER — FAMOTIDINE IN NACL 20-0.9 MG/50ML-% IV SOLN
20.0000 mg | Freq: Once | INTRAVENOUS | Status: AC
  Administered 2017-08-02: 20 mg via INTRAVENOUS
  Filled 2017-08-02: qty 50

## 2017-08-02 MED ORDER — PACLITAXEL CHEMO INJECTION 300 MG/50ML
80.0000 mg/m2 | Freq: Once | INTRAVENOUS | Status: AC
  Administered 2017-08-02: 120 mg via INTRAVENOUS
  Filled 2017-08-02: qty 20

## 2017-08-02 MED ORDER — HEPARIN SOD (PORK) LOCK FLUSH 100 UNIT/ML IV SOLN
500.0000 [IU] | Freq: Once | INTRAVENOUS | Status: AC
  Administered 2017-08-02: 500 [IU] via INTRAVENOUS
  Filled 2017-08-02: qty 5

## 2017-08-02 MED ORDER — DIPHENHYDRAMINE HCL 50 MG/ML IJ SOLN
50.0000 mg | Freq: Once | INTRAMUSCULAR | Status: AC
  Administered 2017-08-02: 50 mg via INTRAVENOUS
  Filled 2017-08-02: qty 1

## 2017-08-02 MED ORDER — DEXAMETHASONE SODIUM PHOSPHATE 10 MG/ML IJ SOLN
10.0000 mg | Freq: Once | INTRAMUSCULAR | Status: AC
  Administered 2017-08-02: 10 mg via INTRAVENOUS
  Filled 2017-08-02: qty 1

## 2017-08-02 MED ORDER — SODIUM CHLORIDE 0.9 % IV SOLN
Freq: Once | INTRAVENOUS | Status: AC
  Administered 2017-08-02: 11:00:00 via INTRAVENOUS
  Filled 2017-08-02: qty 1000

## 2017-08-02 MED ORDER — SODIUM CHLORIDE 0.9 % IV SOLN: 10.0000 mg | Freq: Once | INTRAVENOUS | Status: DC

## 2017-08-02 MED ORDER — SODIUM CHLORIDE 0.9% FLUSH
10.0000 mL | Freq: Once | INTRAVENOUS | Status: AC
  Administered 2017-08-02: 10 mL via INTRAVENOUS
  Filled 2017-08-02: qty 10

## 2017-08-02 NOTE — Progress Notes (Signed)
Patient here for first chemo treatment.

## 2017-08-02 NOTE — Assessment & Plan Note (Addendum)
#  Metastatic breast cancer/lobular ER/PR positive HER-2/neu negative-July-AUG 2018- progression in the bone L3; also abdomen causing extrinsic compression of the small bowel [status post resection-C discussion below].  Clinical progression- noted on faslodex; CT scan abdomen pelvis does not show any obvious disease [difficult to evaluate; discussed with Dr.Davis-who also feels patient has large burden of not easily evident on the CT scan].   #Proceed with Taxol weekly chemotherapy; Discussed the potential side effects including but not limited to-increasing fatigue, nausea vomiting, diarrhea, hair loss, sores in the mouth, increase risk of infection and also neuropathy.   # Profound weight loss weight loss- Increasing nausea/vomitting-likely secondary to progression intra-abdominal.  Not clearly evident on the CT scan.  Discussed at length with the patient's son/ patient; they understand treatments of palliative not curative.    # Lytic lesion at L3- positive on PET scan. Lumbar spine shows L3 lesion- on RT [last treatment on 8/27]; no clinical evidence of progression.  We will need follow-up imaging.  # Bone metastases-s/p X-geva; HOLD today because of severe hypocalcemia/multiple electrolyte abnormalities.  # Neck pain/bone pain from malignancy- continue oxycodone.   # Hypocalcemia- Ca- 8.0; HOLD x-geva; continue vit D.   #  Anemia-multifactorial hemoglobin - improving Hb  8.3/worsening. Monitor for now. Check Iron studies.   # Poor IV access- s/p port.  # weekly cbc/Taxol; in 2 weeks/MD/labs-taxol.   # I reviewed the blood work- with the patient/son in detail; also reviewed the imaging independently [as summarized above]; and with the patient/son in detail.

## 2017-08-02 NOTE — Progress Notes (Signed)
[facility] OFFICE PROGRESS NOTE  Patient Care Team: Donnie Coffin, MD as PCP - General (Family Medicine) Donnie Coffin, MD as Referring Physician (Family Medicine) Christene Lye, MD (General Surgery) Forest Gleason, MD (Oncology) [name], MD as Consulting Physician (Internal Medicine)  Cancer of left breast [facility])   Staging form: Breast, AJCC 7th Edition     Clinical: Stage IIIB (T4, N1, cM0(i+)) - Signed by Forest Gleason, MD on 08/09/2015     Pathologic: Stage IV (T4, N1, M1) - Signed by Forest Gleason, MD on 08/16/2015    Oncology History   # DEC 2016-  LEFT BREAST CA- LOBULAR CA ER/PR-Pos; her 2 NEG STAGE IV [[id number]- left breast/bil Ax LN/Media LN/RP LN; skeletal mets]; DEC 2016- LETROZOLE + IBRANCE; April 2017- Significant response to treatment- [Dr.Sankar]; s/p Left mastec & ALND [palliative]; cont Ibrance + Letrzole. [facility] 2018/sec to Anemia]  # July 2018- PROGRESSION [PET scan-L1 lytic lesion; Small bowel extrinsic compression s/p lap resection + Lobular cancer; Dr.Davis ]  # AUG 9th th-FASLODEX; SEP 1 week- Verzinio 150 BID;STOP clinical progression [DEC 2018 CT- no obvious mass noted]  # Dec 7th Taxol weekly;  # AUG 2018- s/p RT to L3 lytic lesion  # RIGHT BREAST CA [s/p mastectomy [facility]]  -------------------------------------------------    1.) Retrocolic retrogastric Roux-en-Y (gastrojejunostomy) bypass of distal duodenal obstruction (cpt: 43820) 2.) Segmental jejunal small bowel resection for accessible tumor pathology (cpt: 97353)  INTRAOPERATIVE FINDINGS: Large firm irregular mass encasing duodenum at approximately the ligament of Treitz with diffuse tumor implant studding along much of small intestinal omentum and serosa  # [facility]- ordered 12th Dec       Carcinoma of overlapping sites of left breast in female, estrogen receptor positive ([place])    INTERVAL HISTORY:  NUVIA HILEMAN 71 y.o.  female  pleasant patient above history of Metastatic breast cancer lobular carcinoma wth recurrence in the abdomen status post resection-Patient is currently on Faslodex [August 2018]; and Verenzio [sep 2018] is here for follow-up-reviewed the results of the CT scan proceed with chemotherapy.  Patient interim-underwent port placement yesterday.  She continues to have intermittent nausea with vomiting.  Intermittent abdominal pain.  Poor appetite.  Weight loss.  Denies any worsening shortness of breath with exertion and cough. Denies any fevers or chills.  Denies any constipation or diarrhea.  REVIEW OF SYSTEMS:  A complete 10 point review of system is done which is negative except mentioned above/history of present illness.   PAST MEDICAL HISTORY :  Past Medical History:  Diagnosis Date  . Abdominal distention   . AKI (acute kidney injury) ([place]) 02/24/2017  . Anemia   . Arthritis   . Asthma   . Back pain, chronic 03/15/2017  . Breast cancer ([place]) 2009   RT MASTECTOMY, DCIS  . Cancer of intra-abdominal ([place])    Metastatic breast cancer lobular carcinoma wth recurrence in the abdomen status post resection  . Cancer of left breast ([place]) 08/09/2015   T4 N1 M1 tumor, INVASIVE LOBULAR CARCINOMA.  . Carcinoma of overlapping sites of left breast in female, estrogen receptor positive ([place]) 03/22/2016  . COPD (chronic obstructive pulmonary disease) ([place])   . Diabetes ([place]) 02/24/2017  . Diabetes mellitus without complication ([place])   . Duodenal obstruction   . Encounter for nasogastric (NG) tube placement   . Gastric outlet obstruction 02/24/2017  . GERD (gastroesophageal reflux disease)   . Headache    h/o migraines as a  child  . Hypertension   . Neck pain 04/19/2016  . Pancreatitis, acute 03/04/2017  . Pneumonia 2015  . Recurrent low back pain 03/15/2017  . Shortness of breath dyspnea    with exertion    PAST SURGICAL HISTORY :   Past Surgical History:  Procedure Laterality Date  . ABDOMINAL  HYSTERECTOMY    . BREAST SURGERY Right 2015   mastectomy  . ESOPHAGOGASTRODUODENOSCOPY N/A 02/25/2017   Procedure: ESOPHAGOGASTRODUODENOSCOPY (EGD);  Surgeon: Jonathon Bellows, MD;  Location: [facility] ENDOSCOPY;  Service: Endoscopy;  Laterality: N/A;  . ESOPHAGOGASTRODUODENOSCOPY (EGD) WITH PROPOFOL N/A 03/06/2017   Procedure: ESOPHAGOGASTRODUODENOSCOPY (EGD) WITH PROPOFOL;  Surgeon: [name], MD;  Location: [facility] ENDOSCOPY;  Service: Endoscopy;  Laterality: N/A;  . GASTROJEJUNOSTOMY N/A 03/08/2017   Procedure: Roux-en-Y gastrojejunostomy Bypass of Gastric Outlet Obstruction, small bowel resection;  Surgeon: Vickie Epley, MD;  Location: [facility] ORS;  Service: General;  Laterality: N/A;  . PORTACATH PLACEMENT Right 08/01/2017   Procedure: INSERTION PORT-A-CATH;  Surgeon: Christene Lye, MD;  Location: [facility] ORS;  Service: General;  Laterality: Right;  . SIMPLE MASTECTOMY WITH AXILLARY SENTINEL NODE BIOPSY Left 02/29/2016   Procedure: SIMPLE MASTECTOMY;  Surgeon: Christene Lye, MD;  Location: [facility] ORS;  Service: General;  Laterality: Left;  . TUBAL LIGATION      FAMILY HISTORY :   Family History  Problem Relation Age of Onset  . Lung cancer Father   . Cancer Maternal Aunt   . Breast cancer Neg Hx     SOCIAL HISTORY:   Social History   Tobacco Use  . Smoking status: Former Smoker    Packs/day: 0.25    Years: 15.00    Pack years: 3.75    Types: Cigarettes    Last attempt to quit: 07/23/2017    Years since quitting: 0.0  . Smokeless tobacco: Never Used  . Tobacco comment: currently as of 02-21-16 pt states she is only smoking 2-3 cigarettes per day  Substance Use Topics  . 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 Drug use: No    ALLERGIES:  is allergic to other.  MEDICATIONS:  Current Outpatient Medications  Medication Sig Dispense Refill  .  acetaminophen (TYLENOL) 325 MG tablet Take 1 tablet (325 mg total) by mouth every 6 (six) hours as needed for mild pain (or Fever >/= 101).    . ADVAIR DISKUS 500-50 MCG/DOSE AEPB Inhale 1 puff into the lungs 2 (two) times daily.     [name] albuterol (PROVENTIL HFA;VENTOLIN HFA) 108 (90 Base) MCG/ACT inhaler Inhale 2 puffs into the lungs every 6 (six) hours as needed for wheezing or shortness of breath.    [name] aspirin 81 MG tablet Take 81 mg by mouth daily.    [name] atorvastatin (LIPITOR) 10 MG tablet Take 10 mg by mouth daily.    [name] docusate sodium (COLACE) 100 MG capsule Take 100 mg by mouth 2 (two) times daily.    . ergocalciferol (VITAMIN D2) 50000 units capsule Take 1 capsule (50,000 Units total) by mouth once a week. (Patient taking differently: Take 50,000 Units by mouth every Monday. ) 12 capsule 1  . feeding supplement, ENSURE ENLIVE, (ENSURE ENLIVE) LIQD Take 237 mLs by mouth 3 (three) times daily between meals. 237 mL 12  . fluticasone (FLONASE) 50 MCG/ACT nasal spray Place 2 sprays into both nostrils  daily.    . guaiFENesin (MUCINEX) 600 MG 12 hr tablet Take 1 tablet (600 mg total) by mouth 2 (two) times daily as needed for cough or to loosen phlegm.    . lidocaine-prilocaine (EMLA) cream Apply 1 application topically as needed (for port access).     [name] loperamide (IMODIUM) 2 MG capsule One pill after each loose stool; maximum upto 8 pills a day. (Patient taking differently: Take 2 mg by mouth as needed for diarrhea or loose stools (Up to 8 pills (16 mg) a day). ) 40 capsule 0  . loratadine (CLARITIN) 10 MG tablet Take 10 mg by mouth daily.    . Magnesium Cl-Calcium Carbonate (SLOW MAGNESIUM/CALCIUM) 70-117 MG TBEC Take 1 tablet by mouth 2 (two) times daily. 60 tablet 6  . magnesium hydroxide (MILK OF MAGNESIA) 400 MG/5ML suspension Take 30-45 mLs by mouth daily as needed for mild constipation.    . metoCLOPramide (REGLAN) 10 MG tablet Take 1 tablet (10 mg total) by mouth 4 (four) times daily. 120  tablet 3  . montelukast (SINGULAIR) 10 MG tablet Take 10 mg by mouth at bedtime.    [name] omeprazole (PRILOSEC) 20 MG capsule Take 20 mg by mouth every morning.     . ondansetron (ZOFRAN ODT) 8 MG disintegrating tablet Take 1 tablet (8 mg total) by mouth every 8 (eight) hours as needed for nausea or vomiting. 20 tablet 3  . oxyCODONE-acetaminophen (PERCOCET/ROXICET) 5-325 MG tablet Take 1 tablet by mouth every 8 (eight) hours as needed for severe pain. 90 tablet 0  . OXYGEN Inhale 2 L into the lungs continuous.     . prochlorperazine (COMPAZINE) 10 MG tablet Take 1 tablet (10 mg total) by mouth every 6 (six) hours as needed for nausea or vomiting. 30 tablet 3  . senna-docusate (SENOKOT-S) 8.6-50 MG tablet Take 1 tablet by mouth at bedtime as needed for mild constipation.    [name] SPIRIVA HANDIHALER 18 MCG inhalation capsule Place 18 mcg into inhaler and inhale every morning.      No current facility-administered medications for this visit.    Facility-Administered Medications Ordered in Other Visits  Medication Dose Route Frequency Provider Last Rate Last Dose  . 0.9 %  sodium chloride infusion   Intravenous Once Charlaine Dalton R, MD      . heparin lock flush 100 unit/mL  500 Units Intravenous Once [name], MD        PHYSICAL EXAMINATION: ECOG PERFORMANCE STATUS: 0 - Asymptomatic  BP 103/71 (BP Location: Right Arm, Patient Position: Sitting)   Pulse 93   Temp (!) 97.4 F (36.3 C) (Tympanic)   Resp 16   Ht 5' 2"  (1.575 m)   Wt 116 lb 6.4 oz (52.8 kg)   BMI 21.29 kg/m   Filed Weights   08/02/17 0920  Weight: 116 lb 6.4 oz (52.8 kg)    GENERAL: Moderately nourished well-developed; Alert, no distress and comfortable. She is accompanied by her son. She is in a wheelchair. EYES: no pallor or icterus OROPHARYNX: no thrush or ulceration; poor dentition.   NECK: supple, no masses felt LYMPH:  no palpable lymphadenopathy in the cervical, axillary or inguinal regions LUNGS:  clear to auscultation and  No wheeze or crackles HEART/CVS: regular rate & rhythm and no murmurs; No lower extremity edema ABDOMEN:abdomen soft, non-tender and normal bowel sounds Musculoskeletal:no cyanosis of digits and no clubbing;  PSYCH: alert & oriented x 3 with fluent speech NEURO: no focal motor/sensory deficits SKIN:  no rashes or significant lesions   LABORATORY DATA:  I have reviewed the data as listed    Component Value Date/Time   NA 138 08/02/2017 0900   NA 133 (L) 04/08/2014 1738   K 3.3 (L) 08/02/2017 0900   K 4.2 04/08/2014 1738   CL 106 08/02/2017 0900   CL 99 04/08/2014 1738   CO2 24 08/02/2017 0900   CO2 28 04/08/2014 1738   GLUCOSE 174 (H) 08/02/2017 0900   GLUCOSE 116 (H) 04/08/2014 1738   BUN 14 08/02/2017 0900   BUN 5 (L) 04/08/2014 1738   CREATININE 0.59 08/02/2017 0900   CREATININE 0.42 (L) 04/08/2014 1738   CALCIUM 8.1 (L) 08/02/2017 0900   CALCIUM 4.9 06/09/2017 1441   PROT 5.4 (L) 08/02/2017 0900   ALBUMIN 2.3 (L) 08/02/2017 0900   AST 28 08/02/2017 0900   ALT 8 (L) 08/02/2017 0900   ALKPHOS 71 08/02/2017 0900   BILITOT 0.5 08/02/2017 0900   GFRNONAA >60 08/02/2017 0900   GFRNONAA >60 04/08/2014 1738   GFRAA >60 08/02/2017 0900   GFRAA >60 04/08/2014 1738    No results found for: SPEP, UPEP  Lab Results  Component Value Date   WBC 6.7 08/02/2017   NEUTROABS 5.1 08/02/2017   HGB 8.3 (L) 08/02/2017   HCT 24.6 (L) 08/02/2017   MCV 88.6 08/02/2017   PLT 433 08/02/2017      Chemistry      Component Value Date/Time   NA 138 08/02/2017 0900   NA 133 (L) 04/08/2014 1738   K 3.3 (L) 08/02/2017 0900   K 4.2 04/08/2014 1738   CL 106 08/02/2017 0900   CL 99 04/08/2014 1738   CO2 24 08/02/2017 0900   CO2 28 04/08/2014 1738   BUN 14 08/02/2017 0900   BUN 5 (L) 04/08/2014 1738   CREATININE 0.59 08/02/2017 0900   CREATININE 0.42 (L) 04/08/2014 1738      Component Value Date/Time   CALCIUM 8.1 (L) 08/02/2017 0900   CALCIUM 4.9  06/09/2017 1441   ALKPHOS 71 08/02/2017 0900   AST 28 08/02/2017 0900   ALT 8 (L) 08/02/2017 0900   BILITOT 0.5 08/02/2017 0900       RADIOGRAPHIC STUDIES: I have personally reviewed the radiological images as listed and agreed with the findings in the report. Dg Chest Port 1 View  Result Date: 08/01/2017 CLINICAL DATA:  Port-A-Cath placement EXAM: PORTABLE CHEST 1 VIEW COMPARISON:  June 11, 2017 FINDINGS: Port-A-Cath tip is at the cavoatrial junction.  No pneumothorax. There is no edema or consolidation. Heart size and pulmonary vascularity are normal. No adenopathy. There is aortic atherosclerosis. There is degenerative change in the thoracic spine. IMPRESSION: Port-A-Cath tip at cavoatrial junction. No pneumothorax. No edema or consolidation. Heart size within normal limits. There is aortic atherosclerosis. Aortic Atherosclerosis (ICD10-I70.0). Electronically Signed   By: Lowella Grip III M.D.   On: 08/01/2017 11:35   Dg C-arm 1-60 Min-no Report  Result Date: 08/01/2017 Fluoroscopy was utilized by the requesting physician.  No radiographic interpretation.     ASSESSMENT & PLAN:  Carcinoma of overlapping sites of left breast in female, estrogen receptor positive ([place]) # Metastatic breast cancer/lobular ER/PR positive HER-2/neu negative-July-AUG 2018- progression in the bone L3; also abdomen causing extrinsic compression of the small bowel [status post resection-C discussion below].  Clinical progression- noted on faslodex; CT scan abdomen pelvis does not show any obvious disease [difficult to evaluate; discussed with Dr.Davis-who also feels patient has large burden of  not easily evident on the CT scan].   #Proceed with Taxol weekly chemotherapy; Discussed the potential side effects including but not limited to-increasing fatigue, nausea vomiting, diarrhea, hair loss, sores in the mouth, increase risk of infection and also neuropathy.   # Profound weight loss weight loss-  Increasing nausea/vomitting-likely secondary to progression intra-abdominal.  Not clearly evident on the CT scan.  Discussed at length with the patient's son/ patient; they understand treatments of palliative not curative.    # Lytic lesion at L3- positive on PET scan. Lumbar spine shows L3 lesion- on RT [last treatment on 8/27]; no clinical evidence of progression.  We will need follow-up imaging.  # Bone metastases-s/p X-geva; HOLD today because of severe hypocalcemia/multiple electrolyte abnormalities.  # Neck pain/bone pain from malignancy- continue oxycodone.   # Hypocalcemia- Ca- 8.0; HOLD x-geva; continue vit D.   #  Anemia-multifactorial hemoglobin - improving Hb  8.3/worsening. Monitor for now. Check Iron studies.   # Poor IV access- s/p port.  # weekly cbc/Taxol; in 2 weeks/MD/labs-taxol.   # I reviewed the blood work- with the patient/son in detail; also reviewed the imaging independently [as summarized above]; and with the patient/son in detail.    Orders Placed This Encounter  Procedures  . CBC with Differential    Standing Status:   Future    Standing Expiration Date:   08/02/2018  . Basic metabolic panel    Standing Status:   Future    Standing Expiration Date:   08/02/2018  . CBC with Differential    Standing Status:   Future    Standing Expiration Date:   08/02/2018  . Comprehensive metabolic panel    Standing Status:   Future    Standing Expiration Date:   08/02/2018  . Hold Tube- Blood Bank    Standing Status:   Future    Standing Expiration Date:   08/02/2018  . Hold Tube- Blood Bank    Standing Status:   Future    Standing Expiration Date:   08/02/2018       [name], MD 08/02/2017 1:02 PM

## 2017-08-07 ENCOUNTER — Emergency Department: Payer: Medicare HMO

## 2017-08-07 ENCOUNTER — Inpatient Hospital Stay
Admission: EM | Admit: 2017-08-07 | Discharge: 2017-08-09 | DRG: 446 | Disposition: A | Discharge status: Home / Self Care | Payer: Medicare HMO | Attending: Internal Medicine | Admitting: Internal Medicine

## 2017-08-07 ENCOUNTER — Other Ambulatory Visit: Payer: Self-pay

## 2017-08-07 ENCOUNTER — Encounter: Payer: Self-pay | Admitting: Emergency Medicine

## 2017-08-07 DIAGNOSIS — Z9109 Other allergy status, other than to drugs and biological substances: Secondary | ICD-10-CM | POA: Diagnosis not present

## 2017-08-07 DIAGNOSIS — J449 Chronic obstructive pulmonary disease, unspecified: Secondary | ICD-10-CM | POA: Diagnosis present

## 2017-08-07 DIAGNOSIS — Z170 Estrogen receptor positive status [ER+]: Secondary | ICD-10-CM

## 2017-08-07 DIAGNOSIS — E876 Hypokalemia: Secondary | ICD-10-CM | POA: Diagnosis present

## 2017-08-07 DIAGNOSIS — Z7951 Long term (current) use of inhaled steroids: Secondary | ICD-10-CM

## 2017-08-07 DIAGNOSIS — K819 Cholecystitis, unspecified: Secondary | ICD-10-CM | POA: Diagnosis present

## 2017-08-07 DIAGNOSIS — Z91018 Allergy to other foods: Secondary | ICD-10-CM | POA: Diagnosis not present

## 2017-08-07 DIAGNOSIS — K567 Ileus, unspecified: Secondary | ICD-10-CM

## 2017-08-07 DIAGNOSIS — Z853 Personal history of malignant neoplasm of breast: Secondary | ICD-10-CM

## 2017-08-07 DIAGNOSIS — Z9011 Acquired absence of right breast and nipple: Secondary | ICD-10-CM

## 2017-08-07 DIAGNOSIS — Z87891 Personal history of nicotine dependence: Secondary | ICD-10-CM | POA: Diagnosis not present

## 2017-08-07 DIAGNOSIS — I10 Essential (primary) hypertension: Secondary | ICD-10-CM | POA: Diagnosis present

## 2017-08-07 DIAGNOSIS — C50919 Malignant neoplasm of unspecified site of unspecified female breast: Secondary | ICD-10-CM | POA: Diagnosis present

## 2017-08-07 DIAGNOSIS — K219 Gastro-esophageal reflux disease without esophagitis: Secondary | ICD-10-CM | POA: Diagnosis present

## 2017-08-07 DIAGNOSIS — D649 Anemia, unspecified: Secondary | ICD-10-CM | POA: Diagnosis present

## 2017-08-07 DIAGNOSIS — E119 Type 2 diabetes mellitus without complications: Secondary | ICD-10-CM | POA: Diagnosis present

## 2017-08-07 DIAGNOSIS — Z79899 Other long term (current) drug therapy: Secondary | ICD-10-CM | POA: Diagnosis not present

## 2017-08-07 DIAGNOSIS — Z9221 Personal history of antineoplastic chemotherapy: Secondary | ICD-10-CM

## 2017-08-07 DIAGNOSIS — R1011 Right upper quadrant pain: Secondary | ICD-10-CM

## 2017-08-07 LAB — HEPATIC FUNCTION PANEL
ALBUMIN: 2.3 g/dL — AB (ref 3.5–5.0)
ALT: 20 U/L (ref 14–54)
AST: 106 U/L — AB (ref 15–41)
Alkaline Phosphatase: 209 U/L — ABNORMAL HIGH (ref 38–126)
BILIRUBIN TOTAL: 1.5 mg/dL — AB (ref 0.3–1.2)
Bilirubin, Direct: 0.8 mg/dL — ABNORMAL HIGH (ref 0.1–0.5)
Indirect Bilirubin: 0.7 mg/dL (ref 0.3–0.9)
TOTAL PROTEIN: 5.1 g/dL — AB (ref 6.5–8.1)

## 2017-08-07 LAB — TROPONIN I

## 2017-08-07 LAB — BASIC METABOLIC PANEL
ANION GAP: 8 (ref 5–15)
BUN: 8 mg/dL (ref 6–20)
CALCIUM: 7.7 mg/dL — AB (ref 8.9–10.3)
CO2: 21 mmol/L — ABNORMAL LOW (ref 22–32)
Chloride: 108 mmol/L (ref 101–111)
Creatinine, Ser: 0.37 mg/dL — ABNORMAL LOW (ref 0.44–1.00)
GLUCOSE: 101 mg/dL — AB (ref 65–99)
POTASSIUM: 3.6 mmol/L (ref 3.5–5.1)
Sodium: 137 mmol/L (ref 135–145)

## 2017-08-07 LAB — CBC
HEMATOCRIT: 26.8 % — AB (ref 35.0–47.0)
HEMOGLOBIN: 8.7 g/dL — AB (ref 12.0–16.0)
MCH: 29.2 pg (ref 26.0–34.0)
MCHC: 32.6 g/dL (ref 32.0–36.0)
MCV: 89.7 fL (ref 80.0–100.0)
Platelets: 280 10*3/uL (ref 150–440)
RBC: 2.98 MIL/uL — AB (ref 3.80–5.20)
RDW: 18.1 % — ABNORMAL HIGH (ref 11.5–14.5)
WBC: 5.2 10*3/uL (ref 3.6–11.0)

## 2017-08-07 LAB — LIPASE, BLOOD: LIPASE: 13 U/L (ref 11–51)

## 2017-08-07 MED ORDER — SODIUM CHLORIDE 0.9 % IV BOLUS (SEPSIS)
1000.0000 mL | Freq: Once | INTRAVENOUS | Status: AC
  Administered 2017-08-07: 1000 mL via INTRAVENOUS

## 2017-08-07 MED ORDER — PIPERACILLIN-TAZOBACTAM 3.375 G IVPB 30 MIN
3.3750 g | Freq: Once | INTRAVENOUS | Status: AC
  Administered 2017-08-07: 3.375 g via INTRAVENOUS
  Filled 2017-08-07: qty 50

## 2017-08-07 MED ORDER — IOPAMIDOL (ISOVUE-370) INJECTION 76%
75.0000 mL | Freq: Once | INTRAVENOUS | Status: AC | PRN
  Administered 2017-08-07: 75 mL via INTRAVENOUS

## 2017-08-07 MED ORDER — MORPHINE SULFATE (PF) 2 MG/ML IV SOLN
2.0000 mg | Freq: Once | INTRAVENOUS | Status: AC
  Administered 2017-08-07: 2 mg via INTRAVENOUS
  Filled 2017-08-07: qty 1

## 2017-08-07 NOTE — ED Triage Notes (Signed)
Pt comes into the ED via [facility] from home c/o right sided chest pain that started earlier today.  Patient is a breast cancer patient and currently undergoing chemo treatments.  Patient states she did vomit earlier today as well from the chest pain.  Denies any dizziness or shortness of breath outside the normal. Patient wears 2L chronic.  Patient appears weak at this time.

## 2017-08-07 NOTE — H&P (Signed)
[facility] [place] NAME: Sophia Nelson    MR#:  [id number]  DATE OF BIRTH:  [birth year]  DATE OF ADMISSION:  08/07/2017  PRIMARY CARE PHYSICIAN: Donnie Coffin, MD   REQUESTING/REFERRING PHYSICIAN: Joni Fears, MD  CHIEF COMPLAINT:   Chief Complaint  Patient presents with  . Chest Pain    HISTORY OF PRESENT ILLNESS:  Sophia Nelson  is a 71 y.o. female who presents with complaint of right sided lower thoracic pain, right upper quadrant pain.  Here in the ED on imaging she was found to have some pericholecystic fluid suspicious for cholecystitis.  Surgery consulted by ED, but knows the patient and due to her significant cancer burden she is not a surgical candidate.  They recommended admission to hospitalist with IV antibiotics.  Hospitalist were called for the same  PAST MEDICAL HISTORY:   Past Medical History:  Diagnosis Date  . Abdominal distention   . AKI (acute kidney injury) ([place]) 02/24/2017  . Anemia   . Arthritis   . Asthma   . Back pain, chronic 03/15/2017  . Breast cancer ([place]) 2009   RT MASTECTOMY, DCIS  . 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 Cancer of left breast ([place]) 08/09/2015   T4 N1 M1 tumor, INVASIVE LOBULAR CARCINOMA.  . Carcinoma of overlapping sites of left breast in female, estrogen receptor positive ([place]) 03/22/2016  . COPD (chronic obstructive pulmonary disease) ([place])   . Diabetes ([place]) 02/24/2017  . Diabetes mellitus without complication ([place])   . Duodenal obstruction   . Encounter for nasogastric (NG) tube placement   . Gastric outlet obstruction 02/24/2017  . GERD (gastroesophageal reflux disease)   . Headache    h/o migraines as a child  . Hypertension   . Neck pain 04/19/2016  . Pancreatitis, acute 03/04/2017  . Pneumonia 2015  . Recurrent low back pain 03/15/2017  . Shortness of breath dyspnea    with  exertion    PAST SURGICAL HISTORY:   Past Surgical History:  Procedure Laterality Date  . ABDOMINAL HYSTERECTOMY    . BREAST SURGERY Right 2015   mastectomy  . ESOPHAGOGASTRODUODENOSCOPY N/A 02/25/2017   Procedure: ESOPHAGOGASTRODUODENOSCOPY (EGD);  Surgeon: Jonathon Bellows, MD;  Location: [facility] ENDOSCOPY;  Service: Endoscopy;  Laterality: N/A;  . ESOPHAGOGASTRODUODENOSCOPY (EGD) WITH PROPOFOL N/A 03/06/2017   Procedure: ESOPHAGOGASTRODUODENOSCOPY (EGD) WITH PROPOFOL;  Surgeon: [name], MD;  Location: [facility] ENDOSCOPY;  Service: Endoscopy;  Laterality: N/A;  . GASTROJEJUNOSTOMY N/A 03/08/2017   Procedure: Roux-en-Y gastrojejunostomy Bypass of Gastric Outlet Obstruction, small bowel resection;  Surgeon: Vickie Epley, MD;  Location: [facility] ORS;  Service: General;  Laterality: N/A;  . PORTACATH PLACEMENT Right 08/01/2017   Procedure: INSERTION PORT-A-CATH;  Surgeon: Christene Lye, MD;  Location: [facility] ORS;  Service: General;  Laterality: Right;  . SIMPLE MASTECTOMY WITH AXILLARY SENTINEL NODE BIOPSY Left 02/29/2016   Procedure: SIMPLE MASTECTOMY;  Surgeon: Christene Lye, MD;  Location: [facility] ORS;  Service: General;  Laterality: Left;  . TUBAL LIGATION      SOCIAL HISTORY:   Social History   Tobacco Use  . Smoking status: Former Smoker    Packs/day: 0.25    Years: 15.00    Pack years: 3.75    Types: Cigarettes    Last attempt to quit: 07/23/2017    Years since quitting: 0.0  . Smokeless tobacco: Never Used  .  Tobacco comment: currently as of 02-21-16 pt states she is only smoking 2-3 cigarettes per day  Substance Use Topics  . 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 Lung cancer Father   . Cancer Maternal Aunt   . Breast cancer Neg Hx     DRUG ALLERGIES:   Allergies  Allergen  Reactions  . Other Other (See Comments)    Onions(that grow in the yard-pt can eat onions without problems) and dust mite Sneezing, cough, runny nose    MEDICATIONS AT HOME:   Prior to Admission medications   Medication Sig Start Date End Date Taking? Authorizing Provider  acetaminophen (TYLENOL) 325 MG tablet Take 1 tablet (325 mg total) by mouth every 6 (six) hours as needed for mild pain (or Fever >/= 101). 06/17/17   Gouru, Illene Silver, MD  ADVAIR DISKUS 500-50 MCG/DOSE AEPB Inhale 1 puff into the lungs 2 (two) times daily.  05/04/15   [provider]  albuterol (PROVENTIL HFA;VENTOLIN HFA) 108 (90 Base) MCG/ACT inhaler Inhale 2 puffs into the lungs every 6 (six) hours as needed for wheezing or shortness of breath.    [provider]  aspirin 81 MG tablet Take 81 mg by mouth daily.    [provider]  atorvastatin (LIPITOR) 10 MG tablet Take 10 mg by mouth daily.    [provider]  docusate sodium (COLACE) 100 MG capsule Take 100 mg by mouth 2 (two) times daily.    [provider]  ergocalciferol (VITAMIN D2) 50000 units capsule Take 1 capsule (50,000 Units total) by mouth once a week. Patient taking differently: Take 50,000 Units by mouth every Monday.  05/30/17   [name], MD  feeding supplement, ENSURE ENLIVE, (ENSURE ENLIVE) LIQD Take 237 mLs by mouth 3 (three) times daily between meals. 03/13/17   Fritzi Mandes, MD  fluticasone (FLONASE) 50 MCG/ACT nasal spray Place 2 sprays into both nostrils daily.    [provider]  guaiFENesin (MUCINEX) 600 MG 12 hr tablet Take 1 tablet (600 mg total) by mouth 2 (two) times daily as needed for cough or to loosen phlegm. 06/17/17   Nicholes Mango, MD  lidocaine-prilocaine (EMLA) cream Apply 1 application topically as needed (for port access).  04/26/17   [provider]  loperamide (IMODIUM) 2 MG capsule One pill after each loose stool; maximum upto 8 pills a day. Patient taking  differently: Take 2 mg by mouth as needed for diarrhea or loose stools (Up to 8 pills (16 mg) a day).  05/02/17   [name], MD  loratadine (CLARITIN) 10 MG tablet Take 10 mg by mouth daily.    [provider]  Magnesium Cl-Calcium Carbonate (SLOW MAGNESIUM/CALCIUM) 70-117 MG TBEC Take 1 tablet by mouth 2 (two) times daily. 06/28/17   [name], MD  magnesium hydroxide (MILK OF MAGNESIA) 400 MG/5ML suspension Take 30-45 mLs by mouth daily as needed for mild constipation.    [provider]  metoCLOPramide (REGLAN) 10 MG tablet Take 1 tablet (10 mg total) by mouth 4 (four) times daily. 05/30/17   [name], MD  montelukast (SINGULAIR) 10 MG tablet Take 10 mg by mouth at bedtime.    [provider]  omeprazole (PRILOSEC) 20 MG capsule  Take 20 mg by mouth every morning.  06/23/15   [provider]  ondansetron (ZOFRAN ODT) 8 MG disintegrating tablet Take 1 tablet (8 mg total) by mouth every 8 (eight) hours as needed for nausea or vomiting. 07/26/17   [name], MD  oxyCODONE-acetaminophen (PERCOCET/ROXICET) 5-325 MG tablet Take 1 tablet by mouth every 8 (eight) hours as needed for severe pain. 07/26/17   [name], MD  OXYGEN Inhale 2 L into the lungs continuous.     [provider]  prochlorperazine (COMPAZINE) 10 MG tablet Take 1 tablet (10 mg total) by mouth every 6 (six) hours as needed for nausea or vomiting. 07/26/17   [name], MD  senna-docusate (SENOKOT-S) 8.6-50 MG tablet Take 1 tablet by mouth at bedtime as needed for mild constipation. 06/17/17   Gouru, Illene Silver, MD  SPIRIVA HANDIHALER 18 MCG inhalation capsule Place 18 mcg into inhaler and inhale every morning.  05/04/15   [provider]    REVIEW OF SYSTEMS:  Review of Systems  Constitutional: Negative for chills, fever, malaise/fatigue and weight loss.  HENT: Negative for ear pain, hearing loss and tinnitus.   Eyes:  Negative for blurred vision, double vision, pain and redness.  Respiratory: Negative for cough, hemoptysis and shortness of breath.   Cardiovascular: Negative for chest pain, palpitations, orthopnea and leg swelling.  Gastrointestinal: Positive for abdominal pain. Negative for constipation, diarrhea, nausea and vomiting.  Genitourinary: Negative for dysuria, frequency and hematuria.  Musculoskeletal: Negative for back pain, joint pain and neck pain.  Skin:       No acne, rash, or lesions  Neurological: Negative for dizziness, tremors, focal weakness and weakness.  Endo/Heme/Allergies: Negative for polydipsia. Does not bruise/bleed easily.  Psychiatric/Behavioral: Negative for depression. The patient is not nervous/anxious and does not have insomnia.      VITAL SIGNS:   Vitals:   08/07/17 2000 08/07/17 2057 08/07/17 2100 08/07/17 2258  BP: 120/72 107/66 (!) 121/57 (!) 148/64  Pulse: 97 (!) 101 100 (!) 112  Resp: 12 17 18 20   Temp:      TempSrc:      SpO2: 100% 100% 100% 99%  Weight:      Height:       Wt Readings from Last 3 Encounters:  08/07/17 51.7 kg (114 lb)  08/02/17 52.8 kg (116 lb 6.4 oz)  08/01/17 51.7 kg (114 lb)    PHYSICAL EXAMINATION:  Physical Exam  Vitals reviewed. Constitutional: She is oriented to person, place, and time. She appears well-developed and well-nourished. No distress.  HENT:  Head: Normocephalic and atraumatic.  Mouth/Throat: Oropharynx is clear and moist.  Eyes: Conjunctivae and EOM are normal. Pupils are equal, round, and reactive to light. No scleral icterus.  Neck: Normal range of motion. Neck supple. No JVD present. No thyromegaly present.  Cardiovascular: Normal rate, regular rhythm and intact distal pulses. Exam reveals no gallop and no friction rub.  No murmur heard. Respiratory: Effort normal and breath sounds normal. No respiratory distress. She has no wheezes. She has no rales.  GI: Soft. Bowel sounds are normal. She exhibits no  distension. There is tenderness (Right upper quadrant).  Musculoskeletal: Normal range of motion. She exhibits no edema.  No arthritis, no gout  Lymphadenopathy:    She has no cervical adenopathy.  Neurological: She is alert and oriented to person, place, and time. No cranial nerve deficit.  No dysarthria, no aphasia  Skin: Skin is warm and dry. No rash noted. No  erythema.  Psychiatric: She has a normal mood and affect. Her behavior is normal. Judgment and thought content normal.    LABORATORY PANEL:   CBC Recent Labs  Lab 08/07/17 1647  WBC 5.2  HGB 8.7*  HCT 26.8*  PLT 280   ------------------------------------------------------------------------------------------------------------------  Chemistries  Recent Labs  Lab 08/07/17 1647  NA 137  K 3.6  CL 108  CO2 21*  GLUCOSE 101*  BUN 8  CREATININE 0.37*  CALCIUM 7.7*  AST 106*  ALT 20  ALKPHOS 209*  BILITOT 1.5*   ------------------------------------------------------------------------------------------------------------------  Cardiac Enzymes Recent Labs  Lab 08/07/17 2050  TROPONINI <0.03   ------------------------------------------------------------------------------------------------------------------  RADIOLOGY:  Dg Chest 2 View  Result Date: 08/07/2017 CLINICAL DATA:  Right-sided chest pain. History of breast cancer currently undergoing chemotherapy. EXAM: CHEST  2 VIEW COMPARISON:  08/01/2017 FINDINGS: A right subclavian Port-A-Cath terminates near the cavoatrial junction. The cardiac silhouette is normal in size. Aortic atherosclerosis is noted. The lungs are hyperinflated with mild bronchitic changes. No confluent airspace opacity, edema, pleural effusion, or pneumothorax is identified. No acute osseous abnormality is seen. IMPRESSION: COPD without evidence of active cardiopulmonary disease. Electronically Signed   By: Logan Bores M.D.   On: 08/07/2017 16:44   Ct Angio Chest Pe W And/or Wo  Contrast  Result Date: 08/07/2017 CLINICAL DATA:  Right-sided chest pain, onset earlier today. EXAM: CT ANGIOGRAPHY CHEST WITH CONTRAST TECHNIQUE: Multidetector CT imaging of the chest was performed using the standard protocol during bolus administration of intravenous contrast. Multiplanar CT image reconstructions and MIPs were obtained to evaluate the vascular anatomy. CONTRAST:  57mL ISOVUE-370 IOPAMIDOL (ISOVUE-370) INJECTION 76% COMPARISON:  Radiographs 08/07/2017, MRI 03/28/2017, PET-CT 03/07/2017, CT 02/24/2017. FINDINGS: Cardiovascular: Satisfactory opacification of the pulmonary arteries to the segmental level. No evidence of pulmonary embolism. Normal heart size. No pericardial effusion. The thoracic aorta is normal in caliber and intact. There is extensive atherosclerotic calcification of the aorta and coronary arteries. Mediastinum/Nodes: No enlarged mediastinal, hilar, or axillary lymph nodes. Thyroid gland, trachea, and esophagus demonstrate no significant findings. Lungs/Pleura: Lungs are clear except for upper lobe centrilobular emphysematous disease. No pleural effusion or pneumothorax. Upper Abdomen: Marked gastric mural thickening, new from 02/24/2017 and 03/07/2017. Prominent distension of the duodenal bulb, incompletely imaged but this was present on the prior studies. Musculoskeletal: No significant skeletal lesions. Review of the MIP images confirms the above findings. IMPRESSION: 1. Negative for acute pulmonary embolism. No acute findings are evident in the chest. 2. Marked gastric mural thickening, new. This may represent gastritis. Incompletely imaged. 3. Extensive aortic atherosclerosis. Centrilobular emphysematous disease. Electronically Signed   By: Andreas Newport M.D.   On: 08/07/2017 18:19   US Abdomen Limited Ruq  Result Date: 08/07/2017 CLINICAL DATA:  Right-sided chest pain. Elevated liver function tests. Patient is being treated with chemotherapy for breast cancer. EXAM:  ULTRASOUND ABDOMEN LIMITED RIGHT UPPER QUADRANT COMPARISON:  None. FINDINGS: Gallbladder: The gallbladder is normally distended. There is mild thickening of the gallbladder wall measuring 3.5 mm. Small amount of pericholecystic fluid is also seen. Possible tiny 3 mm calculus within the fundus of the gallbladder. Common bile duct: Diameter: Extrahepatic common bile duct measures 1 cm. Liver: No focal lesion identified. Diffusely increased parenchymal echogenicity. Portal vein is patent on color Doppler imaging with normal direction of blood flow towards the liver. IMPRESSION: Mild gallbladder wall thickening and small amount of pericholecystic fluid. These may represent inflammatory changes of the gallbladder, however Murphy's sign was reported as negative, which makes acute  cholecystitis less likely. If further imaging evaluation is desired, hepatobiliary nuclear medicine scan may be considered. Dilation of the extrahepatic common bile duct to 1 cm. In correlation with prior CT, this represents a chronic finding. Electronically Signed   By: Fidela Salisbury M.D.   On: 08/07/2017 21:04    EKG:   Orders placed or performed during the hospital encounter of 08/07/17  . ED EKG within 10 minutes  . ED EKG within 10 minutes    IMPRESSION AND PLAN:  Principal Problem:   Cholecystitis -IV Zosyn, PRN analgesia and antiemetics Active Problems:   Diabetes (HCC) -sliding scale insulin with corresponding glucose checks   COPD (chronic obstructive pulmonary disease) (HCC) -continue home meds   GERD (gastroesophageal reflux disease) -home dose PPI  All the records are reviewed and case discussed with ED provider. Management plans discussed with the patient and/or family.  DVT PROPHYLAXIS: SubQ lovenox  GI PROPHYLAXIS: PPI  ADMISSION STATUS: Inpatient  CODE STATUS: Full Code Status History    Date Active Date Inactive Code Status Order ID Comments User Context   06/09/2017 03:31 06/17/2017 17:15 Full  Code [id number]  Harvie Bridge, DO ED   03/04/2017 01:23 03/13/2017 20:56 Full Code [id number]  Harvie Bridge, DO Inpatient   02/24/2017 23:40 02/26/2017 17:06 Full Code [id number]  Lance Coon, MD Inpatient      TOTAL TIME TAKING CARE OF THIS PATIENT: 45 minutes.   Badr Piedra FIELDING 08/07/2017, 11:58 PM  [employer]  [phone]  CC: Primary care physician; Donnie Coffin, MD  Note:  This document was prepared using Dragon voice recognition software and may include unintentional dictation errors.

## 2017-08-07 NOTE — ED Notes (Signed)
Pt returned from ultrasound

## 2017-08-07 NOTE — ED Provider Notes (Signed)
[facility] Emergency Department Provider Note  ____________________________________________  Time seen: Approximately 8:25 PM  I have reviewed the triage vital signs and the nursing notes.   HISTORY  Chief Complaint Chest Pain    HPI Sophia Nelson is a 71 y.o. female who complains of right lower chest pain that started earlier today. Constant, nonradiating. Associated with one episode of vomiting. No shortness of breath. Not exertional or pleuritic. No aggravating or alleviating factors. Mild to moderate intensity, aching. Patient is currently undergoing chemotherapy for metastatic breast cancer. No fevers or chills.     Past Medical History:  Diagnosis Date  . Abdominal distention   . AKI (acute kidney injury) ([place]) 02/24/2017  . Anemia   . Arthritis   . Asthma   . Back pain, chronic 03/15/2017  . Breast cancer ([place]) 2009   RT MASTECTOMY, DCIS  . 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 Cancer of left breast ([place]) 08/09/2015   T4 N1 M1 tumor, INVASIVE LOBULAR CARCINOMA.  . Carcinoma of overlapping sites of left breast in female, estrogen receptor positive ([place]) 03/22/2016  . COPD (chronic obstructive pulmonary disease) ([place])   . Diabetes ([place]) 02/24/2017  . Diabetes mellitus without complication ([place])   . Duodenal obstruction   . Encounter for nasogastric (NG) tube placement   . Gastric outlet obstruction 02/24/2017  . GERD (gastroesophageal reflux disease)   . Headache    h/o migraines as a child  . Hypertension   . Neck pain 04/19/2016  . Pancreatitis, acute 03/04/2017  . Pneumonia 2015  . Recurrent low back pain 03/15/2017  . Shortness of breath dyspnea    with exertion     Patient Active Problem List   Diagnosis Date Noted  . Syncope   . Metastatic breast cancer ([place])   . Hypotension   . Palliative care by specialist   . Goals of care,  counseling/discussion   . Severe sepsis with septic shock ([place]) 06/09/2017  . Hypocalcemia 05/30/2017  . Metastasis to bone ([place]) 04/30/2017  . Acute back pain 03/28/2017  . Back pain, chronic 03/15/2017  . Chronic lumbar pain 03/15/2017  . Recurrent low back pain 03/15/2017  . Duodenal obstruction   . Pancreatitis, acute 03/04/2017  . Abdominal distention   . Encounter for nasogastric (NG) tube placement   . Gastric outlet obstruction 02/24/2017  . AKI (acute kidney injury) ([place]) 02/24/2017  . GERD (gastroesophageal reflux disease) 02/24/2017  . Diabetes ([place]) 02/24/2017  . COPD (chronic obstructive pulmonary disease) ([place]) 02/24/2017  . Neck pain 04/19/2016  . Carcinoma of overlapping sites of left breast in female, estrogen receptor positive ([place]) 03/22/2016     Past Surgical History:  Procedure Laterality Date  . ABDOMINAL HYSTERECTOMY    . BREAST SURGERY Right 2015   mastectomy  . ESOPHAGOGASTRODUODENOSCOPY N/A 02/25/2017   Procedure: ESOPHAGOGASTRODUODENOSCOPY (EGD);  Surgeon: Jonathon Bellows, MD;  Location: [facility] ENDOSCOPY;  Service: Endoscopy;  Laterality: N/A;  . ESOPHAGOGASTRODUODENOSCOPY (EGD) WITH PROPOFOL N/A 03/06/2017   Procedure: ESOPHAGOGASTRODUODENOSCOPY (EGD) WITH PROPOFOL;  Surgeon: [name], MD;  Location: [facility] ENDOSCOPY;  Service: Endoscopy;  Laterality: N/A;  . GASTROJEJUNOSTOMY N/A 03/08/2017   Procedure: Roux-en-Y gastrojejunostomy Bypass of Gastric Outlet Obstruction, small bowel resection;  Surgeon: Vickie Epley, MD;  Location: [facility] ORS;  Service: General;  Laterality: N/A;  . PORTACATH PLACEMENT Right 08/01/2017   Procedure: INSERTION PORT-A-CATH;  Surgeon: Christene Lye,  MD;  Location: [facility] ORS;  Service: General;  Laterality: Right;  . SIMPLE MASTECTOMY WITH AXILLARY SENTINEL NODE BIOPSY Left 02/29/2016   Procedure: SIMPLE MASTECTOMY;  Surgeon: Christene Lye, MD;  Location: [facility] ORS;  Service: General;  Laterality: Left;  . TUBAL  LIGATION       Prior to Admission medications   Medication Sig Start Date End Date Taking? Authorizing Provider  acetaminophen (TYLENOL) 325 MG tablet Take 1 tablet (325 mg total) by mouth every 6 (six) hours as needed for mild pain (or Fever >/= 101). 06/17/17   Gouru, Illene Silver, MD  ADVAIR DISKUS 500-50 MCG/DOSE AEPB Inhale 1 puff into the lungs 2 (two) times daily.  05/04/15   [provider]  albuterol (PROVENTIL HFA;VENTOLIN HFA) 108 (90 Base) MCG/ACT inhaler Inhale 2 puffs into the lungs every 6 (six) hours as needed for wheezing or shortness of breath.    [provider]  aspirin 81 MG tablet Take 81 mg by mouth daily.    [provider]  atorvastatin (LIPITOR) 10 MG tablet Take 10 mg by mouth daily.    [provider]  docusate sodium (COLACE) 100 MG capsule Take 100 mg by mouth 2 (two) times daily.    [provider]  ergocalciferol (VITAMIN D2) 50000 units capsule Take 1 capsule (50,000 Units total) by mouth once a week. Patient taking differently: Take 50,000 Units by mouth every Monday.  05/30/17   [name], MD  feeding supplement, ENSURE ENLIVE, (ENSURE ENLIVE) LIQD Take 237 mLs by mouth 3 (three) times daily between meals. 03/13/17   Fritzi Mandes, MD  fluticasone (FLONASE) 50 MCG/ACT nasal spray Place 2 sprays into both nostrils daily.    [provider]  guaiFENesin (MUCINEX) 600 MG 12 hr tablet Take 1 tablet (600 mg total) by mouth 2 (two) times daily as needed for cough or to loosen phlegm. 06/17/17   Nicholes Mango, MD  lidocaine-prilocaine (EMLA) cream Apply 1 application topically as needed (for port access).  04/26/17   [provider]  loperamide (IMODIUM) 2 MG capsule One pill after each loose stool; maximum upto 8 pills a day. Patient taking differently: Take 2 mg by mouth as needed for diarrhea or loose stools (Up to 8 pills (16 mg) a day).  05/02/17   [name], MD  loratadine (CLARITIN) 10 MG  tablet Take 10 mg by mouth daily.    [provider]  Magnesium Cl-Calcium Carbonate (SLOW MAGNESIUM/CALCIUM) 70-117 MG TBEC Take 1 tablet by mouth 2 (two) times daily. 06/28/17   [name], MD  magnesium hydroxide (MILK OF MAGNESIA) 400 MG/5ML suspension Take 30-45 mLs by mouth daily as needed for mild constipation.    [provider]  metoCLOPramide (REGLAN) 10 MG tablet Take 1 tablet (10 mg total) by mouth 4 (four) times daily. 05/30/17   [name], MD  montelukast (SINGULAIR) 10 MG tablet Take 10 mg by mouth at bedtime.    [provider]  omeprazole (PRILOSEC) 20 MG capsule Take 20 mg by mouth every morning.  06/23/15   [provider]  ondansetron (ZOFRAN ODT) 8 MG disintegrating tablet Take 1 tablet (8 mg total) by mouth every 8 (eight) hours as needed for nausea or vomiting. 07/26/17   [name], MD  oxyCODONE-acetaminophen (PERCOCET/ROXICET) 5-325 MG tablet Take 1 tablet by mouth every 8 (eight) hours as needed for severe pain. 07/26/17   [name], MD  OXYGEN Inhale 2 L into  the lungs continuous.     [provider]  prochlorperazine (COMPAZINE) 10 MG tablet Take 1 tablet (10 mg total) by mouth every 6 (six) hours as needed for nausea or vomiting. 07/26/17   [name], MD  senna-docusate (SENOKOT-S) 8.6-50 MG tablet Take 1 tablet by mouth at bedtime as needed for mild constipation. 06/17/17   Gouru, Illene Silver, MD  SPIRIVA HANDIHALER 18 MCG inhalation capsule Place 18 mcg into inhaler and inhale every morning.  05/04/15   [provider]     Allergies Other   Family History  Problem Relation Age of Onset  . Lung cancer Father   . Cancer Maternal Aunt   . Breast cancer Neg Hx     Social History Social History   Tobacco Use  . Smoking status: Former Smoker    Packs/day: 0.25    Years: 15.00    Pack years: 3.75    Types: Cigarettes    Last attempt to quit: 07/23/2017     Years since quitting: 0.0  . Smokeless tobacco: Never Used  . Tobacco comment: currently as of 02-21-16 pt states she is only smoking 2-3 cigarettes per day  Substance Use Topics  . 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 Drug use: No    Review of Systems  Constitutional:   No fever or chills.  ENT:   No sore throat. No rhinorrhea. Cardiovascular:   Positive as above chest pain without syncope. Respiratory:   No dyspnea or cough. Gastrointestinal:   Negative for abdominal pain, one episode of vomiting. No constipation or diarrhea.  Musculoskeletal:   Negative for focal pain or swelling All other systems reviewed and are negative except as documented above in ROS and HPI.  ____________________________________________   PHYSICAL EXAM:  VITAL SIGNS: ED Triage Vitals  Enc Vitals Group     BP 08/07/17 1607 (!) 151/79     Pulse Rate 08/07/17 1607 (!) 106     Resp 08/07/17 1607 16     Temp 08/07/17 1607 97.7 F (36.5 C)     Temp Source 08/07/17 1607 Oral     SpO2 08/07/17 1607 100 %     Weight 08/07/17 1608 114 lb (51.7 kg)     Height 08/07/17 1608 _0  (1.575 m)     Head Circumference --      Peak Flow --      Pain Score 08/07/17 1607 7     Pain Loc --      Pain Edu? --      Excl. in [place]? --     Vital signs reviewed, nursing assessments reviewed.   Constitutional:   Alert and oriented. Well appearing and in no distress. Eyes:   No scleral icterus.  EOMI. No nystagmus. No conjunctival pallor. PERRL. ENT   Head:   Normocephalic and atraumatic.   Nose:   No congestion/rhinnorhea.    Mouth/Throat:   MMM, no pharyngeal erythema. No peritonsillar mass.    Neck:   No meningismus. Full ROM. Hematological/Lymphatic/Immunilogical:   No cervical lymphadenopathy. Cardiovascular:   Tachycardia heart rate 105. Symmetric bilateral radial and DP pulses.   No murmurs.  Respiratory:   Normal respiratory effort without tachypnea/retractions. Breath sounds are clear and equal bilaterally. No wheezes/rales/rhonchi. Gastrointestinal:   Soft and nontender. Non distended. There is no CVA tenderness.  No  rebound, rigidity, or guarding. Genitourinary:   deferred Musculoskeletal:   Normal range of motion in all extremities. No joint effusions.  No lower extremity tenderness.  No edema. Neurologic:   Normal speech and language.  Motor grossly intact. No gross focal neurologic deficits are appreciated.  Skin:    Skin is warm, dry and intact. No rash noted.  No petechiae, purpura, or bullae.  ____________________________________________    LABS (pertinent positives/negatives) (all labs ordered are listed, but only abnormal results are displayed) Labs Reviewed  BASIC METABOLIC PANEL - Abnormal; Notable for the following components:      Result Value   CO2 21 (*)    Glucose, Bld 101 (*)    Creatinine, Ser 0.37 (*)    Calcium 7.7 (*)    All other components within normal limits  CBC - Abnormal; Notable for the following components:   RBC 2.98 (*)    Hemoglobin 8.7 (*)    HCT 26.8 (*)    RDW 18.1 (*)    All other components within normal limits  HEPATIC FUNCTION PANEL - Abnormal; Notable for the following components:   Total Protein 5.1 (*)    Albumin 2.3 (*)    AST 106 (*)    Alkaline Phosphatase 209 (*)    Total Bilirubin 1.5 (*)    Bilirubin, Direct 0.8 (*)    All other components within normal limits  TROPONIN I  LIPASE, BLOOD  TROPONIN I   ____________________________________________   EKG  Interpreted by me Sinus rhythm rate of 95, normal axis and intervals. Poor R-wave progression in anterior precordial leads. Normal ST segments and T waves. No acute ischemic changes.  ____________________________________________    RADIOLOGY  Dg Chest 2 View  Result Date: 08/07/2017 CLINICAL DATA:  Right-sided chest pain. History of  breast cancer currently undergoing chemotherapy. EXAM: CHEST  2 VIEW COMPARISON:  08/01/2017 FINDINGS: A right subclavian Port-A-Cath terminates near the cavoatrial junction. The cardiac silhouette is normal in size. Aortic atherosclerosis is noted. The lungs are hyperinflated with mild bronchitic changes. No confluent airspace opacity, edema, pleural effusion, or pneumothorax is identified. No acute osseous abnormality is seen. IMPRESSION: COPD without evidence of active cardiopulmonary disease. Electronically Signed   By: Logan Bores M.D.   On: 08/07/2017 16:44   Ct Angio Chest Pe W And/or Wo Contrast  Result Date: 08/07/2017 CLINICAL DATA:  Right-sided chest pain, onset earlier today. EXAM: CT ANGIOGRAPHY CHEST WITH CONTRAST TECHNIQUE: Multidetector CT imaging of the chest was performed using the standard protocol during bolus administration of intravenous contrast. Multiplanar CT image reconstructions and MIPs were obtained to evaluate the vascular anatomy. CONTRAST:  104m ISOVUE-370 IOPAMIDOL (ISOVUE-370) INJECTION 76% COMPARISON:  Radiographs 08/07/2017, MRI 03/28/2017, PET-CT 03/07/2017, CT 02/24/2017. FINDINGS: Cardiovascular: Satisfactory opacification of the pulmonary arteries to the segmental level. No evidence of pulmonary embolism. Normal heart size. No pericardial effusion. The thoracic aorta is normal in caliber and intact. There is extensive atherosclerotic calcification of the aorta and coronary arteries. Mediastinum/Nodes: No enlarged mediastinal, hilar, or axillary lymph nodes. Thyroid gland, trachea, and esophagus demonstrate no significant findings. Lungs/Pleura: Lungs are clear except for upper lobe centrilobular emphysematous disease. No pleural effusion or pneumothorax. Upper Abdomen: Marked gastric mural thickening, new from 02/24/2017 and 03/07/2017. Prominent distension of the duodenal bulb, incompletely imaged but this was present on the prior studies. Musculoskeletal: No  significant skeletal lesions. Review of the MIP images confirms the above findings. IMPRESSION: 1. Negative for acute pulmonary embolism. No acute findings are evident  in the chest. 2. Marked gastric mural thickening, new. This may represent gastritis. Incompletely imaged. 3. Extensive aortic atherosclerosis. Centrilobular emphysematous disease. Electronically Signed   By: Andreas Newport M.D.   On: 08/07/2017 18:19   US Abdomen Limited Ruq  Result Date: 08/07/2017 CLINICAL DATA:  Right-sided chest pain. Elevated liver function tests. Patient is being treated with chemotherapy for breast cancer. EXAM: ULTRASOUND ABDOMEN LIMITED RIGHT UPPER QUADRANT COMPARISON:  None. FINDINGS: Gallbladder: The gallbladder is normally distended. There is mild thickening of the gallbladder wall measuring 3.5 mm. Small amount of pericholecystic fluid is also seen. Possible tiny 3 mm calculus within the fundus of the gallbladder. Common bile duct: Diameter: Extrahepatic common bile duct measures 1 cm. Liver: No focal lesion identified. Diffusely increased parenchymal echogenicity. Portal vein is patent on color Doppler imaging with normal direction of blood flow towards the liver. IMPRESSION: Mild gallbladder wall thickening and small amount of pericholecystic fluid. These may represent inflammatory changes of the gallbladder, however Murphy's sign was reported as negative, which makes acute cholecystitis less likely. If further imaging evaluation is desired, hepatobiliary nuclear medicine scan may be considered. Dilation of the extrahepatic common bile duct to 1 cm. In correlation with prior CT, this represents a chronic finding. Electronically Signed   By: Fidela Salisbury M.D.   On: 08/07/2017 21:04    ____________________________________________   PROCEDURES Procedures  ____________________________________________   DIFFERENTIAL DIAGNOSIS  Non-STEMI, PE, pneumothorax, pneumonia, biliary disease, pancreatitis,  hepatitis  CLINICAL IMPRESSION / ASSESSMENT AND PLAN / ED COURSE  Pertinent labs & imaging results that were available during my care of the patient were reviewed by me and considered in my medical decision making (see chart for details).   Patient not in distress but presents with atypical chest pain. With her medical history she is at high risk for PE so a CT scan of the chest was performed. This is negative for PE or other thoracic pathology. Labs do show an elevation of LFTs which is new, predominantly alkaline phosphatase and AST with some elevation of the T bili as well. Ultrasound of the right upper quadrant is being obtained to further evaluate for this. Otherwise the patient is breathing comfortably on her baseline supplemental oxygen, not in distress. Initial tachycardia improved. If workup is negative and I think she is stable for discharge home and outpatient follow-up despite the lack of a clear diagnosis for her pain today.  Clinical Course as of Aug 07 2252  Wed Aug 07, 2017  2237 [place] suggests GB inflammation, but pt has severe met. Dz as well which could cause these findings. Will tx with zosyn, admit to medicine, f/u clinical progress. May need onc/palliative eval. D/w Dr. Rosana Hoes of surgery who is familiar with pt, no surgical role at this point.   [PS]    Clinical Course User Index [PS] Carrie Mew, MD     ----------------------------------------- 10:54 PM on 08/07/2017 -----------------------------------------  Patient requests pain medicine. I'll give him morphine 2 mg IV, follow-up response, dose as needed.  ____________________________________________   FINAL CLINICAL IMPRESSION(S) / ED DIAGNOSES    Final diagnoses:  Right upper quadrant abdominal pain  Cholecystitis      This SmartLink is deprecated. Use AVSMEDLIST instead to display the medication list for a patient.   Portions of this note were generated with dragon dictation software. Dictation  errors may occur despite best attempts at proofreading.    Carrie Mew, MD 08/07/17 [phone]

## 2017-08-07 NOTE — ED Notes (Signed)
Pt went to ultrasound.

## 2017-08-08 ENCOUNTER — Other Ambulatory Visit: Payer: Self-pay

## 2017-08-08 ENCOUNTER — Inpatient Hospital Stay: Payer: Medicare HMO

## 2017-08-08 LAB — BASIC METABOLIC PANEL
ANION GAP: 7 (ref 5–15)
BUN: 8 mg/dL (ref 6–20)
CHLORIDE: 108 mmol/L (ref 101–111)
CO2: 20 mmol/L — AB (ref 22–32)
Calcium: 7.3 mg/dL — ABNORMAL LOW (ref 8.9–10.3)
Creatinine, Ser: 0.49 mg/dL (ref 0.44–1.00)
GFR calc non Af Amer: >60 mL/min (ref >60)
Glucose, Bld: 77 mg/dL (ref 65–99)
POTASSIUM: 3.4 mmol/L — AB (ref 3.5–5.1)
SODIUM: 135 mmol/L (ref 135–145)

## 2017-08-08 LAB — CBC
HEMATOCRIT: 22.5 % — AB (ref 35.0–47.0)
HEMOGLOBIN: 7.5 g/dL — AB (ref 12.0–16.0)
MCH: 30 pg (ref 26.0–34.0)
MCHC: 33.4 g/dL (ref 32.0–36.0)
MCV: 89.9 fL (ref 80.0–100.0)
PLATELETS: 230 10*3/uL (ref 150–440)
RBC: 2.51 MIL/uL — AB (ref 3.80–5.20)
RDW: 18.2 % — ABNORMAL HIGH (ref 11.5–14.5)
WBC: 6.7 10*3/uL (ref 3.6–11.0)

## 2017-08-08 MED ORDER — OXYCODONE-ACETAMINOPHEN 5-325 MG PO TABS
1.0000 | ORAL_TABLET | Freq: Four times a day (QID) | ORAL | Status: DC | PRN
Start: 2017-08-08 — End: 2017-08-10

## 2017-08-08 MED ORDER — ACETAMINOPHEN 325 MG PO TABS: 650.0000 mg | ORAL_TABLET | Freq: Four times a day (QID) | ORAL | Status: DC | PRN

## 2017-08-08 MED ORDER — ONDANSETRON HCL 4 MG PO TABS: 4.0000 mg | ORAL_TABLET | Freq: Four times a day (QID) | ORAL | Status: DC | PRN

## 2017-08-08 MED ORDER — MOMETASONE FURO-FORMOTEROL FUM 200-5 MCG/ACT IN AERO
2.0000 | INHALATION_SPRAY | Freq: Two times a day (BID) | RESPIRATORY_TRACT | Status: DC
  Administered 2017-08-08 – 2017-08-09 (×4): 2 via RESPIRATORY_TRACT
  Filled 2017-08-08: qty 8.8

## 2017-08-08 MED ORDER — PIPERACILLIN-TAZOBACTAM 3.375 G IVPB
3.3750 g | Freq: Three times a day (TID) | INTRAVENOUS | Status: DC
  Administered 2017-08-08 – 2017-08-09 (×3): 3.375 g via INTRAVENOUS
  Filled 2017-08-08 (×5): qty 50

## 2017-08-08 MED ORDER — SODIUM CHLORIDE 0.9 % IV SOLN
INTRAVENOUS | Status: AC
  Administered 2017-08-08: 02:00:00 via INTRAVENOUS

## 2017-08-08 MED ORDER — PANTOPRAZOLE SODIUM 40 MG PO TBEC
40.0000 mg | DELAYED_RELEASE_TABLET | Freq: Every day | ORAL | Status: DC
  Administered 2017-08-08 – 2017-08-09 (×2): 40 mg via ORAL
  Filled 2017-08-08 (×2): qty 1

## 2017-08-08 MED ORDER — ATORVASTATIN CALCIUM 10 MG PO TABS
10.0000 mg | ORAL_TABLET | Freq: Every day | ORAL | Status: DC
  Administered 2017-08-08 – 2017-08-09 (×2): 10 mg via ORAL
  Filled 2017-08-08 (×2): qty 1

## 2017-08-08 MED ORDER — MONTELUKAST SODIUM 10 MG PO TABS
10.0000 mg | ORAL_TABLET | Freq: Every day | ORAL | Status: DC
  Administered 2017-08-08 (×2): 10 mg via ORAL
  Filled 2017-08-08 (×2): qty 1

## 2017-08-08 MED ORDER — ACETAMINOPHEN 650 MG RE SUPP: 650.0000 mg | Freq: Four times a day (QID) | RECTAL | Status: DC | PRN

## 2017-08-08 MED ORDER — MORPHINE SULFATE (PF) 2 MG/ML IV SOLN: 2.0000 mg | INTRAVENOUS | Status: DC | PRN

## 2017-08-08 MED ORDER — ONDANSETRON HCL 4 MG/2ML IJ SOLN: 4.0000 mg | Freq: Four times a day (QID) | INTRAMUSCULAR | Status: DC | PRN

## 2017-08-08 MED ORDER — ENOXAPARIN SODIUM 40 MG/0.4ML ~~LOC~~ SOLN
40.0000 mg | SUBCUTANEOUS | Status: DC
  Administered 2017-08-08: 40 mg via SUBCUTANEOUS
  Filled 2017-08-08: qty 0.4

## 2017-08-08 MED ORDER — ASPIRIN 81 MG PO CHEW
81.0000 mg | CHEWABLE_TABLET | Freq: Every day | ORAL | Status: DC
  Administered 2017-08-08 – 2017-08-09 (×2): 81 mg via ORAL
  Filled 2017-08-08 (×2): qty 1

## 2017-08-08 MED ORDER — SODIUM CHLORIDE 0.9% FLUSH
10.0000 mL | INTRAVENOUS | Status: DC | PRN
Start: 2017-08-08 — End: 2017-08-10

## 2017-08-08 MED ORDER — TIOTROPIUM BROMIDE MONOHYDRATE 18 MCG IN CAPS
18.0000 ug | ORAL_CAPSULE | RESPIRATORY_TRACT | Status: DC
  Administered 2017-08-08 – 2017-08-09 (×2): 18 ug via RESPIRATORY_TRACT
  Filled 2017-08-08: qty 5

## 2017-08-08 NOTE — Progress Notes (Signed)
Nutrition  Patient scheduled for nutrition follow-up appointment today.  Chart reviewed and noted hospital admission.  Will follow-up as able.  Arianna Delsanto B. Zenia Resides, [place], [place] Registered Dietitian [phone] (pager)

## 2017-08-08 NOTE — Progress Notes (Signed)
[facility] NAME: Sophia Nelson    MR#:  [id number]  DATE OF BIRTH:  24-Jan-1946  SUBJECTIVE:  CHIEF COMPLAINT:  Pt says abd pain is ok, wants to eat. Reporting pain in the port area, RN could nt access port.  Patient is refusing gallbladder surgery  REVIEW OF SYSTEMS:  CONSTITUTIONAL: No fever, fatigue or weakness.  EYES: No blurred or double vision.  EARS, NOSE, AND THROAT: No tinnitus or ear pain.  RESPIRATORY: No cough, shortness of breath, wheezing or hemoptysis.  CARDIOVASCULAR: No chest pain, orthopnea, edema.  Port-A-Cath site is painful GASTROINTESTINAL: No nausea, vomiting, diarrhea or abdominal pain.  GENITOURINARY: No dysuria, hematuria.  ENDOCRINE: No polyuria, nocturia,  HEMATOLOGY: No anemia, easy bruising or bleeding SKIN: No rash or lesion. MUSCULOSKELETAL: No joint pain or arthritis.   NEUROLOGIC: No tingling, numbness, weakness.  PSYCHIATRY: No anxiety or depression.   DRUG ALLERGIES:   Allergies  Allergen Reactions  . Other Other (See Comments)    Onions(that grow in the yard-pt can eat onions without problems) and dust mite Sneezing, cough, runny nose    VITALS:  Blood pressure (!) 113/53, pulse 86, temperature 97.6 F (36.4 C), resp. rate 19, height 5\' 2"  (1.575 m), weight 51.7 kg (114 lb), SpO2 100 %.  PHYSICAL EXAMINATION:  GENERAL:  71 y.o.-year-old patient lying in the bed with no acute distress.  EYES: Pupils equal, round, reactive to light and accommodation. No scleral icterus. Extraocular muscles intact.  HEENT: Head atraumatic, normocephalic. Oropharynx and nasopharynx clear.  NECK:  Supple, no jugular venous distention. No thyroid enlargement, no tenderness.  LUNGS: Normal breath sounds bilaterally, no wheezing, rales,rhonchi or crepitation. No use of accessory muscles of respiration.  CARDIOVASCULAR: S1, S2 normal. No murmurs, rubs, or gallops.  Anterior chest wall with Port-A-Cath  very tender to touch ABDOMEN: Soft, nontender, nondistended. Bowel sounds present. [name]  EXTREMITIES: No pedal edema, cyanosis, or clubbing.  NEUROLOGIC: Cranial nerves II through XII are intact. Muscle strength 5/5 in all extremities. Sensation intact. Gait not checked.  PSYCHIATRIC: The patient is alert and oriented x 3.  SKIN: No obvious rash, lesion, or ulcer.    LABORATORY PANEL:   CBC Recent Labs  Lab 08/08/17 0445  WBC 6.7  HGB 7.5*  HCT 22.5*  PLT 230   ------------------------------------------------------------------------------------------------------------------  Chemistries  Recent Labs  Lab 08/07/17 1647 08/08/17 0445  NA 137 135  K 3.6 3.4*  CL 108 108  CO2 21* 20*  GLUCOSE 101* 77  BUN 8 8  CREATININE 0.37* 0.49  CALCIUM 7.7* 7.3*  AST 106*  --   ALT 20  --   ALKPHOS 209*  --   BILITOT 1.5*  --    ------------------------------------------------------------------------------------------------------------------  Cardiac Enzymes Recent Labs  Lab 08/07/17 2050  TROPONINI <0.03   ------------------------------------------------------------------------------------------------------------------  RADIOLOGY:  Dg Chest 2 View  Result Date: 08/07/2017 CLINICAL DATA:  Right-sided chest pain. History of breast cancer currently undergoing chemotherapy. EXAM: CHEST  2 VIEW COMPARISON:  08/01/2017 FINDINGS: A right subclavian Port-A-Cath terminates near the cavoatrial junction. The cardiac silhouette is normal in size. Aortic atherosclerosis is noted. The lungs are hyperinflated with mild bronchitic changes. No confluent airspace opacity, edema, pleural effusion, or pneumothorax is identified. No acute osseous abnormality is seen. IMPRESSION: COPD without evidence of active cardiopulmonary disease. Electronically Signed   By: Logan Bores M.D.   On: 08/07/2017 16:44   Ct Angio Chest Pe W And/or Wo Contrast  Result Date: 08/07/2017 CLINICAL DATA:  Right-sided  chest pain, onset earlier today. EXAM: CT ANGIOGRAPHY CHEST WITH CONTRAST TECHNIQUE: Multidetector CT imaging of the chest was performed using the standard protocol during bolus administration of intravenous contrast. Multiplanar CT image reconstructions and MIPs were obtained to evaluate the vascular anatomy. CONTRAST:  39mL ISOVUE-370 IOPAMIDOL (ISOVUE-370) INJECTION 76% COMPARISON:  Radiographs 08/07/2017, MRI 03/28/2017, PET-CT 03/07/2017, CT 02/24/2017. FINDINGS: Cardiovascular: Satisfactory opacification of the pulmonary arteries to the segmental level. No evidence of pulmonary embolism. Normal heart size. No pericardial effusion. The thoracic aorta is normal in caliber and intact. There is extensive atherosclerotic calcification of the aorta and coronary arteries. Mediastinum/Nodes: No enlarged mediastinal, hilar, or axillary lymph nodes. Thyroid gland, trachea, and esophagus demonstrate no significant findings. Lungs/Pleura: Lungs are clear except for upper lobe centrilobular emphysematous disease. No pleural effusion or pneumothorax. Upper Abdomen: Marked gastric mural thickening, new from 02/24/2017 and 03/07/2017. Prominent distension of the duodenal bulb, incompletely imaged but this was present on the prior studies. Musculoskeletal: No significant skeletal lesions. Review of the MIP images confirms the above findings. IMPRESSION: 1. Negative for acute pulmonary embolism. No acute findings are evident in the chest. 2. Marked gastric mural thickening, new. This may represent gastritis. Incompletely imaged. 3. Extensive aortic atherosclerosis. Centrilobular emphysematous disease. Electronically Signed   By: Andreas Newport M.D.   On: 08/07/2017 18:19   Dg Abd Acute W/chest  Result Date: 08/08/2017 CLINICAL DATA:  Abdominal pain and ileus EXAM: DG ABDOMEN ACUTE W/ 1V CHEST COMPARISON:  08/07/2017, CT from 07/29/2017 FINDINGS: Cardiac shadow is within normal limits. Right-sided chest wall port is  noted. Aortic calcifications are again seen. The lungs are clear but mildly hyperinflated. Scattered large and small bowel gas is noted. The degree of gastric distention has improved. Postsurgical changes are noted consistent with the given clinical history. Degenerative changes of lumbar spine and hip joints are noted. No free air is seen. IMPRESSION: Improved bowel gas pattern without definitive obstructive change. No acute abnormality is noted in the chest. Electronically Signed   By: Inez Catalina M.D.   On: 08/08/2017 16:03   US Abdomen Limited Ruq  Result Date: 08/07/2017 CLINICAL DATA:  Right-sided chest pain. Elevated liver function tests. Patient is being treated with chemotherapy for breast cancer. EXAM: ULTRASOUND ABDOMEN LIMITED RIGHT UPPER QUADRANT COMPARISON:  None. FINDINGS: Gallbladder: The gallbladder is normally distended. There is mild thickening of the gallbladder wall measuring 3.5 mm. Small amount of pericholecystic fluid is also seen. Possible tiny 3 mm calculus within the fundus of the gallbladder. Common bile duct: Diameter: Extrahepatic common bile duct measures 1 cm. Liver: No focal lesion identified. Diffusely increased parenchymal echogenicity. Portal vein is patent on color Doppler imaging with normal direction of blood flow towards the liver. IMPRESSION: Mild gallbladder wall thickening and small amount of pericholecystic fluid. These may represent inflammatory changes of the gallbladder, however Murphy's sign was reported as negative, which makes acute cholecystitis less likely. If further imaging evaluation is desired, hepatobiliary nuclear medicine scan may be considered. Dilation of the extrahepatic common bile duct to 1 cm. In correlation with prior CT, this represents a chronic finding. Electronically Signed   By: Fidela Salisbury M.D.   On: 08/07/2017 21:04    EKG:   Orders placed or performed during the hospital encounter of 08/07/17  . ED EKG within 10 minutes  .  ED EKG within 10 minutes    ASSESSMENT AND PLAN:   Cholecystitis - Pt is not a surgical  candidate 2/2 cancer burden per ED d/w surgery IV Zosyn, PRN analgesia and antiemetics Diet as tolerated Abdominal series with no signs of obstruction  Port infection? Placed on 12/6 Consult placed to dr.Shankar  H/o breast cancer with mets  F/u with Dr.Brahmonday    Diabetes ([place]) -sliding scale insulin with corresponding glucose checks    COPD (chronic obstructive pulmonary disease) ([place]) -continue home meds    GERD (gastroesophageal reflux disease) - PPI       All the records are reviewed and case discussed with Care Management/Social Workerr. Management plans discussed with the patient, oncologist and they are in agreement.  CODE STATUS: FC  TOTAL TIME TAKING CARE OF THIS PATIENT: 36 minutes.   POSSIBLE D/C IN 1-2 DAYS, DEPENDING ON CLINICAL CONDITION.  Note: This dictation was prepared with Dragon dictation along with smaller phrase technology. Any transcriptional errors that result from this process are unintentional.   Nicholes Mango M.D on 08/08/2017 at 9:53 PM  Between 7am to 6pm - Pager - [phone] After 6pm go to [email or web address] - password [email or web address] [facility]  [facility]  Office  [phone]  CC: Primary care physician; Donnie Coffin, MD

## 2017-08-08 NOTE — Progress Notes (Signed)
Pharmacy Antibiotic Note  Sophia Nelson is a 71 y.o. female admitted on 08/07/2017 with cholecystitis.  Pharmacy has been consulted for Zosyn dosing.  Plan: Zosyn 3.375g IV q8h (4 hour infusion).  Height: 5\' 2"  (157.5 cm) Weight: 114 lb (51.7 kg) IBW/kg (Calculated) : 50.1  Temp (24hrs), Avg:98.6 F (37 C), Min:97.7 F (36.5 C), Max:99.5 F (37.5 C)  Recent Labs  Lab 08/02/17 0900 08/07/17 1647  WBC 6.7 5.2  CREATININE 0.59 0.37*    Estimated Creatinine Clearance: 52.5 mL/min (A) (by C-G formula based on SCr of 0.37 mg/dL (L)).    Allergies  Allergen Reactions  . Other Other (See Comments)    Onions(that grow in the yard-pt can eat onions without problems) and dust mite Sneezing, cough, runny nose    Antimicrobials this admission: Zosyn 12/12  >>    >>   Dose adjustments this admission:   Microbiology results: 10/14 MRSA PCR: (-)   Thank you for allowing pharmacy to be a part of this patient's care.  Aubrii Sharpless S 08/08/2017 3:01 AM

## 2017-08-08 NOTE — Progress Notes (Signed)
Called by assigned RN to assess port. Port not flushing, pink, and tender. Port deaccessed by Probation officer. Assigned RN made aware at bedside.   Attending MD paged and made aware of above. 10:07 AM Madlyn Frankel, RN

## 2017-08-09 ENCOUNTER — Inpatient Hospital Stay: Payer: Medicare HMO

## 2017-08-09 LAB — HEMOGLOBIN AND HEMATOCRIT, BLOOD
HEMATOCRIT: 27.2 % — AB (ref 35.0–47.0)
HEMOGLOBIN: 9.2 g/dL — AB (ref 12.0–16.0)

## 2017-08-09 LAB — COMPREHENSIVE METABOLIC PANEL
ALK PHOS: 211 U/L — AB (ref 38–126)
ALT: 26 U/L (ref 14–54)
AST: 55 U/L — ABNORMAL HIGH (ref 15–41)
Albumin: 1.8 g/dL — ABNORMAL LOW (ref 3.5–5.0)
Anion gap: 4 — ABNORMAL LOW (ref 5–15)
BUN: 6 mg/dL (ref 6–20)
CALCIUM: 7.7 mg/dL — AB (ref 8.9–10.3)
CO2: 25 mmol/L (ref 22–32)
Chloride: 108 mmol/L (ref 101–111)
Creatinine, Ser: 0.34 mg/dL — ABNORMAL LOW (ref 0.44–1.00)
GFR calc non Af Amer: >60 mL/min (ref >60)
Glucose, Bld: 88 mg/dL (ref 65–99)
Potassium: 2.8 mmol/L — ABNORMAL LOW (ref 3.5–5.1)
SODIUM: 137 mmol/L (ref 135–145)
Total Bilirubin: 0.9 mg/dL (ref 0.3–1.2)
Total Protein: 4.5 g/dL — ABNORMAL LOW (ref 6.5–8.1)

## 2017-08-09 LAB — PREPARE RBC (CROSSMATCH)

## 2017-08-09 LAB — CBC
HCT: 21.1 % — ABNORMAL LOW (ref 35.0–47.0)
Hemoglobin: 7.1 g/dL — ABNORMAL LOW (ref 12.0–16.0)
MCH: 29.8 pg (ref 26.0–34.0)
MCHC: 33.5 g/dL (ref 32.0–36.0)
MCV: 88.9 fL (ref 80.0–100.0)
PLATELETS: 284 10*3/uL (ref 150–440)
RBC: 2.37 MIL/uL — ABNORMAL LOW (ref 3.80–5.20)
RDW: 17.9 % — ABNORMAL HIGH (ref 11.5–14.5)
WBC: 4.2 10*3/uL (ref 3.6–11.0)

## 2017-08-09 LAB — MAGNESIUM
Magnesium: 1.5 mg/dL — ABNORMAL LOW (ref 1.7–2.4)
Magnesium: 1.9 mg/dL (ref 1.7–2.4)

## 2017-08-09 LAB — POTASSIUM: POTASSIUM: 4.3 mmol/L (ref 3.5–5.1)

## 2017-08-09 MED ORDER — MAGNESIUM SULFATE 2 GM/50ML IV SOLN
2.0000 g | Freq: Once | INTRAVENOUS | Status: AC
  Administered 2017-08-09: 2 g via INTRAVENOUS
  Filled 2017-08-09: qty 50

## 2017-08-09 MED ORDER — SODIUM CHLORIDE 0.9 % IV SOLN
Freq: Once | INTRAVENOUS | Status: AC
  Administered 2017-08-09: 16:00:00 via INTRAVENOUS

## 2017-08-09 MED ORDER — AMOXICILLIN-POT CLAVULANATE 875-125 MG PO TABS
1.0000 | ORAL_TABLET | Freq: Two times a day (BID) | ORAL | 0 refills | Status: AC
Start: 2017-08-09 — End: 2017-08-23

## 2017-08-09 MED ORDER — POTASSIUM CHLORIDE CRYS ER 20 MEQ PO TBCR
40.0000 meq | EXTENDED_RELEASE_TABLET | ORAL | Status: AC
  Administered 2017-08-09 (×2): 40 meq via ORAL
  Filled 2017-08-09 (×2): qty 2

## 2017-08-09 MED ORDER — POTASSIUM CHLORIDE CRYS ER 10 MEQ PO TBCR
10.0000 meq | EXTENDED_RELEASE_TABLET | Freq: Once | ORAL | Status: AC
  Administered 2017-08-09: 10 meq via ORAL
  Filled 2017-08-09: qty 1

## 2017-08-09 MED ORDER — DOCUSATE SODIUM 100 MG PO CAPS: 100.0000 mg | ORAL_CAPSULE | Freq: Two times a day (BID) | ORAL | Status: DC

## 2017-08-09 MED ORDER — POTASSIUM CHLORIDE 10 MEQ/100ML IV SOLN
10.0000 meq | INTRAVENOUS | Status: DC
  Administered 2017-08-09: 10 meq via INTRAVENOUS
  Filled 2017-08-09: qty 100

## 2017-08-09 NOTE — Progress Notes (Signed)
RN took patient out via wheelchair, assisted with placing patient in son's car.  Nurse secretary informed of time patient left floor 2110

## 2017-08-09 NOTE — Care Management Important Message (Signed)
Important Message  Patient Details  Name: [name] MRN: [id number] Date of Birth: 28-Nov-1945   Medicare Important Message Given:  N/A - LOS <3 / Initial given by admissions    Beverly Sessions, RN 08/09/2017, 11:18 AM

## 2017-08-09 NOTE — Discharge Instructions (Signed)
Follow-up with primary care physician in a week and follow-up with oncology in a week

## 2017-08-09 NOTE — Progress Notes (Signed)
Patient discharge teaching given, patient verbalized understanding of discharge instruction. Patient asked if any of her medications would interact with the Augmentin that she has been been newly prescribed. Patient assured that medications are reviewed by pharmacy and providers prior to prescribing to assure no counter reaction. Blood transfusion continues infusing and patient will discharge from unit upon completion without any adverse reactions. Patient will be transported home with by her son.

## 2017-08-09 NOTE — Consult Note (Signed)
MEDICATION RELATED CONSULT NOTE - INITIAL   Pharmacy Consult for electrolytes Indication: hypokalemia, hypomag  Allergies  Allergen Reactions  . Other Other (See Comments)    Onions(that grow in the yard-pt can eat onions without problems) and dust mite Sneezing, cough, runny nose    Patient Measurements: Height: 5\' 2"  (157.5 cm) Weight: 114 lb (51.7 kg) IBW/kg (Calculated) : 50.1 Adjusted Body Weight:   Vital Signs: Temp: 98.1 F (36.7 C) (12/14 0647) Temp Source: Oral (12/14 0647) BP: 130/66 (12/14 0647) Pulse Rate: 82 (12/14 0647) Intake/Output from previous day: 12/13 0701 - 12/14 0700 In: 170 [P.O.:120; IV Piggyback:50] Out: 300 [Urine:300] Intake/Output from this shift: Total I/O In: 120 [P.O.:120] Out: 0   Labs: Recent Labs    08/07/17 1647 08/08/17 0445 08/09/17 0430 08/09/17 1146  WBC 5.2 6.7 4.2  --   HGB 8.7* 7.5* 7.1*  --   HCT 26.8* 22.5* 21.1*  --   PLT 280 230 284  --   CREATININE 0.37* 0.49 0.34*  --   MG  --   --   --  1.5*  ALBUMIN 2.3*  --  1.8*  --   PROT 5.1*  --  4.5*  --   AST 106*  --  55*  --   ALT 20  --  26  --   ALKPHOS 209*  --  211*  --   BILITOT 1.5*  --  0.9  --   BILIDIR 0.8*  --   --   --   IBILI 0.7  --   --   --    Estimated Creatinine Clearance: 52.5 mL/min (A) (by C-G formula based on SCr of 0.34 mg/dL (L)).   Microbiology: No results found for this or any previous visit (from the past 720 hour(s)).  Medical History: Past Medical History:  Diagnosis Date  . Abdominal distention   . AKI (acute kidney injury) ([place]) 02/24/2017  . Anemia   . Arthritis   . Asthma   . Back pain, chronic 03/15/2017  . Breast cancer ([place] ) 2009   RT MASTECTOMY, DCIS  . 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 Cancer of left breast ([place]) 08/09/2015   T4 N1 M1 tumor, INVASIVE LOBULAR CARCINOMA.  . Carcinoma of overlapping sites of left breast  in female, estrogen receptor positive ([place]) 03/22/2016  . COPD (chronic obstructive pulmonary disease) ([place])   . Diabetes ([place]) 02/24/2017  . Diabetes mellitus without complication ([place])   . Duodenal obstruction   . Encounter for nasogastric (NG) tube placement   . Gastric outlet obstruction 02/24/2017  . GERD (gastroesophageal reflux disease)   . Headache    h/o migraines as a child  . Hypertension   . Neck pain 04/19/2016  . Pancreatitis, acute 03/04/2017  . Pneumonia 2015  . Recurrent low back pain 03/15/2017  . Shortness of breath dyspnea    with exertion    Medications:  Scheduled:  . aspirin  81 mg Oral Daily  . atorvastatin  10 mg Oral Daily  . enoxaparin (LOVENOX) injection  40 mg Subcutaneous Q24H  . mometasone-formoterol  2 puff Inhalation BID  . montelukast  10 mg Oral QHS  . pantoprazole  40 mg Oral Daily  . potassium chloride  40 mEq Oral Q4H  . tiotropium  18 mcg Inhalation BH-q7a    Assessment: [name] admitted for upper quadrent pain.  Found to have K of 2.8 and Mg of 1.5 this AM  Goal of Therapy:  Normalization of electrolytes  Plan:  Prime Dr has already ordered KCL IV 10MEQ x4 and KCL po 40 MEQ x2 doses. Will continue with these orders and add Mg IV 2g once. Recheck at 1800.  Ramond Dial, Pharm.D, BCPS Clinical Pharmacist  08/09/2017,1:08 PM

## 2017-08-09 NOTE — Progress Notes (Signed)
First shift RN provided discharge teaching, patient verbalized understanding of discharge instruction with no further questions at this time. Ordered labs drawn with no issues, port de -accessed and covered with non adherent dressing. RN encouraged patient to call with any questions or concerns. Patient will be transported home with by her son.

## 2017-08-09 NOTE — Progress Notes (Signed)
Drew labs ordered from port line with no issues.  RN informed lab of issues with label printer and attached chart labels; spoke with [place].

## 2017-08-09 NOTE — Discharge Summary (Signed)
[facility] NAME: Sophia Nelson    MR#:  [id number]  DATE OF BIRTH:  [birth year]  DATE OF ADMISSION:  08/07/2017 ADMITTING PHYSICIAN: Bethann Goo, MD  DATE OF DISCHARGE:  08/09/17  PRIMARY CARE PHYSICIAN: Donnie Coffin, MD    ADMISSION DIAGNOSIS:  Cholecystitis [K81.9] Right upper quadrant abdominal pain [R10.11]  DISCHARGE DIAGNOSIS:  Principal Problem:   Cholecystitis Active Problems:   GERD (gastroesophageal reflux disease)   Diabetes (HCC)   COPD (chronic obstructive pulmonary disease) (HCC)   Metastatic breast cancer ([place])   SECONDARY DIAGNOSIS:   Past Medical History:  Diagnosis Date  . Abdominal distention   . AKI (acute kidney injury) ([place]) 02/24/2017  . Anemia   . Arthritis   . Asthma   . Back pain, chronic 03/15/2017  . Breast cancer ([place]) 2009   RT MASTECTOMY, DCIS  . 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 Cancer of left breast ([place]) 08/09/2015   T4 N1 M1 tumor, INVASIVE LOBULAR CARCINOMA.  . Carcinoma of overlapping sites of left breast in female, estrogen receptor positive ([place]) 03/22/2016  . COPD (chronic obstructive pulmonary disease) ([place])   . Diabetes ([place]) 02/24/2017  . Diabetes mellitus without complication ([place])   . Duodenal obstruction   . Encounter for nasogastric (NG) tube placement   . Gastric outlet obstruction 02/24/2017  . GERD (gastroesophageal reflux disease)   . Headache    h/o migraines as a child  . Hypertension   . Neck pain 04/19/2016  . Pancreatitis, acute 03/04/2017  . Pneumonia 2015  . Recurrent low back pain 03/15/2017  . Shortness of breath dyspnea    with exertion    HOSPITAL COURSE:   HPI  Sophia Nelson  is a 71 y.o. female who presents with complaint of right sided lower thoracic pain, right upper quadrant pain.  Here in the ED on imaging she was found to have some  pericholecystic fluid suspicious for cholecystitis.  Surgery consulted by ED, but knows the patient and due to her significant cancer burden she is not a surgical candidate.  They recommended admission to hospitalist with IV antibiotics.  Hospitalist were called for the same  Cholecystitis - Pt is not a surgical candidate 2/2 cancer burden per ED d/w surgery IV Zosyn and clinically patient improved and was discharged with by mouth Augmentin  PRN analgesics Diet as tolerated Abdominal series with no signs of obstruction  Port infection-ruled out Placed on 12/6 Consult placed to dr.Shankar, seen and evaluated no interventions needed at this time okay to use the port   Anemia - ch No active bleeding Unit of blood transfusion and outpatient follow-up with oncology  Hypokalemia and hypomagnesemia Repleted  H/o breast cancer with mets  F/u with Dr.Brahmonday  Diabetes ([place]) -sliding scale insulin with corresponding glucose checks  COPD (chronic obstructive pulmonary disease) ([place]) -continue home meds  GERD (gastroesophageal reflux disease) - PPI    DISCHARGE CONDITIONS:   stable  CONSULTS OBTAINED:  Treatment Team:  Christene Lye, MD   PROCEDURES  None   DRUG ALLERGIES:   Allergies  Allergen Reactions  . Other Other (See Comments)    Onions(that grow in the yard-pt can eat onions without problems) and dust mite Sneezing, cough, runny nose    DISCHARGE MEDICATIONS:   Allergies as of 08/09/2017      Reactions  Other Other (See Comments)   Onions(that grow in the yard-pt can eat onions without problems) and dust mite Sneezing, cough, runny nose      Medication List    TAKE these medications   acetaminophen 325 MG tablet Commonly known as:  TYLENOL Take 1 tablet (325 mg total) by mouth every 6 (six) hours as needed for mild pain (or Fever >/= 101).   ADVAIR DISKUS 500-50 MCG/DOSE Aepb Generic drug:  Fluticasone-Salmeterol Inhale 1 puff  into the lungs 2 (two) times daily.   albuterol 108 (90 Base) MCG/ACT inhaler Commonly known as:  PROVENTIL HFA;VENTOLIN HFA Inhale 2 puffs into the lungs every 6 (six) hours as needed for wheezing or shortness of breath.   amoxicillin-clavulanate 875-125 MG tablet Commonly known as:  AUGMENTIN Take 1 tablet by mouth 2 (two) times daily for 14 days.   aspirin 81 MG tablet Take 81 mg by mouth daily.   atorvastatin 10 MG tablet Commonly known as:  LIPITOR Take 10 mg by mouth daily.   docusate sodium 100 MG capsule Commonly known as:  COLACE Take 100 mg by mouth 2 (two) times daily.   ergocalciferol 50000 units capsule Commonly known as:  VITAMIN D2 Take 1 capsule (50,000 Units total) by mouth once a week. What changed:  when to take this   feeding supplement (ENSURE ENLIVE) Liqd Take 237 mLs by mouth 3 (three) times daily between meals.   fluticasone 50 MCG/ACT nasal spray Commonly known as:  FLONASE Place 2 sprays into both nostrils daily.   guaiFENesin 600 MG 12 hr tablet Commonly known as:  MUCINEX Take 1 tablet (600 mg total) by mouth 2 (two) times daily as needed for cough or to loosen phlegm.   lidocaine-prilocaine cream Commonly known as:  EMLA Apply 1 application topically as needed (for port access).   loperamide 2 MG capsule Commonly known as:  IMODIUM One pill after each loose stool; maximum upto 8 pills a day. What changed:    how much to take  how to take this  when to take this  reasons to take this  additional instructions   loratadine 10 MG tablet Commonly known as:  CLARITIN Take 10 mg by mouth daily.   Magnesium Cl-Calcium Carbonate 70-117 MG Tbec Commonly known as:  SLOW MAGNESIUM/CALCIUM Take 1 tablet by mouth 2 (two) times daily.   magnesium hydroxide 400 MG/5ML suspension Commonly known as:  MILK OF MAGNESIA Take 30-45 mLs by mouth daily as needed for mild constipation.   metoCLOPramide 10 MG tablet Commonly known as:   REGLAN Take 1 tablet (10 mg total) by mouth 4 (four) times daily.   montelukast 10 MG tablet Commonly known as:  SINGULAIR Take 10 mg by mouth at bedtime.   omeprazole 20 MG capsule Commonly known as:  PRILOSEC Take 20 mg by mouth every morning.   ondansetron 8 MG disintegrating tablet Commonly known as:  ZOFRAN ODT Take 1 tablet (8 mg total) by mouth every 8 (eight) hours as needed for nausea or vomiting.   oxyCODONE-acetaminophen 5-325 MG tablet Commonly known as:  PERCOCET/ROXICET Take 1 tablet by mouth every 8 (eight) hours as needed for severe pain.   OXYGEN Inhale 2 L into the lungs continuous.   prochlorperazine 10 MG tablet Commonly known as:  COMPAZINE Take 1 tablet (10 mg total) by mouth every 6 (six) hours as needed for nausea or vomiting.   senna-docusate 8.6-50 MG tablet Commonly known as:  Senokot-S Take 1 tablet by mouth  at bedtime as needed for mild constipation.   SPIRIVA HANDIHALER 18 MCG inhalation capsule Generic drug:  tiotropium Place 18 mcg into inhaler and inhale every morning.        DISCHARGE INSTRUCTIONS:   Follow-up with primary care physician in a week and follow-up with oncology in a week  Continue oxygen 2 L by nasal cannula  DIET:  Diabetic diet  DISCHARGE CONDITION:  Stable  ACTIVITY:  Activity as tolerated  OXYGEN:  Home Oxygen: Yes.     Oxygen Delivery: 2 liters/min via Patient connected to nasal cannula oxygen  DISCHARGE LOCATION:  home   If you experience worsening of your admission symptoms, develop shortness of breath, life threatening emergency, suicidal or homicidal thoughts you must seek medical attention immediately by calling 911 or calling your MD immediately  if symptoms less severe.  You Must read complete instructions/literature along with all the possible adverse reactions/side effects for all the Medicines you take and that have been prescribed to you. Take any new Medicines after you have completely  understood and accpet all the possible adverse reactions/side effects.   Please note  You were cared for by a hospitalist during your hospital stay. If you have any questions about your discharge medications or the care you received while you were in the hospital after you are discharged, you can call the unit and asked to speak with the hospitalist on call if the hospitalist that took care of you is not available. Once you are discharged, your primary care physician will handle any further medical issues. Please note that NO REFILLS for any discharge medications will be authorized once you are discharged, as it is imperative that you return to your primary care physician (or establish a relationship with a primary care physician if you do not have one) for your aftercare needs so that they can reassess your need for medications and monitor your lab values.     Today  Chief Complaint  Patient presents with  . Chest Pain    Patient's abdominal pain is resolved. Tolerating diet and wants to go home. She will receive 1 unit of blood transfusion. Denies any active bleeding. ROS:  CONSTITUTIONAL: Denies fevers, chills. Denies any fatigue, weakness.  EYES: Denies blurry vision, double vision, eye pain. EARS, NOSE, THROAT: Denies tinnitus, ear pain, hearing loss. RESPIRATORY: Denies cough, wheeze, shortness of breath.  CARDIOVASCULAR: Denies chest pain, palpitations, edema.  GASTROINTESTINAL: Denies nausea, vomiting, diarrhea, abdominal pain. Denies bright red blood per rectum. GENITOURINARY: Denies dysuria, hematuria. ENDOCRINE: Denies nocturia or thyroid problems. HEMATOLOGIC AND LYMPHATIC: Denies easy bruising or bleeding. SKIN: Denies rash or lesion. MUSCULOSKELETAL: Denies pain in neck, back, shoulder, knees, hips or arthritic symptoms.  NEUROLOGIC: Denies paralysis, paresthesias.  PSYCHIATRIC: Denies anxiety or depressive symptoms.   VITAL SIGNS:  Blood pressure 130/66, pulse 82,  temperature 98.1 F (36.7 C), temperature source Oral, resp. rate 18, height 5\' 2"  (1.575 m), weight 51.7 kg (114 lb), SpO2 100 %.  I/O:    Intake/Output Summary (Last 24 hours) at 08/09/2017 1542 Last data filed at 08/09/2017 1412 Gross per 24 hour  Intake 650 ml  Output 300 ml  Net 350 ml    PHYSICAL EXAMINATION:  GENERAL:  71 y.o.-year-old patient lying in the bed with no acute distress.  EYES: Pupils equal, round, reactive to light and accommodation. No scleral icterus. Extraocular muscles intact.  HEENT: Head atraumatic, normocephalic. Oropharynx and nasopharynx clear.  NECK:  Supple, no jugular venous distention. No thyroid enlargement,  no tenderness.  LUNGS: Normal breath sounds bilaterally, no wheezing, rales,rhonchi or crepitation. No use of accessory muscles of respiration. Anterior chest wall Port-A-Cath site is tender CARDIOVASCULAR: S1, S2 normal. No murmurs, rubs, or gallops.  ABDOMEN: Soft, non-tender, non-distended. Bowel sounds present.  EXTREMITIES: No pedal edema, cyanosis, or clubbing.  NEUROLOGIC: Cranial nerves II through XII are intact. Muscle strength 5/5 in all extremities. Sensation intact. Gait not checked.  PSYCHIATRIC: The patient is alert and oriented x 3.  SKIN: No obvious rash, lesion, or ulcer.   DATA REVIEW:   CBC Recent Labs  Lab 08/09/17 0430  WBC 4.2  HGB 7.1*  HCT 21.1*  PLT 284    Chemistries  Recent Labs  Lab 08/09/17 0430 08/09/17 1146  NA 137  --   K 2.8*  --   CL 108  --   CO2 25  --   GLUCOSE 88  --   BUN 6  --   CREATININE 0.34*  --   CALCIUM 7.7*  --   MG  --  1.5*  AST 55*  --   ALT 26  --   ALKPHOS 211*  --   BILITOT 0.9  --     Cardiac Enzymes Recent Labs  Lab 08/07/17 2050  TROPONINI <0.03    Microbiology Results  Results for orders placed or performed during the hospital encounter of 06/08/17  Urine culture     Status: None   Collection Time: 06/08/17 11:25 PM  Result Value Ref Range Status    Specimen Description URINE, RANDOM  Final   Special Requests NONE  Final   Culture   Final    NO GROWTH Performed at [facility], [place] [address], [place], [place] [address]    Report Status 06/10/2017 FINAL  Final  MRSA PCR Screening     Status: None   Collection Time: 06/09/17 11:50 AM  Result Value Ref Range Status   MRSA by PCR NEGATIVE NEGATIVE Final    Comment:        The GeneXpert MRSA Assay (FDA approved for NASAL specimens only), is one component of a comprehensive MRSA colonization surveillance program. It is not intended to diagnose MRSA infection nor to guide or monitor treatment for MRSA infections.     RADIOLOGY:  Dg Chest 2 View  Result Date: 08/07/2017 CLINICAL DATA:  Right-sided chest pain. History of breast cancer currently undergoing chemotherapy. EXAM: CHEST  2 VIEW COMPARISON:  08/01/2017 FINDINGS: A right subclavian Port-A-Cath terminates near the cavoatrial junction. The cardiac silhouette is normal in size. Aortic atherosclerosis is noted. The lungs are hyperinflated with mild bronchitic changes. No confluent airspace opacity, edema, pleural effusion, or pneumothorax is identified. No acute osseous abnormality is seen. IMPRESSION: COPD without evidence of active cardiopulmonary disease. Electronically Signed   By: Logan Bores M.D.   On: 08/07/2017 16:44   Ct Angio Chest Pe W And/or Wo Contrast  Result Date: 08/07/2017 CLINICAL DATA:  Right-sided chest pain, onset earlier today. EXAM: CT ANGIOGRAPHY CHEST WITH CONTRAST TECHNIQUE: Multidetector CT imaging of the chest was performed using the standard protocol during bolus administration of intravenous contrast. Multiplanar CT image reconstructions and MIPs were obtained to evaluate the vascular anatomy. CONTRAST:  64mL ISOVUE-370 IOPAMIDOL (ISOVUE-370) INJECTION 76% COMPARISON:  Radiographs 08/07/2017, MRI 03/28/2017, PET-CT 03/07/2017, CT 02/24/2017. FINDINGS: Cardiovascular: Satisfactory opacification  of the pulmonary arteries to the segmental level. No evidence of pulmonary embolism. Normal heart size. No pericardial effusion. The thoracic aorta is normal in  caliber and intact. There is extensive atherosclerotic calcification of the aorta and coronary arteries. Mediastinum/Nodes: No enlarged mediastinal, hilar, or axillary lymph nodes. Thyroid gland, trachea, and esophagus demonstrate no significant findings. Lungs/Pleura: Lungs are clear except for upper lobe centrilobular emphysematous disease. No pleural effusion or pneumothorax. Upper Abdomen: Marked gastric mural thickening, new from 02/24/2017 and 03/07/2017. Prominent distension of the duodenal bulb, incompletely imaged but this was present on the prior studies. Musculoskeletal: No significant skeletal lesions. Review of the MIP images confirms the above findings. IMPRESSION: 1. Negative for acute pulmonary embolism. No acute findings are evident in the chest. 2. Marked gastric mural thickening, new. This may represent gastritis. Incompletely imaged. 3. Extensive aortic atherosclerosis. Centrilobular emphysematous disease. Electronically Signed   By: Andreas Newport M.D.   On: 08/07/2017 18:19   Dg Abd Acute W/chest  Result Date: 08/08/2017 CLINICAL DATA:  Abdominal pain and ileus EXAM: DG ABDOMEN ACUTE W/ 1V CHEST COMPARISON:  08/07/2017, CT from 07/29/2017 FINDINGS: Cardiac shadow is within normal limits. Right-sided chest wall port is noted. Aortic calcifications are again seen. The lungs are clear but mildly hyperinflated. Scattered large and small bowel gas is noted. The degree of gastric distention has improved. Postsurgical changes are noted consistent with the given clinical history. Degenerative changes of lumbar spine and hip joints are noted. No free air is seen. IMPRESSION: Improved bowel gas pattern without definitive obstructive change. No acute abnormality is noted in the chest. Electronically Signed   By: Inez Catalina M.D.   On:  08/08/2017 16:03   US Abdomen Limited Ruq  Result Date: 08/07/2017 CLINICAL DATA:  Right-sided chest pain. Elevated liver function tests. Patient is being treated with chemotherapy for breast cancer. EXAM: ULTRASOUND ABDOMEN LIMITED RIGHT UPPER QUADRANT COMPARISON:  None. FINDINGS: Gallbladder: The gallbladder is normally distended. There is mild thickening of the gallbladder wall measuring 3.5 mm. Small amount of pericholecystic fluid is also seen. Possible tiny 3 mm calculus within the fundus of the gallbladder. Common bile duct: Diameter: Extrahepatic common bile duct measures 1 cm. Liver: No focal lesion identified. Diffusely increased parenchymal echogenicity. Portal vein is patent on color Doppler imaging with normal direction of blood flow towards the liver. IMPRESSION: Mild gallbladder wall thickening and small amount of pericholecystic fluid. These may represent inflammatory changes of the gallbladder, however Murphy's sign was reported as negative, which makes acute cholecystitis less likely. If further imaging evaluation is desired, hepatobiliary nuclear medicine scan may be considered. Dilation of the extrahepatic common bile duct to 1 cm. In correlation with prior CT, this represents a chronic finding. Electronically Signed   By: Fidela Salisbury M.D.   On: 08/07/2017 21:04    EKG:   Orders placed or performed during the hospital encounter of 08/07/17  . ED EKG within 10 minutes  . ED EKG within 10 minutes      Management plans discussed with the patient, family and they are in agreement.  CODE STATUS:     Code Status Orders  (From admission, onward)        Start     Ordered   08/08/17 0135  Full code  Continuous     08/08/17 0134    Code Status History    Date Active Date Inactive Code Status Order ID Comments User Context   06/09/2017 03:31 06/17/2017 17:15 Full Code [id number]  Harvie Bridge, DO ED   03/04/2017 01:23 03/13/2017 20:56 Full Code [id number]   Harvie Bridge, DO Inpatient   02/24/2017 23:40 02/26/2017 17:06  Full Code [id number]  Lance Coon, MD Inpatient      TOTAL TIME TAKING CARE OF THIS PATIENT: 45  minutes.   Note: This dictation was prepared with Dragon dictation along with smaller phrase technology. Any transcriptional errors that result from this process are unintentional.   @[facility] @  on 08/09/2017 at 3:42 PM  Between 7am to 6pm - Pager - [phone]  After 6pm go to [email or web address] - password [email or web address] [facility]  [facility]  Office  [phone]  CC: Primary care physician; Donnie Coffin, MD

## 2017-08-09 NOTE — Progress Notes (Signed)
Patient ID: Sophia Nelson, female   DOB: [birth year], 71 y.o.   MRN: [id number] Surgery f/u. There was concern about accessing the port But it appears to working well now. No overt signs of infection.This is a fresh incision and looks as expected 1 week out.

## 2017-08-09 NOTE — Evaluation (Signed)
Physical Therapy Evaluation Patient Details Name: Sophia Nelson MRN: [id number] DOB: 28-Oct-1945 Today's Date: 08/09/2017   History of Present Illness  Pt admitted for cholecystitis with complaints of chest pain and R side pain, non surgical canidate at this time. History includes DM, COPD, and GERD. Pt is pending blood transfusion, Hgb at 7.1. Cleared to work with therapy prior to transfusion.  Clinical Impression  Pt is a pleasant 71 year old female who was admitted for cholecystitis about to receive blood transfusion at this time. Pt performs bed mobility independently, transfers with mod I, and ambulation with supervision and RW. All mobility performed on 2L of O2, chronic at baseline with sats WNL. Pt demonstrates all bed mobility/transfers/ambulation at baseline level. Pt reports she has had HHPT in past and remembers all the exercises. Encouraged to continue to stay active. Pt does not require any further PT needs at this time. Pt will be dc in house and does not require follow up. RN aware. Will dc current orders.      Follow Up Recommendations No PT follow up    Equipment Recommendations  None recommended by PT    Recommendations for Other Services       Precautions / Restrictions Precautions Precautions: Fall Restrictions Weight Bearing Restrictions: No      Mobility  Bed Mobility Overal bed mobility: Independent             General bed mobility comments: safe technique performed'  Transfers Overall transfer level: Modified independent Equipment used: Rolling walker (2 wheeled)             General transfer comment: safe technique with RW used. Upright posture noted  Ambulation/Gait Ambulation/Gait assistance: Supervision Ambulation Distance (Feet): 15 Feet Assistive device: Rolling walker (2 wheeled) Gait Pattern/deviations: Step-to pattern     General Gait Details: ambulated in room towards recliner. Further ambulation deferred as she is getting  ready for blood transfusion. Reciprocal gait pattern noted. No dizziness.   Stairs            Wheelchair Mobility    Modified Rankin (Stroke Patients Only)       Balance Overall balance assessment: No apparent balance deficits (not formally assessed)                                           Pertinent Vitals/Pain Pain Assessment: No/denies pain    Home Living Family/patient expects to be discharged to:: Private residence Living Arrangements: Children Available Help at Discharge: Family;Available 24 hours/day Type of Home: House Home Access: Stairs to enter Entrance Stairs-Rails: Right;Left;Can reach both Entrance Stairs-Number of Steps: 5 Home Layout: One level Home Equipment: Walker - 2 wheels;Cane - single point      Prior Function Level of Independence: Independent with assistive device(s)         Comments: ambulates with SPC at all time, has help for IADLs from family.     Hand Dominance        Extremity/Trunk Assessment   Upper Extremity Assessment Upper Extremity Assessment: Overall WFL for tasks assessed    Lower Extremity Assessment Lower Extremity Assessment: Overall WFL for tasks assessed       Communication   Communication: No difficulties  Cognition Arousal/Alertness: Awake/alert Behavior During Therapy: WFL for tasks assessed/performed Overall Cognitive Status: Within Functional Limits for tasks assessed  General Comments      Exercises     Assessment/Plan    PT Assessment Patent does not need any further PT services  PT Problem List         PT Treatment Interventions      PT Goals (Current goals can be found in the Care Plan section)  Acute Rehab PT Goals Patient Stated Goal: to go home PT Goal Formulation: All assessment and education complete, DC therapy Time For Goal Achievement: 08/09/17 Potential to Achieve Goals: Good    Frequency      Barriers to discharge        Co-evaluation               AM-PAC PT "6 Clicks" Daily Activity  Outcome Measure Difficulty turning over in bed (including adjusting bedclothes, sheets and blankets)?: None Difficulty moving from lying on back to sitting on the side of the bed? : None Difficulty sitting down on and standing up from a chair with arms (e.g., wheelchair, bedside commode, etc,.)?: None Help needed moving to and from a bed to chair (including a wheelchair)?: None Help needed walking in hospital room?: A Little Help needed climbing 3-5 steps with a railing? : A Little 6 Click Score: 22    End of Session Equipment Utilized During Treatment: Gait belt;Oxygen Activity Tolerance: Patient tolerated treatment well Patient left: in chair;with chair alarm set Nurse Communication: Mobility status PT Visit Diagnosis: Difficulty in walking, not elsewhere classified (R26.2)    Time: 8144-8185 PT Time Calculation (min) (ACUTE ONLY): 25 min   Charges:   PT Evaluation $PT Eval Low Complexity: 1 Low     PT G Codes:   PT G-Codes **NOT FOR INPATIENT CLASS** Functional Assessment Tool Used: AM-PAC 6 Clicks Basic Mobility Functional Limitation: Mobility: Walking and moving around Mobility: Walking and Moving Around Current Status (U3149): At least 20 percent but less than 40 percent impaired, limited or restricted Mobility: Walking and Moving Around Goal Status [id number]): At least 1 percent but less than 20 percent impaired, limited or restricted    Greggory Stallion, PT, DPT [phone]   Sophia Nelson 08/09/2017, 3:46 PM

## 2017-08-09 NOTE — Progress Notes (Signed)
MEDICATION RELATED CONSULT NOTE - INITIAL   Pharmacy Consult for Electrolytes  Indication: K , Mag   Allergies  Allergen Reactions  . Other Other (See Comments)    Onions(that grow in the yard-pt can eat onions without problems) and dust mite Sneezing, cough, runny nose    Patient Measurements: Height: 5\' 2"  (157.5 cm) Weight: 114 lb (51.7 kg) IBW/kg (Calculated) : 50.1 Adjusted Body Weight:   Vital Signs: Temp: 98.4 F (36.9 C) (12/14 2028) Temp Source: Oral (12/14 2028) BP: 108/73 (12/14 2028) Pulse Rate: 95 (12/14 2028) Intake/Output from previous day: 12/13 0701 - 12/14 0700 In: 170 [P.O.:120; IV Piggyback:50] Out: 300 [Urine:300] Intake/Output from this shift: Total I/O In: 420 [Blood:420] Out: -   Labs: Recent Labs    08/07/17 1647 08/08/17 0445 08/09/17 0430 08/09/17 1146 08/09/17 2010  WBC 5.2 6.7 4.2  --   --   HGB 8.7* 7.5* 7.1*  --   --   HCT 26.8* 22.5* 21.1*  --   --   PLT 280 230 284  --   --   CREATININE 0.37* 0.49 0.34*  --   --   MG  --   --   --  1.5* 1.9  ALBUMIN 2.3*  --  1.8*  --   --   PROT 5.1*  --  4.5*  --   --   AST 106*  --  55*  --   --   ALT 20  --  26  --   --   ALKPHOS 209*  --  211*  --   --   BILITOT 1.5*  --  0.9  --   --   BILIDIR 0.8*  --   --   --   --   IBILI 0.7  --   --   --   --    Estimated Creatinine Clearance: 52.5 mL/min (A) (by C-G formula based on SCr of 0.34 mg/dL (L)).   Microbiology: No results found for this or any previous visit (from the past 720 hour(s)).  Medical History: Past Medical History:  Diagnosis Date  . Abdominal distention   . AKI (acute kidney injury) ([place]) 02/24/2017  . Anemia   . Arthritis   . Asthma   . Back pain, chronic 03/15/2017  . Breast cancer ([place]) 2009   RT MASTECTOMY, DCIS  . 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 Cancer of left breast ([place]) 08/09/2015   T4 N1 M1 tumor,  INVASIVE LOBULAR CARCINOMA.  . Carcinoma of overlapping sites of left breast in female, estrogen receptor positive ([place]) 03/22/2016  . COPD (chronic obstructive pulmonary disease) ([place])   . Diabetes ([place]) 02/24/2017  . Diabetes mellitus without complication ([place])   . Duodenal obstruction   . Encounter for nasogastric (NG) tube placement   . Gastric outlet obstruction 02/24/2017  . GERD (gastroesophageal reflux disease)   . Headache    h/o migraines as a child  . Hypertension   . Neck pain 04/19/2016  . Pancreatitis, acute 03/04/2017  . Pneumonia 2015  . Recurrent low back pain 03/15/2017  . Shortness of breath dyspnea    with exertion    Medications:  Scheduled:  . aspirin  81 mg Oral Daily  . atorvastatin  10 mg Oral Daily  . docusate sodium  100 mg Oral BID  . enoxaparin (LOVENOX) injection  40 mg Subcutaneous Q24H  . mometasone-formoterol  2 puff Inhalation BID  . montelukast  10 mg Oral QHS  . pantoprazole  40 mg Oral Daily  . tiotropium  18 mcg Inhalation BH-q7a    Assessment: 12/14 @ 20:30 :  K = 4.3,  Mag = 1.9  Goal of Therapy:  Normalization of electrolytes   Plan:  No electrolyte supplementation needed at this time, will recheck electrolytes on 12/15 with AM labs.   Dilpreet Faires D 08/09/2017,8:45 PM

## 2017-08-10 LAB — TYPE AND SCREEN
ABO/RH(D): O POS
Antibody Screen: NEGATIVE
UNIT DIVISION: 0

## 2017-08-10 LAB — BPAM RBC
Blood Product Expiration Date: 201812202359
ISSUE DATE / TIME: 201812141609
Unit Type and Rh: 9500

## 2017-08-16 ENCOUNTER — Other Ambulatory Visit: Payer: Self-pay

## 2017-08-16 ENCOUNTER — Inpatient Hospital Stay: Payer: Medicare HMO

## 2017-08-16 ENCOUNTER — Inpatient Hospital Stay (HOSPITAL_BASED_OUTPATIENT_CLINIC_OR_DEPARTMENT_OTHER): Payer: Medicare HMO | Admitting: Internal Medicine

## 2017-08-16 VITALS — BP 105/69 | HR 93 | Temp 98.2°F | Resp 16 | Wt 116.3 lb

## 2017-08-16 DIAGNOSIS — J449 Chronic obstructive pulmonary disease, unspecified: Secondary | ICD-10-CM | POA: Diagnosis not present

## 2017-08-16 DIAGNOSIS — Z7982 Long term (current) use of aspirin: Secondary | ICD-10-CM | POA: Diagnosis not present

## 2017-08-16 DIAGNOSIS — Z801 Family history of malignant neoplasm of trachea, bronchus and lung: Secondary | ICD-10-CM

## 2017-08-16 DIAGNOSIS — C7951 Secondary malignant neoplasm of bone: Secondary | ICD-10-CM

## 2017-08-16 DIAGNOSIS — E119 Type 2 diabetes mellitus without complications: Secondary | ICD-10-CM | POA: Diagnosis not present

## 2017-08-16 DIAGNOSIS — C772 Secondary and unspecified malignant neoplasm of intra-abdominal lymph nodes: Secondary | ICD-10-CM | POA: Diagnosis not present

## 2017-08-16 DIAGNOSIS — I700 Atherosclerosis of aorta: Secondary | ICD-10-CM | POA: Diagnosis not present

## 2017-08-16 DIAGNOSIS — R630 Anorexia: Secondary | ICD-10-CM | POA: Diagnosis not present

## 2017-08-16 DIAGNOSIS — Z90710 Acquired absence of both cervix and uterus: Secondary | ICD-10-CM

## 2017-08-16 DIAGNOSIS — Z5111 Encounter for antineoplastic chemotherapy: Secondary | ICD-10-CM | POA: Diagnosis present

## 2017-08-16 DIAGNOSIS — C50812 Malignant neoplasm of overlapping sites of left female breast: Secondary | ICD-10-CM

## 2017-08-16 DIAGNOSIS — Z87891 Personal history of nicotine dependence: Secondary | ICD-10-CM

## 2017-08-16 DIAGNOSIS — Z79899 Other long term (current) drug therapy: Secondary | ICD-10-CM

## 2017-08-16 DIAGNOSIS — I10 Essential (primary) hypertension: Secondary | ICD-10-CM

## 2017-08-16 DIAGNOSIS — Z79818 Long term (current) use of other agents affecting estrogen receptors and estrogen levels: Secondary | ICD-10-CM

## 2017-08-16 DIAGNOSIS — Z853 Personal history of malignant neoplasm of breast: Secondary | ICD-10-CM

## 2017-08-16 DIAGNOSIS — Z170 Estrogen receptor positive status [ER+]: Principal | ICD-10-CM

## 2017-08-16 DIAGNOSIS — K219 Gastro-esophageal reflux disease without esophagitis: Secondary | ICD-10-CM | POA: Diagnosis not present

## 2017-08-16 DIAGNOSIS — R634 Abnormal weight loss: Secondary | ICD-10-CM

## 2017-08-16 DIAGNOSIS — R112 Nausea with vomiting, unspecified: Secondary | ICD-10-CM | POA: Diagnosis not present

## 2017-08-16 DIAGNOSIS — G893 Neoplasm related pain (acute) (chronic): Secondary | ICD-10-CM

## 2017-08-16 DIAGNOSIS — Z9013 Acquired absence of bilateral breasts and nipples: Secondary | ICD-10-CM

## 2017-08-16 DIAGNOSIS — D649 Anemia, unspecified: Secondary | ICD-10-CM

## 2017-08-16 DIAGNOSIS — M1990 Unspecified osteoarthritis, unspecified site: Secondary | ICD-10-CM

## 2017-08-16 DIAGNOSIS — R109 Unspecified abdominal pain: Secondary | ICD-10-CM | POA: Diagnosis not present

## 2017-08-16 DIAGNOSIS — Z7189 Other specified counseling: Secondary | ICD-10-CM

## 2017-08-16 LAB — CBC WITH DIFFERENTIAL/PLATELET
BASOS ABS: 0.1 10*3/uL (ref 0–0.1)
Basophils Relative: 1 %
EOS ABS: 0.1 10*3/uL (ref 0–0.7)
EOS PCT: 2 %
HCT: 27.6 % — ABNORMAL LOW (ref 35.0–47.0)
HEMOGLOBIN: 9.2 g/dL — AB (ref 12.0–16.0)
LYMPHS ABS: 1.4 10*3/uL (ref 1.0–3.6)
LYMPHS PCT: 22 %
MCH: 29.9 pg (ref 26.0–34.0)
MCHC: 33.3 g/dL (ref 32.0–36.0)
MCV: 90 fL (ref 80.0–100.0)
Monocytes Absolute: 0.8 10*3/uL (ref 0.2–0.9)
Monocytes Relative: 13 %
NEUTROS PCT: 62 %
Neutro Abs: 4 10*3/uL (ref 1.4–6.5)
PLATELETS: 492 10*3/uL — AB (ref 150–440)
RBC: 3.07 MIL/uL — AB (ref 3.80–5.20)
RDW: 16.8 % — ABNORMAL HIGH (ref 11.5–14.5)
WBC: 6.4 10*3/uL (ref 3.6–11.0)

## 2017-08-16 LAB — COMPREHENSIVE METABOLIC PANEL
ALT: 9 U/L — ABNORMAL LOW (ref 14–54)
AST: 21 U/L (ref 15–41)
Albumin: 2.3 g/dL — ABNORMAL LOW (ref 3.5–5.0)
Alkaline Phosphatase: 127 U/L — ABNORMAL HIGH (ref 38–126)
Anion gap: 5 (ref 5–15)
BILIRUBIN TOTAL: 0.4 mg/dL (ref 0.3–1.2)
BUN: 13 mg/dL (ref 6–20)
CHLORIDE: 108 mmol/L (ref 101–111)
CO2: 25 mmol/L (ref 22–32)
Calcium: 8.3 mg/dL — ABNORMAL LOW (ref 8.9–10.3)
Creatinine, Ser: 0.48 mg/dL (ref 0.44–1.00)
Glucose, Bld: 150 mg/dL — ABNORMAL HIGH (ref 65–99)
POTASSIUM: 3.6 mmol/L (ref 3.5–5.1)
Sodium: 138 mmol/L (ref 135–145)
TOTAL PROTEIN: 5.2 g/dL — AB (ref 6.5–8.1)

## 2017-08-16 LAB — SAMPLE TO BLOOD BANK

## 2017-08-16 MED ORDER — SODIUM CHLORIDE 0.9 % IV SOLN
Freq: Once | INTRAVENOUS | Status: AC
  Administered 2017-08-16: 10:00:00 via INTRAVENOUS
  Filled 2017-08-16: qty 1000

## 2017-08-16 MED ORDER — DIPHENHYDRAMINE HCL 50 MG/ML IJ SOLN
50.0000 mg | Freq: Once | INTRAMUSCULAR | Status: AC
  Administered 2017-08-16: 50 mg via INTRAVENOUS
  Filled 2017-08-16: qty 1

## 2017-08-16 MED ORDER — FAMOTIDINE IN NACL 20-0.9 MG/50ML-% IV SOLN
20.0000 mg | Freq: Once | INTRAVENOUS | Status: AC
  Administered 2017-08-16: 20 mg via INTRAVENOUS
  Filled 2017-08-16: qty 50

## 2017-08-16 MED ORDER — DEXAMETHASONE SODIUM PHOSPHATE 10 MG/ML IJ SOLN
10.0000 mg | Freq: Once | INTRAMUSCULAR | Status: AC
  Administered 2017-08-16: 10 mg via INTRAVENOUS
  Filled 2017-08-16: qty 1

## 2017-08-16 MED ORDER — SODIUM CHLORIDE 0.9 % IV SOLN: 10.0000 mg | Freq: Once | INTRAVENOUS | Status: DC

## 2017-08-16 MED ORDER — PACLITAXEL CHEMO INJECTION 300 MG/50ML
80.0000 mg/m2 | Freq: Once | INTRAVENOUS | Status: AC
  Administered 2017-08-16: 120 mg via INTRAVENOUS
  Filled 2017-08-16: qty 20

## 2017-08-16 MED ORDER — HEPARIN SOD (PORK) LOCK FLUSH 100 UNIT/ML IV SOLN
500.0000 [IU] | Freq: Once | INTRAVENOUS | Status: AC | PRN
  Administered 2017-08-16: 500 [IU]

## 2017-08-16 NOTE — Assessment & Plan Note (Addendum)
#  Metastatic breast cancer/lobular ER/PR positive HER-2/neu negative-July-AUG 2018- progression in the bone L3; also abdomen causing extrinsic compression of the small bowel [status post resection-C discussion below]. On Taxol weekly.  # proceed with cycle #2; Labs today reviewed;  acceptable for treatment today- except for- [see below]-anemia hemoglobin 9.2  # Profound weight loss weight loss- sec to cancer- improving.   # Recent abdominal pain question acute cholecystitis-based on imaging; not a surgical candidate.  LFTs normal.  Finish up antibiotics.  # Lytic lesion at L3- positive on PET scan. Lumbar spine shows L3 lesion- on RT [last treatment on 8/27]; no clinical evidence of progression.  We will need follow-up imaging.  # Bone metastases-s/p X-geva; HOLD [because of severe hypocalcemia/multiple electrolyte abnormalities]   # Neck pain/bone pain from malignancy- continue oxycodone.   # Hypocalcemia- Ca- 8.3; HOLD x-geva; continue vit D-  Improving.  #  Anemia-multifactorial hemoglobin - improving Hb  9.2-monitor for now.    # weekly cbc/Taxol-1 w; in 3 weeks/MD/labs-taxol.

## 2017-08-16 NOTE — Progress Notes (Signed)
[place] OFFICE PROGRESS NOTE  Patient Care Team: Donnie Coffin, MD as PCP - General (Family Medicine) Donnie Coffin, MD as Referring Physician (Family Medicine) Christene Lye, MD (General Surgery) Forest Gleason, MD (Oncology) [name], MD as Consulting Physician (Internal Medicine)  Cancer of left breast [facility])   Staging form: Breast, AJCC 7th Edition     Clinical: Stage IIIB (T4, N1, cM0(i+)) - Signed by Forest Gleason, MD on 08/09/2015     Pathologic: Stage IV (T4, N1, M1) - Signed by Forest Gleason, MD on 08/16/2015    Oncology History   # DEC 2016-  LEFT BREAST CA- LOBULAR CA ER/PR-Pos; her 2 NEG STAGE IV [[id number]- left breast/bil Ax LN/Media LN/RP LN; skeletal mets]; DEC 2016- LETROZOLE + IBRANCE; April 2017- Significant response to treatment- [Dr.Sankar]; s/p Left mastec & ALND [palliative]; cont Ibrance + Letrzole. [facility] Pat[facility] 2018/sec to Anemia]  # July 2018- PROGRESSION [PET scan-L1 lytic lesion; Small bowel extrinsic compression s/p lap resection + Lobular cancer; Dr.Davis ]  # AUG 9th th-FASLODEX; SEP 1 week- Verzinio 150 BID;STOP clinical progression [DEC 2018 CT- no obvious mass noted]  # Dec 7th Taxol weekly;  # AUG 2018- s/p RT to L3 lytic lesion  # RIGHT BREAST CA [s/p mastectomy [facility]]  -------------------------------------------------    1.) Retrocolic retrogastric Roux-en-Y (gastrojejunostomy) bypass of distal duodenal obstruction (cpt: 43820) 2.) Segmental jejunal small bowel resection for accessible tumor pathology (cpt: 49449)  INTRAOPERATIVE FINDINGS: Large firm irregular mass encasing duodenum at approximately the ligament of Treitz with diffuse tumor implant studding along much of small intestinal omentum and serosa  # [facility]- ordered 12th Dec       Carcinoma of overlapping sites of left breast in female, estrogen receptor positive ([place])    INTERVAL HISTORY:  Sophia Nelson 71 y.o.  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  Patient cycle #2 of Taxol was interrupted-because patient was admitted to the hospital for nausea vomiting abdominal pain.  Imaging/CT scan concerning for acute cholecystitis.  But with normal LFTs.  Symptoms-improved with conservative measures.  Patient thought to be high risk for surgery given her high tumor burden.  Overall she feels better since discharge from the hospital. Denies any worsening shortness of breath with exertion and cough. Denies any fevers or chills.  Denies any constipation or diarrhea.  REVIEW OF SYSTEMS:  A complete 10 point review of system is done which is negative except mentioned above/history of present illness.   PAST MEDICAL HISTORY :  Past Medical History:  Diagnosis Date  . Abdominal distention   . AKI (acute kidney injury) ([place]) 02/24/2017  . Anemia   . Arthritis   . Asthma   . Back pain, chronic 03/15/2017  . Breast cancer ([place]) 2009   RT MASTECTOMY, DCIS  . 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 Cancer of left breast ([place]) 08/09/2015   T4 N1 M1 tumor, INVASIVE LOBULAR CARCINOMA.  . Carcinoma of overlapping sites of left breast in female, estrogen receptor positive ([place]) 03/22/2016  . COPD (chronic obstructive pulmonary disease) ([place])   . Diabetes ([place]) 02/24/2017  . Diabetes mellitus without complication ([place])   . Duodenal obstruction   . Encounter for nasogastric (NG) tube placement   . Gastric outlet obstruction  02/24/2017  . GERD (gastroesophageal reflux disease)   . Headache    h/o migraines as a child  . Hypertension   . Neck pain 04/19/2016  . Pancreatitis, acute 03/04/2017  . Pneumonia 2015  . Recurrent low back pain 03/15/2017  . Shortness of breath dyspnea    with exertion    PAST  SURGICAL HISTORY :   Past Surgical History:  Procedure Laterality Date  . ABDOMINAL HYSTERECTOMY    . BREAST SURGERY Right 2015   mastectomy  . ESOPHAGOGASTRODUODENOSCOPY N/A 02/25/2017   Procedure: ESOPHAGOGASTRODUODENOSCOPY (EGD);  Surgeon: Jonathon Bellows, MD;  Location: [facility] ENDOSCOPY;  Service: Endoscopy;  Laterality: N/A;  . ESOPHAGOGASTRODUODENOSCOPY (EGD) WITH PROPOFOL N/A 03/06/2017   Procedure: ESOPHAGOGASTRODUODENOSCOPY (EGD) WITH PROPOFOL;  Surgeon: [name], MD;  Location: [facility] ENDOSCOPY;  Service: Endoscopy;  Laterality: N/A;  . GASTROJEJUNOSTOMY N/A 03/08/2017   Procedure: Roux-en-Y gastrojejunostomy Bypass of Gastric Outlet Obstruction, small bowel resection;  Surgeon: Vickie Epley, MD;  Location: [facility] ORS;  Service: General;  Laterality: N/A;  . PORTACATH PLACEMENT Right 08/01/2017   Procedure: INSERTION PORT-A-CATH;  Surgeon: Christene Lye, MD;  Location: [facility] ORS;  Service: General;  Laterality: Right;  . SIMPLE MASTECTOMY WITH AXILLARY SENTINEL NODE BIOPSY Left 02/29/2016   Procedure: SIMPLE MASTECTOMY;  Surgeon: Christene Lye, MD;  Location: [facility] ORS;  Service: General;  Laterality: Left;  . TUBAL LIGATION      FAMILY HISTORY :   Family History  Problem Relation Age of Onset  . Lung cancer Father   . Cancer Maternal Aunt   . Breast cancer Neg Hx     SOCIAL HISTORY:   Social History   Tobacco Use  . Smoking status: Former Smoker    Packs/day: 0.25    Years: 15.00    Pack years: 3.75    Types: Cigarettes    Last attempt to quit: 07/23/2017    Years since quitting: 0.0  . Smokeless tobacco: Never Used  . Tobacco comment: currently as of 02-21-16 pt states she is only smoking 2-3 cigarettes per day  Substance Use Topics  . 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 Drug use: No    ALLERGIES:  is allergic to  other.  MEDICATIONS:  Current Outpatient Medications  Medication Sig Dispense Refill  . acetaminophen (TYLENOL) 325 MG tablet Take 1 tablet (325 mg total) by mouth every 6 (six) hours as needed for mild pain (or Fever >/= 101).    . ADVAIR DISKUS 500-50 MCG/DOSE AEPB Inhale 1 puff into the lungs 2 (two) times daily.     [name] albuterol (PROVENTIL HFA;VENTOLIN HFA) 108 (90 Base) MCG/ACT inhaler Inhale 2 puffs into the lungs every 6 (six) hours as needed for wheezing or shortness of breath.    [name] amoxicillin-clavulanate (AUGMENTIN) 875-125 MG tablet Take 1 tablet by mouth 2 (two) times daily for 14 days. 28 tablet 0  . aspirin 81 MG tablet Take 81 mg by mouth daily.    [name] atorvastatin (LIPITOR) 10 MG tablet Take 10 mg by mouth daily.    [name] docusate sodium (COLACE) 100 MG capsule Take 100 mg by mouth 2 (two) times daily.    . ergocalciferol (VITAMIN D2) 50000 units capsule Take 1 capsule (50,000 Units total) by mouth once a week. (Patient taking differently: Take 50,000 Units by mouth every Monday. )  12 capsule 1  . feeding supplement, ENSURE ENLIVE, (ENSURE ENLIVE) LIQD Take 237 mLs by mouth 3 (three) times daily between meals. 237 mL 12  . fluticasone (FLONASE) 50 MCG/ACT nasal spray Place 2 sprays into both nostrils daily.    [name] guaiFENesin (MUCINEX) 600 MG 12 hr tablet Take 1 tablet (600 mg total) by mouth 2 (two) times daily as needed for cough or to loosen phlegm.    . lidocaine-prilocaine (EMLA) cream Apply 1 application topically as needed (for port access).     [name] loperamide (IMODIUM) 2 MG capsule One pill after each loose stool; maximum upto 8 pills a day. (Patient taking differently: Take 2 mg by mouth as needed for diarrhea or loose stools (Up to 8 pills (16 mg) a day). ) 40 capsule 0  . loratadine (CLARITIN) 10 MG tablet Take 10 mg by mouth daily.    . Magnesium Cl-Calcium Carbonate (SLOW MAGNESIUM/CALCIUM) 70-117 MG TBEC Take 1 tablet by mouth 2 (two) times daily. 60 tablet 6  . magnesium  hydroxide (MILK OF MAGNESIA) 400 MG/5ML suspension Take 30-45 mLs by mouth daily as needed for mild constipation.    . metoCLOPramide (REGLAN) 10 MG tablet Take 1 tablet (10 mg total) by mouth 4 (four) times daily. 120 tablet 3  . montelukast (SINGULAIR) 10 MG tablet Take 10 mg by mouth at bedtime.    [name] omeprazole (PRILOSEC) 20 MG capsule Take 20 mg by mouth every morning.     . ondansetron (ZOFRAN ODT) 8 MG disintegrating tablet Take 1 tablet (8 mg total) by mouth every 8 (eight) hours as needed for nausea or vomiting. 20 tablet 3  . oxyCODONE-acetaminophen (PERCOCET/ROXICET) 5-325 MG tablet Take 1 tablet by mouth every 8 (eight) hours as needed for severe pain. 90 tablet 0  . OXYGEN Inhale 2 L into the lungs continuous.     . prochlorperazine (COMPAZINE) 10 MG tablet Take 1 tablet (10 mg total) by mouth every 6 (six) hours as needed for nausea or vomiting. 30 tablet 3  . senna-docusate (SENOKOT-S) 8.6-50 MG tablet Take 1 tablet by mouth at bedtime as needed for mild constipation.    [name] SPIRIVA HANDIHALER 18 MCG inhalation capsule Place 18 mcg into inhaler and inhale every morning.      No current facility-administered medications for this visit.     PHYSICAL EXAMINATION: ECOG PERFORMANCE STATUS: 0 - Asymptomatic  BP 105/69 (BP Location: Left Arm, Patient Position: Sitting)   Pulse 93   Temp 98.2 F (36.8 C) (Tympanic)   Resp 16   Wt 116 lb 4.8 oz (52.8 kg)   BMI 21.27 kg/m   Filed Weights   08/16/17 0924  Weight: 116 lb 4.8 oz (52.8 kg)    GENERAL: Moderately nourished well-developed; Alert, no distress and comfortable. She is in a wheelchair. EYES: no pallor or icterus OROPHARYNX: no thrush or ulceration; poor dentition.   NECK: supple, no masses felt LYMPH:  no palpable lymphadenopathy in the cervical, axillary or inguinal regions LUNGS: clear to auscultation and  No wheeze or crackles HEART/CVS: regular rate & rhythm and no murmurs; No lower extremity edema ABDOMEN:abdomen  soft, non-tender and normal bowel sounds Musculoskeletal:no cyanosis of digits and no clubbing;  PSYCH: alert & oriented x 3 with fluent speech NEURO: no focal motor/sensory deficits SKIN:  no rashes or significant lesions   LABORATORY DATA:  I have reviewed the data as listed    Component Value Date/Time   NA 138 08/16/2017 0905   NA  133 (L) 04/08/2014 1738   K 3.6 08/16/2017 0905   K 4.2 04/08/2014 1738   CL 108 08/16/2017 0905   CL 99 04/08/2014 1738   CO2 25 08/16/2017 0905   CO2 28 04/08/2014 1738   GLUCOSE 150 (H) 08/16/2017 0905   GLUCOSE 116 (H) 04/08/2014 1738   BUN 13 08/16/2017 0905   BUN 5 (L) 04/08/2014 1738   CREATININE 0.48 08/16/2017 0905   CREATININE 0.42 (L) 04/08/2014 1738   CALCIUM 8.3 (L) 08/16/2017 0905   CALCIUM 4.9 06/09/2017 1441   PROT 5.2 (L) 08/16/2017 0905   ALBUMIN 2.3 (L) 08/16/2017 0905   AST 21 08/16/2017 0905   ALT 9 (L) 08/16/2017 0905   ALKPHOS 127 (H) 08/16/2017 0905   BILITOT 0.4 08/16/2017 0905   GFRNONAA >60 08/16/2017 0905   GFRNONAA >60 04/08/2014 1738   GFRAA >60 08/16/2017 0905   GFRAA >60 04/08/2014 1738    No results found for: SPEP, UPEP  Lab Results  Component Value Date   WBC 6.4 08/16/2017   NEUTROABS 4.0 08/16/2017   HGB 9.2 (L) 08/16/2017   HCT 27.6 (L) 08/16/2017   MCV 90.0 08/16/2017   PLT 492 (H) 08/16/2017      Chemistry      Component Value Date/Time   NA 138 08/16/2017 0905   NA 133 (L) 04/08/2014 1738   K 3.6 08/16/2017 0905   K 4.2 04/08/2014 1738   CL 108 08/16/2017 0905   CL 99 04/08/2014 1738   CO2 25 08/16/2017 0905   CO2 28 04/08/2014 1738   BUN 13 08/16/2017 0905   BUN 5 (L) 04/08/2014 1738   CREATININE 0.48 08/16/2017 0905   CREATININE 0.42 (L) 04/08/2014 1738      Component Value Date/Time   CALCIUM 8.3 (L) 08/16/2017 0905   CALCIUM 4.9 06/09/2017 1441   ALKPHOS 127 (H) 08/16/2017 0905   AST 21 08/16/2017 0905   ALT 9 (L) 08/16/2017 0905   BILITOT 0.4 08/16/2017 0905        RADIOGRAPHIC STUDIES: I have personally reviewed the radiological images as listed and agreed with the findings in the report. No results found.   ASSESSMENT & PLAN:  Carcinoma of overlapping sites of left breast in female, estrogen receptor positive ([place]) # Metastatic breast cancer/lobular ER/PR positive HER-2/neu negative-July-AUG 2018- progression in the bone L3; also abdomen causing extrinsic compression of the small bowel [status post resection-C discussion below]. On Taxol weekly.  # proceed with cycle #2; Labs today reviewed;  acceptable for treatment today- except for- [see below]-anemia hemoglobin 9.2  # Profound weight loss weight loss- sec to cancer- improving.   # Recent abdominal pain question acute cholecystitis-based on imaging; not a surgical candidate.  LFTs normal.  Finish up antibiotics.  # Lytic lesion at L3- positive on PET scan. Lumbar spine shows L3 lesion- on RT [last treatment on 8/27]; no clinical evidence of progression.  We will need follow-up imaging.  # Bone metastases-s/p X-geva; HOLD [because of severe hypocalcemia/multiple electrolyte abnormalities]   # Neck pain/bone pain from malignancy- continue oxycodone.   # Hypocalcemia- Ca- 8.3; HOLD x-geva; continue vit D-  Improving.  #  Anemia-multifactorial hemoglobin - improving Hb  9.2-monitor for now.    # weekly cbc/Taxol-1 w; in 3 weeks/MD/labs-taxol.     Orders Placed This Encounter  Procedures  . CBC with Differential    Standing Status:   Future    Standing Expiration Date:   08/16/2018  . Comprehensive metabolic panel  Standing Status:   Future    Standing Expiration Date:   08/16/2018  . CBC with Differential    Standing Status:   Future    Standing Expiration Date:   08/16/2018       [name], MD 08/16/2017 1:03 PM

## 2017-08-23 ENCOUNTER — Inpatient Hospital Stay: Payer: Medicare HMO

## 2017-08-23 VITALS — BP 117/74 | HR 96 | Temp 98.4°F | Resp 20 | Wt 112.4 lb

## 2017-08-23 DIAGNOSIS — Z170 Estrogen receptor positive status [ER+]: Principal | ICD-10-CM

## 2017-08-23 DIAGNOSIS — Z5111 Encounter for antineoplastic chemotherapy: Secondary | ICD-10-CM | POA: Diagnosis not present

## 2017-08-23 DIAGNOSIS — Z7189 Other specified counseling: Secondary | ICD-10-CM

## 2017-08-23 DIAGNOSIS — C50812 Malignant neoplasm of overlapping sites of left female breast: Secondary | ICD-10-CM

## 2017-08-23 LAB — CBC WITH DIFFERENTIAL/PLATELET
BASOS ABS: 0.1 10*3/uL (ref 0–0.1)
BASOS PCT: 1 %
Eosinophils Absolute: 0.1 10*3/uL (ref 0–0.7)
Eosinophils Relative: 2 %
HEMATOCRIT: 27.5 % — AB (ref 35.0–47.0)
HEMOGLOBIN: 9.2 g/dL — AB (ref 12.0–16.0)
LYMPHS PCT: 21 %
Lymphs Abs: 1.4 10*3/uL (ref 1.0–3.6)
MCH: 29.9 pg (ref 26.0–34.0)
MCHC: 33.7 g/dL (ref 32.0–36.0)
MCV: 88.7 fL (ref 80.0–100.0)
MONO ABS: 0.5 10*3/uL (ref 0.2–0.9)
Monocytes Relative: 8 %
NEUTROS ABS: 4.6 10*3/uL (ref 1.4–6.5)
NEUTROS PCT: 68 %
Platelets: 416 10*3/uL (ref 150–440)
RBC: 3.09 MIL/uL — AB (ref 3.80–5.20)
RDW: 16.4 % — AB (ref 11.5–14.5)
WBC: 6.8 10*3/uL (ref 3.6–11.0)

## 2017-08-23 LAB — COMPREHENSIVE METABOLIC PANEL
ALBUMIN: 2.4 g/dL — AB (ref 3.5–5.0)
ALK PHOS: 88 U/L (ref 38–126)
ALT: 8 U/L — AB (ref 14–54)
AST: 19 U/L (ref 15–41)
Anion gap: 8 (ref 5–15)
BILIRUBIN TOTAL: 0.8 mg/dL (ref 0.3–1.2)
BUN: 12 mg/dL (ref 6–20)
CALCIUM: 8.2 mg/dL — AB (ref 8.9–10.3)
CO2: 24 mmol/L (ref 22–32)
CREATININE: 0.44 mg/dL (ref 0.44–1.00)
Chloride: 104 mmol/L (ref 101–111)
GFR calc Af Amer: >60 mL/min (ref >60)
GLUCOSE: 108 mg/dL — AB (ref 65–99)
POTASSIUM: 3.5 mmol/L (ref 3.5–5.1)
Sodium: 136 mmol/L (ref 135–145)
TOTAL PROTEIN: 5.7 g/dL — AB (ref 6.5–8.1)

## 2017-08-23 MED ORDER — FAMOTIDINE IN NACL 20-0.9 MG/50ML-% IV SOLN
20.0000 mg | Freq: Once | INTRAVENOUS | Status: AC
  Administered 2017-08-23: 20 mg via INTRAVENOUS
  Filled 2017-08-23: qty 50

## 2017-08-23 MED ORDER — DEXAMETHASONE SODIUM PHOSPHATE 100 MG/10ML IJ SOLN: 10.0000 mg | Freq: Once | INTRAMUSCULAR | Status: DC

## 2017-08-23 MED ORDER — SODIUM CHLORIDE 0.9% FLUSH
10.0000 mL | Freq: Once | INTRAVENOUS | Status: AC
  Administered 2017-08-23: 10 mL via INTRAVENOUS
  Filled 2017-08-23: qty 10

## 2017-08-23 MED ORDER — PACLITAXEL CHEMO INJECTION 300 MG/50ML
80.0000 mg/m2 | Freq: Once | INTRAVENOUS | Status: AC
  Administered 2017-08-23: 120 mg via INTRAVENOUS
  Filled 2017-08-23: qty 20

## 2017-08-23 MED ORDER — DIPHENHYDRAMINE HCL 50 MG/ML IJ SOLN
50.0000 mg | Freq: Once | INTRAMUSCULAR | Status: AC
Start: 2017-08-23 — End: 2017-08-23
  Administered 2017-08-23: 50 mg via INTRAVENOUS
  Filled 2017-08-23: qty 1

## 2017-08-23 MED ORDER — SODIUM CHLORIDE 0.9 % IV SOLN
Freq: Once | INTRAVENOUS | Status: AC
  Administered 2017-08-23: 10:00:00 via INTRAVENOUS
  Filled 2017-08-23: qty 1000

## 2017-08-23 MED ORDER — DEXAMETHASONE SODIUM PHOSPHATE 10 MG/ML IJ SOLN
10.0000 mg | Freq: Once | INTRAMUSCULAR | Status: AC
  Administered 2017-08-23: 10 mg via INTRAVENOUS
  Filled 2017-08-23: qty 1

## 2017-08-23 MED ORDER — HEPARIN SOD (PORK) LOCK FLUSH 100 UNIT/ML IV SOLN
500.0000 [IU] | Freq: Once | INTRAVENOUS | Status: AC
  Administered 2017-08-23: 500 [IU] via INTRAVENOUS
  Filled 2017-08-23: qty 5

## 2017-08-28 LAB — SURGICAL PATHOLOGY

## 2017-08-30 ENCOUNTER — Encounter: Payer: Self-pay | Admitting: Internal Medicine

## 2017-09-05 ENCOUNTER — Other Ambulatory Visit: Payer: Self-pay | Admitting: *Deleted

## 2017-09-05 ENCOUNTER — Inpatient Hospital Stay: Payer: Medicare HMO | Attending: Internal Medicine

## 2017-09-05 ENCOUNTER — Telehealth: Payer: Self-pay

## 2017-09-05 DIAGNOSIS — C50812 Malignant neoplasm of overlapping sites of left female breast: Secondary | ICD-10-CM

## 2017-09-05 DIAGNOSIS — C7951 Secondary malignant neoplasm of bone: Secondary | ICD-10-CM | POA: Insufficient documentation

## 2017-09-05 DIAGNOSIS — Z5111 Encounter for antineoplastic chemotherapy: Secondary | ICD-10-CM | POA: Insufficient documentation

## 2017-09-05 DIAGNOSIS — Z170 Estrogen receptor positive status [ER+]: Principal | ICD-10-CM

## 2017-09-05 NOTE — Progress Notes (Signed)
Nutrition Follow-up:  Patient with metastatic breast cancer with recurrence in abdomen causing compression of small bowel and bone mets.  S/p roux-en-y gastrojejunostomy.  Noted recent hospitalization for cholecystitis, not a surgical candidate.    Called patient for nutrition follow-up.  Patient reports appetite is good and that she is eating well.  Reports that she eats whatever she wants.  Likes bacon, eggs for breakfast, jello and soup for lunch and supper meat (pork chop, fish, hamburger and potatoes, sometimes will have spaghetti.  Reports that she is drinking ensure plus daily but does not like it.  Reports no diarrhea or nausea or constipation.     Medications: reglan, senokot, compazine, zofran, Vit D  Labs: reviewed  Anthropometrics:   Weight decreased to 112 lb 6.4 oz on 12/28 from 136 lb on 10/4 at initial nutrition visit.     NUTRITION DIAGNOSIS: Inadequate food and beverage intake continues   MALNUTRITION DIAGNOSIS: severe malnutrition continues   INTERVENTION:   Reviewed ways to increase calories and protein with current meal pattern.   Encouraged patient to continue ensure plus for added calories and protein Patient only able to come into clinic on days when RD is not at this site.   Contact information previously provided and patient knows to contact me with questions    MONITORING, EVALUATION, GOAL: weight trends, intake   NEXT VISIT: as needed, patient unable to come to clinic on days when RD is at facility  [place]. Zenia Resides, [place], [place] Registered Dietitian [phone] (pager)

## 2017-09-05 NOTE — Telephone Encounter (Signed)
Nutrition  Called patient but no answer and voice mail has not been set up.  Will follow-up as able.  Natoshia Souter B. Zenia Resides, [place], [place] Registered Dietitian [phone] (pager)

## 2017-09-06 ENCOUNTER — Inpatient Hospital Stay (HOSPITAL_BASED_OUTPATIENT_CLINIC_OR_DEPARTMENT_OTHER): Payer: Medicare HMO | Admitting: Internal Medicine

## 2017-09-06 ENCOUNTER — Inpatient Hospital Stay: Payer: Medicare HMO

## 2017-09-06 ENCOUNTER — Encounter: Payer: Self-pay | Admitting: Internal Medicine

## 2017-09-06 ENCOUNTER — Other Ambulatory Visit: Payer: Self-pay

## 2017-09-06 VITALS — BP 100/66 | HR 92 | Resp 16 | Wt 115.0 lb

## 2017-09-06 DIAGNOSIS — Z170 Estrogen receptor positive status [ER+]: Principal | ICD-10-CM

## 2017-09-06 DIAGNOSIS — Z7189 Other specified counseling: Secondary | ICD-10-CM

## 2017-09-06 DIAGNOSIS — G893 Neoplasm related pain (acute) (chronic): Secondary | ICD-10-CM | POA: Diagnosis not present

## 2017-09-06 DIAGNOSIS — C50812 Malignant neoplasm of overlapping sites of left female breast: Secondary | ICD-10-CM

## 2017-09-06 DIAGNOSIS — D649 Anemia, unspecified: Secondary | ICD-10-CM

## 2017-09-06 DIAGNOSIS — C7951 Secondary malignant neoplasm of bone: Secondary | ICD-10-CM | POA: Diagnosis not present

## 2017-09-06 DIAGNOSIS — Z5111 Encounter for antineoplastic chemotherapy: Secondary | ICD-10-CM | POA: Diagnosis not present

## 2017-09-06 LAB — COMPREHENSIVE METABOLIC PANEL
ALT: 8 U/L — AB (ref 14–54)
ANION GAP: 7 (ref 5–15)
AST: 27 U/L (ref 15–41)
Albumin: 2.1 g/dL — ABNORMAL LOW (ref 3.5–5.0)
Alkaline Phosphatase: 80 U/L (ref 38–126)
BUN: 12 mg/dL (ref 6–20)
CHLORIDE: 104 mmol/L (ref 101–111)
CO2: 27 mmol/L (ref 22–32)
Calcium: 8.3 mg/dL — ABNORMAL LOW (ref 8.9–10.3)
Creatinine, Ser: 0.33 mg/dL — ABNORMAL LOW (ref 0.44–1.00)
GFR calc non Af Amer: >60 mL/min (ref >60)
Glucose, Bld: 181 mg/dL — ABNORMAL HIGH (ref 65–99)
Potassium: 2.4 mmol/L — CL (ref 3.5–5.1)
SODIUM: 138 mmol/L (ref 135–145)
Total Bilirubin: 0.5 mg/dL (ref 0.3–1.2)
Total Protein: 4.9 g/dL — ABNORMAL LOW (ref 6.5–8.1)

## 2017-09-06 LAB — CBC WITH DIFFERENTIAL/PLATELET
Basophils Absolute: 0.1 10*3/uL (ref 0–0.1)
Basophils Relative: 1 %
Eosinophils Absolute: 0.2 10*3/uL (ref 0–0.7)
Eosinophils Relative: 2 %
HEMATOCRIT: 25.8 % — AB (ref 35.0–47.0)
HEMOGLOBIN: 8.5 g/dL — AB (ref 12.0–16.0)
LYMPHS ABS: 1.3 10*3/uL (ref 1.0–3.6)
LYMPHS PCT: 13 %
MCH: 29.3 pg (ref 26.0–34.0)
MCHC: 33 g/dL (ref 32.0–36.0)
MCV: 88.7 fL (ref 80.0–100.0)
Monocytes Absolute: 0.9 10*3/uL (ref 0.2–0.9)
Monocytes Relative: 9 %
NEUTROS ABS: 7.4 10*3/uL — AB (ref 1.4–6.5)
NEUTROS PCT: 75 %
Platelets: 379 10*3/uL (ref 150–440)
RBC: 2.91 MIL/uL — ABNORMAL LOW (ref 3.80–5.20)
RDW: 16.9 % — ABNORMAL HIGH (ref 11.5–14.5)
WBC: 9.9 10*3/uL (ref 3.6–11.0)

## 2017-09-06 LAB — SAMPLE TO BLOOD BANK

## 2017-09-06 MED ORDER — DEXAMETHASONE SODIUM PHOSPHATE 100 MG/10ML IJ SOLN: 10.0000 mg | Freq: Once | INTRAMUSCULAR | Status: DC

## 2017-09-06 MED ORDER — OXYCODONE-ACETAMINOPHEN 5-325 MG PO TABS: 1.0000 | ORAL_TABLET | Freq: Three times a day (TID) | ORAL | 0 refills | Status: DC | PRN

## 2017-09-06 MED ORDER — SODIUM CHLORIDE 0.9 % IV SOLN
Freq: Once | INTRAVENOUS | Status: AC
  Administered 2017-09-06: 11:00:00 via INTRAVENOUS
  Filled 2017-09-06: qty 1000

## 2017-09-06 MED ORDER — FULVESTRANT 250 MG/5ML IM SOLN: 500.0000 mg | Freq: Once | INTRAMUSCULAR | Status: DC

## 2017-09-06 MED ORDER — POTASSIUM CHLORIDE CRYS ER 20 MEQ PO TBCR: EXTENDED_RELEASE_TABLET | ORAL | 3 refills | Status: DC

## 2017-09-06 MED ORDER — PACLITAXEL CHEMO INJECTION 300 MG/50ML
80.0000 mg/m2 | Freq: Once | INTRAVENOUS | Status: AC
  Administered 2017-09-06: 120 mg via INTRAVENOUS
  Filled 2017-09-06: qty 20

## 2017-09-06 MED ORDER — HEPARIN SOD (PORK) LOCK FLUSH 100 UNIT/ML IV SOLN
500.0000 [IU] | Freq: Once | INTRAVENOUS | Status: AC | PRN
  Administered 2017-09-06: 500 [IU]
  Filled 2017-09-06 (×2): qty 5

## 2017-09-06 MED ORDER — DEXAMETHASONE SODIUM PHOSPHATE 10 MG/ML IJ SOLN
10.0000 mg | Freq: Once | INTRAMUSCULAR | Status: AC
  Administered 2017-09-06: 10 mg via INTRAVENOUS
  Filled 2017-09-06: qty 1

## 2017-09-06 MED ORDER — SODIUM CHLORIDE 0.9% FLUSH
10.0000 mL | INTRAVENOUS | Status: DC | PRN
  Filled 2017-09-06: qty 10

## 2017-09-06 MED ORDER — SODIUM CHLORIDE 0.9 % IV SOLN
Freq: Once | INTRAVENOUS | Status: AC
  Administered 2017-09-06: 13:00:00 via INTRAVENOUS
  Filled 2017-09-06: qty 20

## 2017-09-06 MED ORDER — FAMOTIDINE IN NACL 20-0.9 MG/50ML-% IV SOLN
20.0000 mg | Freq: Once | INTRAVENOUS | Status: AC
  Administered 2017-09-06: 20 mg via INTRAVENOUS
  Filled 2017-09-06: qty 50

## 2017-09-06 MED ORDER — DIPHENHYDRAMINE HCL 50 MG/ML IJ SOLN
50.0000 mg | Freq: Once | INTRAMUSCULAR | Status: AC
  Administered 2017-09-06: 50 mg via INTRAVENOUS
  Filled 2017-09-06: qty 1

## 2017-09-06 NOTE — Patient Instructions (Signed)
  Your potassium level is low today. We will administer potassium in your IV line today, but Dr. Rogue Bussing has sent a prescription for oral potassium to your [facility].  Please pick up this medication today and take your dose tonight.  You will take potassium 20 MEQ (1 pill twice a day) with a full glass of water    Hypokalemia Hypokalemia means that the amount of potassium in the blood is lower than normal.Potassium is a chemical that helps regulate the amount of fluid in the body (electrolyte). It also stimulates muscle tightening (contraction) and helps nerves work properly.Normally, most of the body's potassium is inside of cells, and only a very small amount is in the blood. Because the amount in the blood is so small, minor changes to potassium levels in the blood can be life-threatening. What are the causes? This condition may be caused by:  Antibiotic medicine.  Diarrhea or vomiting. Taking too much of a medicine that helps you have a bowel movement (laxative) can cause diarrhea and lead to hypokalemia.  Chronic kidney disease (CKD).  Medicines that help the body get rid of excess fluid (diuretics).  Eating disorders, such as bulimia.  Low magnesium levels in the body.  Sweating a lot.  What are the signs or symptoms? Symptoms of this condition include:  Weakness.  Constipation.  Fatigue.  Muscle cramps.  Mental confusion.  Skipped heartbeats or irregular heartbeat (palpitations).  Tingling or numbness.  How is this diagnosed? This condition is diagnosed with a blood test. How is this treated? Hypokalemia can be treated by taking potassium supplements by mouth or adjusting the medicines that you take. Treatment may also include eating more foods that contain a lot of potassium. If your potassium level is very low, you may need to get potassium through an IV tube in one of your veins and be monitored in the hospital. Follow these instructions at  home:  Take over-the-counter and prescription medicines only as told by your health care provider. This includes vitamins and supplements.  Eat a healthy diet. A healthy diet includes fresh fruits and vegetables, whole grains, healthy fats, and lean proteins.  If instructed, eat more foods that contain a lot of potassium, such as: ? Nuts, such as peanuts and pistachios. ? Seeds, such as sunflower seeds and pumpkin seeds. ? Peas, lentils, and lima beans. ? Whole grain and bran cereals and breads. ? Fresh fruits and vegetables, such as apricots, avocado, bananas, cantaloupe, kiwi, oranges, tomatoes, asparagus, and potatoes. ? Orange juice. ? Tomato juice. ? Red meats. ? Yogurt.  Keep all follow-up visits as told by your health care provider. This is important. Contact a health care provider if:  You have weakness that gets worse.  You feel your heart pounding or racing.  You vomit.  You have diarrhea.  You have diabetes (diabetes mellitus) and you have trouble keeping your blood sugar (glucose) in your target range. Get help right away if:  You have chest pain.  You have shortness of breath.  You have vomiting or diarrhea that lasts for more than 2 days.  You faint. This information is not intended to replace advice given to you by your health care provider. Make sure you discuss any questions you have with your health care provider. Document Released: 08/13/2005 Document Revised: 03/31/2016 Document Reviewed: 03/31/2016 Elsevier Interactive Patient Education  2018 [employer].

## 2017-09-06 NOTE — Assessment & Plan Note (Addendum)
#  Metastatic breast cancer/lobular ER/PR positive HER-2/neu negative-July-AUG 2018- progression in the bone L3; also abdomen causing extrinsic compression of the small bowel [status post resection-C discussion below]. On Taxol weekly.  # proceed with cycle # 2 day-1 today; Labs today reviewed;  acceptable for treatment today- except for- [see below]-anemia hemoglobin 8.3/ severe hypokalemia.   # severe hypokalemia- K 2.3; plan K supp today; also supp at home.   # Lytic lesion at L3- positive on PET scan. Lumbar spine shows L3 lesion- on RT [last treatment on 8/27]; no clinical evidence of progression.  We will need follow-up imaging.  # Bone metastases-s/p X-geva; HOLD [because of severe hypocalcemia/multiple electrolyte abnormalities]   # Neck pain/bone pain from malignancy- continue oxycodone.   #  Anemia-multifactorial hemoglobin - improving Hb  8.3. -monitor for now.    # weekly cbc/Taxol-1 w; in 2 weeks/MD/labs-taxol.

## 2017-09-06 NOTE — Progress Notes (Signed)
4081 am -- Critical potassium level called by Marc Morgans in lab tech.  2.4 potassium level.

## 2017-09-06 NOTE — Progress Notes (Signed)
[facility] OFFICE PROGRESS NOTE  Patient Care Team: Donnie Coffin, MD as PCP - General (Family Medicine) Donnie Coffin, MD as Referring Physician (Family Medicine) Christene Lye, MD (General Surgery) Forest Gleason, MD (Oncology) [name], MD as Consulting Physician (Internal Medicine)  Cancer of left breast [facility])   Staging form: Breast, AJCC 7th Edition     Clinical: Stage IIIB (T4, N1, cM0(i+)) - Signed by Forest Gleason, MD on 08/09/2015     Pathologic: Stage IV (T4, N1, M1) - Signed by Forest Gleason, MD on 08/16/2015    Oncology History   # DEC 2016-  LEFT BREAST CA- LOBULAR CA ER/PR-Pos; her 2 NEG STAGE IV [A0O4H9- left breast/bil Ax LN/Media LN/RP LN; skeletal mets]; DEC 2016- LETROZOLE + IBRANCE; April 2017- Significant response to treatment- [Dr.Sankar]; s/p Left mastec & ALND [palliative]; cont Ibrance + Letrzole. [facility] 2018/sec to Anemia]  # July 2018- PROGRESSION [PET scan-L1 lytic lesion; Small bowel extrinsic compression s/p lap resection + Lobular cancer; Dr.Davis ]  # AUG 9th th-FASLODEX; SEP 1 week- Verzinio 150 BID;STOP clinical progression [DEC 2018 CT- no obvious mass noted]  # Dec 7th Taxol weekly;  # AUG 2018- s/p RT to L3 lytic lesion  # RIGHT BREAST CA [s/p mastectomy [facility]]  -------------------------------------------------    1.) Retrocolic retrogastric Roux-en-Y (gastrojejunostomy) bypass of distal duodenal obstruction (cpt: 43820) 2.) Segmental jejunal small bowel resection for accessible tumor pathology (cpt: 97741)  INTRAOPERATIVE FINDINGS: Large firm irregular mass encasing duodenum at approximately the ligament of Treitz with diffuse tumor implant studding along much of small intestinal omentum and serosa  # [facility]- ordered 12th Dec       Carcinoma of overlapping sites of left breast in female, estrogen receptor positive ([place])    INTERVAL HISTORY:  Sophia Nelson 72 y.o.  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   Patient is currently status post cycle #1.  Patient has not had any recent admission to the hospital again.  Denies any worsening abdominal pain nausea vomiting.  Patient has intermittent diarrhea.  She has been taking Imodium.  Denies any worsening shortness of breath with exertion and cough. Denies any fevers or chills.  Denies any constipation.  She denies any cramps in the legs.  Patient continues to be on Percocet as needed for pain.  REVIEW OF SYSTEMS:  A complete 10 point review of system is done which is negative except mentioned above/history of present illness.   PAST MEDICAL HISTORY :  Past Medical History:  Diagnosis Date  . Abdominal distention   . AKI (acute kidney injury) ([place]) 02/24/2017  . Anemia   . Arthritis   . Asthma   . Back pain, chronic 03/15/2017  . Breast cancer ([place]) 2009   RT MASTECTOMY, DCIS  . Cancer of intra-abdominal ([place])    Metastatic breast cancer lobular carcinoma wth recurrence in the abdomen status post resection  . Cancer of left breast ([place]) 08/09/2015   T4 N1 M1 tumor, INVASIVE LOBULAR CARCINOMA.  . Carcinoma of overlapping sites of left breast in female, estrogen receptor positive ([place]) 03/22/2016  . COPD (chronic obstructive pulmonary disease) ([place])   . Diabetes ([place]) 02/24/2017  . Diabetes mellitus without complication ([place])   . Duodenal obstruction   . Encounter for nasogastric (NG) tube placement   . Gastric outlet obstruction 02/24/2017  .  GERD (gastroesophageal reflux disease)   . Headache    h/o migraines as a child  . Hypertension   . Neck pain 04/19/2016  . Pancreatitis, acute 03/04/2017  . Pneumonia 2015  . Recurrent low back pain 03/15/2017  . Shortness of breath dyspnea    with exertion    PAST SURGICAL HISTORY :   Past Surgical History:  Procedure Laterality  Date  . ABDOMINAL HYSTERECTOMY    . BREAST SURGERY Right 2015   mastectomy  . ESOPHAGOGASTRODUODENOSCOPY N/A 02/25/2017   Procedure: ESOPHAGOGASTRODUODENOSCOPY (EGD);  Surgeon: Jonathon Bellows, MD;  Location: [facility] ENDOSCOPY;  Service: Endoscopy;  Laterality: N/A;  . ESOPHAGOGASTRODUODENOSCOPY (EGD) WITH PROPOFOL N/A 03/06/2017   Procedure: ESOPHAGOGASTRODUODENOSCOPY (EGD) WITH PROPOFOL;  Surgeon: [name], MD;  Location: [facility] ENDOSCOPY;  Service: Endoscopy;  Laterality: N/A;  . GASTROJEJUNOSTOMY N/A 03/08/2017   Procedure: Roux-en-Y gastrojejunostomy Bypass of Gastric Outlet Obstruction, small bowel resection;  Surgeon: Vickie Epley, MD;  Location: [facility] ORS;  Service: General;  Laterality: N/A;  . PORTACATH PLACEMENT Right 08/01/2017   Procedure: INSERTION PORT-A-CATH;  Surgeon: Christene Lye, MD;  Location: [facility] ORS;  Service: General;  Laterality: Right;  . SIMPLE MASTECTOMY WITH AXILLARY SENTINEL NODE BIOPSY Left 02/29/2016   Procedure: SIMPLE MASTECTOMY;  Surgeon: Christene Lye, MD;  Location: [facility] ORS;  Service: General;  Laterality: Left;  . TUBAL LIGATION      FAMILY HISTORY :   Family History  Problem Relation Age of Onset  . Lung cancer Father   . Cancer Maternal Aunt   . Breast cancer Neg Hx     SOCIAL HISTORY:   Social History   Tobacco Use  . Smoking status: Former Smoker    Packs/day: 0.25    Years: 15.00    Pack years: 3.75    Types: Cigarettes    Last attempt to quit: 07/23/2017    Years since quitting: 0.1  . Smokeless tobacco: Never Used  . Tobacco comment: currently as of 02-21-16 pt states she is only smoking 2-3 cigarettes per day  Substance Use Topics  . 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 Drug use: No    ALLERGIES:  is allergic to other.  MEDICATIONS:  Current Outpatient Medications  Medication Sig Dispense  Refill  . acetaminophen (TYLENOL) 325 MG tablet Take 1 tablet (325 mg total) by mouth every 6 (six) hours as needed for mild pain (or Fever >/= 101).    . ADVAIR DISKUS 500-50 MCG/DOSE AEPB Inhale 1 puff into the lungs 2 (two) times daily.     [name] albuterol (PROVENTIL HFA;VENTOLIN HFA) 108 (90 Base) MCG/ACT inhaler Inhale 2 puffs into the lungs every 6 (six) hours as needed for wheezing or shortness of breath.    [name] aspirin 81 MG tablet Take 81 mg by mouth daily.    [name] atorvastatin (LIPITOR) 10 MG tablet Take 10 mg by mouth daily.    [name] docusate sodium (COLACE) 100 MG capsule Take 100 mg by mouth 2 (two) times daily.    . ergocalciferol (VITAMIN D2) 50000 units capsule Take 1 capsule (50,000 Units total) by mouth once a week. (Patient taking differently: Take 50,000 Units by mouth every Monday. ) 12 capsule 1  . feeding supplement, ENSURE ENLIVE, (ENSURE ENLIVE) LIQD Take 237 mLs by mouth 3 (three) times daily between meals. 237 mL  12  . fluticasone (FLONASE) 50 MCG/ACT nasal spray Place 2 sprays into both nostrils daily.    [name] guaiFENesin (MUCINEX) 600 MG 12 hr tablet Take 1 tablet (600 mg total) by mouth 2 (two) times daily as needed for cough or to loosen phlegm.    . lidocaine-prilocaine (EMLA) cream Apply 1 application topically as needed (for port access).     [name] loperamide (IMODIUM) 2 MG capsule One pill after each loose stool; maximum upto 8 pills a day. (Patient taking differently: Take 2 mg by mouth as needed for diarrhea or loose stools (Up to 8 pills (16 mg) a day). ) 40 capsule 0  . loratadine (CLARITIN) 10 MG tablet Take 10 mg by mouth daily.    . Magnesium Cl-Calcium Carbonate (SLOW MAGNESIUM/CALCIUM) 70-117 MG TBEC Take 1 tablet by mouth 2 (two) times daily. 60 tablet 6  . magnesium hydroxide (MILK OF MAGNESIA) 400 MG/5ML suspension Take 30-45 mLs by mouth daily as needed for mild constipation.    . metoCLOPramide (REGLAN) 10 MG tablet Take 1 tablet (10 mg total) by mouth 4 (four) times  daily. 120 tablet 3  . montelukast (SINGULAIR) 10 MG tablet Take 10 mg by mouth at bedtime.    [name] omeprazole (PRILOSEC) 20 MG capsule Take 20 mg by mouth every morning.     . ondansetron (ZOFRAN ODT) 8 MG disintegrating tablet Take 1 tablet (8 mg total) by mouth every 8 (eight) hours as needed for nausea or vomiting. 20 tablet 3  . oxyCODONE-acetaminophen (PERCOCET/ROXICET) 5-325 MG tablet Take 1 tablet by mouth every 8 (eight) hours as needed for severe pain. 90 tablet 0  . OXYGEN Inhale 2 L into the lungs continuous.     . prochlorperazine (COMPAZINE) 10 MG tablet Take 1 tablet (10 mg total) by mouth every 6 (six) hours as needed for nausea or vomiting. 30 tablet 3  . senna-docusate (SENOKOT-S) 8.6-50 MG tablet Take 1 tablet by mouth at bedtime as needed for mild constipation.    [name] SPIRIVA HANDIHALER 18 MCG inhalation capsule Place 18 mcg into inhaler and inhale every morning.     . potassium chloride SA (K-DUR,KLOR-CON) 20 MEQ tablet 1 pill twice a day 60 tablet 3   No current facility-administered medications for this visit.    Facility-Administered Medications Ordered in Other Visits  Medication Dose Route Frequency Provider Last Rate Last Dose  . heparin lock flush 100 unit/mL  500 Units Intracatheter Once PRN Charlaine Dalton R, MD      . sodium chloride 0.9 % 250 mL with potassium chloride 40 mEq infusion   Intravenous Once Charlaine Dalton R, MD      . sodium chloride flush (NS) 0.9 % injection 10 mL  10 mL Intracatheter PRN [name], MD        PHYSICAL EXAMINATION: ECOG PERFORMANCE STATUS: 0 - Asymptomatic  BP 100/66 (BP Location: Left Arm, Patient Position: Sitting)   Pulse 92   Resp 16   Wt 115 lb (52.2 kg)   BMI 21.03 kg/m   Filed Weights   09/06/17 0939 09/06/17 0941  Weight: 115 lb (52.2 kg) 115 lb (52.2 kg)    GENERAL: Moderately nourished well-developed; Alert, no distress and comfortable. She is in a wheelchair. EYES: no pallor or  icterus OROPHARYNX: no thrush or ulceration; poor dentition.   NECK: supple, no masses felt LYMPH:  no palpable lymphadenopathy in the cervical, axillary or inguinal regions LUNGS: clear to auscultation and  No wheeze or crackles  HEART/CVS: regular rate & rhythm and no murmurs; No lower extremity edema ABDOMEN:abdomen soft, non-tender and normal bowel sounds Musculoskeletal:no cyanosis of digits and no clubbing;  PSYCH: alert & oriented x 3 with fluent speech NEURO: no focal motor/sensory deficits SKIN:  no rashes or significant lesions   LABORATORY DATA:  I have reviewed the data as listed    Component Value Date/Time   NA 138 09/06/2017 0917   NA 133 (L) 04/08/2014 1738   K 2.4 (LL) 09/06/2017 0917   K 4.2 04/08/2014 1738   CL 104 09/06/2017 0917   CL 99 04/08/2014 1738   CO2 27 09/06/2017 0917   CO2 28 04/08/2014 1738   GLUCOSE 181 (H) 09/06/2017 0917   GLUCOSE 116 (H) 04/08/2014 1738   BUN 12 09/06/2017 0917   BUN 5 (L) 04/08/2014 1738   CREATININE 0.33 (L) 09/06/2017 0917   CREATININE 0.42 (L) 04/08/2014 1738   CALCIUM 8.3 (L) 09/06/2017 0917   CALCIUM 4.9 06/09/2017 1441   PROT 4.9 (L) 09/06/2017 0917   ALBUMIN 2.1 (L) 09/06/2017 0917   AST 27 09/06/2017 0917   ALT 8 (L) 09/06/2017 0917   ALKPHOS 80 09/06/2017 0917   BILITOT 0.5 09/06/2017 0917   GFRNONAA >60 09/06/2017 0917   GFRNONAA >60 04/08/2014 1738   GFRAA >60 09/06/2017 0917   GFRAA >60 04/08/2014 1738    No results found for: SPEP, UPEP  Lab Results  Component Value Date   WBC 9.9 09/06/2017   NEUTROABS 7.4 (H) 09/06/2017   HGB 8.5 (L) 09/06/2017   HCT 25.8 (L) 09/06/2017   MCV 88.7 09/06/2017   PLT 379 09/06/2017      Chemistry      Component Value Date/Time   NA 138 09/06/2017 0917   NA 133 (L) 04/08/2014 1738   K 2.4 (LL) 09/06/2017 0917   K 4.2 04/08/2014 1738   CL 104 09/06/2017 0917   CL 99 04/08/2014 1738   CO2 27 09/06/2017 0917   CO2 28 04/08/2014 1738   BUN 12 09/06/2017  0917   BUN 5 (L) 04/08/2014 1738   CREATININE 0.33 (L) 09/06/2017 0917   CREATININE 0.42 (L) 04/08/2014 1738      Component Value Date/Time   CALCIUM 8.3 (L) 09/06/2017 0917   CALCIUM 4.9 06/09/2017 1441   ALKPHOS 80 09/06/2017 0917   AST 27 09/06/2017 0917   ALT 8 (L) 09/06/2017 0917   BILITOT 0.5 09/06/2017 0917       RADIOGRAPHIC STUDIES: I have personally reviewed the radiological images as listed and agreed with the findings in the report. No results found.   ASSESSMENT & PLAN:  Carcinoma of overlapping sites of left breast in female, estrogen receptor positive ([place]) # Metastatic breast cancer/lobular ER/PR positive HER-2/neu negative-July-AUG 2018- progression in the bone L3; also abdomen causing extrinsic compression of the small bowel [status post resection-C discussion below]. On Taxol weekly.  # proceed with cycle # 2 day-1 today; Labs today reviewed;  acceptable for treatment today- except for- [see below]-anemia hemoglobin 8.3/ severe hypokalemia.   # severe hypokalemia- K 2.3; plan K supp today; also supp at home.   # Lytic lesion at L3- positive on PET scan. Lumbar spine shows L3 lesion- on RT [last treatment on 8/27]; no clinical evidence of progression.  We will need follow-up imaging.  # Bone metastases-s/p X-geva; HOLD [because of severe hypocalcemia/multiple electrolyte abnormalities]   # Neck pain/bone pain from malignancy- continue oxycodone.   #  Anemia-multifactorial hemoglobin -  improving Hb  8.3. -monitor for now.    # weekly cbc/Taxol-1 w; in 2 weeks/MD/labs-taxol.     Orders Placed This Encounter  Procedures  . CBC with Differential    Standing Status:   Future    Standing Expiration Date:   09/06/2018  . Basic metabolic panel    Standing Status:   Future    Standing Expiration Date:   09/06/2018  . CBC with Differential    Standing Status:   Future    Standing Expiration Date:   09/06/2018  . Comprehensive metabolic panel    Standing Status:    Future    Standing Expiration Date:   09/06/2018       [name], MD 09/06/2017 1:43 PM

## 2017-09-12 ENCOUNTER — Encounter: Payer: Self-pay | Admitting: Internal Medicine

## 2017-09-13 ENCOUNTER — Inpatient Hospital Stay: Payer: Medicare HMO | Admitting: *Deleted

## 2017-09-13 ENCOUNTER — Other Ambulatory Visit: Payer: Self-pay | Admitting: Internal Medicine

## 2017-09-13 ENCOUNTER — Inpatient Hospital Stay: Payer: Medicare HMO

## 2017-09-13 VITALS — BP 111/75 | HR 100 | Temp 97.7°F | Resp 17 | Wt 122.8 lb

## 2017-09-13 DIAGNOSIS — Z5111 Encounter for antineoplastic chemotherapy: Secondary | ICD-10-CM | POA: Diagnosis not present

## 2017-09-13 DIAGNOSIS — Z7189 Other specified counseling: Secondary | ICD-10-CM

## 2017-09-13 DIAGNOSIS — C50812 Malignant neoplasm of overlapping sites of left female breast: Secondary | ICD-10-CM

## 2017-09-13 DIAGNOSIS — Z170 Estrogen receptor positive status [ER+]: Principal | ICD-10-CM

## 2017-09-13 LAB — CBC WITH DIFFERENTIAL/PLATELET
Basophils Absolute: 0.1 10*3/uL (ref 0–0.1)
Basophils Relative: 1 %
Eosinophils Absolute: 0.2 10*3/uL (ref 0–0.7)
Eosinophils Relative: 3 %
HEMATOCRIT: 24.6 % — AB (ref 35.0–47.0)
Hemoglobin: 8.2 g/dL — ABNORMAL LOW (ref 12.0–16.0)
LYMPHS ABS: 1.7 10*3/uL (ref 1.0–3.6)
Lymphocytes Relative: 22 %
MCH: 29.4 pg (ref 26.0–34.0)
MCHC: 33.3 g/dL (ref 32.0–36.0)
MCV: 88.3 fL (ref 80.0–100.0)
MONO ABS: 0.5 10*3/uL (ref 0.2–0.9)
MONOS PCT: 6 %
NEUTROS ABS: 5.4 10*3/uL (ref 1.4–6.5)
Neutrophils Relative %: 68 %
Platelets: 324 10*3/uL (ref 150–440)
RBC: 2.79 MIL/uL — ABNORMAL LOW (ref 3.80–5.20)
RDW: 16.5 % — AB (ref 11.5–14.5)
WBC: 7.8 10*3/uL (ref 3.6–11.0)

## 2017-09-13 LAB — COMPREHENSIVE METABOLIC PANEL
ALBUMIN: 2.1 g/dL — AB (ref 3.5–5.0)
ALT: 9 U/L — ABNORMAL LOW (ref 14–54)
ANION GAP: 7 (ref 5–15)
AST: 29 U/L (ref 15–41)
Alkaline Phosphatase: 65 U/L (ref 38–126)
BUN: 12 mg/dL (ref 6–20)
CO2: 29 mmol/L (ref 22–32)
Calcium: 8.3 mg/dL — ABNORMAL LOW (ref 8.9–10.3)
Chloride: 103 mmol/L (ref 101–111)
Creatinine, Ser: 0.46 mg/dL (ref 0.44–1.00)
GFR calc Af Amer: >60 mL/min (ref >60)
GFR calc non Af Amer: >60 mL/min (ref >60)
GLUCOSE: 164 mg/dL — AB (ref 65–99)
Potassium: 3 mmol/L — ABNORMAL LOW (ref 3.5–5.1)
SODIUM: 139 mmol/L (ref 135–145)
Total Bilirubin: 0.3 mg/dL (ref 0.3–1.2)
Total Protein: 4.8 g/dL — ABNORMAL LOW (ref 6.5–8.1)

## 2017-09-13 MED ORDER — SODIUM CHLORIDE 0.9 % IV SOLN
Freq: Once | INTRAVENOUS | Status: AC
  Administered 2017-09-13: 10:00:00 via INTRAVENOUS
  Filled 2017-09-13: qty 1000

## 2017-09-13 MED ORDER — SODIUM CHLORIDE 0.9 % IV SOLN
Freq: Once | INTRAVENOUS | Status: AC
  Administered 2017-09-13: 10:00:00 via INTRAVENOUS
  Filled 2017-09-13: qty 20

## 2017-09-13 MED ORDER — SODIUM CHLORIDE 0.9 % IV SOLN
80.0000 mg/m2 | Freq: Once | INTRAVENOUS | Status: AC
  Administered 2017-09-13: 120 mg via INTRAVENOUS
  Filled 2017-09-13: qty 20

## 2017-09-13 MED ORDER — DEXAMETHASONE SODIUM PHOSPHATE 10 MG/ML IJ SOLN
10.0000 mg | Freq: Once | INTRAMUSCULAR | Status: AC
  Administered 2017-09-13: 10 mg via INTRAVENOUS
  Filled 2017-09-13: qty 1

## 2017-09-13 MED ORDER — FAMOTIDINE IN NACL 20-0.9 MG/50ML-% IV SOLN
20.0000 mg | Freq: Once | INTRAVENOUS | Status: AC
  Administered 2017-09-13: 20 mg via INTRAVENOUS

## 2017-09-13 MED ORDER — DIPHENHYDRAMINE HCL 50 MG/ML IJ SOLN
50.0000 mg | Freq: Once | INTRAMUSCULAR | Status: AC
  Administered 2017-09-13: 50 mg via INTRAVENOUS
  Filled 2017-09-13: qty 1

## 2017-09-13 MED ORDER — HEPARIN SOD (PORK) LOCK FLUSH 100 UNIT/ML IV SOLN
500.0000 [IU] | Freq: Once | INTRAVENOUS | Status: AC | PRN
  Administered 2017-09-13: 500 [IU]
  Filled 2017-09-13: qty 5

## 2017-09-13 MED ORDER — SODIUM CHLORIDE 0.9% FLUSH
10.0000 mL | INTRAVENOUS | Status: DC | PRN
  Administered 2017-09-13: 10 mL
  Filled 2017-09-13: qty 10

## 2017-09-13 NOTE — Progress Notes (Signed)
OKay to proceed with treatment, pt will receive IV potassium per Dr. Rogue Bussing. Sonia Baller NP at chairside to assess pt complaints of rt foot swelling and difficulty moving rt toes.  Pt educated on Ted hose wear, staff assist on applying a pair. Pt verbalizes understanding.

## 2017-09-16 ENCOUNTER — Encounter: Payer: Self-pay | Admitting: Oncology

## 2017-09-16 ENCOUNTER — Telehealth (HOSPITAL_COMMUNITY): Payer: Self-pay | Admitting: Oncology

## 2017-09-16 NOTE — Telephone Encounter (Signed)
During the infusion, patient complained of bilateral foot swelling.  Dr. Aletha Halim team asked NP to examine patient prior to administration of chemotherapy.  On examination bilateral lower extremities are edematous.  R > L.  +3 pitting edema in right foot and +1 in left foot. She also complained of worsening neuropathy in both feet.  She states she is unable to move her right toes due to the swelling.  Lungs clear to auscultation.  Heart sounds normal.  She denies infection or fever.  Blood pressure stable.  She denies any pain to lower extremities other than tingling.  Suspect dependent edema.  Patient tells me "I was taking a medication that made me pee, but was taken off that by my PCP".  Reviewed medications.  Recommend elevation and TED hose to encourage circulation.  Patient states "I cannot afford to buy TED hose".  Provided patient with 2 pairs of TED hose from clinic supply.  RN in infusion applied hose and  educated patient on how to use.  All additional questions are answered.  Dr. Rogue Bussing informed.  Proceed with chemotherapy today. She is scheduled to see him next week and he will follow-up.   Faythe Casa, NP 09/16/2017 8:06 AM

## 2017-09-20 ENCOUNTER — Inpatient Hospital Stay: Payer: Medicare HMO

## 2017-09-20 ENCOUNTER — Inpatient Hospital Stay (HOSPITAL_BASED_OUTPATIENT_CLINIC_OR_DEPARTMENT_OTHER): Payer: Medicare HMO | Admitting: Internal Medicine

## 2017-09-20 VITALS — BP 96/93 | HR 90 | Temp 97.6°F | Resp 20

## 2017-09-20 DIAGNOSIS — C50812 Malignant neoplasm of overlapping sites of left female breast: Secondary | ICD-10-CM

## 2017-09-20 DIAGNOSIS — C7951 Secondary malignant neoplasm of bone: Secondary | ICD-10-CM

## 2017-09-20 DIAGNOSIS — Z170 Estrogen receptor positive status [ER+]: Principal | ICD-10-CM

## 2017-09-20 DIAGNOSIS — Z5111 Encounter for antineoplastic chemotherapy: Secondary | ICD-10-CM | POA: Diagnosis not present

## 2017-09-20 DIAGNOSIS — Z7189 Other specified counseling: Secondary | ICD-10-CM

## 2017-09-20 LAB — CBC WITH DIFFERENTIAL/PLATELET
BASOS PCT: 1 %
Basophils Absolute: 0.1 10*3/uL (ref 0–0.1)
Eosinophils Absolute: 0.1 10*3/uL (ref 0–0.7)
Eosinophils Relative: 2 %
HEMATOCRIT: 25 % — AB (ref 35.0–47.0)
HEMOGLOBIN: 8.2 g/dL — AB (ref 12.0–16.0)
LYMPHS ABS: 1.1 10*3/uL (ref 1.0–3.6)
Lymphocytes Relative: 22 %
MCH: 29.4 pg (ref 26.0–34.0)
MCHC: 32.9 g/dL (ref 32.0–36.0)
MCV: 89.4 fL (ref 80.0–100.0)
MONOS PCT: 7 %
Monocytes Absolute: 0.3 10*3/uL (ref 0.2–0.9)
NEUTROS ABS: 3.5 10*3/uL (ref 1.4–6.5)
NEUTROS PCT: 68 %
Platelets: 348 10*3/uL (ref 150–440)
RBC: 2.8 MIL/uL — ABNORMAL LOW (ref 3.80–5.20)
RDW: 16.5 % — ABNORMAL HIGH (ref 11.5–14.5)
WBC: 5.1 10*3/uL (ref 3.6–11.0)

## 2017-09-20 LAB — BASIC METABOLIC PANEL
Anion gap: 6 (ref 5–15)
BUN: 11 mg/dL (ref 6–20)
CO2: 27 mmol/L (ref 22–32)
Calcium: 8.3 mg/dL — ABNORMAL LOW (ref 8.9–10.3)
Chloride: 106 mmol/L (ref 101–111)
Creatinine, Ser: 0.5 mg/dL (ref 0.44–1.00)
GFR calc Af Amer: >60 mL/min (ref >60)
GLUCOSE: 101 mg/dL — AB (ref 65–99)
POTASSIUM: 3.7 mmol/L (ref 3.5–5.1)
Sodium: 139 mmol/L (ref 135–145)

## 2017-09-20 MED ORDER — SODIUM CHLORIDE 0.9% FLUSH
10.0000 mL | Freq: Once | INTRAVENOUS | Status: AC
  Administered 2017-09-20: 10 mL via INTRAVENOUS
  Filled 2017-09-20: qty 10

## 2017-09-20 MED ORDER — SODIUM CHLORIDE 0.9 % IV SOLN
Freq: Once | INTRAVENOUS | Status: AC
  Administered 2017-09-20: 10:00:00 via INTRAVENOUS
  Filled 2017-09-20: qty 1000

## 2017-09-20 MED ORDER — HEPARIN SOD (PORK) LOCK FLUSH 100 UNIT/ML IV SOLN
500.0000 [IU] | Freq: Once | INTRAVENOUS | Status: AC
  Administered 2017-09-20: 500 [IU] via INTRAVENOUS
  Filled 2017-09-20: qty 5

## 2017-09-20 MED ORDER — SODIUM CHLORIDE 0.9 % IV SOLN
80.0000 mg/m2 | Freq: Once | INTRAVENOUS | Status: AC
  Administered 2017-09-20: 120 mg via INTRAVENOUS
  Filled 2017-09-20: qty 20

## 2017-09-20 MED ORDER — DIPHENHYDRAMINE HCL 50 MG/ML IJ SOLN
50.0000 mg | Freq: Once | INTRAMUSCULAR | Status: AC
  Administered 2017-09-20: 50 mg via INTRAVENOUS
  Filled 2017-09-20: qty 1

## 2017-09-20 MED ORDER — DEXAMETHASONE SODIUM PHOSPHATE 10 MG/ML IJ SOLN
10.0000 mg | Freq: Once | INTRAMUSCULAR | Status: AC
  Administered 2017-09-20: 10 mg via INTRAVENOUS
  Filled 2017-09-20: qty 1

## 2017-09-20 MED ORDER — FAMOTIDINE IN NACL 20-0.9 MG/50ML-% IV SOLN
20.0000 mg | Freq: Once | INTRAVENOUS | Status: AC
  Administered 2017-09-20: 20 mg via INTRAVENOUS
  Filled 2017-09-20: qty 50

## 2017-09-20 NOTE — Progress Notes (Signed)
[place] OFFICE PROGRESS NOTE  Patient Care Team: Donnie Coffin, MD as PCP - General (Family Medicine) Donnie Coffin, MD as Referring Physician (Family Medicine) Christene Lye, MD (General Surgery) Forest Gleason, MD (Oncology) [name], MD as Consulting Physician (Internal Medicine)  Cancer of left breast [facility])   Staging form: Breast, AJCC 7th Edition     Clinical: Stage IIIB (T4, N1, cM0(i+)) - Signed by Forest Gleason, MD on 08/09/2015     Pathologic: Stage IV (T4, N1, M1) - Signed by Forest Gleason, MD on 08/16/2015    Oncology History   # DEC 2016-  LEFT BREAST CA- LOBULAR CA ER/PR-Pos; her 2 NEG STAGE IV [[id number]- left breast/bil Ax LN/Media LN/RP LN; skeletal mets]; DEC 2016- LETROZOLE + IBRANCE; April 2017- Significant response to treatment- [Dr.Sankar]; s/p Left mastec & ALND [palliative]; cont Ibrance + Letrzole. [facility] 2018/sec to Anemia]  # July 2018- PROGRESSION [PET scan-L1 lytic lesion; Small bowel extrinsic compression s/p lap resection + Lobular cancer; Dr.Davis ]  # AUG 9th th-FASLODEX; SEP 1 week- Verzinio 150 BID;STOP clinical progression [DEC 2018 CT- no obvious mass noted]  # Dec 7th Taxol weekly;  # AUG 2018- s/p RT to L3 lytic lesion  # RIGHT BREAST CA [s/p mastectomy [facility]]  -------------------------------------------------    1.) Retrocolic retrogastric Roux-en-Y (gastrojejunostomy) bypass of distal duodenal obstruction (cpt: 43820) 2.) Segmental jejunal small bowel resection for accessible tumor pathology (cpt: 07867)  INTRAOPERATIVE FINDINGS: Large firm irregular mass encasing duodenum at approximately the ligament of Treitz with diffuse tumor implant studding along much of small intestinal omentum and serosa  # [employer]- 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   Patient is currently status post cycle #2; day -8 so far.  Denies any worsening shortness of breath with exertion and cough. Denies any fevers or chills.  Denies any constipation.  She denies any cramps in the legs.  Patient continues to be on Percocet as needed for pain.  Continues to complain of fatigue.  Continues to have 1-2 loose stools a day.  Takes Imodium.  REVIEW OF SYSTEMS:  A complete 10 point review of system is done which is negative except mentioned above/history of present illness.   PAST MEDICAL HISTORY :  Past Medical History:  Diagnosis Date  . Abdominal distention   . AKI (acute kidney injury) ([place]) 02/24/2017  . Anemia   . Arthritis   . Asthma   . Back pain, chronic 03/15/2017  . Breast cancer ([place]) 2009   RT MASTECTOMY, DCIS  . Cancer of intra-abdominal ([place])    Metastatic breast cancer lobular carcinoma wth recurrence in the abdomen status post resection  . Cancer of left breast ([place]) 08/09/2015   T4 N1 M1 tumor, INVASIVE LOBULAR CARCINOMA.  . Carcinoma of overlapping sites of left breast in female, estrogen receptor positive ([place]) 03/22/2016  . COPD (chronic obstructive pulmonary disease) ([place])   . Diabetes ([place]) 02/24/2017  . Diabetes mellitus without complication ([place])   . Duodenal obstruction   . Encounter for nasogastric (NG) tube placement   . Gastric outlet obstruction 02/24/2017  . GERD (gastroesophageal reflux disease)   .  Headache    h/o migraines as a child  . Hypertension   . Neck pain 04/19/2016  . Pancreatitis, acute 03/04/2017  . Pneumonia 2015  . Recurrent low back pain 03/15/2017  . Shortness of breath dyspnea    with exertion    PAST SURGICAL HISTORY :   Past Surgical History:  Procedure Laterality Date  . ABDOMINAL HYSTERECTOMY    . BREAST SURGERY  Right 2015   mastectomy  . ESOPHAGOGASTRODUODENOSCOPY N/A 02/25/2017   Procedure: ESOPHAGOGASTRODUODENOSCOPY (EGD);  Surgeon: Jonathon Bellows, MD;  Location[facility] ENDOSCOPY;  Service: Endoscopy;  Laterality: N/A;  . ESOPHAGOGASTRODUODENOSCOPY (EGD) WITH PROPOFOL N/A 03/06/2017   Procedure: ESOPHAGOGASTRODUODENOSCOPY (EGD) WITH PROPOFOL;  Surgeon: [name], MD;  Location: [facility] ENDOSCOPY;  Service: Endoscopy;  Laterality: N/A;  . GASTROJEJUNOSTOMY N/A 03/08/2017   Procedure: Roux-en-Y gastrojejunostomy Bypass of Gastric Outlet Obstruction, small bowel resection;  Surgeon: Vickie Epley, MD;  Location: [facility] ORS;  Service: General;  Laterality: N/A;  . PORTACATH PLACEMENT Right 08/01/2017   Procedure: INSERTION PORT-A-CATH;  Surgeon: Christene Lye, MD;  Location: [facility] ORS;  Service: General;  Laterality: Right;  . SIMPLE MASTECTOMY WITH AXILLARY SENTINEL NODE BIOPSY Left 02/29/2016   Procedure: SIMPLE MASTECTOMY;  Surgeon: Christene Lye, MD;  Location: [facility] ORS;  Service: General;  Laterality: Left;  . TUBAL LIGATION      FAMILY HISTORY :   Family History  Problem Relation Age of Onset  . Lung cancer Father   . Cancer Maternal Aunt   . Breast cancer Neg Hx     SOCIAL HISTORY:   Social History   Tobacco Use  . Smoking status: Former Smoker    Packs/day: 0.25    Years: 15.00    Pack years: 3.75    Types: Cigarettes    Last attempt to quit: 07/23/2017    Years since quitting: 0.1  . Smokeless tobacco: Never Used  . Tobacco comment: currently as of 02-21-16 pt states she is only smoking 2-3 cigarettes per day  Substance Use Topics  . 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 Drug use: No    ALLERGIES:  is allergic to other.  MEDICATIONS:  Current Outpatient Medications  Medication Sig Dispense Refill  . ADVAIR DISKUS 500-50 MCG/DOSE AEPB Inhale 1  puff into the lungs 2 (two) times daily.     [name] albuterol (PROVENTIL HFA;VENTOLIN HFA) 108 (90 Base) MCG/ACT inhaler Inhale 2 puffs into the lungs every 6 (six) hours as needed for wheezing or shortness of breath.    [name] aspirin 81 MG tablet Take 81 mg by mouth daily.    [name] atorvastatin (LIPITOR) 10 MG tablet Take 10 mg by mouth daily.    [name] docusate sodium (COLACE) 100 MG capsule Take 100 mg by mouth 2 (two) times daily.    . ergocalciferol (VITAMIN D2) 50000 units capsule Take 1 capsule (50,000 Units total) by mouth once a week. (Patient taking differently: Take 50,000 Units by mouth every Monday. ) 12 capsule 1  . feeding supplement, ENSURE ENLIVE, (ENSURE ENLIVE) LIQD Take 237 mLs by mouth 3 (three) times daily between meals. 237 mL 12  . fluticasone (FLONASE) 50 MCG/ACT nasal spray Place 2 sprays into both nostrils daily.    [name] guaiFENesin (MUCINEX) 600 MG 12 hr tablet Take 1 tablet (600 mg total) by mouth 2 (two)  times daily as needed for cough or to loosen phlegm.    . lidocaine-prilocaine (EMLA) cream Apply 1 application topically as needed (for port access).     [name] loperamide (IMODIUM) 2 MG capsule One pill after each loose stool; maximum upto 8 pills a day. (Patient taking differently: Take 2 mg by mouth as needed for diarrhea or loose stools (Up to 8 pills (16 mg) a day). ) 40 capsule 0  . loratadine (CLARITIN) 10 MG tablet Take 10 mg by mouth daily.    . Magnesium Cl-Calcium Carbonate (SLOW MAGNESIUM/CALCIUM) 70-117 MG TBEC Take 1 tablet by mouth 2 (two) times daily. 60 tablet 6  . magnesium hydroxide (MILK OF MAGNESIA) 400 MG/5ML suspension Take 30-45 mLs by mouth daily as needed for mild constipation.    . metoCLOPramide (REGLAN) 10 MG tablet Take 1 tablet (10 mg total) by mouth 4 (four) times daily. 120 tablet 3  . montelukast (SINGULAIR) 10 MG tablet Take 10 mg by mouth at bedtime.    [name] omeprazole (PRILOSEC) 20 MG capsule Take 20 mg by mouth every morning.     [name] oxyCODONE-acetaminophen  (PERCOCET/ROXICET) 5-325 MG tablet Take 1 tablet by mouth every 8 (eight) hours as needed for severe pain. 90 tablet 0  . OXYGEN Inhale 2 L into the lungs continuous.     . potassium chloride SA (K-DUR,KLOR-CON) 20 MEQ tablet 1 pill twice a day 60 tablet 3  . senna-docusate (SENOKOT-S) 8.6-50 MG tablet Take 1 tablet by mouth at bedtime as needed for mild constipation.    [name] SPIRIVA HANDIHALER 18 MCG inhalation capsule Place 18 mcg into inhaler and inhale every morning.     [name] acetaminophen (TYLENOL) 325 MG tablet Take 1 tablet (325 mg total) by mouth every 6 (six) hours as needed for mild pain (or Fever >/= 101). (Patient not taking: Reported on 09/20/2017)    . gabapentin (NEURONTIN) 300 MG capsule Take 300 mg by mouth 2 (two) times daily.    [name] levofloxacin (LEVAQUIN) 500 MG tablet Take 1 tablet (500 mg total) by mouth daily for 5 days. 5 tablet 0  . lisinopril (PRINIVIL,ZESTRIL) 40 MG tablet Take 40 mg by mouth daily.    . ondansetron (ZOFRAN ODT) 8 MG disintegrating tablet Take 1 tablet (8 mg total) by mouth every 8 (eight) hours as needed for nausea or vomiting. (Patient not taking: Reported on 09/20/2017) 20 tablet 3  . prochlorperazine (COMPAZINE) 10 MG tablet Take 1 tablet (10 mg total) by mouth every 6 (six) hours as needed for nausea or vomiting. (Patient not taking: Reported on 09/20/2017) 30 tablet 3   No current facility-administered medications for this visit.     PHYSICAL EXAMINATION: ECOG PERFORMANCE STATUS: 0 - Asymptomatic  BP (!) 96/93   Pulse 90   Temp 97.6 F (36.4 C) (Tympanic)   Resp 20   There were no vitals filed for this visit.  GENERAL: Moderately nourished well-developed; Alert, no distress and comfortable. She is in a wheelchair. EYES: no pallor or icterus OROPHARYNX: no thrush or ulceration; poor dentition.   NECK: supple, no masses felt LYMPH:  no palpable lymphadenopathy in the cervical, axillary or inguinal regions LUNGS: clear to auscultation and  No wheeze  or crackles HEART/CVS: regular rate & rhythm and no murmurs; No lower extremity edema ABDOMEN:abdomen soft, non-tender and normal bowel sounds Musculoskeletal:no cyanosis of digits and no clubbing;  PSYCH: alert & oriented x 3 with fluent speech NEURO: no focal motor/sensory deficits SKIN:  no rashes  or significant lesions   LABORATORY DATA:  I have reviewed the data as listed    Component Value Date/Time   NA 141 09/22/2017 0450   NA 133 (L) 04/08/2014 1738   K 3.1 (L) 09/22/2017 0450   K 4.2 04/08/2014 1738   CL 110 09/22/2017 0450   CL 99 04/08/2014 1738   CO2 27 09/22/2017 0450   CO2 28 04/08/2014 1738   GLUCOSE 103 (H) 09/22/2017 0450   GLUCOSE 116 (H) 04/08/2014 1738   BUN 10 09/22/2017 0450   BUN 5 (L) 04/08/2014 1738   CREATININE 0.60 09/22/2017 0450   CREATININE 0.42 (L) 04/08/2014 1738   CALCIUM 7.8 (L) 09/22/2017 0450   CALCIUM 4.9 06/09/2017 1441   PROT 4.8 (L) 09/13/2017 0903   ALBUMIN 2.1 (L) 09/13/2017 0903   AST 29 09/13/2017 0903   ALT 9 (L) 09/13/2017 0903   ALKPHOS 65 09/13/2017 0903   BILITOT 0.3 09/13/2017 0903   GFRNONAA >60 09/22/2017 0450   GFRNONAA >60 04/08/2014 1738   GFRAA >60 09/22/2017 0450   GFRAA >60 04/08/2014 1738    No results found for: SPEP, UPEP  Lab Results  Component Value Date   WBC 8.3 09/22/2017   NEUTROABS 8.9 (H) 09/21/2017   HGB 9.1 (L) 09/22/2017   HCT 21.0 (L) 09/22/2017   MCV 88.0 09/22/2017   PLT 252 09/22/2017      Chemistry      Component Value Date/Time   NA 141 09/22/2017 0450   NA 133 (L) 04/08/2014 1738   K 3.1 (L) 09/22/2017 0450   K 4.2 04/08/2014 1738   CL 110 09/22/2017 0450   CL 99 04/08/2014 1738   CO2 27 09/22/2017 0450   CO2 28 04/08/2014 1738   BUN 10 09/22/2017 0450   BUN 5 (L) 04/08/2014 1738   CREATININE 0.60 09/22/2017 0450   CREATININE 0.42 (L) 04/08/2014 1738      Component Value Date/Time   CALCIUM 7.8 (L) 09/22/2017 0450   CALCIUM 4.9 06/09/2017 1441   ALKPHOS 65 09/13/2017  0903   AST 29 09/13/2017 0903   ALT 9 (L) 09/13/2017 0903   BILITOT 0.3 09/13/2017 0903       RADIOGRAPHIC STUDIES: I have personally reviewed the radiological images as listed and agreed with the findings in the report. Ct Abdomen Pelvis W Contrast  Result Date: 09/22/2017 CLINICAL DATA:  Abdominal distension and pain with bowel movements. History of breast cancer. EXAM: CT ABDOMEN AND PELVIS WITH CONTRAST TECHNIQUE: Multidetector CT imaging of the abdomen and pelvis was performed using the standard protocol following bolus administration of intravenous contrast. CONTRAST:  174m ISOVUE-300 IOPAMIDOL (ISOVUE-300) INJECTION 61% COMPARISON:  07/29/2017.  Head CT 03/07/2017 FINDINGS: Lower chest: Bilateral effusions right larger than left with dependent atelectasis. Hepatobiliary: No liver parenchymal abnormality is seen. No calcified gallstones. Pancreas: Normal Spleen: Normal Adrenals/Urinary Tract: Adrenal glands are normal. Mild fullness of both renal collecting systems and ureters with ureters being prominent all the way to the bladder. No bladder pathology is discernible. Stomach/Bowel: Chronically dilated stomach. Duodenum is also dilated and fluid filled. There is a very abnormal short segment of bowel at the duodenal jejunal junction which shows shouldering and contrast enhancement. The concern is that this could represent a small bowel tumor, either primary or metastatic. There is a second focus of abnormal small intestine probably in the ileum located in the central right portion of the abdomen that also looks like tumor. This elevates the likelihood of metastatic disease,  though unusual. The differential diagnosis would be inflammatory bowel disease of some sort. There probably a third area of involvement in the right colon near the ileocecal valve region. No sign of diverticulosis or diverticulitis. No other focal bowel finding. Vascular/Lymphatic: Aortic atherosclerosis. No aneurysm. IVC is  normal. No retroperitoneal adenopathy. Reproductive: Previous hysterectomy.  No pelvic mass. Other: Moderate amount of free fluid in the pelvis, etiology uncertain. Musculoskeletal: Ordinary lumbar degenerative changes. Treated metastatic lesion of the L3 vertebral body no evidence of progressive change. IMPRESSION: Short-segment abnormal intestine at the duodenal jejunal junction with thickening and enhancement worrisome for primary tumor or metastatic tumor. There is a second focus of abnormal small intestine probably in the ileum in the mid to right portion of the abdomen that is also quite likely secondary to tumor, elevating the possibility of metastatic disease. Lastly, one could question thickening and enhancement in the right colon near the ileocecal valve that could be a third focus of involvement. The patient has a history of breast cancer and presumably this would be secondary to that process. However, melanoma can metastasis eyes to the intestine. Chronically dilated stomach and duodenum. New demonstration of mild fullness of the renal collecting systems and ureters, etiology unknown. No evidence of stone disease or obstructing bladder lesion. Small to moderate amount of free fluid in the pelvic cul de sac. Aortic atherosclerosis. Small bilateral pleural effusions with dependent atelectasis right more than left. Treated metastasis of the L3 vertebra. Electronically Signed   By: Nelson Chimes M.D.   On: 09/22/2017 13:44     ASSESSMENT & PLAN:  Carcinoma of overlapping sites of left breast in female, estrogen receptor positive ([place]) # Metastatic breast cancer/lobular ER/PR positive HER-2/neu negative-July-AUG 2018- progression in the bone L3; also abdomen causing extrinsic compression of the small bowel [status post resection-C discussion below]. On Taxol weekly.  # proceed with cycle # 2 day-15 today; Labs today reviewed;  acceptable for treatment today- except for- [see below]-anemia hemoglobin  8.3.  # Lytic lesion at L3- positive on PET scan. Lumbar spine shows L3 lesion- on RT [last treatment on 8/27]; no clinical evidence of progression.   # Bone metastases-s/p X-geva; HOLD [because of severe hypocalcemia/multiple electrolyte abnormalities]   # Neck pain/bone pain from malignancy- continue oxycodone.   #  Anemia-multifactorial hemoglobin - improving Hb  8.3. -monitor for now.  If worse would recommend Procrit.   # 2 weeks/MD/labs-taxol.    Orders Placed This Encounter  Procedures  . CBC with Differential    Standing Status:   Future    Standing Expiration Date:   09/20/2018  . Comprehensive metabolic panel    Standing Status:   Future    Standing Expiration Date:   09/20/2018       [name], MD 09/24/2017 7:20 AM

## 2017-09-20 NOTE — Progress Notes (Signed)
Patient presents to clinic for f/u for breast cancer and chemotherapy. Patient reports intermittent diarrhea (3 loose stools a day)-relieved by imodium AD. She drinks 6 bottles of water (16 ounces) per day. She complains of faitgue with ADLs. She denies any N&V.

## 2017-09-20 NOTE — Assessment & Plan Note (Addendum)
#  Metastatic breast cancer/lobular ER/PR positive HER-2/neu negative-July-AUG 2018- progression in the bone L3; also abdomen causing extrinsic compression of the small bowel [status post resection-C discussion below]. On Taxol weekly.  # proceed with cycle # 2 day-15 today; Labs today reviewed;  acceptable for treatment today- except for- [see below]-anemia hemoglobin 8.3.  # Lytic lesion at L3- positive on PET scan. Lumbar spine shows L3 lesion- on RT [last treatment on 8/27]; no clinical evidence of progression.   # Bone metastases-s/p X-geva; HOLD [because of severe hypocalcemia/multiple electrolyte abnormalities]   # Neck pain/bone pain from malignancy- continue oxycodone.   #  Anemia-multifactorial hemoglobin - improving Hb  8.3. -monitor for now.  If worse would recommend Procrit.   # 2 weeks/MD/labs-taxol.

## 2017-09-21 ENCOUNTER — Inpatient Hospital Stay
Admission: EM | Admit: 2017-09-21 | Discharge: 2017-09-23 | DRG: 871 | Disposition: A | Discharge status: Home / Self Care | Payer: Medicare HMO | Attending: Internal Medicine | Admitting: Internal Medicine

## 2017-09-21 ENCOUNTER — Encounter: Payer: Self-pay | Admitting: *Deleted

## 2017-09-21 ENCOUNTER — Emergency Department: Payer: Medicare HMO

## 2017-09-21 ENCOUNTER — Other Ambulatory Visit: Payer: Self-pay

## 2017-09-21 DIAGNOSIS — E43 Unspecified severe protein-calorie malnutrition: Secondary | ICD-10-CM | POA: Diagnosis present

## 2017-09-21 DIAGNOSIS — I10 Essential (primary) hypertension: Secondary | ICD-10-CM | POA: Diagnosis present

## 2017-09-21 DIAGNOSIS — D649 Anemia, unspecified: Secondary | ICD-10-CM | POA: Diagnosis not present

## 2017-09-21 DIAGNOSIS — C7951 Secondary malignant neoplasm of bone: Secondary | ICD-10-CM | POA: Diagnosis present

## 2017-09-21 DIAGNOSIS — Z853 Personal history of malignant neoplasm of breast: Secondary | ICD-10-CM | POA: Diagnosis not present

## 2017-09-21 DIAGNOSIS — R6520 Severe sepsis without septic shock: Secondary | ICD-10-CM | POA: Diagnosis present

## 2017-09-21 DIAGNOSIS — E119 Type 2 diabetes mellitus without complications: Secondary | ICD-10-CM | POA: Diagnosis present

## 2017-09-21 DIAGNOSIS — J441 Chronic obstructive pulmonary disease with (acute) exacerbation: Secondary | ICD-10-CM | POA: Diagnosis present

## 2017-09-21 DIAGNOSIS — E876 Hypokalemia: Secondary | ICD-10-CM | POA: Diagnosis present

## 2017-09-21 DIAGNOSIS — D638 Anemia in other chronic diseases classified elsewhere: Secondary | ICD-10-CM | POA: Diagnosis present

## 2017-09-21 DIAGNOSIS — K219 Gastro-esophageal reflux disease without esophagitis: Secondary | ICD-10-CM | POA: Diagnosis present

## 2017-09-21 DIAGNOSIS — Z7951 Long term (current) use of inhaled steroids: Secondary | ICD-10-CM | POA: Diagnosis not present

## 2017-09-21 DIAGNOSIS — Z9011 Acquired absence of right breast and nipple: Secondary | ICD-10-CM | POA: Diagnosis not present

## 2017-09-21 DIAGNOSIS — Z9221 Personal history of antineoplastic chemotherapy: Secondary | ICD-10-CM | POA: Diagnosis not present

## 2017-09-21 DIAGNOSIS — Z170 Estrogen receptor positive status [ER+]: Secondary | ICD-10-CM

## 2017-09-21 DIAGNOSIS — F17200 Nicotine dependence, unspecified, uncomplicated: Secondary | ICD-10-CM | POA: Diagnosis present

## 2017-09-21 DIAGNOSIS — Z9049 Acquired absence of other specified parts of digestive tract: Secondary | ICD-10-CM

## 2017-09-21 DIAGNOSIS — Z7982 Long term (current) use of aspirin: Secondary | ICD-10-CM | POA: Diagnosis not present

## 2017-09-21 DIAGNOSIS — Z79899 Other long term (current) drug therapy: Secondary | ICD-10-CM

## 2017-09-21 DIAGNOSIS — R0603 Acute respiratory distress: Secondary | ICD-10-CM

## 2017-09-21 DIAGNOSIS — J9621 Acute and chronic respiratory failure with hypoxia: Secondary | ICD-10-CM | POA: Diagnosis present

## 2017-09-21 DIAGNOSIS — T451X5A Adverse effect of antineoplastic and immunosuppressive drugs, initial encounter: Secondary | ICD-10-CM | POA: Diagnosis present

## 2017-09-21 DIAGNOSIS — Z8701 Personal history of pneumonia (recurrent): Secondary | ICD-10-CM

## 2017-09-21 DIAGNOSIS — C50919 Malignant neoplasm of unspecified site of unspecified female breast: Secondary | ICD-10-CM | POA: Diagnosis not present

## 2017-09-21 DIAGNOSIS — Z9981 Dependence on supplemental oxygen: Secondary | ICD-10-CM

## 2017-09-21 DIAGNOSIS — D6481 Anemia due to antineoplastic chemotherapy: Secondary | ICD-10-CM | POA: Diagnosis present

## 2017-09-21 DIAGNOSIS — Z801 Family history of malignant neoplasm of trachea, bronchus and lung: Secondary | ICD-10-CM

## 2017-09-21 DIAGNOSIS — A419 Sepsis, unspecified organism: Principal | ICD-10-CM | POA: Diagnosis present

## 2017-09-21 DIAGNOSIS — Z90710 Acquired absence of both cervix and uterus: Secondary | ICD-10-CM

## 2017-09-21 DIAGNOSIS — Z6822 Body mass index (BMI) 22.0-22.9, adult: Secondary | ICD-10-CM

## 2017-09-21 LAB — BLOOD CULTURE ID PANEL (REFLEXED)
Acinetobacter baumannii: NOT DETECTED
CANDIDA ALBICANS: NOT DETECTED
CANDIDA GLABRATA: NOT DETECTED
CANDIDA PARAPSILOSIS: NOT DETECTED
CANDIDA TROPICALIS: NOT DETECTED
Candida krusei: NOT DETECTED
Carbapenem resistance: NOT DETECTED
ENTEROBACTER CLOACAE COMPLEX: NOT DETECTED
ENTEROCOCCUS SPECIES: NOT DETECTED
ESCHERICHIA COLI: DETECTED — AB
Enterobacteriaceae species: DETECTED — AB
HAEMOPHILUS INFLUENZAE: NOT DETECTED
KLEBSIELLA PNEUMONIAE: NOT DETECTED
Klebsiella oxytoca: NOT DETECTED
LISTERIA MONOCYTOGENES: NOT DETECTED
METHICILLIN RESISTANCE: NOT DETECTED
Neisseria meningitidis: NOT DETECTED
PROTEUS SPECIES: NOT DETECTED
Pseudomonas aeruginosa: NOT DETECTED
SERRATIA MARCESCENS: NOT DETECTED
STREPTOCOCCUS PNEUMONIAE: NOT DETECTED
STREPTOCOCCUS PYOGENES: NOT DETECTED
Staphylococcus aureus (BCID): NOT DETECTED
Staphylococcus species: NOT DETECTED
Streptococcus agalactiae: NOT DETECTED
Streptococcus species: NOT DETECTED
Vancomycin resistance: NOT DETECTED

## 2017-09-21 LAB — CBC WITH DIFFERENTIAL/PLATELET
Basophils Absolute: 0 10*3/uL (ref 0–0.1)
Basophils Relative: 0 %
EOS ABS: 0 10*3/uL (ref 0–0.7)
Eosinophils Relative: 0 %
HEMATOCRIT: 25.6 % — AB (ref 35.0–47.0)
HEMOGLOBIN: 8.3 g/dL — AB (ref 12.0–16.0)
LYMPHS ABS: 0.7 10*3/uL — AB (ref 1.0–3.6)
LYMPHS PCT: 8 %
MCH: 28.9 pg (ref 26.0–34.0)
MCHC: 32.6 g/dL (ref 32.0–36.0)
MCV: 88.5 fL (ref 80.0–100.0)
MONOS PCT: 2 %
Monocytes Absolute: 0.2 10*3/uL (ref 0.2–0.9)
NEUTROS ABS: 8.9 10*3/uL — AB (ref 1.4–6.5)
NEUTROS PCT: 90 %
Platelets: 322 10*3/uL (ref 150–440)
RBC: 2.89 MIL/uL — AB (ref 3.80–5.20)
RDW: 16.8 % — ABNORMAL HIGH (ref 11.5–14.5)
WBC: 9.9 10*3/uL (ref 3.6–11.0)

## 2017-09-21 LAB — BLOOD GAS, VENOUS
Acid-Base Excess: 1.2 mmol/L (ref 0.0–2.0)
BICARBONATE: 26 mmol/L (ref 20.0–28.0)
O2 Saturation: 83 %
PCO2 VEN: 41 mmHg — AB (ref 44.0–60.0)
PO2 VEN: 47 mmHg — AB (ref 32.0–45.0)
Patient temperature: 37
pH, Ven: 7.41 (ref 7.250–7.430)

## 2017-09-21 LAB — URINALYSIS, COMPLETE (UACMP) WITH MICROSCOPIC
BILIRUBIN URINE: NEGATIVE
Bacteria, UA: NONE SEEN
GLUCOSE, UA: NEGATIVE mg/dL
HGB URINE DIPSTICK: NEGATIVE
KETONES UR: NEGATIVE mg/dL
LEUKOCYTES UA: NEGATIVE
NITRITE: NEGATIVE
PROTEIN: NEGATIVE mg/dL
RBC / HPF: NONE SEEN RBC/hpf (ref 0–5)
Specific Gravity, Urine: 1.014 (ref 1.005–1.030)
pH: 5 (ref 5.0–8.0)

## 2017-09-21 LAB — BASIC METABOLIC PANEL
Anion gap: 9 (ref 5–15)
BUN: 10 mg/dL (ref 6–20)
CHLORIDE: 103 mmol/L (ref 101–111)
CO2: 24 mmol/L (ref 22–32)
Calcium: 8.1 mg/dL — ABNORMAL LOW (ref 8.9–10.3)
Creatinine, Ser: 0.53 mg/dL (ref 0.44–1.00)
GFR calc Af Amer: >60 mL/min (ref >60)
GFR calc non Af Amer: >60 mL/min (ref >60)
Glucose, Bld: 131 mg/dL — ABNORMAL HIGH (ref 65–99)
POTASSIUM: 3.1 mmol/L — AB (ref 3.5–5.1)
SODIUM: 136 mmol/L (ref 135–145)

## 2017-09-21 LAB — INFLUENZA PANEL BY PCR (TYPE A & B)
Influenza A By PCR: NEGATIVE
Influenza B By PCR: NEGATIVE

## 2017-09-21 LAB — TROPONIN I

## 2017-09-21 LAB — LACTIC ACID, PLASMA
Lactic Acid, Venous: 0.9 mmol/L (ref 0.5–1.9)
Lactic Acid, Venous: 1.1 mmol/L (ref 0.5–1.9)

## 2017-09-21 LAB — BRAIN NATRIURETIC PEPTIDE: B NATRIURETIC PEPTIDE 5: 112 pg/mL — AB (ref 0.0–100.0)

## 2017-09-21 LAB — TSH: TSH: 1.644 u[IU]/mL (ref 0.350–4.500)

## 2017-09-21 MED ORDER — LORATADINE 10 MG PO TABS
10.0000 mg | ORAL_TABLET | Freq: Every day | ORAL | Status: DC
  Administered 2017-09-21 – 2017-09-23 (×3): 10 mg via ORAL
  Filled 2017-09-21 (×3): qty 1

## 2017-09-21 MED ORDER — SODIUM CHLORIDE 0.9 % IV SOLN
INTRAVENOUS | Status: DC
  Administered 2017-09-21 (×3): via INTRAVENOUS

## 2017-09-21 MED ORDER — FLUTICASONE PROPIONATE 50 MCG/ACT NA SUSP
2.0000 | Freq: Every day | NASAL | Status: DC
  Administered 2017-09-21 – 2017-09-23 (×2): 2 via NASAL
  Filled 2017-09-21: qty 16

## 2017-09-21 MED ORDER — MONTELUKAST SODIUM 10 MG PO TABS
10.0000 mg | ORAL_TABLET | Freq: Every day | ORAL | Status: DC
  Administered 2017-09-21 – 2017-09-22 (×2): 10 mg via ORAL
  Filled 2017-09-21 (×2): qty 1

## 2017-09-21 MED ORDER — TIOTROPIUM BROMIDE MONOHYDRATE 18 MCG IN CAPS
18.0000 ug | ORAL_CAPSULE | RESPIRATORY_TRACT | Status: DC
  Administered 2017-09-21 – 2017-09-23 (×3): 18 ug via RESPIRATORY_TRACT
  Filled 2017-09-21: qty 5

## 2017-09-21 MED ORDER — ENOXAPARIN SODIUM 40 MG/0.4ML ~~LOC~~ SOLN
40.0000 mg | SUBCUTANEOUS | Status: DC
  Administered 2017-09-21: 40 mg via SUBCUTANEOUS
  Filled 2017-09-21: qty 0.4

## 2017-09-21 MED ORDER — ERGOCALCIFEROL 1.25 MG (50000 UT) PO CAPS
50000.0000 [IU] | ORAL_CAPSULE | ORAL | Status: DC
  Administered 2017-09-23: 10:00:00 50000 [IU] via ORAL
  Filled 2017-09-21: qty 1

## 2017-09-21 MED ORDER — MAGNESIUM HYDROXIDE 400 MG/5ML PO SUSP: 30.0000 mL | Freq: Every day | ORAL | Status: DC | PRN

## 2017-09-21 MED ORDER — ONDANSETRON HCL 4 MG PO TABS
4.0000 mg | ORAL_TABLET | Freq: Four times a day (QID) | ORAL | Status: DC | PRN
Start: 2017-09-21 — End: 2017-09-23

## 2017-09-21 MED ORDER — MAGNESIUM CL-CALCIUM CARBONATE 70-117 MG PO TBEC: 1.0000 | DELAYED_RELEASE_TABLET | Freq: Two times a day (BID) | ORAL | Status: DC

## 2017-09-21 MED ORDER — MOMETASONE FURO-FORMOTEROL FUM 200-5 MCG/ACT IN AERO
2.0000 | INHALATION_SPRAY | Freq: Two times a day (BID) | RESPIRATORY_TRACT | Status: DC
  Administered 2017-09-21 – 2017-09-23 (×5): 2 via RESPIRATORY_TRACT
  Filled 2017-09-21: qty 8.8

## 2017-09-21 MED ORDER — PIPERACILLIN-TAZOBACTAM 3.375 G IVPB
3.3750 g | Freq: Three times a day (TID) | INTRAVENOUS | Status: DC
  Administered 2017-09-21: 09:00:00 3.375 g via INTRAVENOUS
  Filled 2017-09-21: qty 50

## 2017-09-21 MED ORDER — CALCIUM CARBONATE ANTACID 500 MG PO CHEW
1.0000 | CHEWABLE_TABLET | Freq: Every day | ORAL | Status: DC
  Administered 2017-09-21 – 2017-09-23 (×3): 200 mg via ORAL
  Filled 2017-09-21 (×3): qty 1

## 2017-09-21 MED ORDER — ASPIRIN 81 MG PO CHEW
81.0000 mg | CHEWABLE_TABLET | Freq: Every day | ORAL | Status: DC
  Administered 2017-09-21 – 2017-09-23 (×3): 81 mg via ORAL
  Filled 2017-09-21 (×3): qty 1

## 2017-09-21 MED ORDER — PIPERACILLIN-TAZOBACTAM 3.375 G IVPB 30 MIN
3.3750 g | Freq: Once | INTRAVENOUS | Status: AC
  Administered 2017-09-21: 3.375 g via INTRAVENOUS
  Filled 2017-09-21: qty 50

## 2017-09-21 MED ORDER — VANCOMYCIN HCL IN DEXTROSE 1-5 GM/200ML-% IV SOLN
1000.0000 mg | INTRAVENOUS | Status: DC
  Filled 2017-09-21: qty 200

## 2017-09-21 MED ORDER — DEXTROSE-NACL 5-0.45 % IV SOLN
INTRAVENOUS | Status: DC
  Administered 2017-09-21: 05:00:00 via INTRAVENOUS

## 2017-09-21 MED ORDER — IPRATROPIUM-ALBUTEROL 0.5-2.5 (3) MG/3ML IN SOLN
RESPIRATORY_TRACT | Status: AC
  Administered 2017-09-21: 6 mL
  Filled 2017-09-21: qty 6

## 2017-09-21 MED ORDER — POTASSIUM CHLORIDE CRYS ER 20 MEQ PO TBCR
20.0000 meq | EXTENDED_RELEASE_TABLET | Freq: Two times a day (BID) | ORAL | Status: DC
  Administered 2017-09-21 – 2017-09-23 (×5): 20 meq via ORAL
  Filled 2017-09-21 (×5): qty 1

## 2017-09-21 MED ORDER — MAGNESIUM OXIDE 400 (241.3 MG) MG PO TABS
400.0000 mg | ORAL_TABLET | Freq: Every day | ORAL | Status: DC
  Administered 2017-09-21 – 2017-09-23 (×3): 400 mg via ORAL
  Filled 2017-09-21 (×3): qty 1

## 2017-09-21 MED ORDER — METHYLPREDNISOLONE SODIUM SUCC 125 MG IJ SOLR
125.0000 mg | Freq: Once | INTRAMUSCULAR | Status: AC
  Administered 2017-09-21: 125 mg via INTRAVENOUS
  Filled 2017-09-21: qty 2

## 2017-09-21 MED ORDER — ATORVASTATIN CALCIUM 20 MG PO TABS
10.0000 mg | ORAL_TABLET | Freq: Every day | ORAL | Status: DC
  Administered 2017-09-21: 09:00:00 10 mg via ORAL
  Administered 2017-09-22: 14:00:00 via ORAL
  Administered 2017-09-23: 10:00:00 10 mg via ORAL
  Filled 2017-09-21 (×3): qty 1

## 2017-09-21 MED ORDER — LIDOCAINE-PRILOCAINE 2.5-2.5 % EX CREA
1.0000 "application " | TOPICAL_CREAM | CUTANEOUS | Status: DC | PRN
  Filled 2017-09-21: qty 5

## 2017-09-21 MED ORDER — SODIUM CHLORIDE 0.9 % IV BOLUS (SEPSIS)
1000.0000 mL | Freq: Once | INTRAVENOUS | Status: AC
  Administered 2017-09-21: 1000 mL via INTRAVENOUS

## 2017-09-21 MED ORDER — GUAIFENESIN ER 600 MG PO TB12
600.0000 mg | ORAL_TABLET | Freq: Two times a day (BID) | ORAL | Status: DC | PRN
  Filled 2017-09-21: qty 1

## 2017-09-21 MED ORDER — PANTOPRAZOLE SODIUM 40 MG PO TBEC
40.0000 mg | DELAYED_RELEASE_TABLET | Freq: Every day | ORAL | Status: DC
  Administered 2017-09-21 – 2017-09-23 (×3): 40 mg via ORAL
  Filled 2017-09-21 (×3): qty 1

## 2017-09-21 MED ORDER — SODIUM CHLORIDE 0.9 % IV SOLN
1.0000 g | Freq: Three times a day (TID) | INTRAVENOUS | Status: DC
  Administered 2017-09-21 – 2017-09-23 (×5): 1 g via INTRAVENOUS
  Filled 2017-09-21 (×7): qty 1

## 2017-09-21 MED ORDER — METOCLOPRAMIDE HCL 5 MG PO TABS
10.0000 mg | ORAL_TABLET | Freq: Four times a day (QID) | ORAL | Status: DC
  Administered 2017-09-21 – 2017-09-23 (×7): 10 mg via ORAL
  Filled 2017-09-21 (×8): qty 2

## 2017-09-21 MED ORDER — ACETAMINOPHEN 325 MG PO TABS
650.0000 mg | ORAL_TABLET | Freq: Four times a day (QID) | ORAL | Status: DC | PRN
  Administered 2017-09-21 – 2017-09-22 (×3): 650 mg via ORAL
  Filled 2017-09-21 (×3): qty 2

## 2017-09-21 MED ORDER — VANCOMYCIN HCL IN DEXTROSE 1-5 GM/200ML-% IV SOLN
1000.0000 mg | Freq: Once | INTRAVENOUS | Status: AC
  Administered 2017-09-21: 1000 mg via INTRAVENOUS
  Filled 2017-09-21: qty 200

## 2017-09-21 MED ORDER — ENSURE ENLIVE PO LIQD
237.0000 mL | Freq: Three times a day (TID) | ORAL | Status: DC
  Administered 2017-09-21 – 2017-09-22 (×4): 237 mL via ORAL

## 2017-09-21 MED ORDER — SENNOSIDES-DOCUSATE SODIUM 8.6-50 MG PO TABS: 1.0000 | ORAL_TABLET | Freq: Every evening | ORAL | Status: DC | PRN

## 2017-09-21 MED ORDER — OXYCODONE-ACETAMINOPHEN 5-325 MG PO TABS
1.0000 | ORAL_TABLET | Freq: Three times a day (TID) | ORAL | Status: DC | PRN
Start: 2017-09-21 — End: 2017-09-23
  Administered 2017-09-21: 07:00:00 1 via ORAL
  Filled 2017-09-21: qty 1

## 2017-09-21 MED ORDER — DOCUSATE SODIUM 100 MG PO CAPS
100.0000 mg | ORAL_CAPSULE | Freq: Two times a day (BID) | ORAL | Status: DC
  Administered 2017-09-21 – 2017-09-23 (×5): 100 mg via ORAL
  Filled 2017-09-21 (×5): qty 1

## 2017-09-21 MED ORDER — ACETAMINOPHEN 650 MG RE SUPP
650.0000 mg | Freq: Four times a day (QID) | RECTAL | Status: DC | PRN
Start: 2017-09-21 — End: 2017-09-23
  Filled 2017-09-21: qty 1

## 2017-09-21 MED ORDER — ONDANSETRON HCL 4 MG/2ML IJ SOLN: 4.0000 mg | Freq: Four times a day (QID) | INTRAMUSCULAR | Status: DC | PRN

## 2017-09-21 MED ORDER — LEVOFLOXACIN IN D5W 500 MG/100ML IV SOLN
500.0000 mg | INTRAVENOUS | Status: DC
  Administered 2017-09-21: 17:00:00 500 mg via INTRAVENOUS
  Filled 2017-09-21 (×2): qty 100

## 2017-09-21 NOTE — ED Triage Notes (Signed)
Pt has been at home with difficultly breathing x a few hours. Wears 3L of O2 at home she increased it to 4L on her own. Pt is having chemo for bilateral breast cancer

## 2017-09-21 NOTE — ED Notes (Signed)
Pt states she feels breathing is easier after duoneb

## 2017-09-21 NOTE — Progress Notes (Signed)
CODE SEPSIS - PHARMACY COMMUNICATION  **Broad Spectrum Antibiotics should be administered within 1 hour of Sepsis diagnosis**  Time Code Sepsis Called/Page Received: 1/26 0344   Antibiotics Ordered: vanc/Zosyn 1/26 0402  Time of 1st antibiotic administration: 1/26 0428  Additional action taken by pharmacy: n/a  If necessary, Name of Provider/Nurse Contacted: n/a    Eloise Harman ,PharmD Clinical Pharmacist  09/21/2017  4:36 AM

## 2017-09-21 NOTE — H&P (Signed)
Sophia Nelson is an 72 y.o. female.   Chief Complaint: Shortness of breath HPI: The patient with past medical history of metastatic breast cancer, COPD and diabetes resents to the emergency department with shortness of breath.  The patient states that she has had a nonproductive cough and all of a sudden was unable to breathe this evening.  She reports that she gets pneumonia every year and that she thinks this may be similar.  She is chronically on 4 L of oxygen at home.  She received multiple breathing treatments in the emergency department to improve her respiratory status after which the emergency department staff called the hospitalist service for other management.  Past Medical History:  Diagnosis Date  . Abdominal distention   . AKI (acute kidney injury) ([place]) 02/24/2017  . Anemia   . Arthritis   . Asthma   . Back pain, chronic 03/15/2017  . Breast cancer ([place]) 2009   RT MASTECTOMY, DCIS  . 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 Cancer of left breast ([place]) 08/09/2015   T4 N1 M1 tumor, INVASIVE LOBULAR CARCINOMA.  . Carcinoma of overlapping sites of left breast in female, estrogen receptor positive ([place]) 03/22/2016  . COPD (chronic obstructive pulmonary disease) ([place])   . Diabetes ([place]) 02/24/2017  . Diabetes mellitus without complication ([place])   . Duodenal obstruction   . Encounter for nasogastric (NG) tube placement   . Gastric outlet obstruction 02/24/2017  . GERD (gastroesophageal reflux disease)   . Headache    h/o migraines as a child  . Hypertension   . Neck pain 04/19/2016  . Pancreatitis, acute 03/04/2017  . Pneumonia 2015  . Recurrent low back pain 03/15/2017  . Shortness of breath dyspnea    with exertion    Past Surgical History:  Procedure Laterality Date  . ABDOMINAL HYSTERECTOMY    . BREAST SURGERY Right 2015   mastectomy  . ESOPHAGOGASTRODUODENOSCOPY N/A 02/25/2017   Procedure: ESOPHAGOGASTRODUODENOSCOPY (EGD);  Surgeon: Jonathon Bellows, MD;  Location: [facility] ENDOSCOPY;  Service: Endoscopy;  Laterality: N/A;  . ESOPHAGOGASTRODUODENOSCOPY (EGD) WITH PROPOFOL N/A 03/06/2017   Procedure: ESOPHAGOGASTRODUODENOSCOPY (EGD) WITH PROPOFOL;  Surgeon: [name], MD;  Location: [facility] ENDOSCOPY;  Service: Endoscopy;  Laterality: N/A;  . GASTROJEJUNOSTOMY N/A 03/08/2017   Procedure: Roux-en-Y gastrojejunostomy Bypass of Gastric Outlet Obstruction, small bowel resection;  Surgeon: Vickie Epley, MD;  Location: [facility] ORS;  Service: General;  Laterality: N/A;  . PORTACATH PLACEMENT Right 08/01/2017   Procedure: INSERTION PORT-A-CATH;  Surgeon: Christene Lye, MD;  Location: [facility] ORS;  Service: General;  Laterality: Right;  . SIMPLE MASTECTOMY WITH AXILLARY SENTINEL NODE BIOPSY Left 02/29/2016   Procedure: SIMPLE MASTECTOMY;  Surgeon: Christene Lye, MD;  Location: [facility] ORS;  Service: General;  Laterality: Left;  . TUBAL LIGATION      Family History  Problem Relation Age of Onset  . Lung cancer Father   . Cancer Maternal Aunt   . Breast cancer Neg Hx    Social History:  reports that she quit smoking about 1 months ago. Her smoking use included cigarettes. She has a 3.75 pack-year smoking history. she has never used smokeless tobacco. She reports that she does not drink alcohol or use drugs.  Allergies:  Allergies  Allergen Reactions  . Other Other (See Comments)    Onions(that grow in the yard-pt can eat onions without problems) and dust mite Sneezing, cough, runny  nose     (Not in a hospital admission)  Results for orders placed or performed during the hospital encounter of 09/21/17 (from the past 48 hour(s))  CBC with Differential     Status: Abnormal   Collection Time: 09/21/17  3:25 AM  Result Value Ref Range   WBC 9.9 3.6 - 11.0 K/uL   RBC 2.89 (L) 3.80 - 5.20 MIL/uL   Hemoglobin 8.3 (L) 12.0 - 16.0 g/dL   HCT 25.6 (L) 35.0 - 47.0 %   MCV 88.5  80.0 - 100.0 fL   MCH 28.9 26.0 - 34.0 pg   MCHC 32.6 32.0 - 36.0 g/dL   RDW 16.8 (H) 11.5 - 14.5 %   Platelets 322 150 - 440 K/uL   Neutrophils Relative % 90 %   Neutro Abs 8.9 (H) 1.4 - 6.5 K/uL   Lymphocytes Relative 8 %   Lymphs Abs 0.7 (L) 1.0 - 3.6 K/uL   Monocytes Relative 2 %   Monocytes Absolute 0.2 0.2 - 0.9 K/uL   Eosinophils Relative 0 %   Eosinophils Absolute 0.0 0 - 0.7 K/uL   Basophils Relative 0 %   Basophils Absolute 0.0 0 - 0.1 K/uL    Comment: Performed at [facility], [address] [place], [place] [address]  Basic metabolic panel     Status: Abnormal   Collection Time: 09/21/17  3:25 AM  Result Value Ref Range   Sodium 136 135 - 145 mmol/L   Potassium 3.1 (L) 3.5 - 5.1 mmol/L   Chloride 103 101 - 111 mmol/L   CO2 24 22 - 32 mmol/L   Glucose, Bld 131 (H) 65 - 99 mg/dL   BUN 10 6 - 20 mg/dL   Creatinine, Ser 0.53 0.44 - 1.00 mg/dL   Calcium 8.1 (L) 8.9 - 10.3 mg/dL   GFR calc non Af Amer >60 >60 mL/min   GFR calc Af Amer >60 >60 mL/min    Comment: (NOTE) The eGFR has been calculated using the CKD EPI equation. This calculation has not been validated in all clinical situations. eGFR's persistently <60 mL/min signify possible Chronic Kidney Disease.    Anion gap 9 5 - 15    Comment: Performed at [facility], [address] [place], [place] [address]  Brain natriuretic peptide     Status: Abnormal   Collection Time: 09/21/17  3:25 AM  Result Value Ref Range   B Natriuretic Peptide 112.0 (H) 0.0 - 100.0 pg/mL    Comment: Performed at [facility], [address] [place], [place] [address]  Troponin I     Status: None   Collection Time: 09/21/17  3:25 AM  Result Value Ref Range   Troponin I <0.03 <0.03 ng/mL    Comment: Performed at [facility], [address] [place], [place] [address]  Lactic acid, plasma     Status: None   Collection Time: 09/21/17  3:25 AM  Result Value Ref Range   Lactic Acid, Venous  0.9 0.5 - 1.9 mmol/L    Comment: Performed at [facility], [address] [place], [place] [address]  Blood gas, venous     Status: Abnormal   Collection Time: 09/21/17  3:32 AM  Result Value Ref Range   pH, Ven 7.41 7.250 - 7.430   pCO2, Ven 41 (L) 44.0 - 60.0 mmHg   pO2, Ven 47.0 (H) 32.0 - 45.0 mmHg   Bicarbonate 26.0 20.0 - 28.0 mmol/L   Acid-Base Excess  1.2 0.0 - 2.0 mmol/L   O2 Saturation 83.0 %   Patient temperature 37.0    Collection site VENOUS    Sample type VENOUS     Comment: Performed at [facility], [address] [place], [place] [address]  Urinalysis, Complete w Microscopic     Status: Abnormal   Collection Time: 09/21/17  3:44 AM  Result Value Ref Range   Color, Urine YELLOW (A) YELLOW   APPearance CLEAR (A) CLEAR   Specific Gravity, Urine 1.014 1.005 - 1.030   pH 5.0 5.0 - 8.0   Glucose, UA NEGATIVE NEGATIVE mg/dL   Hgb urine dipstick NEGATIVE NEGATIVE   Bilirubin Urine NEGATIVE NEGATIVE   Ketones, ur NEGATIVE NEGATIVE mg/dL   Protein, ur NEGATIVE NEGATIVE mg/dL   Nitrite NEGATIVE NEGATIVE   Leukocytes, UA NEGATIVE NEGATIVE   RBC / HPF NONE SEEN 0 - 5 RBC/hpf   WBC, UA 0-5 0 - 5 WBC/hpf   Bacteria, UA NONE SEEN NONE SEEN   Squamous Epithelial / LPF 0-5 (A) NONE SEEN   Mucus PRESENT     Comment: Performed at [facility], [address], [place],  [address]   Dg Chest Port 1 View  Result Date: 09/21/2017 CLINICAL DATA:  Difficulty breathing EXAM: PORTABLE CHEST 1 VIEW COMPARISON:  08/08/2017, 08/07/2017 FINDINGS: Right-sided central venous port tip overlies the proximal right atrium. No focal pulmonary infiltrate or effusion. Stable chronic interstitial opacities at the bases. Normal heart size. Aortic atherosclerosis. No pneumothorax. IMPRESSION: Stable scarring and chronic interstitial changes at the bases. No acute infiltrate or edema. Electronically Signed   By: Donavan Foil M.D.   On: 09/21/2017 03:43    Review  of Systems  Constitutional: Negative for chills and fever.  HENT: Negative for sore throat and tinnitus.   Eyes: Negative for blurred vision and redness.  Respiratory: Positive for cough and shortness of breath.   Cardiovascular: Negative for chest pain, palpitations, orthopnea and PND.  Gastrointestinal: Negative for abdominal pain, diarrhea, nausea and vomiting.  Genitourinary: Negative for dysuria, frequency and urgency.  Musculoskeletal: Negative for joint pain and myalgias.  Skin: Negative for rash.       No lesions  Neurological: Negative for speech change, focal weakness and weakness.  Endo/Heme/Allergies: Does not bruise/bleed easily.       No temperature intolerance  Psychiatric/Behavioral: Negative for depression and suicidal ideas.    Blood pressure 113/61, pulse (!) 115, temperature 98.6 F (37 C), temperature source Oral, resp. rate 18, height 5' 4"  (1.626 m), weight 55.3 kg (122 lb), SpO2 100 %. Physical Exam  Vitals reviewed. Constitutional: She is oriented to person, place, and time. She appears well-developed and well-nourished. No distress.  HENT:  Head: Normocephalic and atraumatic.  Mouth/Throat: Oropharynx is clear and moist.  Eyes: Conjunctivae and EOM are normal. Pupils are equal, round, and reactive to light. No scleral icterus.  Neck: Normal range of motion. Neck supple. No JVD present. No tracheal deviation present. No thyromegaly present.  Cardiovascular: Normal rate, regular rhythm and normal heart sounds. Exam reveals no gallop and no friction rub.  No murmur heard. Respiratory: Effort normal. No respiratory distress. She has wheezes (faint, musical).  GI: Soft. Bowel sounds are normal. She exhibits no distension. There is no tenderness.  Genitourinary:  Genitourinary Comments: Deferred  Musculoskeletal: Normal range of motion. She exhibits edema.  Lymphadenopathy:    She has no cervical adenopathy.  Neurological: She is alert and oriented to person,  place, and time.  No cranial nerve deficit. She exhibits normal muscle tone.  Skin: Skin is warm and dry. No rash noted. No erythema.  Psychiatric: She has a normal mood and affect. Her behavior is normal. Judgment and thought content normal.     Assessment/Plan This is a 72 year old female admitted for respiratory failure. 1.  Sepsis: The patient meets criteria via fever, tachycardia and tachypnea.  Her hypotension is improving.  Hydrate aggressively with intravenous fluids. Follow blood cultures for growth and sensitivities.  Follow lactic acid.  Continue vancomycin and Zosyn. 2.  Respiratory failure: Acute on chronic; with hypoxia.  Possible developing pneumonia.  Differential diagnosis includes COPD exacerbation. 3.  COPD: Uncontrolled; the patient continues to smoke as well.  Continue inhaled corticosteroid.  Continue Solu-Medrol.  Schedule DuoNeb treatments. 4.  Hypokalemia: Replete potassium  5.  Metastatic breast cancer: With lytic lesions of the spine.  The patient is currently undergoing chemotherapy. 6.  DVT prophylaxis: Lovenox 7.  GI prophylaxis: PPI per home regimen The patient is a full code.  Time spent on admission orders and patient care approximately 45 minutes.    Harrie Foreman, MD 09/21/2017, 4:39 AM

## 2017-09-21 NOTE — ED Notes (Signed)
Pt not going to ICU.

## 2017-09-21 NOTE — Plan of Care (Signed)
  Education: Knowledge of General Education information will improve 09/21/2017 2015 - Progressing by Herbie Baltimore, RN   Health Behavior/Discharge Planning: Ability to manage health-related needs will improve 09/21/2017 2015 - Progressing by Herbie Baltimore, RN   Clinical Measurements: Ability to maintain clinical measurements within normal limits will improve 09/21/2017 2015 - Progressing by Herbie Baltimore, RN Will remain free from infection 09/21/2017 2015 - Progressing by Herbie Baltimore, RN Diagnostic test results will improve 09/21/2017 2015 - Progressing by Herbie Baltimore, RN Respiratory complications will improve 09/21/2017 2015 - Progressing by Herbie Baltimore, RN Cardiovascular complication will be avoided 09/21/2017 2015 - Progressing by Herbie Baltimore, RN   Activity: Risk for activity intolerance will decrease 09/21/2017 2015 - Progressing by Herbie Baltimore, RN   Nutrition: Adequate nutrition will be maintained 09/21/2017 2015 - Progressing by Herbie Baltimore, RN   Coping: Level of anxiety will decrease 09/21/2017 2015 - Progressing by Herbie Baltimore, RN   Elimination: Will not experience complications related to bowel motility 09/21/2017 2015 - Progressing by Herbie Baltimore, RN Will not experience complications related to urinary retention 09/21/2017 2015 - Progressing by Herbie Baltimore, RN   Pain Managment: General experience of comfort will improve 09/21/2017 2015 - Progressing by Herbie Baltimore, RN   Safety: Ability to remain free from injury will improve 09/21/2017 2015 - Progressing by Herbie Baltimore, RN   Skin Integrity: Risk for impaired skin integrity will decrease 09/21/2017 2015 - Progressing by Herbie Baltimore, RN

## 2017-09-21 NOTE — Progress Notes (Signed)
[place] at [facility]                                                                                                                                                                                  Patient Demographics   Sophia Nelson, is a 72 y.o. female, DOB - [birth year], TSV:[id number]  Admit date - 09/21/2017   Admitting Physician Harrie Foreman, MD  Outpatient Primary MD for the patient is Aycock, Edmonia Lynch, MD   LOS - 0  Subjective: Pt admited with sob has improvement in her breathing Had abdominal pain earlier now improved    Review of Systems:   CONSTITUTIONAL: No documented fever. No fatigue, weakness. No weight gain, no weight loss.  EYES: No blurry or double vision.  ENT: No tinnitus. No postnasal drip. No redness of the oropharynx.  RESPIRATORY: No cough, no wheeze, no hemoptysis.  Positive dyspnea.  CARDIOVASCULAR: No chest pain. No orthopnea. No palpitations. No syncope.  GASTROINTESTINAL: No nausea, no vomiting or diarrhea. No abdominal pain. No melena or hematochezia.  GENITOURINARY: No dysuria or hematuria.  ENDOCRINE: No polyuria or nocturia. No heat or cold intolerance.  HEMATOLOGY: No anemia. No bruising. No bleeding.  INTEGUMENTARY: No rashes. No lesions.  MUSCULOSKELETAL: No arthritis. No swelling. No gout.  NEUROLOGIC: No numbness, tingling, or ataxia. No seizure-type activity.  PSYCHIATRIC: No anxiety. No insomnia. No ADD.    Vitals:   Vitals:   09/21/17 0500 09/21/17 0530 09/21/17 0545 09/21/17 0647  BP: (!) 110/55 (!) 102/52  (!) 104/56  Pulse: (!) 120 (!) 113 (!) 113 (!) 104  Resp: (!) 23 16 17  (!) 24  Temp:    100.2 F (37.9 C)  TempSrc:    Oral  SpO2: 100% 100% 100% 100%  Weight:    122 lb 3.2 oz (55.4 kg)  Height:    5\' 2"  (1.575 m)    Wt Readings from Last 3 Encounters:  09/21/17 122 lb 3.2 oz (55.4 kg)  09/13/17 122 lb 12.7 oz (55.7 kg)  09/06/17 115 lb (52.2 kg)     Intake/Output Summary (Last 24  hours) at 09/21/2017 1351 Last data filed at 09/21/2017 0547 Gross per 24 hour  Intake 2200 ml  Output -  Net 2200 ml    Physical Exam:   GENERAL: Pleasant-appearing in no apparent distress.  HEAD, EYES, EARS, NOSE AND THROAT: Atraumatic, normocephalic. Extraocular muscles are intact. Pupils equal and reactive to light. Sclerae anicteric. No conjunctival injection. No oro-pharyngeal erythema.  NECK: Supple. There is no jugular venous distention. No bruits, no lymphadenopathy, no thyromegaly.  HEART: Regular rate and rhythm,. No murmurs, no rubs, no clicks.  LUNGS:  Right lung rhonchus breath sounds.  ABDOMEN: Soft, flat, nontender, nondistended. Has good bowel sounds. No hepatosplenomegaly appreciated.  EXTREMITIES: No evidence of any cyanosis, clubbing, or peripheral edema.  +2 pedal and radial pulses bilaterally.  NEUROLOGIC: The patient is alert, awake, and oriented x3 with no focal motor or sensory deficits appreciated bilaterally.  SKIN: Moist and warm with no rashes appreciated.  Psych: Not anxious, depressed LN: No inguinal LN enlargement    Antibiotics   Anti-infectives (From admission, onward)   Start     Dose/Rate Route Frequency Ordered Stop   09/21/17 1600  vancomycin (VANCOCIN) IVPB 1000 mg/200 mL premix  Status:  Discontinued     1,000 mg 200 mL/hr over 60 Minutes Intravenous Every 18 hours 09/21/17 0448 09/21/17 1257   09/21/17 1300  levofloxacin (LEVAQUIN) IVPB 500 mg     500 mg 100 mL/hr over 60 Minutes Intravenous Every 24 hours 09/21/17 1257     09/21/17 1000  piperacillin-tazobactam (ZOSYN) IVPB 3.375 g  Status:  Discontinued     3.375 g 12.5 mL/hr over 240 Minutes Intravenous Every 8 hours 09/21/17 0448 09/21/17 1257   09/21/17 0415  vancomycin (VANCOCIN) IVPB 1000 mg/200 mL premix     1,000 mg 200 mL/hr over 60 Minutes Intravenous  Once 09/21/17 0402 09/21/17 0547   09/21/17 0415  piperacillin-tazobactam (ZOSYN) IVPB 3.375 g     3.375 g 100 mL/hr over 30  Minutes Intravenous  Once 09/21/17 0402 09/21/17 0455      Medications   Scheduled Meds: . aspirin  81 mg Oral Daily  . atorvastatin  10 mg Oral Daily  . calcium carbonate  1 tablet Oral Daily  . docusate sodium  100 mg Oral BID  . enoxaparin (LOVENOX) injection  40 mg Subcutaneous Q24H  . [START ON 09/23/2017] ergocalciferol  50,000 Units Oral Q Mon  . feeding supplement (ENSURE ENLIVE)  237 mL Oral TID BM  . fluticasone  2 spray Each Nare Daily  . loratadine  10 mg Oral Daily  . magnesium oxide  400 mg Oral Daily  . metoCLOPramide  10 mg Oral QID  . mometasone-formoterol  2 puff Inhalation BID  . montelukast  10 mg Oral QHS  . pantoprazole  40 mg Oral Daily  . potassium chloride SA  20 mEq Oral BID  . tiotropium  18 mcg Inhalation [id number]   Continuous Infusions: . sodium chloride 100 mL/hr at 09/21/17 1043  . levofloxacin (LEVAQUIN) IV     PRN Meds:.acetaminophen **OR** acetaminophen, guaiFENesin, lidocaine-prilocaine, magnesium hydroxide, ondansetron **OR** ondansetron (ZOFRAN) IV, oxyCODONE-acetaminophen, senna-docusate   Data Review:   Micro Results Recent Results (from the past 240 hour(s))  Blood Culture (routine x 2)     Status: None (Preliminary result)   Collection Time: 09/21/17  3:44 AM  Result Value Ref Range Status   Specimen Description BLOOD PORTA CATH  Final   Special Requests   Final    BOTTLES DRAWN AEROBIC AND ANAEROBIC Blood Culture results may not be optimal due to an excessive volume of blood received in culture bottles   Culture   Final    NO GROWTH < 12 HOURS Performed at [facility], [address] [place], [place] [address]    Report Status PENDING  Incomplete  Blood Culture (routine x 2)     Status: None (Preliminary result)   Collection Time: 09/21/17  4:33 AM  Result Value Ref Range Status   Specimen Description BLOOD LEFT ANTECUBITAL  Final   Special  Requests   Final    BOTTLES DRAWN AEROBIC AND ANAEROBIC Blood Culture  adequate volume   Culture   Final    NO GROWTH <12 HOURS Performed at [facility], [address] [place], [place] [address]    Report Status PENDING  Incomplete    Radiology Reports Dg Chest Port 1 View  Result Date: 09/21/2017 CLINICAL DATA:  Difficulty breathing EXAM: PORTABLE CHEST 1 VIEW COMPARISON:  08/08/2017, 08/07/2017 FINDINGS: Right-sided central venous port tip overlies the proximal right atrium. No focal pulmonary infiltrate or effusion. Stable chronic interstitial opacities at the bases. Normal heart size. Aortic atherosclerosis. No pneumothorax. IMPRESSION: Stable scarring and chronic interstitial changes at the bases. No acute infiltrate or edema. Electronically Signed   By: Donavan Foil M.D.   On: 09/21/2017 03:43     CBC Recent Labs  Lab 09/20/17 0840 09/21/17 0325  WBC 5.1 9.9  HGB 8.2* 8.3*  HCT 25.0* 25.6*  PLT 348 322  MCV 89.4 88.5  MCH 29.4 28.9  MCHC 32.9 32.6  RDW 16.5* 16.8*  LYMPHSABS 1.1 0.7*  MONOABS 0.3 0.2  EOSABS 0.1 0.0  BASOSABS 0.1 0.0    Chemistries  Recent Labs  Lab 09/20/17 0840 09/21/17 0325  NA 139 136  K 3.7 3.1*  CL 106 103  CO2 27 24  GLUCOSE 101* 131*  BUN 11 10  CREATININE 0.50 0.53  CALCIUM 8.3* 8.1*   ------------------------------------------------------------------------------------------------------------------ estimated creatinine clearance is 52.5 mL/min (by C-G formula based on SCr of 0.53 mg/dL). ------------------------------------------------------------------------------------------------------------------ No results for input(s): HGBA1C in the last 72 hours. ------------------------------------------------------------------------------------------------------------------ No results for input(s): CHOL, HDL, LDLCALC, TRIG, CHOLHDL, LDLDIRECT in the last 72 hours. ------------------------------------------------------------------------------------------------------------------ Recent Labs     09/21/17 0325  TSH 1.644   ------------------------------------------------------------------------------------------------------------------ No results for input(s): VITAMINB12, FOLATE, FERRITIN, TIBC, IRON, RETICCTPCT in the last 72 hours.  Coagulation profile No results for input(s): INR, PROTIME in the last 168 hours.  No results for input(s): DDIMER in the last 72 hours.  Cardiac Enzymes Recent Labs  Lab 09/21/17 0325  TROPONINI <0.03   ------------------------------------------------------------------------------------------------------------------ Invalid input(s): POCBNP    Assessment & Plan   This is a 72 year old female admitted for respiratory failure. 1.  Sepsis: Source unclear chest x-ray urinalysis negative I will change antibiotics to IV Levaquin 2.  Respiratory failure: Acute on chronic; with hypoxia.  Possible developing pneumonia.  Differential diagnosis includes COPD exacerbation. 3.    Acute on chronic COPD exasperation treat with nebulizer steroids and antibiotics 4.  Hypokalemia: Replete potassium , recheck in the morning 5.  Metastatic breast cancer: With lytic lesions of the spine.  The patient is currently undergoing chemotherapy. 6.  DVT prophylaxis: Lovenox 7.  GI prophylaxis: PPI per home regimen         Code Status Orders  (From admission, onward)        Start     Ordered   09/21/17 0633  Full code  Continuous     09/21/17 0632    Code Status History    Date Active Date Inactive Code Status Order ID Comments User Context   08/08/2017 01:34 08/10/2017 00:29 Full Code [id number]  Lance Coon, MD Inpatient   06/09/2017 03:31 06/17/2017 17:15 Full Code [id number]  Harvie Bridge, DO ED   03/04/2017 01:23 03/13/2017 20:56 Full Code [id number]  Harvie Bridge, DO Inpatient   02/24/2017 23:40 02/26/2017 17:06 Full Code [id number]  Lance Coon, MD Inpatient           Consults  none  DVT Prophylaxis  Lovenox   Lab Results   Component Value Date   PLT 322 09/21/2017     Time Spent in minutes 35 minutes greater than 50% of time spent in care coordination and counseling patient regarding the condition and plan of care.   Dustin Flock M.D on 09/21/2017 at 1:51 PM  Between 7am to 6pm - Pager - [phone]  After 6pm go to [email or web address] - password [email or web address] [place]   Office  [phone]

## 2017-09-21 NOTE — Progress Notes (Signed)
PHARMACY - PHYSICIAN COMMUNICATION CRITICAL VALUE ALERT - BLOOD CULTURE IDENTIFICATION (BCID)  No results found for this or any previous visit.  09/21/17 20:05 lab called to report BCID in anaerobic bottle of 1 set (1 of 4 bottles) showing GNR enterobacteriaceae E. Coli, KPC not detected. Paged on-call prescriber x 1. Patient is currently on levofloxacin 500 mg IV Q24H for CAP.  Name of physician (or Provider) Contacted: Dr. Jerelyn Charles  Changes to prescribed antibiotics required: Okay to switch to meropenem.  Laural Benes, Pharm.D., BCPS Clinical Pharmacist 09/21/2017  8:07 PM

## 2017-09-21 NOTE — ED Provider Notes (Signed)
[facility] Emergency Department Provider Note    First MD Initiated Contact with Patient 09/21/17 0310     (approximate)  I have reviewed the triage vital signs and the nursing notes.   HISTORY  Chief Complaint Shortness of Breath    HPI Sophia Nelson is a 72 y.o. female with blows chronic medical conditions including COPD and metastatic breast cancer presents emergency department with acute onset of progressive dyspnea which began tonight. Patient denies any fever but does admit to a nonproductive cough. She denies any chest pain. Patient does admit to bilateral lower extremity swelling which has progressively worsened over the past 3 days. Patient presents via EMS to Tachycardic and hypoxic of note patient is on 4 L oxygen via nasal cannula at home   Past Medical History:  Diagnosis Date  . Abdominal distention   . AKI (acute kidney injury) ([place]) 02/24/2017  . Anemia   . Arthritis   . Asthma   . Back pain, chronic 03/15/2017  . Breast cancer ([place]) 2009   RT MASTECTOMY, DCIS  . 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 Cancer of left breast ([place]) 08/09/2015   T4 N1 M1 tumor, INVASIVE LOBULAR CARCINOMA.  . Carcinoma of overlapping sites of left breast in female, estrogen receptor positive ([place]) 03/22/2016  . COPD (chronic obstructive pulmonary disease) ([place])   . Diabetes ([place]) 02/24/2017  . Diabetes mellitus without complication ([place])   . Duodenal obstruction   . Encounter for nasogastric (NG) tube placement   . Gastric outlet obstruction 02/24/2017  . GERD (gastroesophageal reflux disease)   . Headache    h/o migraines as a child  . Hypertension   . Neck pain 04/19/2016  . Pancreatitis, acute 03/04/2017  . Pneumonia 2015  . Recurrent low back pain 03/15/2017  . Shortness of breath dyspnea    with exertion    Patient Active Problem List   Diagnosis Date  Noted  . Cholecystitis 08/07/2017  . Syncope   . Metastatic breast cancer ([place])   . Hypotension   . Palliative care by specialist   . Goals of care, counseling/discussion   . Severe sepsis with septic shock ([place]) 06/09/2017  . Hypocalcemia 05/30/2017  . Metastasis to bone ([place]) 04/30/2017  . Acute back pain 03/28/2017  . Back pain, chronic 03/15/2017  . Chronic lumbar pain 03/15/2017  . Recurrent low back pain 03/15/2017  . Duodenal obstruction   . Pancreatitis, acute 03/04/2017  . Abdominal distention   . Encounter for nasogastric (NG) tube placement   . Gastric outlet obstruction 02/24/2017  . AKI (acute kidney injury) ([place]) 02/24/2017  . GERD (gastroesophageal reflux disease) 02/24/2017  . Diabetes ([place]) 02/24/2017  . COPD (chronic obstructive pulmonary disease) ([place]) 02/24/2017  . Neck pain 04/19/2016  . Carcinoma of overlapping sites of left breast in female, estrogen receptor positive ([place]) 03/22/2016    Past Surgical History:  Procedure Laterality Date  . ABDOMINAL HYSTERECTOMY    . BREAST SURGERY Right 2015   mastectomy  . ESOPHAGOGASTRODUODENOSCOPY N/A 02/25/2017   Procedure: ESOPHAGOGASTRODUODENOSCOPY (EGD);  Surgeon: Jonathon Bellows, MD;  Location: [facility] ENDOSCOPY;  Service: Endoscopy;  Laterality: N/A;  . ESOPHAGOGASTRODUODENOSCOPY (EGD) WITH PROPOFOL N/A 03/06/2017   Procedure: ESOPHAGOGASTRODUODENOSCOPY (EGD) WITH PROPOFOL;  Surgeon: [name], MD;  Location: [facility] ENDOSCOPY;  Service: Endoscopy;  Laterality: N/A;  . GASTROJEJUNOSTOMY N/A 03/08/2017   Procedure: Roux-en-Y gastrojejunostomy Bypass of Gastric  Outlet Obstruction, small bowel resection;  Surgeon: Vickie Epley, MD;  Location: [facility] ORS;  Service: General;  Laterality: N/A;  . PORTACATH PLACEMENT Right 08/01/2017   Procedure: INSERTION PORT-A-CATH;  Surgeon: Christene Lye, MD;  Location: [facility] ORS;  Service: General;  Laterality: Right;  . SIMPLE MASTECTOMY WITH AXILLARY SENTINEL NODE BIOPSY Left  02/29/2016   Procedure: SIMPLE MASTECTOMY;  Surgeon: Christene Lye, MD;  Location: [facility] ORS;  Service: General;  Laterality: Left;  . TUBAL LIGATION      Prior to Admission medications   Medication Sig Start Date End Date Taking? Authorizing Provider  ADVAIR DISKUS 500-50 MCG/DOSE AEPB Inhale 1 puff into the lungs 2 (two) times daily.  05/04/15  Yes [provider]  albuterol (PROVENTIL HFA;VENTOLIN HFA) 108 (90 Base) MCG/ACT inhaler Inhale 2 puffs into the lungs every 6 (six) hours as needed for wheezing or shortness of breath.   Yes [provider]  aspirin 81 MG tablet Take 81 mg by mouth daily.   Yes [provider]  atorvastatin (LIPITOR) 10 MG tablet Take 10 mg by mouth daily.   Yes [provider]  docusate sodium (COLACE) 100 MG capsule Take 100 mg by mouth 2 (two) times daily.   Yes [provider]  ergocalciferol (VITAMIN D2) 50000 units capsule Take 1 capsule (50,000 Units total) by mouth once a week. Patient taking differently: Take 50,000 Units by mouth every Monday.  05/30/17  Yes [name], MD  fluticasone (FLONASE) 50 MCG/ACT nasal spray Place 2 sprays into both nostrils daily.   Yes [provider]  guaiFENesin (MUCINEX) 600 MG 12 hr tablet Take 1 tablet (600 mg total) by mouth 2 (two) times daily as needed for cough or to loosen phlegm. 06/17/17  Yes Gouru, Illene Silver, MD  lidocaine-prilocaine (EMLA) cream Apply 1 application topically as needed (for port access).  04/26/17  Yes [provider]  loperamide (IMODIUM) 2 MG capsule One pill after each loose stool; maximum upto 8 pills a day. Patient taking differently: Take 2 mg by mouth as needed for diarrhea or loose stools (Up to 8 pills (16 mg) a day).  05/02/17  Yes [name], MD  loratadine (CLARITIN) 10 MG tablet Take 10 mg by mouth daily.   Yes [provider]  Magnesium Cl-Calcium Carbonate (SLOW MAGNESIUM/CALCIUM) 70-117 MG TBEC  Take 1 tablet by mouth 2 (two) times daily. 06/28/17  Yes [name], MD  magnesium hydroxide (MILK OF MAGNESIA) 400 MG/5ML suspension Take 30-45 mLs by mouth daily as needed for mild constipation.   Yes [provider]  metoCLOPramide (REGLAN) 10 MG tablet Take 1 tablet (10 mg total) by mouth 4 (four) times daily. 05/30/17  Yes [name], MD  montelukast (SINGULAIR) 10 MG tablet Take 10 mg by mouth at bedtime.   Yes [provider]  omeprazole (PRILOSEC) 20 MG capsule Take 20 mg by mouth every morning.  06/23/15  Yes [provider]  oxyCODONE-acetaminophen (PERCOCET/ROXICET) 5-325 MG tablet Take 1 tablet by mouth every 8 (eight) hours as needed for severe pain. 09/06/17  Yes [name], MD  potassium chloride SA (K-DUR,KLOR-CON) 20 MEQ tablet 1 pill twice a day 09/06/17  Yes Brahmanday, Elisha Headland, MD  senna-docusate (SENOKOT-S) 8.6-50 MG tablet Take 1 tablet by mouth at bedtime as needed for mild constipation. 06/17/17  Yes Gouru, Illene Silver, MD  SPIRIVA HANDIHALER 18 MCG inhalation capsule Place 18 mcg into inhaler and inhale every morning.  05/04/15  Yes [provider]  acetaminophen (TYLENOL) 325 MG tablet Take 1 tablet (325 mg total) by mouth every 6 (six) hours as needed for mild pain (or Fever >/= 101). Patient not taking: Reported on 09/20/2017 06/17/17   Nicholes Mango, MD  feeding supplement, ENSURE ENLIVE, (ENSURE ENLIVE) LIQD Take 237 mLs by mouth 3 (three) times daily between meals. 03/13/17   Fritzi Mandes, MD  ondansetron (ZOFRAN ODT) 8 MG disintegrating tablet Take 1 tablet (8 mg total) by mouth every 8 (eight) hours as needed for nausea or vomiting. Patient not taking: Reported on 09/20/2017 07/26/17   [name], MD  OXYGEN Inhale 2 L into the lungs continuous.     [provider]  prochlorperazine (COMPAZINE) 10 MG tablet Take 1 tablet (10 mg total) by mouth every 6 (six) hours as needed for nausea or  vomiting. Patient not taking: Reported on 09/20/2017 07/26/17   [name], MD    Allergies Other  Family History  Problem Relation Age of Onset  . Lung cancer Father   . Cancer Maternal Aunt   . Breast cancer Neg Hx     Social History Social History   Tobacco Use  . Smoking status: Former Smoker    Packs/day: 0.25    Years: 15.00    Pack years: 3.75    Types: Cigarettes    Last attempt to quit: 07/23/2017    Years since quitting: 0.1  . Smokeless tobacco: Never Used  . Tobacco comment: currently as of 02-21-16 pt states she is only smoking 2-3 cigarettes per day  Substance Use Topics  . 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 Drug use: No    Review of Systems Constitutional: No fever/chills Eyes: No visual changes. ENT: No sore throat. Cardiovascular: Denies chest pain. Respiratory: Positive for dyspnea and cough Gastrointestinal: No abdominal pain.  No nausea, no vomiting.  No diarrhea.  No constipation. Genitourinary: Negative for dysuria. Musculoskeletal: Negative for neck pain.  Negative for back pain. Integumentary: Negative for rash. Neurological: Negative for headaches, focal weakness or numbness.   ____________________________________________   PHYSICAL EXAM:  VITAL SIGNS: ED Triage Vitals  Enc Vitals Group     BP 09/21/17 0300 113/64     Pulse Rate 09/21/17 0300 (!) 132     Resp 09/21/17 0300 (!) 24     Temp 09/21/17 0300 98 F (36.7 C)     Temp Source 09/21/17 0300 Oral     SpO2 09/21/17 0300 100 %     Weight 09/21/17 0303 55.3 kg (122 lb)     Height 09/21/17 0303 1.626 m (5\' 4" )     Head Circumference --      Peak Flow --      Pain Score 09/21/17 0302 6     Pain Loc --      Pain Edu? --      Excl. in [place]? --     Constitutional: Alert and oriented. Apparent respiratory distress Eyes: Conjunctivae are normal.    Head: Atraumatic. Ears:  Healthy appearing ear canals and TMs bilaterally Nose: No congestion/rhinnorhea. Mouth/Throat: Mucous membranes are moist.  Oropharynx non-erythematous. Neck: No stridor.   Cardiovascular: Tachycardia, regular rhythm. Good peripheral circulation. Grossly normal heart sounds. Respiratory: Tachypnea, positive accessory respiratory muscle use, diffuse rhonchi and wheezing  Gastrointestinal: Soft and nontender.  No distention.  Musculoskeletal: No lower extremity tenderness nor edema. No gross deformities of extremities. Neurologic:  Normal speech and language. No gross focal neurologic deficits are appreciated.  Skin:  Skin is warm, dry and intact. No rash noted. Psychiatric: Mood and affect are normal. Speech and behavior are normal.  ____________________________________________   LABS (all labs ordered are listed, but only abnormal results are displayed)  Labs Reviewed  CBC WITH DIFFERENTIAL/PLATELET - Abnormal; Notable for the following components:      Result Value   RBC 2.89 (*)    Hemoglobin 8.3 (*)    HCT 25.6 (*)    RDW 16.8 (*)    Neutro Abs 8.9 (*)    Lymphs Abs 0.7 (*)    All other components within normal limits  BASIC METABOLIC PANEL - Abnormal; Notable for the following components:   Potassium 3.1 (*)    Glucose, Bld 131 (*)    Calcium 8.1 (*)    All other components within normal limits  CULTURE, BLOOD (ROUTINE X 2)  CULTURE, BLOOD (ROUTINE X 2)  URINE CULTURE  TROPONIN I  BRAIN NATRIURETIC PEPTIDE  BLOOD GAS, VENOUS  LACTIC ACID, PLASMA  LACTIC ACID, PLASMA  URINALYSIS, COMPLETE (UACMP) WITH MICROSCOPIC  INFLUENZA PANEL BY PCR (TYPE A & B)   ____________________________________________  EKG  ED ECG REPORT I, [place] L[place] Orth, the attending physician, personally viewed and interpreted this ECG.   Date: 09/21/2017  EKG Time: 3:16 AM  Rate: 132  Rhythm: Sinus tachycardia  Axis: Normal  Intervals: Normal  ST&T Change:  None  ____________________________________________  RADIOLOGY I, [place] Babe Clenney, personally viewed and evaluated these images (plain radiographs) as part of my medical decision making, as well as reviewing the written report by the radiologist.  Dg Chest Port 1 View  Result Date: 09/21/2017 CLINICAL DATA:  Difficulty breathing EXAM: PORTABLE CHEST 1 VIEW COMPARISON:  08/08/2017, 08/07/2017 FINDINGS: Right-sided central venous port tip overlies the proximal right atrium. No focal pulmonary infiltrate or effusion. Stable chronic interstitial opacities at the bases. Normal heart size. Aortic atherosclerosis. No pneumothorax. IMPRESSION: Stable scarring and chronic interstitial changes at the bases. No acute infiltrate or edema. Electronically Signed   By: Donavan Foil M.D.   On: 09/21/2017 03:43    ____________________________________________   PROCEDURES  Critical Care performed: CRITICAL CARE Performed by: Gregor Hams   Total critical care time: 45 minutes  Critical care time was exclusive of separately billable procedures and treating other patients.  Critical care was necessary to treat or prevent imminent or life-threatening deterioration.  Critical care was time spent personally by me on the following activities: development of treatment plan with patient and/or surrogate as well as nursing, discussions with consultants, evaluation of patient's response to treatment, examination of patient, obtaining history from patient or surrogate, ordering and performing treatments and interventions, ordering and review of laboratory studies, ordering and review of radiographic studies, pulse oximetry and re-evaluation of patient's condition.   Procedures   ____________________________________________   INITIAL IMPRESSION / ASSESSMENT AND PLAN / ED COURSE  As part of my medical decision making, I reviewed the following data within the electronic MEDICAL RECORD [id number] year old female  presenting with above stated history of physical exam consistent with sepsis as patient is tachycardic tachypnea and febrile with a temperature of 100.8. Patient given 2 DuoNeb's and IV Solu-Medrol 125 mg given possibility that this may be a COPD exacerbation. Also consider possibly of pneumonia chest x-ray revealed no evidence of infiltrate. Laboratory  data notable for hemoglobin of 8.3 which is consistent with previous hemoglobin. Urinalysis negative. Following receiving DuoNeb treatments patient states that respiratory status much improved. Patient discussed with Dr. Marcille Blanco for hospital admission further evaluation and management. ____________________________________________  FINAL CLINICAL IMPRESSION(S) / ED DIAGNOSES  Final diagnoses:  Sepsis, due to unspecified organism ([place])  Acute respiratory distress     MEDICATIONS GIVEN DURING THIS VISIT:  Medications  methylPREDNISolone sodium succinate (SOLU-MEDROL) 125 mg/2 mL injection 125 mg (not administered)  dextrose 5 %-0.45 % sodium chloride infusion (not administered)  sodium chloride 0.9 % bolus 1,000 mL (not administered)  sodium chloride 0.9 % bolus 1,000 mL (not administered)  vancomycin (VANCOCIN) IVPB 1000 mg/200 mL premix (not administered)  piperacillin-tazobactam (ZOSYN) IVPB 3.375 g (not administered)  ipratropium-albuterol (DUONEB) 0.5-2.5 (3) MG/3ML nebulizer solution (6 mLs  Given 09/21/17 0306)     ED Discharge Orders    None       Note:  This document was prepared using Dragon voice recognition software and may include unintentional dictation errors.    Gregor Hams, MD 09/21/17 [phone]

## 2017-09-21 NOTE — Progress Notes (Signed)
Pharmacy Antibiotic Note  Sophia Nelson is a 72 y.o. female admitted on 09/21/2017 with sepsis.  Pharmacy has been consulted for vancomycin and Zosyn dosing.  Plan: DW 55kg  Vd 39L kei 0.052 hr-1  T1/2 13 hours Vancomycin 1 gram q 18 hours ordered with stacked dosing. Level before 5th dose. Goal trough 15-20.  Zosyn 3.375 grams q 8 hours ordered.  Height: 5\' 4"  (162.6 cm) Weight: 122 lb (55.3 kg) IBW/kg (Calculated) : 54.7  Temp (24hrs), Avg:98.7 F (37.1 C), Min:97.6 F (36.4 C), Max:100.6 F (38.1 C)  Recent Labs  Lab 09/20/17 0840 09/21/17 0325  WBC 5.1 9.9  CREATININE 0.50 0.53  LATICACIDVEN  --  0.9    Estimated Creatinine Clearance: 57.3 mL/min (by C-G formula based on SCr of 0.53 mg/dL).    Allergies  Allergen Reactions  . Other Other (See Comments)    Onions(that grow in the yard-pt can eat onions without problems) and dust mite Sneezing, cough, runny nose    Antimicrobials this admission: Vancomycin, Zoayn 1/26  >>    >>   Dose adjustments this admission:   Microbiology results: 1/26 BCx: pending 1/26 UCx: pending   06/09/17 MRSA PCR: (-)      1/26 UA: (-) 1/26 CXR: no infiltrate or edema  Thank you for allowing pharmacy to be a part of this patient's care.  Dow Blahnik S 09/21/2017 4:50 AM

## 2017-09-21 NOTE — Progress Notes (Signed)
Spoke to Dr. Marcille Blanco about pt admission orders changed three times in about 30 minutes prior to approving the patient. Per Dr. Marcille Blanco, he has laid eyes on the patient and pt is currently stable thus no need for higher level of care. Primary nurse was made aware of MD comments.

## 2017-09-21 NOTE — ED Notes (Signed)
portacath accessed with 20g huber needle without difficulty per patients request

## 2017-09-22 ENCOUNTER — Encounter: Payer: Self-pay | Admitting: Radiology

## 2017-09-22 ENCOUNTER — Inpatient Hospital Stay: Payer: Medicare HMO

## 2017-09-22 DIAGNOSIS — D649 Anemia, unspecified: Secondary | ICD-10-CM

## 2017-09-22 DIAGNOSIS — C50919 Malignant neoplasm of unspecified site of unspecified female breast: Secondary | ICD-10-CM

## 2017-09-22 DIAGNOSIS — R0603 Acute respiratory distress: Secondary | ICD-10-CM

## 2017-09-22 LAB — CBC
HCT: 21 % — ABNORMAL LOW (ref 35.0–47.0)
HEMOGLOBIN: 6.9 g/dL — AB (ref 12.0–16.0)
MCH: 29.1 pg (ref 26.0–34.0)
MCHC: 33 g/dL (ref 32.0–36.0)
MCV: 88 fL (ref 80.0–100.0)
PLATELETS: 252 10*3/uL (ref 150–440)
RBC: 2.39 MIL/uL — AB (ref 3.80–5.20)
RDW: 17.3 % — ABNORMAL HIGH (ref 11.5–14.5)
WBC: 8.3 10*3/uL (ref 3.6–11.0)

## 2017-09-22 LAB — URINE CULTURE: Culture: NO GROWTH

## 2017-09-22 LAB — IRON AND TIBC
Iron: 51 ug/dL (ref 28–170)
SATURATION RATIOS: 45 % — AB (ref 10.4–31.8)
TIBC: 112 ug/dL — ABNORMAL LOW (ref 250–450)
UIBC: 61 ug/dL

## 2017-09-22 LAB — BASIC METABOLIC PANEL
Anion gap: 4 — ABNORMAL LOW (ref 5–15)
BUN: 10 mg/dL (ref 6–20)
CHLORIDE: 110 mmol/L (ref 101–111)
CO2: 27 mmol/L (ref 22–32)
Calcium: 7.8 mg/dL — ABNORMAL LOW (ref 8.9–10.3)
Creatinine, Ser: 0.6 mg/dL (ref 0.44–1.00)
Glucose, Bld: 103 mg/dL — ABNORMAL HIGH (ref 65–99)
POTASSIUM: 3.1 mmol/L — AB (ref 3.5–5.1)
Sodium: 141 mmol/L (ref 135–145)

## 2017-09-22 LAB — FERRITIN: FERRITIN: 660 ng/mL — AB (ref 11–307)

## 2017-09-22 LAB — VITAMIN B12: Vitamin B-12: 209 pg/mL (ref 180–914)

## 2017-09-22 LAB — HEMOGLOBIN: Hemoglobin: 9.1 g/dL — ABNORMAL LOW (ref 12.0–16.0)

## 2017-09-22 LAB — RETICULOCYTES
RBC.: 2.4 MIL/uL — AB (ref 3.80–5.20)
RETIC COUNT ABSOLUTE: 43.2 10*3/uL (ref 19.0–183.0)
Retic Ct Pct: 1.8 % (ref 0.4–3.1)

## 2017-09-22 LAB — MAGNESIUM: Magnesium: 1.5 mg/dL — ABNORMAL LOW (ref 1.7–2.4)

## 2017-09-22 LAB — FOLATE: Folate: 7.2 ng/mL (ref >5.9)

## 2017-09-22 LAB — PREPARE RBC (CROSSMATCH)

## 2017-09-22 MED ORDER — IOPAMIDOL (ISOVUE-300) INJECTION 61%
100.0000 mL | Freq: Once | INTRAVENOUS | Status: AC | PRN
  Administered 2017-09-22: 12:00:00 100 mL via INTRAVENOUS

## 2017-09-22 MED ORDER — SODIUM CHLORIDE 0.9 % IV SOLN
Freq: Once | INTRAVENOUS | Status: AC
  Administered 2017-09-22: 08:00:00 via INTRAVENOUS

## 2017-09-22 NOTE — Progress Notes (Signed)
Patient's hemoglobin up to 9.1 after blood transfusion, notified Dr. Serita Grit, received verbal order to hold the second unit of blood.

## 2017-09-22 NOTE — Consult Note (Signed)
Hematology/Oncology Consult note [facility] [phone] [phone]  Patient Care Team: Donnie Coffin, MD as PCP - General (Family Medicine) Donnie Coffin, MD as Referring Physician (Family Medicine) Christene Lye, MD (General Surgery) Forest Gleason, MD (Oncology) [name], MD as Consulting Physician (Internal Medicine)   Name of the patient: Sophia Nelson  [id number]  [birth year]   Date of visit: 09/22/17 REASON FOR COSULTATION:  Metastatic cancer with recurrent anemia History of presenting illness-  72 year old female with a history of metastatic breast cancer/lobular ER/PR positive, HER-2/neu negative currently on weekly Taxol, presented to emergency room complaining about generalized weakness, shortness of breath, nonproductive cough, feeling cold.  Denies any fever or chills.  Patient is on home oxygen 4 L at home.  In the emergency room patient was tachycardic with a heart rate of 115. Lab workup reviewed hemoglobin 6.9.  Patient is admitted for symptomatic anemia, SIRS, acute on chronic respiratory failure.  Blood culture pending.  She was started on vancomycin and Zosyn.  09/21/2017  CXR showed no infiltrate or edema.  UA negative. Oncology is consulted given patient's history of metastatic breast cancer  on chemotherapy now with symptomatic anemia. Patient was seen and evaluated at bedside.  Patient reports feeling better since admission.  She reports breathing is slightly better, appetite is fair.  Denies any chest pain, abdominal pain.    Review of systems- Review of Systems  Constitutional: Positive for malaise/fatigue. Negative for chills and fever.  HENT: Negative for hearing loss.   Eyes: Negative for pain.  Respiratory: Positive for cough and shortness of breath.   Cardiovascular: Negative for chest pain.  Gastrointestinal: Negative for heartburn and nausea.  Genitourinary: Negative for dysuria and  urgency.  Musculoskeletal: Negative for myalgias.  Skin: Negative for rash.  Neurological: Negative for dizziness.  Endo/Heme/Allergies: Does not bruise/bleed easily.  Psychiatric/Behavioral: The patient is not nervous/anxious.     Allergies  Allergen Reactions  . Other Other (See Comments)    Onions(that grow in the yard-pt can eat onions without problems) and dust mite Sneezing, cough, runny nose    Patient Active Problem List   Diagnosis Date Noted  . Acute on chronic respiratory failure with hypoxia ([place]) 09/21/2017  . Cholecystitis 08/07/2017  . Syncope   . Metastatic breast cancer ([place])   . Hypotension   . Palliative care by specialist   . Goals of care, counseling/discussion   . Severe sepsis with septic shock ([place]) 06/09/2017  . Hypocalcemia 05/30/2017  . Metastasis to bone ([place]) 04/30/2017  . Acute back pain 03/28/2017  . Back pain, chronic 03/15/2017  . Chronic lumbar pain 03/15/2017  . Recurrent low back pain 03/15/2017  . Duodenal obstruction   . Pancreatitis, acute 03/04/2017  . Abdominal distention   . Encounter for nasogastric (NG) tube placement   . Gastric outlet obstruction 02/24/2017  . AKI (acute kidney injury) ([place]) 02/24/2017  . GERD (gastroesophageal reflux disease) 02/24/2017  . Diabetes ([place]) 02/24/2017  . COPD (chronic obstructive pulmonary disease) ([place]) 02/24/2017  . Neck pain 04/19/2016  . Carcinoma of overlapping sites of left breast in female, estrogen receptor positive ([place]) 03/22/2016     Past Medical History:  Diagnosis Date  . Abdominal distention   . AKI (acute kidney injury) ([place]) 02/24/2017  . Anemia   . Arthritis   . Asthma   . Back pain, chronic 03/15/2017  . Breast cancer ([place]) 2009   RT MASTECTOMY, DCIS  . Cancer of intra-abdominal (  [place])    Metastatic breast cancer lobular carcinoma wth recurrence in the abdomen status post resection  . Cancer of left breast ([place]) 08/09/2015   T4 N1 M1 tumor, INVASIVE LOBULAR CARCINOMA.    . Carcinoma of overlapping sites of left breast in female, estrogen receptor positive ([place]) 03/22/2016  . COPD (chronic obstructive pulmonary disease) ([place])   . Diabetes ([place]) 02/24/2017  . Diabetes mellitus without complication ([place])   . Duodenal obstruction   . Encounter for nasogastric (NG) tube placement   . Gastric outlet obstruction 02/24/2017  . GERD (gastroesophageal reflux disease)   . Headache    h/o migraines as a child  . Hypertension   . Neck pain 04/19/2016  . Pancreatitis, acute 03/04/2017  . Pneumonia 2015  . Recurrent low back pain 03/15/2017  . Shortness of breath dyspnea    with exertion     Past Surgical History:  Procedure Laterality Date  . ABDOMINAL HYSTERECTOMY    . BREAST SURGERY Right 2015   mastectomy  . ESOPHAGOGASTRODUODENOSCOPY N/A 02/25/2017   Procedure: ESOPHAGOGASTRODUODENOSCOPY (EGD);  Surgeon: Jonathon Bellows, MD;  [facility] ENDOSCOPY;  Service: Endoscopy;  Laterality: N/A;  . ESOPHAGOGASTRODUODENOSCOPY (EGD) WITH PROPOFOL N/A 03/06/2017   Procedure: ESOPHAGOGASTRODUODENOSCOPY (EGD) WITH PROPOFOL;  Surgeon: [name], MD;  Location: [facility] ENDOSCOPY;  Service: Endoscopy;  Laterality: N/A;  . GASTROJEJUNOSTOMY N/A 03/08/2017   Procedure: Roux-en-Y gastrojejunostomy Bypass of Gastric Outlet Obstruction, small bowel resection;  Surgeon: Vickie Epley, MD;  Location: [facility] ORS;  Service: General;  Laterality: N/A;  . PORTACATH PLACEMENT Right 08/01/2017   Procedure: INSERTION PORT-A-CATH;  Surgeon: Christene Lye, MD;  Location: [facility] ORS;  Service: General;  Laterality: Right;  . SIMPLE MASTECTOMY WITH AXILLARY SENTINEL NODE BIOPSY Left 02/29/2016   Procedure: SIMPLE MASTECTOMY;  Surgeon: Christene Lye, MD;  Location: [facility] ORS;  Service: General;  Laterality: Left;  . TUBAL LIGATION      Social History   Socioeconomic History  . Marital status: Divorced    Spouse name: Not on file  . Number of children: Not on file  . Years of  education: Not on file  . Highest education level: Not on file  Social Needs  . Financial resource strain: Not on file  . Food insecurity - worry: Not on file  . Food insecurity - inability: Not on file  . Transportation needs - medical: Not on file  . Transportation needs - non-medical: Not on file  Occupational History  . Not on file  Tobacco Use  . Smoking status: Former Smoker    Packs/day: 0.25    Years: 15.00    Pack years: 3.75    Types: Cigarettes    Last attempt to quit: 07/23/2017    Years since quitting: 0.1  . Smokeless tobacco: Never Used  . Tobacco comment: currently as of 02-21-16 pt states she is only smoking 2-3 cigarettes per day  Substance and Sexual Activity  . 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 Drug use: No  . Sexual activity: Not on file  Other Topics Concern  . Not on file  Social History Narrative  . Not on file     Family History  Problem Relation Age of Onset  . Lung cancer Father   . Cancer Maternal Aunt   . Breast cancer Neg Hx  Current Facility-Administered Medications:  .  acetaminophen (TYLENOL) tablet 650 mg, 650 mg, Oral, Q6H PRN, 650 mg at 09/22/17 0957 **OR** acetaminophen (TYLENOL) suppository 650 mg, 650 mg, Rectal, Q6H PRN, Harrie Foreman, MD .  aspirin chewable tablet 81 mg, 81 mg, Oral, Daily, Harrie Foreman, MD, 81 mg at 09/21/17 0843 .  atorvastatin (LIPITOR) tablet 10 mg, 10 mg, Oral, Daily, Harrie Foreman, MD, 10 mg at 09/21/17 0843 .  calcium carbonate (TUMS - dosed in mg elemental calcium) chewable tablet 200 mg of elemental calcium, 1 tablet, Oral, Daily, Harrie Foreman, MD, 200 mg of elemental calcium at 09/21/17 0843 .  docusate sodium (COLACE) capsule 100 mg, 100 mg, Oral, BID, Harrie Foreman, MD, 100 mg at 09/21/17 2144 .  [START ON 09/23/2017] ergocalciferol (VITAMIN D2)  capsule 50,000 Units, 50,000 Units, Oral, Q Mon, Harrie Foreman, MD .  feeding supplement (ENSURE ENLIVE) (ENSURE ENLIVE) liquid 237 mL, 237 mL, Oral, TID BM, Harrie Foreman, MD, 237 mL at 09/21/17 1820 .  fluticasone (FLONASE) 50 MCG/ACT nasal spray 2 spray, 2 spray, Each Nare, Daily, Harrie Foreman, MD, 2 spray at 09/21/17 [phone] .  guaiFENesin (MUCINEX) 12 hr tablet 600 mg, 600 mg, Oral, BID PRN, Harrie Foreman, MD .  lidocaine-prilocaine (EMLA) cream 1 application, 1 application, Topical, PRN, Harrie Foreman, MD .  loratadine (CLARITIN) tablet 10 mg, 10 mg, Oral, Daily, Harrie Foreman, MD, 10 mg at 09/21/17 0843 .  magnesium hydroxide (MILK OF MAGNESIA) suspension 30-45 mL, 30-45 mL, Oral, Daily PRN, Harrie Foreman, MD .  magnesium oxide (MAG-OX) tablet 400 mg, 400 mg, Oral, Daily, Harrie Foreman, MD, 400 mg at 09/21/17 0843 .  meropenem (MERREM) 1 g in sodium chloride 0.9 % 100 mL IVPB, 1 g, Intravenous, Q8H, Dustin Flock, MD, Stopped at 09/22/17 0550 .  metoCLOPramide (REGLAN) tablet 10 mg, 10 mg, Oral, QID, Harrie Foreman, MD, 10 mg at 09/21/17 1821 .  mometasone-formoterol (DULERA) 200-5 MCG/ACT inhaler 2 puff, 2 puff, Inhalation, BID, Harrie Foreman, MD, 2 puff at 09/21/17 2144 .  montelukast (SINGULAIR) tablet 10 mg, 10 mg, Oral, QHS, Harrie Foreman, MD, 10 mg at 09/21/17 2144 .  ondansetron (ZOFRAN) tablet 4 mg, 4 mg, Oral, Q6H PRN **OR** ondansetron (ZOFRAN) injection 4 mg, 4 mg, Intravenous, Q6H PRN, Harrie Foreman, MD .  oxyCODONE-acetaminophen (PERCOCET/ROXICET) 5-325 MG per tablet 1 tablet, 1 tablet, Oral, Q8H PRN, Harrie Foreman, MD, 1 tablet at 09/21/17 [phone] .  pantoprazole (PROTONIX) EC tablet 40 mg, 40 mg, Oral, Daily, Harrie Foreman, MD, 40 mg at 09/21/17 0843 .  potassium chloride SA (K-DUR,KLOR-CON) CR tablet 20 mEq, 20 mEq, Oral, BID, Harrie Foreman, MD, 20 mEq at 09/21/17 2144 .  senna-docusate (Senokot-S) tablet 1  tablet, 1 tablet, Oral, QHS PRN, Harrie Foreman, MD .  tiotropium [facility]) inhalation capsule 18 mcg, 18 mcg, Inhalation, Edwinna Areola, MD, 18 mcg at 09/21/17 0841   Physical exam:  Vitals:   09/21/17 1953 09/22/17 0427 09/22/17 0755 09/22/17 0827  BP: (!) 122/43 (!) 138/37 (!) 138/43 135/69  Pulse: 97 84 94 99  Resp: (!) _0 Temp: 98.6 F (37 C) 98.5 F (36.9 C) 98.6 F (37 C) 98.3 F (36.8 C)  TempSrc: Oral Oral Oral Oral  SpO2: 97% 100% 100% 100%  Weight:  123 lb 7.3 oz (56 kg)    Height:  GENERAL:Alert, no distress and comfortable.  EYES: + pallor, non icterus OROPHARYNX: no thrush or ulceration; NECK: supple, no masses felt LYMPH:  no palpable lymphadenopathy in the cervical, axillary or inguinal regions LUNGS: Right lower lobe crackles. HEART/CVS: regular rate & rhythm and no murmurs;  ABDOMEN: abdomen soft, non-tender and normal bowel sounds Musculoskeletal:no cyanosis of digits and no clubbing  PSYCH: alert & oriented x 3  NEURO: no focal motor/sensory deficits SKIN:  no rashes or significant lesions     CMP Latest Ref Rng & Units 09/22/2017  Glucose 65 - 99 mg/dL 103(H)  BUN 6 - 20 mg/dL 10  Creatinine 0.44 - 1.00 mg/dL 0.60  Sodium 135 - 145 mmol/L 141  Potassium 3.5 - 5.1 mmol/L 3.1(L)  Chloride 101 - 111 mmol/L 110  CO2 22 - 32 mmol/L 27  Calcium 8.9 - 10.3 mg/dL 7.8(L)  Total Protein 6.5 - 8.1 g/dL -  Total Bilirubin 0.3 - 1.2 mg/dL -  Alkaline Phos 38 - 126 U/L -  AST 15 - 41 U/L -  ALT 14 - 54 U/L -   CBC Latest Ref Rng & Units 09/22/2017  WBC 3.6 - 11.0 K/uL 8.3  Hemoglobin 12.0 - 16.0 g/dL 6.9(L)  Hematocrit 35.0 - 47.0 % 21.0(L)  Platelets 150 - 440 K/uL 252    Dg Chest Port 1 View  Result Date: 09/21/2017 CLINICAL DATA:  Difficulty breathing EXAM: PORTABLE CHEST 1 VIEW COMPARISON:  08/08/2017, 08/07/2017 FINDINGS: Right-sided central venous port tip overlies the proximal right atrium. No focal pulmonary  infiltrate or effusion. Stable chronic interstitial opacities at the bases. Normal heart size. Aortic atherosclerosis. No pneumothorax. IMPRESSION: Stable scarring and chronic interstitial changes at the bases. No acute infiltrate or edema. Electronically Signed   By: Donavan Foil M.D.   On: 09/21/2017 03:43    Assessment and plan- Patient is a 72 y.o. female with history of metastatic breast cancer presented with acute on chronic respiratory failure  and symptomatic anemia.    #Symptomatic anemia: Agree with PRBC transfusion to maintain hemoglobin above 7.  Iron panel reviewed, consistent with anemia of chronic disease. Anemia etiology is multifactorial, retic count inappropriately normal, likely hyperproliferation anemia due to chemotherapy bone marrow suppression as well as possible pulmonary involvement by lobular breast cancer. Metastatic lobular breast cancer: Outpatient follow-up with Dr. Rogue Bussing.   Thank you for this kind referral and the opportunity to participate in the care of this patient  Earlie Server, MD, PhD Hematology [place] [facility] Pager- [phone] 09/22/2017

## 2017-09-22 NOTE — Progress Notes (Signed)
Dr. Marcille Blanco notified of BP 135/37. No new orders received.

## 2017-09-22 NOTE — Plan of Care (Signed)
  Education: Knowledge of General Education information will improve 09/22/2017 2054 - Progressing by Herbie Baltimore, RN   Health Behavior/Discharge Planning: Ability to manage health-related needs will improve 09/22/2017 2054 - Progressing by Herbie Baltimore, RN   Clinical Measurements: Ability to maintain clinical measurements within normal limits will improve 09/22/2017 2054 - Progressing by Herbie Baltimore, RN Will remain free from infection 09/22/2017 2054 - Progressing by Herbie Baltimore, RN Diagnostic test results will improve 09/22/2017 2054 - Progressing by Herbie Baltimore, RN Respiratory complications will improve 09/22/2017 2054 - Progressing by Herbie Baltimore, RN Cardiovascular complication will be avoided 09/22/2017 2054 - Progressing by Herbie Baltimore, RN   Activity: Risk for activity intolerance will decrease 09/22/2017 2054 - Progressing by Herbie Baltimore, RN   Nutrition: Adequate nutrition will be maintained 09/22/2017 2054 - Progressing by Herbie Baltimore, RN   Coping: Level of anxiety will decrease 09/22/2017 2054 - Progressing by Herbie Baltimore, RN   Elimination: Will not experience complications related to bowel motility 09/22/2017 2054 - Progressing by Herbie Baltimore, RN Will not experience complications related to urinary retention 09/22/2017 2054 - Progressing by Herbie Baltimore, RN   Pain Managment: General experience of comfort will improve 09/22/2017 2054 - Progressing by Herbie Baltimore, RN   Safety: Ability to remain free from injury will improve 09/22/2017 2054 - Progressing by Herbie Baltimore, RN   Skin Integrity: Risk for impaired skin integrity will decrease 09/22/2017 2054 - Progressing by Herbie Baltimore, RN

## 2017-09-23 DIAGNOSIS — E43 Unspecified severe protein-calorie malnutrition: Secondary | ICD-10-CM

## 2017-09-23 LAB — OCCULT BLOOD X 1 CARD TO LAB, STOOL: Fecal Occult Bld: POSITIVE — AB

## 2017-09-23 MED ORDER — MAGNESIUM SULFATE 2 GM/50ML IV SOLN: 2.0000 g | Freq: Once | INTRAVENOUS | Status: DC

## 2017-09-23 MED ORDER — HEPARIN SOD (PORK) LOCK FLUSH 100 UNIT/ML IV SOLN
500.0000 [IU] | Freq: Once | INTRAVENOUS | Status: DC
  Filled 2017-09-23: qty 5

## 2017-09-23 MED ORDER — LEVOFLOXACIN 500 MG PO TABS: 500.0000 mg | ORAL_TABLET | Freq: Every day | ORAL | 0 refills | Status: AC

## 2017-09-23 NOTE — Progress Notes (Signed)
Sophia Nelson   DOB:07/31/1946   OI#:[id number]    Subjective: Patient feels energy levels improved since PRBC transfusion.  No more fevers.  No nausea no vomiting.  Eager to go home.  ROS chronic mild abdominal pain fairly well controlled.  No diarrhea.  No constipation.:   Objective:  Vitals:   09/23/17 0433 09/23/17 0840  BP: 114/60 (!) 116/59  Pulse: 90 86  Resp: 20 20  Temp: 98.3 F (36.8 C) 98.2 F (36.8 C)  SpO2: 100% 100%     Intake/Output Summary (Last 24 hours) at 09/23/2017 1431 Last data filed at 09/23/2017 0900 Gross per 24 hour  Intake 360 ml  Output -  Net 360 ml    GENERAL Alert, no distress and comfortable. O2 nasal cannula.  Cachectic appearing.  Patient alone. EYES: no pallor or icterus OROPHARYNX: no thrush or ulceration. NECK: supple, no masses felt LYMPH:  no palpable lymphadenopathy in the cervical, axillary or inguinal regions LUNGS: decreased breath sounds to auscultation at bases and  No wheeze or crackles HEART/CVS: regular rate & rhythm and no murmurs; No lower extremity edema ABDOMEN: abdomen soft, tender  on deep palpation. and normal bowel sounds Musculoskeletal:no cyanosis of digits and no clubbing  PSYCH: alert & oriented x 3 with fluent speech NEURO: no focal motor/sensory deficits SKIN:  no rashes or significant lesions   Labs:  Lab Results  Component Value Date   WBC 8.3 09/22/2017   HGB 9.1 (L) 09/22/2017   HCT 21.0 (L) 09/22/2017   MCV 88.0 09/22/2017   PLT 252 09/22/2017   NEUTROABS 8.9 (H) 09/21/2017    Lab Results  Component Value Date   NA 141 09/22/2017   K 3.1 (L) 09/22/2017   CL 110 09/22/2017   CO2 27 09/22/2017    Studies:  Ct Abdomen Pelvis W Contrast  Result Date: 09/22/2017 CLINICAL DATA:  Abdominal distension and pain with bowel movements. History of breast cancer. EXAM: CT ABDOMEN AND PELVIS WITH CONTRAST TECHNIQUE: Multidetector CT imaging of the abdomen and pelvis was performed using the standard protocol  following bolus administration of intravenous contrast. CONTRAST:  174mL ISOVUE-300 IOPAMIDOL (ISOVUE-300) INJECTION 61% COMPARISON:  07/29/2017.  Head CT 03/07/2017 FINDINGS: Lower chest: Bilateral effusions right larger than left with dependent atelectasis. Hepatobiliary: No liver parenchymal abnormality is seen. No calcified gallstones. Pancreas: Normal Spleen: Normal Adrenals/Urinary Tract: Adrenal glands are normal. Mild fullness of both renal collecting systems and ureters with ureters being prominent all the way to the bladder. No bladder pathology is discernible. Stomach/Bowel: Chronically dilated stomach. Duodenum is also dilated and fluid filled. There is a very abnormal short segment of bowel at the duodenal jejunal junction which shows shouldering and contrast enhancement. The concern is that this could represent a small bowel tumor, either primary or metastatic. There is a second focus of abnormal small intestine probably in the ileum located in the central right portion of the abdomen that also looks like tumor. This elevates the likelihood of metastatic disease, though unusual. The differential diagnosis would be inflammatory bowel disease of some sort. There probably a third area of involvement in the right colon near the ileocecal valve region. No sign of diverticulosis or diverticulitis. No other focal bowel finding. Vascular/Lymphatic: Aortic atherosclerosis. No aneurysm. IVC is normal. No retroperitoneal adenopathy. Reproductive: Previous hysterectomy.  No pelvic mass. Other: Moderate amount of free fluid in the pelvis, etiology uncertain. Musculoskeletal: Ordinary lumbar degenerative changes. Treated metastatic lesion of the L3 vertebral body no evidence of progressive  change. IMPRESSION: Short-segment abnormal intestine at the duodenal jejunal junction with thickening and enhancement worrisome for primary tumor or metastatic tumor. There is a second focus of abnormal small intestine probably in  the ileum in the mid to right portion of the abdomen that is also quite likely secondary to tumor, elevating the possibility of metastatic disease. Lastly, one could question thickening and enhancement in the right colon near the ileocecal valve that could be a third focus of involvement. The patient has a history of breast cancer and presumably this would be secondary to that process. However, melanoma can metastasis eyes to the intestine. Chronically dilated stomach and duodenum. New demonstration of mild fullness of the renal collecting systems and ureters, etiology unknown. No evidence of stone disease or obstructing bladder lesion. Small to moderate amount of free fluid in the pelvic cul de sac. Aortic atherosclerosis. Small bilateral pleural effusions with dependent atelectasis right more than left. Treated metastasis of the L3 vertebra. Electronically Signed   By: Nelson Chimes M.D.   On: 09/22/2017 13:44    Assessment & Plan:   #72 year old female patient with history of lobular breast cancer metastatic to the abdomen currently on Taxol is currently up to the hospital for generalized weakness/fevers  # Metastatic lobular cancer the breast-metastatic to abdomen [not clearly visualized on imaging].  Currently on Taxol.  No obvious evidence of clinical progression.  Continue Taxol for now.   #Severe anemia-multifactorial/including chemotherapy.  Status post 2 units of PRBC transfusion hemoglobin today 9.  Recommend Aranesp outpatient.  #Severe malnutrition/secondary to underlying cancer.  Continue p.o. Ensure.  #Above plan discussed with the patient in detail.  She is eager to go home.  Also discussed with Dr. Posey Pronto.  [name], MD 09/23/2017  2:31 PM

## 2017-09-23 NOTE — Plan of Care (Signed)
  Progressing Education: Knowledge of General Education information will improve 09/23/2017 0959 - Progressing by Rowe Robert, RN Health Behavior/Discharge Planning: Ability to manage health-related needs will improve 09/23/2017 0959 - Progressing by Rowe Robert, RN Clinical Measurements: Ability to maintain clinical measurements within normal limits will improve 09/23/2017 0959 - Progressing by Rowe Robert, RN Will remain free from infection 09/23/2017 0959 - Progressing by Rowe Robert, RN Diagnostic test results will improve 09/23/2017 0959 - Progressing by Rowe Robert, RN Respiratory complications will improve 09/23/2017 0959 - Progressing by Rowe Robert, RN Cardiovascular complication will be avoided 09/23/2017 0959 - Progressing by Rowe Robert, RN Activity: Risk for activity intolerance will decrease 09/23/2017 0959 - Progressing by Rowe Robert, RN Nutrition: Adequate nutrition will be maintained 09/23/2017 0959 - Progressing by Rowe Robert, RN Coping: Level of anxiety will decrease 09/23/2017 0959 - Progressing by Rowe Robert, RN Elimination: Will not experience complications related to bowel motility 09/23/2017 0959 - Progressing by Rowe Robert, RN Will not experience complications related to urinary retention 09/23/2017 0959 - Progressing by Rowe Robert, RN Pain Managment: General experience of comfort will improve 09/23/2017 0959 - Progressing by Rowe Robert, RN Safety: Ability to remain free from injury will improve 09/23/2017 0959 - Progressing by Rowe Robert, RN Skin Integrity: Risk for impaired skin integrity will decrease 09/23/2017 0959 - Progressing by Rowe Robert, RN

## 2017-09-23 NOTE — Progress Notes (Signed)
Pt for discharge home. Alert. No resp distress. Instructions discussed with pt. presc called  Elect.  Other home meds discussed. Diet / activity and f/u discussed.  Port deaccessed per policy and  Flushed  With good blood return.  Home via w/c with dtr in law.

## 2017-09-23 NOTE — Care Management Important Message (Signed)
Important Message  Patient Details  Name: Sophia Nelson MRN: [id number] Date of Birth: September 04, 1945   Medicare Important Message Given:  Yes    Shelbie Ammons, RN 09/23/2017, 7:22 AM

## 2017-09-23 NOTE — Progress Notes (Signed)
[place] at [facility]                                                                                                                                                                                  Patient Demographics   Sophia Nelson, is a 72 y.o. female, DOB - 1945-08-29, XVQ:[id number]  Admit date - 09/21/2017   Admitting Physician Harrie Foreman, MD  Outpatient Primary MD for the patient is Aycock, Edmonia Lynch, MD   LOS - 2  Subjective: Patient continues to complain of some abdominal pain also noticed to have drop in her hemoglobin with IV hydration   Review of Systems:   CONSTITUTIONAL: No documented fever. No fatigue, weakness. No weight gain, no weight loss.  EYES: No blurry or double vision.  ENT: No tinnitus. No postnasal drip. No redness of the oropharynx.  RESPIRATORY: No cough, no wheeze, no hemoptysis.  Positive dyspnea.  CARDIOVASCULAR: No chest pain. No orthopnea. No palpitations. No syncope.  GASTROINTESTINAL: No nausea, no vomiting or diarrhea. No abdominal pain. No melena or hematochezia.  GENITOURINARY: No dysuria or hematuria.  ENDOCRINE: No polyuria or nocturia. No heat or cold intolerance.  HEMATOLOGY: No anemia. No bruising. No bleeding.  INTEGUMENTARY: No rashes. No lesions.  MUSCULOSKELETAL: No arthritis. No swelling. No gout.  NEUROLOGIC: No numbness, tingling, or ataxia. No seizure-type activity.  PSYCHIATRIC: No anxiety. No insomnia. No ADD.    Vitals:   Vitals:   09/22/17 1826 09/22/17 2020 09/23/17 0433 09/23/17 0840  BP: (!) 151/55 (!) 143/48 114/60 (!) 116/59  Pulse: 85 86 90 86  Resp: 16 20 20 20   Temp: 99.5 F (37.5 C) 98 F (36.7 C) 98.3 F (36.8 C) 98.2 F (36.8 C)  TempSrc: Oral Oral Oral Oral  SpO2: 100% 100% 100% 100%  Weight:   121 lb 4.1 oz (55 kg)   Height:        Wt Readings from Last 3 Encounters:  09/23/17 121 lb 4.1 oz (55 kg)  09/13/17 122 lb 12.7 oz (55.7 kg)  09/06/17 115 lb (52.2 kg)      Intake/Output Summary (Last 24 hours) at 09/23/2017 1804 Last data filed at 09/23/2017 0900 Gross per 24 hour  Intake 240 ml  Output -  Net 240 ml    Physical Exam:   GENERAL: Pleasant-appearing in no apparent distress.  HEAD, EYES, EARS, NOSE AND THROAT: Atraumatic, normocephalic. Extraocular muscles are intact. Pupils equal and reactive to light. Sclerae anicteric. No conjunctival injection. No oro-pharyngeal erythema.  NECK: Supple. There is no jugular venous distention. No bruits, no lymphadenopathy, no thyromegaly.  HEART: Regular rate and rhythm,. No murmurs, no rubs, no  clicks.  LUNGS: Right lung rhonchus breath sounds.  ABDOMEN: Soft, flat, nontender, nondistended. Has good bowel sounds. No hepatosplenomegaly appreciated.  EXTREMITIES: No evidence of any cyanosis, clubbing, or peripheral edema.  +2 pedal and radial pulses bilaterally.  NEUROLOGIC: The patient is alert, awake, and oriented x3 with no focal motor or sensory deficits appreciated bilaterally.  SKIN: Moist and warm with no rashes appreciated.  Psych: Not anxious, depressed LN: No inguinal LN enlargement    Antibiotics   Anti-infectives (From admission, onward)   Start     Dose/Rate Route Frequency Ordered Stop   09/23/17 0000  levofloxacin (LEVAQUIN) 500 MG tablet     500 mg Oral Daily 09/23/17 0908 09/28/17 2359   09/21/17 2200  meropenem (MERREM) 1 g in sodium chloride 0.9 % 100 mL IVPB  Status:  Discontinued     1 g 200 mL/hr over 30 Minutes Intravenous Every 8 hours 09/21/17 2017 09/23/17 1712   09/21/17 1600  vancomycin (VANCOCIN) IVPB 1000 mg/200 mL premix  Status:  Discontinued     1,000 mg 200 mL/hr over 60 Minutes Intravenous Every 18 hours 09/21/17 0448 09/21/17 1257   09/21/17 1300  levofloxacin (LEVAQUIN) IVPB 500 mg  Status:  Discontinued     500 mg 100 mL/hr over 60 Minutes Intravenous Every 24 hours 09/21/17 1257 09/21/17 2017   09/21/17 1000  piperacillin-tazobactam (ZOSYN) IVPB 3.375 g   Status:  Discontinued     3.375 g 12.5 mL/hr over 240 Minutes Intravenous Every 8 hours 09/21/17 0448 09/21/17 1257   09/21/17 0415  vancomycin (VANCOCIN) IVPB 1000 mg/200 mL premix     1,000 mg 200 mL/hr over 60 Minutes Intravenous  Once 09/21/17 0402 09/21/17 0547   09/21/17 0415  piperacillin-tazobactam (ZOSYN) IVPB 3.375 g     3.375 g 100 mL/hr over 30 Minutes Intravenous  Once 09/21/17 0402 09/21/17 0455      Medications   Scheduled Meds:  Continuous Infusions:  PRN Meds:.   Data Review:   Micro Results Recent Results (from the past 240 hour(s))  Blood Culture (routine x 2)     Status: Abnormal (Preliminary result)   Collection Time: 09/21/17  3:44 AM  Result Value Ref Range Status   Specimen Description   Final    BLOOD PORTA CATH Performed at [facility], [address] [place], [place] [address]    Special Requests   Final    BOTTLES DRAWN AEROBIC AND ANAEROBIC Blood Culture results may not be optimal due to an excessive volume of blood received in culture bottles Performed at [facility], [address] [place], [place] [address]    Culture  Setup Time   Final    GRAM NEGATIVE RODS ANAEROBIC BOTTLE ONLY CRITICAL RESULT CALLED TO, READ BACK BY AND VERIFIED WITH: NATE COOKSON ON 09/21/17 AT 2000 QSD    Culture (A)  Final    ESCHERICHIA COLI SUSCEPTIBILITIES TO FOLLOW Performed at [facility], [address] [place], [place] [address]    Report Status PENDING  Incomplete  Urine culture     Status: None   Collection Time: 09/21/17  3:44 AM  Result Value Ref Range Status   Specimen Description   Final    URINE, CLEAN CATCH Performed at [facility], [address] [place], [place] [address]    Special Requests   Final    NONE Performed at [facility], [address] [place], [place] [address]    Culture  Final    NO GROWTH Performed at [facility], [address] [address] [place], [place] [address]     Report Status 09/22/2017 FINAL  Final  Blood Culture ID Panel (Reflexed)     Status: Abnormal   Collection Time: 09/21/17  3:44 AM  Result Value Ref Range Status   Enterococcus species NOT DETECTED NOT DETECTED Final   Vancomycin resistance NOT DETECTED NOT DETECTED Final   Listeria monocytogenes NOT DETECTED NOT DETECTED Final   Staphylococcus species NOT DETECTED NOT DETECTED Final   Staphylococcus aureus NOT DETECTED NOT DETECTED Final   Methicillin resistance NOT DETECTED NOT DETECTED Final   Streptococcus species NOT DETECTED NOT DETECTED Final   Streptococcus agalactiae NOT DETECTED NOT DETECTED Final   Streptococcus pneumoniae NOT DETECTED NOT DETECTED Final   Streptococcus pyogenes NOT DETECTED NOT DETECTED Final   Acinetobacter baumannii NOT DETECTED NOT DETECTED Final   Enterobacteriaceae species DETECTED (A) NOT DETECTED Final    Comment: CRITICAL RESULT CALLED TO, READ BACK BY AND VERIFIED WITH: NATE COOKSON ON 09/21/17 AT 2000 QSD Enterobacteriaceae represent a large family of gram-negative bacteria, not a single organism.    Enterobacter cloacae complex NOT DETECTED NOT DETECTED Final   Escherichia coli DETECTED (A) NOT DETECTED Final    Comment: CRITICAL RESULT CALLED TO, READ BACK BY AND VERIFIED WITH: NATE COOKSON ON 09/21/17 AT 2000 QSD    Klebsiella oxytoca NOT DETECTED NOT DETECTED Final   Klebsiella pneumoniae NOT DETECTED NOT DETECTED Final   Proteus species NOT DETECTED NOT DETECTED Final   Serratia marcescens NOT DETECTED NOT DETECTED Final   Carbapenem resistance NOT DETECTED NOT DETECTED Final   Haemophilus influenzae NOT DETECTED NOT DETECTED Final   Neisseria meningitidis NOT DETECTED NOT DETECTED Final   Pseudomonas aeruginosa NOT DETECTED NOT DETECTED Final   Candida albicans NOT DETECTED NOT DETECTED Final   Candida glabrata NOT DETECTED NOT DETECTED Final   Candida krusei NOT DETECTED NOT DETECTED Final   Candida parapsilosis NOT DETECTED NOT  DETECTED Final   Candida tropicalis NOT DETECTED NOT DETECTED Final    Comment: Performed at [facility], [address] [place], [place] [address]  Blood Culture (routine x 2)     Status: None (Preliminary result)   Collection Time: 09/21/17  4:33 AM  Result Value Ref Range Status   Specimen Description BLOOD LEFT ANTECUBITAL  Final   Special Requests   Final    BOTTLES DRAWN AEROBIC AND ANAEROBIC Blood Culture adequate volume   Culture   Final    NO GROWTH 2 DAYS Performed at [facility], [address] [place], [place] [address]    Report Status PENDING  Incomplete    Radiology Reports Ct Abdomen Pelvis W Contrast  Result Date: 09/22/2017 CLINICAL DATA:  Abdominal distension and pain with bowel movements. History of breast cancer. EXAM: CT ABDOMEN AND PELVIS WITH CONTRAST TECHNIQUE: Multidetector CT imaging of the abdomen and pelvis was performed using the standard protocol following bolus administration of intravenous contrast. CONTRAST:  159mL ISOVUE-300 IOPAMIDOL (ISOVUE-300) INJECTION 61% COMPARISON:  07/29/2017.  Head CT 03/07/2017 FINDINGS: Lower chest: Bilateral effusions right larger than left with dependent atelectasis. Hepatobiliary: No liver parenchymal abnormality is seen. No calcified gallstones. Pancreas: Normal Spleen: Normal Adrenals/Urinary Tract: Adrenal glands are normal. Mild fullness of both renal collecting systems and ureters with ureters being prominent all the way to the bladder. No bladder pathology is discernible. Stomach/Bowel: Chronically dilated stomach. Duodenum is also dilated and fluid filled. There  is a very abnormal short segment of bowel at the duodenal jejunal junction which shows shouldering and contrast enhancement. The concern is that this could represent a small bowel tumor, either primary or metastatic. There is a second focus of abnormal small intestine probably in the ileum located in the central right portion of the abdomen  that also looks like tumor. This elevates the likelihood of metastatic disease, though unusual. The differential diagnosis would be inflammatory bowel disease of some sort. There probably a third area of involvement in the right colon near the ileocecal valve region. No sign of diverticulosis or diverticulitis. No other focal bowel finding. Vascular/Lymphatic: Aortic atherosclerosis. No aneurysm. IVC is normal. No retroperitoneal adenopathy. Reproductive: Previous hysterectomy.  No pelvic mass. Other: Moderate amount of free fluid in the pelvis, etiology uncertain. Musculoskeletal: Ordinary lumbar degenerative changes. Treated metastatic lesion of the L3 vertebral body no evidence of progressive change. IMPRESSION: Short-segment abnormal intestine at the duodenal jejunal junction with thickening and enhancement worrisome for primary tumor or metastatic tumor. There is a second focus of abnormal small intestine probably in the ileum in the mid to right portion of the abdomen that is also quite likely secondary to tumor, elevating the possibility of metastatic disease. Lastly, one could question thickening and enhancement in the right colon near the ileocecal valve that could be a third focus of involvement. The patient has a history of breast cancer and presumably this would be secondary to that process. However, melanoma can metastasis eyes to the intestine. Chronically dilated stomach and duodenum. New demonstration of mild fullness of the renal collecting systems and ureters, etiology unknown. No evidence of stone disease or obstructing bladder lesion. Small to moderate amount of free fluid in the pelvic cul de sac. Aortic atherosclerosis. Small bilateral pleural effusions with dependent atelectasis right more than left. Treated metastasis of the L3 vertebra. Electronically Signed   By: Nelson Chimes M.D.   On: 09/22/2017 13:44   Dg Chest Port 1 View  Result Date: 09/21/2017 CLINICAL DATA:  Difficulty breathing  EXAM: PORTABLE CHEST 1 VIEW COMPARISON:  08/08/2017, 08/07/2017 FINDINGS: Right-sided central venous port tip overlies the proximal right atrium. No focal pulmonary infiltrate or effusion. Stable chronic interstitial opacities at the bases. Normal heart size. Aortic atherosclerosis. No pneumothorax. IMPRESSION: Stable scarring and chronic interstitial changes at the bases. No acute infiltrate or edema. Electronically Signed   By: Donavan Foil M.D.   On: 09/21/2017 03:43     CBC Recent Labs  Lab 09/20/17 0840 09/21/17 0325 09/22/17 0450 09/22/17 1538  WBC 5.1 9.9 8.3  --   HGB 8.2* 8.3* 6.9* 9.1*  HCT 25.0* 25.6* 21.0*  --   PLT 348 322 252  --   MCV 89.4 88.5 88.0  --   MCH 29.4 28.9 29.1  --   MCHC 32.9 32.6 33.0  --   RDW 16.5* 16.8* 17.3*  --   LYMPHSABS 1.1 0.7*  --   --   MONOABS 0.3 0.2  --   --   EOSABS 0.1 0.0  --   --   BASOSABS 0.1 0.0  --   --     Chemistries  Recent Labs  Lab 09/20/17 0840 09/21/17 0325 09/22/17 0450  NA 139 136 141  K 3.7 3.1* 3.1*  CL 106 103 110  CO2 27 24 27   GLUCOSE 101* 131* 103*  BUN 11 10 10   CREATININE 0.50 0.53 0.60  CALCIUM 8.3* 8.1* 7.8*  MG  --   --  1.5*   ------------------------------------------------------------------------------------------------------------------ estimated creatinine clearance is 52.5 mL/min (by C-G formula based on SCr of 0.6 mg/dL). ------------------------------------------------------------------------------------------------------------------ No results for input(s): HGBA1C in the last 72 hours. ------------------------------------------------------------------------------------------------------------------ No results for input(s): CHOL, HDL, LDLCALC, TRIG, CHOLHDL, LDLDIRECT in the last 72 hours. ------------------------------------------------------------------------------------------------------------------ Recent Labs    09/21/17 0325  TSH 1.644    ------------------------------------------------------------------------------------------------------------------ Recent Labs    09/22/17 0815  VITAMINB12 209  FOLATE 7.2  FERRITIN 660*  TIBC 112*  IRON 51  RETICCTPCT 1.8    Coagulation profile No results for input(s): INR, PROTIME in the last 168 hours.  No results for input(s): DDIMER in the last 72 hours.  Cardiac Enzymes Recent Labs  Lab 09/21/17 0325  TROPONINI <0.03   ------------------------------------------------------------------------------------------------------------------ Invalid input(s): POCBNP    Assessment & Plan   This is a 72 year old female admitted for respiratory failure. 1.  Sepsis: Source unclear chest x-ray urinalysis negative continue Levaquin 2.  Respiratory failure: Acute on chronic; with hypoxia.  Possible developing pneumonia.  Differential diagnosis includes COPD exacerbation. 3.    Symptomatic anemia hemoglobin is dropped further we will give her 1 unit of packed RBCs ask hematologist to see  4.    Abdominal pain CT scan of the abdomen will be ordered 5.  Metastatic breast cancer: With lytic lesions of the spine.  The patient is currently undergoing chemotherapy. 6.  DVT prophylaxis: Lovenox 7.  GI prophylaxis: PPI per home regimen         Code Status Orders  (From admission, onward)        Start     Ordered   09/21/17 0633  Full code  Continuous     09/21/17 0632    Code Status History    Date Active Date Inactive Code Status Order ID Comments User Context   08/08/2017 01:34 08/10/2017 00:29 Full Code [id number]  Lance Coon, MD Inpatient   06/09/2017 03:31 06/17/2017 17:15 Full Code [id number]  Harvie Bridge, DO ED   03/04/2017 01:23 03/13/2017 20:56 Full Code [id number]  Harvie Bridge, DO Inpatient   02/24/2017 23:40 02/26/2017 17:06 Full Code [id number]  Lance Coon, MD Inpatient           Consults none  DVT Prophylaxis  Lovenox   Lab Results   Component Value Date   PLT 252 09/22/2017     Time Spent in minutes 32 minutes greater than 50% of time spent in care coordination and counseling patient regarding the condition and plan of care.   Dustin Flock M.D on 09/23/2017 at 6:04 PM  Between 7am to 6pm - Pager - [phone]  After 6pm go to [email or web address] - password [email or web address] [facility]   Office  [phone]

## 2017-09-23 NOTE — Discharge Summary (Signed)
[place] at [facility], 72 y.o., DOB 1945/11/14, MRN [id number]. Admission date: 09/21/2017 Discharge Date 09/23/2017 Primary MD Donnie Coffin, MD Admitting Physician Harrie Foreman, MD  Admission Diagnosis  Acute respiratory distress [R06.03] Sepsis, due to unspecified organism [facility]) [A41.9] Acute on chronic respiratory failure with hypoxia [facility]) [J96.21]  Discharge Diagnosis   Active Problems: Sepsis etiology unclear  Acute on chronic respiratory failure with hypoxia (HCC) Acute on chronic COPD exasperation   Symptomatic anemia Hypokalemia    Metastatic breast cancer       Hospital Course patient 72 year old who is on chronic oxygen therapy presented with shortness of breath was thought to have possible sepsis however urinalysis chest x-ray all were nonrevealing.  Patient was treated with antibiotics.  Her fevers have now resolved.  She was treated for possible asthma/COPD exasperation.  Her breathing is back to baseline.  Patient was given IV fluids and a hemoglobin was noted to drop.  She has a history of metastatic breast cancer receiving chemo her anemia was thought to be multifactorial she received 1 unit of packed RBCs hemoglobin is now better.  Patient was also complaining of abdominal pain therefore had a CT scan of the abdomen which showed metastatic disease.  Her oncologist saw her in the hospital.  We will follow her up as outpatient.             Consults  hematology/oncology  Significant Tests:  See full reports for all details    Ct Abdomen Pelvis W Contrast  Result Date: 09/22/2017 CLINICAL DATA:  Abdominal distension and pain with bowel movements. History of breast cancer. EXAM: CT ABDOMEN AND PELVIS WITH CONTRAST TECHNIQUE: Multidetector CT imaging of the abdomen and pelvis was performed using the standard protocol following bolus administration of intravenous contrast. CONTRAST:  167mL ISOVUE-300 IOPAMIDOL  (ISOVUE-300) INJECTION 61% COMPARISON:  07/29/2017.  Head CT 03/07/2017 FINDINGS: Lower chest: Bilateral effusions right larger than left with dependent atelectasis. Hepatobiliary: No liver parenchymal abnormality is seen. No calcified gallstones. Pancreas: Normal Spleen: Normal Adrenals/Urinary Tract: Adrenal glands are normal. Mild fullness of both renal collecting systems and ureters with ureters being prominent all the way to the bladder. No bladder pathology is discernible. Stomach/Bowel: Chronically dilated stomach. Duodenum is also dilated and fluid filled. There is a very abnormal short segment of bowel at the duodenal jejunal junction which shows shouldering and contrast enhancement. The concern is that this could represent a small bowel tumor, either primary or metastatic. There is a second focus of abnormal small intestine probably in the ileum located in the central right portion of the abdomen that also looks like tumor. This elevates the likelihood of metastatic disease, though unusual. The differential diagnosis would be inflammatory bowel disease of some sort. There probably a third area of involvement in the right colon near the ileocecal valve region. No sign of diverticulosis or diverticulitis. No other focal bowel finding. Vascular/Lymphatic: Aortic atherosclerosis. No aneurysm. IVC is normal. No retroperitoneal adenopathy. Reproductive: Previous hysterectomy.  No pelvic mass. Other: Moderate amount of free fluid in the pelvis, etiology uncertain. Musculoskeletal: Ordinary lumbar degenerative changes. Treated metastatic lesion of the L3 vertebral body no evidence of progressive change. IMPRESSION: Short-segment abnormal intestine at the duodenal jejunal junction with thickening and enhancement worrisome for primary tumor or metastatic tumor. There is a second focus of abnormal small intestine probably in the ileum in the mid to right portion of the abdomen that is also quite likely secondary  to  tumor, elevating the possibility of metastatic disease. Lastly, one could question thickening and enhancement in the right colon near the ileocecal valve that could be a third focus of involvement. The patient has a history of breast cancer and presumably this would be secondary to that process. However, melanoma can metastasis eyes to the intestine. Chronically dilated stomach and duodenum. New demonstration of mild fullness of the renal collecting systems and ureters, etiology unknown. No evidence of stone disease or obstructing bladder lesion. Small to moderate amount of free fluid in the pelvic cul de sac. Aortic atherosclerosis. Small bilateral pleural effusions with dependent atelectasis right more than left. Treated metastasis of the L3 vertebra. Electronically Signed   By: Nelson Chimes M.D.   On: 09/22/2017 13:44   Dg Chest Port 1 View  Result Date: 09/21/2017 CLINICAL DATA:  Difficulty breathing EXAM: PORTABLE CHEST 1 VIEW COMPARISON:  08/08/2017, 08/07/2017 FINDINGS: Right-sided central venous port tip overlies the proximal right atrium. No focal pulmonary infiltrate or effusion. Stable chronic interstitial opacities at the bases. Normal heart size. Aortic atherosclerosis. No pneumothorax. IMPRESSION: Stable scarring and chronic interstitial changes at the bases. No acute infiltrate or edema. Electronically Signed   By: Donavan Foil M.D.   On: 09/21/2017 03:43       Today   Subjective:   Sophia Nelson patient feeling much better  Objective:   Blood pressure (!) 116/59, pulse 86, temperature 98.2 F (36.8 C), temperature source Oral, resp. rate 20, height 5\' 2"  (1.575 m), weight 121 lb 4.1 oz (55 kg), SpO2 100 %.  .  Intake/Output Summary (Last 24 hours) at 09/23/2017 1800 Last data filed at 09/23/2017 0900 Gross per 24 hour  Intake 240 ml  Output -  Net 240 ml    Exam VITAL SIGNS: Blood pressure (!) 116/59, pulse 86, temperature 98.2 F (36.8 C), temperature source Oral, resp.  rate 20, height 5\' 2"  (1.575 m), weight 121 lb 4.1 oz (55 kg), SpO2 100 %.  GENERAL:  72 y.o.-year-old patient lying in the bed with no acute distress.  EYES: Pupils equal, round, reactive to light and accommodation. No scleral icterus. Extraocular muscles intact.  HEENT: Head atraumatic, normocephalic. Oropharynx and nasopharynx clear.  NECK:  Supple, no jugular venous distention. No thyroid enlargement, no tenderness.  LUNGS: Normal breath sounds bilaterally, no wheezing, rales,rhonchi or crepitation. No use of accessory muscles of respiration.  CARDIOVASCULAR: S1, S2 normal. No murmurs, rubs, or gallops.  ABDOMEN: Soft, nontender, nondistended. Bowel sounds present. No organomegaly or mass.  EXTREMITIES: No pedal edema, cyanosis, or clubbing.  NEUROLOGIC: Cranial nerves II through XII are intact. Muscle strength 5/5 in all extremities. Sensation intact. Gait not checked.  PSYCHIATRIC: The patient is alert and oriented x 3.  SKIN: No obvious rash, lesion, or ulcer.   Data Review     CBC w Diff:  Lab Results  Component Value Date   WBC 8.3 09/22/2017   HGB 9.1 (L) 09/22/2017   HGB 15.8 04/08/2014   HCT 21.0 (L) 09/22/2017   HCT 49.9 (H) 04/08/2014   PLT 252 09/22/2017   PLT 285 04/08/2014   LYMPHOPCT 8 09/21/2017   MONOPCT 2 09/21/2017   EOSPCT 0 09/21/2017   BASOPCT 0 09/21/2017   CMP:  Lab Results  Component Value Date   NA 141 09/22/2017   NA 133 (L) 04/08/2014   K 3.1 (L) 09/22/2017   K 4.2 04/08/2014   CL 110 09/22/2017   CL 99 04/08/2014   CO2  27 09/22/2017   CO2 28 04/08/2014   BUN 10 09/22/2017   BUN 5 (L) 04/08/2014   CREATININE 0.60 09/22/2017   CREATININE 0.42 (L) 04/08/2014   PROT 4.8 (L) 09/13/2017   ALBUMIN 2.1 (L) 09/13/2017   BILITOT 0.3 09/13/2017   ALKPHOS 65 09/13/2017   AST 29 09/13/2017   ALT 9 (L) 09/13/2017  .  Micro Results Recent Results (from the past 240 hour(s))  Blood Culture (routine x 2)     Status: Abnormal (Preliminary  result)   Collection Time: 09/21/17  3:44 AM  Result Value Ref Range Status   Specimen Description   Final    BLOOD PORTA CATH Performed at [facility], [address], [place], [place] [address]    Special Requests   Final    BOTTLES DRAWN AEROBIC AND ANAEROBIC Blood Culture results may not be optimal due to an excessive volume of blood received in culture bottles Performed at [facility], [address], [place], [place] [address]    Culture  Setup Time   Final    GRAM NEGATIVE RODS ANAEROBIC BOTTLE ONLY CRITICAL RESULT CALLED TO, READ BACK BY AND VERIFIED WITH: NATE COOKSON ON 09/21/17 AT 2000 QSD    Culture (A)  Final    ESCHERICHIA COLI SUSCEPTIBILITIES TO FOLLOW Performed at [facility], [place] [address] [place], [place] [address]    Report Status PENDING  Incomplete  Urine culture     Status: None   Collection Time: 09/21/17  3:44 AM  Result Value Ref Range Status   Specimen Description   Final    URINE, CLEAN CATCH Performed at [facility], [address], [place], [place] [address]    Special Requests   Final    NONE Performed at [facility], [address], [place], [place] [address]    Culture   Final    NO GROWTH Performed at [facility], [place] [address], [place],  [address]    Report Status 09/22/2017 FINAL  Final  Blood Culture ID Panel (Reflexed)     Status: Abnormal   Collection Time: 09/21/17  3:44 AM  Result Value Ref Range Status   Enterococcus species NOT DETECTED NOT DETECTED Final   Vancomycin resistance NOT DETECTED NOT DETECTED Final   Listeria monocytogenes NOT DETECTED NOT DETECTED Final   Staphylococcus species NOT DETECTED NOT DETECTED Final   Staphylococcus aureus NOT DETECTED NOT DETECTED Final   Methicillin resistance NOT DETECTED NOT DETECTED Final   Streptococcus species NOT DETECTED NOT DETECTED Final   Streptococcus agalactiae NOT DETECTED NOT DETECTED Final    Streptococcus pneumoniae NOT DETECTED NOT DETECTED Final   Streptococcus pyogenes NOT DETECTED NOT DETECTED Final   Acinetobacter baumannii NOT DETECTED NOT DETECTED Final   Enterobacteriaceae species DETECTED (A) NOT DETECTED Final    Comment: CRITICAL RESULT CALLED TO, READ BACK BY AND VERIFIED WITH: NATE COOKSON ON 09/21/17 AT 2000 QSD Enterobacteriaceae represent a large family of gram-negative bacteria, not a single organism.    Enterobacter cloacae complex NOT DETECTED NOT DETECTED Final   Escherichia coli DETECTED (A) NOT DETECTED Final    Comment: CRITICAL RESULT CALLED TO, READ BACK BY AND VERIFIED WITH: NATE COOKSON ON 09/21/17 AT 2000 QSD    Klebsiella oxytoca NOT DETECTED NOT DETECTED Final   Klebsiella pneumoniae NOT DETECTED NOT DETECTED Final   Proteus species NOT DETECTED NOT DETECTED Final   Serratia marcescens NOT DETECTED NOT DETECTED Final  Carbapenem resistance NOT DETECTED NOT DETECTED Final   Haemophilus influenzae NOT DETECTED NOT DETECTED Final   Neisseria meningitidis NOT DETECTED NOT DETECTED Final   Pseudomonas aeruginosa NOT DETECTED NOT DETECTED Final   Candida albicans NOT DETECTED NOT DETECTED Final   Candida glabrata NOT DETECTED NOT DETECTED Final   Candida krusei NOT DETECTED NOT DETECTED Final   Candida parapsilosis NOT DETECTED NOT DETECTED Final   Candida tropicalis NOT DETECTED NOT DETECTED Final    Comment: Performed at [facility], [address] [place], [place] [address]  Blood Culture (routine x 2)     Status: None (Preliminary result)   Collection Time: 09/21/17  4:33 AM  Result Value Ref Range Status   Specimen Description BLOOD LEFT ANTECUBITAL  Final   Special Requests   Final    BOTTLES DRAWN AEROBIC AND ANAEROBIC Blood Culture adequate volume   Culture   Final    NO GROWTH 2 DAYS Performed at [facility], [address], [place], [place] [address]    Report Status PENDING  Incomplete     Code Status  History    Date Active Date Inactive Code Status Order ID Comments User Context   09/21/2017 06:33 09/23/2017 17:17 Full Code [id number]  Harrie Foreman, MD Inpatient   08/08/2017 01:34 08/10/2017 00:29 Full Code [id number]  Lance Coon, MD Inpatient   06/09/2017 03:31 06/17/2017 17:15 Full Code [id number]  Harvie Bridge, DO ED   03/04/2017 01:23 03/13/2017 20:56 Full Code [id number]  Harvie Bridge, DO Inpatient   02/24/2017 23:40 02/26/2017 17:06 Full Code [id number]  Lance Coon, MD Inpatient          Follow-up Information    [name], MD On 10/04/2017.   Specialties:  Internal Medicine, Oncology Why:  at 9:45 a.m. - Labs first Contact information: [place] [address] [phone]           Discharge Medications   Allergies as of 09/23/2017      Reactions   Other Other (See Comments)   Onions(that grow in the yard-pt can eat onions without problems) and dust mite Sneezing, cough, runny nose      Medication List    TAKE these medications   acetaminophen 325 MG tablet Commonly known as:  TYLENOL Take 1 tablet (325 mg total) by mouth every 6 (six) hours as needed for mild pain (or Fever >/= 101).   ADVAIR DISKUS 500-50 MCG/DOSE Aepb Generic drug:  Fluticasone-Salmeterol Inhale 1 puff into the lungs 2 (two) times daily.   albuterol 108 (90 Base) MCG/ACT inhaler Commonly known as:  PROVENTIL HFA;VENTOLIN HFA Inhale 2 puffs into the lungs every 6 (six) hours as needed for wheezing or shortness of breath.   aspirin 81 MG tablet Take 81 mg by mouth daily.   atorvastatin 10 MG tablet Commonly known as:  LIPITOR Take 10 mg by mouth daily.   docusate sodium 100 MG capsule Commonly known as:  COLACE Take 100 mg by mouth 2 (two) times daily.   ergocalciferol 50000 units capsule Commonly known as:  VITAMIN D2 Take 1 capsule (50,000 Units total) by mouth once a week. What changed:  when to take this   feeding supplement  (ENSURE ENLIVE) Liqd Take 237 mLs by mouth 3 (three) times daily between meals.   fluticasone 50 MCG/ACT nasal spray Commonly known as:  FLONASE Place 2 sprays into both nostrils daily.   gabapentin 300 MG capsule Commonly known as:  NEURONTIN Take  300 mg by mouth 2 (two) times daily.   guaiFENesin 600 MG 12 hr tablet Commonly known as:  MUCINEX Take 1 tablet (600 mg total) by mouth 2 (two) times daily as needed for cough or to loosen phlegm.   levofloxacin 500 MG tablet Commonly known as:  LEVAQUIN Take 1 tablet (500 mg total) by mouth daily for 5 days.   lidocaine-prilocaine cream Commonly known as:  EMLA Apply 1 application topically as needed (for port access).   lisinopril 40 MG tablet Commonly known as:  PRINIVIL,ZESTRIL Take 40 mg by mouth daily.   loperamide 2 MG capsule Commonly known as:  IMODIUM One pill after each loose stool; maximum upto 8 pills a day. What changed:    how much to take  how to take this  when to take this  reasons to take this  additional instructions   loratadine 10 MG tablet Commonly known as:  CLARITIN Take 10 mg by mouth daily.   Magnesium Cl-Calcium Carbonate 70-117 MG Tbec Commonly known as:  SLOW MAGNESIUM/CALCIUM Take 1 tablet by mouth 2 (two) times daily.   magnesium hydroxide 400 MG/5ML suspension Commonly known as:  MILK OF MAGNESIA Take 30-45 mLs by mouth daily as needed for mild constipation.   metoCLOPramide 10 MG tablet Commonly known as:  REGLAN Take 1 tablet (10 mg total) by mouth 4 (four) times daily.   montelukast 10 MG tablet Commonly known as:  SINGULAIR Take 10 mg by mouth at bedtime.   omeprazole 20 MG capsule Commonly known as:  PRILOSEC Take 20 mg by mouth every morning.   ondansetron 8 MG disintegrating tablet Commonly known as:  ZOFRAN ODT Take 1 tablet (8 mg total) by mouth every 8 (eight) hours as needed for nausea or vomiting.   oxyCODONE-acetaminophen 5-325 MG tablet Commonly known as:   PERCOCET/ROXICET Take 1 tablet by mouth every 8 (eight) hours as needed for severe pain.   OXYGEN Inhale 2 L into the lungs continuous.   potassium chloride SA 20 MEQ tablet Commonly known as:  K-DUR,KLOR-CON 1 pill twice a day   prochlorperazine 10 MG tablet Commonly known as:  COMPAZINE Take 1 tablet (10 mg total) by mouth every 6 (six) hours as needed for nausea or vomiting.   senna-docusate 8.6-50 MG tablet Commonly known as:  Senokot-S Take 1 tablet by mouth at bedtime as needed for mild constipation.   SPIRIVA HANDIHALER 18 MCG inhalation capsule Generic drug:  tiotropium Place 18 mcg into inhaler and inhale every morning.          Total Time in preparing paper work, data evaluation and todays exam - 73 minutes  Dustin Flock M.D on 09/23/2017 at 6:00 PM  [place]  [phone]

## 2017-09-23 NOTE — Discharge Instructions (Signed)
[place] at [place]:  Regular diet  DISCHARGE CONDITION:  Stable  ACTIVITY:  Activity as tolerated  OXYGEN:  Home Oxygen: yes   Oxygen Delivery: 2l oxygen  DISCHARGE LOCATION:  home    ADDITIONAL DISCHARGE INSTRUCTION:   If you experience worsening of your admission symptoms, develop shortness of breath, life threatening emergency, suicidal or homicidal thoughts you must seek medical attention immediately by calling 911 or calling your MD immediately  if symptoms less severe.  You Must read complete instructions/literature along with all the possible adverse reactions/side effects for all the Medicines you take and that have been prescribed to you. Take any new Medicines after you have completely understood and accpet all the possible adverse reactions/side effects.   Please note  You were cared for by a hospitalist during your hospital stay. If you have any questions about your discharge medications or the care you received while you were in the hospital after you are discharged, you can call the unit and asked to speak with the hospitalist on call if the hospitalist that took care of you is not available. Once you are discharged, your primary care physician will handle any further medical issues. Please note that NO REFILLS for any discharge medications will be authorized once you are discharged, as it is imperative that you return to your primary care physician (or establish a relationship with a primary care physician if you do not have one) for your aftercare needs so that they can reassess your need for medications and monitor your lab values.

## 2017-09-24 ENCOUNTER — Telehealth: Payer: Self-pay | Admitting: Internal Medicine

## 2017-09-24 LAB — TYPE AND SCREEN
ABO/RH(D): O POS
Antibody Screen: NEGATIVE
Unit division: 0
Unit division: 0

## 2017-09-24 LAB — BPAM RBC
Blood Product Expiration Date: 201902192359
Blood Product Expiration Date: 201902202359
ISSUE DATE / TIME: 201901270755
Unit Type and Rh: 5100
Unit Type and Rh: 5100

## 2017-09-24 LAB — CULTURE, BLOOD (ROUTINE X 2)

## 2017-09-24 NOTE — Telephone Encounter (Signed)
aranesp has been added per md order.

## 2017-09-24 NOTE — Telephone Encounter (Signed)
Schedule patient for Aranesp when she comes for treatment on 2/08.

## 2017-09-26 LAB — CULTURE, BLOOD (ROUTINE X 2)
Culture: NO GROWTH
Special Requests: ADEQUATE

## 2017-10-04 ENCOUNTER — Encounter: Payer: Self-pay | Admitting: Internal Medicine

## 2017-10-04 ENCOUNTER — Inpatient Hospital Stay (HOSPITAL_BASED_OUTPATIENT_CLINIC_OR_DEPARTMENT_OTHER): Payer: Medicare HMO | Admitting: Internal Medicine

## 2017-10-04 ENCOUNTER — Inpatient Hospital Stay: Payer: Medicare HMO | Attending: Internal Medicine

## 2017-10-04 ENCOUNTER — Inpatient Hospital Stay: Payer: Medicare HMO

## 2017-10-04 VITALS — BP 132/69 | HR 105 | Temp 98.2°F | Resp 16 | Wt 113.4 lb

## 2017-10-04 DIAGNOSIS — C50811 Malignant neoplasm of overlapping sites of right female breast: Secondary | ICD-10-CM | POA: Diagnosis present

## 2017-10-04 DIAGNOSIS — C50812 Malignant neoplasm of overlapping sites of left female breast: Secondary | ICD-10-CM

## 2017-10-04 DIAGNOSIS — C7951 Secondary malignant neoplasm of bone: Secondary | ICD-10-CM

## 2017-10-04 DIAGNOSIS — Z170 Estrogen receptor positive status [ER+]: Secondary | ICD-10-CM

## 2017-10-04 DIAGNOSIS — Z5111 Encounter for antineoplastic chemotherapy: Secondary | ICD-10-CM | POA: Diagnosis not present

## 2017-10-04 DIAGNOSIS — D649 Anemia, unspecified: Secondary | ICD-10-CM | POA: Diagnosis not present

## 2017-10-04 DIAGNOSIS — M899 Disorder of bone, unspecified: Secondary | ICD-10-CM

## 2017-10-04 DIAGNOSIS — R109 Unspecified abdominal pain: Secondary | ICD-10-CM

## 2017-10-04 DIAGNOSIS — Z7189 Other specified counseling: Secondary | ICD-10-CM

## 2017-10-04 LAB — COMPREHENSIVE METABOLIC PANEL
ALT: 8 U/L — ABNORMAL LOW (ref 14–54)
AST: 20 U/L (ref 15–41)
Albumin: 2.9 g/dL — ABNORMAL LOW (ref 3.5–5.0)
Alkaline Phosphatase: 78 U/L (ref 38–126)
Anion gap: 7 (ref 5–15)
BUN: 10 mg/dL (ref 6–20)
CO2: 27 mmol/L (ref 22–32)
CREATININE: 0.45 mg/dL (ref 0.44–1.00)
Calcium: 8.9 mg/dL (ref 8.9–10.3)
Chloride: 105 mmol/L (ref 101–111)
GFR calc Af Amer: >60 mL/min (ref >60)
GLUCOSE: 96 mg/dL (ref 65–99)
Potassium: 3.7 mmol/L (ref 3.5–5.1)
Sodium: 139 mmol/L (ref 135–145)
Total Bilirubin: 0.6 mg/dL (ref 0.3–1.2)
Total Protein: 6 g/dL — ABNORMAL LOW (ref 6.5–8.1)

## 2017-10-04 LAB — CBC WITH DIFFERENTIAL/PLATELET
Basophils Absolute: 0.2 10*3/uL — ABNORMAL HIGH (ref 0–0.1)
Basophils Relative: 3 %
EOS PCT: 3 %
Eosinophils Absolute: 0.2 10*3/uL (ref 0–0.7)
HCT: 28.6 % — ABNORMAL LOW (ref 35.0–47.0)
Hemoglobin: 9.4 g/dL — ABNORMAL LOW (ref 12.0–16.0)
LYMPHS ABS: 1.6 10*3/uL (ref 1.0–3.6)
Lymphocytes Relative: 24 %
MCH: 28.5 pg (ref 26.0–34.0)
MCHC: 33 g/dL (ref 32.0–36.0)
MCV: 86.3 fL (ref 80.0–100.0)
MONO ABS: 0.8 10*3/uL (ref 0.2–0.9)
Monocytes Relative: 13 %
Neutro Abs: 3.9 10*3/uL (ref 1.4–6.5)
Neutrophils Relative %: 57 %
PLATELETS: 485 10*3/uL — AB (ref 150–440)
RBC: 3.31 MIL/uL — AB (ref 3.80–5.20)
RDW: 16.6 % — AB (ref 11.5–14.5)
WBC: 6.7 10*3/uL (ref 3.6–11.0)

## 2017-10-04 MED ORDER — SODIUM CHLORIDE 0.9 % IV SOLN
Freq: Once | INTRAVENOUS | Status: AC
  Administered 2017-10-04: 12:00:00 via INTRAVENOUS
  Filled 2017-10-04: qty 1000

## 2017-10-04 MED ORDER — DARBEPOETIN ALFA 500 MCG/ML IJ SOSY
500.0000 ug | PREFILLED_SYRINGE | Freq: Once | INTRAMUSCULAR | Status: AC
  Administered 2017-10-04: 500 ug via SUBCUTANEOUS
  Filled 2017-10-04: qty 1

## 2017-10-04 MED ORDER — MORPHINE SULFATE ER 30 MG PO TBCR: 30.0000 mg | EXTENDED_RELEASE_TABLET | Freq: Two times a day (BID) | ORAL | 0 refills | Status: DC

## 2017-10-04 MED ORDER — SODIUM CHLORIDE 0.9 % IV SOLN: 10.0000 mg | Freq: Once | INTRAVENOUS | Status: DC

## 2017-10-04 MED ORDER — DEXAMETHASONE SODIUM PHOSPHATE 10 MG/ML IJ SOLN
10.0000 mg | Freq: Once | INTRAMUSCULAR | Status: AC
  Administered 2017-10-04: 10 mg via INTRAVENOUS
  Filled 2017-10-04: qty 1

## 2017-10-04 MED ORDER — HEPARIN SOD (PORK) LOCK FLUSH 100 UNIT/ML IV SOLN
500.0000 [IU] | Freq: Once | INTRAVENOUS | Status: AC
  Administered 2017-10-04: 500 [IU] via INTRAVENOUS
  Filled 2017-10-04: qty 5

## 2017-10-04 MED ORDER — OXYCODONE-ACETAMINOPHEN 5-325 MG PO TABS: 1.0000 | ORAL_TABLET | Freq: Three times a day (TID) | ORAL | 0 refills | Status: DC | PRN

## 2017-10-04 MED ORDER — SODIUM CHLORIDE 0.9 % IV SOLN
80.0000 mg/m2 | Freq: Once | INTRAVENOUS | Status: AC
  Administered 2017-10-04: 120 mg via INTRAVENOUS
  Filled 2017-10-04: qty 20

## 2017-10-04 MED ORDER — DIPHENHYDRAMINE HCL 50 MG/ML IJ SOLN
50.0000 mg | Freq: Once | INTRAMUSCULAR | Status: AC
  Administered 2017-10-04: 50 mg via INTRAVENOUS
  Filled 2017-10-04: qty 1

## 2017-10-04 MED ORDER — FAMOTIDINE IN NACL 20-0.9 MG/50ML-% IV SOLN
20.0000 mg | Freq: Once | INTRAVENOUS | Status: AC
  Administered 2017-10-04: 20 mg via INTRAVENOUS
  Filled 2017-10-04: qty 50

## 2017-10-04 NOTE — Assessment & Plan Note (Addendum)
#  Metastatic breast cancer/lobular ER/PR positive HER-2/neu negative-July-AUG 2018- progression in the bone L3; also abdomen causing extrinsic compression of the small bowel [status post resection-C discussion below]. On Taxol weekly.  # Proceed with cycle # 3 day-1 today; Labs today reviewed; Acceptable for treatment today- except for- [see below]-anemia hemoglobin 9.2; okay with aranesp today.   # Difficult to assess response given lack of clear visualization of the tumor on the CT scans.  Most recent CT scan January 2018 showed-tumor infiltrating the small bowel.  I discussed my concerns with the patient about this.  #Question worsening abdominal pain -question clinical progression.  Recommend MS Contin 30 twice daily [prescriptions given]; also may continue oxycodone.  # Lytic lesion at L3- positive on PET scan. Lumbar spine shows L3 lesion- on RT [last treatment on 8/27]; no clinical evidence of progression.   # Bone metastases-s/p X-geva; HOLD [because of severe hypocalcemia/multiple electrolyte abnormalities]   #  Anemia-multifactorial hemoglobin - improving Hb  9.4 -monitor for now. We will add retacrictweekly 40,000 units. Approved for retacrict weekly; only one dose of aranesp today/approved by insurance.   # 2 weeks/MD/labs-taxol.  1 week CBC Taxol.  We will add retacrictweekly 40,000 units.

## 2017-10-04 NOTE — Progress Notes (Signed)
[place] OFFICE PROGRESS NOTE  Patient Care Team: Donnie Coffin, MD as PCP - General (Family Medicine) Donnie Coffin, MD as Referring Physician (Family Medicine) Christene Lye, MD (General Surgery) Forest Gleason, MD (Oncology) [name], MD as Consulting Physician (Internal Medicine)  Cancer of left breast [facility])   Staging form: Breast, AJCC 7th Edition     Clinical: Stage IIIB (T4, N1, cM0(i+)) - Signed by Forest Gleason, MD on 08/09/2015     Pathologic: Stage IV (T4, N1, M1) - Signed by Forest Gleason, MD on 08/16/2015    Oncology History   # DEC 2016-  LEFT BREAST CA- LOBULAR CA ER/PR-Pos; her 2 NEG STAGE IV [[id number]- left breast/bil Ax LN/Media LN/RP LN; skeletal mets]; DEC 2016- LETROZOLE + IBRANCE; April 2017- Significant response to treatment- [Dr.Sankar]; s/p Left mastec & ALND [palliative]; cont Ibrance + Letrzole. [facility] 2018/sec to Anemia]  # July 2018- PROGRESSION [PET scan-L1 lytic lesion; Small bowel extrinsic compression s/p lap resection + Lobular cancer; Dr.Davis ]  # AUG 9th th-FASLODEX; SEP 1 week- Verzinio 150 BID;STOP clinical progression [DEC 2018 CT- no obvious mass noted]  # Dec 7th Taxol weekly;  # AUG 2018- s/p RT to L3 lytic lesion  # RIGHT BREAST CA [s/p mastectomy [facility]]  -------------------------------------------------    1.) Retrocolic retrogastric Roux-en-Y (gastrojejunostomy) bypass of distal duodenal obstruction (cpt: 43820) 2.) Segmental jejunal small bowel resection for accessible tumor pathology (cpt: 47425)  INTRAOPERATIVE FINDINGS: Large firm irregular mass encasing duodenum at approximately the ligament of Treitz with diffuse tumor implant studding along much of small intestinal omentum and serosa  # 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   Patient is currently status post cycle #2; day 15 so far.  Patient has not had any recent hospitalizations.  Complains of mild worsening of the states her back pain neck pain is stable.  Currently on Percocet  She denies any significant nausea vomiting or constipation.  Intermittent diarrhea.  REVIEW OF SYSTEMS:  A complete 10 point review of system is done which is negative except mentioned above/history of present illness.   PAST MEDICAL HISTORY :  Past Medical History:  Diagnosis Date  . Abdominal distention   . AKI (acute kidney injury) ([place]) 02/24/2017  . Anemia   . Arthritis   . Asthma   . Back pain, chronic 03/15/2017  . Breast cancer ([place]) 2009   RT MASTECTOMY, DCIS  . 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 Cancer of left breast ([place]) 08/09/2015   T4 N1 M1 tumor, INVASIVE LOBULAR CARCINOMA.  . Carcinoma of overlapping sites of left breast in female, estrogen receptor positive ([place]) 03/22/2016  . COPD (chronic obstructive pulmonary disease) ([place])   . Diabetes ([place]) 02/24/2017  . Diabetes mellitus without complication ([place])   . Duodenal obstruction   . Encounter for nasogastric (NG) tube placement   . Gastric outlet obstruction 02/24/2017  . GERD (gastroesophageal reflux disease)   . Headache    h/o migraines as a child  . Hypertension   . Neck pain  04/19/2016  . Pancreatitis, acute 03/04/2017  . Pneumonia 2015  . Recurrent low back pain 03/15/2017  . Shortness of breath dyspnea    with exertion    PAST SURGICAL HISTORY :   Past Surgical History:  Procedure Laterality Date  . ABDOMINAL HYSTERECTOMY    . BREAST SURGERY Right 2015   mastectomy  . ESOPHAGOGASTRODUODENOSCOPY N/A 02/25/2017    Procedure: ESOPHAGOGASTRODUODENOSCOPY (EGD);  Surgeon: Jonathon Bellows, MD;  Location: [facility] ENDOSCOPY;  Service: Endoscopy;  Laterality: N/A;  . ESOPHAGOGASTRODUODENOSCOPY (EGD) WITH PROPOFOL N/A 03/06/2017   Procedure: ESOPHAGOGASTRODUODENOSCOPY (EGD) WITH PROPOFOL;  Surgeon: [name], MD;  Location: [facility] ENDOSCOPY;  Service: Endoscopy;  Laterality: N/A;  . GASTROJEJUNOSTOMY N/A 03/08/2017   Procedure: Roux-en-Y gastrojejunostomy Bypass of Gastric Outlet Obstruction, small bowel resection;  Surgeon: Vickie Epley, MD;  Location: [facility] ORS;  Service: General;  Laterality: N/A;  . PORTACATH PLACEMENT Right 08/01/2017   Procedure: INSERTION PORT-A-CATH;  Surgeon: Christene Lye, MD;  Location: [facility] ORS;  Service: General;  Laterality: Right;  . SIMPLE MASTECTOMY WITH AXILLARY SENTINEL NODE BIOPSY Left 02/29/2016   Procedure: SIMPLE MASTECTOMY;  Surgeon: Christene Lye, MD;  Location: [facility] ORS;  Service: General;  Laterality: Left;  . TUBAL LIGATION      FAMILY HISTORY :   Family History  Problem Relation Age of Onset  . Lung cancer Father   . Cancer Maternal Aunt   . Breast cancer Neg Hx     SOCIAL HISTORY:   Social History   Tobacco Use  . Smoking status: Former Smoker    Packs/day: 0.25    Years: 15.00    Pack years: 3.75    Types: Cigarettes    Last attempt to quit: 07/23/2017    Years since quitting: 0.2  . Smokeless tobacco: Never Used  . Tobacco comment: currently as of 02-21-16 pt states she is only smoking 2-3 cigarettes per day  Substance Use Topics  . 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 Drug use: No    ALLERGIES:  is allergic to other.  MEDICATIONS:  Current Outpatient Medications  Medication Sig Dispense Refill  . ADVAIR DISKUS 500-50 MCG/DOSE AEPB Inhale 1 puff into the lungs 2 (two) times daily.     [name] albuterol (PROVENTIL  HFA;VENTOLIN HFA) 108 (90 Base) MCG/ACT inhaler Inhale 2 puffs into the lungs every 6 (six) hours as needed for wheezing or shortness of breath.    [name] aspirin 81 MG tablet Take 81 mg by mouth daily.    [name] atorvastatin (LIPITOR) 10 MG tablet Take 10 mg by mouth daily.    [name] docusate sodium (COLACE) 100 MG capsule Take 100 mg by mouth 2 (two) times daily.    . feeding supplement, ENSURE ENLIVE, (ENSURE ENLIVE) LIQD Take 237 mLs by mouth 3 (three) times daily between meals. 237 mL 12  . fluticasone (FLONASE) 50 MCG/ACT nasal spray Place 2 sprays into both nostrils daily.    [name] gabapentin (NEURONTIN) 300 MG capsule Take 300 mg by mouth 2 (two) times daily.    [name] guaiFENesin (MUCINEX) 600 MG 12 hr tablet Take 1 tablet (600 mg total) by mouth 2 (two) times daily as needed for cough or to loosen phlegm.    . lidocaine-prilocaine (EMLA) cream Apply 1 application topically as needed (for port access).     [name] lisinopril (  PRINIVIL,ZESTRIL) 40 MG tablet Take 40 mg by mouth daily.    [name] loperamide (IMODIUM) 2 MG capsule One pill after each loose stool; maximum upto 8 pills a day. (Patient taking differently: Take 2 mg by mouth as needed for diarrhea or loose stools (Up to 8 pills (16 mg) a day). ) 40 capsule 0  . loratadine (CLARITIN) 10 MG tablet Take 10 mg by mouth daily.    . Magnesium Cl-Calcium Carbonate (SLOW MAGNESIUM/CALCIUM) 70-117 MG TBEC Take 1 tablet by mouth 2 (two) times daily. 60 tablet 6  . magnesium hydroxide (MILK OF MAGNESIA) 400 MG/5ML suspension Take 30-45 mLs by mouth daily as needed for mild constipation.    . metoCLOPramide (REGLAN) 10 MG tablet Take 1 tablet (10 mg total) by mouth 4 (four) times daily. 120 tablet 3  . montelukast (SINGULAIR) 10 MG tablet Take 10 mg by mouth at bedtime.    [name] omeprazole (PRILOSEC) 20 MG capsule Take 20 mg by mouth every morning.     . ondansetron (ZOFRAN ODT) 8 MG disintegrating tablet Take 1 tablet (8 mg total) by mouth every 8 (eight) hours as needed for nausea  or vomiting. 20 tablet 3  . oxyCODONE-acetaminophen (PERCOCET/ROXICET) 5-325 MG tablet Take 1 tablet by mouth every 8 (eight) hours as needed for severe pain. 90 tablet 0  . OXYGEN Inhale 2 L into the lungs continuous.     . potassium chloride SA (K-DUR,KLOR-CON) 20 MEQ tablet 1 pill twice a day 60 tablet 3  . prochlorperazine (COMPAZINE) 10 MG tablet Take 1 tablet (10 mg total) by mouth every 6 (six) hours as needed for nausea or vomiting. 30 tablet 3  . senna-docusate (SENOKOT-S) 8.6-50 MG tablet Take 1 tablet by mouth at bedtime as needed for mild constipation.    [name] SPIRIVA HANDIHALER 18 MCG inhalation capsule Place 18 mcg into inhaler and inhale every morning.     [name] acetaminophen (TYLENOL) 325 MG tablet Take 1 tablet (325 mg total) by mouth every 6 (six) hours as needed for mild pain (or Fever >/= 101). (Patient not taking: Reported on 09/20/2017)    . ergocalciferol (VITAMIN D2) 50000 units capsule Take 1 capsule (50,000 Units total) by mouth once a week. (Patient not taking: Reported on 10/04/2017) 12 capsule 1  . morphine (MS CONTIN) 30 MG 12 hr tablet Take 1 tablet (30 mg total) by mouth every 12 (twelve) hours. 60 tablet 0   No current facility-administered medications for this visit.     PHYSICAL EXAMINATION: ECOG PERFORMANCE STATUS: 0 - Asymptomatic  BP 132/69 (BP Location: Left Leg, Patient Position: Sitting)   Pulse (!) 105   Temp 98.2 F (36.8 C) (Tympanic)   Resp 16   Wt 113 lb 6.4 oz (51.4 kg)   BMI 20.74 kg/m   Filed Weights   10/04/17 1026  Weight: 113 lb 6.4 oz (51.4 kg)    GENERAL: Moderately nourished well-developed; Alert, no distress and comfortable. She is in a wheelchair. EYES: no pallor or icterus OROPHARYNX: no thrush or ulceration; poor dentition.   NECK: supple, no masses felt LYMPH:  no palpable lymphadenopathy in the cervical, axillary or inguinal regions LUNGS: clear to auscultation and  No wheeze or crackles HEART/CVS: regular rate & rhythm and no  murmurs; No lower extremity edema ABDOMEN:abdomen soft, non-tender and normal bowel sounds Musculoskeletal:no cyanosis of digits and no clubbing;  PSYCH: alert & oriented x 3 with fluent speech NEURO: no focal motor/sensory deficits SKIN:  no rashes or significant  lesions   LABORATORY DATA:  I have reviewed the data as listed    Component Value Date/Time   NA 139 10/04/2017 1004   NA 133 (L) 04/08/2014 1738   K 3.7 10/04/2017 1004   K 4.2 04/08/2014 1738   CL 105 10/04/2017 1004   CL 99 04/08/2014 1738   CO2 27 10/04/2017 1004   CO2 28 04/08/2014 1738   GLUCOSE 96 10/04/2017 1004   GLUCOSE 116 (H) 04/08/2014 1738   BUN 10 10/04/2017 1004   BUN 5 (L) 04/08/2014 1738   CREATININE 0.45 10/04/2017 1004   CREATININE 0.42 (L) 04/08/2014 1738   CALCIUM 8.9 10/04/2017 1004   CALCIUM 4.9 06/09/2017 1441   PROT 6.0 (L) 10/04/2017 1004   ALBUMIN 2.9 (L) 10/04/2017 1004   AST 20 10/04/2017 1004   ALT 8 (L) 10/04/2017 1004   ALKPHOS 78 10/04/2017 1004   BILITOT 0.6 10/04/2017 1004   GFRNONAA >60 10/04/2017 1004   GFRNONAA >60 04/08/2014 1738   GFRAA >60 10/04/2017 1004   GFRAA >60 04/08/2014 1738    No results found for: SPEP, UPEP  Lab Results  Component Value Date   WBC 6.7 10/04/2017   NEUTROABS 3.9 10/04/2017   HGB 9.4 (L) 10/04/2017   HCT 28.6 (L) 10/04/2017   MCV 86.3 10/04/2017   PLT 485 (H) 10/04/2017      Chemistry      Component Value Date/Time   NA 139 10/04/2017 1004   NA 133 (L) 04/08/2014 1738   K 3.7 10/04/2017 1004   K 4.2 04/08/2014 1738   CL 105 10/04/2017 1004   CL 99 04/08/2014 1738   CO2 27 10/04/2017 1004   CO2 28 04/08/2014 1738   BUN 10 10/04/2017 1004   BUN 5 (L) 04/08/2014 1738   CREATININE 0.45 10/04/2017 1004   CREATININE 0.42 (L) 04/08/2014 1738      Component Value Date/Time   CALCIUM 8.9 10/04/2017 1004   CALCIUM 4.9 06/09/2017 1441   ALKPHOS 78 10/04/2017 1004   AST 20 10/04/2017 1004   ALT 8 (L) 10/04/2017 1004   BILITOT  0.6 10/04/2017 1004       RADIOGRAPHIC STUDIES: I have personally reviewed the radiological images as listed and agreed with the findings in the report. No results found.   ASSESSMENT & PLAN:  Carcinoma of overlapping sites of left breast in female, estrogen receptor positive ([place]) # Metastatic breast cancer/lobular ER/PR positive HER-2/neu negative-July-AUG 2018- progression in the bone L3; also abdomen causing extrinsic compression of the small bowel [status post resection-C discussion below]. On Taxol weekly.  # Proceed with cycle # 3 day-1 today; Labs today reviewed; Acceptable for treatment today- except for- [see below]-anemia hemoglobin 9.2; okay with aranesp today.   # Difficult to assess response given lack of clear visualization of the tumor on the CT scans.  Most recent CT scan January 2018 showed-tumor infiltrating the small bowel.  I discussed my concerns with the patient about this.  #Question worsening abdominal pain -question clinical progression.  Recommend MS Contin 30 twice daily [prescriptions given]; also may continue oxycodone.  # Lytic lesion at L3- positive on PET scan. Lumbar spine shows L3 lesion- on RT [last treatment on 8/27]; no clinical evidence of progression.   # Bone metastases-s/p X-geva; HOLD [because of severe hypocalcemia/multiple electrolyte abnormalities]   #  Anemia-multifactorial hemoglobin - improving Hb  9.4 -monitor for now. We will add retacrictweekly 40,000 units. Approved for retacrict weekly; only one dose of aranesp  today/approved by insurance.   # 2 weeks/MD/labs-taxol.  1 week CBC Taxol.  We will add retacrictweekly 40,000 units.   No orders of the defined types were placed in this encounter.      [name], MD 10/05/2017 7:45 AM

## 2017-10-04 NOTE — Progress Notes (Signed)
Proceed with treatment today per Dr. Rogue Bussing.  Proceed with Aranesp.

## 2017-10-08 ENCOUNTER — Telehealth: Payer: Self-pay | Admitting: *Deleted

## 2017-10-08 ENCOUNTER — Other Ambulatory Visit: Payer: Self-pay | Admitting: *Deleted

## 2017-10-08 MED ORDER — POTASSIUM CHLORIDE CRYS ER 20 MEQ PO TBCR: EXTENDED_RELEASE_TABLET | ORAL | 0 refills | Status: DC

## 2017-10-08 MED ORDER — METOCLOPRAMIDE HCL 10 MG PO TABS: 10.0000 mg | ORAL_TABLET | Freq: Four times a day (QID) | ORAL | 0 refills | Status: AC

## 2017-10-08 NOTE — Telephone Encounter (Signed)
Patient should not need Oxycodone until 2/17 as she did not pick up prescription until 1/15 and was in hospital for 2 days. Patient claims that she did not get her Oxycodone back form hospital and I spoke with nurse on 1C who states that she personally gave a sealed bag of medicine to the daughter the day after patient was discharged because the discharging nurse did not give her medications back and then daughter came back and got a second "black bag" which was being used to give patient her own medications because the hospital did not have those medications. She reports that she did not have daughter sign for them. The Home medications stored by pharmacy does not list which medications were kept in pharmacy, only that 11 bottles  And 2 pill trays were stored.  Pharmacy at [facility] will go ahead and refill her Oxycodone and notate to observe for nay further early fill requests by patient.

## 2017-10-08 NOTE — Telephone Encounter (Signed)
Noted  

## 2017-10-10 ENCOUNTER — Other Ambulatory Visit: Payer: Self-pay | Admitting: *Deleted

## 2017-10-10 DIAGNOSIS — C50812 Malignant neoplasm of overlapping sites of left female breast: Secondary | ICD-10-CM

## 2017-10-10 DIAGNOSIS — Z170 Estrogen receptor positive status [ER+]: Principal | ICD-10-CM

## 2017-10-11 ENCOUNTER — Other Ambulatory Visit: Payer: Self-pay

## 2017-10-11 ENCOUNTER — Inpatient Hospital Stay
Admission: EM | Admit: 2017-10-11 | Discharge: 2017-10-17 | DRG: 871 | Disposition: A | Discharge status: Home Health | Payer: Medicare HMO | Attending: Family Medicine | Admitting: Family Medicine

## 2017-10-11 ENCOUNTER — Emergency Department: Payer: Medicare HMO

## 2017-10-11 ENCOUNTER — Encounter: Payer: Self-pay | Admitting: Emergency Medicine

## 2017-10-11 ENCOUNTER — Inpatient Hospital Stay: Payer: Medicare HMO

## 2017-10-11 DIAGNOSIS — Z7982 Long term (current) use of aspirin: Secondary | ICD-10-CM

## 2017-10-11 DIAGNOSIS — G9341 Metabolic encephalopathy: Secondary | ICD-10-CM | POA: Diagnosis not present

## 2017-10-11 DIAGNOSIS — D649 Anemia, unspecified: Secondary | ICD-10-CM | POA: Diagnosis not present

## 2017-10-11 DIAGNOSIS — E43 Unspecified severe protein-calorie malnutrition: Secondary | ICD-10-CM | POA: Diagnosis present

## 2017-10-11 DIAGNOSIS — C784 Secondary malignant neoplasm of small intestine: Secondary | ICD-10-CM | POA: Diagnosis present

## 2017-10-11 DIAGNOSIS — E861 Hypovolemia: Secondary | ICD-10-CM | POA: Diagnosis present

## 2017-10-11 DIAGNOSIS — G8929 Other chronic pain: Secondary | ICD-10-CM | POA: Diagnosis present

## 2017-10-11 DIAGNOSIS — Z6820 Body mass index (BMI) 20.0-20.9, adult: Secondary | ICD-10-CM

## 2017-10-11 DIAGNOSIS — I10 Essential (primary) hypertension: Secondary | ICD-10-CM | POA: Diagnosis present

## 2017-10-11 DIAGNOSIS — Z9011 Acquired absence of right breast and nipple: Secondary | ICD-10-CM | POA: Diagnosis not present

## 2017-10-11 DIAGNOSIS — C50812 Malignant neoplasm of overlapping sites of left female breast: Secondary | ICD-10-CM

## 2017-10-11 DIAGNOSIS — J9690 Respiratory failure, unspecified, unspecified whether with hypoxia or hypercapnia: Secondary | ICD-10-CM

## 2017-10-11 DIAGNOSIS — E876 Hypokalemia: Secondary | ICD-10-CM | POA: Diagnosis present

## 2017-10-11 DIAGNOSIS — D638 Anemia in other chronic diseases classified elsewhere: Secondary | ICD-10-CM | POA: Diagnosis present

## 2017-10-11 DIAGNOSIS — C7880 Secondary malignant neoplasm of unspecified digestive organ: Secondary | ICD-10-CM | POA: Diagnosis not present

## 2017-10-11 DIAGNOSIS — R6521 Severe sepsis with septic shock: Secondary | ICD-10-CM | POA: Diagnosis present

## 2017-10-11 DIAGNOSIS — K219 Gastro-esophageal reflux disease without esophagitis: Secondary | ICD-10-CM | POA: Diagnosis present

## 2017-10-11 DIAGNOSIS — B955 Unspecified streptococcus as the cause of diseases classified elsewhere: Secondary | ICD-10-CM | POA: Diagnosis not present

## 2017-10-11 DIAGNOSIS — A419 Sepsis, unspecified organism: Secondary | ICD-10-CM | POA: Diagnosis present

## 2017-10-11 DIAGNOSIS — Z79899 Other long term (current) drug therapy: Secondary | ICD-10-CM | POA: Diagnosis not present

## 2017-10-11 DIAGNOSIS — J9621 Acute and chronic respiratory failure with hypoxia: Secondary | ICD-10-CM | POA: Diagnosis not present

## 2017-10-11 DIAGNOSIS — J9610 Chronic respiratory failure, unspecified whether with hypoxia or hypercapnia: Secondary | ICD-10-CM | POA: Diagnosis present

## 2017-10-11 DIAGNOSIS — Z9981 Dependence on supplemental oxygen: Secondary | ICD-10-CM

## 2017-10-11 DIAGNOSIS — D6489 Other specified anemias: Secondary | ICD-10-CM | POA: Diagnosis present

## 2017-10-11 DIAGNOSIS — I248 Other forms of acute ischemic heart disease: Secondary | ICD-10-CM | POA: Diagnosis present

## 2017-10-11 DIAGNOSIS — E785 Hyperlipidemia, unspecified: Secondary | ICD-10-CM | POA: Diagnosis present

## 2017-10-11 DIAGNOSIS — C50811 Malignant neoplasm of overlapping sites of right female breast: Secondary | ICD-10-CM | POA: Diagnosis present

## 2017-10-11 DIAGNOSIS — E1140 Type 2 diabetes mellitus with diabetic neuropathy, unspecified: Secondary | ICD-10-CM | POA: Diagnosis present

## 2017-10-11 DIAGNOSIS — A409 Streptococcal sepsis, unspecified: Secondary | ICD-10-CM | POA: Diagnosis present

## 2017-10-11 DIAGNOSIS — G92 Toxic encephalopathy: Secondary | ICD-10-CM | POA: Diagnosis present

## 2017-10-11 DIAGNOSIS — J449 Chronic obstructive pulmonary disease, unspecified: Secondary | ICD-10-CM | POA: Diagnosis present

## 2017-10-11 DIAGNOSIS — Z170 Estrogen receptor positive status [ER+]: Principal | ICD-10-CM

## 2017-10-11 DIAGNOSIS — I361 Nonrheumatic tricuspid (valve) insufficiency: Secondary | ICD-10-CM | POA: Diagnosis not present

## 2017-10-11 DIAGNOSIS — C7951 Secondary malignant neoplasm of bone: Secondary | ICD-10-CM | POA: Diagnosis not present

## 2017-10-11 DIAGNOSIS — R7881 Bacteremia: Secondary | ICD-10-CM | POA: Diagnosis not present

## 2017-10-11 DIAGNOSIS — C50919 Malignant neoplasm of unspecified site of unspecified female breast: Secondary | ICD-10-CM | POA: Diagnosis present

## 2017-10-11 DIAGNOSIS — Z5111 Encounter for antineoplastic chemotherapy: Secondary | ICD-10-CM | POA: Diagnosis present

## 2017-10-11 LAB — PROTIME-INR
INR: 0.96
PROTHROMBIN TIME: 12.7 s (ref 11.4–15.2)

## 2017-10-11 LAB — URINALYSIS, COMPLETE (UACMP) WITH MICROSCOPIC
Bacteria, UA: NONE SEEN
Bilirubin Urine: NEGATIVE
Glucose, UA: NEGATIVE mg/dL
HGB URINE DIPSTICK: NEGATIVE
Ketones, ur: NEGATIVE mg/dL
LEUKOCYTES UA: NEGATIVE
Nitrite: NEGATIVE
Protein, ur: NEGATIVE mg/dL
SPECIFIC GRAVITY, URINE: 1.011 (ref 1.005–1.030)
pH: 5 (ref 5.0–8.0)

## 2017-10-11 LAB — CBC WITH DIFFERENTIAL/PLATELET
Basophils Absolute: 0.1 10*3/uL (ref 0–0.1)
Basophils Absolute: 0 10*3/uL (ref 0–0.1)
Basophils Relative: 1 %
Basophils Relative: 0 %
EOS ABS: 0 10*3/uL (ref 0–0.7)
Eosinophils Absolute: 0 10*3/uL (ref 0–0.7)
Eosinophils Relative: 0 %
Eosinophils Relative: 0 %
HCT: 30 % — ABNORMAL LOW (ref 35.0–47.0)
HCT: 32.8 % — ABNORMAL LOW (ref 35.0–47.0)
HEMOGLOBIN: 10.8 g/dL — AB (ref 12.0–16.0)
Hemoglobin: 10 g/dL — ABNORMAL LOW (ref 12.0–16.0)
Lymphocytes Relative: 4 %
Lymphocytes Relative: 2 %
Lymphs Abs: 0.7 10*3/uL — ABNORMAL LOW (ref 1.0–3.6)
Lymphs Abs: 0.4 10*3/uL — ABNORMAL LOW (ref 1.0–3.6)
MCH: 28.2 pg (ref 26.0–34.0)
MCH: 27.9 pg (ref 26.0–34.0)
MCHC: 33.4 g/dL (ref 32.0–36.0)
MCHC: 33 g/dL (ref 32.0–36.0)
MCV: 84.6 fL (ref 80.0–100.0)
MCV: 84.6 fL (ref 80.0–100.0)
Monocytes Absolute: 1.1 10*3/uL — ABNORMAL HIGH (ref 0.2–0.9)
Monocytes Absolute: 0.9 10*3/uL (ref 0.2–0.9)
Monocytes Relative: 6 %
Monocytes Relative: 6 %
NEUTROS PCT: 92 %
Neutro Abs: 15.4 10*3/uL — ABNORMAL HIGH (ref 1.4–6.5)
Neutro Abs: 14.6 10*3/uL — ABNORMAL HIGH (ref 1.4–6.5)
Neutrophils Relative %: 89 %
Platelets: 469 10*3/uL — ABNORMAL HIGH (ref 150–440)
Platelets: 482 10*3/uL — ABNORMAL HIGH (ref 150–440)
RBC: 3.55 MIL/uL — ABNORMAL LOW (ref 3.80–5.20)
RBC: 3.87 MIL/uL (ref 3.80–5.20)
RDW: 16.4 % — ABNORMAL HIGH (ref 11.5–14.5)
RDW: 16.2 % — ABNORMAL HIGH (ref 11.5–14.5)
WBC: 17.4 10*3/uL — ABNORMAL HIGH (ref 3.6–11.0)
WBC: 16 10*3/uL — AB (ref 3.6–11.0)

## 2017-10-11 LAB — BASIC METABOLIC PANEL WITH GFR
Anion gap: 11 (ref 5–15)
BUN: <5 mg/dL — ABNORMAL LOW (ref 6–20)
CO2: 28 mmol/L (ref 22–32)
Calcium: 8.9 mg/dL (ref 8.9–10.3)
Chloride: 96 mmol/L — ABNORMAL LOW (ref 101–111)
Creatinine, Ser: 0.38 mg/dL — ABNORMAL LOW (ref 0.44–1.00)
GFR calc Af Amer: >60 mL/min
GFR calc non Af Amer: >60 mL/min
Glucose, Bld: 134 mg/dL — ABNORMAL HIGH (ref 65–99)
Potassium: 3.2 mmol/L — ABNORMAL LOW (ref 3.5–5.1)
Sodium: 135 mmol/L (ref 135–145)

## 2017-10-11 LAB — COMPREHENSIVE METABOLIC PANEL
ALBUMIN: 2.7 g/dL — AB (ref 3.5–5.0)
ALT: 44 U/L (ref 14–54)
AST: 224 U/L — ABNORMAL HIGH (ref 15–41)
Alkaline Phosphatase: 390 U/L — ABNORMAL HIGH (ref 38–126)
Anion gap: 13 (ref 5–15)
BILIRUBIN TOTAL: 1.9 mg/dL — AB (ref 0.3–1.2)
BUN: <5 mg/dL — ABNORMAL LOW (ref 6–20)
CO2: 29 mmol/L (ref 22–32)
Calcium: 9.1 mg/dL (ref 8.9–10.3)
Chloride: 98 mmol/L — ABNORMAL LOW (ref 101–111)
Creatinine, Ser: 0.45 mg/dL (ref 0.44–1.00)
GFR calc Af Amer: >60 mL/min (ref >60)
GFR calc non Af Amer: >60 mL/min (ref >60)
Glucose, Bld: 116 mg/dL — ABNORMAL HIGH (ref 65–99)
POTASSIUM: 2.9 mmol/L — AB (ref 3.5–5.1)
Sodium: 140 mmol/L (ref 135–145)
TOTAL PROTEIN: 6.2 g/dL — AB (ref 6.5–8.1)

## 2017-10-11 LAB — PROCALCITONIN: Procalcitonin: 1.45 ng/mL

## 2017-10-11 LAB — LACTIC ACID, PLASMA
LACTIC ACID, VENOUS: 1.3 mmol/L (ref 0.5–1.9)
Lactic Acid, Venous: 1.5 mmol/L (ref 0.5–1.9)

## 2017-10-11 LAB — INFLUENZA PANEL BY PCR (TYPE A & B)
INFLAPCR: NEGATIVE
INFLBPCR: NEGATIVE

## 2017-10-11 LAB — LIPASE, BLOOD: Lipase: 16 U/L (ref 11–51)

## 2017-10-11 LAB — TROPONIN I
TROPONIN I: 0.14 ng/mL — AB (ref <0.03)
TROPONIN I: 0.3 ng/mL — AB (ref <0.03)

## 2017-10-11 MED ORDER — PIPERACILLIN-TAZOBACTAM 3.375 G IVPB: 3.3750 g | Freq: Three times a day (TID) | INTRAVENOUS | Status: DC

## 2017-10-11 MED ORDER — MAGNESIUM HYDROXIDE 400 MG/5ML PO SUSP
30.0000 mL | Freq: Every day | ORAL | Status: DC | PRN
  Administered 2017-10-13: 30 mL via ORAL
  Filled 2017-10-11: qty 30
  Filled 2017-10-11: qty 60

## 2017-10-11 MED ORDER — POTASSIUM CHLORIDE CRYS ER 20 MEQ PO TBCR
20.0000 meq | EXTENDED_RELEASE_TABLET | Freq: Two times a day (BID) | ORAL | Status: DC
  Administered 2017-10-11: 20 meq via ORAL
  Filled 2017-10-11: qty 1

## 2017-10-11 MED ORDER — SODIUM CHLORIDE 0.9% FLUSH
10.0000 mL | Freq: Two times a day (BID) | INTRAVENOUS | Status: DC
  Administered 2017-10-11 – 2017-10-17 (×11): 10 mL

## 2017-10-11 MED ORDER — ACETAMINOPHEN 325 MG PO TABS
650.0000 mg | ORAL_TABLET | Freq: Four times a day (QID) | ORAL | Status: DC | PRN
  Administered 2017-10-12: 650 mg via ORAL
  Filled 2017-10-11: qty 2

## 2017-10-11 MED ORDER — GABAPENTIN 300 MG PO CAPS
300.0000 mg | ORAL_CAPSULE | Freq: Two times a day (BID) | ORAL | Status: DC
  Administered 2017-10-11 – 2017-10-17 (×11): 300 mg via ORAL
  Filled 2017-10-11 (×12): qty 1

## 2017-10-11 MED ORDER — MONTELUKAST SODIUM 10 MG PO TABS
10.0000 mg | ORAL_TABLET | Freq: Every day | ORAL | Status: DC
  Administered 2017-10-11 – 2017-10-13 (×3): 10 mg via ORAL
  Filled 2017-10-11 (×3): qty 1

## 2017-10-11 MED ORDER — SODIUM CHLORIDE 0.9 % IV BOLUS (SEPSIS)
250.0000 mL | Freq: Once | INTRAVENOUS | Status: AC
  Administered 2017-10-11: 250 mL via INTRAVENOUS

## 2017-10-11 MED ORDER — CEFEPIME HCL 2 G IJ SOLR
2.0000 g | Freq: Three times a day (TID) | INTRAMUSCULAR | Status: DC
  Administered 2017-10-11 – 2017-10-13 (×5): 2 g via INTRAVENOUS
  Filled 2017-10-11 (×9): qty 2

## 2017-10-11 MED ORDER — SODIUM CHLORIDE 0.9 % IV SOLN
INTRAVENOUS | Status: DC
  Administered 2017-10-11 – 2017-10-12 (×2): via INTRAVENOUS

## 2017-10-11 MED ORDER — FLUTICASONE PROPIONATE 50 MCG/ACT NA SUSP
2.0000 | Freq: Every day | NASAL | Status: DC
  Administered 2017-10-13 – 2017-10-17 (×5): 2 via NASAL
  Filled 2017-10-11 (×2): qty 16

## 2017-10-11 MED ORDER — PANTOPRAZOLE SODIUM 40 MG PO TBEC
40.0000 mg | DELAYED_RELEASE_TABLET | Freq: Every day | ORAL | Status: DC
  Administered 2017-10-13 – 2017-10-14 (×2): 40 mg via ORAL
  Filled 2017-10-11 (×3): qty 1

## 2017-10-11 MED ORDER — ENOXAPARIN SODIUM 40 MG/0.4ML ~~LOC~~ SOLN
40.0000 mg | SUBCUTANEOUS | Status: DC
  Administered 2017-10-11 – 2017-10-16 (×5): 40 mg via SUBCUTANEOUS
  Filled 2017-10-11 (×5): qty 0.4

## 2017-10-11 MED ORDER — ASPIRIN EC 81 MG PO TBEC
81.0000 mg | DELAYED_RELEASE_TABLET | Freq: Every day | ORAL | Status: DC
  Administered 2017-10-13 – 2017-10-17 (×5): 81 mg via ORAL
  Filled 2017-10-11 (×6): qty 1

## 2017-10-11 MED ORDER — ACETAMINOPHEN 650 MG RE SUPP: 650.0000 mg | Freq: Four times a day (QID) | RECTAL | Status: DC | PRN

## 2017-10-11 MED ORDER — ALBUTEROL SULFATE (2.5 MG/3ML) 0.083% IN NEBU: 3.0000 mL | INHALATION_SOLUTION | Freq: Four times a day (QID) | RESPIRATORY_TRACT | Status: DC | PRN

## 2017-10-11 MED ORDER — SODIUM CHLORIDE 0.9% FLUSH: 10.0000 mL | INTRAVENOUS | Status: DC | PRN

## 2017-10-11 MED ORDER — DOCUSATE SODIUM 100 MG PO CAPS
100.0000 mg | ORAL_CAPSULE | Freq: Two times a day (BID) | ORAL | Status: DC
  Administered 2017-10-11 – 2017-10-14 (×5): 100 mg via ORAL
  Filled 2017-10-11 (×6): qty 1

## 2017-10-11 MED ORDER — VANCOMYCIN HCL IN DEXTROSE 750-5 MG/150ML-% IV SOLN
750.0000 mg | INTRAVENOUS | Status: DC
  Administered 2017-10-12: 750 mg via INTRAVENOUS
  Filled 2017-10-11 (×2): qty 150

## 2017-10-11 MED ORDER — MOMETASONE FURO-FORMOTEROL FUM 200-5 MCG/ACT IN AERO
2.0000 | INHALATION_SPRAY | Freq: Two times a day (BID) | RESPIRATORY_TRACT | Status: DC
  Administered 2017-10-11 – 2017-10-17 (×11): 2 via RESPIRATORY_TRACT
  Filled 2017-10-11 (×2): qty 8.8

## 2017-10-11 MED ORDER — IOPAMIDOL (ISOVUE-300) INJECTION 61%
75.0000 mL | Freq: Once | INTRAVENOUS | Status: AC | PRN
  Administered 2017-10-11: 75 mL via INTRAVENOUS

## 2017-10-11 MED ORDER — SODIUM CHLORIDE 0.9 % IV BOLUS (SEPSIS)
1000.0000 mL | Freq: Once | INTRAVENOUS | Status: AC
  Administered 2017-10-11: 1000 mL via INTRAVENOUS

## 2017-10-11 MED ORDER — ORAL CARE MOUTH RINSE
15.0000 mL | Freq: Two times a day (BID) | OROMUCOSAL | Status: DC
  Administered 2017-10-11 – 2017-10-16 (×5): 15 mL via OROMUCOSAL

## 2017-10-11 MED ORDER — ONDANSETRON 4 MG PO TBDP
8.0000 mg | ORAL_TABLET | Freq: Three times a day (TID) | ORAL | Status: DC | PRN
  Filled 2017-10-11: qty 1

## 2017-10-11 MED ORDER — ATORVASTATIN CALCIUM 10 MG PO TABS
10.0000 mg | ORAL_TABLET | Freq: Every day | ORAL | Status: DC
  Administered 2017-10-11 – 2017-10-16 (×6): 10 mg via ORAL
  Filled 2017-10-11 (×3): qty 1
  Filled 2017-10-11: qty 0.5
  Filled 2017-10-11 (×2): qty 1

## 2017-10-11 MED ORDER — ONDANSETRON HCL 4 MG/2ML IJ SOLN: 4.0000 mg | Freq: Four times a day (QID) | INTRAMUSCULAR | Status: DC | PRN

## 2017-10-11 MED ORDER — SODIUM CHLORIDE 0.9 % IV BOLUS (SEPSIS)
500.0000 mL | Freq: Once | INTRAVENOUS | Status: AC
  Administered 2017-10-11: 500 mL via INTRAVENOUS

## 2017-10-11 MED ORDER — VANCOMYCIN HCL IN DEXTROSE 1-5 GM/200ML-% IV SOLN
1000.0000 mg | Freq: Once | INTRAVENOUS | Status: AC
  Administered 2017-10-11: 1000 mg via INTRAVENOUS
  Filled 2017-10-11: qty 200

## 2017-10-11 MED ORDER — ONDANSETRON HCL 4 MG PO TABS: 4.0000 mg | ORAL_TABLET | Freq: Four times a day (QID) | ORAL | Status: DC | PRN

## 2017-10-11 MED ORDER — PIPERACILLIN-TAZOBACTAM 3.375 G IVPB 30 MIN
3.3750 g | Freq: Once | INTRAVENOUS | Status: AC
  Administered 2017-10-11: 3.375 g via INTRAVENOUS
  Filled 2017-10-11: qty 50

## 2017-10-11 MED ORDER — OXYCODONE-ACETAMINOPHEN 5-325 MG PO TABS
1.0000 | ORAL_TABLET | Freq: Three times a day (TID) | ORAL | Status: DC | PRN
  Administered 2017-10-11 – 2017-10-16 (×4): 1 via ORAL
  Filled 2017-10-11 (×5): qty 1

## 2017-10-11 NOTE — ED Provider Notes (Signed)
[facility] Emergency Department Provider Note    None    (approximate)  I have reviewed the triage vital signs and the nursing notes.   HISTORY  Chief Complaint Code Sepsis    HPI Sophia Nelson is a 72 y.o. female with a history of metastatic breast cancer presents with chief complaint of neurolyse malaise confusion fever and dehydration with one episode of diarrhea today.  Was supposed to have chemotherapy today but due to her not feeling well she went to clinic, was late for her appointment, was found to be febrile tachycardic and mildly hypotensive so she was sent to the ER for evaluation.  Is complaining of some epigastric discomfort.  No report of any recent antibiotics.  Was recently admitted to the hospital.  Does have chronic respiratory failure on 2 L nasal cannula for history of COPD.  No history of CHF.  Past Medical History:  Diagnosis Date  . Abdominal distention   . AKI (acute kidney injury) ([place]) 02/24/2017  . Anemia   . Arthritis   . Asthma   . Back pain, chronic 03/15/2017  . Breast cancer ([place]) 2009   RT MASTECTOMY, DCIS  . 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 Cancer of left breast ([place]) 08/09/2015   T4 N1 M1 tumor, INVASIVE LOBULAR CARCINOMA.  . Carcinoma of overlapping sites of left breast in female, estrogen receptor positive ([place]) 03/22/2016  . COPD (chronic obstructive pulmonary disease) ([place])   . Diabetes ([place]) 02/24/2017  . Diabetes mellitus without complication ([place])   . Duodenal obstruction   . Encounter for nasogastric (NG) tube placement   . Gastric outlet obstruction 02/24/2017  . GERD (gastroesophageal reflux disease)   . Headache    h/o migraines as a child  . Hypertension   . Neck pain 04/19/2016  . Pancreatitis, acute 03/04/2017  . Pneumonia 2015  . Recurrent low back pain 03/15/2017  . Shortness of breath dyspnea    with  exertion   Family History  Problem Relation Age of Onset  . Lung cancer Father   . Cancer Maternal Aunt   . Dementia Mother   . Breast cancer Neg Hx    Past Surgical History:  Procedure Laterality Date  . ABDOMINAL HYSTERECTOMY    . BREAST SURGERY Right 2015   mastectomy  . ESOPHAGOGASTRODUODENOSCOPY N/A 02/25/2017   Procedure: ESOPHAGOGASTRODUODENOSCOPY (EGD);  Surgeon: Jonathon Bellows, MD;  Location: [facility] ENDOSCOPY;  Service: Endoscopy;  Laterality: N/A;  . ESOPHAGOGASTRODUODENOSCOPY (EGD) WITH PROPOFOL N/A 03/06/2017   Procedure: ESOPHAGOGASTRODUODENOSCOPY (EGD) WITH PROPOFOL;  Surgeon: [name], MD;  Location: [facility] ENDOSCOPY;  Service: Endoscopy;  Laterality: N/A;  . GASTROJEJUNOSTOMY N/A 03/08/2017   Procedure: Roux-en-Y gastrojejunostomy Bypass of Gastric Outlet Obstruction, small bowel resection;  Surgeon: Vickie Epley, MD;  Location: [facility] ORS;  Service: General;  Laterality: N/A;  . PORTACATH PLACEMENT Right 08/01/2017   Procedure: INSERTION PORT-A-CATH;  Surgeon: Christene Lye, MD;  Location: [facility] ORS;  Service: General;  Laterality: Right;  . SIMPLE MASTECTOMY WITH AXILLARY SENTINEL NODE BIOPSY Left 02/29/2016   Procedure: SIMPLE MASTECTOMY;  Surgeon: Christene Lye, MD;  Location: [facility] ORS;  Service: General;  Laterality: Left;  . TUBAL LIGATION     Patient Active Problem List   Diagnosis Date Noted  . Sepsis ([place]) 10/11/2017  . Acute respiratory distress   . Symptomatic anemia   . Acute on chronic  respiratory failure with hypoxia ([place]) 09/21/2017  . Cholecystitis 08/07/2017  . Syncope   . Metastatic breast cancer ([place])   . Hypotension   . Palliative care by specialist   . Goals of care, counseling/discussion   . Severe sepsis with septic shock ([place]) 06/09/2017  . Hypocalcemia 05/30/2017  . 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 Acute back pain 03/28/2017  . Back pain, chronic 03/15/2017  . Chronic lumbar pain 03/15/2017  . Recurrent low back pain  03/15/2017  . Duodenal obstruction   . Pancreatitis, acute 03/04/2017  . Abdominal distention   . Encounter for nasogastric (NG) tube placement   . Gastric outlet obstruction 02/24/2017  . AKI (acute kidney injury) ([place]) 02/24/2017  . GERD (gastroesophageal reflux disease) 02/24/2017  . Diabetes ([place]) 02/24/2017  . COPD (chronic obstructive pulmonary disease) ([place]) 02/24/2017  . Neck pain 04/19/2016  . Carcinoma of overlapping sites of left breast in female, estrogen receptor positive ([place]) 03/22/2016      Prior to Admission medications   Medication Sig Start Date End Date Taking? Authorizing Provider  acetaminophen (TYLENOL) 325 MG tablet Take 1 tablet (325 mg total) by mouth every 6 (six) hours as needed for mild pain (or Fever >/= 101). Patient not taking: Reported on 09/20/2017 06/17/17   Nicholes Mango, MD  ADVAIR DISKUS 500-50 MCG/DOSE AEPB Inhale 1 puff into the lungs 2 (two) times daily.  05/04/15   [provider]  albuterol (PROVENTIL HFA;VENTOLIN HFA) 108 (90 Base) MCG/ACT inhaler Inhale 2 puffs into the lungs every 6 (six) hours as needed for wheezing or shortness of breath.    [provider]  aspirin 81 MG tablet Take 81 mg by mouth daily.    [provider]  atorvastatin (LIPITOR) 10 MG tablet Take 10 mg by mouth daily.    [provider]  docusate sodium (COLACE) 100 MG capsule Take 100 mg by mouth 2 (two) times daily.    [provider]  ergocalciferol (VITAMIN D2) 50000 units capsule Take 1 capsule (50,000 Units total) by mouth once a week. Patient not taking: Reported on 10/04/2017 05/30/17   [name], MD  feeding supplement, ENSURE ENLIVE, (ENSURE ENLIVE) LIQD Take 237 mLs by mouth 3 (three) times daily between meals. 03/13/17   Fritzi Mandes, MD  fluticasone (FLONASE) 50 MCG/ACT nasal spray Place 2 sprays into both nostrils daily.    [provider]  gabapentin (NEURONTIN) 300 MG capsule Take 300 mg by mouth 2  (two) times daily.    [provider]  guaiFENesin (MUCINEX) 600 MG 12 hr tablet Take 1 tablet (600 mg total) by mouth 2 (two) times daily as needed for cough or to loosen phlegm. 06/17/17   Nicholes Mango, MD  lidocaine-prilocaine (EMLA) cream Apply 1 application topically as needed (for port access).  04/26/17   [provider]  lisinopril (PRINIVIL,ZESTRIL) 40 MG tablet Take 40 mg by mouth daily.    [provider]  loperamide (IMODIUM) 2 MG capsule One pill after each loose stool; maximum upto 8 pills a day. Patient taking differently: Take 2 mg by mouth as needed for diarrhea or loose stools (Up to 8 pills (16 mg) a day).  05/02/17   [name], MD  loratadine (CLARITIN) 10 MG tablet Take 10 mg by mouth daily.    [provider]  Magnesium Cl-Calcium Carbonate (SLOW MAGNESIUM/CALCIUM) 70-117 MG TBEC Take 1 tablet by mouth 2 (two) times daily. 06/28/17   [name], MD  magnesium hydroxide (MILK OF MAGNESIA) 400 MG/5ML suspension Take 30-45 mLs by mouth daily as needed for mild constipation.    [provider]  metoCLOPramide (REGLAN) 10 MG tablet Take 1 tablet (10 mg total) by mouth 4 (four) times daily. 10/08/17   [name], MD  montelukast (SINGULAIR) 10 MG tablet Take 10 mg by mouth at bedtime.    [provider]  morphine (MS CONTIN) 30 MG 12 hr tablet Take 1 tablet (30 mg total) by mouth every 12 (twelve) hours. 10/04/17   [name], MD  omeprazole (PRILOSEC) 20 MG capsule Take 20 mg by mouth every morning.  06/23/15   [provider]  ondansetron (ZOFRAN ODT) 8 MG disintegrating tablet Take 1 tablet (8 mg total) by mouth every 8 (eight) hours as needed for nausea or vomiting. 07/26/17   [name], MD  oxyCODONE-acetaminophen (PERCOCET/ROXICET) 5-325 MG tablet Take 1 tablet by mouth every 8 (eight) hours as needed for severe pain. 10/04/17   [name], MD  potassium  chloride SA (K-DUR,KLOR-CON) 20 MEQ tablet 1 pill twice a day 10/08/17   [name], MD  prochlorperazine (COMPAZINE) 10 MG tablet Take 1 tablet (10 mg total) by mouth every 6 (six) hours as needed for nausea or vomiting. 07/26/17   [name], MD  senna-docusate (SENOKOT-S) 8.6-50 MG tablet Take 1 tablet by mouth at bedtime as needed for mild constipation. 06/17/17   Gouru, Illene Silver, MD  SPIRIVA HANDIHALER 18 MCG inhalation capsule Place 18 mcg into inhaler and inhale every morning.  05/04/15   [provider]    Allergies Other    Social History Social History   Tobacco Use  . Smoking status: Former Smoker    Packs/day: 0.25    Years: 15.00    Pack years: 3.75    Types: Cigarettes    Last attempt to quit: 07/23/2017    Years since quitting: 0.2  . Smokeless tobacco: Never Used  . Tobacco comment: currently as of 02-21-16 pt states she is only smoking 2-3 cigarettes per day  Substance Use Topics  . 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 Drug use: No    Review of Systems Patient denies headaches, rhinorrhea, blurry vision, numbness, shortness of breath, chest pain, edema, cough, abdominal pain, nausea, vomiting, diarrhea, dysuria, fevers, rashes or hallucinations unless otherwise stated above in HPI. ____________________________________________   PHYSICAL EXAM:  VITAL SIGNS: Vitals:   10/11/17 1708 10/11/17 1713  BP:    Pulse: (!) 129   Resp: (!) 22   Temp:  100 F (37.8 C)  SpO2: 100%     Constitutional: Alert but very ill appearing, no respiratory distress but tachycardic and moaning Eyes: conjunctivae are normal.  Head: Atraumatic. Nose: No congestion/rhinnorhea. Mouth/Throat: Mucous membranes are dry  Neck: No stridor. Painless ROM.  Cardiovascular:tachycardic, regular rhythm. Grossly normal heart sounds.  Good  peripheral circulation. Respiratory: Normal respiratory effort.  No retractions. Lungs with inspiratory crackles in bilateral bases Gastrointestinal: Soft and nontender. No distention. No abdominal bruits. No CVA tenderness. Genitourinary:  Musculoskeletal: No lower extremity tenderness nor edema.  No joint effusions. Neurologic:  No gross focal neurologic deficits are appreciated. No facial droop Skin:  Skin is warm, dry and intact. No rash noted.  ____________________________________________   LABS (all labs ordered are listed, but  only abnormal results are displayed)  Results for orders placed or performed during the hospital encounter of 10/11/17 (from the past 24 hour(s))  Troponin I     Status: Abnormal   Collection Time: 10/11/17  2:18 PM  Result Value Ref Range   Troponin I 0.14 (HH) <0.03 ng/mL  Procalcitonin     Status: None   Collection Time: 10/11/17  2:18 PM  Result Value Ref Range   Procalcitonin 1.45 ng/mL  Comprehensive metabolic panel     Status: Abnormal   Collection Time: 10/11/17  3:22 PM  Result Value Ref Range   Sodium 140 135 - 145 mmol/L   Potassium 2.9 (L) 3.5 - 5.1 mmol/L   Chloride 98 (L) 101 - 111 mmol/L   CO2 29 22 - 32 mmol/L   Glucose, Bld 116 (H) 65 - 99 mg/dL   BUN <5 (L) 6 - 20 mg/dL   Creatinine, Ser 0.45 0.44 - 1.00 mg/dL   Calcium 9.1 8.9 - 10.3 mg/dL   Total Protein 6.2 (L) 6.5 - 8.1 g/dL   Albumin 2.7 (L) 3.5 - 5.0 g/dL   AST 224 (H) 15 - 41 U/L   ALT 44 14 - 54 U/L   Alkaline Phosphatase 390 (H) 38 - 126 U/L   Total Bilirubin 1.9 (H) 0.3 - 1.2 mg/dL   GFR calc non Af Amer >60 >60 mL/min   GFR calc Af Amer >60 >60 mL/min   Anion gap 13 5 - 15  Lactic acid, plasma     Status: None   Collection Time: 10/11/17  3:22 PM  Result Value Ref Range   Lactic Acid, Venous 1.5 0.5 - 1.9 mmol/L  CBC with Differential     Status: Abnormal   Collection Time: 10/11/17  3:22 PM  Result Value Ref Range   WBC 16.0 (H) 3.6 - 11.0 K/uL   RBC 3.87  3.80 - 5.20 MIL/uL   Hemoglobin 10.8 (L) 12.0 - 16.0 g/dL   HCT 32.8 (L) 35.0 - 47.0 %   MCV 84.6 80.0 - 100.0 fL   MCH 27.9 26.0 - 34.0 pg   MCHC 33.0 32.0 - 36.0 g/dL   RDW 16.2 (H) 11.5 - 14.5 %   Platelets 482 (H) 150 - 440 K/uL   Neutrophils Relative % 92 %   Neutro Abs 14.6 (H) 1.4 - 6.5 K/uL   Lymphocytes Relative 2 %   Lymphs Abs 0.4 (L) 1.0 - 3.6 K/uL   Monocytes Relative 6 %   Monocytes Absolute 0.9 0.2 - 0.9 K/uL   Eosinophils Relative 0 %   Eosinophils Absolute 0.0 0 - 0.7 K/uL   Basophils Relative 0 %   Basophils Absolute 0.0 0 - 0.1 K/uL  Protime-INR     Status: None   Collection Time: 10/11/17  3:22 PM  Result Value Ref Range   Prothrombin Time 12.7 11.4 - 15.2 seconds   INR 0.96   Urinalysis, Complete w Microscopic     Status: Abnormal   Collection Time: 10/11/17  3:22 PM  Result Value Ref Range   Color, Urine YELLOW (A) YELLOW   APPearance CLEAR (A) CLEAR   Specific Gravity, Urine 1.011 1.005 - 1.030   pH 5.0 5.0 - 8.0   Glucose, UA NEGATIVE NEGATIVE mg/dL   Hgb urine dipstick NEGATIVE NEGATIVE   Bilirubin Urine NEGATIVE NEGATIVE   Ketones, ur NEGATIVE NEGATIVE mg/dL   Protein, ur NEGATIVE NEGATIVE mg/dL   Nitrite NEGATIVE NEGATIVE   Leukocytes, UA NEGATIVE NEGATIVE  RBC / HPF 0-5 0 - 5 RBC/hpf   WBC, UA 6-30 0 - 5 WBC/hpf   Bacteria, UA NONE SEEN NONE SEEN   Squamous Epithelial / LPF 0-5 (A) NONE SEEN   Mucus PRESENT    Hyaline Casts, UA PRESENT   Influenza panel by PCR (type A & B)     Status: None   Collection Time: 10/11/17  3:22 PM  Result Value Ref Range   Influenza A By PCR NEGATIVE NEGATIVE   Influenza B By PCR NEGATIVE NEGATIVE  Lipase, blood     Status: None   Collection Time: 10/11/17  3:22 PM  Result Value Ref Range   Lipase 16 11 - 51 U/L   ____________________________________________  EKG My review and personal interpretation at Time: 16:07   Indication: ams  Rate: 135  Rhythm: sinus tach Axis: normal Other: nonspecific st  changes, likely rate dependent, no stemi ____________________________________________  RADIOLOGY  I personally reviewed all radiographic images ordered to evaluate for the above acute complaints and reviewed radiology reports and findings.  These findings were personally discussed with the patient.  Please see medical record for radiology report.  ____________________________________________   PROCEDURES  Procedure(s) performed:  .Critical Care Performed by: Merlyn Lot, MD Authorized by: Merlyn Lot, MD   Critical care provider statement:    Critical care time (minutes):  45   Critical care time was exclusive of:  Separately billable procedures and treating other patients   Critical care was necessary to treat or prevent imminent or life-threatening deterioration of the following conditions:  Sepsis   Critical care was time spent personally by me on the following activities:  Development of treatment plan with patient or surrogate, discussions with consultants, evaluation of patient's response to treatment, examination of patient, obtaining history from patient or surrogate, ordering and performing treatments and interventions, ordering and review of laboratory studies, ordering and review of radiographic studies, pulse oximetry, re-evaluation of patient's condition and review of old charts      Critical Care performed: yes ____________________________________________   INITIAL IMPRESSION / [place] / ED COURSE  Pertinent labs & imaging results that were available during my care of the patient were reviewed by me and considered in my medical decision making (see chart for details).  DDX: Dehydration, sepsis, pna, uti, hypoglycemia, cva, drug effect, withdrawal, encephalitis   Sophia Nelson is a 72 y.o. who presents to the ED with symptoms as described above.  Patient found to be febrile tachycardic but normotensive.  Patient ill-appearing and most  likely secondary to sepsis.  Blood work and radiographs to be sent for the above differential.  Will provide IV fluids.  Clinical Course as of Oct 11 1829  Fri Oct 11, 2017  1735 Patient reassessed.  States that she is feeling somewhat improved.  CT imaging does show worsening metastatic burden.  This may be result of patient's Sirs criteria as there is no other evidence of infectious process however based on her illness do feel patient will require admission for further medical management.  IV antibiotics have infused.  The flu is negative.  CT imaging demonstrates possible abscess but I do not identify anything that appears acutely surgical.  Patient will be admitted to the hospital for further management.  Have discussed with the patient and available family all diagnostics and treatments performed thus far and all questions were answered to the best of my ability. The patient demonstrates understanding and agreement with plan.   [PR]  Clinical Course User Index [PR] Merlyn Lot, MD     ____________________________________________   FINAL CLINICAL IMPRESSION(S) / ED DIAGNOSES  Final diagnoses:  Sepsis, due to unspecified organism [facility])      NEW MEDICATIONS STARTED DURING THIS VISIT:  New Prescriptions   No medications on file     Note:  This document was prepared using Dragon voice recognition software and may include unintentional dictation errors.    Merlyn Lot, MD 10/11/17 [phone]

## 2017-10-11 NOTE — ED Notes (Signed)
Pt having BM on bedpan. Pt hesitant to allow nursing staff to assist with toileting. States that she does not want to do it because she is cold. Pt has blankets. Room temperature over 80F. Pt states "why don't yall just leave me alone". Pt does agree for in and out cath to collect urine sample.

## 2017-10-11 NOTE — ED Notes (Signed)
Report to Stephen, RN.

## 2017-10-11 NOTE — ED Notes (Signed)
Assisted for in and out cath by [facility], NS

## 2017-10-11 NOTE — ED Notes (Addendum)
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 Went to get chemo today and was unable to do so due to fever. Pt reports generalized fatigue and weakness over past few days .

## 2017-10-11 NOTE — Progress Notes (Signed)
CODE SEPSIS - PHARMACY COMMUNICATION  **Broad Spectrum Antibiotics should be administered within 1 hour of Sepsis diagnosis**  Time Code Sepsis Called/Page Received: 1530  Antibiotics Ordered: 1522  Time of 1st antibiotic administration: 1550 (Zosyn + Vanc)   Additional action taken by pharmacy: N/A  If necessary, Name of Provider/Nurse Contacted: [place] ,PharmD Pharmacy Resident  10/11/2017  3:55 PM

## 2017-10-11 NOTE — ED Notes (Signed)
Date and time results received: 10/11/17 5:20 PM  (use smartphrase ".now" to insert current time)  Troponin 0.14 Name of Provider Notified: Dr. Merlyn Lot

## 2017-10-11 NOTE — Progress Notes (Signed)
ANTIBIOTIC CONSULT NOTE - INITIAL  Pharmacy Consult for Zosyn and vancomycin Indication: sepsis  Allergies  Allergen Reactions  . Other Other (See Comments)    Onions(that grow in the yard-pt can eat onions without problems) and dust mite Sneezing, cough, runny nose    Patient Measurements: Height: 5\' 2"  (157.5 cm) Weight: 113 lb (51.3 kg) IBW/kg (Calculated) : 50.1 Adjusted Body Weight:   Vital Signs: Temp: 101.9 F (38.8 C) (02/15 1505) Temp Source: Oral (02/15 1505) BP: 93/51 (02/15 1505) Pulse Rate: 146 (02/15 1505) Intake/Output from previous day: No intake/output data recorded. Intake/Output from this shift: No intake/output data recorded.  Labs: Recent Labs    10/11/17 1418  WBC 17.4*  HGB 10.0*  PLT 469*  CREATININE 0.38*   Estimated Creatinine Clearance: 52.5 mL/min (A) (by C-G formula based on SCr of 0.38 mg/dL (L)). No results for input(s): VANCOTROUGH, VANCOPEAK, VANCORANDOM, GENTTROUGH, GENTPEAK, GENTRANDOM, TOBRATROUGH, TOBRAPEAK, TOBRARND, AMIKACINPEAK, AMIKACINTROU, AMIKACIN in the last 72 hours.   Microbiology: Recent Results (from the past 720 hour(s))  Blood Culture (routine x 2)     Status: Abnormal   Collection Time: 09/21/17  3:44 AM  Result Value Ref Range Status   Specimen Description   Final    BLOOD PORTA CATH Performed at [facility], [address] [place], [place] [address]    Special Requests   Final    BOTTLES DRAWN AEROBIC AND ANAEROBIC Blood Culture results may not be optimal due to an excessive volume of blood received in culture bottles Performed at [facility], [address] [place], [place] [address]    Culture  Setup Time   Final    GRAM NEGATIVE RODS ANAEROBIC BOTTLE ONLY CRITICAL RESULT CALLED TO, READ BACK BY AND VERIFIED WITH: [place] ON 09/21/17 AT 2000 QSD Performed at [facility], [place] [address], [place], [place] [address]    Culture ESCHERICHIA COLI (A)  Final   Report Status  09/24/2017 FINAL  Final   Organism ID, Bacteria ESCHERICHIA COLI  Final      Susceptibility   Escherichia coli - MIC*    AMPICILLIN 8 SENSITIVE Sensitive     CEFAZOLIN <=4 SENSITIVE Sensitive     CEFEPIME <=1 SENSITIVE Sensitive     CEFTAZIDIME <=1 SENSITIVE Sensitive     CEFTRIAXONE <=1 SENSITIVE Sensitive     CIPROFLOXACIN <=0.25 SENSITIVE Sensitive     GENTAMICIN <=1 SENSITIVE Sensitive     IMIPENEM <=0.25 SENSITIVE Sensitive     TRIMETH/SULFA <=20 SENSITIVE Sensitive     AMPICILLIN/SULBACTAM 4 SENSITIVE Sensitive     PIP/TAZO <=4 SENSITIVE Sensitive     Extended ESBL NEGATIVE Sensitive     * ESCHERICHIA COLI  Urine culture     Status: None   Collection Time: 09/21/17  3:44 AM  Result Value Ref Range Status   Specimen Description   Final    URINE, CLEAN CATCH Performed at [facility], [address], [place], [place] [address]    Special Requests   Final    NONE Performed at [facility], [address] [place], [place] [address]    Culture   Final    NO GROWTH Performed at [facility], [place] [address], [place], [place] [address]    Report Status 09/22/2017 FINAL  Final  Blood Culture ID Panel (Reflexed)     Status: Abnormal   Collection Time: 09/21/17  3:44 AM  Result Value Ref Range Status   Enterococcus  species NOT DETECTED NOT DETECTED Final   Vancomycin resistance NOT DETECTED NOT DETECTED Final   Listeria monocytogenes NOT DETECTED NOT DETECTED Final   Staphylococcus species NOT DETECTED NOT DETECTED Final   Staphylococcus aureus NOT DETECTED NOT DETECTED Final   Methicillin resistance NOT DETECTED NOT DETECTED Final   Streptococcus species NOT DETECTED NOT DETECTED Final   Streptococcus agalactiae NOT DETECTED NOT DETECTED Final   Streptococcus pneumoniae NOT DETECTED NOT DETECTED Final   Streptococcus pyogenes NOT DETECTED NOT DETECTED Final   Acinetobacter baumannii NOT DETECTED NOT DETECTED Final   Enterobacteriaceae species  DETECTED (A) NOT DETECTED Final    Comment: CRITICAL RESULT CALLED TO, READ BACK BY AND VERIFIED WITH: NATE Silena Wyss ON 09/21/17 AT 2000 QSD Enterobacteriaceae represent a large family of gram-negative bacteria, not a single organism.    Enterobacter cloacae complex NOT DETECTED NOT DETECTED Final   Escherichia coli DETECTED (A) NOT DETECTED Final    Comment: CRITICAL RESULT CALLED TO, READ BACK BY AND VERIFIED WITH: NATE Mal Asher ON 09/21/17 AT 2000 QSD    Klebsiella oxytoca NOT DETECTED NOT DETECTED Final   Klebsiella pneumoniae NOT DETECTED NOT DETECTED Final   Proteus species NOT DETECTED NOT DETECTED Final   Serratia marcescens NOT DETECTED NOT DETECTED Final   Carbapenem resistance NOT DETECTED NOT DETECTED Final   Haemophilus influenzae NOT DETECTED NOT DETECTED Final   Neisseria meningitidis NOT DETECTED NOT DETECTED Final   Pseudomonas aeruginosa NOT DETECTED NOT DETECTED Final   Candida albicans NOT DETECTED NOT DETECTED Final   Candida glabrata NOT DETECTED NOT DETECTED Final   Candida krusei NOT DETECTED NOT DETECTED Final   Candida parapsilosis NOT DETECTED NOT DETECTED Final   Candida tropicalis NOT DETECTED NOT DETECTED Final    Comment: Performed at [facility], [address] [place], [place] [address]  Blood Culture (routine x 2)     Status: None   Collection Time: 09/21/17  4:33 AM  Result Value Ref Range Status   Specimen Description BLOOD LEFT ANTECUBITAL  Final   Special Requests   Final    BOTTLES DRAWN AEROBIC AND ANAEROBIC Blood Culture adequate volume   Culture   Final    NO GROWTH 5 DAYS Performed at [facility], [address], [place], [place] [address]    Report Status 09/26/2017 FINAL  Final    Medical History: Past Medical History:  Diagnosis Date  . Abdominal distention   . AKI (acute kidney injury) ([place]) 02/24/2017  . Anemia   . Arthritis   . Asthma   . Back pain, chronic 03/15/2017  . Breast cancer ([place]) 2009   RT  MASTECTOMY, DCIS  . Cancer of intra-abdominal ([place])    Metastatic breast cancer lobular carcinoma wth recurrence in the abdomen status post resection  . Cancer of left breast ([place]) 08/09/2015   T4 N1 M1 tumor, INVASIVE LOBULAR CARCINOMA.  . Carcinoma of overlapping sites of left breast in female, estrogen receptor positive ([place]) 03/22/2016  . COPD (chronic obstructive pulmonary disease) ([place])   . Diabetes ([place]) 02/24/2017  . Diabetes mellitus without complication ([place])   . Duodenal obstruction   . Encounter for nasogastric (NG) tube placement   . Gastric outlet obstruction 02/24/2017  . GERD (gastroesophageal reflux disease)   . Headache    h/o migraines as a child  . Hypertension   . Neck pain 04/19/2016  . Pancreatitis, acute 03/04/2017  . Pneumonia 2015  . Recurrent low back pain 03/15/2017  . Shortness of  breath dyspnea    with exertion    Medications:  Infusions:  . piperacillin-tazobactam    . piperacillin-tazobactam (ZOSYN)  IV    . sodium chloride     And  . sodium chloride     And  . sodium chloride    . vancomycin    . [START ON 10/12/2017] vancomycin     Assessment: 68 yof with fever, chills, tachycardia. Pharmacy consulted to dose antibiotics for sepsis.  Goal of Therapy:  Vancomycin trough 15 to 20 mcg/mL. Resolve infection Prevent ADE  Plan:  1. Zosyn 3.375 gm IV Q8H EI 2. Vancomycin 1000 mg IV x 1 followed in approximately 24 hours (stacked dosing) by vancomycin 750 mg IV Q36H, predicted trough 16 mcg/mL, pharmacy will continue to follow and adjust as needed to maintain trough 15 to 20 mcg/mL.   Vd 34.7 L, Ke 0.024 hr-1, T1/2 28.9 hr  Laural Benes, Pharm.D., BCPS Clinical Pharmacist 10/11/2017,3:32 PM

## 2017-10-11 NOTE — ED Notes (Signed)
Pt unable to provide urine sample. States she has not been able to urinate much.

## 2017-10-11 NOTE — ED Triage Notes (Signed)
Pt to ED c/o fever, chills, tachycardia.  Going to cancer center today for appointment but sent over to ED due to vital signs.

## 2017-10-11 NOTE — Progress Notes (Signed)
ANTIBIOTIC CONSULT NOTE - INITIAL  Pharmacy Consult for vancomycin and cefepime Indication: sepsis  Allergies  Allergen Reactions  . Other Other (See Comments)    Onions(that grow in the yard-pt can eat onions without problems) and dust mite Sneezing, cough, runny nose    Patient Measurements: Height: 5\' 2"  (157.5 cm) Weight: 113 lb (51.3 kg) IBW/kg (Calculated) : 50.1 Adjusted Body Weight:   Vital Signs: Temp: 100 F (37.8 C) (02/15 1713) Temp Source: Oral (02/15 1713) BP: 133/61 (02/15 1707) Pulse Rate: 129 (02/15 1708) Intake/Output from previous day: No intake/output data recorded. Intake/Output from this shift: No intake/output data recorded.  Labs: Recent Labs    10/11/17 1418 10/11/17 1522  WBC 17.4* 16.0*  HGB 10.0* 10.8*  PLT 469* 482*  CREATININE 0.38* 0.45   Estimated Creatinine Clearance: 52.5 mL/min (by C-G formula based on SCr of 0.45 mg/dL). No results for input(s): VANCOTROUGH, VANCOPEAK, VANCORANDOM, GENTTROUGH, GENTPEAK, GENTRANDOM, TOBRATROUGH, TOBRAPEAK, TOBRARND, AMIKACINPEAK, AMIKACINTROU, AMIKACIN in the last 72 hours.   Microbiology: Recent Results (from the past 720 hour(s))  Blood Culture (routine x 2)     Status: Abnormal   Collection Time: 09/21/17  3:44 AM  Result Value Ref Range Status   Specimen Description   Final    BLOOD PORTA CATH Performed at [facility], [address] [place], [place] [address]    Special Requests   Final    BOTTLES DRAWN AEROBIC AND ANAEROBIC Blood Culture results may not be optimal due to an excessive volume of blood received in culture bottles Performed at [facility], [address] [place], [place] [address]    Culture  Setup Time   Final    GRAM NEGATIVE RODS ANAEROBIC BOTTLE ONLY CRITICAL RESULT CALLED TO, READ BACK BY AND VERIFIED WITH: [place] ON 09/21/17 AT 2000 QSD Performed at [facility], [place] [address] [place], [place] [address]    Culture ESCHERICHIA  COLI (A)  Final   Report Status 09/24/2017 FINAL  Final   Organism ID, Bacteria ESCHERICHIA COLI  Final      Susceptibility   Escherichia coli - MIC*    AMPICILLIN 8 SENSITIVE Sensitive     CEFAZOLIN <=4 SENSITIVE Sensitive     CEFEPIME <=1 SENSITIVE Sensitive     CEFTAZIDIME <=1 SENSITIVE Sensitive     CEFTRIAXONE <=1 SENSITIVE Sensitive     CIPROFLOXACIN <=0.25 SENSITIVE Sensitive     GENTAMICIN <=1 SENSITIVE Sensitive     IMIPENEM <=0.25 SENSITIVE Sensitive     TRIMETH/SULFA <=20 SENSITIVE Sensitive     AMPICILLIN/SULBACTAM 4 SENSITIVE Sensitive     PIP/TAZO <=4 SENSITIVE Sensitive     Extended ESBL NEGATIVE Sensitive     * ESCHERICHIA COLI  Urine culture     Status: None   Collection Time: 09/21/17  3:44 AM  Result Value Ref Range Status   Specimen Description   Final    URINE, CLEAN CATCH Performed at [facility], [address] [place], [place] [address]    Special Requests   Final    NONE Performed at [facility], [address], [place], [place] [address]    Culture   Final    NO GROWTH Performed at [facility], [place] [address], [place],  [address]    Report Status 09/22/2017 FINAL  Final  Blood Culture ID Panel (Reflexed)     Status: Abnormal   Collection Time: 09/21/17  3:44 AM  Result Value Ref Range  Status   Enterococcus species NOT DETECTED NOT DETECTED Final   Vancomycin resistance NOT DETECTED NOT DETECTED Final   Listeria monocytogenes NOT DETECTED NOT DETECTED Final   Staphylococcus species NOT DETECTED NOT DETECTED Final   Staphylococcus aureus NOT DETECTED NOT DETECTED Final   Methicillin resistance NOT DETECTED NOT DETECTED Final   Streptococcus species NOT DETECTED NOT DETECTED Final   Streptococcus agalactiae NOT DETECTED NOT DETECTED Final   Streptococcus pneumoniae NOT DETECTED NOT DETECTED Final   Streptococcus pyogenes NOT DETECTED NOT DETECTED Final   Acinetobacter baumannii NOT DETECTED NOT DETECTED Final    Enterobacteriaceae species DETECTED (A) NOT DETECTED Final    Comment: CRITICAL RESULT CALLED TO, READ BACK BY AND VERIFIED WITH: NATE COOKSON ON 09/21/17 AT 2000 QSD Enterobacteriaceae represent a large family of gram-negative bacteria, not a single organism.    Enterobacter cloacae complex NOT DETECTED NOT DETECTED Final   Escherichia coli DETECTED (A) NOT DETECTED Final    Comment: CRITICAL RESULT CALLED TO, READ BACK BY AND VERIFIED WITH: NATE COOKSON ON 09/21/17 AT 2000 QSD    Klebsiella oxytoca NOT DETECTED NOT DETECTED Final   Klebsiella pneumoniae NOT DETECTED NOT DETECTED Final   Proteus species NOT DETECTED NOT DETECTED Final   Serratia marcescens NOT DETECTED NOT DETECTED Final   Carbapenem resistance NOT DETECTED NOT DETECTED Final   Haemophilus influenzae NOT DETECTED NOT DETECTED Final   Neisseria meningitidis NOT DETECTED NOT DETECTED Final   Pseudomonas aeruginosa NOT DETECTED NOT DETECTED Final   Candida albicans NOT DETECTED NOT DETECTED Final   Candida glabrata NOT DETECTED NOT DETECTED Final   Candida krusei NOT DETECTED NOT DETECTED Final   Candida parapsilosis NOT DETECTED NOT DETECTED Final   Candida tropicalis NOT DETECTED NOT DETECTED Final    Comment: Performed at [facility], [address] [place], [place] [address]  Blood Culture (routine x 2)     Status: None   Collection Time: 09/21/17  4:33 AM  Result Value Ref Range Status   Specimen Description BLOOD LEFT ANTECUBITAL  Final   Special Requests   Final    BOTTLES DRAWN AEROBIC AND ANAEROBIC Blood Culture adequate volume   Culture   Final    NO GROWTH 5 DAYS Performed at [facility], [address], [place], [place] [address]    Report Status 09/26/2017 FINAL  Final    Medical History: Past Medical History:  Diagnosis Date  . Abdominal distention   . AKI (acute kidney injury) ([place]) 02/24/2017  . Anemia   . Arthritis   . Asthma   . Back pain, chronic 03/15/2017  . Breast  cancer ([place]) 2009   RT MASTECTOMY, DCIS  . Cancer of intra-abdominal ([place])    Metastatic breast cancer lobular carcinoma wth recurrence in the abdomen status post resection  . Cancer of left breast ([place]) 08/09/2015   T4 N1 M1 tumor, INVASIVE LOBULAR CARCINOMA.  . Carcinoma of overlapping sites of left breast in female, estrogen receptor positive ([place]) 03/22/2016  . COPD (chronic obstructive pulmonary disease) ([place])   . Diabetes ([place]) 02/24/2017  . Diabetes mellitus without complication ([place])   . Duodenal obstruction   . Encounter for nasogastric (NG) tube placement   . Gastric outlet obstruction 02/24/2017  . GERD (gastroesophageal reflux disease)   . Headache    h/o migraines as a child  . Hypertension   . Neck pain 04/19/2016  . Pancreatitis, acute 03/04/2017  . Pneumonia 2015  . Recurrent low back pain 03/15/2017  .  Shortness of breath dyspnea    with exertion    Medications:  Infusions:  . ceFEPime (MAXIPIME) IV    . [START ON 10/12/2017] vancomycin     Assessment: 56 yof with fever, chills, tachycardia. Pharmacy consulted to dose antibiotics for sepsis.  Goal of Therapy:  Vancomycin trough 15 to 20 mcg/mL. Resolve infection Prevent ADE  Plan:  1. Cefepime 2g IV every 8 hours  2. Vancomycin 1000 mg IV x 1 followed in approximately 24 hours (stacked dosing) by vancomycin 750 mg IV Q36H, predicted trough 16 mcg/mL, pharmacy will continue to follow and adjust as needed to maintain trough 15 to 20 mcg/mL.   Vd 34.7 L, Ke 0.024 hr-1, T1/2 28.9 hr  [employer], Pharm.D Pharmacy Resident  10/11/2017,6:41 PM

## 2017-10-11 NOTE — ED Notes (Signed)
Code Sepsis. 

## 2017-10-11 NOTE — ED Notes (Signed)
Assisted patient onto bedpan to urinate. No BM.

## 2017-10-11 NOTE — H&P (Signed)
[facility] [place] NAME: Sophia Nelson    MR#:  [id number]  DATE OF BIRTH:  [birth year]  DATE OF ADMISSION:  10/11/2017  PRIMARY CARE PHYSICIAN: Donnie Coffin, MD   REQUESTING/REFERRING PHYSICIAN: Dr. Merlyn Lot  CHIEF COMPLAINT:   Chief Complaint  Patient presents with  . Code Sepsis    HISTORY OF PRESENT ILLNESS:  Sophia Nelson  is a 72 y.o. female with a known history of breast cancer currently undergoing treatment, COPD, osteoarthritis, chronic back pain, diabetes, hypertension, previous history of pancreatitis, previous history of gastric outlet obstruction who presents to the hospital from home due to weakness, malaise, lethargy and noted to have a fever of 101 here in the ER. Patient herself is a very poor historian and there is no family around in the room and therefore most of the history obtained from the chart and from the ER physician. Patient apparently is currently undergoing treatment for breast cancer and showed up to her chemotherapy session this morning and was noted to be hypotensive, febrile and therefore sent to the ER for further evaluation. A code sepsis was initiated given her high fever, leukocytosis and high relative hypotension. After getting some IV fluids in the ER patient's hypotension has now resolved. She continues to be lethargic, weak and the source of his sepsis remains unclear. Hospitalist services were contacted further treatment and evaluation.  PAST MEDICAL HISTORY:   Past Medical History:  Diagnosis Date  . Abdominal distention   . AKI (acute kidney injury) ([place]) 02/24/2017  . Anemia   . Arthritis   . Asthma   . Back pain, chronic 03/15/2017  . Breast cancer ([place]) 2009   RT MASTECTOMY, DCIS  . 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 Cancer of left breast ([place]) 08/09/2015   T4 N1 M1 tumor, INVASIVE LOBULAR  CARCINOMA.  . Carcinoma of overlapping sites of left breast in female, estrogen receptor positive ([place]) 03/22/2016  . COPD (chronic obstructive pulmonary disease) ([place])   . Diabetes ([place]) 02/24/2017  . Diabetes mellitus without complication ([place])   . Duodenal obstruction   . Encounter for nasogastric (NG) tube placement   . Gastric outlet obstruction 02/24/2017  . GERD (gastroesophageal reflux disease)   . Headache    h/o migraines as a child  . Hypertension   . Neck pain 04/19/2016  . Pancreatitis, acute 03/04/2017  . Pneumonia 2015  . Recurrent low back pain 03/15/2017  . Shortness of breath dyspnea    with exertion    PAST SURGICAL HISTORY:   Past Surgical History:  Procedure Laterality Date  . ABDOMINAL HYSTERECTOMY    . BREAST SURGERY Right 2015   mastectomy  . ESOPHAGOGASTRODUODENOSCOPY N/A 02/25/2017   Procedure: ESOPHAGOGASTRODUODENOSCOPY (EGD);  Surgeon: Jonathon Bellows, MD;  Location: [facility] ENDOSCOPY;  Service: Endoscopy;  Laterality: N/A;  . ESOPHAGOGASTRODUODENOSCOPY (EGD) WITH PROPOFOL N/A 03/06/2017   Procedure: ESOPHAGOGASTRODUODENOSCOPY (EGD) WITH PROPOFOL;  Surgeon: [name], MD;  Location: [facility] ENDOSCOPY;  Service: Endoscopy;  Laterality: N/A;  . GASTROJEJUNOSTOMY N/A 03/08/2017   Procedure: Roux-en-Y gastrojejunostomy Bypass of Gastric Outlet Obstruction, small bowel resection;  Surgeon: Vickie Epley, MD;  Location: [facility] ORS;  Service: General;  Laterality: N/A;  . PORTACATH PLACEMENT Right 08/01/2017   Procedure: INSERTION PORT-A-CATH;  Surgeon: Christene Lye, MD;  Location: [facility] ORS;  Service: General;  Laterality: Right;  . SIMPLE MASTECTOMY WITH  AXILLARY SENTINEL NODE BIOPSY Left 02/29/2016   Procedure: SIMPLE MASTECTOMY;  Surgeon: Christene Lye, MD;  Location: [facility] ORS;  Service: General;  Laterality: Left;  . TUBAL LIGATION      SOCIAL HISTORY:   Social History   Tobacco Use  . Smoking status: Former Smoker    Packs/day: 0.25    Years:  15.00    Pack years: 3.75    Types: Cigarettes    Last attempt to quit: 07/23/2017    Years since quitting: 0.2  . Smokeless tobacco: Never Used  . Tobacco comment: currently as of 02-21-16 pt states she is only smoking 2-3 cigarettes per day  Substance Use Topics  . 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 Lung cancer Father   . Cancer Maternal Aunt   . Dementia Mother   . Breast cancer Neg Hx     DRUG ALLERGIES:   Allergies  Allergen Reactions  . Other Other (See Comments)    Onions(that grow in the yard-pt can eat onions without problems) and dust mite Sneezing, cough, runny nose    REVIEW OF SYSTEMS:   Review of Systems  Unable to perform ROS: Mental acuity    MEDICATIONS AT HOME:   Prior to Admission medications   Medication Sig Start Date End Date Taking? Authorizing Provider  acetaminophen (TYLENOL) 325 MG tablet Take 1 tablet (325 mg total) by mouth every 6 (six) hours as needed for mild pain (or Fever >/= 101). Patient not taking: Reported on 09/20/2017 06/17/17   Nicholes Mango, MD  ADVAIR DISKUS 500-50 MCG/DOSE AEPB Inhale 1 puff into the lungs 2 (two) times daily.  05/04/15   [provider]  albuterol (PROVENTIL HFA;VENTOLIN HFA) 108 (90 Base) MCG/ACT inhaler Inhale 2 puffs into the lungs every 6 (six) hours as needed for wheezing or shortness of breath.    [provider]  aspirin 81 MG tablet Take 81 mg by mouth daily.    [provider]  atorvastatin (LIPITOR) 10 MG tablet Take 10 mg by mouth daily.    [provider]  docusate sodium (COLACE) 100 MG capsule Take 100 mg by mouth 2 (two) times daily.    [provider]  ergocalciferol (VITAMIN D2) 50000 units capsule Take 1 capsule (50,000 Units total) by mouth once a week. Patient  not taking: Reported on 10/04/2017 05/30/17   [name], MD  feeding supplement, ENSURE ENLIVE, (ENSURE ENLIVE) LIQD Take 237 mLs by mouth 3 (three) times daily between meals. 03/13/17   Fritzi Mandes, MD  fluticasone (FLONASE) 50 MCG/ACT nasal spray Place 2 sprays into both nostrils daily.    [provider]  gabapentin (NEURONTIN) 300 MG capsule Take 300 mg by mouth 2 (two) times daily.    [provider]  guaiFENesin (MUCINEX) 600 MG 12 hr tablet Take 1 tablet (600 mg total) by mouth 2 (two) times daily as needed for cough or to loosen phlegm. 06/17/17   Nicholes Mango, MD  lidocaine-prilocaine (EMLA) cream Apply 1 application topically as needed (for port access).  04/26/17   [provider]  lisinopril (PRINIVIL,ZESTRIL) 40 MG tablet Take 40 mg by mouth daily.    [provider]  loperamide (IMODIUM) 2 MG capsule One pill after  each loose stool; maximum upto 8 pills a day. Patient taking differently: Take 2 mg by mouth as needed for diarrhea or loose stools (Up to 8 pills (16 mg) a day).  05/02/17   [name], MD  loratadine (CLARITIN) 10 MG tablet Take 10 mg by mouth daily.    [provider]  Magnesium Cl-Calcium Carbonate (SLOW MAGNESIUM/CALCIUM) 70-117 MG TBEC Take 1 tablet by mouth 2 (two) times daily. 06/28/17   [name], MD  magnesium hydroxide (MILK OF MAGNESIA) 400 MG/5ML suspension Take 30-45 mLs by mouth daily as needed for mild constipation.    [provider]  metoCLOPramide (REGLAN) 10 MG tablet Take 1 tablet (10 mg total) by mouth 4 (four) times daily. 10/08/17   [name], MD  montelukast (SINGULAIR) 10 MG tablet Take 10 mg by mouth at bedtime.    [provider]  morphine (MS CONTIN) 30 MG 12 hr tablet Take 1 tablet (30 mg total) by mouth every 12 (twelve) hours. 10/04/17   [name], MD  omeprazole (PRILOSEC) 20 MG capsule Take 20 mg by mouth every morning.  06/23/15    [provider]  ondansetron (ZOFRAN ODT) 8 MG disintegrating tablet Take 1 tablet (8 mg total) by mouth every 8 (eight) hours as needed for nausea or vomiting. 07/26/17   [name], MD  oxyCODONE-acetaminophen (PERCOCET/ROXICET) 5-325 MG tablet Take 1 tablet by mouth every 8 (eight) hours as needed for severe pain. 10/04/17   [name], MD  potassium chloride SA (K-DUR,KLOR-CON) 20 MEQ tablet 1 pill twice a day 10/08/17   [name], MD  prochlorperazine (COMPAZINE) 10 MG tablet Take 1 tablet (10 mg total) by mouth every 6 (six) hours as needed for nausea or vomiting. 07/26/17   [name], MD  senna-docusate (SENOKOT-S) 8.6-50 MG tablet Take 1 tablet by mouth at bedtime as needed for mild constipation. 06/17/17   Gouru, Illene Silver, MD  SPIRIVA HANDIHALER 18 MCG inhalation capsule Place 18 mcg into inhaler and inhale every morning.  05/04/15   [provider]      VITAL SIGNS:  Blood pressure 133/61, pulse (!) 129, temperature 100 F (37.8 C), temperature source Oral, resp. rate (!) 22, height 5\' 2"  (1.575 m), weight 51.3 kg (113 lb), SpO2 100 %.  PHYSICAL EXAMINATION:  Physical Exam  GENERAL:  72 y.o.-year-old patient lying in the bed groaning but in NAD.   EYES: Pupils equal, round, reactive to light and accommodation. No scleral icterus. Extraocular muscles intact.  HEENT: Head atraumatic, normocephalic. Oropharynx and nasopharynx clear. No oropharyngeal erythema, moist oral mucosa  NECK:  Supple, no jugular venous distention. No thyroid enlargement, no tenderness.  LUNGS: Normal breath sounds bilaterally, no wheezing, rales, rhonchi. No use of accessory muscles of respiration.  CARDIOVASCULAR: S1, S2 RRR. No murmurs, rubs, gallops, clicks.  ABDOMEN: Soft, nontender, nondistended. Bowel sounds present. No organomegaly or mass.  EXTREMITIES: No pedal edema, cyanosis, or clubbing. + 2 pedal & radial pulses b/l.   NEUROLOGIC: Cranial  nerves II through XII are intact. No focal Motor or sensory deficits appreciated b/l PSYCHIATRIC: The patient is alert and oriented x 3. SKIN: No obvious rash, lesion, or ulcer.   LABORATORY PANEL:   CBC Recent Labs  Lab 10/11/17 1522  WBC 16.0*  HGB 10.8*  HCT 32.8*  PLT 482*   ------------------------------------------------------------------------------------------------------------------  Chemistries  Recent Labs  Lab 10/11/17 1522  NA 140  K 2.9*  CL 98*  CO2 29  GLUCOSE 116*  BUN <5*  CREATININE 0.45  CALCIUM 9.1  AST 224*  ALT 44  ALKPHOS 390*  BILITOT 1.9*   ------------------------------------------------------------------------------------------------------------------  Cardiac Enzymes Recent Labs  Lab 10/11/17 1418  TROPONINI 0.14*   ------------------------------------------------------------------------------------------------------------------  RADIOLOGY:  Ct Abdomen Pelvis W Contrast  Result Date: 10/11/2017 CLINICAL DATA:  Metastatic breast cancer with abdominal and small bowel involvement. Status post Roux-en-Y for bowel obstruction EXAM: CT ABDOMEN AND PELVIS WITH CONTRAST TECHNIQUE: Multidetector CT imaging of the abdomen and pelvis was performed using the standard protocol following bolus administration of intravenous contrast. CONTRAST:  56mL ISOVUE-300 IOPAMIDOL (ISOVUE-300) INJECTION 61% COMPARISON:  09/22/2017 FINDINGS: Lower chest: Unremarkable. Hepatobiliary: No focal abnormality in the liver parenchyma. There is no evidence for gallstones, gallbladder wall thickening, or pericholecystic fluid. No intrahepatic or extrahepatic biliary dilation. Pancreas: No focal mass lesion. No dilatation of the main duct. No intraparenchymal cyst. No peripancreatic edema. Spleen: No splenomegaly. No focal mass lesion. Adrenals/Urinary Tract: No adrenal nodule or mass. Similar appearance mild right hydronephrosis mild fullness left intrarenal collecting system  is stable. Circumferential bladder wall thickening is slightly more prominent than before. Stomach/Bowel: Status post gastric bypass. There is edema in the retroperitoneal tissues around the transverse duodenum and duodenal bulb appears distended. These features are similar to prior study. No overt small bowel obstruction. Terminal ileum shows hyperenhancement. Focus of hyperenhancement identified in small bowel anterior left abdomen (image 46 series 2). As on the prior study, there is wall thickening and hyperenhancement in the ascending colon. Colon is diffusely decompressed. Vascular/Lymphatic: There is abdominal aortic atherosclerosis without aneurysm. There is no gastrohepatic or hepatoduodenal ligament lymphadenopathy. No intraperitoneal or retroperitoneal lymphadenopathy. No pelvic sidewall lymphadenopathy. Reproductive: Uterus surgically absent. Other: Ill-defined heterogeneously enhancing lesion is identified in the right posterior pelvis (image 50 series 2) measuring 3.8 x 4.4 cm. The ureter can be seen passing posterior to this suggesting that it is along the posterior peritoneal surface. Metastatic lesions suspected. Hematoma or abscess considered less likely but not excluded. Musculoskeletal: Bone windows reveal no worrisome lytic or sclerotic osseous lesions. IMPRESSION: 1. Interval development 3.8 x 4.4 cm heterogeneously enhancing lesion posterior right pelvis suspicious for metastatic involvement. This does have some low attenuation and hematoma or abscess would be considered less likely possibilities. Close follow-up recommended. 2. As on prior study, there are multiple areas of abnormal enhancement in the bowel of the left abdomen. Abnormal wall thickening and enhancement is seen in the terminal ileum and right colon as well. Imaging features suggest metastatic involvement. 3. Mild duodenal distention with edema in the retroperitoneal tissues around the duodenum. 4. Small volume intraperitoneal  free fluid 5. Similar appearance of right greater than left hydronephrosis. Electronically Signed   By: Misty Stanley M.D.   On: 10/11/2017 17:28   Dg Chest Port 1 View  Result Date: 10/11/2017 CLINICAL DATA:  Shortness of breath with fever and chills. EXAM: PORTABLE CHEST 1 VIEW COMPARISON:  09/21/2017 FINDINGS: 1535 hours. Lungs are hyperexpanded. The lungs are clear without focal pneumonia, edema, pneumothorax or pleural effusion. The cardiopericardial silhouette is within normal limits for size. Right Port-A-Cath is stable. The visualized bony structures of the thorax are intact. IMPRESSION: Stable.  No acute findings. Electronically Signed   By: Misty Stanley M.D.   On: 10/11/2017 15:56     IMPRESSION AND PLAN:   72 year old female with past medical history of breast cancer currently undergoing chemotherapy, hypertension, diabetes, COPD, previous history of pancreatitis, chronic back pain, previous  history of gastric outlet obstruction who presents to the hospital due to fever, hypotension weakness lethargy and malaise.  1. Sepsis-patient meets criteria given her leukocytosis, high fever, tachycardia. The source of fever remains unclear. -Patient's CT abdomen pelvis shows no acute pathology other than metastatic disease from her cancer. Chest x-ray is negative for pneumonia, patient's flu PCR is negative, height urinalysis is negative. -We'll continue broad-spectrum IV antibiotics with vancomycin, cefepime. Follow cultures follow hemodynamics.  2. Altered mental status-metabolic encephalopathy secondary to the underlying sepsis. -Continue treatment as mentioned above the underlying sepsis, follow mental status.  3. Hyperlipidemia-continue atorvastatin.  4. Neuropathy-continue gabapentin.  5. History of breast cancer currently undergoing chemotherapy.-Patient is followed up by Dr. Rogue Bussing.  - if needed consider Oncology consult.   6. COPD-no acute exacerbation-continue albuterol  inhaler, Dulera.  All the records are reviewed and case discussed with ED provider. Management plans discussed with the patient, family and they are in agreement.  CODE STATUS: Full code  TOTAL TIME TAKING CARE OF THIS PATIENT: 40 minutes.    Henreitta Leber M.D on 10/11/2017 at 6:28 PM  Between 7am to 6pm - Pager - [phone]  After 6pm go to [email or web address] - password [email or web address] [facility]  [facility]  Office  [phone]  CC: Primary care physician; Donnie Coffin, MD

## 2017-10-12 ENCOUNTER — Inpatient Hospital Stay: Payer: Medicare HMO

## 2017-10-12 DIAGNOSIS — A409 Streptococcal sepsis, unspecified: Principal | ICD-10-CM

## 2017-10-12 DIAGNOSIS — R6521 Severe sepsis with septic shock: Secondary | ICD-10-CM

## 2017-10-12 DIAGNOSIS — D649 Anemia, unspecified: Secondary | ICD-10-CM

## 2017-10-12 DIAGNOSIS — G9341 Metabolic encephalopathy: Secondary | ICD-10-CM

## 2017-10-12 DIAGNOSIS — R7881 Bacteremia: Secondary | ICD-10-CM

## 2017-10-12 DIAGNOSIS — J9610 Chronic respiratory failure, unspecified whether with hypoxia or hypercapnia: Secondary | ICD-10-CM

## 2017-10-12 DIAGNOSIS — B955 Unspecified streptococcus as the cause of diseases classified elsewhere: Secondary | ICD-10-CM

## 2017-10-12 DIAGNOSIS — I248 Other forms of acute ischemic heart disease: Secondary | ICD-10-CM

## 2017-10-12 DIAGNOSIS — C7951 Secondary malignant neoplasm of bone: Secondary | ICD-10-CM

## 2017-10-12 DIAGNOSIS — E876 Hypokalemia: Secondary | ICD-10-CM

## 2017-10-12 LAB — BLOOD CULTURE ID PANEL (REFLEXED)
Acinetobacter baumannii: NOT DETECTED
Candida albicans: NOT DETECTED
Candida glabrata: NOT DETECTED
Candida krusei: NOT DETECTED
Candida parapsilosis: NOT DETECTED
Candida tropicalis: NOT DETECTED
ENTEROBACTERIACEAE SPECIES: NOT DETECTED
ENTEROCOCCUS SPECIES: NOT DETECTED
Enterobacter cloacae complex: NOT DETECTED
Escherichia coli: NOT DETECTED
Haemophilus influenzae: NOT DETECTED
Klebsiella oxytoca: NOT DETECTED
Klebsiella pneumoniae: NOT DETECTED
LISTERIA MONOCYTOGENES: NOT DETECTED
NEISSERIA MENINGITIDIS: NOT DETECTED
PSEUDOMONAS AERUGINOSA: NOT DETECTED
Proteus species: NOT DETECTED
SERRATIA MARCESCENS: NOT DETECTED
STAPHYLOCOCCUS AUREUS BCID: NOT DETECTED
STREPTOCOCCUS AGALACTIAE: NOT DETECTED
STREPTOCOCCUS PNEUMONIAE: NOT DETECTED
STREPTOCOCCUS PYOGENES: NOT DETECTED
STREPTOCOCCUS SPECIES: DETECTED — AB
Staphylococcus species: NOT DETECTED

## 2017-10-12 LAB — BASIC METABOLIC PANEL
Anion gap: 12 (ref 5–15)
BUN: 6 mg/dL (ref 6–20)
CALCIUM: 7.8 mg/dL — AB (ref 8.9–10.3)
CO2: 24 mmol/L (ref 22–32)
CREATININE: 0.5 mg/dL (ref 0.44–1.00)
Chloride: 102 mmol/L (ref 101–111)
GLUCOSE: 77 mg/dL (ref 65–99)
Potassium: 2.9 mmol/L — ABNORMAL LOW (ref 3.5–5.1)
Sodium: 138 mmol/L (ref 135–145)

## 2017-10-12 LAB — BASIC METABOLIC PANEL WITH GFR
Anion gap: 5 (ref 5–15)
BUN: 8 mg/dL (ref 6–20)
CO2: 26 mmol/L (ref 22–32)
Calcium: 7.6 mg/dL — ABNORMAL LOW (ref 8.9–10.3)
Chloride: 108 mmol/L (ref 101–111)
Creatinine, Ser: 0.37 mg/dL — ABNORMAL LOW (ref 0.44–1.00)
GFR calc Af Amer: >60 mL/min (ref >60)
GFR calc non Af Amer: >60 mL/min (ref >60)
Glucose, Bld: 94 mg/dL (ref 65–99)
Potassium: 3 mmol/L — ABNORMAL LOW (ref 3.5–5.1)
Sodium: 139 mmol/L (ref 135–145)

## 2017-10-12 LAB — TROPONIN I
TROPONIN I: 0.37 ng/mL — AB (ref <0.03)
Troponin I: 0.44 ng/mL (ref <0.03)
Troponin I: 0.37 ng/mL (ref <0.03)

## 2017-10-12 LAB — GLUCOSE, CAPILLARY: GLUCOSE-CAPILLARY: 84 mg/dL (ref 65–99)

## 2017-10-12 LAB — LACTIC ACID, PLASMA: LACTIC ACID, VENOUS: 0.6 mmol/L (ref 0.5–1.9)

## 2017-10-12 LAB — CK: Total CK: 27 U/L — ABNORMAL LOW (ref 38–234)

## 2017-10-12 LAB — CBC
HCT: 26.2 % — ABNORMAL LOW (ref 35.0–47.0)
Hemoglobin: 8.4 g/dL — ABNORMAL LOW (ref 12.0–16.0)
MCH: 27.5 pg (ref 26.0–34.0)
MCHC: 32.1 g/dL (ref 32.0–36.0)
MCV: 85.6 fL (ref 80.0–100.0)
PLATELETS: 380 10*3/uL (ref 150–440)
RBC: 3.06 MIL/uL — ABNORMAL LOW (ref 3.80–5.20)
RDW: 16.6 % — ABNORMAL HIGH (ref 11.5–14.5)
WBC: 23.1 10*3/uL — ABNORMAL HIGH (ref 3.6–11.0)

## 2017-10-12 LAB — MRSA PCR SCREENING: MRSA by PCR: NEGATIVE

## 2017-10-12 LAB — PHOSPHORUS: Phosphorus: 2.2 mg/dL — ABNORMAL LOW (ref 2.5–4.6)

## 2017-10-12 LAB — MAGNESIUM: Magnesium: 0.9 mg/dL — CL (ref 1.7–2.4)

## 2017-10-12 MED ORDER — SODIUM CHLORIDE 0.9 % IV SOLN
INTRAVENOUS | Status: AC
  Administered 2017-10-12 (×2): via INTRAVENOUS

## 2017-10-12 MED ORDER — BENZONATATE 100 MG PO CAPS
100.0000 mg | ORAL_CAPSULE | Freq: Three times a day (TID) | ORAL | Status: DC
  Administered 2017-10-12 – 2017-10-17 (×15): 100 mg via ORAL
  Filled 2017-10-12 (×16): qty 1

## 2017-10-12 MED ORDER — SODIUM CHLORIDE 0.9 % IV BOLUS (SEPSIS)
1000.0000 mL | Freq: Once | INTRAVENOUS | Status: AC
  Administered 2017-10-12: 09:00:00 1000 mL via INTRAVENOUS

## 2017-10-12 MED ORDER — POTASSIUM CHLORIDE CRYS ER 20 MEQ PO TBCR
40.0000 meq | EXTENDED_RELEASE_TABLET | ORAL | Status: DC
  Filled 2017-10-12: qty 2

## 2017-10-12 MED ORDER — SODIUM CHLORIDE 0.9 % IV BOLUS (SEPSIS)
500.0000 mL | Freq: Once | INTRAVENOUS | Status: AC
  Administered 2017-10-12: 500 mL via INTRAVENOUS

## 2017-10-12 MED ORDER — GUAIFENESIN-CODEINE 100-10 MG/5ML PO SOLN
10.0000 mL | ORAL | Status: DC | PRN
Start: 2017-10-12 — End: 2017-10-17

## 2017-10-12 MED ORDER — POTASSIUM CHLORIDE 10 MEQ/100ML IV SOLN
10.0000 meq | INTRAVENOUS | Status: AC
  Administered 2017-10-12 (×6): 10 meq via INTRAVENOUS
  Filled 2017-10-12 (×7): qty 100

## 2017-10-12 NOTE — Progress Notes (Signed)
Troponin came back at .44 denies chest pain. Dr. Darvin Neighbours notified. No orders received.

## 2017-10-12 NOTE — Progress Notes (Signed)
PHARMACY - PHYSICIAN COMMUNICATION CRITICAL VALUE ALERT - BLOOD CULTURE IDENTIFICATION (BCID)  Sophia Nelson is an 72 y.o. female who presented to [facility] on 10/11/2017 with a chief complaint of weakness, malaise and h/o breast cancer undergoing chemo  Assessment:  Febrile, tachypneic, tachycardic, hypotensive, WBC 16 (include suspected source if known)  Name of physician (or Provider) Contacted: Montell Salary  Current antibiotics: Vanc/cefepime  Changes to prescribed antibiotics recommended:  Patient is on recommended antibiotics - No changes needed  Results for orders placed or performed during the hospital encounter of 10/11/17  Blood Culture ID Panel (Reflexed) (Collected: 10/11/2017  3:23 PM)  Result Value Ref Range   Enterococcus species NOT DETECTED NOT DETECTED   Listeria monocytogenes NOT DETECTED NOT DETECTED   Staphylococcus species NOT DETECTED NOT DETECTED   Staphylococcus aureus NOT DETECTED NOT DETECTED   Streptococcus species DETECTED (A) NOT DETECTED   Streptococcus agalactiae NOT DETECTED NOT DETECTED   Streptococcus pneumoniae NOT DETECTED NOT DETECTED   Streptococcus pyogenes NOT DETECTED NOT DETECTED   Acinetobacter baumannii NOT DETECTED NOT DETECTED   Enterobacteriaceae species NOT DETECTED NOT DETECTED   Enterobacter cloacae complex NOT DETECTED NOT DETECTED   Escherichia coli NOT DETECTED NOT DETECTED   Klebsiella oxytoca NOT DETECTED NOT DETECTED   Klebsiella pneumoniae NOT DETECTED NOT DETECTED   Proteus species NOT DETECTED NOT DETECTED   Serratia marcescens NOT DETECTED NOT DETECTED   Haemophilus influenzae NOT DETECTED NOT DETECTED   Neisseria meningitidis NOT DETECTED NOT DETECTED   Pseudomonas aeruginosa NOT DETECTED NOT DETECTED   Candida albicans NOT DETECTED NOT DETECTED   Candida glabrata NOT DETECTED NOT DETECTED   Candida krusei NOT DETECTED NOT DETECTED   Candida parapsilosis NOT DETECTED NOT DETECTED   Candida tropicalis NOT  DETECTED NOT DETECTED   Tobie Lords, PharmD, BCPS Clinical Pharmacist 10/12/2017

## 2017-10-12 NOTE — Consult Note (Signed)
[facility] Consultation      Name: Sophia Nelson MRN: [id number] DOB: 01-01-1946    ADMISSION DATE:  10/11/2017 CONSULTATION DATE:  08/11/2018  REFERRING MD :  Dr. Darvin Neighbours   CHIEF COMPLAINT:     Shortness of breath   HISTORY OF PRESENT ILLNESS   The patient with past medical history of metastatic breast cancer, COPD and diabetes resents to the emergency department with shortness of breath.  The patient states that she has had a nonproductive cough and all of a sudden was unable to breathe this evening.  She reports that she gets pneumonia every year and that she thinks this may be similar.  She is chronically on 4 L of oxygen at home.  She received multiple breathing treatments in the emergency department to improve her respiratory status after which the emergency department staff called the hospitalist service for other management.  She was admitted with a diagnosis of Sepsis and probable Pneumonia Her blood cultures were positive for E. Coli.   SIGNIFICANT EVENTS   This morning the patient became hypotensive which was refractory to volume resuscitation.   She was also noted to have decrease mental status.  She received volume bolus then was sent for CT of the head.  She was admitted to ICU for closer monitoring and management.    PAST MEDICAL HISTORY    :  Past Medical History:  Diagnosis Date  . Abdominal distention   . AKI (acute kidney injury) ([place]) 02/24/2017  . Anemia   . Arthritis   . Asthma   . Back pain, chronic 03/15/2017  . Breast cancer ([place]) 2009   RT MASTECTOMY, DCIS  . 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 Cancer of left breast ([place]) 08/09/2015   T4 N1 M1 tumor, INVASIVE LOBULAR CARCINOMA.  . Carcinoma of overlapping sites of left breast in female, estrogen receptor positive ([place]) 03/22/2016  . COPD (chronic obstructive pulmonary disease)  ([place])   . Diabetes ([place]) 02/24/2017  . Diabetes mellitus without complication ([place])   . Duodenal obstruction   . Encounter for nasogastric (NG) tube placement   . Gastric outlet obstruction 02/24/2017  . GERD (gastroesophageal reflux disease)   . Headache    h/o migraines as a child  . Hypertension   . Neck pain 04/19/2016  . Pancreatitis, acute 03/04/2017  . Pneumonia 2015  . Recurrent low back pain 03/15/2017  . Shortness of breath dyspnea    with exertion   Past Surgical History:  Procedure Laterality Date  . ABDOMINAL HYSTERECTOMY    . BREAST SURGERY Right 2015   mastectomy  . ESOPHAGOGASTRODUODENOSCOPY N/A 02/25/2017   Procedure: ESOPHAGOGASTRODUODENOSCOPY (EGD);  Surgeon: Jonathon Bellows, MD;  Location: [facility] ENDOSCOPY;  Service: Endoscopy;  Laterality: N/A;  . ESOPHAGOGASTRODUODENOSCOPY (EGD) WITH PROPOFOL N/A 03/06/2017   Procedure: ESOPHAGOGASTRODUODENOSCOPY (EGD) WITH PROPOFOL;  Surgeon: [name], MD;  Location: [facility] ENDOSCOPY;  Service: Endoscopy;  Laterality: N/A;  . GASTROJEJUNOSTOMY N/A 03/08/2017   Procedure: Roux-en-Y gastrojejunostomy Bypass of Gastric Outlet Obstruction, small bowel resection;  Surgeon: Vickie Epley, MD;  Location: [facility] ORS;  Service: General;  Laterality: N/A;  . PORTACATH PLACEMENT Right 08/01/2017   Procedure: INSERTION PORT-A-CATH;  Surgeon: Christene Lye, MD;  Location: [facility] ORS;  Service: General;  Laterality: Right;  . SIMPLE MASTECTOMY WITH AXILLARY SENTINEL NODE BIOPSY Left 02/29/2016   Procedure: SIMPLE MASTECTOMY;  Surgeon: Christene Lye,  MD;  Location: [facility] ORS;  Service: General;  Laterality: Left;  . TUBAL LIGATION     Prior to Admission medications   Medication Sig Start Date End Date Taking? Authorizing Provider  ADVAIR DISKUS 500-50 MCG/DOSE AEPB Inhale 1 puff into the lungs 2 (two) times daily.  05/04/15  Yes [provider]  albuterol (PROVENTIL HFA;VENTOLIN HFA) 108 (90 Base) MCG/ACT inhaler Inhale 2 puffs into  the lungs every 6 (six) hours as needed for wheezing or shortness of breath.   Yes [provider]  aspirin EC 81 MG tablet Take 81 mg by mouth daily.   Yes [provider]  atorvastatin (LIPITOR) 10 MG tablet Take 10 mg by mouth every evening.    Yes [provider]  docusate sodium (COLACE) 100 MG capsule Take 100 mg by mouth 2 (two) times daily.   Yes [provider]  feeding supplement, ENSURE ENLIVE, (ENSURE ENLIVE) LIQD Take 237 mLs by mouth 3 (three) times daily between meals. 03/13/17  Yes Fritzi Mandes, MD  fluticasone (FLONASE) 50 MCG/ACT nasal spray Place 2 sprays into both nostrils daily.   Yes [provider]  gabapentin (NEURONTIN) 300 MG capsule Take 300 mg by mouth 2 (two) times daily.   Yes [provider]  lidocaine-prilocaine (EMLA) cream Apply 1 application topically as needed (for port access).  04/26/17  Yes [provider]  loperamide (IMODIUM) 2 MG capsule One pill after each loose stool; maximum upto 8 pills a day. Patient taking differently: Take 2 mg by mouth as needed for diarrhea or loose stools (Up to 8 pills (16 mg) a day).  05/02/17  Yes [name], MD  loratadine (CLARITIN) 10 MG tablet Take 10 mg by mouth daily.   Yes [provider]  Magnesium Cl-Calcium Carbonate (SLOW MAGNESIUM/CALCIUM) 70-117 MG TBEC Take 1 tablet by mouth 2 (two) times daily. 06/28/17  Yes [name], MD  metoCLOPramide (REGLAN) 10 MG tablet Take 1 tablet (10 mg total) by mouth 4 (four) times daily. 10/08/17  Yes [name], MD  montelukast (SINGULAIR) 10 MG tablet Take 10 mg by mouth at bedtime.   Yes [provider]  morphine (MS CONTIN) 30 MG 12 hr tablet Take 1 tablet (30 mg total) by mouth every 12 (twelve) hours. 10/04/17  Yes [name], MD  omeprazole (PRILOSEC) 20 MG capsule Take 20 mg by mouth every morning.  06/23/15  Yes [provider]  ondansetron (ZOFRAN ODT)  8 MG disintegrating tablet Take 1 tablet (8 mg total) by mouth every 8 (eight) hours as needed for nausea or vomiting. 07/26/17  Yes [name], MD  oxyCODONE-acetaminophen (PERCOCET/ROXICET) 5-325 MG tablet Take 1 tablet by mouth every 8 (eight) hours as needed for severe pain. 10/04/17  Yes [name], MD  potassium chloride SA (K-DUR,KLOR-CON) 20 MEQ tablet 1 pill twice a day Patient taking differently: Take 20 mEq by mouth daily.  10/08/17  Yes [name], MD  prochlorperazine (COMPAZINE) 10 MG tablet Take 1 tablet (10 mg total) by mouth every 6 (six) hours as needed for nausea or vomiting. 07/26/17  Yes [name], MD  senna-docusate (SENOKOT-S) 8.6-50 MG tablet Take 1 tablet by mouth at bedtime as needed for mild constipation. 06/17/17  Yes Nicholes Mango, MD   Allergies  Allergen Reactions  . Other Other (See Comments)    Onions(that grow in the yard-pt can eat onions without problems) and dust mite Sneezing, cough, runny nose  FAMILY HISTORY   Family History  Problem Relation Age of Onset  . Lung cancer Father   . Cancer Maternal Aunt   . Dementia Mother   . Breast cancer Neg Hx       SOCIAL HISTORY    reports that she quit smoking about 2 months ago. Her smoking use included cigarettes. She has a 3.75 pack-year smoking history. she has never used smokeless tobacco. She reports that she does not drink alcohol or use drugs.  ROS Constitutional: Negative for chills and fever.  HENT: Negative for sore throat and tinnitus.   Eyes: Negative for blurred vision and redness.  Respiratory: Positive for cough and shortness of breath.   Cardiovascular: Negative for chest pain, palpitations, orthopnea and PND.  Gastrointestinal: Negative for abdominal pain, diarrhea, nausea and vomiting.  Genitourinary: Negative for dysuria, frequency and urgency.  Musculoskeletal: Negative for joint pain and myalgias.  Skin: Negative for rash.       No  lesions  Neurological: Negative for speech change, focal weakness and weakness.  Endo/Heme/Allergies: Does not bruise/bleed easily.       No temperature intolerance  Psychiatric/Behavioral: Negative for depression and suicidal ideas.       VITAL SIGNS    Temp:  [98 F (36.7 C)-102.8 F (39.3 C)] 98.1 F (36.7 C) (02/16 1230) Pulse Rate:  [85-133] 102 (02/16 1400) Resp:  [16-27] 23 (02/16 1400) BP: (71-168)/(30-74) 100/60 (02/16 1400) SpO2:  [97 %-100 %] 100 % (02/16 1400) Weight:  [112 lb 10.5 oz (51.1 kg)-115 lb 15.4 oz (52.6 kg)] 112 lb 10.5 oz (51.1 kg) (02/16 1230) HEMODYNAMICS:  Blood pressure better post IVF bolus    INTAKE / OUTPUT:  Intake/Output Summary (Last 24 hours) at 10/12/2017 1519 Last data filed at 10/12/2017 1407 Gross per 24 hour  Intake 2016.66 ml  Output -  Net 2016.66 ml       PHYSICAL EXAM    GENERAL:  Patient lying in the bed, no acute distress EYES: Pupils equal, round, reactive to light and accommodation.  HEENT: Head atraumatic, normocephalic. Oropharynx and nasopharynx clear.  NECK:  Supple, no jugular venous distention. No thyroid enlargement, no tenderness.  LUNGS: Bibasilar crackles CARDIOVASCULAR: S1, S2.  Tachycardic ABDOMEN: Soft, nontender, nondistended. Bowel sounds present. No organomegaly or mass.  EXTREMITIES: No cyanosis, clubbing or edema b/l.    NEUROLOGIC: Cranial nerves II through XII are intact. No focal Motor or sensory deficits b/l.   PSYCHIATRIC: The patient is awake and in good spirits      LABS   LABS:  CBC Recent Labs  Lab 10/11/17 1418 10/11/17 1522 10/12/17 0633  WBC 17.4* 16.0* 23.1*  HGB 10.0* 10.8* 8.4*  HCT 30.0* 32.8* 26.2*  PLT 469* 482* 380   Coag's Recent Labs  Lab 10/11/17 1522  INR 0.96   BMET Recent Labs  Lab 10/11/17 1418 10/11/17 1522 10/12/17 0633  NA 135 140 138  K 3.2* 2.9* 2.9*  CL 96* 98* 102  CO2 28 29 24   BUN <5* <5* 6  CREATININE 0.38* 0.45 0.50  GLUCOSE  134* 116* 77   Electrolytes Recent Labs  Lab 10/11/17 1418 10/11/17 1522 10/12/17 0633  CALCIUM 8.9 9.1 7.8*   Sepsis Markers Recent Labs  Lab 10/11/17 1418 10/11/17 1522 10/11/17 1828  LATICACIDVEN  --  1.5 1.3  PROCALCITON 1.45  --   --    ABG No results for input(s): PHART, PCO2ART, PO2ART in the last 168 hours. Liver Enzymes Recent Labs  Lab  10/11/17 1522  AST 224*  ALT 44  ALKPHOS 390*  BILITOT 1.9*  ALBUMIN 2.7*   Cardiac Enzymes Recent Labs  Lab 10/11/17 1828 10/12/17 0003 10/12/17 0633  TROPONINI 0.30* 0.37* 0.44*   Glucose Recent Labs  Lab 10/12/17 1139  GLUCAP 84     Recent Results (from the past 240 hour(s))  Culture, blood (Routine x 2)     Status: None (Preliminary result)   Collection Time: 10/11/17  3:23 PM  Result Value Ref Range Status   Specimen Description BLOOD BLOOD LEFT FOREARM  Final   Special Requests   Final    BOTTLES DRAWN AEROBIC AND ANAEROBIC Blood Culture adequate volume   Culture  Setup Time   Final    GRAM POSITIVE COCCI IN BOTH AEROBIC AND ANAEROBIC BOTTLES CRITICAL RESULT CALLED TO, READ BACK BY AND VERIFIED WITH: DAVID BESANTI AT 1610 10/12/17.PMH Performed at [facility], [address], [place], [place] [address]    Culture [facility] POSITIVE COCCI  Final   Report Status PENDING  Incomplete  Culture, blood (Routine x 2)     Status: None (Preliminary result)   Collection Time: 10/11/17  3:23 PM  Result Value Ref Range Status   Specimen Description   Final    BLOOD PORTA CATH Performed at [facility], [address], [place], [place] [address]    Special Requests   Final    BOTTLES DRAWN AEROBIC AND ANAEROBIC Blood Culture results may not be optimal due to an excessive volume of blood received in culture bottles Performed at [facility], [address] [place], [place] [address]    Culture  Setup Time   Final    GRAM POSITIVE COCCI IN BOTH AEROBIC AND ANAEROBIC BOTTLES CRITICAL  RESULT CALLED TO, READ BACK BY AND VERIFIED WITH: DAVID BESANTI AT 8295 10/12/17.PMH Performed at [facility], [place] [address], [place], [place] [address]    Culture GRAM POSITIVE COCCI  Final   Report Status PENDING  Incomplete  Blood Culture ID Panel (Reflexed)     Status: Abnormal   Collection Time: 10/11/17  3:23 PM  Result Value Ref Range Status   Enterococcus species NOT DETECTED NOT DETECTED Final   Listeria monocytogenes NOT DETECTED NOT DETECTED Final   Staphylococcus species NOT DETECTED NOT DETECTED Final   Staphylococcus aureus NOT DETECTED NOT DETECTED Final   Streptococcus species DETECTED (A) NOT DETECTED Final    Comment: Not Enterococcus species, Streptococcus agalactiae, Streptococcus pyogenes, or Streptococcus pneumoniae. CRITICAL RESULT CALLED TO, READ BACK BY AND VERIFIED WITH: DAVID BESANTI AT 8657 10/12/17.PMH    Streptococcus agalactiae NOT DETECTED NOT DETECTED Final   Streptococcus pneumoniae NOT DETECTED NOT DETECTED Final   Streptococcus pyogenes NOT DETECTED NOT DETECTED Final   Acinetobacter baumannii NOT DETECTED NOT DETECTED Final   Enterobacteriaceae species NOT DETECTED NOT DETECTED Final   Enterobacter cloacae complex NOT DETECTED NOT DETECTED Final   Escherichia coli NOT DETECTED NOT DETECTED Final   Klebsiella oxytoca NOT DETECTED NOT DETECTED Final   Klebsiella pneumoniae NOT DETECTED NOT DETECTED Final   Proteus species NOT DETECTED NOT DETECTED Final   Serratia marcescens NOT DETECTED NOT DETECTED Final   Haemophilus influenzae NOT DETECTED NOT DETECTED Final   Neisseria meningitidis NOT DETECTED NOT DETECTED Final   Pseudomonas aeruginosa NOT DETECTED NOT DETECTED Final   Candida albicans NOT DETECTED NOT DETECTED Final   Candida glabrata NOT DETECTED NOT DETECTED Final   Candida krusei NOT DETECTED NOT DETECTED  Final   Candida parapsilosis NOT DETECTED NOT DETECTED Final   Candida tropicalis NOT DETECTED NOT DETECTED Final    Comment:  Performed at [facility], [address] [place], [place] [address]     Current Facility-Administered Medications:  .  0.9 %  sodium chloride infusion, , Intravenous, Continuous, Sudini, Srikar, MD, Last Rate: 50 mL/hr at 10/12/17 0850 .  acetaminophen (TYLENOL) tablet 650 mg, 650 mg, Oral, Q6H PRN, 650 mg at 10/12/17 6045 **OR** acetaminophen (TYLENOL) suppository 650 mg, 650 mg, Rectal, Q6H PRN, Sainani, Vivek J, MD .  albuterol (PROVENTIL) (2.5 MG/3ML) 0.083% nebulizer solution 3 mL, 3 mL, Inhalation, Q6H PRN, Henreitta Leber, MD .  aspirin EC tablet 81 mg, 81 mg, Oral, Daily, Sainani, Vivek J, MD .  atorvastatin (LIPITOR) tablet 10 mg, 10 mg, Oral, Daily, Henreitta Leber, MD, 10 mg at 10/11/17 2321 .  ceFEPIme (MAXIPIME) 2 g in sodium chloride 0.9 % 100 mL IVPB, 2 g, Intravenous, Q8H, Shuder, Lyman, RPH, Stopped at 10/12/17 1448 .  docusate sodium (COLACE) capsule 100 mg, 100 mg, Oral, BID, Henreitta Leber, MD, 100 mg at 10/11/17 2202 .  enoxaparin (LOVENOX) injection 40 mg, 40 mg, Subcutaneous, Q24H, Sainani, Belia Heman, MD, 40 mg at 10/11/17 2202 .  fluticasone (FLONASE) 50 MCG/ACT nasal spray 2 spray, 2 spray, Each Nare, Daily, Sainani, Vivek J, MD .  gabapentin (NEURONTIN) capsule 300 mg, 300 mg, Oral, BID, Henreitta Leber, MD, 300 mg at 10/11/17 2203 .  magnesium hydroxide (MILK OF MAGNESIA) suspension 30-45 mL, 30-45 mL, Oral, Daily PRN, Henreitta Leber, MD .  MEDLINE mouth rinse, 15 mL, Mouth Rinse, BID, Sudini, Srikar, MD, 15 mL at 10/11/17 2322 .  mometasone-formoterol (DULERA) 200-5 MCG/ACT inhaler 2 puff, 2 puff, Inhalation, BID, Henreitta Leber, MD, 2 puff at 10/11/17 2321 .  montelukast (SINGULAIR) tablet 10 mg, 10 mg, Oral, QHS, Sainani, Belia Heman, MD, 10 mg at 10/11/17 2203 .  ondansetron (ZOFRAN) tablet 4 mg, 4 mg, Oral, Q6H PRN **OR** ondansetron (ZOFRAN) injection 4 mg, 4 mg, Intravenous, Q6H PRN, Sainani, Vivek J, MD .  ondansetron (ZOFRAN-ODT)  disintegrating tablet 8 mg, 8 mg, Oral, Q8H PRN, Sainani, Vivek J, MD .  oxyCODONE-acetaminophen (PERCOCET/ROXICET) 5-325 MG per tablet 1 tablet, 1 tablet, Oral, Q8H PRN, Henreitta Leber, MD, 1 tablet at 10/12/17 1413 .  pantoprazole (PROTONIX) EC tablet 40 mg, 40 mg, Oral, Daily, Sainani, Vivek J, MD .  potassium chloride 10 mEq in 100 mL IVPB, 10 mEq, Intravenous, Q1 Hr x 6, Sudini, Srikar, MD, Last Rate: 100 mL/hr at 10/12/17 1518, 10 mEq at 10/12/17 1518 .  sodium chloride flush (NS) 0.9 % injection 10-40 mL, 10-40 mL, Intracatheter, Q12H, Sudini, Srikar, MD, 10 mL at 10/11/17 2110 .  sodium chloride flush (NS) 0.9 % injection 10-40 mL, 10-40 mL, Intracatheter, PRN, Sudini, Srikar, MD .  vancomycin (VANCOCIN) IVPB 750 mg/150 ml premix, 750 mg, Intravenous, Q36H, Merlyn Lot, MD  IMAGING    Ct Head Wo Contrast  Result Date: 10/12/2017 CLINICAL DATA:  Altered level of consciousness. EXAM: CT HEAD WITHOUT CONTRAST TECHNIQUE: Contiguous axial images were obtained from the base of the skull through the vertex without intravenous contrast. COMPARISON:  06/09/2017 FINDINGS: Brain: There is no evidence for acute hemorrhage, hydrocephalus, mass lesion, or abnormal extra-axial fluid collection. No definite CT evidence for acute infarction. Vascular: No hyperdense vessel or unexpected calcification. Skull: No evidence for fracture. No worrisome lytic or sclerotic lesion. Sinuses/Orbits: The  visualized paranasal sinuses and mastoid air cells are clear. Visualized portions of the globes and intraorbital fat are unremarkable. Other: None. IMPRESSION: Stable. Negative head CT without new or acute intracranial abnormality. Electronically Signed   By: Misty Stanley M.D.   On: 10/12/2017 12:20   Ct Abdomen Pelvis W Contrast  Result Date: 10/11/2017 CLINICAL DATA:  Metastatic breast cancer with abdominal and small bowel involvement. Status post Roux-en-Y for bowel obstruction EXAM: CT ABDOMEN AND PELVIS  WITH CONTRAST TECHNIQUE: Multidetector CT imaging of the abdomen and pelvis was performed using the standard protocol following bolus administration of intravenous contrast. CONTRAST:  92mL ISOVUE-300 IOPAMIDOL (ISOVUE-300) INJECTION 61% COMPARISON:  09/22/2017 FINDINGS: Lower chest: Unremarkable. Hepatobiliary: No focal abnormality in the liver parenchyma. There is no evidence for gallstones, gallbladder wall thickening, or pericholecystic fluid. No intrahepatic or extrahepatic biliary dilation. Pancreas: No focal mass lesion. No dilatation of the main duct. No intraparenchymal cyst. No peripancreatic edema. Spleen: No splenomegaly. No focal mass lesion. Adrenals/Urinary Tract: No adrenal nodule or mass. Similar appearance mild right hydronephrosis mild fullness left intrarenal collecting system is stable. Circumferential bladder wall thickening is slightly more prominent than before. Stomach/Bowel: Status post gastric bypass. There is edema in the retroperitoneal tissues around the transverse duodenum and duodenal bulb appears distended. These features are similar to prior study. No overt small bowel obstruction. Terminal ileum shows hyperenhancement. Focus of hyperenhancement identified in small bowel anterior left abdomen (image 46 series 2). As on the prior study, there is wall thickening and hyperenhancement in the ascending colon. Colon is diffusely decompressed. Vascular/Lymphatic: There is abdominal aortic atherosclerosis without aneurysm. There is no gastrohepatic or hepatoduodenal ligament lymphadenopathy. No intraperitoneal or retroperitoneal lymphadenopathy. No pelvic sidewall lymphadenopathy. Reproductive: Uterus surgically absent. Other: Ill-defined heterogeneously enhancing lesion is identified in the right posterior pelvis (image 50 series 2) measuring 3.8 x 4.4 cm. The ureter can be seen passing posterior to this suggesting that it is along the posterior peritoneal surface. Metastatic lesions  suspected. Hematoma or abscess considered less likely but not excluded. Musculoskeletal: Bone windows reveal no worrisome lytic or sclerotic osseous lesions. IMPRESSION: 1. Interval development 3.8 x 4.4 cm heterogeneously enhancing lesion posterior right pelvis suspicious for metastatic involvement. This does have some low attenuation and hematoma or abscess would be considered less likely possibilities. Close follow-up recommended. 2. As on prior study, there are multiple areas of abnormal enhancement in the bowel of the left abdomen. Abnormal wall thickening and enhancement is seen in the terminal ileum and right colon as well. Imaging features suggest metastatic involvement. 3. Mild duodenal distention with edema in the retroperitoneal tissues around the duodenum. 4. Small volume intraperitoneal free fluid 5. Similar appearance of right greater than left hydronephrosis. Electronically Signed   By: Misty Stanley M.D.   On: 10/11/2017 17:28   Dg Chest Port 1 View  Result Date: 10/11/2017 CLINICAL DATA:  Shortness of breath with fever and chills. EXAM: PORTABLE CHEST 1 VIEW COMPARISON:  09/21/2017 FINDINGS: 1535 hours. Lungs are hyperexpanded. The lungs are clear without focal pneumonia, edema, pneumothorax or pleural effusion. The cardiopericardial silhouette is within normal limits for size. Right Port-A-Cath is stable. The visualized bony structures of the thorax are intact. IMPRESSION: Stable.  No acute findings. Electronically Signed   By: Misty Stanley M.D.   On: 10/11/2017 15:56     MAJOR EVENTS/TEST RESULTS: Hypotension and poor mentation   INDWELLING DEVICES::  MICRO DATA: MRSA PCR- Negative Urine - pending Blood- Streptococcus species  ANTIMICROBIALS:  2/15 Cefepime; vancomycin   ASSESSMENT/PLAN   1. Sepsis with shock 2. Streptococcus species bacteremia 3. Acute Metabolic Encephalopathy which has improved with improvement of BP 4. Demand Ischemia 5. Severe Hypokalemia 6.  Hypovolemia 7. Critical illness anemia with history of occult blood positive- will monitor closely as patient is on Lovenox 8. History of metastatic breast cancer not yet started on treatment  Plan 1. Volume resuscitation 2. Continue on Cefepime and Vancomycin therapy pending ID and sensitivity. Will need ECHO by monday 3. Electrolytes replacement 4. Monitor hemoglobin on Lovenox for DVT prophylaxis 5. Incentive spirometry 6. Advance diet 7. Avoid hypotension, will start vasopressor support as needed.    I have personally obtained a history, examined the patient, evaluated laboratory and independently reviewed  imaging results, formulated the assessment and plan and placed orders.  I have dedicated a total of 40 minutes in critical care time minus all appropriate exclusions. Overall, patient is critically ill, prognosis is guarded.  I have updated the patient's son and mother.  [name], M.D.  [facility]

## 2017-10-12 NOTE — Progress Notes (Addendum)
[facility] NAME: Sophia Nelson    MR#:  [id number]  DATE OF BIRTH:  [birth year]  SUBJECTIVE:  CHIEF COMPLAINT:   Chief Complaint  Patient presents with  . Code Sepsis   Patient is drowsy but does wake up on calling her name and then goes back to sleep.  Poor historian.  Patient was hypotensive and responded to 3 L of fluid on admission.  Blood pressure has been low normal and earlier today was with systolic of 40J and was given 1.5 L of normal saline.  Blood pressure has again returned to 77/51.  Was febrile at 102.5 earlier.  REVIEW OF SYSTEMS:    Review of Systems  Unable to perform ROS: Mental status change    DRUG ALLERGIES:   Allergies  Allergen Reactions  . Other Other (See Comments)    Onions(that grow in the yard-pt can eat onions without problems) and dust mite Sneezing, cough, runny nose    VITALS:  Blood pressure (!) 77/49, pulse 85, temperature 98.2 F (36.8 C), resp. rate 18, height 5\' 2"  (1.575 m), weight 52.6 kg (115 lb 15.4 oz), SpO2 98 %.  PHYSICAL EXAMINATION:   Physical Exam  GENERAL:  72 y.o.-year-old patient lying in the bed EYES: Pupils equal, round, reactive to light and accommodation.  HEENT: Head atraumatic, normocephalic. Oropharynx and nasopharynx clear.  NECK:  Supple, no jugular venous distention. No thyroid enlargement, no tenderness.  LUNGS: Bibasilar crackles CARDIOVASCULAR: S1, S2.  Tachycardic ABDOMEN: Soft, nontender, nondistended. Bowel sounds present. No organomegaly or mass.  EXTREMITIES: No cyanosis, clubbing or edema b/l.    NEUROLOGIC: Cranial nerves II through XII are intact. No focal Motor or sensory deficits b/l.   PSYCHIATRIC: The patient is drowsy  LABORATORY PANEL:   CBC Recent Labs  Lab 10/12/17 0633  WBC 23.1*  HGB 8.4*  HCT 26.2*  PLT 380    ------------------------------------------------------------------------------------------------------------------ Chemistries  Recent Labs  Lab 10/11/17 1522 10/12/17 0633  NA 140 138  K 2.9* 2.9*  CL 98* 102  CO2 29 24  GLUCOSE 116* 77  BUN <5* 6  CREATININE 0.45 0.50  CALCIUM 9.1 7.8*  AST 224*  --   ALT 44  --   ALKPHOS 390*  --   BILITOT 1.9*  --    ------------------------------------------------------------------------------------------------------------------  Cardiac Enzymes Recent Labs  Lab 10/12/17 0633  TROPONINI 0.44*   ------------------------------------------------------------------------------------------------------------------  RADIOLOGY:  Ct Abdomen Pelvis W Contrast  Result Date: 10/11/2017 CLINICAL DATA:  Metastatic breast cancer with abdominal and small bowel involvement. Status post Roux-en-Y for bowel obstruction EXAM: CT ABDOMEN AND PELVIS WITH CONTRAST TECHNIQUE: Multidetector CT imaging of the abdomen and pelvis was performed using the standard protocol following bolus administration of intravenous contrast. CONTRAST:  71mL ISOVUE-300 IOPAMIDOL (ISOVUE-300) INJECTION 61% COMPARISON:  09/22/2017 FINDINGS: Lower chest: Unremarkable. Hepatobiliary: No focal abnormality in the liver parenchyma. There is no evidence for gallstones, gallbladder wall thickening, or pericholecystic fluid. No intrahepatic or extrahepatic biliary dilation. Pancreas: No focal mass lesion. No dilatation of the main duct. No intraparenchymal cyst. No peripancreatic edema. Spleen: No splenomegaly. No focal mass lesion. Adrenals/Urinary Tract: No adrenal nodule or mass. Similar appearance mild right hydronephrosis mild fullness left intrarenal collecting system is stable. Circumferential bladder wall thickening is slightly more prominent than before. Stomach/Bowel: Status post gastric bypass. There is edema in the retroperitoneal tissues around the transverse duodenum and duodenal  bulb appears distended. These features are similar to prior study. No  overt small bowel obstruction. Terminal ileum shows hyperenhancement. Focus of hyperenhancement identified in small bowel anterior left abdomen (image 46 series 2). As on the prior study, there is wall thickening and hyperenhancement in the ascending colon. Colon is diffusely decompressed. Vascular/Lymphatic: There is abdominal aortic atherosclerosis without aneurysm. There is no gastrohepatic or hepatoduodenal ligament lymphadenopathy. No intraperitoneal or retroperitoneal lymphadenopathy. No pelvic sidewall lymphadenopathy. Reproductive: Uterus surgically absent. Other: Ill-defined heterogeneously enhancing lesion is identified in the right posterior pelvis (image 50 series 2) measuring 3.8 x 4.4 cm. The ureter can be seen passing posterior to this suggesting that it is along the posterior peritoneal surface. Metastatic lesions suspected. Hematoma or abscess considered less likely but not excluded. Musculoskeletal: Bone windows reveal no worrisome lytic or sclerotic osseous lesions. IMPRESSION: 1. Interval development 3.8 x 4.4 cm heterogeneously enhancing lesion posterior right pelvis suspicious for metastatic involvement. This does have some low attenuation and hematoma or abscess would be considered less likely possibilities. Close follow-up recommended. 2. As on prior study, there are multiple areas of abnormal enhancement in the bowel of the left abdomen. Abnormal wall thickening and enhancement is seen in the terminal ileum and right colon as well. Imaging features suggest metastatic involvement. 3. Mild duodenal distention with edema in the retroperitoneal tissues around the duodenum. 4. Small volume intraperitoneal free fluid 5. Similar appearance of right greater than left hydronephrosis. Electronically Signed   By: Misty Stanley M.D.   On: 10/11/2017 17:28   Dg Chest Port 1 View  Result Date: 10/11/2017 CLINICAL DATA:  Shortness of  breath with fever and chills. EXAM: PORTABLE CHEST 1 VIEW COMPARISON:  09/21/2017 FINDINGS: 1535 hours. Lungs are hyperexpanded. The lungs are clear without focal pneumonia, edema, pneumothorax or pleural effusion. The cardiopericardial silhouette is within normal limits for size. Right Port-A-Cath is stable. The visualized bony structures of the thorax are intact. IMPRESSION: Stable.  No acute findings. Electronically Signed   By: Misty Stanley M.D.   On: 10/11/2017 15:56     ASSESSMENT AND PLAN:   72 year old female with past medical history of breast cancer currently undergoing chemotherapy, hypertension, diabetes, COPD, previous history of pancreatitis, chronic back pain, previous history of gastric outlet obstruction who presents to the hospital due to fever, hypotension weakness lethargy and malaise.  * Septic shock secondary to Streptococcus bacteremia.  Etiology could be bowel with her abdominal metastatic cysts around the bowel. On broad-spectrum antibiotics with IV cefepime and vancomycin.  Wait for final culture results. Patient continues to be hypotensive with systolic blood pressure in the 70s in spite of aggressive fluid resuscitation.  Will need pressor support.  Will transfer to ICU.  Discussed with Dr. Celesta Aver.  * Acute toxic metabolic encephalopathy. Blood glucose was 77.  Will repeat.  Check stat CT scan of the head.  *Elevated troponin likely due to demand ischemia.  No significant elevation on repeat troponin check.  * Metastatic breast cancer.  Follows with cancer center as outpatient.  Discussed with Dr. Janese Banks.  * Anemia of chronic disease is stable  *DVT prophylaxis with Lovenox  All the records are reviewed and case discussed with Care Management/Social Worker Management plans discussed with the patient, family and they are in agreement.  CODE STATUS: FULL CODE  DVT Prophylaxis: SCDs  TOTAL CRITICAL CARE TIME TAKING CARE OF THIS PATIENT: 40 minutes.   Neita Carp M.D on 10/12/2017 at 11:39 AM  Between 7am to 6pm - Pager - [phone]  After 6pm go to [email or web address] -  password [email or web address] [facility]  Office  [phone]  CC: Primary care physician; Donnie Coffin, MD  Note: This dictation was prepared with Dragon dictation along with smaller phrase technology. Any transcriptional errors that result from this process are unintentional.

## 2017-10-12 NOTE — Progress Notes (Signed)
Patient admitted from 1C due to hypotension and lethargy. Patient received fluid boluses on 1C and was brought to ICU after head CT. Patient's RN on 1C states patient began perking up after CT. Upon arrival in ICU patient alert and oriented but states she doesn't remember arriving in the hospital. Patient's blood pressure maintained with maintenance fluids in ICU, which were increased to 100 mL/hr. PRN pain medication given for headache.

## 2017-10-12 NOTE — Progress Notes (Signed)
   10/12/17 1650  Clinical Encounter Type  Visited With Patient  Visit Type Follow-up (advanced directive order)   Chaplain spoke to patient about advanced directives and patient directed chaplain to speak to her son; son will not be back until tomorrow morning per patient.  Chaplain attempted to clarify if patient or son was seeking the information; patient asked for a chaplain to speak to her son in the morning.

## 2017-10-12 NOTE — Progress Notes (Signed)
   10/12/17 1025  Clinical Encounter Type  Visited With Patient not available;Health care provider  Visit Type Initial (order for advanced directive)  Referral From Nurse  Consult/Referral To Chaplain   Chaplain attempted to respond to order for advanced directive.  Patient did not respond to chaplain knock or voice.  Chaplain consulted staff; staff reported that son would be visiting patient later if chaplain wanted to attempt to connect.

## 2017-10-12 NOTE — Progress Notes (Signed)
Dr Jerelyn Charles made aware of troponin trends.  No voiced complaints of chest pain.  ST 110s continues on telemetry.

## 2017-10-12 NOTE — Consult Note (Signed)
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  Tumor was ER PR positive and HER-2/neu negative.  She was initiated on letrozole plus Ibrance then sitched to faslodex and abemaciclib. Most recently found to have gastric outlet obstruction and small bowel involvement in July 2018. She underwent gastric bypass and has been on weekly taxol chemotherapy since dec 2018. Last dose on 10/04/17  He was admitted to the hospital yesterday for symptoms of worsening weakness and lethargy and was found to have a temperature of 101 in the ER.  Blood cultures 2 out of 2 are positive for gram-positive positive cocci-Streptococcus species.  Patient had CT abdomen and pelvis with contrast done yesterday which showed interval development of 3.8 x 4.4 cm heterogeneously enhancing lesion in the posterior right pelvis suspicious for metastatic involvement.  Hematoma or abscess would be considered less likely possibilities.  Abnormal enhancement of the bowel of the left abdomen as well as thickening and enhancement of the terminal ileum and right colon seen as on prior study concerning for metastatic involvement.  Mild duodenal distention with edema in the retroperitoneal tissues around the duo review denum.  Chest  x-ray did not reveal any evidence of pneumonia  Patient is currently on cefepime and IV vancomycin for gram-positive septicemia. Patient continues to be hypotensive and her blood pressure systolic remains in the 83F and she is moving to stepdown for the same for possible need for pressors. Her BP improved to 90's after 3 L fluids and she has not needed pressors. She was confused on admission and mental status improved after IV antibiotics and IVF  She reports feeling fatigued. Denies any abdominal pain or SOB   Pain scale- 0   Review of systems- Review of Systems  Constitutional: Positive for malaise/fatigue. Negative for chills, fever and weight loss.  HENT: Negative for congestion, ear discharge and nosebleeds.   Eyes: Negative for blurred vision.  Respiratory: Negative for cough, hemoptysis, sputum production, shortness of breath and wheezing.   Cardiovascular: Negative for chest pain, palpitations, orthopnea and claudication.  Gastrointestinal: Negative for abdominal pain, blood in stool, constipation, diarrhea, heartburn, melena, nausea and vomiting.  Genitourinary: Negative for dysuria, flank pain, frequency, hematuria and urgency.  Musculoskeletal: Negative for back pain, joint pain and myalgias.  Skin: Negative for rash.  Neurological: Positive for weakness. Negative for dizziness, tingling, focal weakness, seizures and headaches.  Endo/Heme/Allergies: Does not bruise/bleed easily.  Psychiatric/Behavioral: Negative for depression and suicidal ideas. The patient does not have insomnia.     Allergies  Allergen Reactions  . Other Other (See Comments)    Onions(that grow in the yard-pt can eat onions without problems) and dust mite Sneezing, cough, runny nose    Patient Active Problem List   Diagnosis Date Noted  . Sepsis ([place]) 10/11/2017  . Acute respiratory distress   . Symptomatic anemia   . Acute on  chronic respiratory failure with hypoxia ([place]) 09/21/2017  .  Cholecystitis 08/07/2017  . Syncope   . Metastatic breast cancer ([place])   . Hypotension   . Palliative care by specialist   . Goals of care, counseling/discussion   . Severe sepsis with septic shock ([place]) 06/09/2017  . Hypocalcemia 05/30/2017  . Metastasis to bone ([place]) 04/30/2017  . Acute back pain 03/28/2017  . Back pain, chronic 03/15/2017  . Chronic lumbar pain 03/15/2017  . Recurrent low back pain 03/15/2017  . Duodenal obstruction   . Pancreatitis, acute 03/04/2017  . Abdominal distention   . Encounter for nasogastric (NG) tube placement   . Gastric outlet obstruction 02/24/2017  . AKI (acute kidney injury) ([place]) 02/24/2017  . GERD (gastroesophageal reflux disease) 02/24/2017  . Diabetes ([place]) 02/24/2017  . COPD (chronic obstructive pulmonary disease) ([place]) 02/24/2017  . Neck pain 04/19/2016  . Carcinoma of overlapping sites of left breast in female, estrogen receptor positive ([place]) 03/22/2016     Past Medical History:  Diagnosis Date  . Abdominal distention   . AKI (acute kidney injury) ([place]) 02/24/2017  . Anemia   . Arthritis   . Asthma   . Back pain, chronic 03/15/2017  . Breast cancer ([place]) 2009   RT MASTECTOMY, DCIS  . 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 Cancer of left breast ([place]) 08/09/2015   T4 N1 M1 tumor, INVASIVE LOBULAR CARCINOMA.  . Carcinoma of overlapping sites of left breast in female, estrogen receptor positive ([place]) 03/22/2016  . COPD (chronic obstructive pulmonary disease) ([place])   . Diabetes ([place]) 02/24/2017  . Diabetes mellitus without complication ([place])   . Duodenal obstruction   . Encounter for nasogastric (NG) tube placement   . Gastric outlet obstruction 02/24/2017  . GERD (gastroesophageal reflux disease)   . Headache    h/o migraines as a child  . Hypertension   . Neck pain 04/19/2016  . Pancreatitis, acute 03/04/2017  . Pneumonia 2015  .  Recurrent low back pain 03/15/2017  . Shortness of breath dyspnea    with exertion     Past Surgical History:  Procedure Laterality Date  . ABDOMINAL HYSTERECTOMY    . BREAST SURGERY Right 2015   mastectomy  . ESOPHAGOGASTRODUODENOSCOPY N/A 02/25/2017   Procedure: ESOPHAGOGASTRODUODENOSCOPY (EGD);  Surgeon: Jonathon Bellows, MD;  Location[facility] ENDOSCOPY;  Service: Endoscopy;  Laterality: N/A;  . ESOPHAGOGASTRODUODENOSCOPY (EGD) WITH PROPOFOL N/A 03/06/2017   Procedure: ESOPHAGOGASTRODUODENOSCOPY (EGD) WITH PROPOFOL;  Surgeon: [name], MD;  Location: [facility] ENDOSCOPY;  Service: Endoscopy;  Laterality: N/A;  . GASTROJEJUNOSTOMY N/A 03/08/2017   Procedure: Roux-en-Y gastrojejunostomy Bypass of Gastric Outlet Obstruction, small bowel resection;  Surgeon: Vickie Epley, MD;  Location: [facility] ORS;  Service: General;  Laterality: N/A;  . PORTACATH PLACEMENT Right 08/01/2017   Procedure: INSERTION PORT-A-CATH;  Surgeon: Christene Lye, MD;  Location: [facility] ORS;  Service: General;  Laterality: Right;  . SIMPLE MASTECTOMY WITH AXILLARY SENTINEL NODE BIOPSY Left 02/29/2016   Procedure: SIMPLE MASTECTOMY;  Surgeon: Christene Lye, MD;  Location: [facility] ORS;  Service: General;  Laterality: Left;  . TUBAL LIGATION      Social History   Socioeconomic History  . Marital status: Divorced    Spouse name: Not on file  . Number of children: Not on file  . Years of education: Not on file  . Highest education level: Not on file  Social Needs  .  Financial resource strain: Not on file  . Food insecurity - worry: Not on file  . Food insecurity - inability: Not on file  . Transportation needs - medical: Not on file  . Transportation needs - non-medical: Not on file  Occupational History  . Not on file  Tobacco Use  . Smoking status: Former Smoker    Packs/day: 0.25    Years: 15.00    Pack years: 3.75    Types: Cigarettes    Last attempt to quit: 07/23/2017    Years since quitting: 0.2  .  Smokeless tobacco: Never Used  . Tobacco comment: currently as of 02-21-16 pt states she is only smoking 2-3 cigarettes per day  Substance and Sexual Activity  . 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 Drug use: No  . Sexual activity: Not on file  Other Topics Concern  . Not on file  Social History Narrative  . Not on file     Family History  Problem Relation Age of Onset  . Lung cancer Father   . Cancer Maternal Aunt   . Dementia Mother   . Breast cancer Neg Hx      Current Facility-Administered Medications:  .  0.9 %  sodium chloride infusion, , Intravenous, Continuous, Sudini, Srikar, MD, Last Rate: 50 mL/hr at 10/12/17 0850 .  acetaminophen (TYLENOL) tablet 650 mg, 650 mg, Oral, Q6H PRN, 650 mg at 10/12/17 6734 **OR** acetaminophen (TYLENOL) suppository 650 mg, 650 mg, Rectal, Q6H PRN, Sainani, Vivek J, MD .  albuterol (PROVENTIL) (2.5 MG/3ML) 0.083% nebulizer solution 3 mL, 3 mL, Inhalation, Q6H PRN, Sainani, Belia Heman, MD .  aspirin EC tablet 81 mg, 81 mg, Oral, Daily, Henreitta Leber, MD, 81 mg at 10/12/17 0848 .  atorvastatin (LIPITOR) tablet 10 mg, 10 mg, Oral, Daily, Henreitta Leber, MD, 10 mg at 10/11/17 2321 .  ceFEPIme (MAXIPIME) 2 g in sodium chloride 0.9 % 100 mL IVPB, 2 g, Intravenous, Q8H, Shuder, Clarence, RPH, Stopped at 10/12/17 1937 .  docusate sodium (COLACE) capsule 100 mg, 100 mg, Oral, BID, Henreitta Leber, MD, 100 mg at 10/12/17 0848 .  enoxaparin (LOVENOX) injection 40 mg, 40 mg, Subcutaneous, Q24H, Sainani, Belia Heman, MD, 40 mg at 10/11/17 2202 .  fluticasone (FLONASE) 50 MCG/ACT nasal spray 2 spray, 2 spray, Each Nare, Daily, Henreitta Leber, MD, 2 spray at 10/12/17 0850 .  gabapentin (NEURONTIN) capsule 300 mg, 300 mg, Oral, BID, Henreitta Leber, MD, 300 mg at 10/12/17 0846 .  magnesium hydroxide (MILK OF MAGNESIA) suspension 30-45  mL, 30-45 mL, Oral, Daily PRN, Henreitta Leber, MD .  MEDLINE mouth rinse, 15 mL, Mouth Rinse, BID, Sudini, Srikar, MD, 15 mL at 10/12/17 0851 .  mometasone-formoterol (DULERA) 200-5 MCG/ACT inhaler 2 puff, 2 puff, Inhalation, BID, Henreitta Leber, MD, 2 puff at 10/12/17 0850 .  montelukast (SINGULAIR) tablet 10 mg, 10 mg, Oral, QHS, Sainani, Belia Heman, MD, 10 mg at 10/11/17 2203 .  ondansetron (ZOFRAN) tablet 4 mg, 4 mg, Oral, Q6H PRN **OR** ondansetron (ZOFRAN) injection 4 mg, 4 mg, Intravenous, Q6H PRN, Sainani, Vivek J, MD .  ondansetron (ZOFRAN-ODT) disintegrating tablet 8 mg, 8 mg, Oral, Q8H PRN, Sainani, Belia Heman, MD .  oxyCODONE-acetaminophen (PERCOCET/ROXICET) 5-325 MG per tablet 1 tablet, 1 tablet, Oral, Q8H PRN, Verdell Carmine, Belia Heman, MD, 1  tablet at 10/11/17 2202 .  pantoprazole (PROTONIX) EC tablet 40 mg, 40 mg, Oral, Daily, Henreitta Leber, MD, 40 mg at 10/12/17 0848 .  potassium chloride SA (K-DUR,KLOR-CON) CR tablet 40 mEq, 40 mEq, Oral, Q4H, Sudini, Srikar, MD, 40 mEq at 10/12/17 0848 .  sodium chloride flush (NS) 0.9 % injection 10-40 mL, 10-40 mL, Intracatheter, Q12H, Sudini, Srikar, MD, 10 mL at 10/11/17 2110 .  sodium chloride flush (NS) 0.9 % injection 10-40 mL, 10-40 mL, Intracatheter, PRN, Sudini, Srikar, MD .  vancomycin (VANCOCIN) IVPB 750 mg/150 ml premix, 750 mg, Intravenous, Q36H, Merlyn Lot, MD   Physical exam:  Vitals:   10/12/17 0551 10/12/17 0805 10/12/17 1000 10/12/17 1020  BP: (!) 114/54 (!) 71/41 (!) 93/51 101/61  Pulse: (!) 106 (!) 108 89 85  Resp: (!) 21 18    Temp: (!) 102.8 F (39.3 C) 100 F (37.8 C) 98 F (36.7 C) 98 F (36.7 C)  TempSrc: Oral   Oral  SpO2: 100% 100% 98%   Weight: 115 lb 15.4 oz (52.6 kg)     Height:       Physical Exam  Constitutional: She is oriented to person, place, and time.  Thin, appears fatigued. No acute distress  HENT:  Head: Normocephalic and atraumatic.  Eyes: EOM are normal. Pupils are equal, round, and  reactive to light.  Neck: Normal range of motion.  Cardiovascular: Regular rhythm and normal heart sounds.  tachycardic  Pulmonary/Chest: Effort normal and breath sounds normal.  Abdominal: Soft. Bowel sounds are normal. She exhibits no distension. There is no tenderness.  Neurological: She is alert and oriented to person, place, and time.  Skin: Skin is warm and dry.       CMP Latest Ref Rng & Units 10/12/2017  Glucose 65 - 99 mg/dL 77  BUN 6 - 20 mg/dL 6  Creatinine 0.44 - 1.00 mg/dL 0.50  Sodium 135 - 145 mmol/L 138  Potassium 3.5 - 5.1 mmol/L 2.9(L)  Chloride 101 - 111 mmol/L 102  CO2 22 - 32 mmol/L 24  Calcium 8.9 - 10.3 mg/dL 7.8(L)  Total Protein 6.5 - 8.1 g/dL -  Total Bilirubin 0.3 - 1.2 mg/dL -  Alkaline Phos 38 - 126 U/L -  AST 15 - 41 U/L -  ALT 14 - 54 U/L -   CBC Latest Ref Rng & Units 10/12/2017  WBC 3.6 - 11.0 K/uL 23.1(H)  Hemoglobin 12.0 - 16.0 g/dL 8.4(L)  Hematocrit 35.0 - 47.0 % 26.2(L)  Platelets 150 - 440 K/uL 380    @IMAGES @  Ct Abdomen Pelvis W Contrast  Result Date: 10/11/2017 CLINICAL DATA:  Metastatic breast cancer with abdominal and small bowel involvement. Status post Roux-en-Y for bowel obstruction EXAM: CT ABDOMEN AND PELVIS WITH CONTRAST TECHNIQUE: Multidetector CT imaging of the abdomen and pelvis was performed using the standard protocol following bolus administration of intravenous contrast. CONTRAST:  12m ISOVUE-300 IOPAMIDOL (ISOVUE-300) INJECTION 61% COMPARISON:  09/22/2017 FINDINGS: Lower chest: Unremarkable. Hepatobiliary: No focal abnormality in the liver parenchyma. There is no evidence for gallstones, gallbladder wall thickening, or pericholecystic fluid. No intrahepatic or extrahepatic biliary dilation. Pancreas: No focal mass lesion. No dilatation of the main duct. No intraparenchymal cyst. No peripancreatic edema. Spleen: No splenomegaly. No focal mass lesion. Adrenals/Urinary Tract: No adrenal nodule or mass. Similar appearance mild  right hydronephrosis mild fullness left intrarenal collecting system is stable. Circumferential bladder wall thickening is slightly more prominent than before. Stomach/Bowel: Status post gastric bypass. There is edema  in the retroperitoneal tissues around the transverse duodenum and duodenal bulb appears distended. These features are similar to prior study. No overt small bowel obstruction. Terminal ileum shows hyperenhancement. Focus of hyperenhancement identified in small bowel anterior left abdomen (image 46 series 2). As on the prior study, there is wall thickening and hyperenhancement in the ascending colon. Colon is diffusely decompressed. Vascular/Lymphatic: There is abdominal aortic atherosclerosis without aneurysm. There is no gastrohepatic or hepatoduodenal ligament lymphadenopathy. No intraperitoneal or retroperitoneal lymphadenopathy. No pelvic sidewall lymphadenopathy. Reproductive: Uterus surgically absent. Other: Ill-defined heterogeneously enhancing lesion is identified in the right posterior pelvis (image 50 series 2) measuring 3.8 x 4.4 cm. The ureter can be seen passing posterior to this suggesting that it is along the posterior peritoneal surface. Metastatic lesions suspected. Hematoma or abscess considered less likely but not excluded. Musculoskeletal: Bone windows reveal no worrisome lytic or sclerotic osseous lesions. IMPRESSION: 1. Interval development 3.8 x 4.4 cm heterogeneously enhancing lesion posterior right pelvis suspicious for metastatic involvement. This does have some low attenuation and hematoma or abscess would be considered less likely possibilities. Close follow-up recommended. 2. As on prior study, there are multiple areas of abnormal enhancement in the bowel of the left abdomen. Abnormal wall thickening and enhancement is seen in the terminal ileum and right colon as well. Imaging features suggest metastatic involvement. 3. Mild duodenal distention with edema in the  retroperitoneal tissues around the duodenum. 4. Small volume intraperitoneal free fluid 5. Similar appearance of right greater than left hydronephrosis. Electronically Signed   By: Misty Stanley M.D.   On: 10/11/2017 17:28   Ct Abdomen Pelvis W Contrast  Result Date: 09/22/2017 CLINICAL DATA:  Abdominal distension and pain with bowel movements. History of breast cancer. EXAM: CT ABDOMEN AND PELVIS WITH CONTRAST TECHNIQUE: Multidetector CT imaging of the abdomen and pelvis was performed using the standard protocol following bolus administration of intravenous contrast. CONTRAST:  160m ISOVUE-300 IOPAMIDOL (ISOVUE-300) INJECTION 61% COMPARISON:  07/29/2017.  Head CT 03/07/2017 FINDINGS: Lower chest: Bilateral effusions right larger than left with dependent atelectasis. Hepatobiliary: No liver parenchymal abnormality is seen. No calcified gallstones. Pancreas: Normal Spleen: Normal Adrenals/Urinary Tract: Adrenal glands are normal. Mild fullness of both renal collecting systems and ureters with ureters being prominent all the way to the bladder. No bladder pathology is discernible. Stomach/Bowel: Chronically dilated stomach. Duodenum is also dilated and fluid filled. There is a very abnormal short segment of bowel at the duodenal jejunal junction which shows shouldering and contrast enhancement. The concern is that this could represent a small bowel tumor, either primary or metastatic. There is a second focus of abnormal small intestine probably in the ileum located in the central right portion of the abdomen that also looks like tumor. This elevates the likelihood of metastatic disease, though unusual. The differential diagnosis would be inflammatory bowel disease of some sort. There probably a third area of involvement in the right colon near the ileocecal valve region. No sign of diverticulosis or diverticulitis. No other focal bowel finding. Vascular/Lymphatic: Aortic atherosclerosis. No aneurysm. IVC is  normal. No retroperitoneal adenopathy. Reproductive: Previous hysterectomy.  No pelvic mass. Other: Moderate amount of free fluid in the pelvis, etiology uncertain. Musculoskeletal: Ordinary lumbar degenerative changes. Treated metastatic lesion of the L3 vertebral body no evidence of progressive change. IMPRESSION: Short-segment abnormal intestine at the duodenal jejunal junction with thickening and enhancement worrisome for primary tumor or metastatic tumor. There is a second focus of abnormal small intestine probably in the ileum in the  mid to right portion of the abdomen that is also quite likely secondary to tumor, elevating the possibility of metastatic disease. Lastly, one could question thickening and enhancement in the right colon near the ileocecal valve that could be a third focus of involvement. The patient has a history of breast cancer and presumably this would be secondary to that process. However, melanoma can metastasis eyes to the intestine. Chronically dilated stomach and duodenum. New demonstration of mild fullness of the renal collecting systems and ureters, etiology unknown. No evidence of stone disease or obstructing bladder lesion. Small to moderate amount of free fluid in the pelvic cul de sac. Aortic atherosclerosis. Small bilateral pleural effusions with dependent atelectasis right more than left. Treated metastasis of the L3 vertebra. Electronically Signed   By: Nelson Chimes M.D.   On: 09/22/2017 13:44   Dg Chest Port 1 View  Result Date: 10/11/2017 CLINICAL DATA:  Shortness of breath with fever and chills. EXAM: PORTABLE CHEST 1 VIEW COMPARISON:  09/21/2017 FINDINGS: 1535 hours. Lungs are hyperexpanded. The lungs are clear without focal pneumonia, edema, pneumothorax or pleural effusion. The cardiopericardial silhouette is within normal limits for size. Right Port-A-Cath is stable. The visualized bony structures of the thorax are intact. IMPRESSION: Stable.  No acute findings.  Electronically Signed   By: Misty Stanley M.D.   On: 10/11/2017 15:56   Dg Chest Port 1 View  Result Date: 09/21/2017 CLINICAL DATA:  Difficulty breathing EXAM: PORTABLE CHEST 1 VIEW COMPARISON:  08/08/2017, 08/07/2017 FINDINGS: Right-sided central venous port tip overlies the proximal right atrium. No focal pulmonary infiltrate or effusion. Stable chronic interstitial opacities at the bases. Normal heart size. Aortic atherosclerosis. No pneumothorax. IMPRESSION: Stable scarring and chronic interstitial changes at the bases. No acute infiltrate or edema. Electronically Signed   By: Donavan Foil M.D.   On: 09/21/2017 03:43    Assessment and plan- Patient is a 72 y.o. female with metastatic invasive lobular carcinoma of the breast with gastric outlet obstruction and involvement of the small bowel status post palliative bypass surgery.  She has been on weekly Taxol until recently last dose was on 10/04/2017.  She is presently admitted for gram-positive septicemia secondary to Streptococcus  Patient currently has worsening leukocytosis mainly neutrophilia along with severe hypotension and possible need for pressors.  She therefore has severe sepsis from gram-positive Streptococcus bacteremia.  Agree with continuing IV cefepime and vancomycin.  Please have ID on board.if she continues to have fever after 24 hours of IV antibiotics- recommend getting CT chest as well. Await final blood culture results to tailor antibiotic course. Blood cultures show not Enterococcus species, Streptococcus agalactiae, Streptococcus pyogenes, or Streptococcus pneumoniae. She may need TEE depending on final cultures. Recommend continuing IVF  With regards to her breast cancer patient was on hormone therapy in the past and-has been on chemotherapy since December 2018 and does have multiple treatment options down the line if he recovers from acute hospitalization. I would therefore recommend continuing aggressive medical measures at  this time to see if she recovers from acute septic shock episode. There is evidence of disease progression on present CT abdomen and she will need to discuss switching her treatment regimen with Dr. Rogue Bussing as an outpatient.  Patient has baseline anemia secondary to her malignancy and her hb is around 9.it is acutely worse than her baseline due to acute infection. Continue to monitor. She did have anemia work up in Jan 2019 which showed low B12 levels. Iron studies suggestive of  anemia of chronic disease  Hypokalemia- on replacement  Will continue to follow   Visit Diagnosis 1. Sepsis, due to unspecified organism [facility])     Dr. Randa Evens, MD, MPH [facility] Pager- [phone] 10/12/2017 4:02 PM

## 2017-10-12 NOTE — Progress Notes (Signed)
Patient transported to CCU after CT scan.Updated Report given to Sarah RN at bedside. Will make sure son knows she was sent to CCU stepdown.

## 2017-10-12 NOTE — Progress Notes (Signed)
Patients BP improved with bolus but then decreased after half an hour. Patient responds to voice but mumbles answers and falls back asleep, glucose 83. Not able to take her medications by mouth due to LOC. Dr. Darvin Neighbours notified. Orders received to transfer patient to Step Down after head CT. Report given to sarah RN in CCU.  Vitals:   10/12/17 1020 10/12/17 1100  BP: 101/61 (!) 77/49  Pulse: 85   Resp:    Temp: 98 F (36.7 C) 98.2 F (36.8 C)  SpO2:

## 2017-10-12 NOTE — Progress Notes (Signed)
BP 71/44 patient drowsy and arouses to touch but falls back asleep fast. States that she feels slightly dizzy. Temp 100.0 dr. Darvin Neighbours notified. Orders received for a liter bolus given. Will proceed and continue to monitor.

## 2017-10-13 DIAGNOSIS — E43 Unspecified severe protein-calorie malnutrition: Secondary | ICD-10-CM

## 2017-10-13 DIAGNOSIS — C50919 Malignant neoplasm of unspecified site of unspecified female breast: Secondary | ICD-10-CM

## 2017-10-13 LAB — CBC WITH DIFFERENTIAL/PLATELET
BASOS ABS: 0 10*3/uL (ref 0–0.1)
Basophils Relative: 0 %
Eosinophils Absolute: 0.2 10*3/uL (ref 0–0.7)
Eosinophils Relative: 2 %
HCT: 24.4 % — ABNORMAL LOW (ref 35.0–47.0)
HEMOGLOBIN: 8 g/dL — AB (ref 12.0–16.0)
LYMPHS ABS: 0.7 10*3/uL — AB (ref 1.0–3.6)
LYMPHS PCT: 7 %
MCH: 27.8 pg (ref 26.0–34.0)
MCHC: 32.6 g/dL (ref 32.0–36.0)
MCV: 85.2 fL (ref 80.0–100.0)
Monocytes Absolute: 1.2 10*3/uL — ABNORMAL HIGH (ref 0.2–0.9)
Monocytes Relative: 10 %
NEUTROS ABS: 9.2 10*3/uL — AB (ref 1.4–6.5)
NEUTROS PCT: 81 %
PLATELETS: 346 10*3/uL (ref 150–440)
RBC: 2.87 MIL/uL — AB (ref 3.80–5.20)
RDW: 16.6 % — ABNORMAL HIGH (ref 11.5–14.5)
WBC: 11.3 10*3/uL — AB (ref 3.6–11.0)

## 2017-10-13 LAB — BASIC METABOLIC PANEL
ANION GAP: 3 — AB (ref 5–15)
BUN: 6 mg/dL (ref 6–20)
CHLORIDE: 109 mmol/L (ref 101–111)
CO2: 26 mmol/L (ref 22–32)
Calcium: 7.4 mg/dL — ABNORMAL LOW (ref 8.9–10.3)
Creatinine, Ser: 0.35 mg/dL — ABNORMAL LOW (ref 0.44–1.00)
GFR calc Af Amer: >60 mL/min (ref >60)
GLUCOSE: 103 mg/dL — AB (ref 65–99)
POTASSIUM: 3.4 mmol/L — AB (ref 3.5–5.1)
SODIUM: 138 mmol/L (ref 135–145)

## 2017-10-13 LAB — URINE CULTURE: Culture: NO GROWTH

## 2017-10-13 LAB — POTASSIUM: Potassium: 3.6 mmol/L (ref 3.5–5.1)

## 2017-10-13 LAB — EXPECTORATED SPUTUM ASSESSMENT W GRAM STAIN, RFLX TO RESP C

## 2017-10-13 LAB — GLUCOSE, CAPILLARY: GLUCOSE-CAPILLARY: 104 mg/dL — AB (ref 65–99)

## 2017-10-13 LAB — TROPONIN I: Troponin I: 0.39 ng/mL (ref <0.03)

## 2017-10-13 LAB — MAGNESIUM: MAGNESIUM: 2.7 mg/dL — AB (ref 1.7–2.4)

## 2017-10-13 LAB — PHOSPHORUS: Phosphorus: 3.5 mg/dL (ref 2.5–4.6)

## 2017-10-13 LAB — EXPECTORATED SPUTUM ASSESSMENT W REFEX TO RESP CULTURE

## 2017-10-13 MED ORDER — LORATADINE 10 MG PO TABS
10.0000 mg | ORAL_TABLET | Freq: Every day | ORAL | Status: DC
  Administered 2017-10-14 – 2017-10-17 (×4): 10 mg via ORAL
  Filled 2017-10-13 (×4): qty 1

## 2017-10-13 MED ORDER — ADULT MULTIVITAMIN W/MINERALS CH
1.0000 | ORAL_TABLET | Freq: Every day | ORAL | Status: DC
  Administered 2017-10-13 – 2017-10-17 (×5): 1 via ORAL
  Filled 2017-10-13 (×5): qty 1

## 2017-10-13 MED ORDER — ENSURE ENLIVE PO LIQD
237.0000 mL | Freq: Three times a day (TID) | ORAL | Status: DC
  Administered 2017-10-13 – 2017-10-17 (×4): 237 mL via ORAL

## 2017-10-13 MED ORDER — POTASSIUM CHLORIDE 10 MEQ/100ML IV SOLN
10.0000 meq | INTRAVENOUS | Status: AC
  Administered 2017-10-13 (×3): 10 meq via INTRAVENOUS
  Filled 2017-10-13 (×3): qty 100

## 2017-10-13 MED ORDER — SODIUM CHLORIDE 0.9 % IV SOLN
INTRAVENOUS | Status: DC
  Administered 2017-10-13 (×2): via INTRAVENOUS

## 2017-10-13 MED ORDER — MAGNESIUM SULFATE 4 GM/100ML IV SOLN
4.0000 g | Freq: Once | INTRAVENOUS | Status: AC
  Administered 2017-10-13: 4 g via INTRAVENOUS
  Filled 2017-10-13: qty 100

## 2017-10-13 MED ORDER — SODIUM CHLORIDE 0.9 % IV BOLUS (SEPSIS)
500.0000 mL | INTRAVENOUS | Status: AC
  Administered 2017-10-13 (×2): 500 mL via INTRAVENOUS

## 2017-10-13 MED ORDER — VITAMIN B-12 1000 MCG PO TABS
500.0000 ug | ORAL_TABLET | Freq: Every day | ORAL | Status: DC
  Administered 2017-10-13 – 2017-10-17 (×5): 500 ug via ORAL
  Filled 2017-10-13 (×5): qty 1

## 2017-10-13 MED ORDER — POTASSIUM CHLORIDE 10 MEQ/100ML IV SOLN
10.0000 meq | INTRAVENOUS | Status: DC
  Filled 2017-10-13 (×4): qty 100

## 2017-10-13 MED ORDER — VANCOMYCIN HCL IN DEXTROSE 750-5 MG/150ML-% IV SOLN
750.0000 mg | INTRAVENOUS | Status: DC
  Administered 2017-10-13 – 2017-10-14 (×2): 750 mg via INTRAVENOUS
  Filled 2017-10-13 (×2): qty 150

## 2017-10-13 MED ORDER — DEXTROSE 5 % IV SOLN
30.0000 mmol | Freq: Once | INTRAVENOUS | Status: AC
  Administered 2017-10-13: 30 mmol via INTRAVENOUS
  Filled 2017-10-13: qty 10

## 2017-10-13 MED ORDER — CALCIUM CARBONATE-VITAMIN D 500-200 MG-UNIT PO TABS
1.0000 | ORAL_TABLET | Freq: Three times a day (TID) | ORAL | Status: DC
  Administered 2017-10-13 – 2017-10-17 (×12): 1 via ORAL
  Filled 2017-10-13 (×13): qty 1

## 2017-10-13 MED ORDER — VITAMIN B-1 100 MG PO TABS
100.0000 mg | ORAL_TABLET | Freq: Every day | ORAL | Status: DC
  Administered 2017-10-13 – 2017-10-17 (×5): 100 mg via ORAL
  Filled 2017-10-13 (×5): qty 1

## 2017-10-13 MED ORDER — SODIUM CHLORIDE 0.9 % IV SOLN
2.0000 g | Freq: Two times a day (BID) | INTRAVENOUS | Status: DC
  Administered 2017-10-13 – 2017-10-14 (×2): 2 g via INTRAVENOUS
  Filled 2017-10-13 (×3): qty 2

## 2017-10-13 MED ORDER — MAGNESIUM SULFATE 2 GM/50ML IV SOLN
2.0000 g | Freq: Once | INTRAVENOUS | Status: AC
  Administered 2017-10-13: 2 g via INTRAVENOUS
  Filled 2017-10-13: qty 50

## 2017-10-13 NOTE — Progress Notes (Signed)
[facility] NAME: Sophia Nelson    MR#:  [id number]  DATE OF BIRTH:  10/02/1945  SUBJECTIVE:  CHIEF COMPLAINT:   Chief Complaint  Patient presents with  . Code Sepsis   Admitted for sepsis.  Blood cultures returned with Streptococcus species.  Transferred to ICU on 10/12/2017.  Blood pressure continues to be low at 77/59 today. Continues to be drowsy today.  REVIEW OF SYSTEMS:    Review of Systems  Unable to perform ROS: Mental status change    DRUG ALLERGIES:   Allergies  Allergen Reactions  . Other Other (See Comments)    Onions(that grow in the yard-pt can eat onions without problems) and dust mite Sneezing, cough, runny nose    VITALS:  Blood pressure 120/79, pulse 93, temperature 98.6 F (37 C), temperature source Oral, resp. rate 20, height 5\' 2"  (1.575 m), weight 51.1 kg (112 lb 10.5 oz), SpO2 94 %.  PHYSICAL EXAMINATION:   Physical Exam  GENERAL:  72 y.o.-year-old patient lying in the bed EYES: Pupils equal, round, reactive to light and accommodation.  HEENT: Head atraumatic, normocephalic. Oropharynx and nasopharynx clear.  NECK:  Supple, no jugular venous distention. No thyroid enlargement, no tenderness.  LUNGS: Bibasilar crackles CARDIOVASCULAR: S1, S2.  Tachycardic ABDOMEN: Soft, nontender, nondistended. Bowel sounds present. No organomegaly or mass.  EXTREMITIES: No cyanosis, clubbing or edema b/l.    NEUROLOGIC: Cranial nerves II through XII are intact. No focal Motor or sensory deficits b/l.   PSYCHIATRIC: The patient is drowsy  LABORATORY PANEL:   CBC Recent Labs  Lab 10/13/17 0446  WBC 11.3*  HGB 8.0*  HCT 24.4*  PLT 346   ------------------------------------------------------------------------------------------------------------------ Chemistries  Recent Labs  Lab 10/11/17 1522  10/13/17 0446 10/13/17 1007  NA 140   < > 138  --   K 2.9*   < > 3.4* 3.6  CL 98*   < > 109  --   CO2  29   < > 26  --   GLUCOSE 116*   < > 103*  --   BUN <5*   < > 6  --   CREATININE 0.45   < > 0.35*  --   CALCIUM 9.1   < > 7.4*  --   MG  --    < > 2.7*  --   AST 224*  --   --   --   ALT 44  --   --   --   ALKPHOS 390*  --   --   --   BILITOT 1.9*  --   --   --    < > = values in this interval not displayed.   ------------------------------------------------------------------------------------------------------------------  Cardiac Enzymes Recent Labs  Lab 10/13/17 0446  TROPONINI 0.39*   ------------------------------------------------------------------------------------------------------------------  RADIOLOGY:  Ct Head Wo Contrast  Result Date: 10/12/2017 CLINICAL DATA:  Altered level of consciousness. EXAM: CT HEAD WITHOUT CONTRAST TECHNIQUE: Contiguous axial images were obtained from the base of the skull through the vertex without intravenous contrast. COMPARISON:  06/09/2017 FINDINGS: Brain: There is no evidence for acute hemorrhage, hydrocephalus, mass lesion, or abnormal extra-axial fluid collection. No definite CT evidence for acute infarction. Vascular: No hyperdense vessel or unexpected calcification. Skull: No evidence for fracture. No worrisome lytic or sclerotic lesion. Sinuses/Orbits: The visualized paranasal sinuses and mastoid air cells are clear. Visualized portions of the globes and intraorbital fat are unremarkable. Other: None. IMPRESSION: Stable. Negative head CT without  new or acute intracranial abnormality. Electronically Signed   By: Misty Stanley M.D.   On: 10/12/2017 12:20   Ct Abdomen Pelvis W Contrast  Result Date: 10/11/2017 CLINICAL DATA:  Metastatic breast cancer with abdominal and small bowel involvement. Status post Roux-en-Y for bowel obstruction EXAM: CT ABDOMEN AND PELVIS WITH CONTRAST TECHNIQUE: Multidetector CT imaging of the abdomen and pelvis was performed using the standard protocol following bolus administration of intravenous contrast.  CONTRAST:  16mL ISOVUE-300 IOPAMIDOL (ISOVUE-300) INJECTION 61% COMPARISON:  09/22/2017 FINDINGS: Lower chest: Unremarkable. Hepatobiliary: No focal abnormality in the liver parenchyma. There is no evidence for gallstones, gallbladder wall thickening, or pericholecystic fluid. No intrahepatic or extrahepatic biliary dilation. Pancreas: No focal mass lesion. No dilatation of the main duct. No intraparenchymal cyst. No peripancreatic edema. Spleen: No splenomegaly. No focal mass lesion. Adrenals/Urinary Tract: No adrenal nodule or mass. Similar appearance mild right hydronephrosis mild fullness left intrarenal collecting system is stable. Circumferential bladder wall thickening is slightly more prominent than before. Stomach/Bowel: Status post gastric bypass. There is edema in the retroperitoneal tissues around the transverse duodenum and duodenal bulb appears distended. These features are similar to prior study. No overt small bowel obstruction. Terminal ileum shows hyperenhancement. Focus of hyperenhancement identified in small bowel anterior left abdomen (image 46 series 2). As on the prior study, there is wall thickening and hyperenhancement in the ascending colon. Colon is diffusely decompressed. Vascular/Lymphatic: There is abdominal aortic atherosclerosis without aneurysm. There is no gastrohepatic or hepatoduodenal ligament lymphadenopathy. No intraperitoneal or retroperitoneal lymphadenopathy. No pelvic sidewall lymphadenopathy. Reproductive: Uterus surgically absent. Other: Ill-defined heterogeneously enhancing lesion is identified in the right posterior pelvis (image 50 series 2) measuring 3.8 x 4.4 cm. The ureter can be seen passing posterior to this suggesting that it is along the posterior peritoneal surface. Metastatic lesions suspected. Hematoma or abscess considered less likely but not excluded. Musculoskeletal: Bone windows reveal no worrisome lytic or sclerotic osseous lesions. IMPRESSION: 1.  Interval development 3.8 x 4.4 cm heterogeneously enhancing lesion posterior right pelvis suspicious for metastatic involvement. This does have some low attenuation and hematoma or abscess would be considered less likely possibilities. Close follow-up recommended. 2. As on prior study, there are multiple areas of abnormal enhancement in the bowel of the left abdomen. Abnormal wall thickening and enhancement is seen in the terminal ileum and right colon as well. Imaging features suggest metastatic involvement. 3. Mild duodenal distention with edema in the retroperitoneal tissues around the duodenum. 4. Small volume intraperitoneal free fluid 5. Similar appearance of right greater than left hydronephrosis. Electronically Signed   By: Misty Stanley M.D.   On: 10/11/2017 17:28   Dg Chest Port 1 View  Result Date: 10/11/2017 CLINICAL DATA:  Shortness of breath with fever and chills. EXAM: PORTABLE CHEST 1 VIEW COMPARISON:  09/21/2017 FINDINGS: 1535 hours. Lungs are hyperexpanded. The lungs are clear without focal pneumonia, edema, pneumothorax or pleural effusion. The cardiopericardial silhouette is within normal limits for size. Right Port-A-Cath is stable. The visualized bony structures of the thorax are intact. IMPRESSION: Stable.  No acute findings. Electronically Signed   By: Misty Stanley M.D.   On: 10/11/2017 15:56     ASSESSMENT AND PLAN:   72 year old female with past medical history of breast cancer currently undergoing chemotherapy, hypertension, diabetes, COPD, previous history of pancreatitis, chronic back pain, previous history of gastric outlet obstruction who presents to the hospital due to fever, hypotension weakness lethargy and malaise.  * Septic shock secondary to Streptococcus bacteremia.  Etiology could be bowel with her abdominal mets around the bowel. Will have to wait for final ID and sensitivities. On IV cefepime and vancomycin. Blood pressure continues to be low today.   *  Acute toxic metabolic encephalopathy. Improving  *Elevated troponin likely due to demand ischemia.  No significant elevation on repeat troponin check.  * Metastatic breast cancer.  Follows with cancer center as outpatient.  Discussed with Dr. Janese Banks. Appreciate seeing patient in the hospital  * Anemia of chronic disease is stable  *DVT prophylaxis with Lovenox  All the records are reviewed and case discussed with Care Management/Social Worker Management plans discussed with the patient, family and they are in agreement.  CODE STATUS: FULL CODE  DVT Prophylaxis: SCDs  TOTAL CRITICAL CARE TIME TAKING CARE OF THIS PATIENT: 40 minutes.   Neita Carp M.D on 10/13/2017 at 10:44 AM  Between 7am to 6pm - Pager - [phone]  After 6pm go to [email or web address] - password [email or web address]  Office  [phone]  CC: Primary care physician; Donnie Coffin, MD  Note: This dictation was prepared with Dragon dictation along with smaller phrase technology. Any transcriptional errors that result from this process are unintentional.

## 2017-10-13 NOTE — Progress Notes (Signed)
Pharmacy Antibiotic Note  Sophia Nelson is a 72 y.o. female admitted on 10/11/2017 with sepsis.  Pharmacy has been consulted for vancomycin + cefepime dosing.  Plan: Cefepime 2 g IV q12h  Patient was started on vancomycin 750 mg IV q36h, However given renal function and indication for sepsis/bacteremia will increase dose to 750 mg IV q18h VT goal 15-20 mcg/mL  Height: 5\' 2"  (157.5 cm) Weight: 112 lb 10.5 oz (51.1 kg) IBW/kg (Calculated) : 50.1  Temp (24hrs), Avg:98.8 F (37.1 C), Min:98.4 F (36.9 C), Max:99.2 F (37.3 C)  Recent Labs  Lab 10/11/17 1418 10/11/17 1522 10/11/17 1828 10/12/17 0633 10/12/17 2248 10/13/17 0446  WBC 17.4* 16.0*  --  23.1*  --  11.3*  CREATININE 0.38* 0.45  --  0.50 0.37* 0.35*  LATICACIDVEN  --  1.5 1.3  --  0.6  --     Estimated Creatinine Clearance: 52.5 mL/min (A) (by C-G formula based on SCr of 0.35 mg/dL (L)).    Allergies  Allergen Reactions  . Other Other (See Comments)    Onions(that grow in the yard-pt can eat onions without problems) and dust mite Sneezing, cough, runny nose    Thank you for allowing pharmacy to be a part of this patient's care.  Darylene Price Sparrow [place] 10/13/2017 3:30 PM

## 2017-10-13 NOTE — Progress Notes (Signed)
Tukov NP notified of critical magnesium of 0.9. NP will place orders to replace.

## 2017-10-13 NOTE — Progress Notes (Signed)
PULMONARY / CRITICAL CARE MEDICINE   Name: Sophia Nelson MRN: [id number] DOB: [birth year]    ADMISSION DATE:  10/11/2017  HISTORY OF PRESENT ILLNESS:   The patient with past medical history of metastatic breast cancer, COPD and diabetes resents to the emergency department with shortness of breath. The patient states that she has had a nonproductive cough and all of a sudden was unable to breathe this evening. She reports that she gets pneumonia every year and that she thinks this may be similar. She is chronically on 4 L of oxygen at home. She received multiple breathing treatments in the emergency department to improve her respiratory status after which the emergency department staff called the hospitalist service for other management. Patient was transferred to the ICU for management of Streptococcus bacteremia.  REVIEW OF SYSTEMS:   NAD  SUBJECTIVE:  Patient reports that she feels better ut she has been very sleepy.  VITAL SIGNS: BP (!) 100/58   Pulse 87   Temp 98.4 F (36.9 C) (Oral)   Resp 17   Ht 5\' 2"  (1.575 m)   Wt 112 lb 10.5 oz (51.1 kg)   SpO2 100%   BMI 20.60 kg/m   HEMODYNAMICS:  no compromise  Oxygen: 2 Liters [place]    INTAKE / OUTPUT: I/O last 3 completed shifts: In: 5693.3 [I.V.:2750; IV Piggyback:2943.3] Out: 780 [Urine:780]  PHYSICAL EXAMINATION: General:  Very sleepy today but no distress Neuro:  When awake alert and appropriate, no deficts HEENT:  PERRL Cardiovascular:  RRR. No oedema Lungs:  CTA Abdomen:  Soft, non tender +BS Musculoskeletal:  No deformities Skin:  Warm and dry  LABS:  BMET Recent Labs  Lab 10/12/17 0633 10/12/17 2248 10/13/17 0446 10/13/17 1007  NA 138 139 138  --   K 2.9* 3.0* 3.4* 3.6  CL 102 108 109  --   CO2 24 26 26   --   BUN 6 8 6   --   CREATININE 0.50 0.37* 0.35*  --   GLUCOSE 77 94 103*  --     Electrolytes Recent Labs  Lab 10/12/17 0633 10/12/17 2248 10/13/17 0446  CALCIUM 7.8* 7.6* 7.4*  MG   --  0.9* 2.7*  PHOS  --  2.2* 3.5    CBC Recent Labs  Lab 10/11/17 1522 10/12/17 0633 10/13/17 0446  WBC 16.0* 23.1* 11.3*  HGB 10.8* 8.4* 8.0*  HCT 32.8* 26.2* 24.4*  PLT 482* 380 346    Coag's Recent Labs  Lab 10/11/17 1522  INR 0.96    Sepsis Markers Recent Labs  Lab 10/11/17 1418 10/11/17 1522 10/11/17 1828 10/12/17 2248  LATICACIDVEN  --  1.5 1.3 0.6  PROCALCITON 1.45  --   --   --     ABG No results for input(s): PHART, PCO2ART, PO2ART in the last 168 hours.  Liver Enzymes Recent Labs  Lab 10/11/17 1522  AST 224*  ALT 44  ALKPHOS 390*  BILITOT 1.9*  ALBUMIN 2.7*    Cardiac Enzymes Recent Labs  Lab 10/12/17 0633 10/12/17 2248 10/13/17 0446  TROPONINI 0.44* 0.37* 0.39*    Glucose Recent Labs  Lab 10/12/17 1139 10/13/17 0728  GLUCAP 84 104*    Imaging No results found.   STUDIES:  2/16  CT Head- negative  CULTURES: 2/15 Blood- Streptococcus species  ANTIBIOTICS: 2/17- Cefepime, Vancomycin  SIGNIFICANT EVENTS: 2/17 admitted to hospital; transferred to ICU  LINES/TUBES: Porta cath  DISCUSSION: This 72 year old lady with recently diagnosed metastatic Breast cancer and  yet to start treatment was admitted with sepsis from Streptococcal bacteremia.  She was treated for E.Coli bacteremia last month. She was transferred to the ICU for possible vasopressor support however, she has improved with volume support and ABX.  ASSESSMENT / PLAN:  1. Sepsis with shock has resolved. BP has improved. 2. Streptococcus species bacteremia- ID and sensi still pending. 3. Acute Metabolic encephalopathy has resolved 4. Hypokalemia has been replaced 5. Critical illness anemia- close monitor Hg whilst on Lovenox as patient has hx of occult positive stool 6. Hypovolemia has resolved 7. Demand ischemia- trop has trended down 8. Severe malnutrition 9. Hx of Metastatic Breast cancer not yet been treated  Plan 1. Continue ABX pending ID and  Sensi 2. Seen by dietitian-nutritional support for protein malnutrition 3. Incentive spirometry 4. Continue IVF 5. DVT prophylaxis 6. Monitor Hg and transfuse for < 7 7. Out of bed    FAMILY  - Updates: given to son   [name], M.D Pulmonary and [place] Pager: [phone]  10/13/2017, 3:40 PM

## 2017-10-13 NOTE — Progress Notes (Signed)
Patient's blood pressure more stable this shift. Patient continued on chronic 2 L nasal cannula. Patient more steady on her feet when up to [facility]. Patient slept between care but when woken up was alert and oriented and had no trouble staying awake to eat, take pills, and finish conversations with RN.

## 2017-10-13 NOTE — Progress Notes (Signed)
Initial Nutrition Assessment  DOCUMENTATION CODES:   Severe malnutrition in context of chronic illness  INTERVENTION:  Provide Ensure Enlive po TID, each supplement provides 350 kcal and 20 grams of protein. Patient prefers strawberry.  Provide multivitamin with minerals daily PO, Oscal with D 1 tablet TID PO, vitamin B12 500 micrograms daily PO, thiamine 100 mg daily PO.  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   Met with patient at bedside. She reports her appetite has been "alright" lately. She is eating 2-4 small meals daily at home. She may have baked tilapia, baked chicken, broccoli, crab legs, eggs, or bacon. Family and friends are helping with meal preparation. She drinks 2-3 bottles daily of Ensure Plus or milk. She has nausea with greasy/fatty foods so she has been avoiding those. She is not taking her multivitamin/mineral supplements anymore because she reports she already has too many pills to take.  UBW was 178 lbs. Patient was 166.6 lbs on 11/12/2016, 145.8 lbs 03/20/2017, 137.6 lbs 06/09/2017, and she is now  down to 112.7 lbs. She has lost 53.9 lbs (32.4% body weight) over the past year, which is significant for time frame.  Medications reviewed and include: Colace, pantoprazole, NS @ 100 mL/hr, cefepime, vancomycin.  Labs reviewed: CBG 104, Potassium 3.4, Creatinine 0.35, Phosphorus 3.5 (WNL) today after supplementation, Magnesium 2.7 (was 0.9 yesterday).  Discussed with RN and MD. Faythe Ghee to start vitamin/mineral supplementation for patient. MD also recommends thiamine PO daily.  NUTRITION - FOCUSED PHYSICAL EXAM:    Most Recent Value  Orbital Region  Severe depletion  Upper Arm Region  Severe depletion  Thoracic and Lumbar Region  Moderate depletion  Buccal Region  Severe depletion  Temple Region  Severe depletion  Clavicle Bone Region  Severe depletion  Clavicle and Acromion Bone Region  Severe depletion  Scapular Bone Region  Severe depletion  Dorsal Hand  Moderate depletion  Patellar Region  Moderate depletion  Anterior Thigh Region  Moderate depletion  Posterior Calf Region  Moderate depletion  Edema (RD Assessment)  Mild  Hair  Reviewed  Eyes  Reviewed  Mouth  Reviewed  Skin  Reviewed  Nails  Reviewed     Diet Order:  Diet regular Room service appropriate? Yes; Fluid consistency: Thin  EDUCATION NEEDS:   Education needs have been addressed  Skin:  Skin Assessment: Reviewed RN Assessment  Last BM:  10/11/2017  Height:   Ht Readings from Last 1 Encounters:  10/12/17 5' 2"  (1.575 m)    Weight:   Wt Readings from Last 1 Encounters:  10/12/17 112 lb 10.5 oz (51.1 kg)    Ideal Body Weight:  50 kg  BMI:  Body mass index is 20.6 kg/m.  Estimated Nutritional Needs:   Kcal:  1530-1780 (30-35 kcal/kg)  Protein:  75-90 grams (1.5-1.8 grams/kg)  Fluid:  1.5-1.8 L/day (30-35 mL/kg)  Willey Blade, MS, RD, LDN Office: [phone] Pager: [phone] After Hours/Weekend Pager: [phone]

## 2017-10-14 ENCOUNTER — Telehealth: Payer: Self-pay | Admitting: Internal Medicine

## 2017-10-14 DIAGNOSIS — A419 Sepsis, unspecified organism: Secondary | ICD-10-CM

## 2017-10-14 DIAGNOSIS — C50812 Malignant neoplasm of overlapping sites of left female breast: Secondary | ICD-10-CM

## 2017-10-14 DIAGNOSIS — C7880 Secondary malignant neoplasm of unspecified digestive organ: Secondary | ICD-10-CM

## 2017-10-14 DIAGNOSIS — J9621 Acute and chronic respiratory failure with hypoxia: Secondary | ICD-10-CM

## 2017-10-14 LAB — CBC WITH DIFFERENTIAL/PLATELET
Basophils Absolute: 0 10*3/uL (ref 0–0.1)
Basophils Relative: 0 %
EOS ABS: 0.4 10*3/uL (ref 0–0.7)
Eosinophils Relative: 4 %
HCT: 25.4 % — ABNORMAL LOW (ref 35.0–47.0)
HEMOGLOBIN: 8.4 g/dL — AB (ref 12.0–16.0)
Lymphocytes Relative: 10 %
Lymphs Abs: 1 10*3/uL (ref 1.0–3.6)
MCH: 28.2 pg (ref 26.0–34.0)
MCHC: 33 g/dL (ref 32.0–36.0)
MCV: 85.7 fL (ref 80.0–100.0)
Monocytes Absolute: 1.5 10*3/uL — ABNORMAL HIGH (ref 0.2–0.9)
Monocytes Relative: 15 %
NEUTROS ABS: 7.3 10*3/uL — AB (ref 1.4–6.5)
NEUTROS PCT: 71 %
Platelets: 323 10*3/uL (ref 150–440)
RBC: 2.96 MIL/uL — AB (ref 3.80–5.20)
RDW: 17.1 % — ABNORMAL HIGH (ref 11.5–14.5)
WBC: 10.2 10*3/uL (ref 3.6–11.0)

## 2017-10-14 LAB — BASIC METABOLIC PANEL
Anion gap: 7 (ref 5–15)
BUN: 6 mg/dL (ref 6–20)
CHLORIDE: 110 mmol/L (ref 101–111)
CO2: 24 mmol/L (ref 22–32)
Calcium: 7.8 mg/dL — ABNORMAL LOW (ref 8.9–10.3)
Creatinine, Ser: 0.38 mg/dL — ABNORMAL LOW (ref 0.44–1.00)
GFR calc non Af Amer: >60 mL/min (ref >60)
Glucose, Bld: 99 mg/dL (ref 65–99)
POTASSIUM: 3 mmol/L — AB (ref 3.5–5.1)
SODIUM: 141 mmol/L (ref 135–145)

## 2017-10-14 LAB — CULTURE, BLOOD (ROUTINE X 2): Special Requests: ADEQUATE

## 2017-10-14 MED ORDER — CEFAZOLIN SODIUM-DEXTROSE 2-4 GM/100ML-% IV SOLN
2.0000 g | Freq: Three times a day (TID) | INTRAVENOUS | Status: DC
  Filled 2017-10-14 (×3): qty 100

## 2017-10-14 MED ORDER — PANTOPRAZOLE SODIUM 40 MG PO PACK
20.0000 mg | PACK | Freq: Every day | ORAL | Status: DC
  Administered 2017-10-15 – 2017-10-17 (×3): 20 mg via ORAL
  Filled 2017-10-14 (×4): qty 20

## 2017-10-14 MED ORDER — POTASSIUM CHLORIDE 10 MEQ/50ML IV SOLN
10.0000 meq | INTRAVENOUS | Status: DC
  Administered 2017-10-14 (×6): 10 meq via INTRAVENOUS
  Filled 2017-10-14 (×6): qty 50

## 2017-10-14 MED ORDER — TIOTROPIUM BROMIDE MONOHYDRATE 18 MCG IN CAPS
18.0000 ug | ORAL_CAPSULE | Freq: Every day | RESPIRATORY_TRACT | Status: DC
  Administered 2017-10-14 – 2017-10-17 (×4): 18 ug via RESPIRATORY_TRACT
  Filled 2017-10-14 (×2): qty 5

## 2017-10-14 MED ORDER — SODIUM CHLORIDE 0.9 % IV SOLN
2.0000 g | INTRAVENOUS | Status: DC
  Administered 2017-10-14 – 2017-10-17 (×3): 2 g via INTRAVENOUS
  Filled 2017-10-14 (×5): qty 20

## 2017-10-14 NOTE — Progress Notes (Signed)
[facility] Progress Note Patient Name: Sophia Nelson DOB: Nov 06, 1945 MRN: [id number]   Date of Service  10/14/2017  HPI/Events of Note  K+ = 3.0 and Creatinine = 0.38.   eICU Interventions  Will replace K+.      Intervention Category Major Interventions: Electrolyte abnormality - evaluation and management  [name] 10/14/2017, 6:13 AM

## 2017-10-14 NOTE — Progress Notes (Signed)
No new complaints Cognition intact Denies dyspnea and pain  Vitals:   10/14/17 1100 10/14/17 1200 10/14/17 1300 10/14/17 1400  BP: 113/74 98/70 118/66 127/75  Pulse: 83 81 80 84  Resp: 20 17 17 14   Temp:      TempSrc:      SpO2: 100% 100% 100% 100%  Weight:      Height:         Gen: Chronically ill appearing in NAD, intermittent rattling cough HEENT: Alopecia, NCAT, sclerae white, oropharynx normal Neck: No LAN, no JVD noted Lungs: Markedly diminished BS with scattered rhonchi, no wheezes Cardiovascular: Regular, no M noted Abdomen: Soft, NT, +BS Ext: no C/C/E Neuro: No focal deficits  BMP Latest Ref Rng & Units 10/14/2017 10/13/2017 10/13/2017  Glucose 65 - 99 mg/dL 99 - 103(H)  BUN 6 - 20 mg/dL 6 - 6  Creatinine 0.44 - 1.00 mg/dL 0.38(L) - 0.35(L)  Sodium 135 - 145 mmol/L 141 - 138  Potassium 3.5 - 5.1 mmol/L 3.0(L) 3.6 3.4(L)  Chloride 101 - 111 mmol/L 110 - 109  CO2 22 - 32 mmol/L 24 - 26  Calcium 8.9 - 10.3 mg/dL 7.8(L) - 7.4(L)    CBC Latest Ref Rng & Units 10/14/2017 10/13/2017 10/12/2017  WBC 3.6 - 11.0 K/uL 10.2 11.3(H) 23.1(H)  Hemoglobin 12.0 - 16.0 g/dL 8.4(L) 8.0(L) 8.4(L)  Hematocrit 35.0 - 47.0 % 25.4(L) 24.4(L) 26.2(L)  Platelets 150 - 440 K/uL 323 346 380    Results for orders placed or performed during the hospital encounter of 10/11/17  Urine culture     Status: None   Collection Time: 10/11/17  3:22 PM  Result Value Ref Range Status   Specimen Description   Final    URINE, RANDOM Performed at [facility], [address] [place], [place] [address]    Special Requests   Final    NONE Performed at [facility], [address], [place], [place] [address]    Culture   Final    NO GROWTH Performed at [facility], [place] [address] [place], [place] [address]    Report Status 10/13/2017 FINAL  Final  Culture, blood (Routine x 2)     Status: Abnormal   Collection Time: 10/11/17  3:23 PM  Result Value Ref Range Status   Specimen Description   Final    BLOOD BLOOD LEFT FOREARM Performed at [facility], [address] [place], [place] [address]    Special Requests   Final    BOTTLES DRAWN AEROBIC AND ANAEROBIC Blood Culture adequate volume Performed at [facility], [address], [place], [place] [address]    Culture  Setup Time   Final    GRAM POSITIVE COCCI IN BOTH AEROBIC AND ANAEROBIC BOTTLES CRITICAL RESULT CALLED TO, READ BACK BY AND VERIFIED WITH: DAVID BESANTI AT 4235 10/12/17.PMH Performed at [facility], [address] [place], [place] [address]    Culture (A)  Final    STREPTOCOCCUS GALLOLYTICUS SUSCEPTIBILITIES PERFORMED ON PREVIOUS CULTURE WITHIN THE LAST 5 DAYS. Performed at [facility], [place] [address] [place], [place] [address]    Report Status 10/14/2017 FINAL  Final  Culture, blood (Routine x 2)     Status: Abnormal   Collection Time: 10/11/17  3:23 PM  Result Value Ref Range Status   Specimen Description   Final    BLOOD PORTA CATH Performed at [facility], [address] [place], [place] [address]  Special Requests   Final    BOTTLES DRAWN AEROBIC AND ANAEROBIC Blood Culture results may not be optimal due to an excessive volume of blood received in culture bottles Performed at [facility], [address] [place], [place] [address]    Culture  Setup Time   Final    GRAM POSITIVE COCCI IN BOTH AEROBIC AND ANAEROBIC BOTTLES CRITICAL RESULT CALLED TO, READ BACK BY AND VERIFIED WITH: DAVID BESANTI AT 6045 10/12/17.PMH Performed at [facility], [place] [address], [place], [place] [address]    Culture STREPTOCOCCUS GALLOLYTICUS (A)  Final   Report Status 10/14/2017 FINAL  Final   Organism ID, Bacteria STREPTOCOCCUS GALLOLYTICUS  Final      Susceptibility   Streptococcus gallolyticus - MIC*    PENICILLIN 0.12 SENSITIVE Sensitive     CEFTRIAXONE 0.25 SENSITIVE Sensitive     ERYTHROMYCIN <=0.12 SENSITIVE  Sensitive     LEVOFLOXACIN 2 SENSITIVE Sensitive     VANCOMYCIN 0.25 SENSITIVE Sensitive     * STREPTOCOCCUS GALLOLYTICUS  Blood Culture ID Panel (Reflexed)     Status: Abnormal   Collection Time: 10/11/17  3:23 PM  Result Value Ref Range Status   Enterococcus species NOT DETECTED NOT DETECTED Final   Listeria monocytogenes NOT DETECTED NOT DETECTED Final   Staphylococcus species NOT DETECTED NOT DETECTED Final   Staphylococcus aureus NOT DETECTED NOT DETECTED Final   Streptococcus species DETECTED (A) NOT DETECTED Final    Comment: Not Enterococcus species, Streptococcus agalactiae, Streptococcus pyogenes, or Streptococcus pneumoniae. CRITICAL RESULT CALLED TO, READ BACK BY AND VERIFIED WITH: DAVID BESANTI AT 1914 10/12/17.PMH    Streptococcus agalactiae NOT DETECTED NOT DETECTED Final   Streptococcus pneumoniae NOT DETECTED NOT DETECTED Final   Streptococcus pyogenes NOT DETECTED NOT DETECTED Final   Acinetobacter baumannii NOT DETECTED NOT DETECTED Final   Enterobacteriaceae species NOT DETECTED NOT DETECTED Final   Enterobacter cloacae complex NOT DETECTED NOT DETECTED Final   Escherichia coli NOT DETECTED NOT DETECTED Final   Klebsiella oxytoca NOT DETECTED NOT DETECTED Final   Klebsiella pneumoniae NOT DETECTED NOT DETECTED Final   Proteus species NOT DETECTED NOT DETECTED Final   Serratia marcescens NOT DETECTED NOT DETECTED Final   Haemophilus influenzae NOT DETECTED NOT DETECTED Final   Neisseria meningitidis NOT DETECTED NOT DETECTED Final   Pseudomonas aeruginosa NOT DETECTED NOT DETECTED Final   Candida albicans NOT DETECTED NOT DETECTED Final   Candida glabrata NOT DETECTED NOT DETECTED Final   Candida krusei NOT DETECTED NOT DETECTED Final   Candida parapsilosis NOT DETECTED NOT DETECTED Final   Candida tropicalis NOT DETECTED NOT DETECTED Final    Comment: Performed at [facility], [address] [place], [place] [address]  MRSA PCR Screening     Status:  None   Collection Time: 10/12/17  2:23 PM  Result Value Ref Range Status   MRSA by PCR NEGATIVE NEGATIVE Final    Comment:        The GeneXpert MRSA Assay (FDA approved for NASAL specimens only), is one component of a comprehensive MRSA colonization surveillance program. It is not intended to diagnose MRSA infection nor to guide or monitor treatment for MRSA infections. Performed at [facility], [address] [place],  [address]   Culture, expectorated sputum-assessment     Status: None   Collection Time: 10/12/17  5:35 PM  Result Value Ref Range Status   Specimen Description EXPECTORATED SPUTUM  Final   Special Requests NONE  Final  Sputum evaluation   Final    Sputum specimen not acceptable for testing.  Please recollect.   C/TONI WALKER AT 0030 10/13/17.PMH Performed at [facility], [address] [place], [place] [address]    Report Status 10/13/2017 FINAL  Final  CULTURE, BLOOD (ROUTINE X 2) w Reflex to ID Panel     Status: None (Preliminary result)   Collection Time: 10/14/17  5:00 AM  Result Value Ref Range Status   Specimen Description BLOOD RIGHT ANTECUBITAL  Final   Special Requests   Final    BOTTLES DRAWN AEROBIC AND ANAEROBIC Blood Culture adequate volume   Culture   Final    NO GROWTH < 12 HOURS Performed at [facility], [address], [place], [place] [address]    Report Status PENDING  Incomplete  CULTURE, BLOOD (ROUTINE X 2) w Reflex to ID Panel     Status: None (Preliminary result)   Collection Time: 10/14/17  5:10 AM  Result Value Ref Range Status   Specimen Description BLOOD PORTA CATH  Final   Special Requests   Final    BOTTLES DRAWN AEROBIC AND ANAEROBIC Blood Culture adequate volume   Culture   Final    NO GROWTH < 12 HOURS Performed at [facility], [address], [place], [place] [address]    Report Status PENDING  Incomplete    Anti-infectives (From admission, onward)   Start      Dose/Rate Route Frequency Ordered Stop   10/14/17 2200  ceFAZolin (ANCEF) IVPB 2g/100 mL premix     2 g 200 mL/hr over 30 Minutes Intravenous Every 8 hours 10/14/17 1202     10/13/17 2200  ceFEPIme (MAXIPIME) 2 g in sodium chloride 0.9 % 100 mL IVPB  Status:  Discontinued     2 g 200 mL/hr over 30 Minutes Intravenous Every 12 hours 10/13/17 1253 10/14/17 1202   10/13/17 1600  vancomycin (VANCOCIN) IVPB 750 mg/150 ml premix  Status:  Discontinued     750 mg 150 mL/hr over 60 Minutes Intravenous Every 18 hours 10/13/17 1528 10/14/17 1202   10/12/17 1600  vancomycin (VANCOCIN) IVPB 750 mg/150 ml premix  Status:  Discontinued     750 mg 150 mL/hr over 60 Minutes Intravenous Every 36 hours 10/11/17 1531 10/13/17 1527   10/11/17 2330  ceFEPIme (MAXIPIME) 2 g in sodium chloride 0.9 % 100 mL IVPB  Status:  Discontinued     2 g 200 mL/hr over 30 Minutes Intravenous Every 8 hours 10/11/17 1839 10/13/17 1253   10/11/17 2200  piperacillin-tazobactam (ZOSYN) IVPB 3.375 g  Status:  Discontinued     3.375 g 12.5 mL/hr over 240 Minutes Intravenous Every 8 hours 10/11/17 1531 10/11/17 1837   10/11/17 1530  piperacillin-tazobactam (ZOSYN) IVPB 3.375 g     3.375 g 100 mL/hr over 30 Minutes Intravenous  Once 10/11/17 1522 10/11/17 1622   10/11/17 1530  vancomycin (VANCOCIN) IVPB 1000 mg/200 mL premix     1,000 mg 200 mL/hr over 60 Minutes Intravenous  Once 10/11/17 1522 10/11/17 1709      CXR: NNF  IMPRESSION: Metastatic breast cancer COPD Type 2 diabetes Acute on chronic hypoxemic respiratory failure Strep gallolyticus bacteremia Recent E coli bacteremia   PLAN/REC: Transfer to MedSurg Continue inhaled ICS/LABA, LAMA Continue albuterol as needed Continue supplemental oxygen as needed Antibiotics narrowed from Vanc/Zosyn to cefazolin 2/18 ID consultation requested by primary team After transfer, PCCM will sign off. Please call if we can be of  further assistance    Merton Border, MD PCCM  service Mobile [phone] Pager [phone] 10/14/2017 4:09 PM

## 2017-10-14 NOTE — Progress Notes (Signed)
Patient continued on chronic 2 L nasal cannula, no fevers, blood pressure stable during shift. Patient with transfer to med-surg level of care orders, just waiting on bed assignment. Patient steady on feet when transfer to bedside commode. Patient slept a majority of the shift, calm and cooperative, but reports feeling very tired even with extra sleep. Patient had bowel movement this morning.

## 2017-10-14 NOTE — Telephone Encounter (Signed)
I spoke to patient's son Pecola Lawless re: his mom's status. Aware of the over all guarded prognosis.

## 2017-10-14 NOTE — Progress Notes (Signed)
CRITICAL VALUE ALERT  Critical Value: K 3.0  Date & Time Notied:  10/14/17 0610  Provider Notified: Dr. Quentin Ore  Orders Received/Actions taken: K replacement

## 2017-10-14 NOTE — Progress Notes (Signed)
[facility] [place] NAME: Sophia Nelson    MR#:  [id number]  DATE OF BIRTH:  22-Jul-1946  SUBJECTIVE:  Admitted for sepsis.  Blood cultures returned with Streptococcus species.   More awake and sitting at the edge of the bed to eat  REVIEW OF SYSTEMS:    Review of Systems  Constitutional: Positive for malaise/fatigue. Negative for chills, fever and weight loss.  HENT: Negative for ear discharge, ear pain and nosebleeds.   Eyes: Negative for blurred vision, pain and discharge.  Respiratory: Negative for sputum production, shortness of breath, wheezing and stridor.   Cardiovascular: Negative for chest pain, palpitations, orthopnea and PND.  Gastrointestinal: Negative for abdominal pain, diarrhea, nausea and vomiting.  Genitourinary: Negative for frequency and urgency.  Musculoskeletal: Negative for back pain and joint pain.  Neurological: Positive for weakness. Negative for sensory change, speech change and focal weakness.  Psychiatric/Behavioral: Negative for depression and hallucinations. The patient is not nervous/anxious.     DRUG ALLERGIES:   Allergies  Allergen Reactions  . Other Other (See Comments)    Onions(that grow in the yard-pt can eat onions without problems) and dust mite Sneezing, cough, runny nose    VITALS:  Blood pressure 116/66, pulse 89, temperature 98.3 F (36.8 C), temperature source Oral, resp. rate (!) 21, height 5\' 2"  (1.575 m), weight 51.1 kg (112 lb 10.5 oz), SpO2 99 %.  PHYSICAL EXAMINATION:   Physical Exam  GENERAL:  72 y.o.-year-old patient lying in the bed EYES: Pupils equal, round, reactive to light and accommodation.  HEENT: Head atraumatic, normocephalic. Oropharynx and nasopharynx clear. 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 No thyroid enlargement, no tenderness.  LUNGS: Bibasilar crackles. Right UE port + site looks ok. No tenderness CARDIOVASCULAR: S1, S2.   Tachycardic ABDOMEN: Soft, nontender, nondistended. Bowel sounds present. No organomegaly or mass.  EXTREMITIES: No cyanosis, clubbing or edema b/l.    NEUROLOGIC: Cranial nerves II through XII are intact. No focal Motor or sensory deficits b/l.   PSYCHIATRIC:  patient is A and O x3  LABORATORY PANEL:   CBC Recent Labs  Lab 10/14/17 0501  WBC 10.2  HGB 8.4*  HCT 25.4*  PLT 323   ------------------------------------------------------------------------------------------------------------------ Chemistries  Recent Labs  Lab 10/11/17 1522  10/13/17 0446  10/14/17 0501  NA 140   < > 138  --  141  K 2.9*   < > 3.4*   < > 3.0*  CL 98*   < > 109  --  110  CO2 29   < > 26  --  24  GLUCOSE 116*   < > 103*  --  99  BUN <5*   < > 6  --  6  CREATININE 0.45   < > 0.35*  --  0.38*  CALCIUM 9.1   < > 7.4*  --  7.8*  MG  --    < > 2.7*  --   --   AST 224*  --   --   --   --   ALT 44  --   --   --   --   ALKPHOS 390*  --   --   --   --   BILITOT 1.9*  --   --   --   --    < > = values in this interval not displayed.   ------------------------------------------------------------------------------------------------------------------  Cardiac Enzymes Recent Labs  Lab 10/13/17 0446  TROPONINI 0.39*   ------------------------------------------------------------------------------------------------------------------  RADIOLOGY:  No results found.   ASSESSMENT AND PLAN:   72 year old female with past medical history of breast cancer currently undergoing chemotherapy, hypertension, diabetes, COPD, previous history of pancreatitis, chronic back pain, previous history of gastric outlet obstruction who presents to the hospital due to fever, hypotension weakness lethargy and malaise.  * Septic shock secondary to Streptococcus bacteremia.  Etiology could be bowel with her abdominal mets around the bowel. -appreciate ID input---now on IV rocephin -Blood pressure much better today -TTE  tomorrow to r/o endocarditis -?remove port---d/w ID and Oncology  * Acute toxic metabolic encephalopathy. Improving  *Elevated troponin likely due to demand ischemia.  No significant elevation on repeat troponin check.  * Metastatic breast cancer.  Follows with cancer center as outpatient. -  Discussed with Dr. Smitty Pluck has poor performance status due to current illness and previous admissions.  * Anemia of chronic disease is stable  *DVT prophylaxis with Lovenox  All the records are reviewed and case discussed with Care Management/Social Worker Management plans discussed with the patient and they are in agreement.  CODE STATUS: FULL CODE  DVT Prophylaxis: SCDs  TOTAL CRITICAL CARE TIME TAKING CARE OF THIS PATIENT: 30 minutes.   Fritzi Mandes M.D on 10/14/2017 at 8:05 PM  Between 7am to 6pm - Pager - [phone]  After 6pm go to [email or web address] - password [email or web address] [facility]  Office  [phone]  CC: Primary care physician; Donnie Coffin, MD  Note: This dictation was prepared with Dragon dictation along with smaller phrase technology. Any transcriptional errors that result from this process are unintentional.

## 2017-10-14 NOTE — Progress Notes (Signed)
Pharmacy Antibiotic Note  Sophia Nelson is a 72 y.o. female admitted on 10/11/2017 with bacteremia.  Pharmacy has been consulted for cefazolin dosing.  Plan: Discontinue cefepime and vancomycin due to BCID results. Start cefazolin 2g IV q8h.  2/18 is day 4 of antibiotic therapy.  Antimicrobials this admission: Zosyn x 1 Cefepime 2/15 >> 2/18 Vancomycin 2/15 >> 2/18 Cefazolin 2/18 >>    Microbiology results: 2/15 BCx: Streptococcus gallolyticus. Susceptible to cephalosporins. 2/15 UCx: neg  2/16 Sputum: neg  2/16 MRSA PCR: neg  Pharmacy will continue to monitor per consult.  Height: 5\' 2"  (157.5 cm) Weight: 112 lb 10.5 oz (51.1 kg) IBW/kg (Calculated) : 50.1  Temp (24hrs), Avg:98.6 F (37 C), Min:98.4 F (36.9 C), Max:98.7 F (37.1 C)  Recent Labs  Lab 10/11/17 1418 10/11/17 1522 10/11/17 1828 10/12/17 0633 10/12/17 2248 10/13/17 0446 10/14/17 0501  WBC 17.4* 16.0*  --  23.1*  --  11.3* 10.2  CREATININE 0.38* 0.45  --  0.50 0.37* 0.35* 0.38*  LATICACIDVEN  --  1.5 1.3  --  0.6  --   --     Estimated Creatinine Clearance: 52.5 mL/min (A) (by C-G formula based on SCr of 0.38 mg/dL (L)).    Allergies  Allergen Reactions  . Other Other (See Comments)    Onions(that grow in the yard-pt can eat onions without problems) and dust mite Sneezing, cough, runny nose     Thank you for allowing pharmacy to be a part of this patient's care.  [place] Chayim Bialas 10/14/2017 11:56 AM

## 2017-10-14 NOTE — Consult Note (Signed)
[facility] Infectious Disease     Reason for Consult: Strep bacteremia    Referring Physician: Nicholes Mango  Date of Admission:  10/11/2017   Active Problems:   Sepsis ([place])   Protein-calorie malnutrition, severe   HPI: Sophia Nelson is a 72 y.o. female with hx of metastatic breast cancer undergoing chemo admitted with lethargy weakness and fever. She on admission had temp 102, wbc 17.   Carter + for Streptococcus gallolyticus.  She has a portacath in place but no pain at the site. FU bcx done 2/18 pending. Clinically improving. WBC down to 10, no further fevers. She has CT scan showing new R pelvis mass suspicious for mets, although could be hematoma or abscess. Also multiple abnormal lesions on bowel on L and R suggesting metastatic lesions.    Past Medical History:  Diagnosis Date  . Abdominal distention   . AKI (acute kidney injury) ([place]) 02/24/2017  . Anemia   . Arthritis   . Asthma   . Back pain, chronic 03/15/2017  . Breast cancer ([place]) 2009   RT MASTECTOMY, DCIS  . Cancer of intra-abdominal ([place])    Metastatic breast cancer lobular carcinoma wth recurrence in the abdomen status post resection  . Cancer of left breast ([place]) 08/09/2015   T4 N1 M1 tumor, INVASIVE LOBULAR CARCINOMA.  . Carcinoma of overlapping sites of left breast in female, estrogen receptor positive ([place]) 03/22/2016  . COPD (chronic obstructive pulmonary disease) ([place])   . Diabetes ([place]) 02/24/2017  . Diabetes mellitus without complication ([place])   . Duodenal obstruction   . Encounter for nasogastric (NG) tube placement   . Gastric outlet obstruction 02/24/2017  . GERD (gastroesophageal reflux disease)   . Headache    h/o migraines as a child  . Hypertension   . Neck pain 04/19/2016  . Pancreatitis, acute 03/04/2017  . Pneumonia 2015  . Recurrent low back pain 03/15/2017  . Shortness of breath dyspnea    with exertion   Past Surgical History:  Procedure Laterality Date  . ABDOMINAL HYSTERECTOMY    . BREAST  SURGERY Right 2015   mastectomy  . ESOPHAGOGASTRODUODENOSCOPY N/A 02/25/2017   Procedure: ESOPHAGOGASTRODUODENOSCOPY (EGD);  Surgeon: Jonathon Bellows, MD;  Location: [facility] ENDOSCOPY;  Service: Endoscopy;  Laterality: N/A;  . ESOPHAGOGASTRODUODENOSCOPY (EGD) WITH PROPOFOL N/A 03/06/2017   Procedure: ESOPHAGOGASTRODUODENOSCOPY (EGD) WITH PROPOFOL;  Surgeon: [name], MD;  Location: [facility] ENDOSCOPY;  Service: Endoscopy;  Laterality: N/A;  . GASTROJEJUNOSTOMY N/A 03/08/2017   Procedure: Roux-en-Y gastrojejunostomy Bypass of Gastric Outlet Obstruction, small bowel resection;  Surgeon: Vickie Epley, MD;  Location: [facility] ORS;  Service: General;  Laterality: N/A;  . PORTACATH PLACEMENT Right 08/01/2017   Procedure: INSERTION PORT-A-CATH;  Surgeon: Christene Lye, MD;  Location: [facility] ORS;  Service: General;  Laterality: Right;  . SIMPLE MASTECTOMY WITH AXILLARY SENTINEL NODE BIOPSY Left 02/29/2016   Procedure: SIMPLE MASTECTOMY;  Surgeon: Christene Lye, MD;  Location: [facility] ORS;  Service: General;  Laterality: Left;  . TUBAL LIGATION     Social History   Tobacco Use  . Smoking status: Former Smoker    Packs/day: 0.25    Years: 15.00    Pack years: 3.75    Types: Cigarettes    Last attempt to quit: 07/23/2017    Years since quitting: 0.2  . Smokeless tobacco: Never Used  . Tobacco comment: currently as of 02-21-16 pt states she is only smoking 2-3 cigarettes per day  Substance Use Topics  . 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 Drug use: No   Family History  Problem Relation Age of Onset  . Lung cancer Father   . Cancer Maternal Aunt   . Dementia Mother   . Breast cancer Neg Hx     Allergies:  Allergies  Allergen Reactions  . Other Other (See Comments)    Onions(that grow in the yard-pt can eat onions without problems) and dust mite Sneezing, cough,  runny nose    Current antibiotics: Antibiotics Given (last 72 hours)    Date/Time Action Medication Dose Rate   10/11/17 1550 New Bag/Given   piperacillin-tazobactam (ZOSYN) IVPB 3.375 g 3.375 g 100 mL/hr   10/11/17 1550 New Bag/Given   vancomycin (VANCOCIN) IVPB 1000 mg/200 mL premix 1,000 mg 200 mL/hr   10/11/17 2356 New Bag/Given   ceFEPIme (MAXIPIME) 2 g in sodium chloride 0.9 % 100 mL IVPB 2 g 200 mL/hr   10/12/17 3009 New Bag/Given   ceFEPIme (MAXIPIME) 2 g in sodium chloride 0.9 % 100 mL IVPB 2 g 200 mL/hr   10/12/17 1418 New Bag/Given   ceFEPIme (MAXIPIME) 2 g in sodium chloride 0.9 % 100 mL IVPB 2 g 200 mL/hr   10/12/17 1736 New Bag/Given   vancomycin (VANCOCIN) IVPB 750 mg/150 ml premix 750 mg 150 mL/hr   10/12/17 2130 New Bag/Given   ceFEPIme (MAXIPIME) 2 g in sodium chloride 0.9 % 100 mL IVPB 2 g 200 mL/hr   10/13/17 0630 New Bag/Given   ceFEPIme (MAXIPIME) 2 g in sodium chloride 0.9 % 100 mL IVPB 2 g 200 mL/hr   10/13/17 1639 New Bag/Given   vancomycin (VANCOCIN) IVPB 750 mg/150 ml premix 750 mg 150 mL/hr   10/13/17 2119 New Bag/Given   ceFEPIme (MAXIPIME) 2 g in sodium chloride 0.9 % 100 mL IVPB 2 g 200 mL/hr   10/14/17 1003 New Bag/Given   ceFEPIme (MAXIPIME) 2 g in sodium chloride 0.9 % 100 mL IVPB 2 g 200 mL/hr   10/14/17 1111 New Bag/Given   vancomycin (VANCOCIN) IVPB 750 mg/150 ml premix 750 mg 150 mL/hr      MEDICATIONS: . aspirin EC  81 mg Oral Daily  . atorvastatin  10 mg Oral Daily  . benzonatate  100 mg Oral TID  . calcium-vitamin D  1 tablet Oral TID  . docusate sodium  100 mg Oral BID  . enoxaparin (LOVENOX) injection  40 mg Subcutaneous Q24H  . feeding supplement (ENSURE ENLIVE)  237 mL Oral TID BM  . fluticasone  2 spray Each Nare Daily  . gabapentin  300 mg Oral BID  . loratadine  10 mg Oral Daily  . mouth rinse  15 mL Mouth Rinse BID  . mometasone-formoterol  2 puff Inhalation BID  . multivitamin with minerals  1 tablet Oral Daily  . [START  ON 10/15/2017] pantoprazole sodium  20 mg Oral Daily  . sodium chloride flush  10-40 mL Intracatheter Q12H  . thiamine  100 mg Oral Daily  . tiotropium  18 mcg Inhalation Daily  . vitamin B-12  500 mcg Oral Daily    Review of Systems - 11 systems reviewed and negative per HPI   OBJECTIVE: Temp:  [98.5 F (36.9 C)-98.7 F (37.1 C)] 98.7 F (37.1 C) (02/18 0800) Pulse Rate:  [80-107] 84 (02/18 1400) Resp:  [12-22] 14 (02/18 1400)  BP: (95-137)/(61-85) 127/75 (02/18 1400) SpO2:  [92 %-100 %] 100 % (02/18 1400) Physical Exam  Constitutional:  Slowed mentation, interactive. Chronically ill appearing HENT: [place]/AT, PERRLA, pale no scleral icterus Mouth/Throat: Oropharynx is clear and dry . No oropharyngeal exudate.  Cardiovascular: Normal rate, regular rhythm and normal heart sounds. Distant  Pulmonary/Chest: Effort normal and breath sounds normal. No respiratory distress.  has no wheezes.  Neck = supple, no nuchal rigidity Abdominal: Soft. Bowel sounds are normal.  exhibits no distension. There is no tenderness.  Lymphadenopathy: no cervical adenopathy. No axillary adenopathy Neurological: alert and interactive but slowed mentation  Portacath site R chest wall wn, Skin: Skin is warm and dry. No rash noted. No erythema.  Psychiatric: a normal mood and affect.  behavior is normal.    LABS: Results for orders placed or performed during the hospital encounter of 10/11/17 (from the past 48 hour(s))  Culture, expectorated sputum-assessment     Status: None   Collection Time: 10/12/17  5:35 PM  Result Value Ref Range   Specimen Description EXPECTORATED SPUTUM    Special Requests NONE    Sputum evaluation      Sputum specimen not acceptable for testing.  Please recollect.   C/TONI WALKER AT 0030 10/13/17.PMH Performed at [facility], [address] [place], [place] [address]    Report Status 10/13/2017 FINAL   Basic metabolic panel     Status: Abnormal   Collection Time:  10/12/17 10:48 PM  Result Value Ref Range   Sodium 139 135 - 145 mmol/L   Potassium 3.0 (L) 3.5 - 5.1 mmol/L   Chloride 108 101 - 111 mmol/L   CO2 26 22 - 32 mmol/L   Glucose, Bld 94 65 - 99 mg/dL   BUN 8 6 - 20 mg/dL   Creatinine, Ser 0.37 (L) 0.44 - 1.00 mg/dL   Calcium 7.6 (L) 8.9 - 10.3 mg/dL   GFR calc non Af Amer >60 >60 mL/min   GFR calc Af Amer >60 >60 mL/min    Comment: (NOTE) The eGFR has been calculated using the CKD EPI equation. This calculation has not been validated in all clinical situations. eGFR's persistently <60 mL/min signify possible Chronic Kidney Disease.    Anion gap 5 5 - 15    Comment: Performed at [facility], [address] [place], [place] [address]  Magnesium     Status: Abnormal   Collection Time: 10/12/17 10:48 PM  Result Value Ref Range   Magnesium 0.9 (LL) 1.7 - 2.4 mg/dL    Comment: CRITICAL RESULT CALLED TO, READ BACK BY AND VERIFIED WITH KELSEY WATTS AT 2359 10/12/17.PMH Performed at [facility], [address] [place], [place] [address]   Phosphorus     Status: Abnormal   Collection Time: 10/12/17 10:48 PM  Result Value Ref Range   Phosphorus 2.2 (L) 2.5 - 4.6 mg/dL    Comment: Performed at [facility], [address] [place], [place] [address]  Troponin I     Status: Abnormal   Collection Time: 10/12/17 10:48 PM  Result Value Ref Range   Troponin I 0.37 (HH) <0.03 ng/mL    Comment: CRITICAL VALUE NOTED. VALUE IS CONSISTENT WITH PREVIOUSLY REPORTED/CALLED VALUE.PMH Performed at [facility], [address] [place], [place] [address]   Lactic acid, plasma     Status: None   Collection Time: 10/12/17 10:48 PM  Result Value Ref Range   Lactic Acid, Venous 0.6 0.5 - 1.9 mmol/L  Comment: Performed at [facility], [address] [place], [place] [address]  CBC with Differential/Platelet     Status: Abnormal   Collection Time: 10/13/17  4:46 AM  Result Value Ref Range   WBC 11.3  (H) 3.6 - 11.0 K/uL   RBC 2.87 (L) 3.80 - 5.20 MIL/uL   Hemoglobin 8.0 (L) 12.0 - 16.0 g/dL   HCT 24.4 (L) 35.0 - 47.0 %   MCV 85.2 80.0 - 100.0 fL   MCH 27.8 26.0 - 34.0 pg   MCHC 32.6 32.0 - 36.0 g/dL   RDW 16.6 (H) 11.5 - 14.5 %   Platelets 346 150 - 440 K/uL   Neutrophils Relative % 81 %   Neutro Abs 9.2 (H) 1.4 - 6.5 K/uL   Lymphocytes Relative 7 %   Lymphs Abs 0.7 (L) 1.0 - 3.6 K/uL   Monocytes Relative 10 %   Monocytes Absolute 1.2 (H) 0.2 - 0.9 K/uL   Eosinophils Relative 2 %   Eosinophils Absolute 0.2 0 - 0.7 K/uL   Basophils Relative 0 %   Basophils Absolute 0.0 0 - 0.1 K/uL    Comment: Performed at [facility], [address], [place], [place] [address]  Basic metabolic panel     Status: Abnormal   Collection Time: 10/13/17  4:46 AM  Result Value Ref Range   Sodium 138 135 - 145 mmol/L   Potassium 3.4 (L) 3.5 - 5.1 mmol/L   Chloride 109 101 - 111 mmol/L   CO2 26 22 - 32 mmol/L   Glucose, Bld 103 (H) 65 - 99 mg/dL   BUN 6 6 - 20 mg/dL   Creatinine, Ser 0.35 (L) 0.44 - 1.00 mg/dL   Calcium 7.4 (L) 8.9 - 10.3 mg/dL   GFR calc non Af Amer >60 >60 mL/min   GFR calc Af Amer >60 >60 mL/min    Comment: (NOTE) The eGFR has been calculated using the CKD EPI equation. This calculation has not been validated in all clinical situations. eGFR's persistently <60 mL/min signify possible Chronic Kidney Disease.    Anion gap 3 (L) 5 - 15    Comment: Performed at [facility], [address] [place], [place] [address]  Magnesium     Status: Abnormal   Collection Time: 10/13/17  4:46 AM  Result Value Ref Range   Magnesium 2.7 (H) 1.7 - 2.4 mg/dL    Comment: Performed at [facility], [address] [place], [place] [address]  Phosphorus     Status: None   Collection Time: 10/13/17  4:46 AM  Result Value Ref Range   Phosphorus 3.5 2.5 - 4.6 mg/dL    Comment: Performed at [facility], [address] [place], [place] [address]   Troponin I     Status: Abnormal   Collection Time: 10/13/17  4:46 AM  Result Value Ref Range   Troponin I 0.39 (HH) <0.03 ng/mL    Comment: CRITICAL VALUE NOTED. VALUE IS CONSISTENT WITH PREVIOUSLY REPORTED/CALLED VALUE  SDR Performed at [facility], [address] [place], [place] [address]   Glucose, capillary     Status: Abnormal   Collection Time: 10/13/17  7:28 AM  Result Value Ref Range   Glucose-Capillary 104 (H) 65 - 99 mg/dL  Potassium     Status: None   Collection Time: 10/13/17 10:07 AM  Result Value Ref Range   Potassium 3.6 3.5 - 5.1 mmol/L    Comment: Performed at [facility],  [place], [place] [address]  CULTURE, BLOOD (ROUTINE X 2) w Reflex to ID Panel     Status: None (Preliminary result)   Collection Time: 10/14/17  5:00 AM  Result Value Ref Range   Specimen Description BLOOD RIGHT ANTECUBITAL    Special Requests      BOTTLES DRAWN AEROBIC AND ANAEROBIC Blood Culture adequate volume   Culture      NO GROWTH < 12 HOURS Performed at [facility], [address] [place], [place] [address]    Report Status PENDING   CBC with Differential/Platelet     Status: Abnormal   Collection Time: 10/14/17  5:01 AM  Result Value Ref Range   WBC 10.2 3.6 - 11.0 K/uL   RBC 2.96 (L) 3.80 - 5.20 MIL/uL   Hemoglobin 8.4 (L) 12.0 - 16.0 g/dL   HCT 25.4 (L) 35.0 - 47.0 %   MCV 85.7 80.0 - 100.0 fL   MCH 28.2 26.0 - 34.0 pg   MCHC 33.0 32.0 - 36.0 g/dL   RDW 17.1 (H) 11.5 - 14.5 %   Platelets 323 150 - 440 K/uL   Neutrophils Relative % 71 %   Neutro Abs 7.3 (H) 1.4 - 6.5 K/uL   Lymphocytes Relative 10 %   Lymphs Abs 1.0 1.0 - 3.6 K/uL   Monocytes Relative 15 %   Monocytes Absolute 1.5 (H) 0.2 - 0.9 K/uL   Eosinophils Relative 4 %   Eosinophils Absolute 0.4 0 - 0.7 K/uL   Basophils Relative 0 %   Basophils Absolute 0.0 0 - 0.1 K/uL    Comment: Performed at [facility], [address] [place], [place] [address]   Basic metabolic panel     Status: Abnormal   Collection Time: 10/14/17  5:01 AM  Result Value Ref Range   Sodium 141 135 - 145 mmol/L   Potassium 3.0 (L) 3.5 - 5.1 mmol/L   Chloride 110 101 - 111 mmol/L   CO2 24 22 - 32 mmol/L   Glucose, Bld 99 65 - 99 mg/dL   BUN 6 6 - 20 mg/dL   Creatinine, Ser 0.38 (L) 0.44 - 1.00 mg/dL   Calcium 7.8 (L) 8.9 - 10.3 mg/dL   GFR calc non Af Amer >60 >60 mL/min   GFR calc Af Amer >60 >60 mL/min    Comment: (NOTE) The eGFR has been calculated using the CKD EPI equation. This calculation has not been validated in all clinical situations. eGFR's persistently <60 mL/min signify possible Chronic Kidney Disease.    Anion gap 7 5 - 15    Comment: Performed at [facility], [address] [place], [place] [address]  CULTURE, BLOOD (ROUTINE X 2) w Reflex to ID Panel     Status: None (Preliminary result)   Collection Time: 10/14/17  5:10 AM  Result Value Ref Range   Specimen Description BLOOD PORTA CATH    Special Requests      BOTTLES DRAWN AEROBIC AND ANAEROBIC Blood Culture adequate volume   Culture      NO GROWTH < 12 HOURS Performed at [facility], [address], [place], [place] [address]    Report Status PENDING    No components found for: ESR, C REACTIVE PROTEIN MICRO: Recent Results (from the past 720 hour(s))  Blood Culture (routine x 2)     Status: Abnormal   Collection Time: 09/21/17  3:44 AM  Result Value Ref Range Status   Specimen Description  Final    BLOOD PORTA CATH Performed at [facility], [address] [place], [place] [address]    Special Requests   Final    BOTTLES DRAWN AEROBIC AND ANAEROBIC Blood Culture results may not be optimal due to an excessive volume of blood received in culture bottles Performed at [facility], [address] [place], [place] [address]    Culture  Setup Time   Final    GRAM NEGATIVE RODS ANAEROBIC BOTTLE ONLY CRITICAL RESULT CALLED TO, READ  BACK BY AND VERIFIED WITH: [place] ON 09/21/17 AT 2000 QSD Performed at [facility], [place] [address] [place], [place] [address]    Culture ESCHERICHIA COLI (A)  Final   Report Status 09/24/2017 FINAL  Final   Organism ID, Bacteria ESCHERICHIA COLI  Final      Susceptibility   Escherichia coli - MIC*    AMPICILLIN 8 SENSITIVE Sensitive     CEFAZOLIN <=4 SENSITIVE Sensitive     CEFEPIME <=1 SENSITIVE Sensitive     CEFTAZIDIME <=1 SENSITIVE Sensitive     CEFTRIAXONE <=1 SENSITIVE Sensitive     CIPROFLOXACIN <=0.25 SENSITIVE Sensitive     GENTAMICIN <=1 SENSITIVE Sensitive     IMIPENEM <=0.25 SENSITIVE Sensitive     TRIMETH/SULFA <=20 SENSITIVE Sensitive     AMPICILLIN/SULBACTAM 4 SENSITIVE Sensitive     PIP/TAZO <=4 SENSITIVE Sensitive     Extended ESBL NEGATIVE Sensitive     * ESCHERICHIA COLI  Urine culture     Status: None   Collection Time: 09/21/17  3:44 AM  Result Value Ref Range Status   Specimen Description   Final    URINE, CLEAN CATCH Performed at [facility], [address], [place], [place] [address]    Special Requests   Final    NONE Performed at [facility], [address], [place], [place] [address]    Culture   Final    NO GROWTH Performed at [facility], [place] [address] [place], [place] [address]    Report Status 09/22/2017 FINAL  Final  Blood Culture ID Panel (Reflexed)     Status: Abnormal   Collection Time: 09/21/17  3:44 AM  Result Value Ref Range Status   Enterococcus species NOT DETECTED NOT DETECTED Final   Vancomycin resistance NOT DETECTED NOT DETECTED Final   Listeria monocytogenes NOT DETECTED NOT DETECTED Final   Staphylococcus species NOT DETECTED NOT DETECTED Final   Staphylococcus aureus NOT DETECTED NOT DETECTED Final   Methicillin resistance NOT DETECTED NOT DETECTED Final   Streptococcus species NOT DETECTED NOT DETECTED Final   Streptococcus agalactiae NOT DETECTED NOT DETECTED Final    Streptococcus pneumoniae NOT DETECTED NOT DETECTED Final   Streptococcus pyogenes NOT DETECTED NOT DETECTED Final   Acinetobacter baumannii NOT DETECTED NOT DETECTED Final   Enterobacteriaceae species DETECTED (A) NOT DETECTED Final    Comment: CRITICAL RESULT CALLED TO, READ BACK BY AND VERIFIED WITH: NATE COOKSON ON 09/21/17 AT 2000 QSD Enterobacteriaceae represent a large family of gram-negative bacteria, not a single organism.    Enterobacter cloacae complex NOT DETECTED NOT DETECTED Final   Escherichia coli DETECTED (A) NOT DETECTED Final    Comment: CRITICAL RESULT CALLED TO, READ BACK BY AND VERIFIED WITH: NATE COOKSON ON 09/21/17 AT 2000 QSD    Klebsiella oxytoca NOT DETECTED NOT DETECTED Final   Klebsiella pneumoniae NOT DETECTED NOT DETECTED Final   Proteus species NOT DETECTED NOT DETECTED Final  Serratia marcescens NOT DETECTED NOT DETECTED Final   Carbapenem resistance NOT DETECTED NOT DETECTED Final   Haemophilus influenzae NOT DETECTED NOT DETECTED Final   Neisseria meningitidis NOT DETECTED NOT DETECTED Final   Pseudomonas aeruginosa NOT DETECTED NOT DETECTED Final   Candida albicans NOT DETECTED NOT DETECTED Final   Candida glabrata NOT DETECTED NOT DETECTED Final   Candida krusei NOT DETECTED NOT DETECTED Final   Candida parapsilosis NOT DETECTED NOT DETECTED Final   Candida tropicalis NOT DETECTED NOT DETECTED Final    Comment: Performed at [facility], [address] [place], [place] [address]  Blood Culture (routine x 2)     Status: None   Collection Time: 09/21/17  4:33 AM  Result Value Ref Range Status   Specimen Description BLOOD LEFT ANTECUBITAL  Final   Special Requests   Final    BOTTLES DRAWN AEROBIC AND ANAEROBIC Blood Culture adequate volume   Culture   Final    NO GROWTH 5 DAYS Performed at [facility], [address], [place], [place] [address]    Report Status 09/26/2017 FINAL  Final  Urine culture     Status: None    Collection Time: 10/11/17  3:22 PM  Result Value Ref Range Status   Specimen Description   Final    URINE, RANDOM Performed at [facility], [address], [place], [place] [address]    Special Requests   Final    NONE Performed at [facility], [address], [place], [place] [address]    Culture   Final    NO GROWTH Performed at [facility], [place] [address], [place], [place] [address]    Report Status 10/13/2017 FINAL  Final  Culture, blood (Routine x 2)     Status: Abnormal   Collection Time: 10/11/17  3:23 PM  Result Value Ref Range Status   Specimen Description   Final    BLOOD BLOOD LEFT FOREARM Performed at [facility], [address], [place], [place] [address]    Special Requests   Final    BOTTLES DRAWN AEROBIC AND ANAEROBIC Blood Culture adequate volume Performed at [facility], [address] [place], [place] [address]    Culture  Setup Time   Final    GRAM POSITIVE COCCI IN BOTH AEROBIC AND ANAEROBIC BOTTLES CRITICAL RESULT CALLED TO, READ BACK BY AND VERIFIED WITH: DAVID BESANTI AT 7322 10/12/17.PMH Performed at [facility], [address] [place], [place] [address]    Culture (A)  Final    STREPTOCOCCUS GALLOLYTICUS SUSCEPTIBILITIES PERFORMED ON PREVIOUS CULTURE WITHIN THE LAST 5 DAYS. Performed at [facility], [place] [address] [place], [place] [address]    Report Status 10/14/2017 FINAL  Final  Culture, blood (Routine x 2)     Status: Abnormal   Collection Time: 10/11/17  3:23 PM  Result Value Ref Range Status   Specimen Description   Final    BLOOD PORTA CATH Performed at [facility], [address], [place], [place] [address]    Special Requests   Final    BOTTLES DRAWN AEROBIC AND ANAEROBIC Blood Culture results may not be optimal due to an excessive volume of blood received in culture bottles Performed at [place] [place], [place] [address]     Culture  Setup Time   Final    GRAM POSITIVE COCCI IN BOTH AEROBIC AND ANAEROBIC BOTTLES CRITICAL RESULT CALLED TO, READ  BACK BY AND VERIFIED WITH: DAVID BESANTI AT 5361 10/12/17.PMH Performed at [facility], [place] [address] [place], [place] [address]    Culture STREPTOCOCCUS GALLOLYTICUS (A)  Final   Report Status 10/14/2017 FINAL  Final   Organism ID, Bacteria STREPTOCOCCUS GALLOLYTICUS  Final      Susceptibility   Streptococcus gallolyticus - MIC*    PENICILLIN 0.12 SENSITIVE Sensitive     CEFTRIAXONE 0.25 SENSITIVE Sensitive     ERYTHROMYCIN <=0.12 SENSITIVE Sensitive     LEVOFLOXACIN 2 SENSITIVE Sensitive     VANCOMYCIN 0.25 SENSITIVE Sensitive     * STREPTOCOCCUS GALLOLYTICUS  Blood Culture ID Panel (Reflexed)     Status: Abnormal   Collection Time: 10/11/17  3:23 PM  Result Value Ref Range Status   Enterococcus species NOT DETECTED NOT DETECTED Final   Listeria monocytogenes NOT DETECTED NOT DETECTED Final   Staphylococcus species NOT DETECTED NOT DETECTED Final   Staphylococcus aureus NOT DETECTED NOT DETECTED Final   Streptococcus species DETECTED (A) NOT DETECTED Final    Comment: Not Enterococcus species, Streptococcus agalactiae, Streptococcus pyogenes, or Streptococcus pneumoniae. CRITICAL RESULT CALLED TO, READ BACK BY AND VERIFIED WITH: DAVID BESANTI AT 4008 10/12/17.PMH    Streptococcus agalactiae NOT DETECTED NOT DETECTED Final   Streptococcus pneumoniae NOT DETECTED NOT DETECTED Final   Streptococcus pyogenes NOT DETECTED NOT DETECTED Final   Acinetobacter baumannii NOT DETECTED NOT DETECTED Final   Enterobacteriaceae species NOT DETECTED NOT DETECTED Final   Enterobacter cloacae complex NOT DETECTED NOT DETECTED Final   Escherichia coli NOT DETECTED NOT DETECTED Final   Klebsiella oxytoca NOT DETECTED NOT DETECTED Final   Klebsiella pneumoniae NOT DETECTED NOT DETECTED Final   Proteus species NOT DETECTED NOT DETECTED Final   Serratia marcescens NOT  DETECTED NOT DETECTED Final   Haemophilus influenzae NOT DETECTED NOT DETECTED Final   Neisseria meningitidis NOT DETECTED NOT DETECTED Final   Pseudomonas aeruginosa NOT DETECTED NOT DETECTED Final   Candida albicans NOT DETECTED NOT DETECTED Final   Candida glabrata NOT DETECTED NOT DETECTED Final   Candida krusei NOT DETECTED NOT DETECTED Final   Candida parapsilosis NOT DETECTED NOT DETECTED Final   Candida tropicalis NOT DETECTED NOT DETECTED Final    Comment: Performed at [facility], [address] [place], [place] [address]  MRSA PCR Screening     Status: None   Collection Time: 10/12/17  2:23 PM  Result Value Ref Range Status   MRSA by PCR NEGATIVE NEGATIVE Final    Comment:        The GeneXpert MRSA Assay (FDA approved for NASAL specimens only), is one component of a comprehensive MRSA colonization surveillance program. It is not intended to diagnose MRSA infection nor to guide or monitor treatment for MRSA infections. Performed at [facility], [address] [place], [place] [address]   Culture, expectorated sputum-assessment     Status: None   Collection Time: 10/12/17  5:35 PM  Result Value Ref Range Status   Specimen Description EXPECTORATED SPUTUM  Final   Special Requests NONE  Final   Sputum evaluation   Final    Sputum specimen not acceptable for testing.  Please recollect.   C/TONI WALKER AT 0030 10/13/17.PMH Performed at [facility], [address] [place], [place] [address]    Report Status 10/13/2017 FINAL  Final  CULTURE, BLOOD (ROUTINE X 2) w Reflex to ID Panel     Status: None (Preliminary result)   Collection Time: 10/14/17  5:00 AM  Result Value Ref Range Status   Specimen Description BLOOD RIGHT ANTECUBITAL  Final   Special Requests   Final    BOTTLES DRAWN AEROBIC AND ANAEROBIC Blood Culture adequate volume   Culture   Final    NO GROWTH < 12 HOURS Performed at [facility], [address]  [place], [place] [address]    Report Status PENDING  Incomplete  CULTURE, BLOOD (ROUTINE X 2) w Reflex to ID Panel     Status: None (Preliminary result)   Collection Time: 10/14/17  5:10 AM  Result Value Ref Range Status   Specimen Description BLOOD PORTA CATH  Final   Special Requests   Final    BOTTLES DRAWN AEROBIC AND ANAEROBIC Blood Culture adequate volume   Culture   Final    NO GROWTH < 12 HOURS Performed at [facility], [address], [place], [place] [address]    Report Status PENDING  Incomplete    IMAGING: Ct Head Wo Contrast  Result Date: 10/12/2017 CLINICAL DATA:  Altered level of consciousness. EXAM: CT HEAD WITHOUT CONTRAST TECHNIQUE: Contiguous axial images were obtained from the base of the skull through the vertex without intravenous contrast. COMPARISON:  06/09/2017 FINDINGS: Brain: There is no evidence for acute hemorrhage, hydrocephalus, mass lesion, or abnormal extra-axial fluid collection. No definite CT evidence for acute infarction. Vascular: No hyperdense vessel or unexpected calcification. Skull: No evidence for fracture. No worrisome lytic or sclerotic lesion. Sinuses/Orbits: The visualized paranasal sinuses and mastoid air cells are clear. Visualized portions of the globes and intraorbital fat are unremarkable. Other: None. IMPRESSION: Stable. Negative head CT without new or acute intracranial abnormality. Electronically Signed   By: Misty Stanley M.D.   On: 10/12/2017 12:20   Ct Abdomen Pelvis W Contrast  Result Date: 10/11/2017 CLINICAL DATA:  Metastatic breast cancer with abdominal and small bowel involvement. Status post Roux-en-Y for bowel obstruction EXAM: CT ABDOMEN AND PELVIS WITH CONTRAST TECHNIQUE: Multidetector CT imaging of the abdomen and pelvis was performed using the standard protocol following bolus administration of intravenous contrast. CONTRAST:  55m ISOVUE-300 IOPAMIDOL (ISOVUE-300) INJECTION 61% COMPARISON:  09/22/2017 FINDINGS: Lower  chest: Unremarkable. Hepatobiliary: No focal abnormality in the liver parenchyma. There is no evidence for gallstones, gallbladder wall thickening, or pericholecystic fluid. No intrahepatic or extrahepatic biliary dilation. Pancreas: No focal mass lesion. No dilatation of the main duct. No intraparenchymal cyst. No peripancreatic edema. Spleen: No splenomegaly. No focal mass lesion. Adrenals/Urinary Tract: No adrenal nodule or mass. Similar appearance mild right hydronephrosis mild fullness left intrarenal collecting system is stable. Circumferential bladder wall thickening is slightly more prominent than before. Stomach/Bowel: Status post gastric bypass. There is edema in the retroperitoneal tissues around the transverse duodenum and duodenal bulb appears distended. These features are similar to prior study. No overt small bowel obstruction. Terminal ileum shows hyperenhancement. Focus of hyperenhancement identified in small bowel anterior left abdomen (image 46 series 2). As on the prior study, there is wall thickening and hyperenhancement in the ascending colon. Colon is diffusely decompressed. Vascular/Lymphatic: There is abdominal aortic atherosclerosis without aneurysm. There is no gastrohepatic or hepatoduodenal ligament lymphadenopathy. No intraperitoneal or retroperitoneal lymphadenopathy. No pelvic sidewall lymphadenopathy. Reproductive: Uterus surgically absent. Other: Ill-defined heterogeneously enhancing lesion is identified in the right posterior pelvis (image 50 series 2) measuring 3.8 x 4.4 cm. The ureter can be seen passing posterior to this suggesting that it is along the posterior peritoneal surface. Metastatic lesions suspected. Hematoma or abscess considered less  likely but not excluded. Musculoskeletal: Bone windows reveal no worrisome lytic or sclerotic osseous lesions. IMPRESSION: 1. Interval development 3.8 x 4.4 cm heterogeneously enhancing lesion posterior right pelvis suspicious for  metastatic involvement. This does have some low attenuation and hematoma or abscess would be considered less likely possibilities. Close follow-up recommended. 2. As on prior study, there are multiple areas of abnormal enhancement in the bowel of the left abdomen. Abnormal wall thickening and enhancement is seen in the terminal ileum and right colon as well. Imaging features suggest metastatic involvement. 3. Mild duodenal distention with edema in the retroperitoneal tissues around the duodenum. 4. Small volume intraperitoneal free fluid 5. Similar appearance of right greater than left hydronephrosis. Electronically Signed   By: Misty Stanley M.D.   On: 10/11/2017 17:28   Ct Abdomen Pelvis W Contrast  Result Date: 09/22/2017 CLINICAL DATA:  Abdominal distension and pain with bowel movements. History of breast cancer. EXAM: CT ABDOMEN AND PELVIS WITH CONTRAST TECHNIQUE: Multidetector CT imaging of the abdomen and pelvis was performed using the standard protocol following bolus administration of intravenous contrast. CONTRAST:  111m ISOVUE-300 IOPAMIDOL (ISOVUE-300) INJECTION 61% COMPARISON:  07/29/2017.  Head CT 03/07/2017 FINDINGS: Lower chest: Bilateral effusions right larger than left with dependent atelectasis. Hepatobiliary: No liver parenchymal abnormality is seen. No calcified gallstones. Pancreas: Normal Spleen: Normal Adrenals/Urinary Tract: Adrenal glands are normal. Mild fullness of both renal collecting systems and ureters with ureters being prominent all the way to the bladder. No bladder pathology is discernible. Stomach/Bowel: Chronically dilated stomach. Duodenum is also dilated and fluid filled. There is a very abnormal short segment of bowel at the duodenal jejunal junction which shows shouldering and contrast enhancement. The concern is that this could represent a small bowel tumor, either primary or metastatic. There is a second focus of abnormal small intestine probably in the ileum located  in the central right portion of the abdomen that also looks like tumor. This elevates the likelihood of metastatic disease, though unusual. The differential diagnosis would be inflammatory bowel disease of some sort. There probably a third area of involvement in the right colon near the ileocecal valve region. No sign of diverticulosis or diverticulitis. No other focal bowel finding. Vascular/Lymphatic: Aortic atherosclerosis. No aneurysm. IVC is normal. No retroperitoneal adenopathy. Reproductive: Previous hysterectomy.  No pelvic mass. Other: Moderate amount of free fluid in the pelvis, etiology uncertain. Musculoskeletal: Ordinary lumbar degenerative changes. Treated metastatic lesion of the L3 vertebral body no evidence of progressive change. IMPRESSION: Short-segment abnormal intestine at the duodenal jejunal junction with thickening and enhancement worrisome for primary tumor or metastatic tumor. There is a second focus of abnormal small intestine probably in the ileum in the mid to right portion of the abdomen that is also quite likely secondary to tumor, elevating the possibility of metastatic disease. Lastly, one could question thickening and enhancement in the right colon near the ileocecal valve that could be a third focus of involvement. The patient has a history of breast cancer and presumably this would be secondary to that process. However, melanoma can metastasis eyes to the intestine. Chronically dilated stomach and duodenum. New demonstration of mild fullness of the renal collecting systems and ureters, etiology unknown. No evidence of stone disease or obstructing bladder lesion. Small to moderate amount of free fluid in the pelvic cul de sac. Aortic atherosclerosis. Small bilateral pleural effusions with dependent atelectasis right more than left. Treated metastasis of the L3 vertebra. Electronically Signed   By: MJan FiremanD.  On: 09/22/2017 13:44   Dg Chest Port 1 View  Result Date:  10/11/2017 CLINICAL DATA:  Shortness of breath with fever and chills. EXAM: PORTABLE CHEST 1 VIEW COMPARISON:  09/21/2017 FINDINGS: 1535 hours. Lungs are hyperexpanded. The lungs are clear without focal pneumonia, edema, pneumothorax or pleural effusion. The cardiopericardial silhouette is within normal limits for size. Right Port-A-Cath is stable. The visualized bony structures of the thorax are intact. IMPRESSION: Stable.  No acute findings. Electronically Signed   By: Misty Stanley M.D.   On: 10/11/2017 15:56   Dg Chest Port 1 View  Result Date: 09/21/2017 CLINICAL DATA:  Difficulty breathing EXAM: PORTABLE CHEST 1 VIEW COMPARISON:  08/08/2017, 08/07/2017 FINDINGS: Right-sided central venous port tip overlies the proximal right atrium. No focal pulmonary infiltrate or effusion. Stable chronic interstitial opacities at the bases. Normal heart size. Aortic atherosclerosis. No pneumothorax. IMPRESSION: Stable scarring and chronic interstitial changes at the bases. No acute infiltrate or edema. Electronically Signed   By: Donavan Foil M.D.   On: 09/21/2017 03:43    Assessment:   MELISSIA LAHMAN is a 72 y.o. 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 S. gallolyticus is a group D strep and is classically associated with the presence of a bowel lesion leading to translocation of the bacteria.  There is a high rate of endocarditis with this organism.  Clinically she is improving. Per my review of oncology notes she has further treatment options of her widely metastatic cancer.  She will need evaluation for endocarditis and also consideration of removal of her portacath. There are no good guidelines on when to remove a portacath with strep bacteremia (unlike with Staph aureus), however given this bacteria's association with endocarditis I suspect it will have seeded the cath as  well.   Recommendations Will change to ceftriaxone for ease of dosing Check TTE If neg will need TEE to evaluate valves. Will need to discuss with oncology whether it is worth trying to treat through this infection while maintaining the portacath. Thank you very much for allowing me to participate in the care of this patient. Please call with questions.   Cheral Marker. Ola Spurr, MD

## 2017-10-14 NOTE — Progress Notes (Signed)
Sophia Nelson   DOB:June 09, 1946   AC#:[id number]    Subjective: Patient denies any fevers or chills overnight.  Appetite is fair.  She has been using a bedside commode as per the nursing staff.  No constipation or diarrhea.  Review of system: Continues to complain of generalized weakness.  Otherwise no headaches.  Objective:  Vitals:   10/14/17 1300 10/14/17 1400  BP: 118/66 127/75  Pulse: 80 84  Resp: 17 14  Temp:    SpO2: 100% 100%     Intake/Output Summary (Last 24 hours) at 10/14/2017 1600 Last data filed at 10/14/2017 1326 Gross per 24 hour  Intake 2973.33 ml  Output 1075 ml  Net 1898.33 ml    GENERAL Alert, no distress and comfortable.  Cachectic appearing resting in the bed. EYES: no pallor or icterus OROPHARYNX: no thrush or ulceration. NECK: supple, no masses felt LYMPH:  no palpable lymphadenopathy in the cervical, axillary or inguinal regions LUNGS: decreased breath sounds to auscultation at bases and  No wheeze or crackles HEART/CVS: regular rate & rhythm and no murmurs; No lower extremity edema ABDOMEN: abdomen soft, tender  on deep palpation. and normal bowel sounds Musculoskeletal:no cyanosis of digits and no clubbing  PSYCH: alert & oriented x 3 with fluent speech NEURO: no focal motor/sensory deficits SKIN:  no rashes or significant lesions   Labs:  Lab Results  Component Value Date   WBC 10.2 10/14/2017   HGB 8.4 (L) 10/14/2017   HCT 25.4 (L) 10/14/2017   MCV 85.7 10/14/2017   PLT 323 10/14/2017   NEUTROABS 7.3 (H) 10/14/2017    Lab Results  Component Value Date   NA 141 10/14/2017   K 3.0 (L) 10/14/2017   CL 110 10/14/2017   CO2 24 10/14/2017    Studies:  No results found.  Assessment & Plan:   # 72 year old female patient with a history of metastatic lobular cancer most recent on Taxol-currently admitted the hospital for sepsis/Streptococcus bacteremia.  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  CT  scan on admission shows progression of disease-small bowel/new worsening pelvic mass.  Discussed my concerns with the patient in detail; discussed my concerns of limited options for progressive malignancy in the context of her repeat admission to hospital/declining performance status.  one of the treatment options for the patient would be afiinitor + examestane; given her poor tolerance to systemic chemotherapy after acute infection issues resolve.   # Sepsis/bacteremia-question etiology.  Currently on IV antibiotics repeat cultures are negative.  Awaiting ID evaluation.  # Prognosis-guarded/overall poor. I discussed with patient's son; he agrees with plan.   [name], MD 10/14/2017  4:00 PM

## 2017-10-15 ENCOUNTER — Inpatient Hospital Stay: Payer: Medicare HMO

## 2017-10-15 ENCOUNTER — Inpatient Hospital Stay (HOSPITAL_COMMUNITY)
Admit: 2017-10-15 | Discharge: 2017-10-15 | Disposition: A | Discharge status: Home / Self Care | Payer: Medicare HMO | Attending: Infectious Diseases | Admitting: Infectious Diseases

## 2017-10-15 DIAGNOSIS — I361 Nonrheumatic tricuspid (valve) insufficiency: Secondary | ICD-10-CM

## 2017-10-15 LAB — BASIC METABOLIC PANEL
ANION GAP: 6 (ref 5–15)
BUN: 6 mg/dL (ref 6–20)
CHLORIDE: 108 mmol/L (ref 101–111)
CO2: 26 mmol/L (ref 22–32)
Calcium: 8.3 mg/dL — ABNORMAL LOW (ref 8.9–10.3)
Creatinine, Ser: 0.35 mg/dL — ABNORMAL LOW (ref 0.44–1.00)
GFR calc non Af Amer: >60 mL/min (ref >60)
Glucose, Bld: 79 mg/dL (ref 65–99)
Potassium: 3.4 mmol/L — ABNORMAL LOW (ref 3.5–5.1)
Sodium: 140 mmol/L (ref 135–145)

## 2017-10-15 LAB — CBC WITH DIFFERENTIAL/PLATELET
Basophils Absolute: 0.1 10*3/uL (ref 0–0.1)
Basophils Relative: 1 %
Eosinophils Absolute: 0.4 10*3/uL (ref 0–0.7)
Eosinophils Relative: 4 %
HEMATOCRIT: 25.1 % — AB (ref 35.0–47.0)
HEMOGLOBIN: 8.2 g/dL — AB (ref 12.0–16.0)
LYMPHS ABS: 1.3 10*3/uL (ref 1.0–3.6)
LYMPHS PCT: 13 %
MCH: 27.8 pg (ref 26.0–34.0)
MCHC: 32.8 g/dL (ref 32.0–36.0)
MCV: 84.7 fL (ref 80.0–100.0)
MONOS PCT: 15 %
Monocytes Absolute: 1.5 10*3/uL — ABNORMAL HIGH (ref 0.2–0.9)
NEUTROS ABS: 7 10*3/uL — AB (ref 1.4–6.5)
NEUTROS PCT: 67 %
Platelets: 314 10*3/uL (ref 150–440)
RBC: 2.96 MIL/uL — ABNORMAL LOW (ref 3.80–5.20)
RDW: 16.6 % — ABNORMAL HIGH (ref 11.5–14.5)
WBC: 10.3 10*3/uL (ref 3.6–11.0)

## 2017-10-15 LAB — ECHOCARDIOGRAM COMPLETE
Height: 62 in
Weight: 1802.48 oz

## 2017-10-15 MED ORDER — POTASSIUM CHLORIDE CRYS ER 20 MEQ PO TBCR
20.0000 meq | EXTENDED_RELEASE_TABLET | ORAL | Status: AC
  Administered 2017-10-15 (×2): 20 meq via ORAL
  Filled 2017-10-15 (×2): qty 1

## 2017-10-15 NOTE — Progress Notes (Signed)
Infectious Disease Long Term IV Antibiotic Orders Sophia Nelson 1946-03-11  Diagnosis: Strep Gallolyticus bacteremia and presumed portacath infection   Culture results Culture STREPTOCOCCUS GALLOLYTICUS Abnormal     Report Status 10/14/2017 FINAL   Organism ID, Bacteria STREPTOCOCCUS GALLOLYTICUS  Susceptibility   Streptococcus gallolyticus ([id number])   Antibiotic Interpretation Microscan Method Status  PENICILLIN Sensitive 0.12 SENSITIVE MIC Final  CEFTRIAXONE Sensitive 0.25 SENSITIVE MIC Final  ERYTHROMYCIN Sensitive <=0.12 SENSITIVE MIC Final  LEVOFLOXACIN Sensitive 2 SENSITIVE MIC Final  VANCOMYCIN Sensitive 0.25 SENSITIVE MIC Final  Susceptibility Comments   STREPTOCOCCUS GALLOLYTICUS         LABS Lab Results  Component Value Date   CREATININE 0.35 (L) 10/15/2017   Lab Results  Component Value Date   WBC 10.3 10/15/2017   HGB 8.2 (L) 10/15/2017   HCT 25.1 (L) 10/15/2017   MCV 84.7 10/15/2017   PLT 314 10/15/2017   No results found for: ESRSEDRATE, POCTSEDRATE No results found for: CRP  Allergies:  Allergies  Allergen Reactions  . Other Other (See Comments)    Onions(that grow in the yard-pt can eat onions without problems) and dust mite Sneezing, cough, runny nose    Discharge antibiotics Ceftriaxone 2 grams every      24         hours PICC Care per protocol Labs weekly while on IV antibiotics -FAX weekly labs to [phone] CBC w diff   LFT Cr   Planned duration of antibiotics 4 weeks from 2/18  Stop date 11/11/17  Follow up clinic date 3 weeks    Leonel Ramsay, MD

## 2017-10-15 NOTE — Progress Notes (Signed)
*  PRELIMINARY RESULTS* Echocardiogram 2D Echocardiogram has been performed.  Sophia Nelson 10/15/2017, 9:31 AM

## 2017-10-15 NOTE — Plan of Care (Signed)
Pt transferred to [facility] today. No concerns at this time

## 2017-10-15 NOTE — Progress Notes (Signed)
[facility] INFECTIOUS DISEASE PROGRESS NOTE Date of Admission:  10/11/2017     ID: Sophia Nelson is a 72 y.o. female with  Strep gallolyticus bacteremia, breast cancer Active Problems:   Sepsis ([place])   Protein-calorie malnutrition, severe   Subjective: No fevers, still weak.  Out of unit  ROS  Eleven systems are reviewed and negative except per hpi  Medications:  Antibiotics Given (last 72 hours)    Date/Time Action Medication Dose Rate   10/12/17 1736 New Bag/Given   vancomycin (VANCOCIN) IVPB 750 mg/150 ml premix 750 mg 150 mL/hr   10/12/17 2130 New Bag/Given   ceFEPIme (MAXIPIME) 2 g in sodium chloride 0.9 % 100 mL IVPB 2 g 200 mL/hr   10/13/17 0630 New Bag/Given   ceFEPIme (MAXIPIME) 2 g in sodium chloride 0.9 % 100 mL IVPB 2 g 200 mL/hr   10/13/17 1639 New Bag/Given   vancomycin (VANCOCIN) IVPB 750 mg/150 ml premix 750 mg 150 mL/hr   10/13/17 2119 New Bag/Given   ceFEPIme (MAXIPIME) 2 g in sodium chloride 0.9 % 100 mL IVPB 2 g 200 mL/hr   10/14/17 1003 New Bag/Given   ceFEPIme (MAXIPIME) 2 g in sodium chloride 0.9 % 100 mL IVPB 2 g 200 mL/hr   10/14/17 1111 New Bag/Given   vancomycin (VANCOCIN) IVPB 750 mg/150 ml premix 750 mg 150 mL/hr   10/14/17 2028 New Bag/Given   cefTRIAXone (ROCEPHIN) 2 g in sodium chloride 0.9 % 100 mL IVPB 2 g 200 mL/hr     . aspirin EC  81 mg Oral Daily  . atorvastatin  10 mg Oral Daily  . benzonatate  100 mg Oral TID  . calcium-vitamin D  1 tablet Oral TID  . enoxaparin (LOVENOX) injection  40 mg Subcutaneous Q24H  . feeding supplement (ENSURE ENLIVE)  237 mL Oral TID BM  . fluticasone  2 spray Each Nare Daily  . gabapentin  300 mg Oral BID  . loratadine  10 mg Oral Daily  . mouth rinse  15 mL Mouth Rinse BID  . mometasone-formoterol  2 puff Inhalation BID  . multivitamin with minerals  1 tablet Oral Daily  . pantoprazole sodium  20 mg Oral Daily  . sodium chloride flush  10-40 mL Intracatheter Q12H  . thiamine  100 mg Oral Daily   . tiotropium  18 mcg Inhalation Daily  . vitamin B-12  500 mcg Oral Daily    Objective: Vital signs in last 24 hours: Temp:  [97.8 F (36.6 C)-98.3 F (36.8 C)] 98.3 F (36.8 C) (02/19 1440) Pulse Rate:  [80-99] 81 (02/19 1440) Resp:  [14-21] 18 (02/19 1440) BP: (97-120)/(49-72) 97/49 (02/19 1440) SpO2:  [92 %-100 %] 100 % (02/19 1440) Constitutional:  Slowed mentation, interactive. Chronically ill appearing HENT: [place]/AT, PERRLA, pale no scleral icterus Mouth/Throat: Oropharynx is clear and dry . No oropharyngeal exudate.  Cardiovascular: Normal rate, regular rhythm and normal heart sounds. Distant  Pulmonary/Chest: Effort normal and breath sounds normal. No respiratory distress.  has no wheezes.  Neck = supple, no nuchal rigidity Abdominal: Soft. Bowel sounds are normal.  exhibits no distension. There is no tenderness.  Lymphadenopathy: no cervical adenopathy. No axillary adenopathy Neurological: alert and interactive but slowed mentation  Portacath site R chest wall wn, Skin: Skin is warm and dry. No rash noted. No erythema.  Psychiatric: a normal mood and affect.  behavior is normal.    Lab Results Recent Labs    10/14/17 0501 10/15/17 0445  WBC 10.2  10.3  HGB 8.4* 8.2*  HCT 25.4* 25.1*  NA 141 140  K 3.0* 3.4*  CL 110 108  CO2 24 26  BUN 6 6  CREATININE 0.38* 0.35*    Microbiology: Results for orders placed or performed during the hospital encounter of 10/11/17  Urine culture     Status: None   Collection Time: 10/11/17  3:22 PM  Result Value Ref Range Status   Specimen Description   Final    URINE, RANDOM Performed at [facility], [address] [place], [place] [address]    Special Requests   Final    NONE Performed at [facility], [address] [place], [place] [address]    Culture   Final    NO GROWTH Performed at [facility], [place] [address], [place], [place] [address]    Report Status 10/13/2017 FINAL  Final   Culture, blood (Routine x 2)     Status: Abnormal   Collection Time: 10/11/17  3:23 PM  Result Value Ref Range Status   Specimen Description   Final    BLOOD BLOOD LEFT FOREARM Performed at [facility], [address] [place], [place] [address]    Special Requests   Final    BOTTLES DRAWN AEROBIC AND ANAEROBIC Blood Culture adequate volume Performed at [facility], [address], [place], [place] [address]    Culture  Setup Time   Final    GRAM POSITIVE COCCI IN BOTH AEROBIC AND ANAEROBIC BOTTLES CRITICAL RESULT CALLED TO, READ BACK BY AND VERIFIED WITH: Obaloluwa Delatte BESANTI AT 7673 10/12/17.PMH Performed at [facility], [address] [place], [place] [address]    Culture (A)  Final    STREPTOCOCCUS GALLOLYTICUS SUSCEPTIBILITIES PERFORMED ON PREVIOUS CULTURE WITHIN THE LAST 5 DAYS. Performed at [facility], [place] [address], [place], [place] [address]    Report Status 10/14/2017 FINAL  Final  Culture, blood (Routine x 2)     Status: Abnormal   Collection Time: 10/11/17  3:23 PM  Result Value Ref Range Status   Specimen Description   Final    BLOOD PORTA CATH Performed at [facility], [address] [place], [place] [address]    Special Requests   Final    BOTTLES DRAWN AEROBIC AND ANAEROBIC Blood Culture results may not be optimal due to an excessive volume of blood received in culture bottles Performed at [facility], [address], [place], [place] [address]    Culture  Setup Time   Final    GRAM POSITIVE COCCI IN BOTH AEROBIC AND ANAEROBIC BOTTLES CRITICAL RESULT CALLED TO, READ BACK BY AND VERIFIED WITH: Jaken Fregia BESANTI AT 2683 10/12/17.PMH Performed at [facility], [place] [address], [place], [place] [address]    Culture STREPTOCOCCUS GALLOLYTICUS (A)  Final   Report Status 10/14/2017 FINAL  Final   Organism ID, Bacteria STREPTOCOCCUS GALLOLYTICUS  Final      Susceptibility   Streptococcus gallolyticus -  MIC*    PENICILLIN 0.12 SENSITIVE Sensitive     CEFTRIAXONE 0.25 SENSITIVE Sensitive     ERYTHROMYCIN <=0.12 SENSITIVE Sensitive     LEVOFLOXACIN 2 SENSITIVE Sensitive     VANCOMYCIN 0.25 SENSITIVE Sensitive     * STREPTOCOCCUS GALLOLYTICUS  Blood Culture ID Panel (Reflexed)     Status: Abnormal   Collection Time: 10/11/17  3:23 PM  Result Value Ref Range Status   Enterococcus species NOT DETECTED NOT DETECTED Final  Listeria monocytogenes NOT DETECTED NOT DETECTED Final   Staphylococcus species NOT DETECTED NOT DETECTED Final   Staphylococcus aureus NOT DETECTED NOT DETECTED Final   Streptococcus species DETECTED (A) NOT DETECTED Final    Comment: Not Enterococcus species, Streptococcus agalactiae, Streptococcus pyogenes, or Streptococcus pneumoniae. CRITICAL RESULT CALLED TO, READ BACK BY AND VERIFIED WITH: Kort Stettler BESANTI AT 5573 10/12/17.PMH    Streptococcus agalactiae NOT DETECTED NOT DETECTED Final   Streptococcus pneumoniae NOT DETECTED NOT DETECTED Final   Streptococcus pyogenes NOT DETECTED NOT DETECTED Final   Acinetobacter baumannii NOT DETECTED NOT DETECTED Final   Enterobacteriaceae species NOT DETECTED NOT DETECTED Final   Enterobacter cloacae complex NOT DETECTED NOT DETECTED Final   Escherichia coli NOT DETECTED NOT DETECTED Final   Klebsiella oxytoca NOT DETECTED NOT DETECTED Final   Klebsiella pneumoniae NOT DETECTED NOT DETECTED Final   Proteus species NOT DETECTED NOT DETECTED Final   Serratia marcescens NOT DETECTED NOT DETECTED Final   Haemophilus influenzae NOT DETECTED NOT DETECTED Final   Neisseria meningitidis NOT DETECTED NOT DETECTED Final   Pseudomonas aeruginosa NOT DETECTED NOT DETECTED Final   Candida albicans NOT DETECTED NOT DETECTED Final   Candida glabrata NOT DETECTED NOT DETECTED Final   Candida krusei NOT DETECTED NOT DETECTED Final   Candida parapsilosis NOT DETECTED NOT DETECTED Final   Candida tropicalis NOT DETECTED NOT DETECTED Final     Comment: Performed at [facility], [address] [place], [place] [address]  MRSA PCR Screening     Status: None   Collection Time: 10/12/17  2:23 PM  Result Value Ref Range Status   MRSA by PCR NEGATIVE NEGATIVE Final    Comment:        The GeneXpert MRSA Assay (FDA approved for NASAL specimens only), is one component of a comprehensive MRSA colonization surveillance program. It is not intended to diagnose MRSA infection nor to guide or monitor treatment for MRSA infections. Performed at [facility], [address] [place], [place] [address]   Culture, expectorated sputum-assessment     Status: None   Collection Time: 10/12/17  5:35 PM  Result Value Ref Range Status   Specimen Description EXPECTORATED SPUTUM  Final   Special Requests NONE  Final   Sputum evaluation   Final    Sputum specimen not acceptable for testing.  Please recollect.   C/TONI WALKER AT 0030 10/13/17.PMH Performed at [facility], [address] [place], [place] [address]    Report Status 10/13/2017 FINAL  Final  CULTURE, BLOOD (ROUTINE X 2) w Reflex to ID Panel     Status: None (Preliminary result)   Collection Time: 10/14/17  5:00 AM  Result Value Ref Range Status   Specimen Description BLOOD RIGHT ANTECUBITAL  Final   Special Requests   Final    BOTTLES DRAWN AEROBIC AND ANAEROBIC Blood Culture adequate volume   Culture   Final    NO GROWTH 1 DAY Performed at [facility], [address] [place], [place] [address]    Report Status PENDING  Incomplete  CULTURE, BLOOD (ROUTINE X 2) w Reflex to ID Panel     Status: None (Preliminary result)   Collection Time: 10/14/17  5:10 AM  Result Value Ref Range Status   Specimen Description BLOOD PORTA CATH  Final   Special Requests   Final    BOTTLES DRAWN AEROBIC AND ANAEROBIC Blood Culture adequate volume   Culture   Final    NO GROWTH 1 DAY  Performed at [facility], [address], [place],  [place] [address]    Report Status PENDING  Incomplete    Studies/Results: Dg Chest Port 1 View  Result Date: 10/15/2017 CLINICAL DATA:  Respiratory failure EXAM: PORTABLE CHEST 1 VIEW COMPARISON:  10/11/2017 FINDINGS: Right Port-A-Cath remains in place, unchanged. Layering small bilateral effusions, left greater than right with bibasilar atelectasis, left greater than right. Heart is upper limits normal in size. No acute bony abnormality. IMPRESSION: New small bilateral layering effusions and bibasilar atelectasis, left greater than right. Electronically Signed   By: Rolm Baptise M.D.   On: 10/15/2017 07:15    Assessment/Plan: ARYIANNA EARWOOD is a 72 y.o. 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 S. gallolyticus is a group D strep and is classically associated with the presence of a bowel lesion leading to translocation of the bacteria.  There is a high rate of endocarditis with this organism.  Clinically she is improving. Per my review of oncology notes she has further treatment options of her widely metastatic cancer.  She will need evaluation for endocarditis and also consideration of removal of her portacath. There are no good guidelines on when to remove a portacath with strep bacteremia (unlike with Staph aureus), however given this bacteria's association with endocarditis I suspect it will have seeded the cath as well. 2/19 - no fevers, tte negative for veg.    Recommendations Cont  Ceftriaxone.  I discussed with Dr Janeth Rase and given her overall poor status will defer TEE and try to preserve the portacath with a 4 week course of IV abx followed by suppressive abx as she is likely to continue to become bacteremic with her bowel mets.   Thank you very much for the consult. Will follow with you.  Leonel Ramsay   10/15/2017, 3:15 PM

## 2017-10-15 NOTE — Progress Notes (Signed)
Report called to Janett Billow, RN on [place]. Patient will be transported via wheelchair to room 222 when she finishes eating lunch.

## 2017-10-15 NOTE — Progress Notes (Signed)
   10/15/17 0950  Clinical Encounter Type  Visited With Patient  Visit Type Follow-up  Spiritual Encounters  Spiritual Needs Emotional   Chaplain followed up with patient regarding advanced directive.  Patient's son to visit this morning and would like chaplain to return later.  Chaplain offered emotional support to patient.

## 2017-10-15 NOTE — Progress Notes (Signed)
[facility] [place] NAME: Arryanna Holquin    MR#:  [id number]  DATE OF BIRTH:  March 14, 1946  SUBJECTIVE:  Admitted for sepsis.  Blood cultures returned with Streptococcus species.   No new complaints  REVIEW OF SYSTEMS:    Review of Systems  Constitutional: Positive for malaise/fatigue. Negative for chills, fever and weight loss.  HENT: Negative for ear discharge, ear pain and nosebleeds.   Eyes: Negative for blurred vision, pain and discharge.  Respiratory: Negative for sputum production, shortness of breath, wheezing and stridor.   Cardiovascular: Negative for chest pain, palpitations, orthopnea and PND.  Gastrointestinal: Negative for abdominal pain, diarrhea, nausea and vomiting.  Genitourinary: Negative for frequency and urgency.  Musculoskeletal: Negative for back pain and joint pain.  Neurological: Positive for weakness. Negative for sensory change, speech change and focal weakness.  Psychiatric/Behavioral: Negative for depression and hallucinations. The patient is not nervous/anxious.     DRUG ALLERGIES:   Allergies  Allergen Reactions  . Other Other (See Comments)    Onions(that grow in the yard-pt can eat onions without problems) and dust mite Sneezing, cough, runny nose    VITALS:  Blood pressure (!) 97/49, pulse 81, temperature 98.3 F (36.8 C), temperature source Oral, resp. rate 18, height 5\' 2"  (1.575 m), weight 51.1 kg (112 lb 10.5 oz), SpO2 100 %.  PHYSICAL EXAMINATION:   Physical Exam  GENERAL:  71 y.o.-year-old patient lying in the bed EYES: Pupils equal, round, reactive to light and accommodation.  HEENT: Head atraumatic, normocephalic. Oropharynx and nasopharynx clear. 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 No thyroid enlargement, no tenderness.  LUNGS: Bibasilar crackles. Right UE port + site looks ok. No tenderness CARDIOVASCULAR: S1, S2.  Tachycardic ABDOMEN: Soft, nontender,  nondistended. Bowel sounds present. No organomegaly or mass.  EXTREMITIES: No cyanosis, clubbing or edema b/l.    NEUROLOGIC: Cranial nerves II through XII are intact. No focal Motor or sensory deficits b/l.   PSYCHIATRIC:  patient is A and O x3  LABORATORY PANEL:   CBC Recent Labs  Lab 10/15/17 0445  WBC 10.3  HGB 8.2*  HCT 25.1*  PLT 314   ------------------------------------------------------------------------------------------------------------------ Chemistries  Recent Labs  Lab 10/11/17 1522  10/13/17 0446  10/15/17 0445  NA 140   < > 138   < > 140  K 2.9*   < > 3.4*   < > 3.4*  CL 98*   < > 109   < > 108  CO2 29   < > 26   < > 26  GLUCOSE 116*   < > 103*   < > 79  BUN <5*   < > 6   < > 6  CREATININE 0.45   < > 0.35*   < > 0.35*  CALCIUM 9.1   < > 7.4*   < > 8.3*  MG  --    < > 2.7*  --   --   AST 224*  --   --   --   --   ALT 44  --   --   --   --   ALKPHOS 390*  --   --   --   --   BILITOT 1.9*  --   --   --   --    < > = values in this interval not displayed.   ------------------------------------------------------------------------------------------------------------------  Cardiac Enzymes Recent Labs  Lab 10/13/17 0446  TROPONINI 0.39*   ------------------------------------------------------------------------------------------------------------------  RADIOLOGY:  Dg Chest Port 1 View  Result Date: 10/15/2017 CLINICAL DATA:  Respiratory failure EXAM: PORTABLE CHEST 1 VIEW COMPARISON:  10/11/2017 FINDINGS: Right Port-A-Cath remains in place, unchanged. Layering small bilateral effusions, left greater than right with bibasilar atelectasis, left greater than right. Heart is upper limits normal in size. No acute bony abnormality. IMPRESSION: New small bilateral layering effusions and bibasilar atelectasis, left greater than right. Electronically Signed   By: Rolm Baptise M.D.   On: 10/15/2017 07:15     ASSESSMENT AND PLAN:   72 year old female with past  medical history of breast cancer currently undergoing chemotherapy, hypertension, diabetes, COPD, previous history of pancreatitis, chronic back pain, previous history of gastric outlet obstruction who presents to the hospital due to fever, hypotension weakness lethargy and malaise.  * Septic shock secondary to Streptococcus bacteremia.  Etiology could be bowel with her abdominal mets around the bowel. -appreciate ID input---now on IV rocephin -Blood pressure much better today -TTEnegative for vegetation -?remove port---await  ID and Oncology recommendation  * Acute toxic metabolic encephalopathy. Improved  *Elevated troponin likely due to demand ischemia.  No significant elevation on repeat troponin check.  * Metastatic breast cancer.  Follows with cancer center as outpatient. -  Discussed with Dr. Smitty Pluck has poor performance status due to current illness and previous admissions.  * Anemia of chronic disease is stable  *DVT prophylaxis with Lovenox  PT to see pt All the records are reviewed and case discussed with Care Management/Social Worker Management plans discussed with the patient and they are in agreement.  CODE STATUS: FULL CODE  DVT Prophylaxis: SCDs  TOTAL CRITICAL CARE TIME TAKING CARE OF THIS PATIENT: 30 minutes.   Fritzi Mandes M.D on 10/15/2017 at 6:47 PM  Between 7am to 6pm - Pager - [phone]  After 6pm go to [email or web address] - password [email or web address] [facility]  Office  [phone]  CC: Primary care physician; Donnie Coffin, MD  Note: This dictation was prepared with Dragon dictation along with smaller phrase technology. Any transcriptional errors that result from this process are unintentional.

## 2017-10-16 NOTE — Progress Notes (Signed)
[facility] at [place] NAME: Sophia Nelson    MR#:  [id number]  DATE OF BIRTH:  Aug 16, 1946  SUBJECTIVE:  CHIEF COMPLAINT:   Chief Complaint  Patient presents with  . Code Sepsis  Patient without complaint, patient requesting inpatient rehab or skilled nursing facility-physical therapy to evaluate  REVIEW OF SYSTEMS:  CONSTITUTIONAL: No fever, fatigue or weakness.  EYES: No blurred or double vision.  EARS, NOSE, AND THROAT: No tinnitus or ear pain.  RESPIRATORY: No cough, shortness of breath, wheezing or hemoptysis.  CARDIOVASCULAR: No chest pain, orthopnea, edema.  GASTROINTESTINAL: No nausea, vomiting, diarrhea or abdominal pain.  GENITOURINARY: No dysuria, hematuria.  ENDOCRINE: No polyuria, nocturia,  HEMATOLOGY: No anemia, easy bruising or bleeding SKIN: No rash or lesion. MUSCULOSKELETAL: No joint pain or arthritis.   NEUROLOGIC: No tingling, numbness, weakness.  PSYCHIATRY: No anxiety or depression.   ROS  DRUG ALLERGIES:   Allergies  Allergen Reactions  . Other Other (See Comments)    Onions(that grow in the yard-pt can eat onions without problems) and dust mite Sneezing, cough, runny nose    VITALS:  Blood pressure 110/63, pulse 79, temperature 98.3 F (36.8 C), temperature source Oral, resp. rate 16, height 5\' 2"  (1.575 m), weight 51.1 kg (112 lb 10.5 oz), SpO2 99 %.  PHYSICAL EXAMINATION:  GENERAL:  72 y.o.-year-old patient lying in the bed with no acute distress.  EYES: Pupils equal, round, reactive to light and accommodation. No scleral icterus. Extraocular muscles intact.  HEENT: Head atraumatic, normocephalic. Oropharynx and nasopharynx clear.  NECK:  Supple, no jugular venous distention. No thyroid enlargement, no tenderness.  LUNGS: Normal breath sounds bilaterally, no wheezing, rales,rhonchi or crepitation. No use of accessory muscles of respiration.  CARDIOVASCULAR: S1, S2 normal. No murmurs, rubs, or gallops.   ABDOMEN: Soft, nontender, nondistended. Bowel sounds present. No organomegaly or mass.  EXTREMITIES: No pedal edema, cyanosis, or clubbing.  NEUROLOGIC: Cranial nerves II through XII are intact. Muscle strength 5/5 in all extremities. Sensation intact. Gait not checked.  PSYCHIATRIC: The patient is alert and oriented x 3.  SKIN: No obvious rash, lesion, or ulcer.   Physical Exam LABORATORY PANEL:   CBC Recent Labs  Lab 10/15/17 0445  WBC 10.3  HGB 8.2*  HCT 25.1*  PLT 314   ------------------------------------------------------------------------------------------------------------------  Chemistries  Recent Labs  Lab 10/11/17 1522  10/13/17 0446  10/15/17 0445  NA 140   < > 138   < > 140  K 2.9*   < > 3.4*   < > 3.4*  CL 98*   < > 109   < > 108  CO2 29   < > 26   < > 26  GLUCOSE 116*   < > 103*   < > 79  BUN <5*   < > 6   < > 6  CREATININE 0.45   < > 0.35*   < > 0.35*  CALCIUM 9.1   < > 7.4*   < > 8.3*  MG  --    < > 2.7*  --   --   AST 224*  --   --   --   --   ALT 44  --   --   --   --   ALKPHOS 390*  --   --   --   --   BILITOT 1.9*  --   --   --   --    < > =  values in this interval not displayed.   ------------------------------------------------------------------------------------------------------------------  Cardiac Enzymes Recent Labs  Lab 10/12/17 2248 10/13/17 0446  TROPONINI 0.37* 0.39*   ------------------------------------------------------------------------------------------------------------------  RADIOLOGY:  Dg Chest Port 1 View  Result Date: 10/15/2017 CLINICAL DATA:  Respiratory failure EXAM: PORTABLE CHEST 1 VIEW COMPARISON:  10/11/2017 FINDINGS: Right Port-A-Cath remains in place, unchanged. Layering small bilateral effusions, left greater than right with bibasilar atelectasis, left greater than right. Heart is upper limits normal in size. No acute bony abnormality. IMPRESSION: New small bilateral layering effusions and bibasilar  atelectasis, left greater than right. Electronically Signed   By: Rolm Baptise M.D.   On: 10/15/2017 07:15    ASSESSMENT AND PLAN:  72 year old female with past medical history of breast cancer currently undergoing chemotherapy, hypertension, diabetes, COPD, previous history of pancreatitis, chronic back pain, previous history of gastric outlet obstruction who presents to the hospital due to fever, hypotension weakness lethargy and malaise.  * Septic shock secondary to Streptococcus bacteremia Resolved Etiology could be bowel with her abdominal mets around the bowel Infectious disease following -appreciate ID input---now on IV Rocephin through November 11, 2017, TTEnegative for vegetation, per oncology/ID-no need to remove port, physical therapy to see as patient is requesting rehab/skilled nursing facility placement  * Acute toxic metabolic encephalopathy. Improved  *Elevated troponin likely due to demand ischemia.  No significant elevation on repeat troponin check.  * Metastatic breast cancer.  Follows with cancer center as outpatient. -  Discussed with Dr. Smitty Pluck has poor performance status due to current illness and previous admissions.  * Anemia of chronic disease is stable  *DVT prophylaxis with Lovenox    All the records are reviewed and case discussed with Care Management/Social Workerr. Management plans discussed with the patient, family and they are in agreement.  CODE STATUS: full  TOTAL TIME TAKING CARE OF THIS PATIENT: 35 minutes.     POSSIBLE D/C IN 1-2 DAYS, DEPENDING ON CLINICAL CONDITION.   Avel Peace Salary M.D on 10/16/2017   Between 7am to 6pm - Pager - [phone]  After 6pm go to [email or web address] - password [email or web address] [facility]  Office  [phone]  CC: Primary care physician; Donnie Coffin, MD  Note: This dictation was prepared with Dragon dictation along with smaller phrase technology. Any transcriptional errors  that result from this process are unintentional.

## 2017-10-16 NOTE — Progress Notes (Signed)
Sophia Nelson   DOB:[birth year]   ZO#:[id number]    Subjective: Patient denies any fevers or chills overnight.  Appetite is fair.   No constipation or diarrhea. She had been walking to the bathroom.   Review of system: Continues to complain of generalized weakness.  Otherwise no headaches.  Objective:  Vitals:   10/16/17 0421 10/16/17 0515  BP: 106/74 (!) 115/59  Pulse: 85 85  Resp: 14   Temp: 98.5 F (36.9 C) 98.1 F (36.7 C)  SpO2: 100% 98%     Intake/Output Summary (Last 24 hours) at 10/16/2017 9811 Last data filed at 10/16/2017 0300 Gross per 24 hour  Intake 0 ml  Output -  Net 0 ml    GENERAL Alert, no distress and comfortable.  Cachectic appearing resting in the bed. EYES: no pallor or icterus OROPHARYNX: no thrush or ulceration. NECK: supple, no masses felt LYMPH:  no palpable lymphadenopathy in the cervical, axillary or inguinal regions LUNGS: decreased breath sounds to auscultation at bases and  No wheeze or crackles HEART/CVS: regular rate & rhythm and no murmurs; No lower extremity edema ABDOMEN: abdomen soft, tender  on deep palpation. and normal bowel sounds Musculoskeletal:no cyanosis of digits and no clubbing  PSYCH: alert & oriented x 3 with fluent speech NEURO: no focal motor/sensory deficits SKIN:  no rashes or significant lesions    Labs:  Lab Results  Component Value Date   WBC 10.3 10/15/2017   HGB 8.2 (L) 10/15/2017   HCT 25.1 (L) 10/15/2017   MCV 84.7 10/15/2017   PLT 314 10/15/2017   NEUTROABS 7.0 (H) 10/15/2017    Lab Results  Component Value Date   NA 140 10/15/2017   K 3.4 (L) 10/15/2017   CL 108 10/15/2017   CO2 26 10/15/2017    Studies:  Dg Chest Port 1 View  Result Date: 10/15/2017 CLINICAL DATA:  Respiratory failure EXAM: PORTABLE CHEST 1 VIEW COMPARISON:  10/11/2017 FINDINGS: Right Port-A-Cath remains in place, unchanged. Layering small bilateral effusions, left greater than right with bibasilar atelectasis, left greater than  right. Heart is upper limits normal in size. No acute bony abnormality. IMPRESSION: New small bilateral layering effusions and bibasilar atelectasis, left greater than right. Electronically Signed   By: Rolm Baptise M.D.   On: 10/15/2017 07:15    Assessment & Plan:   # 72 year old female patient with a history of metastatic lobular cancer most recent on Taxol-currently admitted the hospital for sepsis/Streptococcus bacteremia.  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  February 2019-    Progression noted on CT scan.  Taxol discontinued.   One of the treatment options for the patient would be afiinitor + examestane; given her poor tolerance to systemic chemotherapy after acute infection issues resolve.   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  Reviewed ID recommendations-long-term IV antibiotics.  Hold off TEE; keep the port in for now [patient has morbid fear of needles].  I discussed with the patient she agrees. Discussed with Dr. Ola Spurr.  #Overall prognosis is poor/given the declining performance status/multiple patient in the hospital.  I discussed this with the patient and son.   Sophia Sickle, MD 10/16/2017  9:42 AM

## 2017-10-16 NOTE — Progress Notes (Signed)
PT Cancellation Note  Patient Details Name: Sophia Nelson MRN: [id number] DOB: 04/04/1946   Cancelled Treatment:    Reason Eval/Treat Not Completed: Other (comment). Consult received and chart reviewed. Per notes, pt has been ambulatory in room on 2L of O2 with independence, Pt confirmed. At baseline she uses SPC occasionally, reports no falls and has supports at home in place. Reports she feels at baseline and no need for PT intervention. Refused ambulation at this time. Will dc in house as no apparent needs. RN notified. Please re consult if needs change.   Narjis Mira 10/16/2017, 11:08 AM  Greggory Stallion, PT, DPT [phone]

## 2017-10-16 NOTE — Progress Notes (Signed)
PHARMACY CONSULT NOTE FOR:  OUTPATIENT  PARENTERAL ANTIBIOTIC THERAPY (OPAT)  Indication: strep gallolyticus bacteremia and presumed portacath infection Regimen: ceftriaxone 2 gm IV Q24H End date: 11/11/17  Labs weekly while on IV antibiotics: CBC with dif, LFT, SCr - fax to [phone]  IV antibiotic discharge orders are pended. To discharging provider:  please sign these orders via discharge navigator,  Select New Orders & click on the button choice - Manage This Unsigned Work.     Thank you for allowing pharmacy to be a part of this patient's care.  Laural Benes, Pharm.D., BCPS Clinical Pharmacist 10/16/2017, 3:29 PM

## 2017-10-16 NOTE — Progress Notes (Signed)
[place] INFECTIOUS DISEASE PROGRESS NOTE Date of Admission:  10/11/2017     ID: Sophia Nelson is a 72 y.o. female with  Strep gallolyticus bacteremia, breast cancer Active Problems:   Sepsis ([place])   Protein-calorie malnutrition, severe   Subjective: No fevers, still weak.  Out of unit  ROS  Eleven systems are reviewed and negative except per hpi  Medications:  Antibiotics Given (last 72 hours)    Date/Time Action Medication Dose Rate   10/13/17 1639 New Bag/Given   vancomycin (VANCOCIN) IVPB 750 mg/150 ml premix 750 mg 150 mL/hr   10/13/17 2119 New Bag/Given   ceFEPIme (MAXIPIME) 2 g in sodium chloride 0.9 % 100 mL IVPB 2 g 200 mL/hr   10/14/17 1003 New Bag/Given   ceFEPIme (MAXIPIME) 2 g in sodium chloride 0.9 % 100 mL IVPB 2 g 200 mL/hr   10/14/17 1111 New Bag/Given   vancomycin (VANCOCIN) IVPB 750 mg/150 ml premix 750 mg 150 mL/hr   10/14/17 2028 New Bag/Given   cefTRIAXone (ROCEPHIN) 2 g in sodium chloride 0.9 % 100 mL IVPB 2 g 200 mL/hr     . aspirin EC  81 mg Oral Daily  . atorvastatin  10 mg Oral Daily  . benzonatate  100 mg Oral TID  . calcium-vitamin D  1 tablet Oral TID  . enoxaparin (LOVENOX) injection  40 mg Subcutaneous Q24H  . feeding supplement (ENSURE ENLIVE)  237 mL Oral TID BM  . fluticasone  2 spray Each Nare Daily  . gabapentin  300 mg Oral BID  . loratadine  10 mg Oral Daily  . mouth rinse  15 mL Mouth Rinse BID  . mometasone-formoterol  2 puff Inhalation BID  . multivitamin with minerals  1 tablet Oral Daily  . pantoprazole sodium  20 mg Oral Daily  . sodium chloride flush  10-40 mL Intracatheter Q12H  . thiamine  100 mg Oral Daily  . tiotropium  18 mcg Inhalation Daily  . vitamin B-12  500 mcg Oral Daily    Objective: Vital signs in last 24 hours: Temp:  [98.1 F (36.7 C)-98.7 F (37.1 C)] 98.3 F (36.8 C) (02/20 1501) Pulse Rate:  [79-86] 79 (02/20 1501) Resp:  [14-18] 16 (02/20 1501) BP: (106-115)/(59-74) 110/63 (02/20  1501) SpO2:  [98 %-100 %] 99 % (02/20 1501) Constitutional:  Slowed mentation, interactive. Chronically ill appearing HENT: [place]/AT, PERRLA, pale no scleral icterus Mouth/Throat: Oropharynx is clear and dry . No oropharyngeal exudate.  Cardiovascular: Normal rate, regular rhythm and normal heart sounds. Distant  Pulmonary/Chest: Effort normal and breath sounds normal. No respiratory distress.  has no wheezes.  Neck = supple, no nuchal rigidity Abdominal: Soft. Bowel sounds are normal.  exhibits no distension. There is no tenderness.  Lymphadenopathy: no cervical adenopathy. No axillary adenopathy Neurological: alert and interactive but slowed mentation  Portacath site R chest wall wn, Skin: Skin is warm and dry. No rash noted. No erythema.  Psychiatric: a normal mood and affect.  behavior is normal.    Lab Results Recent Labs    10/14/17 0501 10/15/17 0445  WBC 10.2 10.3  HGB 8.4* 8.2*  HCT 25.4* 25.1*  NA 141 140  K 3.0* 3.4*  CL 110 108  CO2 24 26  BUN 6 6  CREATININE 0.38* 0.35*    Microbiology: Results for orders placed or performed during the hospital encounter of 10/11/17  Urine culture     Status: None   Collection Time: 10/11/17  3:22 PM  Result Value Ref Range Status   Specimen Description   Final    URINE, RANDOM Performed at [facility], [address], [place], [place] [address]    Special Requests   Final    NONE Performed at [facility], [address] [place], [place] [address]    Culture   Final    NO GROWTH Performed at [facility], [place] [address], [place], [place] [address]    Report Status 10/13/2017 FINAL  Final  Culture, blood (Routine x 2)     Status: Abnormal   Collection Time: 10/11/17  3:23 PM  Result Value Ref Range Status   Specimen Description   Final    BLOOD BLOOD LEFT FOREARM Performed at [facility], [address] [place], [place] [address]    Special Requests   Final    BOTTLES DRAWN  AEROBIC AND ANAEROBIC Blood Culture adequate volume Performed at [facility], [address], [place], [place] [address]    Culture  Setup Time   Final    GRAM POSITIVE COCCI IN BOTH AEROBIC AND ANAEROBIC BOTTLES CRITICAL RESULT CALLED TO, READ BACK BY AND VERIFIED WITH: Kmarion Rawl BESANTI AT 7517 10/12/17.PMH Performed at [facility], [address] [place], [place] [address]    Culture (A)  Final    STREPTOCOCCUS GALLOLYTICUS SUSCEPTIBILITIES PERFORMED ON PREVIOUS CULTURE WITHIN THE LAST 5 DAYS. Performed at [facility], [place] [address] [place], [place] [address]    Report Status 10/14/2017 FINAL  Final  Culture, blood (Routine x 2)     Status: Abnormal   Collection Time: 10/11/17  3:23 PM  Result Value Ref Range Status   Specimen Description   Final    BLOOD PORTA CATH Performed at [facility], [address] [place], [place] [address]    Special Requests   Final    BOTTLES DRAWN AEROBIC AND ANAEROBIC Blood Culture results may not be optimal due to an excessive volume of blood received in culture bottles Performed at [facility], [address], [place], [place] [address]    Culture  Setup Time   Final    GRAM POSITIVE COCCI IN BOTH AEROBIC AND ANAEROBIC BOTTLES CRITICAL RESULT CALLED TO, READ BACK BY AND VERIFIED WITH: Dreana Britz BESANTI AT 5993 10/12/17.PMH Performed at [facility], [place] [address] [place], [place] [address]    Culture STREPTOCOCCUS GALLOLYTICUS (A)  Final   Report Status 10/14/2017 FINAL  Final   Organism ID, Bacteria STREPTOCOCCUS GALLOLYTICUS  Final      Susceptibility   Streptococcus gallolyticus - MIC*    PENICILLIN 0.12 SENSITIVE Sensitive     CEFTRIAXONE 0.25 SENSITIVE Sensitive     ERYTHROMYCIN <=0.12 SENSITIVE Sensitive     LEVOFLOXACIN 2 SENSITIVE Sensitive     VANCOMYCIN 0.25 SENSITIVE Sensitive     * STREPTOCOCCUS GALLOLYTICUS  Blood Culture ID Panel (Reflexed)     Status: Abnormal    Collection Time: 10/11/17  3:23 PM  Result Value Ref Range Status   Enterococcus species NOT DETECTED NOT DETECTED Final   Listeria monocytogenes NOT DETECTED NOT DETECTED Final   Staphylococcus species NOT DETECTED NOT DETECTED Final   Staphylococcus aureus NOT DETECTED NOT DETECTED Final   Streptococcus species DETECTED (A) NOT DETECTED Final    Comment: Not Enterococcus species, Streptococcus agalactiae, Streptococcus pyogenes, or Streptococcus pneumoniae. CRITICAL RESULT CALLED TO, READ BACK BY AND VERIFIED WITH: Chauntelle Azpeitia BESANTI AT 7939 10/12/17.PMH  Streptococcus agalactiae NOT DETECTED NOT DETECTED Final   Streptococcus pneumoniae NOT DETECTED NOT DETECTED Final   Streptococcus pyogenes NOT DETECTED NOT DETECTED Final   Acinetobacter baumannii NOT DETECTED NOT DETECTED Final   Enterobacteriaceae species NOT DETECTED NOT DETECTED Final   Enterobacter cloacae complex NOT DETECTED NOT DETECTED Final   Escherichia coli NOT DETECTED NOT DETECTED Final   Klebsiella oxytoca NOT DETECTED NOT DETECTED Final   Klebsiella pneumoniae NOT DETECTED NOT DETECTED Final   Proteus species NOT DETECTED NOT DETECTED Final   Serratia marcescens NOT DETECTED NOT DETECTED Final   Haemophilus influenzae NOT DETECTED NOT DETECTED Final   Neisseria meningitidis NOT DETECTED NOT DETECTED Final   Pseudomonas aeruginosa NOT DETECTED NOT DETECTED Final   Candida albicans NOT DETECTED NOT DETECTED Final   Candida glabrata NOT DETECTED NOT DETECTED Final   Candida krusei NOT DETECTED NOT DETECTED Final   Candida parapsilosis NOT DETECTED NOT DETECTED Final   Candida tropicalis NOT DETECTED NOT DETECTED Final    Comment: Performed at [facility], [address] [place], [place] [address]  MRSA PCR Screening     Status: None   Collection Time: 10/12/17  2:23 PM  Result Value Ref Range Status   MRSA by PCR NEGATIVE NEGATIVE Final    Comment:        The GeneXpert MRSA Assay (FDA approved for NASAL  specimens only), is one component of a comprehensive MRSA colonization surveillance program. It is not intended to diagnose MRSA infection nor to guide or monitor treatment for MRSA infections. Performed at [facility], [address] [place], [place] [address]   Culture, expectorated sputum-assessment     Status: None   Collection Time: 10/12/17  5:35 PM  Result Value Ref Range Status   Specimen Description EXPECTORATED SPUTUM  Final   Special Requests NONE  Final   Sputum evaluation   Final    Sputum specimen not acceptable for testing.  Please recollect.   C/TONI WALKER AT 0030 10/13/17.PMH Performed at [facility], [address] [place], [place] [address]    Report Status 10/13/2017 FINAL  Final  CULTURE, BLOOD (ROUTINE X 2) w Reflex to ID Panel     Status: None (Preliminary result)   Collection Time: 10/14/17  5:00 AM  Result Value Ref Range Status   Specimen Description BLOOD RIGHT ANTECUBITAL  Final   Special Requests   Final    BOTTLES DRAWN AEROBIC AND ANAEROBIC Blood Culture adequate volume   Culture   Final    NO GROWTH 2 DAYS Performed at [facility], [address], [place], [place] [address]    Report Status PENDING  Incomplete  CULTURE, BLOOD (ROUTINE X 2) w Reflex to ID Panel     Status: None (Preliminary result)   Collection Time: 10/14/17  5:10 AM  Result Value Ref Range Status   Specimen Description BLOOD PORTA CATH  Final   Special Requests   Final    BOTTLES DRAWN AEROBIC AND ANAEROBIC Blood Culture adequate volume   Culture   Final    NO GROWTH 2 DAYS Performed at [facility], [address], [place], [place] [address]    Report Status PENDING  Incomplete    Studies/Results: Dg Chest Port 1 View  Result Date: 10/15/2017 CLINICAL DATA:  Respiratory failure EXAM: PORTABLE CHEST 1 VIEW COMPARISON:  10/11/2017 FINDINGS: Right Port-A-Cath remains in place, unchanged. Layering small bilateral effusions, left  greater than right with bibasilar atelectasis, left  greater than right. Heart is upper limits normal in size. No acute bony abnormality. IMPRESSION: New small bilateral layering effusions and bibasilar atelectasis, left greater than right. Electronically Signed   By: Rolm Baptise M.D.   On: 10/15/2017 07:15    Assessment/Plan: ELADIA FRAME is a 72 y.o. 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 S. gallolyticus is a group D strep and is classically associated with the presence of a bowel lesion leading to translocation of the bacteria.  There is a high rate of endocarditis with this organism.  Clinically she is improving. Per my review of oncology notes she has further treatment options of her widely metastatic cancer.  She will need evaluation for endocarditis and also consideration of removal of her portacath. There are no good guidelines on when to remove a portacath with strep bacteremia (unlike with Staph aureus), however given this bacteria's association with endocarditis I suspect it will have seeded the cath as well. 2/19 - no fevers, tte negative for veg.  2/20- no fevers, walking more.   Recommendations Cont  Ceftriaxone. See IV abx order sheet I discussed with Dr Janeth Rase and given her overall poor status will defer TEE and try to preserve the portacath with a 4 week course of IV abx followed by suppressive abx as she is likely to continue to become bacteremic with her bowel mets.  Thank you very much for the consult. Will follow with you.  Leonel Ramsay   10/16/2017, 3:17 PM

## 2017-10-16 NOTE — Plan of Care (Signed)
Pt is progressing. No concerns at this time

## 2017-10-17 MED ORDER — CEFTRIAXONE IV (FOR PTA / DISCHARGE USE ONLY): 2.0000 g | INTRAVENOUS | 0 refills | Status: DC

## 2017-10-17 MED ORDER — THIAMINE HCL 100 MG PO TABS: 100.0000 mg | ORAL_TABLET | Freq: Every day | ORAL | 0 refills | Status: AC

## 2017-10-17 MED ORDER — SODIUM CHLORIDE 0.9 % IV SOLN: 2.0000 g | INTRAVENOUS | 0 refills | Status: DC

## 2017-10-17 MED ORDER — CYANOCOBALAMIN 500 MCG PO TABS: 500.0000 ug | ORAL_TABLET | Freq: Every day | ORAL | 0 refills | Status: AC

## 2017-10-17 MED ORDER — TIOTROPIUM BROMIDE MONOHYDRATE 18 MCG IN CAPS: 18.0000 ug | ORAL_CAPSULE | Freq: Every day | RESPIRATORY_TRACT | 12 refills | Status: AC

## 2017-10-17 NOTE — Care Management (Signed)
Patient admitted for septic shock.  Patient lives at home alone.  Patient states that her son provides transportation if needed.  PCP [place].  Patient is independent at baseline, and has a SPC in the home if needed. Patient to discharge on home IV antibiotics via her port.  Patient provided with home health agency preference.  Patient states that she does not have a preference.  Referral made to [facility] with [place]. Patient to receive dose today while in hospital, with start of care tomorrow with [facility].  RNCM signing off.

## 2017-10-17 NOTE — Discharge Summary (Signed)
[facility] [place] NAME: Sophia Nelson    MR#:  [id number]  DATE OF BIRTH:  [birth year]  DATE OF ADMISSION:  10/11/2017 ADMITTING PHYSICIAN: Henreitta Leber, MD  DATE OF DISCHARGE: No discharge date for patient encounter.  PRIMARY CARE PHYSICIAN: Aycock, Ngwe A, MD    ADMISSION DIAGNOSIS:  Sepsis, due to unspecified organism ([place]) [A41.9]  DISCHARGE DIAGNOSIS:  Active Problems:   Sepsis ([place])   Protein-calorie malnutrition, severe   SECONDARY DIAGNOSIS:   Past Medical History:  Diagnosis Date  . Abdominal distention   . AKI (acute kidney injury) ([place]) 02/24/2017  . Anemia   . Arthritis   . Asthma   . Back pain, chronic 03/15/2017  . Breast cancer ([place]) 2009   RT MASTECTOMY, DCIS  . 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 Cancer of left breast ([place]) 08/09/2015   T4 N1 M1 tumor, INVASIVE LOBULAR CARCINOMA.  . Carcinoma of overlapping sites of left breast in female, estrogen receptor positive ([place]) 03/22/2016  . COPD (chronic obstructive pulmonary disease) ([place])   . Diabetes ([place]) 02/24/2017  . Diabetes mellitus without complication ([place])   . Duodenal obstruction   . Encounter for nasogastric (NG) tube placement   . Gastric outlet obstruction 02/24/2017  . GERD (gastroesophageal reflux disease)   . Headache    h/o migraines as a child  . Hypertension   . Neck pain 04/19/2016  . Pancreatitis, acute 03/04/2017  . Pneumonia 2015  . Recurrent low back pain 03/15/2017  . Shortness of breath dyspnea    with exertion    HOSPITAL COURSE:  72 year old female with past medical history of breast cancer currently undergoing chemotherapy, hypertension, diabetes, COPD, previous history of pancreatitis, chronic back pain, previous history of gastric outlet obstruction who presents to the hospital due to fever, hypotension weakness lethargy and  malaise.  * Septic shock secondary to Streptococcus bacteremia Resolved Etiology could be bowel with her abdominal mets around the bowel Infectious disease did see patient while in house -  IV Rocephin through November 11, 2017, TTEnegative for vegetation, per oncology/ID-no need to remove port, physical therapy did see patient while in house-no needs identified, patient be discharged home with home health services  * Acute toxic metabolic encephalopathy. Most likely secondary to above Resolved  *Elevated troponin Most likely secondary to above from demand ischemia No significant elevation on repeat troponin check  * Metastatic breast cancer Discussed with Dr. Smitty Pluck has poor performance status due to current illness and previous admissions, to follow-up status post discharge for continued care/management  * Anemia of chronic disease Stable DISCHARGE CONDITIONS:  On day of discharge patient is afebrile, hemodynamically stable, tolerating diet, ready for discharge to home with appropriate follow-up with oncology, primary care provider, and infectious disease, for more specific details please see chart  CONSULTS OBTAINED:  Treatment Team:  Sindy Guadeloupe, MD Lafayette Dragon, MD Leonel Ramsay, MD  DRUG ALLERGIES:   Allergies  Allergen Reactions  . Other Other (See Comments)    Onions(that grow in the yard-pt can eat onions without problems) and dust mite Sneezing, cough, runny nose    DISCHARGE MEDICATIONS:   Allergies as of 10/17/2017      Reactions   Other Other (See Comments)   Onions(that grow in the yard-pt can eat onions without problems) and dust mite Sneezing, cough, runny nose  Medication List    TAKE these medications   ADVAIR DISKUS 500-50 MCG/DOSE Aepb Generic drug:  Fluticasone-Salmeterol Inhale 1 puff into the lungs 2 (two) times daily.   albuterol 108 (90 Base) MCG/ACT inhaler Commonly known as:  PROVENTIL HFA;VENTOLIN  HFA Inhale 2 puffs into the lungs every 6 (six) hours as needed for wheezing or shortness of breath.   aspirin EC 81 MG tablet Take 81 mg by mouth daily.   atorvastatin 10 MG tablet Commonly known as:  LIPITOR Take 10 mg by mouth every evening.   cefTRIAXone 2 g in sodium chloride 0.9 % 100 mL Inject 2 g into the vein daily.   cefTRIAXone IVPB Commonly known as:  ROCEPHIN Inject 2 g into the vein daily. Indication:  Streptococcus gallolyticus bacteremia and presumed portacath infection Last Day of Therapy:  11/11/17 Labs - Once weekly:  CBC/D and BMP, Labs - Every other week:  ESR and CRP   cyanocobalamin 500 MCG tablet Take 1 tablet (500 mcg total) by mouth daily. Start taking on:  10/18/2017   docusate sodium 100 MG capsule Commonly known as:  COLACE Take 100 mg by mouth 2 (two) times daily.   feeding supplement (ENSURE ENLIVE) Liqd Take 237 mLs by mouth 3 (three) times daily between meals.   fluticasone 50 MCG/ACT nasal spray Commonly known as:  FLONASE Place 2 sprays into both nostrils daily.   gabapentin 300 MG capsule Commonly known as:  NEURONTIN Take 300 mg by mouth 2 (two) times daily.   lidocaine-prilocaine cream Commonly known as:  EMLA Apply 1 application topically as needed (for port access).   loperamide 2 MG capsule Commonly known as:  IMODIUM One pill after each loose stool; maximum upto 8 pills a day. What changed:    how much to take  how to take this  when to take this  reasons to take this  additional instructions   loratadine 10 MG tablet Commonly known as:  CLARITIN Take 10 mg by mouth daily.   Magnesium Cl-Calcium Carbonate 70-117 MG Tbec Commonly known as:  SLOW MAGNESIUM/CALCIUM Take 1 tablet by mouth 2 (two) times daily.   metoCLOPramide 10 MG tablet Commonly known as:  REGLAN Take 1 tablet (10 mg total) by mouth 4 (four) times daily.   montelukast 10 MG tablet Commonly known as:  SINGULAIR Take 10 mg by mouth at  bedtime.   morphine 30 MG 12 hr tablet Commonly known as:  MS CONTIN Take 1 tablet (30 mg total) by mouth every 12 (twelve) hours.   omeprazole 20 MG capsule Commonly known as:  PRILOSEC Take 20 mg by mouth every morning.   ondansetron 8 MG disintegrating tablet Commonly known as:  ZOFRAN ODT Take 1 tablet (8 mg total) by mouth every 8 (eight) hours as needed for nausea or vomiting.   oxyCODONE-acetaminophen 5-325 MG tablet Commonly known as:  PERCOCET/ROXICET Take 1 tablet by mouth every 8 (eight) hours as needed for severe pain.   potassium chloride SA 20 MEQ tablet Commonly known as:  K-DUR,KLOR-CON 1 pill twice a day What changed:    how much to take  how to take this  when to take this  additional instructions   prochlorperazine 10 MG tablet Commonly known as:  COMPAZINE Take 1 tablet (10 mg total) by mouth every 6 (six) hours as needed for nausea or vomiting.   senna-docusate 8.6-50 MG tablet Commonly known as:  Senokot-S Take 1 tablet by mouth at bedtime as needed for mild  constipation.   thiamine 100 MG tablet Take 1 tablet (100 mg total) by mouth daily. Start taking on:  10/18/2017   tiotropium 18 MCG inhalation capsule Commonly known as:  SPIRIVA Place 1 capsule (18 mcg total) into inhaler and inhale daily. Start taking on:  10/18/2017            Home Infusion Instuctions  (From admission, onward)        Start     Ordered   10/17/17 0000  Home infusion instructions [facility] May follow [facility] Dosing Protocol; May administer Cathflo as needed to maintain patency of vascular access device.; Flushing of vascular access device: per [facility] Protocol: 0.9% NaCl pre/post medica...    Question Answer Comment  Instructions May follow [facility] Dosing Protocol   Instructions May administer Cathflo as needed to maintain patency of vascular access device.   Instructions Flushing of vascular access device: per [facility] Protocol: 0.9% NaCl pre/post  medication administration and prn patency; Heparin 100 u/ml, 58m for implanted ports and Heparin 10u/ml, 546mfor all other central venous catheters.   Instructions May follow [facility] Anaphylaxis Protocol for First Dose Administration in the home: 0.9% NaCl at 25-50 ml/hr to maintain IV access for protocol meds. Epinephrine 0.3 ml IV/IM PRN and Benadryl 25-50 IV/IM PRN s/s of anaphylaxis.   Instructions [facility] Infusion Coordinator (RN) to assist per patient IV care needs in the home PRN.      10/17/17 1212       DISCHARGE INSTRUCTIONS:   If you experience worsening of your admission symptoms, develop shortness of breath, life threatening emergency, suicidal or homicidal thoughts you must seek medical attention immediately by calling 911 or calling your MD immediately  if symptoms less severe.  You Must read complete instructions/literature along with all the possible adverse reactions/side effects for all the Medicines you take and that have been prescribed to you. Take any new Medicines after you have completely understood and accept all the possible adverse reactions/side effects.   Please note  You were cared for by a hospitalist during your hospital stay. If you have any questions about your discharge medications or the care you received while you were in the hospital after you are discharged, you can call the unit and asked to speak with the hospitalist on call if the hospitalist that took care of you is not available. Once you are discharged, your primary care physician will handle any further medical issues. Please note that NO REFILLS for any discharge medications will be authorized once you are discharged, as it is imperative that you return to your primary care physician (or establish a relationship with a primary care physician if you do not have one) for your aftercare needs so that they can reassess your need for medications and monitor your lab values.    Today   CHIEF  COMPLAINT:   Chief Complaint  Patient presents with  . Code Sepsis    HISTORY OF PRESENT ILLNESS:  [age].o. female with a known history of breast cancer currently undergoing treatment, COPD, osteoarthritis, chronic back pain, diabetes, hypertension, previous history of pancreatitis, previous history of gastric outlet obstruction who presents to the hospital from home due to weakness, malaise, lethargy and noted to have a fever of 101 here in the ER. Patient herself is a very poor historian and there is no family around in the room and therefore most of the history obtained from the chart and from the ER physician.  Patient apparently is currently undergoing treatment for breast cancer and showed up to her chemotherapy session this morning and was noted to be hypotensive, febrile and therefore sent to the ER for further evaluation. A code sepsis was initiated given her high fever, leukocytosis and high relative hypotension. After getting some IV fluids in the ER patient's hypotension has now resolved. She continues to be lethargic, weak and the source of his sepsis remains unclear. Hospitalist services were contacted further treatment and evaluation. VITAL SIGNS:  Blood pressure 133/67, pulse 83, temperature 98.1 F (36.7 C), temperature source Oral, resp. rate 17, height _0  (1.575 m), weight 51.1 kg (112 lb 10.5 oz), SpO2 100 %.  I/O:    Intake/Output Summary (Last 24 hours) at 10/17/2017 1217 Last data filed at 10/17/2017 1108 Gross per 24 hour  Intake 610 ml  Output 360 ml  Net 250 ml    PHYSICAL EXAMINATION:  GENERAL:  72 y.o.-year-old patient lying in the bed with no acute distress.  EYES: Pupils equal, round, reactive to light and accommodation. No scleral icterus. Extraocular muscles intact.  HEENT: Head atraumatic, normocephalic. Oropharynx and nasopharynx clear.  NECK:  Supple, no jugular venous distention. No thyroid enlargement, no tenderness.  LUNGS: Normal breath sounds  bilaterally, no wheezing, rales,rhonchi or crepitation. No use of accessory muscles of respiration.  CARDIOVASCULAR: S1, S2 normal. No murmurs, rubs, or gallops.  ABDOMEN: Soft, non-tender, non-distended. Bowel sounds present. No organomegaly or mass.  EXTREMITIES: No pedal edema, cyanosis, or clubbing.  NEUROLOGIC: Cranial nerves II through XII are intact. Muscle strength 5/5 in all extremities. Sensation intact. Gait not checked.  PSYCHIATRIC: The patient is alert and oriented x 3.  SKIN: No obvious rash, lesion, or ulcer.   DATA REVIEW:   CBC Recent Labs  Lab 10/15/17 0445  WBC 10.3  HGB 8.2*  HCT 25.1*  PLT 314    Chemistries  Recent Labs  Lab 10/11/17 1522  10/13/17 0446  10/15/17 0445  NA 140   < > 138   < > 140  K 2.9*   < > 3.4*   < > 3.4*  CL 98*   < > 109   < > 108  CO2 29   < > 26   < > 26  GLUCOSE 116*   < > 103*   < > 79  BUN <5*   < > 6   < > 6  CREATININE 0.45   < > 0.35*   < > 0.35*  CALCIUM 9.1   < > 7.4*   < > 8.3*  MG  --    < > 2.7*  --   --   AST 224*  --   --   --   --   ALT 44  --   --   --   --   ALKPHOS 390*  --   --   --   --   BILITOT 1.9*  --   --   --   --    < > = values in this interval not displayed.    Cardiac Enzymes Recent Labs  Lab 10/13/17 0446  TROPONINI 0.39*    Microbiology Results  Results for orders placed or performed during the hospital encounter of 10/11/17  Urine culture     Status: None   Collection Time: 10/11/17  3:22 PM  Result Value Ref Range Status   Specimen Description   Final    URINE, RANDOM Performed at [facility], [address], [place], [place] [address]    Special Requests   Final    NONE Performed at [facility], [address], [place], [place] [address]    Culture   Final    NO GROWTH Performed at [facility], [place] [address] [place], [place] [address]    Report Status 10/13/2017 FINAL  Final  Culture, blood (Routine x 2)     Status: Abnormal   Collection  Time: 10/11/17  3:23 PM  Result Value Ref Range Status   Specimen Description   Final    BLOOD BLOOD LEFT FOREARM Performed at [facility], [address] [place], [place] [address]    Special Requests   Final    BOTTLES DRAWN AEROBIC AND ANAEROBIC Blood Culture adequate volume Performed at [facility], [address], [place], [place] [address]    Culture  Setup Time   Final    GRAM POSITIVE COCCI IN BOTH AEROBIC AND ANAEROBIC BOTTLES CRITICAL RESULT CALLED TO, READ BACK BY AND VERIFIED WITH: DAVID BESANTI AT 8280 10/12/17.PMH Performed at [facility], [address] [place], [place] [address]    Culture (A)  Final    STREPTOCOCCUS GALLOLYTICUS SUSCEPTIBILITIES PERFORMED ON PREVIOUS CULTURE WITHIN THE LAST 5 DAYS. Performed at [facility], [place] [address], [place], [place] [address]    Report Status 10/14/2017 FINAL  Final  Culture, blood (Routine x 2)     Status: Abnormal   Collection Time: 10/11/17  3:23 PM  Result Value Ref Range Status   Specimen Description   Final    BLOOD PORTA CATH Performed at [facility], [address] [place], [place] [address]    Special Requests   Final    BOTTLES DRAWN AEROBIC AND ANAEROBIC Blood Culture results may not be optimal due to an excessive volume of blood received in culture bottles Performed at [facility], [address], [place], [place] [address]    Culture  Setup Time   Final    GRAM POSITIVE COCCI IN BOTH AEROBIC AND ANAEROBIC BOTTLES CRITICAL RESULT CALLED TO, READ BACK BY AND VERIFIED WITH: DAVID BESANTI AT 5537 10/12/17.PMH Performed at [facility], [place] [address], [place],  [address]    Culture STREPTOCOCCUS GALLOLYTICUS (A)  Final   Report Status 10/14/2017 FINAL  Final   Organism ID, Bacteria STREPTOCOCCUS GALLOLYTICUS  Final      Susceptibility   Streptococcus gallolyticus - MIC*    PENICILLIN 0.12 SENSITIVE Sensitive     CEFTRIAXONE 0.25  SENSITIVE Sensitive     ERYTHROMYCIN <=0.12 SENSITIVE Sensitive     LEVOFLOXACIN 2 SENSITIVE Sensitive     VANCOMYCIN 0.25 SENSITIVE Sensitive     * STREPTOCOCCUS GALLOLYTICUS  Blood Culture ID Panel (Reflexed)     Status: Abnormal   Collection Time: 10/11/17  3:23 PM  Result Value Ref Range Status   Enterococcus species NOT DETECTED NOT DETECTED Final   Listeria monocytogenes NOT DETECTED NOT DETECTED Final   Staphylococcus species NOT DETECTED NOT DETECTED Final   Staphylococcus aureus NOT DETECTED NOT DETECTED Final   Streptococcus species DETECTED (A) NOT DETECTED Final    Comment: Not Enterococcus species, Streptococcus agalactiae, Streptococcus pyogenes, or Streptococcus pneumoniae. CRITICAL RESULT CALLED TO, READ BACK BY AND VERIFIED WITH: DAVID BESANTI AT 7867 10/12/17.PMH    Streptococcus agalactiae NOT DETECTED NOT DETECTED Final   Streptococcus pneumoniae NOT DETECTED NOT DETECTED Final   Streptococcus pyogenes NOT  DETECTED NOT DETECTED Final   Acinetobacter baumannii NOT DETECTED NOT DETECTED Final   Enterobacteriaceae species NOT DETECTED NOT DETECTED Final   Enterobacter cloacae complex NOT DETECTED NOT DETECTED Final   Escherichia coli NOT DETECTED NOT DETECTED Final   Klebsiella oxytoca NOT DETECTED NOT DETECTED Final   Klebsiella pneumoniae NOT DETECTED NOT DETECTED Final   Proteus species NOT DETECTED NOT DETECTED Final   Serratia marcescens NOT DETECTED NOT DETECTED Final   Haemophilus influenzae NOT DETECTED NOT DETECTED Final   Neisseria meningitidis NOT DETECTED NOT DETECTED Final   Pseudomonas aeruginosa NOT DETECTED NOT DETECTED Final   Candida albicans NOT DETECTED NOT DETECTED Final   Candida glabrata NOT DETECTED NOT DETECTED Final   Candida krusei NOT DETECTED NOT DETECTED Final   Candida parapsilosis NOT DETECTED NOT DETECTED Final   Candida tropicalis NOT DETECTED NOT DETECTED Final    Comment: Performed at [facility], [address]  [place], [place] [address]  MRSA PCR Screening     Status: None   Collection Time: 10/12/17  2:23 PM  Result Value Ref Range Status   MRSA by PCR NEGATIVE NEGATIVE Final    Comment:        The GeneXpert MRSA Assay (FDA approved for NASAL specimens only), is one component of a comprehensive MRSA colonization surveillance program. It is not intended to diagnose MRSA infection nor to guide or monitor treatment for MRSA infections. Performed at [facility], [address] [place], [place] [address]   Culture, expectorated sputum-assessment     Status: None   Collection Time: 10/12/17  5:35 PM  Result Value Ref Range Status   Specimen Description EXPECTORATED SPUTUM  Final   Special Requests NONE  Final   Sputum evaluation   Final    Sputum specimen not acceptable for testing.  Please recollect.   C/TONI WALKER AT 0030 10/13/17.PMH Performed at [facility], [address] [place], [place] [address]    Report Status 10/13/2017 FINAL  Final  CULTURE, BLOOD (ROUTINE X 2) w Reflex to ID Panel     Status: None (Preliminary result)   Collection Time: 10/14/17  5:00 AM  Result Value Ref Range Status   Specimen Description BLOOD RIGHT ANTECUBITAL  Final   Special Requests   Final    BOTTLES DRAWN AEROBIC AND ANAEROBIC Blood Culture adequate volume   Culture   Final    NO GROWTH 3 DAYS Performed at [facility], [address] [place], [place] [address]    Report Status PENDING  Incomplete  CULTURE, BLOOD (ROUTINE X 2) w Reflex to ID Panel     Status: None (Preliminary result)   Collection Time: 10/14/17  5:10 AM  Result Value Ref Range Status   Specimen Description BLOOD PORTA CATH  Final   Special Requests   Final    BOTTLES DRAWN AEROBIC AND ANAEROBIC Blood Culture adequate volume   Culture   Final    NO GROWTH 3 DAYS Performed at [facility], [address], [place], [place] [address]    Report Status PENDING  Incomplete    RADIOLOGY:   No results found.  EKG:   Orders placed or performed during the hospital encounter of 10/11/17  . ED EKG 12-Lead  . ED EKG 12-Lead      Management plans discussed with the patient, family and they are in agreement.  CODE STATUS:     Code Status Orders  (From admission, onward)  Start     Ordered   10/11/17 2104  Full code  Continuous     10/11/17 2103    Code Status History    Date Active Date Inactive Code Status Order ID Comments User Context   09/21/2017 06:33 09/23/2017 17:17 Full Code [id number]  Harrie Foreman, MD Inpatient   08/08/2017 01:34 08/10/2017 00:29 Full Code [id number]  Lance Coon, MD Inpatient   06/09/2017 03:31 06/17/2017 17:15 Full Code [id number]  Harvie Bridge, DO ED   03/04/2017 01:23 03/13/2017 20:56 Full Code [id number]  Harvie Bridge, DO Inpatient   02/24/2017 23:40 02/26/2017 17:06 Full Code [id number]  Lance Coon, MD Inpatient      TOTAL TIME TAKING CARE OF THIS PATIENT: 45 minutes.    Avel Peace Minas Bonser M.D on 10/17/2017 at 12:17 PM  Between 7am to 6pm - Pager - [phone]  After 6pm go to [email or web address] - password [email or web address] [facility]  Office  [phone]  CC: Primary care physician; Donnie Coffin, MD   Note: This dictation was prepared with Dragon dictation along with smaller phrase technology. Any transcriptional errors that result from this process are unintentional.

## 2017-10-17 NOTE — Progress Notes (Signed)
Discharge teaching given to patient, patient verbalized understanding and had no questions.Patient will be transported home by family. All patient belongings gathered prior to leaving.

## 2017-10-17 NOTE — Care Management Important Message (Signed)
Important Message  Patient Details  Name: Sophia Nelson MRN: [id number] Date of Birth: Nov 15, 1945   Medicare Important Message Given:  Yes    Beverly Sessions, RN 10/17/2017, 1:32 PM

## 2017-10-18 ENCOUNTER — Ambulatory Visit: Payer: Medicare HMO

## 2017-10-18 ENCOUNTER — Other Ambulatory Visit: Payer: Medicare HMO

## 2017-10-18 ENCOUNTER — Ambulatory Visit: Payer: Medicare HMO | Admitting: Internal Medicine

## 2017-10-19 LAB — CULTURE, BLOOD (ROUTINE X 2)
Culture: NO GROWTH
Culture: NO GROWTH
SPECIAL REQUESTS: ADEQUATE
SPECIAL REQUESTS: ADEQUATE

## 2017-10-28 ENCOUNTER — Telehealth: Payer: Self-pay | Admitting: Internal Medicine

## 2017-10-28 ENCOUNTER — Other Ambulatory Visit: Payer: Self-pay

## 2017-10-28 ENCOUNTER — Inpatient Hospital Stay: Payer: Medicare HMO | Attending: Internal Medicine | Admitting: Internal Medicine

## 2017-10-28 ENCOUNTER — Telehealth: Payer: Self-pay | Admitting: Pharmacist

## 2017-10-28 ENCOUNTER — Encounter: Payer: Self-pay | Admitting: Internal Medicine

## 2017-10-28 DIAGNOSIS — C50812 Malignant neoplasm of overlapping sites of left female breast: Secondary | ICD-10-CM

## 2017-10-28 DIAGNOSIS — Z170 Estrogen receptor positive status [ER+]: Secondary | ICD-10-CM | POA: Insufficient documentation

## 2017-10-28 DIAGNOSIS — G893 Neoplasm related pain (acute) (chronic): Secondary | ICD-10-CM | POA: Diagnosis not present

## 2017-10-28 DIAGNOSIS — C7951 Secondary malignant neoplasm of bone: Secondary | ICD-10-CM | POA: Diagnosis not present

## 2017-10-28 DIAGNOSIS — B955 Unspecified streptococcus as the cause of diseases classified elsewhere: Secondary | ICD-10-CM | POA: Insufficient documentation

## 2017-10-28 DIAGNOSIS — A419 Sepsis, unspecified organism: Secondary | ICD-10-CM | POA: Insufficient documentation

## 2017-10-28 DIAGNOSIS — D649 Anemia, unspecified: Secondary | ICD-10-CM | POA: Insufficient documentation

## 2017-10-28 MED ORDER — EXEMESTANE 25 MG PO TABS: 25.0000 mg | ORAL_TABLET | Freq: Every day | ORAL | 6 refills | Status: DC

## 2017-10-28 MED ORDER — EVEROLIMUS 5 MG PO TABS: 5.0000 mg | ORAL_TABLET | Freq: Every day | ORAL | 2 refills | Status: DC

## 2017-10-28 NOTE — Telephone Encounter (Signed)
Oral Oncology Patient Advocate Encounter  Was successful in securing patient an $ 8,000.00 grant from [facility] [place] (PAF) to provide copayment coverage for her Afinitor.  This will keep the out of pocket expense at $0.    I have spoken with the patient.    The billing information is as follows and has been shared with [place].   Member ID: [id number] Group ID: [id number] RxBin: [id number] PCN: [id number] Dates of Eligibility: 05/01/2017 through 10/29/2018  [place] Patient Advocate [phone] 10/28/2017 4:38 PM

## 2017-10-28 NOTE — Progress Notes (Signed)
[place] OFFICE PROGRESS NOTE  Patient Care Team: Donnie Coffin, MD as PCP - General (Family Medicine) Donnie Coffin, MD as Referring Physician (Family Medicine) Christene Lye, MD (General Surgery) Forest Gleason, MD (Oncology) [name], MD as Consulting Physician (Internal Medicine)  Cancer of left breast [facility])   Staging form: Breast, AJCC 7th Edition     Clinical: Stage IIIB (T4, N1, cM0(i+)) - Signed by Forest Gleason, MD on 08/09/2015     Pathologic: Stage IV (T4, N1, M1) - Signed by Forest Gleason, MD on 08/16/2015    Oncology History   # DEC 2016-  LEFT BREAST CA- LOBULAR CA ER/PR-Pos; her 2 NEG STAGE IV [G2E3M6- left breast/bil Ax LN/Media LN/RP LN; skeletal mets]; DEC 2016- LETROZOLE + IBRANCE; April 2017- Significant response to treatment- [Dr.Sankar]; s/p Left mastec & ALND [palliative]; cont Ibrance + Letrzole. Rickard [facility] 2018/sec to Anemia]  # July 2018- PROGRESSION [PET scan-L1 lytic lesion; Small bowel extrinsic compression s/p lap resection + Lobular cancer; Dr.Davis ]  # AUG 9th th-FASLODEX; SEP 1 week- Verzinio 150 BID;STOP clinical progression [DEC 2018 CT- no obvious mass noted]  # Dec 7th Taxol weekly; Feb 2019- Progression CT [pelvic mass]; Strep sepsis  # march 2019- Aromasin + afinitor  # AUG 2018- s/p RT to L3 lytic lesion  # RIGHT BREAST CA [s/p mastectomy [facility]]  -------------------------------------------------    1.) Retrocolic retrogastric Roux-en-Y (gastrojejunostomy) bypass of distal duodenal obstruction (cpt: 43820) 2.) Segmental jejunal small bowel resection for accessible tumor pathology (cpt: 29476)  INTRAOPERATIVE FINDINGS: Large firm irregular mass encasing duodenum at approximately the ligament of Treitz with diffuse tumor implant studding along much of small intestinal omentum and serosa  # [employer] One- 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  She is currently on IV antibiotics for 2 more weeks.  Imaging including a CT scan done in the hospital showed increasing size of the pelvic mass-suggestive of progression of her cancer.  She denies any significant nausea vomiting or constipation.  Intermittent diarrhea.  REVIEW OF SYSTEMS:  A complete 10 point review of system is done which is negative except mentioned above/history of present illness.   PAST MEDICAL HISTORY :  Past Medical History:  Diagnosis Date  . Abdominal distention   . AKI (acute kidney injury) ([place]) 02/24/2017  . Anemia   . Arthritis   . Asthma   . Back pain, chronic 03/15/2017  . Breast cancer ([place]) 2009   RT MASTECTOMY, DCIS  . Cancer of intra-abdominal ([place])    Metastatic breast cancer lobular carcinoma wth recurrence in the abdomen status post resection  . Cancer of left breast ([place]) 08/09/2015   T4 N1 M1 tumor, INVASIVE LOBULAR CARCINOMA.  . Carcinoma of overlapping sites of left breast in female, estrogen receptor positive ([place]) 03/22/2016  . COPD (chronic obstructive pulmonary disease) ([place])   . Diabetes ([place]) 02/24/2017  . Diabetes mellitus without complication ([place])   . Duodenal obstruction   . Encounter for nasogastric (NG) tube placement   .  Gastric outlet obstruction 02/24/2017  . GERD (gastroesophageal reflux disease)   . Headache    h/o migraines as a child  . Hypertension   . Neck pain 04/19/2016  . Pancreatitis, acute 03/04/2017  . Pneumonia 2015  . Recurrent low back pain 03/15/2017  . Shortness of breath dyspnea    with exertion     PAST SURGICAL HISTORY :   Past Surgical History:  Procedure Laterality Date  . ABDOMINAL HYSTERECTOMY    . BREAST SURGERY Right 2015   mastectomy  . ESOPHAGOGASTRODUODENOSCOPY N/A 02/25/2017   Procedure: ESOPHAGOGASTRODUODENOSCOPY (EGD);  Surgeon: Jonathon Bellows, MD;  Location: [facility] ENDOSCOPY;  Service: Endoscopy;  Laterality: N/A;  . ESOPHAGOGASTRODUODENOSCOPY (EGD) WITH PROPOFOL N/A 03/06/2017   Procedure: ESOPHAGOGASTRODUODENOSCOPY (EGD) WITH PROPOFOL;  Surgeon: [name], MD;  Location: [facility] ENDOSCOPY;  Service: Endoscopy;  Laterality: N/A;  . GASTROJEJUNOSTOMY N/A 03/08/2017   Procedure: Roux-en-Y gastrojejunostomy Bypass of Gastric Outlet Obstruction, small bowel resection;  Surgeon: Vickie Epley, MD;  Location: [facility] ORS;  Service: General;  Laterality: N/A;  . PORTACATH PLACEMENT Right 08/01/2017   Procedure: INSERTION PORT-A-CATH;  Surgeon: Christene Lye, MD;  Location: [facility] ORS;  Service: General;  Laterality: Right;  . SIMPLE MASTECTOMY WITH AXILLARY SENTINEL NODE BIOPSY Left 02/29/2016   Procedure: SIMPLE MASTECTOMY;  Surgeon: Christene Lye, MD;  Location: [facility] ORS;  Service: General;  Laterality: Left;  . TUBAL LIGATION      FAMILY HISTORY :   Family History  Problem Relation Age of Onset  . Lung cancer Father   . Cancer Maternal Aunt   . Dementia Mother   . Breast cancer Neg Hx     SOCIAL HISTORY:   Social History   Tobacco Use  . Smoking status: Former Smoker    Packs/day: 0.25    Years: 15.00    Pack years: 3.75    Types: Cigarettes    Last attempt to quit: 07/23/2017    Years since quitting: 0.2  . Smokeless tobacco: Never Used  . Tobacco comment: currently as of 02-21-16 pt states she is only smoking 2-3 cigarettes per day  Substance Use Topics  . 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 Drug use: No    ALLERGIES:   is allergic to other.  MEDICATIONS:  Current Outpatient Medications  Medication Sig Dispense Refill  . ADVAIR DISKUS 500-50 MCG/DOSE AEPB Inhale 1 puff into the lungs 2 (two) times daily.     [name] albuterol (PROVENTIL HFA;VENTOLIN HFA) 108 (90 Base) MCG/ACT inhaler Inhale 2 puffs into the lungs every 6 (six) hours as needed for wheezing or shortness of breath.    [name] aspirin EC 81 MG tablet Take 81 mg by mouth daily.    [name] atorvastatin (LIPITOR) 10 MG tablet Take 10 mg by mouth every evening.     . cefTRIAXone (ROCEPHIN) IVPB Inject 2 g into the vein daily. Indication:  Streptococcus gallolyticus bacteremia and presumed portacath infection Last Day of Therapy:  11/11/17 Labs - Once weekly:  CBC/D and BMP, Labs - Every other week:  ESR and CRP 27 Units 0  . docusate sodium (COLACE) 100 MG capsule Take 100 mg by mouth 2 (two) times daily.    . feeding supplement, ENSURE ENLIVE, (ENSURE ENLIVE) LIQD Take 237 mLs by mouth 3 (three) times daily between meals. 237 mL 12  .  fluticasone (FLONASE) 50 MCG/ACT nasal spray Place 2 sprays into both nostrils daily.    [name] gabapentin (NEURONTIN) 300 MG capsule Take 300 mg by mouth 2 (two) times daily.    [name] lidocaine-prilocaine (EMLA) cream Apply 1 application topically as needed (for port access).     [name] loratadine (CLARITIN) 10 MG tablet Take 10 mg by mouth daily.    . Magnesium Cl-Calcium Carbonate (SLOW MAGNESIUM/CALCIUM) 70-117 MG TBEC Take 1 tablet by mouth 2 (two) times daily. 60 tablet 6  . metoCLOPramide (REGLAN) 10 MG tablet Take 1 tablet (10 mg total) by mouth 4 (four) times daily. 360 tablet 0  . montelukast (SINGULAIR) 10 MG tablet Take 10 mg by mouth at bedtime.    [name] morphine (MS CONTIN) 30 MG 12 hr tablet Take 1 tablet (30 mg total) by mouth every 12 (twelve) hours. 60 tablet 0  . omeprazole (PRILOSEC) 20 MG capsule Take 20 mg by mouth every morning.     [name] oxyCODONE-acetaminophen (PERCOCET/ROXICET) 5-325 MG tablet Take 1 tablet by mouth every 8 (eight)  hours as needed for severe pain. 90 tablet 0  . potassium chloride SA (K-DUR,KLOR-CON) 20 MEQ tablet 1 pill twice a day (Patient taking differently: Take 20 mEq by mouth daily. ) 180 tablet 0  . thiamine 100 MG tablet Take 1 tablet (100 mg total) by mouth daily. 90 tablet 0  . tiotropium (SPIRIVA) 18 MCG inhalation capsule Place 1 capsule (18 mcg total) into inhaler and inhale daily. 30 capsule 12  . vitamin B-12 500 MCG tablet Take 1 tablet (500 mcg total) by mouth daily. 90 tablet 0  . cefTRIAXone 2 g in sodium chloride 0.9 % 100 mL Inject 2 g into the vein daily. (Patient not taking: Reported on 10/28/2017) 26 Units 0  . everolimus (AFINITOR) 5 MG tablet Take 1 tablet (5 mg total) by mouth daily. 30 tablet 2  . exemestane (AROMASIN) 25 MG tablet Take 1 tablet (25 mg total) by mouth daily after breakfast. 30 tablet 6  . loperamide (IMODIUM) 2 MG capsule One pill after each loose stool; maximum upto 8 pills a day. (Patient not taking: Reported on 10/28/2017) 40 capsule 0  . ondansetron (ZOFRAN ODT) 8 MG disintegrating tablet Take 1 tablet (8 mg total) by mouth every 8 (eight) hours as needed for nausea or vomiting. (Patient not taking: Reported on 10/28/2017) 20 tablet 3  . prochlorperazine (COMPAZINE) 10 MG tablet Take 1 tablet (10 mg total) by mouth every 6 (six) hours as needed for nausea or vomiting. (Patient not taking: Reported on 10/28/2017) 30 tablet 3  . senna-docusate (SENOKOT-S) 8.6-50 MG tablet Take 1 tablet by mouth at bedtime as needed for mild constipation. (Patient not taking: Reported on 10/28/2017)     No current facility-administered medications for this visit.     PHYSICAL EXAMINATION: ECOG PERFORMANCE STATUS: 0 - Asymptomatic  SpO2 100%   There were no vitals filed for this visit.  GENERAL: Moderately nourished well-developed; Alert, no distress and comfortable. She is in a wheelchair. EYES: no pallor or icterus OROPHARYNX: no thrush or ulceration; poor dentition.   NECK:  supple, no masses felt LYMPH:  no palpable lymphadenopathy in the cervical, axillary or inguinal regions LUNGS: clear to auscultation and  No wheeze or crackles HEART/CVS: regular rate & rhythm and no murmurs; No lower extremity edema ABDOMEN:abdomen soft, non-tender and normal bowel sounds Musculoskeletal:no cyanosis of digits and no clubbing;  PSYCH: alert & oriented x 3 with fluent speech NEURO: no  focal motor/sensory deficits SKIN:  no rashes or significant lesions   LABORATORY DATA:  I have reviewed the data as listed    Component Value Date/Time   NA 140 10/15/2017 0445   NA 133 (L) 04/08/2014 1738   K 3.4 (L) 10/15/2017 0445   K 4.2 04/08/2014 1738   CL 108 10/15/2017 0445   CL 99 04/08/2014 1738   CO2 26 10/15/2017 0445   CO2 28 04/08/2014 1738   GLUCOSE 79 10/15/2017 0445   GLUCOSE 116 (H) 04/08/2014 1738   BUN 6 10/15/2017 0445   BUN 5 (L) 04/08/2014 1738   CREATININE 0.35 (L) 10/15/2017 0445   CREATININE 0.42 (L) 04/08/2014 1738   CALCIUM 8.3 (L) 10/15/2017 0445   CALCIUM 4.9 06/09/2017 1441   PROT 6.2 (L) 10/11/2017 1522   ALBUMIN 2.7 (L) 10/11/2017 1522   AST 224 (H) 10/11/2017 1522   ALT 44 10/11/2017 1522   ALKPHOS 390 (H) 10/11/2017 1522   BILITOT 1.9 (H) 10/11/2017 1522   GFRNONAA >60 10/15/2017 0445   GFRNONAA >60 04/08/2014 1738   GFRAA >60 10/15/2017 0445   GFRAA >60 04/08/2014 1738    No results found for: SPEP, UPEP  Lab Results  Component Value Date   WBC 10.3 10/15/2017   NEUTROABS 7.0 (H) 10/15/2017   HGB 8.2 (L) 10/15/2017   HCT 25.1 (L) 10/15/2017   MCV 84.7 10/15/2017   PLT 314 10/15/2017      Chemistry      Component Value Date/Time   NA 140 10/15/2017 0445   NA 133 (L) 04/08/2014 1738   K 3.4 (L) 10/15/2017 0445   K 4.2 04/08/2014 1738   CL 108 10/15/2017 0445   CL 99 04/08/2014 1738   CO2 26 10/15/2017 0445   CO2 28 04/08/2014 1738   BUN 6 10/15/2017 0445   BUN 5 (L) 04/08/2014 1738   CREATININE 0.35 (L) 10/15/2017  0445   CREATININE 0.42 (L) 04/08/2014 1738      Component Value Date/Time   CALCIUM 8.3 (L) 10/15/2017 0445   CALCIUM 4.9 06/09/2017 1441   ALKPHOS 390 (H) 10/11/2017 1522   AST 224 (H) 10/11/2017 1522   ALT 44 10/11/2017 1522   BILITOT 1.9 (H) 10/11/2017 1522       RADIOGRAPHIC STUDIES: I have personally reviewed the radiological images as listed and agreed with the findings in the report. No results found.   ASSESSMENT & PLAN:  Carcinoma of overlapping sites of left breast in female, estrogen receptor positive ([place]) # Metastatic breast cancer/lobular ER/PR positive HER-2/neu negative-most recently on Taxol-noted to have progression on the recent CT scan in February 2019 with increasing pelvic mass.  # Recommend Aromasin plus Afinitor the next line Of therapy.  Recommend starting Afinitor in approximately 2 weeks; after finishing antibiotics [see discussion below].  Patient understands treatments are palliative not curative.  Discussed with pharmacy.    #Discussed the potential side effects of Afinitor including but not limited to diarrhea low blood counts pneumonitis endocrine abnormalities skin rash and low blood counts.  Also discussed regarding mucositis/dexamethasone mouthwash.   #Abdominal pain-secondary to malignancy continue-MS Contin/Percocet as needed.  #Streptococcus sepsis-highly suspicious for bowel source given malignancy involving the bowel.  Long-term antibiotic use; antibiotic coverage until the next 2 weeks.  Patient understand that she is at high risk for repeated infections/sepsis.  # Bone metastases-s/p X-geva-multiple electrolyte abnormalities poor tolerance hold.  #  Anemia-multifactorial hemoglobin -retacrictweekly 40,000 units; on HOLD for now.  Repeat labs at  next visit.  # follow up in 2 weeks/labs- then start afinitor; start aromasin today.     Orders Placed This Encounter  Procedures  . CBC with Differential    Standing Status:   Future     Standing Expiration Date:   10/29/2018  . Comprehensive metabolic panel    Standing Status:   Future    Standing Expiration Date:   10/29/2018       [name], MD 10/29/2017 7:52 AM

## 2017-10-28 NOTE — Progress Notes (Signed)
Patient here for follow-up s/p hospital f/u. Pt receiving IV antibiotics via port a cath.

## 2017-10-28 NOTE — Telephone Encounter (Signed)
Oral Oncology Patient Advocate Encounter  Received notification from [facility] that prior authorization for Afinitor is required.  PA submitted on CoverMyMeds [facility] Status is pending  Oral Oncology Clinic will continue to follow.  [place] Patient Advocate [phone] 10/28/2017 4:40 PM

## 2017-10-28 NOTE — Assessment & Plan Note (Addendum)
#  Metastatic breast cancer/lobular ER/PR positive HER-2/neu negative-most recently on Taxol-noted to have progression on the recent CT scan in February 2019 with increasing pelvic mass.  # Recommend Aromasin plus Afinitor the next line Of therapy.  Recommend starting Afinitor in approximately 2 weeks; after finishing antibiotics [see discussion below].  Patient understands treatments are palliative not curative.  Discussed with pharmacy.    #Discussed the potential side effects of Afinitor including but not limited to diarrhea low blood counts pneumonitis endocrine abnormalities skin rash and low blood counts.  Also discussed regarding mucositis/dexamethasone mouthwash.   #Abdominal pain-secondary to malignancy continue-MS Contin/Percocet as needed.  #Streptococcus sepsis-highly suspicious for bowel source given malignancy involving the bowel.  Long-term antibiotic use; antibiotic coverage until the next 2 weeks.  Patient understand that she is at high risk for repeated infections/sepsis.  # Bone metastases-s/p X-geva-multiple electrolyte abnormalities poor tolerance hold.  #  Anemia-multifactorial hemoglobin -retacrictweekly 40,000 units; on HOLD for now.  Repeat labs at next visit.  # follow up in 2 weeks/labs- then start afinitor; start aromasin today.

## 2017-10-29 MED ORDER — EVEROLIMUS 5 MG PO TABS: 5.0000 mg | ORAL_TABLET | Freq: Every day | ORAL | 1 refills | Status: AC

## 2017-10-29 MED ORDER — DEXAMETHASONE 0.5 MG/5ML PO SOLN: 1.0000 mg | Freq: Four times a day (QID) | ORAL | 4 refills | Status: AC

## 2017-10-29 MED ORDER — EXEMESTANE 25 MG PO TABS: 25.0000 mg | ORAL_TABLET | Freq: Every day | ORAL | 6 refills | Status: AC

## 2017-10-29 MED ORDER — EVEROLIMUS 5 MG PO TABS: 5.0000 mg | ORAL_TABLET | Freq: Every day | ORAL | 0 refills | Status: DC

## 2017-10-29 NOTE — Telephone Encounter (Signed)
Oral Oncology Pharmacist Encounter  Received new prescription for Everolimus and Exemestane for the treatment of metastatic breast cancer, planned duration until disease progression or unacceptable drug toxicity.  CBC/BMP from 10/15/16 assessed, no relevant lab abnormalities. Prescriptions dose and frequency assessed.   Current medication list in Epic reviewed, no DDIs with Everolimus or Exemestane identified.  Prescription has been e-scribed to the [facility] for benefits analysis and approval. Along with a dexamethasone mouth wash.   Patient education Counseled patient on administration, dosing, side effects, monitoring, drug-food interactions, safe handling, storage, and disposal. Patient will take exemestane 1 tablet (25 mg total) by mouth daily after breakfast. She will also take everolimus 1 tablet (5 mg total) by mouth daily. They plan is to hold starting the everolimus until after she has finished her course of IV abx.   She will use dexamethasone mouth wash along with the everolimus.   Side effects include but not limited to:  -Exemestane: hot flashes, arthralgia, fatigue -Everolimus: decreased Hgb/WBC/plt, changes in electrolytes, mouth sores, rash/itchy skin, diarrhea, HA, fluid retention, N/V  Reviewed with patient importance of keeping a medication schedule and plan for any missed doses.  Ms. Bawa voiced understanding and appreciation. All questions answered. Handout provided.  Oral Oncology Clinic will continue to follow for insurance authorization, copayment issues, and start date.  Provided patient with [facility] [place] [facility] phone number. Patient knows to call the office with questions or concerns. Oral Chemotherapy Navigation Clinic will continue to follow.  Darl Pikes, PharmD, BCPS Hematology/Oncology Clinical Pharmacist [facility] [place] [facility] [phone]  10/29/2017 8:15 AM

## 2017-10-29 NOTE — Telephone Encounter (Signed)
Oral Chemotherapy Pharmacist Encounter   Voucher for free 8-week supply of dexamethasone solution: BIN: [id number] PCN: CN GRP: [id number] ID: [id number]   Darl Pikes, PharmD, BCPS Hematology/Oncology Clinical Pharmacist [facility] [place] [facility] [phone]  10/29/2017 11:44 AM

## 2017-10-30 ENCOUNTER — Telehealth: Payer: Self-pay | Admitting: Internal Medicine

## 2017-10-30 MED FILL — DEXAMETHASONE 0.5 MG/5 ML L: 0.5 | 12 days supply | Qty: 500 | Fill #0

## 2017-10-30 MED FILL — EXEMESTANE 25 MG TABLET: 25 | 30 days supply | Qty: 30 | Fill #0

## 2017-10-30 MED FILL — AFINITOR 5 MG TAB: 5 | 28 days supply | Qty: 28 | Fill #0

## 2017-10-30 NOTE — Telephone Encounter (Addendum)
Oral Oncology Patient Advocate Encounter  Prior Authorization for Afinitor has been approved.    PA# [id number] Effective dates: 10/29/2017 through 05/01/2018  Co-pay $8.50   Oral Oncology Clinic will continue to follow.    [place] Patient Advocate [phone] 10/30/2017 8:07 AM

## 2017-10-30 NOTE — Telephone Encounter (Signed)
Oral Oncology Patient Advocate Encounter  Sent e-mail to [facility] to please mail out patients Afinitor, Aromasin, and Dexamethasone.   [place] Patient Advocate [phone] 10/30/2017 3:50 PM

## 2017-11-01 NOTE — Telephone Encounter (Signed)
Oral Oncology Patient Advocate Encounter  Patients medication was shipped 10/31/2017 from [facility].   [facility] Patient Advocate [phone] 11/01/2017 8:54 AM

## 2017-11-04 ENCOUNTER — Encounter: Payer: Self-pay | Admitting: Emergency Medicine

## 2017-11-04 ENCOUNTER — Other Ambulatory Visit: Payer: Self-pay

## 2017-11-04 ENCOUNTER — Telehealth: Payer: Self-pay | Admitting: *Deleted

## 2017-11-04 ENCOUNTER — Telehealth: Payer: Self-pay | Admitting: Internal Medicine

## 2017-11-04 ENCOUNTER — Emergency Department: Payer: Medicare HMO

## 2017-11-04 ENCOUNTER — Inpatient Hospital Stay
Admission: EM | Admit: 2017-11-04 | Discharge: 2017-11-07 | DRG: 843 | Disposition: A | Discharge status: Hospice | Payer: Medicare HMO | Attending: Internal Medicine | Admitting: Internal Medicine

## 2017-11-04 DIAGNOSIS — K5900 Constipation, unspecified: Secondary | ICD-10-CM | POA: Diagnosis not present

## 2017-11-04 DIAGNOSIS — E1140 Type 2 diabetes mellitus with diabetic neuropathy, unspecified: Secondary | ICD-10-CM | POA: Diagnosis present

## 2017-11-04 DIAGNOSIS — Z66 Do not resuscitate: Secondary | ICD-10-CM

## 2017-11-04 DIAGNOSIS — K311 Adult hypertrophic pyloric stenosis: Secondary | ICD-10-CM | POA: Diagnosis present

## 2017-11-04 DIAGNOSIS — C799 Secondary malignant neoplasm of unspecified site: Secondary | ICD-10-CM | POA: Diagnosis not present

## 2017-11-04 DIAGNOSIS — D638 Anemia in other chronic diseases classified elsewhere: Secondary | ICD-10-CM | POA: Diagnosis present

## 2017-11-04 DIAGNOSIS — J449 Chronic obstructive pulmonary disease, unspecified: Secondary | ICD-10-CM | POA: Diagnosis present

## 2017-11-04 DIAGNOSIS — C50812 Malignant neoplasm of overlapping sites of left female breast: Secondary | ICD-10-CM | POA: Diagnosis not present

## 2017-11-04 DIAGNOSIS — I10 Essential (primary) hypertension: Secondary | ICD-10-CM | POA: Diagnosis present

## 2017-11-04 DIAGNOSIS — R531 Weakness: Secondary | ICD-10-CM | POA: Diagnosis not present

## 2017-11-04 DIAGNOSIS — Z87891 Personal history of nicotine dependence: Secondary | ICD-10-CM

## 2017-11-04 DIAGNOSIS — B952 Enterococcus as the cause of diseases classified elsewhere: Secondary | ICD-10-CM | POA: Diagnosis present

## 2017-11-04 DIAGNOSIS — C7880 Secondary malignant neoplasm of unspecified digestive organ: Secondary | ICD-10-CM | POA: Diagnosis not present

## 2017-11-04 DIAGNOSIS — R143 Flatulence: Secondary | ICD-10-CM

## 2017-11-04 DIAGNOSIS — Z9013 Acquired absence of bilateral breasts and nipples: Secondary | ICD-10-CM | POA: Diagnosis not present

## 2017-11-04 DIAGNOSIS — C7989 Secondary malignant neoplasm of other specified sites: Secondary | ICD-10-CM | POA: Diagnosis not present

## 2017-11-04 DIAGNOSIS — Z7982 Long term (current) use of aspirin: Secondary | ICD-10-CM

## 2017-11-04 DIAGNOSIS — R112 Nausea with vomiting, unspecified: Secondary | ICD-10-CM | POA: Diagnosis not present

## 2017-11-04 DIAGNOSIS — Z79899 Other long term (current) drug therapy: Secondary | ICD-10-CM | POA: Diagnosis not present

## 2017-11-04 DIAGNOSIS — Z853 Personal history of malignant neoplasm of breast: Secondary | ICD-10-CM

## 2017-11-04 DIAGNOSIS — C784 Secondary malignant neoplasm of small intestine: Secondary | ICD-10-CM | POA: Diagnosis present

## 2017-11-04 DIAGNOSIS — G92 Toxic encephalopathy: Secondary | ICD-10-CM | POA: Diagnosis present

## 2017-11-04 DIAGNOSIS — R634 Abnormal weight loss: Secondary | ICD-10-CM

## 2017-11-04 DIAGNOSIS — G893 Neoplasm related pain (acute) (chronic): Secondary | ICD-10-CM | POA: Diagnosis present

## 2017-11-04 DIAGNOSIS — Z515 Encounter for palliative care: Secondary | ICD-10-CM | POA: Diagnosis not present

## 2017-11-04 DIAGNOSIS — E785 Hyperlipidemia, unspecified: Secondary | ICD-10-CM | POA: Diagnosis present

## 2017-11-04 DIAGNOSIS — K219 Gastro-esophageal reflux disease without esophagitis: Secondary | ICD-10-CM | POA: Diagnosis present

## 2017-11-04 DIAGNOSIS — R7881 Bacteremia: Secondary | ICD-10-CM | POA: Diagnosis present

## 2017-11-04 DIAGNOSIS — C786 Secondary malignant neoplasm of retroperitoneum and peritoneum: Secondary | ICD-10-CM

## 2017-11-04 DIAGNOSIS — R1084 Generalized abdominal pain: Secondary | ICD-10-CM

## 2017-11-04 LAB — CBC WITH DIFFERENTIAL/PLATELET
BASOS PCT: 1 %
Basophils Absolute: 0.1 10*3/uL (ref 0–0.1)
EOS PCT: 2 %
Eosinophils Absolute: 0.1 10*3/uL (ref 0–0.7)
HEMATOCRIT: 32.8 % — AB (ref 35.0–47.0)
Hemoglobin: 10.3 g/dL — ABNORMAL LOW (ref 12.0–16.0)
LYMPHS PCT: 16 %
Lymphs Abs: 1.3 10*3/uL (ref 1.0–3.6)
MCH: 26.5 pg (ref 26.0–34.0)
MCHC: 31.3 g/dL — AB (ref 32.0–36.0)
MCV: 84.9 fL (ref 80.0–100.0)
MONO ABS: 0.7 10*3/uL (ref 0.2–0.9)
MONOS PCT: 8 %
Neutro Abs: 6 10*3/uL (ref 1.4–6.5)
Neutrophils Relative %: 73 %
PLATELETS: 501 10*3/uL — AB (ref 150–440)
RBC: 3.87 MIL/uL (ref 3.80–5.20)
RDW: 16.6 % — AB (ref 11.5–14.5)
WBC: 8.2 10*3/uL (ref 3.6–11.0)

## 2017-11-04 LAB — COMPREHENSIVE METABOLIC PANEL
ALBUMIN: 2.9 g/dL — AB (ref 3.5–5.0)
ALT: 7 U/L — AB (ref 14–54)
AST: 24 U/L (ref 15–41)
Alkaline Phosphatase: 132 U/L — ABNORMAL HIGH (ref 38–126)
Anion gap: 15 (ref 5–15)
BILIRUBIN TOTAL: 1.9 mg/dL — AB (ref 0.3–1.2)
BUN: 12 mg/dL (ref 6–20)
CO2: 26 mmol/L (ref 22–32)
CREATININE: 0.57 mg/dL (ref 0.44–1.00)
Calcium: 9 mg/dL (ref 8.9–10.3)
Chloride: 97 mmol/L — ABNORMAL LOW (ref 101–111)
GFR calc Af Amer: >60 mL/min (ref >60)
GLUCOSE: 83 mg/dL (ref 65–99)
Potassium: 3.6 mmol/L (ref 3.5–5.1)
Sodium: 138 mmol/L (ref 135–145)
TOTAL PROTEIN: 6.9 g/dL (ref 6.5–8.1)

## 2017-11-04 LAB — LIPASE, BLOOD: LIPASE: 24 U/L (ref 11–51)

## 2017-11-04 LAB — LACTIC ACID, PLASMA: Lactic Acid, Venous: 0.7 mmol/L (ref 0.5–1.9)

## 2017-11-04 MED ORDER — EVEROLIMUS 5 MG PO TABS: 5.0000 mg | ORAL_TABLET | Freq: Every day | ORAL | Status: DC

## 2017-11-04 MED ORDER — VITAMIN B-1 100 MG PO TABS
100.0000 mg | ORAL_TABLET | Freq: Every day | ORAL | Status: DC
  Administered 2017-11-05 – 2017-11-06 (×2): 100 mg via ORAL
  Filled 2017-11-04 (×2): qty 1

## 2017-11-04 MED ORDER — TIOTROPIUM BROMIDE MONOHYDRATE 18 MCG IN CAPS
18.0000 ug | ORAL_CAPSULE | Freq: Every day | RESPIRATORY_TRACT | Status: DC
  Administered 2017-11-05 – 2017-11-07 (×3): 18 ug via RESPIRATORY_TRACT
  Filled 2017-11-04: qty 5

## 2017-11-04 MED ORDER — GABAPENTIN 300 MG PO CAPS
300.0000 mg | ORAL_CAPSULE | Freq: Two times a day (BID) | ORAL | Status: DC
  Administered 2017-11-04 – 2017-11-07 (×6): 300 mg via ORAL
  Filled 2017-11-04 (×6): qty 1

## 2017-11-04 MED ORDER — ORAL CARE MOUTH RINSE
15.0000 mL | Freq: Two times a day (BID) | OROMUCOSAL | Status: DC
  Administered 2017-11-05 – 2017-11-06 (×4): 15 mL via OROMUCOSAL

## 2017-11-04 MED ORDER — SODIUM CHLORIDE 0.9% FLUSH: 10.0000 mL | INTRAVENOUS | Status: DC | PRN

## 2017-11-04 MED ORDER — LIDOCAINE-PRILOCAINE 2.5-2.5 % EX CREA
1.0000 "application " | TOPICAL_CREAM | CUTANEOUS | Status: DC | PRN
  Filled 2017-11-04: qty 5

## 2017-11-04 MED ORDER — IOPAMIDOL (ISOVUE-300) INJECTION 61%: 75.0000 mL | Freq: Once | INTRAVENOUS | Status: DC | PRN

## 2017-11-04 MED ORDER — ENSURE ENLIVE PO LIQD: 237.0000 mL | Freq: Three times a day (TID) | ORAL | Status: DC

## 2017-11-04 MED ORDER — OXYCODONE-ACETAMINOPHEN 5-325 MG PO TABS: 1.0000 | ORAL_TABLET | Freq: Three times a day (TID) | ORAL | Status: DC | PRN

## 2017-11-04 MED ORDER — ENOXAPARIN SODIUM 40 MG/0.4ML ~~LOC~~ SOLN
40.0000 mg | SUBCUTANEOUS | Status: DC
  Administered 2017-11-04 – 2017-11-06 (×3): 40 mg via SUBCUTANEOUS
  Filled 2017-11-04 (×3): qty 0.4

## 2017-11-04 MED ORDER — MOMETASONE FURO-FORMOTEROL FUM 200-5 MCG/ACT IN AERO
2.0000 | INHALATION_SPRAY | Freq: Two times a day (BID) | RESPIRATORY_TRACT | Status: DC
  Administered 2017-11-04 – 2017-11-06 (×5): 2 via RESPIRATORY_TRACT
  Filled 2017-11-04: qty 8.8

## 2017-11-04 MED ORDER — SODIUM CHLORIDE 0.9 % IV SOLN
INTRAVENOUS | Status: DC
  Administered 2017-11-04 – 2017-11-06 (×3): via INTRAVENOUS

## 2017-11-04 MED ORDER — PROCHLORPERAZINE MALEATE 10 MG PO TABS
10.0000 mg | ORAL_TABLET | Freq: Four times a day (QID) | ORAL | Status: DC | PRN
  Filled 2017-11-04: qty 1

## 2017-11-04 MED ORDER — SODIUM CHLORIDE 0.9 % IV SOLN
2.0000 g | INTRAVENOUS | Status: DC
  Administered 2017-11-04: 2 g via INTRAVENOUS
  Filled 2017-11-04: qty 20

## 2017-11-04 MED ORDER — MORPHINE SULFATE (PF) 2 MG/ML IV SOLN
2.0000 mg | INTRAVENOUS | Status: DC | PRN
  Administered 2017-11-04: 2 mg via INTRAVENOUS
  Filled 2017-11-04: qty 1

## 2017-11-04 MED ORDER — MORPHINE SULFATE ER 30 MG PO TBCR
30.0000 mg | EXTENDED_RELEASE_TABLET | Freq: Two times a day (BID) | ORAL | Status: DC
  Administered 2017-11-04 – 2017-11-05 (×2): 30 mg via ORAL
  Filled 2017-11-04 (×2): qty 1

## 2017-11-04 MED ORDER — SENNOSIDES-DOCUSATE SODIUM 8.6-50 MG PO TABS: 1.0000 | ORAL_TABLET | Freq: Every evening | ORAL | Status: DC | PRN

## 2017-11-04 MED ORDER — ASPIRIN EC 81 MG PO TBEC
81.0000 mg | DELAYED_RELEASE_TABLET | Freq: Every day | ORAL | Status: DC
  Administered 2017-11-04 – 2017-11-07 (×4): 81 mg via ORAL
  Filled 2017-11-04 (×4): qty 1

## 2017-11-04 MED ORDER — DEXAMETHASONE 0.5 MG/5ML PO SOLN
1.0000 mg | Freq: Four times a day (QID) | ORAL | Status: DC
  Administered 2017-11-05 – 2017-11-07 (×9): 1 mg via ORAL
  Filled 2017-11-04 (×13): qty 10

## 2017-11-04 MED ORDER — EXEMESTANE 25 MG PO TABS
25.0000 mg | ORAL_TABLET | Freq: Every day | ORAL | Status: DC
  Administered 2017-11-05: 25 mg via ORAL
  Filled 2017-11-04 (×3): qty 1

## 2017-11-04 MED ORDER — LORATADINE 10 MG PO TABS
10.0000 mg | ORAL_TABLET | Freq: Every day | ORAL | Status: DC
  Administered 2017-11-05 – 2017-11-07 (×3): 10 mg via ORAL
  Filled 2017-11-04 (×3): qty 1

## 2017-11-04 MED ORDER — ALBUTEROL SULFATE (2.5 MG/3ML) 0.083% IN NEBU: 2.5000 mg | INHALATION_SOLUTION | Freq: Four times a day (QID) | RESPIRATORY_TRACT | Status: DC | PRN

## 2017-11-04 MED ORDER — SODIUM CHLORIDE 0.9% FLUSH
10.0000 mL | Freq: Two times a day (BID) | INTRAVENOUS | Status: DC
  Administered 2017-11-04 – 2017-11-06 (×4): 10 mL

## 2017-11-04 MED ORDER — PANTOPRAZOLE SODIUM 40 MG PO TBEC
40.0000 mg | DELAYED_RELEASE_TABLET | Freq: Every day | ORAL | Status: DC
  Administered 2017-11-04 – 2017-11-07 (×4): 40 mg via ORAL
  Filled 2017-11-04 (×4): qty 1

## 2017-11-04 MED ORDER — MONTELUKAST SODIUM 10 MG PO TABS
10.0000 mg | ORAL_TABLET | Freq: Every day | ORAL | Status: DC
  Administered 2017-11-04 – 2017-11-06 (×3): 10 mg via ORAL
  Filled 2017-11-04 (×3): qty 1

## 2017-11-04 MED ORDER — DOCUSATE SODIUM 100 MG PO CAPS
100.0000 mg | ORAL_CAPSULE | Freq: Two times a day (BID) | ORAL | Status: DC
  Administered 2017-11-04 – 2017-11-06 (×4): 100 mg via ORAL
  Filled 2017-11-04 (×4): qty 1

## 2017-11-04 MED ORDER — CEFTRIAXONE IV (FOR PTA / DISCHARGE USE ONLY): 2.0000 g | INTRAVENOUS | Status: DC

## 2017-11-04 MED ORDER — ATORVASTATIN CALCIUM 20 MG PO TABS
10.0000 mg | ORAL_TABLET | Freq: Every evening | ORAL | Status: DC
  Administered 2017-11-04 – 2017-11-05 (×2): 10 mg via ORAL
  Filled 2017-11-04 (×2): qty 1

## 2017-11-04 MED ORDER — HYDROMORPHONE HCL 1 MG/ML IJ SOLN
1.0000 mg | Freq: Once | INTRAMUSCULAR | Status: AC
  Administered 2017-11-04: 1 mg via INTRAVENOUS
  Filled 2017-11-04: qty 1

## 2017-11-04 MED ORDER — ONDANSETRON HCL 4 MG/2ML IJ SOLN: 4.0000 mg | Freq: Four times a day (QID) | INTRAMUSCULAR | Status: DC | PRN

## 2017-11-04 MED ORDER — VITAMIN B-12 1000 MCG PO TABS
500.0000 ug | ORAL_TABLET | Freq: Every day | ORAL | Status: DC
Start: 2017-11-04 — End: 2017-11-06
  Administered 2017-11-05 – 2017-11-06 (×2): 500 ug via ORAL
  Filled 2017-11-04 (×2): qty 1

## 2017-11-04 MED ORDER — METOCLOPRAMIDE HCL 5 MG PO TABS
10.0000 mg | ORAL_TABLET | Freq: Four times a day (QID) | ORAL | Status: DC
  Administered 2017-11-04 – 2017-11-07 (×10): 10 mg via ORAL
  Filled 2017-11-04 (×10): qty 2

## 2017-11-04 MED ORDER — FLUTICASONE PROPIONATE 50 MCG/ACT NA SUSP
2.0000 | Freq: Every day | NASAL | Status: DC
  Administered 2017-11-05 – 2017-11-07 (×3): 2 via NASAL
  Filled 2017-11-04 (×2): qty 16

## 2017-11-04 MED ORDER — ONDANSETRON HCL 4 MG/2ML IJ SOLN
4.0000 mg | Freq: Once | INTRAMUSCULAR | Status: AC
  Administered 2017-11-04: 4 mg via INTRAVENOUS
  Filled 2017-11-04: qty 2

## 2017-11-04 MED ORDER — PIPERACILLIN-TAZOBACTAM 3.375 G IVPB 30 MIN
3.3750 g | Freq: Once | INTRAVENOUS | Status: AC
  Administered 2017-11-04: 3.375 g via INTRAVENOUS
  Filled 2017-11-04: qty 50

## 2017-11-04 MED ORDER — SODIUM CHLORIDE 0.9 % IV BOLUS (SEPSIS)
1000.0000 mL | Freq: Once | INTRAVENOUS | Status: AC
  Administered 2017-11-04: 1000 mL via INTRAVENOUS

## 2017-11-04 MED ORDER — ONDANSETRON 8 MG PO TBDP
8.0000 mg | ORAL_TABLET | Freq: Three times a day (TID) | ORAL | Status: DC | PRN
  Filled 2017-11-04: qty 1

## 2017-11-04 NOTE — ED Notes (Signed)
Attempted to call report, RN unable to take at this time. 

## 2017-11-04 NOTE — ED Notes (Signed)
Spoke with pt's son Tommi Rumps and updated him on pt's status

## 2017-11-04 NOTE — ED Provider Notes (Signed)
[facility] Emergency Department Provider Note  ____________________________________________  Time seen: Approximately 5:19 PM  I have reviewed the triage vital signs and the nursing notes.   HISTORY  Chief Complaint Abdominal Pain and Emesis    HPI Sophia Nelson is a 72 y.o. female Comes the ED with generalized severe abdominal pain, nonradiating, no aggravating or alleviating factors, constant. Vomiting 2 days, worse this morning. No fever or chills.  Review of electronic medical record reveals the Sophia Nelson has metastatic breast cancer with a large intra-abdominal tumor. She is status post Roux-en-Y gastric bypass. She's previously had a small bowel obstruction, not a surgical candidate at this point with her advanced metastatic cancer that is not responsive to chemotherapy.     Past Medical History:  Diagnosis Date  . Abdominal distention   . AKI (acute kidney injury) ([place]) 02/24/2017  . Anemia   . Arthritis   . Asthma   . Back pain, chronic 03/15/2017  . Breast cancer ([place]) 2009   RT MASTECTOMY, DCIS  . 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 Cancer of left breast ([place]) 08/09/2015   T4 N1 M1 tumor, INVASIVE LOBULAR CARCINOMA.  . Carcinoma of overlapping sites of left breast in female, estrogen receptor positive ([place]) 03/22/2016  . COPD (chronic obstructive pulmonary disease) ([place])   . Diabetes ([place]) 02/24/2017  . Diabetes mellitus without complication ([place])   . Duodenal obstruction   . Encounter for nasogastric (NG) tube placement   . Gastric outlet obstruction 02/24/2017  . GERD (gastroesophageal reflux disease)   . Headache    h/o migraines as a child  . Hypertension   . Neck pain 04/19/2016  . Pancreatitis, acute 03/04/2017  . Pneumonia 2015  . Recurrent low back pain 03/15/2017  . Shortness of breath dyspnea    with exertion     Sophia Nelson Active  Problem List   Diagnosis Date Noted  . Protein-calorie malnutrition, severe 10/13/2017  . Sepsis ([place]) 10/11/2017  . Acute respiratory distress   . Symptomatic anemia   . Acute on chronic respiratory failure with hypoxia ([place]) 09/21/2017  . Cholecystitis 08/07/2017  . Syncope   . Metastatic breast cancer ([place])   . Hypotension   . Palliative care by specialist   . Goals of care, counseling/discussion   . Severe sepsis with septic shock ([place]) 06/09/2017  . Hypocalcemia 05/30/2017  . Metastasis to bone ([place]) 04/30/2017  . Acute back pain 03/28/2017  . Back pain, chronic 03/15/2017  . Chronic lumbar pain 03/15/2017  . Recurrent low back pain 03/15/2017  . Duodenal obstruction   . Pancreatitis, acute 03/04/2017  . Abdominal distention   . Encounter for nasogastric (NG) tube placement   . Gastric outlet obstruction 02/24/2017  . AKI (acute kidney injury) ([place]) 02/24/2017  . GERD (gastroesophageal reflux disease) 02/24/2017  . Diabetes ([place]) 02/24/2017  . COPD (chronic obstructive pulmonary disease) ([place]) 02/24/2017  . Neck pain 04/19/2016  . Carcinoma of overlapping sites of left breast in female, estrogen receptor positive ([place]) 03/22/2016     Past Surgical History:  Procedure Laterality Date  . ABDOMINAL HYSTERECTOMY    . BREAST SURGERY Right 2015   mastectomy  . ESOPHAGOGASTRODUODENOSCOPY N/A 02/25/2017   Procedure: ESOPHAGOGASTRODUODENOSCOPY (EGD);  Surgeon: Jonathon Bellows, MD;  [facility];  Service: Endoscopy;  Laterality: N/A;  . ESOPHAGOGASTRODUODENOSCOPY (EGD) WITH PROPOFOL N/A 03/06/2017   Procedure: ESOPHAGOGASTRODUODENOSCOPY (EGD) WITH PROPOFOL;  Surgeon: [name], MD;  [facility] END[facility];  Service: Endoscopy;  Laterality: N/A;  . GASTROJEJUNOSTOMY N/A 03/08/2017   Procedure: Roux-en-Y gastrojejunostomy Bypass of Gastric Outlet Obstruction, small bowel resection;  Surgeon: Vickie Epley, MD;  Location: [facility] ORS;  Service: General;  Laterality:  N/A;  . PORTACATH PLACEMENT Right 08/01/2017   Procedure: INSERTION PORT-A-CATH;  Surgeon: Christene Lye, MD;  Location: [facility] ORS;  Service: General;  Laterality: Right;  . SIMPLE MASTECTOMY WITH AXILLARY SENTINEL NODE BIOPSY Left 02/29/2016   Procedure: SIMPLE MASTECTOMY;  Surgeon: Christene Lye, MD;  Location: [facility] ORS;  Service: General;  Laterality: Left;  . TUBAL LIGATION       Prior to Admission medications   Medication Sig Start Date End Date Taking? Authorizing Provider  ADVAIR DISKUS 500-50 MCG/DOSE AEPB Inhale 1 puff into the lungs 2 (two) times daily.  05/04/15  Yes [provider]  aspirin EC 81 MG tablet Take 81 mg by mouth daily.   Yes [provider]  atorvastatin (LIPITOR) 10 MG tablet Take 10 mg by mouth every evening.    Yes [provider]  cefTRIAXone 2 g in sodium chloride 0.9 % 100 mL Inject 2 g into the vein daily. 10/17/17  Yes Salary, Holly Bodily D, MD  docusate sodium (COLACE) 100 MG capsule Take 100 mg by mouth 2 (two) times daily.   Yes [provider]  gabapentin (NEURONTIN) 300 MG capsule Take 300 mg by mouth 2 (two) times daily.   Yes [provider]  loratadine (CLARITIN) 10 MG tablet Take 10 mg by mouth daily.   Yes [provider]  montelukast (SINGULAIR) 10 MG tablet Take 10 mg by mouth at bedtime.   Yes [provider]  morphine (MS CONTIN) 30 MG 12 hr tablet Take 1 tablet (30 mg total) by mouth every 12 (twelve) hours. 10/04/17  Yes [name], MD  omeprazole (PRILOSEC) 20 MG capsule Take 20 mg by mouth every morning.  06/23/15  Yes [provider]  oxyCODONE-acetaminophen (PERCOCET/ROXICET) 5-325 MG tablet Take 1 tablet by mouth every 8 (eight) hours as needed for severe pain. 10/04/17  Yes [name], MD  potassium chloride SA (K-DUR,KLOR-CON) 20 MEQ tablet 1 pill twice a day Sophia Nelson taking differently: Take 20 mEq by mouth daily.  10/08/17  Yes [name], MD  thiamine 100 MG tablet Take 1 tablet (100 mg total) by mouth daily. 10/18/17  Yes Salary, Avel Peace, MD  tiotropium (SPIRIVA) 18 MCG inhalation capsule Place 1 capsule (18 mcg total) into inhaler and inhale daily. 10/18/17  Yes Salary, Avel Peace, MD  vitamin B-12 500 MCG tablet Take 1 tablet (500 mcg total) by mouth daily. 10/18/17  Yes Salary, Avel Peace, MD  albuterol (PROVENTIL HFA;VENTOLIN HFA) 108 (90 Base) MCG/ACT inhaler Inhale 2 puffs into the lungs every 6 (six) hours as needed for wheezing or shortness of breath.    [provider]  cefTRIAXone (ROCEPHIN) IVPB Inject 2 g into the vein daily. Indication:  Streptococcus gallolyticus bacteremia and presumed portacath infection Last Day of Therapy:  11/11/17 Labs - Once weekly:  CBC/D and BMP, Labs - Every other week:  ESR and CRP 10/17/17   Salary, Montell D, MD  dexamethasone (DECADRON) 0.5 MG/5ML solution Take 10 mLs (1 mg total) by mouth 4 (four) times daily. Swish solution for 2 minutes then spit. Sophia Nelson not taking: Reported on 11/04/2017 10/29/17   [name], MD  everolimus (AFINITOR) 5 MG  tablet Take 1 tablet (5 mg total) by mouth daily. 10/29/17   [name], MD  exemestane (AROMASIN) 25 MG tablet Take 1 tablet (25 mg total) by mouth daily after breakfast. 10/29/17   [name], MD  feeding supplement, ENSURE ENLIVE, (ENSURE ENLIVE) LIQD Take 237 mLs by mouth 3 (three) times daily between meals. 03/13/17   Fritzi Mandes, MD  fluticasone (FLONASE) 50 MCG/ACT nasal spray Place 2 sprays into both nostrils daily.    [provider]  lidocaine-prilocaine (EMLA) cream Apply 1 application topically as needed (for port access).  04/26/17   [provider]  loperamide (IMODIUM) 2 MG capsule One pill after each loose stool; maximum upto 8 pills a day. Sophia Nelson not taking: Reported on 10/28/2017 05/02/17   [name], MD  Magnesium Cl-Calcium Carbonate (SLOW MAGNESIUM/CALCIUM)  70-117 MG TBEC Take 1 tablet by mouth 2 (two) times daily. 06/28/17   [name], MD  metoCLOPramide (REGLAN) 10 MG tablet Take 1 tablet (10 mg total) by mouth 4 (four) times daily. 10/08/17   [name], MD  ondansetron (ZOFRAN ODT) 8 MG disintegrating tablet Take 1 tablet (8 mg total) by mouth every 8 (eight) hours as needed for nausea or vomiting. Sophia Nelson not taking: Reported on 10/28/2017 07/26/17   [name], MD  prochlorperazine (COMPAZINE) 10 MG tablet Take 1 tablet (10 mg total) by mouth every 6 (six) hours as needed for nausea or vomiting. Sophia Nelson not taking: Reported on 10/28/2017 07/26/17   [name], MD  senna-docusate (SENOKOT-S) 8.6-50 MG tablet Take 1 tablet by mouth at bedtime as needed for mild constipation. Sophia Nelson not taking: Reported on 11/04/2017 06/17/17   Nicholes Mango, MD     Allergies Other   Family History  Problem Relation Age of Onset  . Lung cancer Father   . Cancer Maternal Aunt   . Dementia Mother   . Breast cancer Neg Hx     Social History Social History   Tobacco Use  . Smoking status: Former Smoker    Packs/day: 0.25    Years: 15.00    Pack years: 3.75    Types: Cigarettes    Last attempt to quit: 07/23/2017    Years since quitting: 0.2  . Smokeless tobacco: Never Used  . Tobacco comment: currently as of 02-21-16 pt states she is only smoking 2-3 cigarettes per day  Substance Use Topics  . 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 Drug use: No    Review of Systems  Constitutional:   No fever or chills.  Cardiovascular:   No chest pain or syncope. Respiratory:   No dyspnea or cough. Gastrointestinal:   positive generalized abdominal pain with vomiting. Frequent bowel movements today and yesterday. Musculoskeletal:   Negative for focal pain or swelling All other systems reviewed and  are negative except as documented above in ROS and HPI.  ____________________________________________   PHYSICAL EXAM:  VITAL SIGNS: ED Triage Vitals  Enc Vitals Group     BP 11/04/17 1109 119/71     Pulse Rate 11/04/17 1109 99     Resp 11/04/17 1109 16     Temp 11/04/17 1109 99.1 F (37.3 C)     Temp Source 11/04/17 1109 Oral     SpO2 11/04/17 1109 100 %     Weight 11/04/17  1107 112 lb (50.8 kg)     Height 11/04/17 1107 _0  (1.575 m)     Head Circumference --      Peak Flow --      Pain Score 11/04/17 1107 7     Pain Loc --      Pain Edu? --      Excl. in [place]? --     Vital signs reviewed, nursing assessments reviewed.   Constitutional:   Alert and oriented.ill-appearing. Eyes:   No scleral icterus.  EOMI. No nystagmus. No conjunctival pallor. PERRL. ENT   Head:   Normocephalic and atraumatic.   Nose:   No congestion/rhinnorhea.    Mouth/Throat:   dry mucous membranes, no pharyngeal erythema. No peritonsillar mass.    Neck:   No meningismus. Full ROM. Hematological/Lymphatic/Immunilogical:   No cervical lymphadenopathy. Cardiovascular:   RRR. Symmetric bilateral radial and DP pulses.  No murmurs.  Respiratory:   Normal respiratory effort without tachypnea/retractions. Breath sounds are clear and equal bilaterally. No wheezes/rales/rhonchi. Gastrointestinal:   distended, generalized tenderness, pain with percussion. There is no CVA tenderness.  no rigidity or guarding. Genitourinary:   deferred Musculoskeletal:   Normal range of motion in all extremities. No joint effusions.  No lower extremity tenderness.  No edema. Neurologic:   Normal speech and language.  Motor grossly intact. No acute focal neurologic deficits are appreciated.  Skin:    Skin is warm, dry and intact. No rash noted.  No petechiae, purpura, or bullae.  ____________________________________________    LABS (pertinent positives/negatives) (all labs ordered are listed, but only abnormal  results are displayed) Labs Reviewed  COMPREHENSIVE METABOLIC PANEL - Abnormal; Notable for the following components:      Result Value   Chloride 97 (*)    Albumin 2.9 (*)    ALT 7 (*)    Alkaline Phosphatase 132 (*)    Total Bilirubin 1.9 (*)    All other components within normal limits  CBC WITH DIFFERENTIAL/PLATELET - Abnormal; Notable for the following components:   Hemoglobin 10.3 (*)    HCT 32.8 (*)    MCHC 31.3 (*)    RDW 16.6 (*)    Platelets 501 (*)    All other components within normal limits  CULTURE, BLOOD (ROUTINE X 2)  CULTURE, BLOOD (ROUTINE X 2)  URINE CULTURE  LIPASE, BLOOD  LACTIC ACID, PLASMA  URINALYSIS, COMPLETE (UACMP) WITH MICROSCOPIC   ____________________________________________   EKG    ____________________________________________    RADIOLOGY  Ct Abdomen Pelvis W Contrast  Result Date: 11/04/2017 CLINICAL DATA:  72 year old female with metastatic breast cancer with abdominal and small bowel involvement. Post Roux-en-Y for bowel obstruction. Now presenting with fever and form stomach. Abdominal pain with vomiting. Prior hysterectomy. Subsequent encounter. EXAM: CT ABDOMEN AND PELVIS WITH CONTRAST TECHNIQUE: Multidetector CT imaging of the abdomen and pelvis was performed using the standard protocol following bolus administration of intravenous contrast. CONTRAST:  75 cc Isovue 300. COMPARISON:  10/11/2017 CT. FINDINGS: Lower chest: No worrisome abnormality. Central line tip within the right atrium. Hepatobiliary: Intra and extrahepatic biliary duct dilation. No calcified common bile duct stone or obvious pancreatic mass seen as cause of obstruction. Correlation with liver enzymes recommended. If further delineation were clinically desired MRCP or ERCP could be performed. Slightly heterogeneous liver without worrisome mass. Pancreas: No focal pancreatic mass. Spleen: No splenic mass or enlargement. Adrenals/Urinary Tract: Persistent mild right-sided  hydronephrosis may be caused by metastatic involvement of the right posterolateral pelvis narrowing the  right ureter. Minimal fullness left renal collecting system. No mild prominence left adrenal gland without significant nodularity. Right adrenal gland unremarkable. Partially decompressed urinary bladder with multiple nodules suggests of metastatic involvement of the bladder wall. Stomach/Bowel: Post Roux-en-Y. 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 Metastatic involvement of the small bowel near the Roux-en-Y with proximal obstruction. Metastatic involvement of the ileum. Stomach, small bowel and colonic wall thickening and ascites without free intraperitoneal air. This may reflect underlying enterocolitis given Sophia Nelson's fever. Vascular/Lymphatic: Prominent atherosclerotic changes aorta and aortic branch vessels. No aortic aneurysm or large vessel occlusion. Prominent narrowing celiac artery origin and common iliac artery origin bilaterally. Scattered small to slightly enlarged lymph nodes most notable left periaortic region. Reproductive: Post hysterectomy. Other: 4 cm ill-defined area posterior right pelvis is previously noted suspicious for metastatic involvement. Third spacing of fluid. Peritoneal implants suspected. No free intraperitoneal air or bowel containing hernia. Musculoskeletal: Degenerative changes lumbar spine with Schmorl's node deformity superior endplate L3. No osseous destructive lesion. IMPRESSION: Post Roux-en-Y. 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 Metastatic involvement of the small bowel near the Roux-en-Y with proximal obstruction. Metastatic involvement of the ileum Stomach, small bowel and colonic wall thickening and ascites may reflect underlying enterocolitis given Sophia Nelson's fever. Persistent mild right-sided hydronephrosis may be  caused by metastatic disease of the right posterolateral pelvis (spanning over 4 cm) and subsequent narrowing the right ureter. Partially decompressed urinary bladder with multiple nodules suggests of metastatic involvement of the bladder wall. Third spacing of fluid with peritoneal implants suspected. Intra and extrahepatic biliary duct dilation. No calcified common bile duct stone or obvious pancreatic mass seen as cause of obstruction. Correlation with liver enzymes recommended. If further delineation were clinically desired MRCP or ERCP could be performed. Central line tip within the right atrium. Aortic Atherosclerosis (ICD10-I70.0). Electronically Signed   By: Genia Del M.D.   On: 11/04/2017 14:25    ____________________________________________   PROCEDURES Procedures  ____________________________________________    CLINICAL IMPRESSION / ASSESSMENT AND PLAN / ED COURSE  Pertinent labs & imaging results that were available during my care of the Sophia Nelson were reviewed by me and considered in my medical decision making (see chart for details).   Sophia Nelson presents with generalized abdominal pain, concerning for bowel perforation or obstruction with concern for peritonitis. I will need to obtain a CT scan of the abdomen pelvis to further evaluate. Low suspicion of AAA or mesenteric ischemia or dissection. Sophia Nelson's not septic at this time and has no clear infectious source, but with her severe abdominal pain and comorbidities including metastatic cancer on chemotherapy, I'll give a dose of Zosyn while pursuing a workup. Check labs, IV fluids for hydration. Dilaudid and Zofran for symptom control.  Clinical Course as of Nov 04 1717  Mon Nov 04, 2017  1532 Ct reviewed - proximal obstruction from recurrent tumor. D/w surgery Rosana Hoes, non operative management. He reports this is a chronic issue with unfortunately little treatment options. Will d/w oncology as well.  [PS]    Clinical Course User  Index [PS] Carrie Mew, MD     ----------------------------------------- 5:22 PM on 11/04/2017 -----------------------------------------  Sophia Nelson's oncologist Dr. Rogue Bussing evaluated the Sophia Nelson in the ED, I discussed the care with him after his evaluation. He feels the Sophia Nelson is appropriate for palliative or hospice care at this time as we've reached the end of treatment options. Sophia Nelson has not  yet ready to make this decision. I will talk to the hospitalist for further care per hydration and symptom control and palliative care consult.  ____________________________________________   FINAL CLINICAL IMPRESSION(S) / ED DIAGNOSES    Final diagnoses:  Intractable vomiting with nausea, unspecified vomiting type  Generalized abdominal pain  Gastric outlet obstruction     ED Discharge Orders    None      Portions of this note were generated with dragon dictation software. Dictation errors may occur despite best attempts at proofreading.    Carrie Mew, MD 11/04/17 1723

## 2017-11-04 NOTE — Telephone Encounter (Signed)
I made Dr. Rogue Bussing aware of this phone call.

## 2017-11-04 NOTE — ED Notes (Signed)
Son Tommi Rumps called and notified of pt's status to be admitted.

## 2017-11-04 NOTE — Progress Notes (Signed)
Advanced care plan.  Purpose of the Encounter: CODE STATUS  Parties in Attendance: Patient herself Patient's Decision Capacity: Intact  Subjective/Patient's story: Patient is a 72 year old white female with known history of metastatic breast cancer who was noted to have metastases to the GI tract small bowel mesenteric deposits who is presenting to the ED with nausea vomiting noted to have progression of her disease.   Objective/Medical story I discussed with the patient regarding overall poor prognosis and recommended a DNR status.  Explained her CODE STATUS resuscitation efforts and CPR, ports that she wants to discuss further with her son who is coming in the morning   Goals of care determination:  Remains full code   CODE STATUS:  Full code  Time spent discussing advanced care planning: 16 minutes

## 2017-11-04 NOTE — Telephone Encounter (Signed)
Home Health nurse Amy called to report hat she has sent patient to ER for evaluation of possible SBO. She is having n/v and not really passing much stool. She reportedly has problems with obstructions

## 2017-11-04 NOTE — ED Notes (Signed)
Pt given ice chips. Per MD ok

## 2017-11-04 NOTE — Telephone Encounter (Signed)
Spoke to pt and also sonKelby Fam [over the phone] at length re: progressive disease; poor prognosis; recommendations for palliative care evaluation in AM/likely hospice at disposition. Also discussed re: CODE status; and futility of full code; no decisions yet.

## 2017-11-04 NOTE — ED Triage Notes (Addendum)
Seen by home health nurse today.  Patient was running a fever and had a "firm stomach, like I have a blockage".  Patient c/o abdominal pain x 1 day.  Symptoms include vomiting.  Patient was constipated and started taking MOM and prune juice yesterday.  Constipation resolved, but patient states abdominal pain started with taking the MOM.

## 2017-11-04 NOTE — ED Notes (Signed)
Son called and notified of bed assignment to 1C-123

## 2017-11-04 NOTE — H&P (Signed)
Sophia Nelson    MR#:  [id number]  DATE OF BIRTH:  10-11-1945  DATE OF ADMISSION:  11/04/2017  PRIMARY CARE PHYSICIAN: Donnie Coffin, MD   REQUESTING/REFERRING PHYSICIAN: Carrie Mew, MD  CHIEF COMPLAINT:   Chief Complaint  Patient presents with  . Abdominal Pain  . Emesis    HISTORY OF PRESENT ILLNESS: Sophia Nelson  is a 72 y.o. female with a known history of metastatic breast cancer to her GI tract who is presenting with nausea vomiting.  Patient has had multiple admissions recent for sepsis and is on IV antibiotics at home.  Patient states that she started having nausea vomiting and worsening abdominal pain for the past 2 days.  Symptoms have progressively gotten worse.  Patient was brought to the emergency room with the symptoms and had a CT scan and was seen by oncology who recommended that she has not responded to chemo and recommended admission for palliative care evaluation and possible hospice at home or hospice facility transfer. PAST MEDICAL HISTORY:   Past Medical History:  Diagnosis Date  . Abdominal distention   . AKI (acute kidney injury) ([place]) 02/24/2017  . Anemia   . Arthritis   . Asthma   . Back pain, chronic 03/15/2017  . Breast cancer ([place]) 2009   RT MASTECTOMY, DCIS  . 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 Cancer of left breast ([place]) 08/09/2015   T4 N1 M1 tumor, INVASIVE LOBULAR CARCINOMA.  . Carcinoma of overlapping sites of left breast in female, estrogen receptor positive ([place]) 03/22/2016  . COPD (chronic obstructive pulmonary disease) ([place])   . Diabetes ([place]) 02/24/2017  . Diabetes mellitus without complication ([place])   . Duodenal obstruction   . Encounter for nasogastric (NG) tube placement   . Gastric outlet obstruction 02/24/2017  . GERD (gastroesophageal reflux disease)   . Headache     h/o migraines as a child  . Hypertension   . Neck pain 04/19/2016  . Pancreatitis, acute 03/04/2017  . Pneumonia 2015  . Recurrent low back pain 03/15/2017  . Shortness of breath dyspnea    with exertion    PAST SURGICAL HISTORY:  Past Surgical History:  Procedure Laterality Date  . ABDOMINAL HYSTERECTOMY    . BREAST SURGERY Right 2015   mastectomy  . ESOPHAGOGASTRODUODENOSCOPY N/A 02/25/2017   Procedure: ESOPHAGOGASTRODUODENOSCOPY (EGD);  Surgeon: Jonathon Bellows, MD;  Location: [facility] ENDOSCOPY;  Service: Endoscopy;  Laterality: N/A;  . ESOPHAGOGASTRODUODENOSCOPY (EGD) WITH PROPOFOL N/A 03/06/2017   Procedure: ESOPHAGOGASTRODUODENOSCOPY (EGD) WITH PROPOFOL;  Surgeon: [name], MD;  Location: [facility] ENDOSCOPY;  Service: Endoscopy;  Laterality: N/A;  . GASTROJEJUNOSTOMY N/A 03/08/2017   Procedure: Roux-en-Y gastrojejunostomy Bypass of Gastric Outlet Obstruction, small bowel resection;  Surgeon: Vickie Epley, MD;  Location: [facility] ORS;  Service: General;  Laterality: N/A;  . PORTACATH PLACEMENT Right 08/01/2017   Procedure: INSERTION PORT-A-CATH;  Surgeon: Christene Lye, MD;  Location: [facility] ORS;  Service: General;  Laterality: Right;  . SIMPLE MASTECTOMY WITH AXILLARY SENTINEL NODE BIOPSY Left 02/29/2016   Procedure: SIMPLE MASTECTOMY;  Surgeon: Christene Lye, MD;  Location: [facility] ORS;  Service: General;  Laterality: Left;  . TUBAL LIGATION      SOCIAL HISTORY:  Social History   Tobacco Use  . Smoking status: Former Smoker    Packs/day: 0.25    Years: 15.00  Pack years: 3.75    Types: Cigarettes    Last attempt to quit: 07/23/2017    Years since quitting: 0.2  . Smokeless tobacco: Never Used  . Tobacco comment: currently as of 02-21-16 pt states she is only smoking 2-3 cigarettes per day  Substance Use Topics  . 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 Lung cancer Father   . Cancer Maternal Aunt   . Dementia Mother   . Breast cancer Neg Hx     DRUG ALLERGIES:  Allergies  Allergen Reactions  . Other Other (See Comments)    Onions(that grow in the yard-pt can eat onions without problems) and dust mite Sneezing, cough, runny nose    REVIEW OF SYSTEMS:   CONSTITUTIONAL: No fever, fatigue or weakness.  EYES: No blurred or double vision.  EARS, NOSE, AND THROAT: No tinnitus or ear pain.  RESPIRATORY: No cough, shortness of breath, wheezing or hemoptysis.  CARDIOVASCULAR: No chest pain, orthopnea, edema.  GASTROINTESTINAL: Positive nausea, positive vomiting, diarrhea or positive abdominal pain.  GENITOURINARY: No dysuria, hematuria.  ENDOCRINE: No polyuria, nocturia,  HEMATOLOGY: No anemia, easy bruising or bleeding SKIN: No rash or lesion. MUSCULOSKELETAL: No joint pain or arthritis.   NEUROLOGIC: No tingling, numbness, weakness.  PSYCHIATRY: No anxiety or depression.   MEDICATIONS AT HOME:  Prior to Admission medications   Medication Sig Start Date End Date Taking? Authorizing Provider  ADVAIR DISKUS 500-50 MCG/DOSE AEPB Inhale 1 puff into the lungs 2 (two) times daily.  05/04/15  Yes [provider]  aspirin EC 81 MG tablet Take 81 mg by mouth daily.   Yes [provider]  atorvastatin (LIPITOR) 10 MG tablet Take 10 mg by mouth every evening.    Yes [provider]  cefTRIAXone (ROCEPHIN) IVPB Inject 2 g into the vein daily. Indication:  Streptococcus gallolyticus bacteremia and presumed portacath infection Last Day of Therapy:  11/11/17 Labs - Once weekly:  CBC/D and BMP, Labs - Every other week:  ESR and CRP 10/17/17  Yes Salary, Montell D, MD  docusate sodium (COLACE) 100 MG capsule Take 100 mg by mouth 2 (two) times daily.   Yes [provider]  feeding supplement, ENSURE ENLIVE, (ENSURE ENLIVE) LIQD Take 237 mLs by  mouth 3 (three) times daily between meals. 03/13/17  Yes Fritzi Mandes, MD  gabapentin (NEURONTIN) 300 MG capsule Take 300 mg by mouth 2 (two) times daily.   Yes [provider]  loratadine (CLARITIN) 10 MG tablet Take 10 mg by mouth daily.   Yes [provider]  Magnesium Cl-Calcium Carbonate (SLOW MAGNESIUM/CALCIUM) 70-117 MG TBEC Take 1 tablet by mouth 2 (two) times daily. 06/28/17  Yes [name], MD  metoCLOPramide (REGLAN) 10 MG tablet Take 1 tablet (10 mg total) by mouth 4 (four) times daily. 10/08/17  Yes [name], MD  montelukast (SINGULAIR) 10 MG tablet Take 10 mg by mouth at bedtime.   Yes [provider]  morphine (MS CONTIN) 30 MG 12 hr tablet Take 1 tablet (30 mg total) by mouth every 12 (twelve) hours. 10/04/17  Yes [name], MD  omeprazole (PRILOSEC) 20 MG capsule Take 20 mg by mouth every morning.  06/23/15  Yes [provider]  oxyCODONE-acetaminophen (PERCOCET/ROXICET)  5-325 MG tablet Take 1 tablet by mouth every 8 (eight) hours as needed for severe pain. 10/04/17  Yes [name], MD  potassium chloride SA (K-DUR,KLOR-CON) 20 MEQ tablet 1 pill twice a day Patient taking differently: Take 20 mEq by mouth daily.  10/08/17  Yes [name], MD  thiamine 100 MG tablet Take 1 tablet (100 mg total) by mouth daily. 10/18/17  Yes Salary, Avel Peace, MD  tiotropium (SPIRIVA) 18 MCG inhalation capsule Place 1 capsule (18 mcg total) into inhaler and inhale daily. 10/18/17  Yes Salary, Avel Peace, MD  vitamin B-12 500 MCG tablet Take 1 tablet (500 mcg total) by mouth daily. 10/18/17  Yes Salary, Avel Peace, MD  albuterol (PROVENTIL HFA;VENTOLIN HFA) 108 (90 Base) MCG/ACT inhaler Inhale 2 puffs into the lungs every 6 (six) hours as needed for wheezing or shortness of breath.    [provider]  cefTRIAXone 2 g in sodium chloride 0.9 % 100 mL Inject 2 g into the vein daily. Patient not taking: Reported on  11/04/2017 10/17/17   Salary, Holly Bodily D, MD  dexamethasone (DECADRON) 0.5 MG/5ML solution Take 10 mLs (1 mg total) by mouth 4 (four) times daily. Swish solution for 2 minutes then spit. 10/29/17   [name], MD  everolimus (AFINITOR) 5 MG tablet Take 1 tablet (5 mg total) by mouth daily. 10/29/17   [name], MD  exemestane (AROMASIN) 25 MG tablet Take 1 tablet (25 mg total) by mouth daily after breakfast. 10/29/17   [name], MD  fluticasone (FLONASE) 50 MCG/ACT nasal spray Place 2 sprays into both nostrils daily.    [provider]  lidocaine-prilocaine (EMLA) cream Apply 1 application topically as needed (for port access).  04/26/17   [provider]  loperamide (IMODIUM) 2 MG capsule One pill after each loose stool; maximum upto 8 pills a day. Patient not taking: Reported on 10/28/2017 05/02/17   [name], MD  ondansetron (ZOFRAN ODT) 8 MG disintegrating tablet Take 1 tablet (8 mg total) by mouth every 8 (eight) hours as needed for nausea or vomiting. Patient not taking: Reported on 10/28/2017 07/26/17   [name], MD  prochlorperazine (COMPAZINE) 10 MG tablet Take 1 tablet (10 mg total) by mouth every 6 (six) hours as needed for nausea or vomiting. Patient not taking: Reported on 10/28/2017 07/26/17   [name], MD  senna-docusate (SENOKOT-S) 8.6-50 MG tablet Take 1 tablet by mouth at bedtime as needed for mild constipation. Patient not taking: Reported on 11/04/2017 06/17/17   Nicholes Mango, MD      PHYSICAL EXAMINATION:   VITAL SIGNS: Blood pressure (!) 141/85, pulse 93, temperature 99.1 F (37.3 C), temperature source Oral, resp. rate 18, height 5' 2"  (1.575 m), weight 112 lb (50.8 kg), SpO2 100 %.  GENERAL:  72 y.o.-year-old patient lying in the bed with no acute distress.  EYES: Pupils equal, round, reactive to light and accommodation. No scleral icterus. Extraocular muscles intact.  HEENT: Head atraumatic,  normocephalic. Oropharynx and nasopharynx clear.  NECK:  Supple, no jugular venous distention. No thyroid enlargement, no tenderness.  LUNGS: Normal breath sounds bilaterally, no wheezing, rales,rhonchi or crepitation. No use of accessory muscles of respiration.  CARDIOVASCULAR: S1, S2 normal. No murmurs, rubs, or gallops.  ABDOMEN: Gastric tenderness distended.  Diminished bowel sounds  No organomegaly or mass.  EXTREMITIES: No pedal edema, cyanosis, or clubbing.  NEUROLOGIC: Cranial nerves II through XII are intact. Muscle strength 5/5 in  all extremities. Sensation intact. Gait not checked.  PSYCHIATRIC: The patient is alert and oriented x 3.  SKIN: No obvious rash, lesion, or ulcer.   LABORATORY PANEL:   CBC Recent Labs  Lab 11/04/17 1300  WBC 8.2  HGB 10.3*  HCT 32.8*  PLT 501*  MCV 84.9  MCH 26.5  MCHC 31.3*  RDW 16.6*  LYMPHSABS 1.3  MONOABS 0.7  EOSABS 0.1  BASOSABS 0.1   ------------------------------------------------------------------------------------------------------------------  Chemistries  Recent Labs  Lab 11/04/17 1300  NA 138  K 3.6  CL 97*  CO2 26  GLUCOSE 83  BUN 12  CREATININE 0.57  CALCIUM 9.0  AST 24  ALT 7*  ALKPHOS 132*  BILITOT 1.9*   ------------------------------------------------------------------------------------------------------------------ estimated creatinine clearance is 52.5 mL/min (by C-G formula based on SCr of 0.57 mg/dL). ------------------------------------------------------------------------------------------------------------------ No results for input(s): TSH, T4TOTAL, T3FREE, THYROIDAB in the last 72 hours.  Invalid input(s): FREET3   Coagulation profile No results for input(s): INR, PROTIME in the last 168 hours. ------------------------------------------------------------------------------------------------------------------- No results for input(s): DDIMER in the last 72  hours. -------------------------------------------------------------------------------------------------------------------  Cardiac Enzymes No results for input(s): CKMB, TROPONINI, MYOGLOBIN in the last 168 hours.  Invalid input(s): CK ------------------------------------------------------------------------------------------------------------------ Invalid input(s): POCBNP  ---------------------------------------------------------------------------------------------------------------  Urinalysis    Component Value Date/Time   COLORURINE YELLOW (A) 10/11/2017 1522   APPEARANCEUR CLEAR (A) 10/11/2017 1522   LABSPEC 1.011 10/11/2017 1522   PHURINE 5.0 10/11/2017 1522   GLUCOSEU NEGATIVE 10/11/2017 1522   HGBUR NEGATIVE 10/11/2017 1522   BILIRUBINUR NEGATIVE 10/11/2017 1522   KETONESUR NEGATIVE 10/11/2017 1522   PROTEINUR NEGATIVE 10/11/2017 1522   NITRITE NEGATIVE 10/11/2017 1522   LEUKOCYTESUR NEGATIVE 10/11/2017 1522     RADIOLOGY: Ct Abdomen Pelvis W Contrast  Result Date: 11/04/2017 CLINICAL DATA:  72 year old female with metastatic breast cancer with abdominal and small bowel involvement. Post Roux-en-Y for bowel obstruction. Now presenting with fever and form stomach. Abdominal pain with vomiting. Prior hysterectomy. Subsequent encounter. EXAM: CT ABDOMEN AND PELVIS WITH CONTRAST TECHNIQUE: Multidetector CT imaging of the abdomen and pelvis was performed using the standard protocol following bolus administration of intravenous contrast. CONTRAST:  75 cc Isovue 300. COMPARISON:  10/11/2017 CT. FINDINGS: Lower chest: No worrisome abnormality. Central line tip within the right atrium. Hepatobiliary: Intra and extrahepatic biliary duct dilation. No calcified common bile duct stone or obvious pancreatic mass seen as cause of obstruction. Correlation with liver enzymes recommended. If further delineation were clinically desired MRCP or ERCP could be performed. Slightly heterogeneous  liver without worrisome mass. Pancreas: No focal pancreatic mass. Spleen: No splenic mass or enlargement. Adrenals/Urinary Tract: Persistent mild right-sided hydronephrosis may be caused by metastatic involvement of the right posterolateral pelvis narrowing the right ureter. Minimal fullness left renal collecting system. No mild prominence left adrenal gland without significant nodularity. Right adrenal gland unremarkable. Partially decompressed urinary bladder with multiple nodules suggests of metastatic involvement of the bladder wall. Stomach/Bowel: Post Roux-en-Y. 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 Metastatic involvement of the small bowel near the Roux-en-Y with proximal obstruction. Metastatic involvement of the ileum. Stomach, small bowel and colonic wall thickening and ascites without free intraperitoneal air. This may reflect underlying enterocolitis given patient's fever. Vascular/Lymphatic: Prominent atherosclerotic changes aorta and aortic branch vessels. No aortic aneurysm or large vessel occlusion. Prominent narrowing celiac artery origin and common iliac artery origin bilaterally. Scattered small to slightly enlarged lymph nodes most notable left periaortic region. Reproductive: Post hysterectomy. Other: 4  cm ill-defined area posterior right pelvis is previously noted suspicious for metastatic involvement. Third spacing of fluid. Peritoneal implants suspected. No free intraperitoneal air or bowel containing hernia. Musculoskeletal: Degenerative changes lumbar spine with Schmorl's node deformity superior endplate L3. No osseous destructive lesion. IMPRESSION: Post Roux-en-Y. 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 Metastatic involvement of the small bowel near the Roux-en-Y with proximal obstruction. Metastatic involvement of the ileum Stomach, small  bowel and colonic wall thickening and ascites may reflect underlying enterocolitis given patient's fever. Persistent mild right-sided hydronephrosis may be caused by metastatic disease of the right posterolateral pelvis (spanning over 4 cm) and subsequent narrowing the right ureter. Partially decompressed urinary bladder with multiple nodules suggests of metastatic involvement of the bladder wall. Third spacing of fluid with peritoneal implants suspected. Intra and extrahepatic biliary duct dilation. No calcified common bile duct stone or obvious pancreatic mass seen as cause of obstruction. Correlation with liver enzymes recommended. If further delineation were clinically desired MRCP or ERCP could be performed. Central line tip within the right atrium. Aortic Atherosclerosis (ICD10-I70.0). Electronically Signed   By: Genia Del M.D.   On: 11/04/2017 14:25    EKG: Orders placed or performed during the hospital encounter of 10/11/17  . ED EKG 12-Lead  . ED EKG 12-Lead    IMPRESSION AND PLAN: Patient is 72 year old African-American female with metastatic breast cancer with abdominal pain nausea vomiting  1.  Abdominal pain nausea vomiting due to progressive metastatic breast cancer Seen by oncology recommends palliative care to treatment I discussed with the patient regarding CODE STATUS she still wants to be a full code she wants her son involved in the discussion her son will be here tomorrow morning I will give her IV pain meds and antinausea medications as needed  2.  Recent sepsis due to Streptococcus septicemia continue IV ceftriaxone  3.  Hyperlipidemia continue atorvastatin  4.  Neuropathy continue gabapentin  5.  Breast cancer with metastases as above college he will be following the patient  6.  COPD no acute exacerbation continue home inhalers   All the records are reviewed and case discussed with ED provider. Management plans discussed with the patient, family and they are  in agreement.  CODE STATUS: Code Status History    Date Active Date Inactive Code Status Order ID Comments User Context   10/11/2017 21:03 10/17/2017 19:32 Full Code [id number]  Henreitta Leber, MD Inpatient   09/21/2017 06:33 09/23/2017 17:17 Full Code [id number]  Harrie Foreman, MD Inpatient   08/08/2017 01:34 08/10/2017 00:29 Full Code [id number]  Lance Coon, MD Inpatient   06/09/2017 03:31 06/17/2017 17:15 Full Code [id number]  Harvie Bridge, DO ED   03/04/2017 01:23 03/13/2017 20:56 Full Code [id number]  Harvie Bridge, DO Inpatient   02/24/2017 23:40 02/26/2017 17:06 Full Code [id number]  Lance Coon, MD Inpatient       TOTAL TIME TAKING CARE OF THIS PATIENT: 31mnutes.    SDustin FlockM.D on 11/04/2017 at 6:04 PM  Between 7am to 6pm - Pager - [phone]  After 6pm go to [email or web address] - password [email or web address] [facility]  [phone] CC: Primary care physician; ADonnie Coffin MD

## 2017-11-04 NOTE — ED Notes (Signed)
Pt unable to provide urine specimen at this time. Pt updated on plan to admit to hospital and will call son and notify of care status.

## 2017-11-04 NOTE — Consult Note (Signed)
[place] CONSULT NOTE  Patient Care Team: Donnie Coffin, MD as PCP - General (Family Medicine) Donnie Coffin, MD as Referring Physician (Family Medicine) Christene Lye, MD (General Surgery) Forest Gleason, MD (Oncology) [name], MD as Consulting Physician (Internal Medicine)  CHIEF COMPLAINTS/PURPOSE OF CONSULTATION:  Metastatic breast cancer  HISTORY OF PRESENTING ILLNESS:  Sophia Nelson 72 y.o.  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   Patient had multiple hospitalizations-most recently approximately 2 weeks ago for Streptococcus septicemia/ICU admission-most likely secondary to involvement of the bowel by malignancy.  Patient is currently on IV antibiotics as per ID.   Patient was recently seen in the office with a plan to start-Aromasin plus Afinitor as next line of therapy.   However, patient is currently admitted to the hospital for worsening nausea with vomiting.  And worsening abdominal pain.  Positive for constipation.  Passing gas.   Patient was also evaluated by palliative care at home recently.   ROS: Poor appetite.  Positive for weight loss.  Generalized weakness.  A complete 10 point review of system is done which is negative except mentioned above in history of present illness  MEDICAL HISTORY:  Past Medical History:  Diagnosis Date  . Abdominal distention   . AKI (acute kidney injury) ([place]) 02/24/2017  . Anemia   . Arthritis   . Asthma   . Back pain, chronic 03/15/2017  . Breast cancer ([place]) 2009   RT MASTECTOMY, DCIS  . Cancer of intra-abdominal ([place])    Metastatic breast cancer lobular carcinoma wth recurrence in the abdomen status post resection  . Cancer of left breast ([place]) 08/09/2015   T4 N1 M1 tumor, INVASIVE LOBULAR CARCINOMA.  .  Carcinoma of overlapping sites of left breast in female, estrogen receptor positive ([place]) 03/22/2016  . COPD (chronic obstructive pulmonary disease) ([place])   . Diabetes ([place]) 02/24/2017  . Diabetes mellitus without complication ([place])   . Duodenal obstruction   . Encounter for nasogastric (NG) tube placement   . Gastric outlet obstruction 02/24/2017  . GERD (gastroesophageal reflux disease)   . Headache    h/o migraines as a child  . Hypertension   . Neck pain 04/19/2016  . Pancreatitis, acute 03/04/2017  . Pneumonia 2015  . Recurrent low back pain 03/15/2017  . Shortness of breath dyspnea    with exertion    SURGICAL HISTORY: Past Surgical History:  Procedure Laterality Date  . ABDOMINAL HYSTERECTOMY    . BREAST SURGERY Right 2015   mastectomy  . ESOPHAGOGASTRODUODENOSCOPY N/A 02/25/2017   Procedure: ESOPHAGOGASTRODUODENOSCOPY (EGD);  Surgeon: Jonathon Bellows, MD;  Location: [facility] ENDOSCOPY;  Service: Endoscopy;  Laterality: N/A;  . ESOPHAGOGASTRODUODENOSCOPY (EGD) WITH PROPOFOL N/A 03/06/2017   Procedure: ESOPHAGOGASTRODUODENOSCOPY (EGD) WITH PROPOFOL;  Surgeon: [name], MD;  Location: [facility] ENDOSCOPY;  Service: Endoscopy;  Laterality: N/A;  . GASTROJEJUNOSTOMY N/A 03/08/2017   Procedure: Roux-en-Y gastrojejunostomy Bypass of Gastric Outlet Obstruction, small bowel resection;  Surgeon: Vickie Epley, MD;  Location: [facility] ORS;  Service: General;  Laterality: N/A;  . PORTACATH PLACEMENT Right 08/01/2017   Procedure: INSERTION PORT-A-CATH;  Surgeon: Christene Lye, MD;  Location: [facility] ORS;  Service: General;  Laterality: Right;  . SIMPLE MASTECTOMY WITH AXILLARY SENTINEL NODE BIOPSY Left 02/29/2016   Procedure: SIMPLE MASTECTOMY;  Surgeon: Christene Lye, MD;  Location: [facility]  ORS;  Service: General;  Laterality: Left;  . TUBAL LIGATION      SOCIAL HISTORY: Social History   Socioeconomic History  . Marital status: Divorced    Spouse name: Not on file  . Number of children: Not  on file  . Years of education: Not on file  . Highest education level: Not on file  Social Needs  . Financial resource strain: Not on file  . Food insecurity - worry: Not on file  . Food insecurity - inability: Not on file  . Transportation needs - medical: Not on file  . Transportation needs - non-medical: Not on file  Occupational History  . Not on file  Tobacco Use  . Smoking status: Former Smoker    Packs/day: 0.25    Years: 15.00    Pack years: 3.75    Types: Cigarettes    Last attempt to quit: 07/23/2017    Years since quitting: 0.2  . Smokeless tobacco: Never Used  . Tobacco comment: currently as of 02-21-16 pt states she is only smoking 2-3 cigarettes per day  Substance and Sexual Activity  . 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 Drug use: No  . Sexual activity: Not on file  Other Topics Concern  . Not on file  Social History Narrative  . Not on file    FAMILY HISTORY: Family History  Problem Relation Age of Onset  . Lung cancer Father   . Cancer Maternal Aunt   . Dementia Mother   . Breast cancer Neg Hx     ALLERGIES:  is allergic to other.  MEDICATIONS:  Current Facility-Administered Medications  Medication Dose Route Frequency Provider Last Rate Last Dose  . iopamidol (ISOVUE-300) 61 % injection 75 mL  75 mL Intravenous Once PRN Carrie Mew, MD       Current Outpatient Medications  Medication Sig Dispense Refill  . ADVAIR DISKUS 500-50 MCG/DOSE AEPB Inhale 1 puff into the lungs 2 (two) times daily.     [name] aspirin EC 81 MG tablet Take 81 mg by mouth daily.    [name] atorvastatin (LIPITOR) 10 MG tablet Take 10 mg by mouth every evening.     . cefTRIAXone (ROCEPHIN) IVPB Inject 2 g into the vein daily. Indication:  Streptococcus gallolyticus bacteremia and presumed portacath infection Last Day of Therapy:  11/11/17 Labs - Once weekly:   CBC/D and BMP, Labs - Every other week:  ESR and CRP 27 Units 0  . docusate sodium (COLACE) 100 MG capsule Take 100 mg by mouth 2 (two) times daily.    . feeding supplement, ENSURE ENLIVE, (ENSURE ENLIVE) LIQD Take 237 mLs by mouth 3 (three) times daily between meals. 237 mL 12  . gabapentin (NEURONTIN) 300 MG capsule Take 300 mg by mouth 2 (two) times daily.    [name] loratadine (CLARITIN) 10 MG tablet Take 10 mg by mouth daily.    . Magnesium Cl-Calcium Carbonate (SLOW MAGNESIUM/CALCIUM) 70-117 MG TBEC Take 1 tablet by mouth 2 (two) times daily. 60 tablet 6  . metoCLOPramide (REGLAN) 10 MG tablet Take 1 tablet (10 mg total) by mouth 4 (four) times daily. 360 tablet 0  . montelukast (SINGULAIR) 10 MG tablet Take 10 mg by mouth at bedtime.    [name] morphine (MS CONTIN) 30 MG 12 hr tablet Take 1 tablet (30 mg total)  by mouth every 12 (twelve) hours. 60 tablet 0  . omeprazole (PRILOSEC) 20 MG capsule Take 20 mg by mouth every morning.     [name] oxyCODONE-acetaminophen (PERCOCET/ROXICET) 5-325 MG tablet Take 1 tablet by mouth every 8 (eight) hours as needed for severe pain. 90 tablet 0  . potassium chloride SA (K-DUR,KLOR-CON) 20 MEQ tablet 1 pill twice a day (Patient taking differently: Take 20 mEq by mouth daily. ) 180 tablet 0  . thiamine 100 MG tablet Take 1 tablet (100 mg total) by mouth daily. 90 tablet 0  . tiotropium (SPIRIVA) 18 MCG inhalation capsule Place 1 capsule (18 mcg total) into inhaler and inhale daily. 30 capsule 12  . vitamin B-12 500 MCG tablet Take 1 tablet (500 mcg total) by mouth daily. 90 tablet 0  . albuterol (PROVENTIL HFA;VENTOLIN HFA) 108 (90 Base) MCG/ACT inhaler Inhale 2 puffs into the lungs every 6 (six) hours as needed for wheezing or shortness of breath.    . cefTRIAXone 2 g in sodium chloride 0.9 % 100 mL Inject 2 g into the vein daily. (Patient not taking: Reported on 11/04/2017) 26 Units 0  . dexamethasone (DECADRON) 0.5 MG/5ML solution Take 10 mLs (1 mg total) by mouth 4 (four)  times daily. Swish solution for 2 minutes then spit. 500 mL 4  . everolimus (AFINITOR) 5 MG tablet Take 1 tablet (5 mg total) by mouth daily. 30 tablet 1  . exemestane (AROMASIN) 25 MG tablet Take 1 tablet (25 mg total) by mouth daily after breakfast. 30 tablet 6  . fluticasone (FLONASE) 50 MCG/ACT nasal spray Place 2 sprays into both nostrils daily.    [name] lidocaine-prilocaine (EMLA) cream Apply 1 application topically as needed (for port access).     [name] loperamide (IMODIUM) 2 MG capsule One pill after each loose stool; maximum upto 8 pills a day. (Patient not taking: Reported on 10/28/2017) 40 capsule 0  . ondansetron (ZOFRAN ODT) 8 MG disintegrating tablet Take 1 tablet (8 mg total) by mouth every 8 (eight) hours as needed for nausea or vomiting. (Patient not taking: Reported on 10/28/2017) 20 tablet 3  . prochlorperazine (COMPAZINE) 10 MG tablet Take 1 tablet (10 mg total) by mouth every 6 (six) hours as needed for nausea or vomiting. (Patient not taking: Reported on 10/28/2017) 30 tablet 3  . senna-docusate (SENOKOT-S) 8.6-50 MG tablet Take 1 tablet by mouth at bedtime as needed for mild constipation. (Patient not taking: Reported on 11/04/2017)        .  PHYSICAL EXAMINATION:  Vitals:   11/04/17 1630 11/04/17 1700  BP: 134/88 (!) 141/85  Pulse: 80 93  Resp:    Temp:    SpO2: 100% 100%   Filed Weights   11/04/17 1107  Weight: 112 lb (50.8 kg)    GENERAL: Frail cachectic appearing female patient resting in the bed.  Alert, no distress and comfortable.  Alone. EYES: no pallor or icterus OROPHARYNX: no thrush or ulceration. NECK: supple, no masses felt LYMPH:  no palpable lymphadenopathy in the cervical, axillary or inguinal regions LUNGS: decreased breath sounds to auscultation at bases and  No wheeze or crackles HEART/CVS: regular rate & rhythm and no murmurs; No lower extremity edema ABDOMEN: abdomen soft, distended mildly tender and normal bowel sounds Musculoskeletal:no cyanosis of  digits and no clubbing  PSYCH: alert & oriented x 3 with fluent speech NEURO: no focal motor/sensory deficits SKIN:  no rashes or significant lesions  LABORATORY DATA:  I have reviewed the data as  listed Lab Results  Component Value Date   WBC 8.2 11/04/2017   HGB 10.3 (L) 11/04/2017   HCT 32.8 (L) 11/04/2017   MCV 84.9 11/04/2017   PLT 501 (H) 11/04/2017   Recent Labs    03/03/17 2140  03/06/17 1808  08/07/17 1647  10/04/17 1004  10/11/17 1522  10/14/17 0501 10/15/17 0445 11/04/17 1300  NA 137   < >  --    < > 137   < > 139   < > 140   < > 141 140 138  K 4.0   < >  --    < > 3.6   < > 3.7   < > 2.9*   < > 3.0* 3.4* 3.6  CL 91*   < >  --    < > 108   < > 105   < > 98*   < > 110 108 97*  CO2 32   < >  --    < > 21*   < > 27   < > 29   < > 24 26 26   GLUCOSE 133*   < >  --    < > 101*   < > 96   < > 116*   < > 99 79 83  BUN 45*   < >  --    < > 8   < > 10   < > <5*   < > 6 6 12   CREATININE 1.49*   < >  --    < > 0.37*   < > 0.45   < > 0.45   < > 0.38* 0.35* 0.57  CALCIUM 9.0   < >  --    < > 7.7*   < > 8.9   < > 9.1   < > 7.8* 8.3* 9.0  GFRNONAA 35*   < >  --    < > >60   < > >60   < > >60   < > >60 >60 >60  GFRAA 40*   < >  --    < > >60   < > >60   < > >60   < > >60 >60 >60  PROT 6.7  --  6.6   < > 5.1*   < > 6.0*  --  6.2*  --   --   --  6.9  ALBUMIN 3.4*  --  3.1*   < > 2.3*   < > 2.9*  --  2.7*  --   --   --  2.9*  AST 23  --  21   < > 106*   < > 20  --  224*  --   --   --  24  ALT 11*  --  12*   < > 20   < > 8*  --  44  --   --   --  7*  ALKPHOS 70  --  67   < > 209*   < > 78  --  390*  --   --   --  132*  BILITOT 1.0  --  0.7   < > 1.5*   < > 0.6  --  1.9*  --   --   --  1.9*  BILIDIR 0.1  --  <0.1*  --  0.8*  --   --   --   --   --   --   --   --  IBILI 0.9  --  NOT CALCULATED  --  0.7  --   --   --   --   --   --   --   --    < > = values in this interval not displayed.    RADIOGRAPHIC STUDIES: I have personally reviewed the radiological images as listed and  agreed with the findings in the report. Ct Head Wo Contrast  Result Date: 10/12/2017 CLINICAL DATA:  Altered level of consciousness. EXAM: CT HEAD WITHOUT CONTRAST TECHNIQUE: Contiguous axial images were obtained from the base of the skull through the vertex without intravenous contrast. COMPARISON:  06/09/2017 FINDINGS: Brain: There is no evidence for acute hemorrhage, hydrocephalus, mass lesion, or abnormal extra-axial fluid collection. No definite CT evidence for acute infarction. Vascular: No hyperdense vessel or unexpected calcification. Skull: No evidence for fracture. No worrisome lytic or sclerotic lesion. Sinuses/Orbits: The visualized paranasal sinuses and mastoid air cells are clear. Visualized portions of the globes and intraorbital fat are unremarkable. Other: None. IMPRESSION: Stable. Negative head CT without new or acute intracranial abnormality. Electronically Signed   By: Misty Stanley M.D.   On: 10/12/2017 12:20   Ct Abdomen Pelvis W Contrast  Result Date: 11/04/2017 CLINICAL DATA:  72 year old female with metastatic breast cancer with abdominal and small bowel involvement. Post Roux-en-Y for bowel obstruction. Now presenting with fever and form stomach. Abdominal pain with vomiting. Prior hysterectomy. Subsequent encounter. EXAM: CT ABDOMEN AND PELVIS WITH CONTRAST TECHNIQUE: Multidetector CT imaging of the abdomen and pelvis was performed using the standard protocol following bolus administration of intravenous contrast. CONTRAST:  75 cc Isovue 300. COMPARISON:  10/11/2017 CT. FINDINGS: Lower chest: No worrisome abnormality. Central line tip within the right atrium. Hepatobiliary: Intra and extrahepatic biliary duct dilation. No calcified common bile duct stone or obvious pancreatic mass seen as cause of obstruction. Correlation with liver enzymes recommended. If further delineation were clinically desired MRCP or ERCP could be performed. Slightly heterogeneous liver without worrisome  mass. Pancreas: No focal pancreatic mass. Spleen: No splenic mass or enlargement. Adrenals/Urinary Tract: Persistent mild right-sided hydronephrosis may be caused by metastatic involvement of the right posterolateral pelvis narrowing the right ureter. Minimal fullness left renal collecting system. No mild prominence left adrenal gland without significant nodularity. Right adrenal gland unremarkable. Partially decompressed urinary bladder with multiple nodules suggests of metastatic involvement of the bladder wall. Stomach/Bowel: Post Roux-en-Y. 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 Metastatic involvement of the small bowel near the Roux-en-Y with proximal obstruction. Metastatic involvement of the ileum. Stomach, small bowel and colonic wall thickening and ascites without free intraperitoneal air. This may reflect underlying enterocolitis given patient's fever. Vascular/Lymphatic: Prominent atherosclerotic changes aorta and aortic branch vessels. No aortic aneurysm or large vessel occlusion. Prominent narrowing celiac artery origin and common iliac artery origin bilaterally. Scattered small to slightly enlarged lymph nodes most notable left periaortic region. Reproductive: Post hysterectomy. Other: 4 cm ill-defined area posterior right pelvis is previously noted suspicious for metastatic involvement. Third spacing of fluid. Peritoneal implants suspected. No free intraperitoneal air or bowel containing hernia. Musculoskeletal: Degenerative changes lumbar spine with Schmorl's node deformity superior endplate L3. No osseous destructive lesion. IMPRESSION: Post Roux-en-Y. 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 Metastatic involvement of the small bowel near the Roux-en-Y with proximal obstruction. Metastatic involvement of the ileum Stomach, small bowel and colonic wall  thickening and ascites  may reflect underlying enterocolitis given patient's fever. Persistent mild right-sided hydronephrosis may be caused by metastatic disease of the right posterolateral pelvis (spanning over 4 cm) and subsequent narrowing the right ureter. Partially decompressed urinary bladder with multiple nodules suggests of metastatic involvement of the bladder wall. Third spacing of fluid with peritoneal implants suspected. Intra and extrahepatic biliary duct dilation. No calcified common bile duct stone or obvious pancreatic mass seen as cause of obstruction. Correlation with liver enzymes recommended. If further delineation were clinically desired MRCP or ERCP could be performed. Central line tip within the right atrium. Aortic Atherosclerosis (ICD10-I70.0). Electronically Signed   By: Genia Del M.D.   On: 11/04/2017 14:25   Ct Abdomen Pelvis W Contrast  Result Date: 10/11/2017 CLINICAL DATA:  Metastatic breast cancer with abdominal and small bowel involvement. Status post Roux-en-Y for bowel obstruction EXAM: CT ABDOMEN AND PELVIS WITH CONTRAST TECHNIQUE: Multidetector CT imaging of the abdomen and pelvis was performed using the standard protocol following bolus administration of intravenous contrast. CONTRAST:  16m ISOVUE-300 IOPAMIDOL (ISOVUE-300) INJECTION 61% COMPARISON:  09/22/2017 FINDINGS: Lower chest: Unremarkable. Hepatobiliary: No focal abnormality in the liver parenchyma. There is no evidence for gallstones, gallbladder wall thickening, or pericholecystic fluid. No intrahepatic or extrahepatic biliary dilation. Pancreas: No focal mass lesion. No dilatation of the main duct. No intraparenchymal cyst. No peripancreatic edema. Spleen: No splenomegaly. No focal mass lesion. Adrenals/Urinary Tract: No adrenal nodule or mass. Similar appearance mild right hydronephrosis mild fullness left intrarenal collecting system is stable. Circumferential bladder wall thickening is slightly more  prominent than before. Stomach/Bowel: Status post gastric bypass. There is edema in the retroperitoneal tissues around the transverse duodenum and duodenal bulb appears distended. These features are similar to prior study. No overt small bowel obstruction. Terminal ileum shows hyperenhancement. Focus of hyperenhancement identified in small bowel anterior left abdomen (image 46 series 2). As on the prior study, there is wall thickening and hyperenhancement in the ascending colon. Colon is diffusely decompressed. Vascular/Lymphatic: There is abdominal aortic atherosclerosis without aneurysm. There is no gastrohepatic or hepatoduodenal ligament lymphadenopathy. No intraperitoneal or retroperitoneal lymphadenopathy. No pelvic sidewall lymphadenopathy. Reproductive: Uterus surgically absent. Other: Ill-defined heterogeneously enhancing lesion is identified in the right posterior pelvis (image 50 series 2) measuring 3.8 x 4.4 cm. The ureter can be seen passing posterior to this suggesting that it is along the posterior peritoneal surface. Metastatic lesions suspected. Hematoma or abscess considered less likely but not excluded. Musculoskeletal: Bone windows reveal no worrisome lytic or sclerotic osseous lesions. IMPRESSION: 1. Interval development 3.8 x 4.4 cm heterogeneously enhancing lesion posterior right pelvis suspicious for metastatic involvement. This does have some low attenuation and hematoma or abscess would be considered less likely possibilities. Close follow-up recommended. 2. As on prior study, there are multiple areas of abnormal enhancement in the bowel of the left abdomen. Abnormal wall thickening and enhancement is seen in the terminal ileum and right colon as well. Imaging features suggest metastatic involvement. 3. Mild duodenal distention with edema in the retroperitoneal tissues around the duodenum. 4. Small volume intraperitoneal free fluid 5. Similar appearance of right greater than left  hydronephrosis. Electronically Signed   By: EMisty StanleyM.D.   On: 10/11/2017 17:28   Dg Chest Port 1 View  Result Date: 10/15/2017 CLINICAL DATA:  Respiratory failure EXAM: PORTABLE CHEST 1 VIEW COMPARISON:  10/11/2017 FINDINGS: Right Port-A-Cath remains in place, unchanged. Layering small bilateral effusions, left greater than right with bibasilar atelectasis, left greater than right. Heart is upper  limits normal in size. No acute bony abnormality. IMPRESSION: New small bilateral layering effusions and bibasilar atelectasis, left greater than right. Electronically Signed   By: Rolm Baptise M.D.   On: 10/15/2017 07:15   Dg Chest Port 1 View  Result Date: 10/11/2017 CLINICAL DATA:  Shortness of breath with fever and chills. EXAM: PORTABLE CHEST 1 VIEW COMPARISON:  09/21/2017 FINDINGS: 1535 hours. Lungs are hyperexpanded. The lungs are clear without focal pneumonia, edema, pneumothorax or pleural effusion. The cardiopericardial silhouette is within normal limits for size. Right Port-A-Cath is stable. The visualized bony structures of the thorax are intact. IMPRESSION: Stable.  No acute findings. Electronically Signed   By: Misty Stanley M.D.   On: 10/11/2017 15:56    ASSESSMENT & PLAN:   #72 year old female patient with a history of metastatic breast cancer currently admitted to the hospital for abdominal pain/nausea vomiting  #Metastatic breast cancer/lobular ER PR positive-metastatic to predominant GI/small bowel/pelvic metastases/omental metastases-status post multiple lines of therapy.  Most recently patient progressed on Taxol.  Given the frequent infections/declining performance status progressive disease-I would not recommend any further therapy.  I think further systemic therapy would put patient at high risk of side effects and benefits-hence I would recommend palliative care evaluation/hospice.  #Pain second malignancy-continue narcotic pain medication.  #CODE STATUS: Long discussion  with the patient regarding CODE STATUS; the futility of full code/aggressive measures-as this would cause more pain/trauma.  Patient undecided.    #I would recommend admission to the hospital for pain control/; evaluation by palliative care services tomorrow/transition to hospice.   Thank you Dr. Joni Fears for allowing me to participate in the care of your pleasant patient.  Also spoke to patient's son Kelby Fam over the phone.  He agrees of a plan of care.    [name], MD 11/04/2017 6:18 PM

## 2017-11-04 NOTE — ED Notes (Signed)
Patient has a R chest port a cath and requests blood draws to be done through there.  Blood not drawn in triage.

## 2017-11-04 NOTE — ED Notes (Signed)
Per pt right sided port was accessed and flushed by home health RN.

## 2017-11-05 DIAGNOSIS — R1084 Generalized abdominal pain: Secondary | ICD-10-CM

## 2017-11-05 DIAGNOSIS — R7881 Bacteremia: Secondary | ICD-10-CM

## 2017-11-05 DIAGNOSIS — C799 Secondary malignant neoplasm of unspecified site: Secondary | ICD-10-CM

## 2017-11-05 DIAGNOSIS — Z66 Do not resuscitate: Secondary | ICD-10-CM

## 2017-11-05 DIAGNOSIS — Z515 Encounter for palliative care: Secondary | ICD-10-CM

## 2017-11-05 LAB — BLOOD CULTURE ID PANEL (REFLEXED)
Acinetobacter baumannii: NOT DETECTED
CANDIDA ALBICANS: NOT DETECTED
Candida glabrata: NOT DETECTED
Candida krusei: NOT DETECTED
Candida parapsilosis: NOT DETECTED
Candida tropicalis: NOT DETECTED
ENTEROBACTERIACEAE SPECIES: NOT DETECTED
Enterobacter cloacae complex: NOT DETECTED
Enterococcus species: DETECTED — AB
Escherichia coli: NOT DETECTED
HAEMOPHILUS INFLUENZAE: NOT DETECTED
Klebsiella oxytoca: NOT DETECTED
Klebsiella pneumoniae: NOT DETECTED
Listeria monocytogenes: NOT DETECTED
NEISSERIA MENINGITIDIS: NOT DETECTED
PSEUDOMONAS AERUGINOSA: NOT DETECTED
Proteus species: NOT DETECTED
SERRATIA MARCESCENS: NOT DETECTED
STAPHYLOCOCCUS AUREUS BCID: NOT DETECTED
STAPHYLOCOCCUS SPECIES: NOT DETECTED
STREPTOCOCCUS AGALACTIAE: NOT DETECTED
STREPTOCOCCUS PNEUMONIAE: NOT DETECTED
STREPTOCOCCUS PYOGENES: NOT DETECTED
STREPTOCOCCUS SPECIES: NOT DETECTED
VANCOMYCIN RESISTANCE: NOT DETECTED

## 2017-11-05 MED ORDER — ONDANSETRON HCL 4 MG/2ML IJ SOLN: 4.0000 mg | Freq: Four times a day (QID) | INTRAMUSCULAR | Status: DC | PRN

## 2017-11-05 MED ORDER — SODIUM CHLORIDE 0.9 % IV SOLN
2.0000 g | INTRAVENOUS | Status: DC
  Administered 2017-11-05 (×2): 2 g via INTRAVENOUS
  Filled 2017-11-05 (×8): qty 2000

## 2017-11-05 MED ORDER — HYDROMORPHONE HCL 1 MG/ML IJ SOLN
1.0000 mg | INTRAMUSCULAR | Status: DC | PRN
  Administered 2017-11-06: 1 mg via INTRAVENOUS
  Filled 2017-11-05: qty 1

## 2017-11-05 MED ORDER — PROCHLORPERAZINE MALEATE 10 MG PO TABS
10.0000 mg | ORAL_TABLET | Freq: Four times a day (QID) | ORAL | Status: DC | PRN
  Filled 2017-11-05: qty 1

## 2017-11-05 MED ORDER — ONDANSETRON 8 MG PO TBDP
8.0000 mg | ORAL_TABLET | Freq: Three times a day (TID) | ORAL | Status: DC | PRN
  Filled 2017-11-05: qty 1

## 2017-11-05 MED ORDER — MORPHINE SULFATE (CONCENTRATE) 10 MG/0.5ML PO SOLN
10.0000 mg | ORAL | Status: DC | PRN
  Administered 2017-11-05: 10 mg via SUBLINGUAL
  Filled 2017-11-05: qty 1

## 2017-11-05 MED ORDER — HYDROMORPHONE HCL 1 MG/ML IJ SOLN
1.0000 mg | INTRAMUSCULAR | Status: DC | PRN
Start: 2017-11-05 — End: 2017-11-05
  Administered 2017-11-05: 1 mg via INTRAVENOUS
  Filled 2017-11-05: qty 1

## 2017-11-05 MED ORDER — OXYCODONE-ACETAMINOPHEN 5-325 MG PO TABS
1.0000 | ORAL_TABLET | Freq: Three times a day (TID) | ORAL | Status: DC | PRN
  Administered 2017-11-05 – 2017-11-06 (×3): 1 via ORAL
  Filled 2017-11-05 (×3): qty 1

## 2017-11-05 MED ORDER — DICYCLOMINE HCL 10 MG/5ML PO SOLN
40.0000 mg | Freq: Three times a day (TID) | ORAL | Status: DC
  Administered 2017-11-05 – 2017-11-07 (×8): 40 mg via ORAL
  Filled 2017-11-05 (×12): qty 20

## 2017-11-05 MED ORDER — AMPICILLIN-SULBACTAM SODIUM 3 (2-1) G IJ SOLR
3.0000 g | Freq: Four times a day (QID) | INTRAMUSCULAR | Status: DC
  Administered 2017-11-05 – 2017-11-07 (×7): 3 g via INTRAVENOUS
  Filled 2017-11-05 (×11): qty 3

## 2017-11-05 MED ORDER — MORPHINE SULFATE ER 30 MG PO TBCR
30.0000 mg | EXTENDED_RELEASE_TABLET | Freq: Three times a day (TID) | ORAL | Status: DC
  Administered 2017-11-05 – 2017-11-07 (×5): 30 mg via ORAL
  Filled 2017-11-05 (×6): qty 1

## 2017-11-05 NOTE — Progress Notes (Signed)
PHARMACY - PHYSICIAN COMMUNICATION CRITICAL VALUE ALERT - BLOOD CULTURE IDENTIFICATION (BCID)  Results for orders placed or performed during the hospital encounter of 11/04/17  Blood Culture ID Panel (Reflexed) (Collected: 11/04/2017  1:01 PM)  Result Value Ref Range   Enterococcus species DETECTED (A) NOT DETECTED   Vancomycin resistance NOT DETECTED NOT DETECTED   Listeria monocytogenes NOT DETECTED NOT DETECTED   Staphylococcus species NOT DETECTED NOT DETECTED   Staphylococcus aureus NOT DETECTED NOT DETECTED   Streptococcus species NOT DETECTED NOT DETECTED   Streptococcus agalactiae NOT DETECTED NOT DETECTED   Streptococcus pneumoniae NOT DETECTED NOT DETECTED   Streptococcus pyogenes NOT DETECTED NOT DETECTED   Acinetobacter baumannii NOT DETECTED NOT DETECTED   Enterobacteriaceae species NOT DETECTED NOT DETECTED   Enterobacter cloacae complex NOT DETECTED NOT DETECTED   Escherichia coli NOT DETECTED NOT DETECTED   Klebsiella oxytoca NOT DETECTED NOT DETECTED   Klebsiella pneumoniae NOT DETECTED NOT DETECTED   Proteus species NOT DETECTED NOT DETECTED   Serratia marcescens NOT DETECTED NOT DETECTED   Haemophilus influenzae NOT DETECTED NOT DETECTED   Neisseria meningitidis NOT DETECTED NOT DETECTED   Pseudomonas aeruginosa NOT DETECTED NOT DETECTED   Candida albicans NOT DETECTED NOT DETECTED   Candida glabrata NOT DETECTED NOT DETECTED   Candida krusei NOT DETECTED NOT DETECTED   Candida parapsilosis NOT DETECTED NOT DETECTED   Candida tropicalis NOT DETECTED NOT DETECTED   Patient has recent history of blood stream infections - E. Coli in 1 of 4 bottles from 09/21/17 and streptococcus gallolyticus in 4 of 4 bottles from 10/12/17. She has been on ceftriaxone 2 gm IV Q24H with a stop date of 11/11/17. Now lab reports enterococcus species in 1 of 4 bottles. Cephalosporins do not cover enterococcus species. Fortunately the E. Coli was ampicillin sensitive and the streptococcus was  penicillin sensitive. Switching to ampicillin should provide adequate coverage for the E. Coli and streptococcus and good empiric coverage for the enterococcus.  Name of physician (or Provider) Contacted: Dr. Darvin Neighbours paged x 1 at 07:45  Changes to prescribed antibiotics required: Recommend changing to ampicillin 2 gm IV Q4H. Monitor renal function because dose reductions are recommended for CrCl less than 50 mL/min. Dr. Darvin Neighbours is okay with the change.  Laural Benes, Pharm.D., BCPS Clinical Pharmacist 11/05/2017  7:46 AM

## 2017-11-05 NOTE — Consult Note (Signed)
[facility]ious [facility]     Reason for Consult: Enterococcal bacteremia    Referring Physician: Nicholes Mango  Date of Admission:  11/04/2017   Active Problems:   Nausea and vomiting   HPI: Sophia Nelson is a 72 y.o. female with hx of metastatic breast cancer undergoing chemo with recent strep bacteremia, on IV ceftriaxone and flagyl as otpt admitted with NV abd pain. She on admission had temp 99 , wbc 8 .   [place] previous + for Streptococcus gallolyticus.  Now + enterococcus.   She has a portacath in place but no pain at the site.She has CT scan showing continue R pelvis mass suspicious for mets,  No abscess.    Past Medical History:  Diagnosis Date  . Abdominal distention   . AKI (acute kidney injury) ([place]) 02/24/2017  . Anemia   . Arthritis   . Asthma   . Back pain, chronic 03/15/2017  . Breast cancer ([place]) 2009   RT MASTECTOMY, DCIS  . 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 Cancer of left breast ([place]) 08/09/2015   T4 N1 M1 tumor, INVASIVE LOBULAR CARCINOMA.  . Carcinoma of overlapping sites of left breast in female, estrogen receptor positive ([place]) 03/22/2016  . COPD (chronic obstructive pulmonary disease) ([place])   . Diabetes ([place]) 02/24/2017  . Diabetes mellitus without complication ([place])   . Duodenal obstruction   . Encounter for nasogastric (NG) tube placement   . Gastric outlet obstruction 02/24/2017  . GERD (gastroesophageal reflux disease)   . Headache    h/o migraines as a child  . Hypertension   . Neck pain 04/19/2016  . Pancreatitis, acute 03/04/2017  . Pneumonia 2015  . Recurrent low back pain 03/15/2017  . Shortness of breath dyspnea    with exertion   Past Surgical History:  Procedure Laterality Date  . ABDOMINAL HYSTERECTOMY    . BREAST SURGERY Right 2015   mastectomy  . ESOPHAGOGASTRODUODENOSCOPY N/A 02/25/2017   Procedure: ESOPHAGOGASTRODUODENOSCOPY (EGD);   Surgeon: Jonathon Bellows, MD;  Location: [facility] ENDOSCOPY;  Service: Endoscopy;  Laterality: N/A;  . ESOPHAGOGASTRODUODENOSCOPY (EGD) WITH PROPOFOL N/A 03/06/2017   Procedure: ESOPHAGOGASTRODUODENOSCOPY (EGD) WITH PROPOFOL;  Surgeon: [name], MD;  Location: [facility] ENDOSCOPY;  Service: Endoscopy;  Laterality: N/A;  . GASTROJEJUNOSTOMY N/A 03/08/2017   Procedure: Roux-en-Y gastrojejunostomy Bypass of Gastric Outlet Obstruction, small bowel resection;  Surgeon: Vickie Epley, MD;  Location: [facility] ORS;  Service: General;  Laterality: N/A;  . PORTACATH PLACEMENT Right 08/01/2017   Procedure: INSERTION PORT-A-CATH;  Surgeon: Christene Lye, MD;  Location: [facility] ORS;  Service: General;  Laterality: Right;  . SIMPLE MASTECTOMY WITH AXILLARY SENTINEL NODE BIOPSY Left 02/29/2016   Procedure: SIMPLE MASTECTOMY;  Surgeon: Christene Lye, MD;  Location: [facility] ORS;  Service: General;  Laterality: Left;  . TUBAL LIGATION     Social History   Tobacco Use  . Smoking status: Former Smoker    Packs/day: 0.25    Years: 15.00    Pack years: 3.75    Types: Cigarettes    Last attempt to quit: 07/23/2017    Years since quitting: 0.2  . Smokeless tobacco: Never Used  . Tobacco comment: currently as of 02-21-16 pt states she is only smoking 2-3 cigarettes per day  Substance Use Topics  . 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 Drug use: No   Family History  Problem Relation Age of Onset  . Lung cancer Father   . Cancer Maternal Aunt   . Dementia Mother   . Breast cancer Neg Hx     Allergies:  Allergies  Allergen Reactions  . Other Other (See Comments)    Onions(that grow in the yard-pt can eat onions without problems) and dust mite Sneezing, cough, runny nose    Current antibiotics: Antibiotics Given (last 72 hours)    Date/Time Action Medication Dose Rate   11/04/17  1302 New Bag/Given   piperacillin-tazobactam (ZOSYN) IVPB 3.375 g 3.375 g 100 mL/hr   11/04/17 2332 New Bag/Given   cefTRIAXone (ROCEPHIN) 2 g in sodium chloride 0.9 % 100 mL IVPB 2 g 200 mL/hr   11/05/17 0800 New Bag/Given   ampicillin (OMNIPEN) 2 g in sodium chloride 0.9 % 100 mL IVPB 2 g 300 mL/hr   11/05/17 1156 New Bag/Given   ampicillin (OMNIPEN) 2 g in sodium chloride 0.9 % 100 mL IVPB 2 g 300 mL/hr      MEDICATIONS: . aspirin EC  81 mg Oral Daily  . atorvastatin  10 mg Oral QPM  . dexamethasone  1 mg Oral QID  . dicyclomine  40 mg Oral TID AC & HS  . docusate sodium  100 mg Oral BID  . enoxaparin (LOVENOX) injection  40 mg Subcutaneous Q24H  . everolimus  5 mg Oral Daily  . exemestane  25 mg Oral QPC breakfast  . feeding supplement (ENSURE ENLIVE)  237 mL Oral TID BM  . fluticasone  2 spray Each Nare Daily  . gabapentin  300 mg Oral BID  . loratadine  10 mg Oral Daily  . mouth rinse  15 mL Mouth Rinse BID  . metoCLOPramide  10 mg Oral QID  . mometasone-formoterol  2 puff Inhalation BID  . montelukast  10 mg Oral QHS  . morphine  30 mg Oral Q8H  . pantoprazole  40 mg Oral Daily  . sodium chloride flush  10-40 mL Intracatheter Q12H  . thiamine  100 mg Oral Daily  . tiotropium  18 mcg Inhalation Daily  . cyanocobalamin  500 mcg Oral Daily    Review of Systems - 11 systems reviewed and negative per HPI   OBJECTIVE: Temp:  [98 F (36.7 C)-98.4 F (36.9 C)] 98 F (36.7 C) (03/12 1304) Pulse Rate:  [79-93] 90 (03/12 1304) Resp:  [18] 18 (03/12 1304) BP: (103-143)/(60-88) 131/66 (03/12 1304) SpO2:  [95 %-100 %] 95 % (03/12 1304) Physical Exam  Constitutional:  Slowed mentation, interactive. Chronically ill appearing HENT: [place]/AT, PERRLA, pale no scleral icterus Mouth/Throat: Oropharynx is clear and dry . No oropharyngeal exudate.  Cardiovascular: Normal rate, regular rhythm and normal heart sounds. Distant  Pulmonary/Chest: Effort normal and breath sounds normal.  No respiratory distress.  has no wheezes.  Neck = supple, no nuchal rigidity Abdominal: distended, mod ttp, firm . Bowel sounds are normal.    Lymphadenopathy: no cervical adenopathy. No axillary adenopathy Neurological: alert and interactive but slowed mentation  Portacath site R chest wall wn, Skin: Skin is warm and dry. No rash noted. No erythema.  Psychiatric: a normal mood and affect.  behavior is normal.    LABS: Results for orders placed or performed during the hospital encounter of 11/04/17 (from the past 48 hour(s))  Comprehensive metabolic panel     Status:  Abnormal   Collection Time: 11/04/17  1:00 PM  Result Value Ref Range   Sodium 138 135 - 145 mmol/L   Potassium 3.6 3.5 - 5.1 mmol/L   Chloride 97 (L) 101 - 111 mmol/L   CO2 26 22 - 32 mmol/L   Glucose, Bld 83 65 - 99 mg/dL   BUN 12 6 - 20 mg/dL   Creatinine, Ser 0.57 0.44 - 1.00 mg/dL   Calcium 9.0 8.9 - 10.3 mg/dL   Total Protein 6.9 6.5 - 8.1 g/dL   Albumin 2.9 (L) 3.5 - 5.0 g/dL   AST 24 15 - 41 U/L   ALT 7 (L) 14 - 54 U/L   Alkaline Phosphatase 132 (H) 38 - 126 U/L   Total Bilirubin 1.9 (H) 0.3 - 1.2 mg/dL   GFR calc non Af Amer >60 >60 mL/min   GFR calc Af Amer >60 >60 mL/min    Comment: (NOTE) The eGFR has been calculated using the CKD EPI equation. This calculation has not been validated in all clinical situations. eGFR's persistently <60 mL/min signify possible Chronic Kidney Disease.    Anion gap 15 5 - 15    Comment: Performed at [facility], [address] [place], [place] [address]  Lipase, blood     Status: None   Collection Time: 11/04/17  1:00 PM  Result Value Ref Range   Lipase 24 11 - 51 U/L    Comment: Performed at [facility], [address] [place], [place] [address]  CBC with Differential     Status: Abnormal   Collection Time: 11/04/17  1:00 PM  Result Value Ref Range   WBC 8.2 3.6 - 11.0 K/uL   RBC 3.87 3.80 - 5.20 MIL/uL   Hemoglobin 10.3 (L) 12.0 - 16.0  g/dL   HCT 32.8 (L) 35.0 - 47.0 %   MCV 84.9 80.0 - 100.0 fL   MCH 26.5 26.0 - 34.0 pg   MCHC 31.3 (L) 32.0 - 36.0 g/dL   RDW 16.6 (H) 11.5 - 14.5 %   Platelets 501 (H) 150 - 440 K/uL   Neutrophils Relative % 73 %   Neutro Abs 6.0 1.4 - 6.5 K/uL   Lymphocytes Relative 16 %   Lymphs Abs 1.3 1.0 - 3.6 K/uL   Monocytes Relative 8 %   Monocytes Absolute 0.7 0.2 - 0.9 K/uL   Eosinophils Relative 2 %   Eosinophils Absolute 0.1 0 - 0.7 K/uL   Basophils Relative 1 %   Basophils Absolute 0.1 0 - 0.1 K/uL    Comment: Performed at [facility], [address] [place], [place] [address]  Lactic acid, plasma     Status: None   Collection Time: 11/04/17  1:00 PM  Result Value Ref Range   Lactic Acid, Venous 0.7 0.5 - 1.9 mmol/L    Comment: Performed at [facility], [address] [place], [place] [address]  Culture, blood (routine x 2)     Status: None (Preliminary result)   Collection Time: 11/04/17  1:00 PM  Result Value Ref Range   Specimen Description BLOOD RAC    Special Requests      BOTTLES DRAWN AEROBIC AND ANAEROBIC Blood Culture adequate volume   Culture      NO GROWTH < 24 HOURS Performed at [facility], [address] [place], [place] [address]    Report Status PENDING   Culture, blood (routine x 2)     Status: None (Preliminary result)  Collection Time: 11/04/17  1:01 PM  Result Value Ref Range   Specimen Description BLOOD RIGHT ARM    Special Requests      BOTTLES DRAWN AEROBIC AND ANAEROBIC Blood Culture adequate volume   Culture  Setup Time      Organism ID to follow GRAM POSITIVE COCCI AEROBIC BOTTLE ONLY CRITICAL RESULT CALLED TO, READ BACK BY AND VERIFIED WITH: Eniyah Eastmond BESANTI AT 0723 11/05/17 [place] Performed at [facility], [address] [place], [place] [address]    Culture GRAM POSITIVE COCCI    Report Status PENDING   Blood Culture ID Panel (Reflexed)     Status: Abnormal   Collection Time: 11/04/17  1:01 PM   Result Value Ref Range   Enterococcus species DETECTED (A) NOT DETECTED    Comment: CRITICAL RESULT CALLED TO, READ BACK BY AND VERIFIED WITH:  Sharell Hilmer BESANTI AT 0723 11/05/17 SDR    Vancomycin resistance NOT DETECTED NOT DETECTED   Listeria monocytogenes NOT DETECTED NOT DETECTED   Staphylococcus species NOT DETECTED NOT DETECTED   Staphylococcus aureus NOT DETECTED NOT DETECTED   Streptococcus species NOT DETECTED NOT DETECTED   Streptococcus agalactiae NOT DETECTED NOT DETECTED   Streptococcus pneumoniae NOT DETECTED NOT DETECTED   Streptococcus pyogenes NOT DETECTED NOT DETECTED   Acinetobacter baumannii NOT DETECTED NOT DETECTED   Enterobacteriaceae species NOT DETECTED NOT DETECTED   Enterobacter cloacae complex NOT DETECTED NOT DETECTED   Escherichia coli NOT DETECTED NOT DETECTED   Klebsiella oxytoca NOT DETECTED NOT DETECTED   Klebsiella pneumoniae NOT DETECTED NOT DETECTED   Proteus species NOT DETECTED NOT DETECTED   Serratia marcescens NOT DETECTED NOT DETECTED   Haemophilus influenzae NOT DETECTED NOT DETECTED   Neisseria meningitidis NOT DETECTED NOT DETECTED   Pseudomonas aeruginosa NOT DETECTED NOT DETECTED   Candida albicans NOT DETECTED NOT DETECTED   Candida glabrata NOT DETECTED NOT DETECTED   Candida krusei NOT DETECTED NOT DETECTED   Candida parapsilosis NOT DETECTED NOT DETECTED   Candida tropicalis NOT DETECTED NOT DETECTED    Comment: Performed at [facility], [address] [place], [place] [address]   No components found for: ESR, C REACTIVE PROTEIN MICRO: Recent Results (from the past 720 hour(s))  Urine culture     Status: None   Collection Time: 10/11/17  3:22 PM  Result Value Ref Range Status   Specimen Description   Final    URINE, RANDOM Performed at [facility], [address] [place], [place] [address]    Special Requests   Final    NONE Performed at [facility], [address], [place], [place]  [address]    Culture   Final    NO GROWTH Performed at [facility], [place] [address] [place], [place] [address]    Report Status 10/13/2017 FINAL  Final  Culture, blood (Routine x 2)     Status: Abnormal   Collection Time: 10/11/17  3:23 PM  Result Value Ref Range Status   Specimen Description   Final    BLOOD BLOOD LEFT FOREARM Performed at [facility], [address], [place], [place] [address]    Special Requests   Final    BOTTLES DRAWN AEROBIC AND ANAEROBIC Blood Culture adequate volume Performed at [place] [address] [place], [place] [address]    Culture  Setup Time   Final    GRAM POSITIVE COCCI IN BOTH AEROBIC AND ANAEROBIC BOTTLES CRITICAL RESULT CALLED TO, READ  BACK BY AND VERIFIED WITH: Nolen Lindamood BESANTI AT 0272 10/12/17.PMH Performed at [facility], [address] [place], [place] [address]    Culture (A)  Final    STREPTOCOCCUS GALLOLYTICUS SUSCEPTIBILITIES PERFORMED ON PREVIOUS CULTURE WITHIN THE LAST 5 DAYS. Performed at [facility], [place] [address], [place], [place] [address]    Report Status 10/14/2017 FINAL  Final  Culture, blood (Routine x 2)     Status: Abnormal   Collection Time: 10/11/17  3:23 PM  Result Value Ref Range Status   Specimen Description   Final    BLOOD PORTA CATH Performed at [facility], [address] [place], [place] [address]    Special Requests   Final    BOTTLES DRAWN AEROBIC AND ANAEROBIC Blood Culture results may not be optimal due to an excessive volume of blood received in culture bottles Performed at [facility], [address] [place], [place] [address]    Culture  Setup Time   Final    GRAM POSITIVE COCCI IN BOTH AEROBIC AND ANAEROBIC BOTTLES CRITICAL RESULT CALLED TO, READ BACK BY AND VERIFIED WITH: Mehr Depaoli BESANTI AT 6433 10/12/17.PMH Performed at [facility], [place] [address] [place], [place] [address]    Culture STREPTOCOCCUS GALLOLYTICUS (A)   Final   Report Status 10/14/2017 FINAL  Final   Organism ID, Bacteria STREPTOCOCCUS GALLOLYTICUS  Final      Susceptibility   Streptococcus gallolyticus - MIC*    PENICILLIN 0.12 SENSITIVE Sensitive     CEFTRIAXONE 0.25 SENSITIVE Sensitive     ERYTHROMYCIN <=0.12 SENSITIVE Sensitive     LEVOFLOXACIN 2 SENSITIVE Sensitive     VANCOMYCIN 0.25 SENSITIVE Sensitive     * STREPTOCOCCUS GALLOLYTICUS  Blood Culture ID Panel (Reflexed)     Status: Abnormal   Collection Time: 10/11/17  3:23 PM  Result Value Ref Range Status   Enterococcus species NOT DETECTED NOT DETECTED Final   Listeria monocytogenes NOT DETECTED NOT DETECTED Final   Staphylococcus species NOT DETECTED NOT DETECTED Final   Staphylococcus aureus NOT DETECTED NOT DETECTED Final   Streptococcus species DETECTED (A) NOT DETECTED Final    Comment: Not Enterococcus species, Streptococcus agalactiae, Streptococcus pyogenes, or Streptococcus pneumoniae. CRITICAL RESULT CALLED TO, READ BACK BY AND VERIFIED WITH: Jalani Cullifer BESANTI AT 8416 10/12/17.PMH    Streptococcus agalactiae NOT DETECTED NOT DETECTED Final   Streptococcus pneumoniae NOT DETECTED NOT DETECTED Final   Streptococcus pyogenes NOT DETECTED NOT DETECTED Final   Acinetobacter baumannii NOT DETECTED NOT DETECTED Final   Enterobacteriaceae species NOT DETECTED NOT DETECTED Final   Enterobacter cloacae complex NOT DETECTED NOT DETECTED Final   Escherichia coli NOT DETECTED NOT DETECTED Final   Klebsiella oxytoca NOT DETECTED NOT DETECTED Final   Klebsiella pneumoniae NOT DETECTED NOT DETECTED Final   Proteus species NOT DETECTED NOT DETECTED Final   Serratia marcescens NOT DETECTED NOT DETECTED Final   Haemophilus influenzae NOT DETECTED NOT DETECTED Final   Neisseria meningitidis NOT DETECTED NOT DETECTED Final   Pseudomonas aeruginosa NOT DETECTED NOT DETECTED Final   Candida albicans NOT DETECTED NOT DETECTED Final   Candida glabrata NOT DETECTED NOT DETECTED Final    Candida krusei NOT DETECTED NOT DETECTED Final   Candida parapsilosis NOT DETECTED NOT DETECTED Final   Candida tropicalis NOT DETECTED NOT DETECTED Final    Comment: Performed at [facility], [address], [place],  [address]  MRSA PCR Screening     Status: None  Collection Time: 10/12/17  2:23 PM  Result Value Ref Range Status   MRSA by PCR NEGATIVE NEGATIVE Final    Comment:        The GeneXpert MRSA Assay (FDA approved for NASAL specimens only), is one component of a comprehensive MRSA colonization surveillance program. It is not intended to diagnose MRSA infection nor to guide or monitor treatment for MRSA infections. Performed at [facility], [address] [place], [place] [address]   Culture, expectorated sputum-assessment     Status: None   Collection Time: 10/12/17  5:35 PM  Result Value Ref Range Status   Specimen Description EXPECTORATED SPUTUM  Final   Special Requests NONE  Final   Sputum evaluation   Final    Sputum specimen not acceptable for testing.  Please recollect.   C/TONI WALKER AT 0030 10/13/17.PMH Performed at [facility], [address] [place], [place] [address]    Report Status 10/13/2017 FINAL  Final  CULTURE, BLOOD (ROUTINE X 2) w Reflex to ID Panel     Status: None   Collection Time: 10/14/17  5:00 AM  Result Value Ref Range Status   Specimen Description BLOOD RIGHT ANTECUBITAL  Final   Special Requests   Final    BOTTLES DRAWN AEROBIC AND ANAEROBIC Blood Culture adequate volume   Culture   Final    NO GROWTH 5 DAYS Performed at [facility], [address] [place], [place] [address]    Report Status 10/19/2017 FINAL  Final  CULTURE, BLOOD (ROUTINE X 2) w Reflex to ID Panel     Status: None   Collection Time: 10/14/17  5:10 AM  Result Value Ref Range Status   Specimen Description BLOOD PORTA CATH  Final   Special Requests   Final    BOTTLES DRAWN AEROBIC AND ANAEROBIC Blood Culture  adequate volume   Culture   Final    NO GROWTH 5 DAYS Performed at [facility], [address], [place], [place] [address]    Report Status 10/19/2017 FINAL  Final  Culture, blood (routine x 2)     Status: None (Preliminary result)   Collection Time: 11/04/17  1:00 PM  Result Value Ref Range Status   Specimen Description BLOOD RAC  Final   Special Requests   Final    BOTTLES DRAWN AEROBIC AND ANAEROBIC Blood Culture adequate volume   Culture   Final    NO GROWTH < 24 HOURS Performed at [facility], [address] [place], [place] [address]    Report Status PENDING  Incomplete  Culture, blood (routine x 2)     Status: None (Preliminary result)   Collection Time: 11/04/17  1:01 PM  Result Value Ref Range Status   Specimen Description BLOOD RIGHT ARM  Final   Special Requests   Final    BOTTLES DRAWN AEROBIC AND ANAEROBIC Blood Culture adequate volume   Culture  Setup Time   Final    Organism ID to follow GRAM POSITIVE COCCI AEROBIC BOTTLE ONLY CRITICAL RESULT CALLED TO, READ BACK BY AND VERIFIED WITH: Utah Delauder BESANTI AT 0723 11/05/17 [place] Performed at [facility], [address] [place],  [address]    Culture [facility] POSITIVE COCCI  Final   Report Status PENDING  Incomplete  Blood Culture ID Panel (Reflexed)     Status: Abnormal   Collection Time: 11/04/17  1:01 PM  Result Value Ref Range Status   Enterococcus species DETECTED (A) NOT DETECTED Final  Comment: CRITICAL RESULT CALLED TO, READ BACK BY AND VERIFIED WITH:  Delia Slatten BESANTI AT 0723 11/05/17 SDR    Vancomycin resistance NOT DETECTED NOT DETECTED Final   Listeria monocytogenes NOT DETECTED NOT DETECTED Final   Staphylococcus species NOT DETECTED NOT DETECTED Final   Staphylococcus aureus NOT DETECTED NOT DETECTED Final   Streptococcus species NOT DETECTED NOT DETECTED Final   Streptococcus agalactiae NOT DETECTED NOT DETECTED Final   Streptococcus pneumoniae NOT DETECTED NOT  DETECTED Final   Streptococcus pyogenes NOT DETECTED NOT DETECTED Final   Acinetobacter baumannii NOT DETECTED NOT DETECTED Final   Enterobacteriaceae species NOT DETECTED NOT DETECTED Final   Enterobacter cloacae complex NOT DETECTED NOT DETECTED Final   Escherichia coli NOT DETECTED NOT DETECTED Final   Klebsiella oxytoca NOT DETECTED NOT DETECTED Final   Klebsiella pneumoniae NOT DETECTED NOT DETECTED Final   Proteus species NOT DETECTED NOT DETECTED Final   Serratia marcescens NOT DETECTED NOT DETECTED Final   Haemophilus influenzae NOT DETECTED NOT DETECTED Final   Neisseria meningitidis NOT DETECTED NOT DETECTED Final   Pseudomonas aeruginosa NOT DETECTED NOT DETECTED Final   Candida albicans NOT DETECTED NOT DETECTED Final   Candida glabrata NOT DETECTED NOT DETECTED Final   Candida krusei NOT DETECTED NOT DETECTED Final   Candida parapsilosis NOT DETECTED NOT DETECTED Final   Candida tropicalis NOT DETECTED NOT DETECTED Final    Comment: Performed at [facility], [address] [place], [place] [address]    IMAGING: Ct Head Wo Contrast  Result Date: 10/12/2017 CLINICAL DATA:  Altered level of consciousness. EXAM: CT HEAD WITHOUT CONTRAST TECHNIQUE: Contiguous axial images were obtained from the base of the skull through the vertex without intravenous contrast. COMPARISON:  06/09/2017 FINDINGS: Brain: There is no evidence for acute hemorrhage, hydrocephalus, mass lesion, or abnormal extra-axial fluid collection. No definite CT evidence for acute infarction. Vascular: No hyperdense vessel or unexpected calcification. Skull: No evidence for fracture. No worrisome lytic or sclerotic lesion. Sinuses/Orbits: The visualized paranasal sinuses and mastoid air cells are clear. Visualized portions of the globes and intraorbital fat are unremarkable. Other: None. IMPRESSION: Stable. Negative head CT without new or acute intracranial abnormality. Electronically Signed   By: Misty Stanley M.D.   On: 10/12/2017 12:20   Ct Abdomen Pelvis W Contrast  Result Date: 11/04/2017 CLINICAL DATA:  72 year old female with metastatic breast cancer with abdominal and small bowel involvement. Post Roux-en-Y for bowel obstruction. Now presenting with fever and form stomach. Abdominal pain with vomiting. Prior hysterectomy. Subsequent encounter. EXAM: CT ABDOMEN AND PELVIS WITH CONTRAST TECHNIQUE: Multidetector CT imaging of the abdomen and pelvis was performed using the standard protocol following bolus administration of intravenous contrast. CONTRAST:  75 cc Isovue 300. COMPARISON:  10/11/2017 CT. FINDINGS: Lower chest: No worrisome abnormality. Central line tip within the right atrium. Hepatobiliary: Intra and extrahepatic biliary duct dilation. No calcified common bile duct stone or obvious pancreatic mass seen as cause of obstruction. Correlation with liver enzymes recommended. If further delineation were clinically desired MRCP or ERCP could be performed. Slightly heterogeneous liver without worrisome mass. Pancreas: No focal pancreatic mass. Spleen: No splenic mass or enlargement. Adrenals/Urinary Tract: Persistent mild right-sided hydronephrosis may be caused by metastatic involvement of the right posterolateral pelvis narrowing the right ureter. Minimal fullness left renal collecting system. No mild prominence left adrenal gland without significant nodularity. Right adrenal gland unremarkable. Partially decompressed urinary bladder with multiple nodules suggests of metastatic involvement of the bladder wall. Stomach/Bowel: Post Roux-en-Y.  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 Metastatic involvement of the small bowel near the Roux-en-Y with proximal obstruction. Metastatic involvement of the ileum. Stomach, small bowel and colonic wall thickening and ascites without free intraperitoneal air. This may reflect underlying  enterocolitis given patient's fever. Vascular/Lymphatic: Prominent atherosclerotic changes aorta and aortic branch vessels. No aortic aneurysm or large vessel occlusion. Prominent narrowing celiac artery origin and common iliac artery origin bilaterally. Scattered small to slightly enlarged lymph nodes most notable left periaortic region. Reproductive: Post hysterectomy. Other: 4 cm ill-defined area posterior right pelvis is previously noted suspicious for metastatic involvement. Third spacing of fluid. Peritoneal implants suspected. No free intraperitoneal air or bowel containing hernia. Musculoskeletal: Degenerative changes lumbar spine with Schmorl's node deformity superior endplate L3. No osseous destructive lesion. IMPRESSION: Post Roux-en-Y. 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 Metastatic involvement of the small bowel near the Roux-en-Y with proximal obstruction. Metastatic involvement of the ileum Stomach, small bowel and colonic wall thickening and ascites may reflect underlying enterocolitis given patient's fever. Persistent mild right-sided hydronephrosis may be caused by metastatic disease of the right posterolateral pelvis (spanning over 4 cm) and subsequent narrowing the right ureter. Partially decompressed urinary bladder with multiple nodules suggests of metastatic involvement of the bladder wall. Third spacing of fluid with peritoneal implants suspected. Intra and extrahepatic biliary duct dilation. No calcified common bile duct stone or obvious pancreatic mass seen as cause of obstruction. Correlation with liver enzymes recommended. If further delineation were clinically desired MRCP or ERCP could be performed. Central line tip within the right atrium. Aortic Atherosclerosis (ICD10-I70.0). Electronically Signed   By: Genia Del M.D.   On: 11/04/2017 14:25   Ct Abdomen Pelvis W Contrast  Result Date:  10/11/2017 CLINICAL DATA:  Metastatic breast cancer with abdominal and small bowel involvement. Status post Roux-en-Y for bowel obstruction EXAM: CT ABDOMEN AND PELVIS WITH CONTRAST TECHNIQUE: Multidetector CT imaging of the abdomen and pelvis was performed using the standard protocol following bolus administration of intravenous contrast. CONTRAST:  56m ISOVUE-300 IOPAMIDOL (ISOVUE-300) INJECTION 61% COMPARISON:  09/22/2017 FINDINGS: Lower chest: Unremarkable. Hepatobiliary: No focal abnormality in the liver parenchyma. There is no evidence for gallstones, gallbladder wall thickening, or pericholecystic fluid. No intrahepatic or extrahepatic biliary dilation. Pancreas: No focal mass lesion. No dilatation of the main duct. No intraparenchymal cyst. No peripancreatic edema. Spleen: No splenomegaly. No focal mass lesion. Adrenals/Urinary Tract: No adrenal nodule or mass. Similar appearance mild right hydronephrosis mild fullness left intrarenal collecting system is stable. Circumferential bladder wall thickening is slightly more prominent than before. Stomach/Bowel: Status post gastric bypass. There is edema in the retroperitoneal tissues around the transverse duodenum and duodenal bulb appears distended. These features are similar to prior study. No overt small bowel obstruction. Terminal ileum shows hyperenhancement. Focus of hyperenhancement identified in small bowel anterior left abdomen (image 46 series 2). As on the prior study, there is wall thickening and hyperenhancement in the ascending colon. Colon is diffusely decompressed. Vascular/Lymphatic: There is abdominal aortic atherosclerosis without aneurysm. There is no gastrohepatic or hepatoduodenal ligament lymphadenopathy. No intraperitoneal or retroperitoneal lymphadenopathy. No pelvic sidewall lymphadenopathy. Reproductive: Uterus surgically absent. Other: Ill-defined heterogeneously enhancing lesion is identified in the right posterior pelvis (image 50  series 2) measuring 3.8 x 4.4 cm. The ureter can be seen passing posterior to this suggesting that it is along the posterior peritoneal surface. Metastatic lesions suspected.  Hematoma or abscess considered less likely but not excluded. Musculoskeletal: Bone windows reveal no worrisome lytic or sclerotic osseous lesions. IMPRESSION: 1. Interval development 3.8 x 4.4 cm heterogeneously enhancing lesion posterior right pelvis suspicious for metastatic involvement. This does have some low attenuation and hematoma or abscess would be considered less likely possibilities. Close follow-up recommended. 2. As on prior study, there are multiple areas of abnormal enhancement in the bowel of the left abdomen. Abnormal wall thickening and enhancement is seen in the terminal ileum and right colon as well. Imaging features suggest metastatic involvement. 3. Mild duodenal distention with edema in the retroperitoneal tissues around the duodenum. 4. Small volume intraperitoneal free fluid 5. Similar appearance of right greater than left hydronephrosis. Electronically Signed   By: Misty Stanley M.D.   On: 10/11/2017 17:28   Dg Chest Port 1 View  Result Date: 10/15/2017 CLINICAL DATA:  Respiratory failure EXAM: PORTABLE CHEST 1 VIEW COMPARISON:  10/11/2017 FINDINGS: Right Port-A-Cath remains in place, unchanged. Layering small bilateral effusions, left greater than right with bibasilar atelectasis, left greater than right. Heart is upper limits normal in size. No acute bony abnormality. IMPRESSION: New small bilateral layering effusions and bibasilar atelectasis, left greater than right. Electronically Signed   By: Rolm Baptise M.D.   On: 10/15/2017 07:15   Dg Chest Port 1 View  Result Date: 10/11/2017 CLINICAL DATA:  Shortness of breath with fever and chills. EXAM: PORTABLE CHEST 1 VIEW COMPARISON:  09/21/2017 FINDINGS: 1535 hours. Lungs are hyperexpanded. The lungs are clear without focal pneumonia, edema, pneumothorax or  pleural effusion. The cardiopericardial silhouette is within normal limits for size. Right Port-A-Cath is stable. The visualized bony structures of the thorax are intact. IMPRESSION: Stable.  No acute findings. Electronically Signed   By: Misty Stanley M.D.   On: 10/11/2017 15:56    Assessment:   Sophia Nelson is a 72 y.o. female with hx of metastatic breast cancer undergoing chemo with recent strep bacteremia, on IV ceftriaxone and flagyl as otpt admitted with NV abd pain. She on admission had temp 99 , wbc 8 .   [place] previous + for Streptococcus gallolyticus.  Now + enterococcus.   She has a portacath in place but no pain at the site.She has CT scan showing continue R pelvis mass suspicious for mets,  No abscess.  She has advancing cancer and is considering hospice.She will remain at persistent risk of recurrent bacteremia given the bowel involvement.   Would cont IV abx until stable then dc on oral abx for several weeks.  Recommendations Cont unasyn At dc can send on augmentin for a 28 day course. Thank you very much for allowing me to participate in the care of this patient. Please call with questions.   Cheral Marker. Ola Spurr, MD

## 2017-11-05 NOTE — Consult Note (Signed)
Consultation Note Date: 11/05/2017   Patient Name: Sophia Nelson  DOB: Sep 12, 1945  MRN: [id number]  Age / Sex: 72 y.o., female  PCP: Sophia Coffin, MD Referring Physician: Hillary Bow, MD  Reason for Consultation: Establishing goals of care  HPI/Patient Profile: 72 y.o. female  with past medical history of breast cancer s/p bilateral mastectomy.  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  She has had recurrent bacteremia x 3 (3 different bacteria - ecoli, strep, and enterococcus), gastric outlet obstruction, DM, AKI, and COPD.  She was admitted on 3/11/2019with nausea and vomiting and was found to have bacteremia.  Due to her physical decline she is not a candidate for further chemotherapy, radiation, or surgery.  Clinical Assessment and Goals of Care:  I have reviewed medical records including EPIC notes, labs and imaging, received report from the care team, assessed the patient and then met at the bedside along with her youngest son Sophia Nelson to discuss diagnosis prognosis, [place], EOL wishes, disposition and options.  I introduced Palliative Medicine as specialized medical care for people living with serious illness. It focuses on providing relief from the symptoms and stress of a serious illness. The goal is to improve quality of life for both the patient and the family.  We discussed a brief life review of the patient.  She is a life long student who earned an associates degree in history at age 85.  She was a Emergency planning/management officer for 30 years.  Still an avid reader.  She is divorced with two adult sons.  She lives with Sophia Nelson.  She has battled cancer for the past 8 years and pain for the past 50 years.    As far as functional and nutritional status she is able to ambulate, but needs some help with ADLs. Today she tells me she feels unable to eat.  Her stomach feels bloated and  hard. Even liquids make her feel sick.  She does not know how she is going to tolerate the pain.  We discussed her current illness and what it means in the larger context of their on-going co-morbidities.  Natural disease trajectory and expectations at EOL were discussed. Specifically we discussed both her metastatic cancer and her 3 episodes of bacteremia.  We discussed what the path forward may look like.  I attempted to elicit values and goals of care important to the patient.  She wants her family (sons) cared for and she wants to be out of pain.  She loves life and would ideally want to live as long as possible, but she does not want to be in pain.  Advanced directives and code status were discussed.  [name] wants to be a DNR.  She wants to be protected from a potential code at end of life.  Hospice and Palliative Care services outpatient were explained and offered.  [name] is hopeful to go home with hospice care.  It is anticipated that she will need Hospice House at end of  life.  Questions and concerns were addressed.   [name] asked me to meet with Sophia Nelson apart from the patient's mother.  Her mother has alzheimers and she did not want her to be upset.  Kourey cares for both [name] and her mother.    I met with Sophia Nelson and his significant other.  Sophia Nelson was very tearful, but he and his significant other understood.  We talked about DNR and hospice care.  I suggested that she will be ready for hospice house when she is unable to walk or eat.  I expressed that I'm concerned this may come quickly.  Sophia Nelson understands and has my card to call with any questions.  After the meetings I spoke with Drs. Sudini and [employer].  The plan is to treat [name] for another day or two with IV antibiotics then switch to oral.  At that time she will be able to go home with hospice.   PMT will continue to reassess for comfort and to determine if she is hospice house appropriate prior  to DC.       Primary Decision Maker:  PATIENT    SUMMARY OF RECOMMENDATIONS    Increase MS Contin to TID. Add IV dilaudid PRN for break thru Prune Juice daily. PMT will reassess 3/13.  Anticipate D/C home with hospice when ready.  She will need EOL Hospice House shortly.  Code Status/Advance Care Planning:  Changed to DNR   Psycho-social/Spiritual:   Desire for further Chaplaincy support: yes  Prognosis:  Days to weeks - advanced metastatic breast cancer causing obstructive symptoms with recurrent bacteremia.    Discharge Planning: To Be Determined Home with hospice vs Hospice House.      Primary Diagnoses: Present on Admission: . Nausea and vomiting   I have reviewed the medical record, interviewed the patient and family, and examined the patient. The following aspects are pertinent.  Past Medical History:  Diagnosis Date  . Abdominal distention   . AKI (acute kidney injury) ([place]) 02/24/2017  . Anemia   . Arthritis   . Asthma   . Back pain, chronic 03/15/2017  . Breast cancer ([place]) 2009   RT MASTECTOMY, DCIS  . 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 Cancer of left breast ([place]) 08/09/2015   T4 N1 M1 tumor, INVASIVE LOBULAR CARCINOMA.  . Carcinoma of overlapping sites of left breast in female, estrogen receptor positive ([place]) 03/22/2016  . COPD (chronic obstructive pulmonary disease) ([place])   . Diabetes ([place]) 02/24/2017  . Diabetes mellitus without complication ([place])   . Duodenal obstruction   . Encounter for nasogastric (NG) tube placement   . Gastric outlet obstruction 02/24/2017  . GERD (gastroesophageal reflux disease)   . Headache    h/o migraines as a child  . Hypertension   . Neck pain 04/19/2016  . Pancreatitis, acute 03/04/2017  . Pneumonia 2015  . Recurrent low back pain 03/15/2017  . Shortness of breath dyspnea    with exertion   Social History    Socioeconomic History  . Marital status: Divorced    Spouse name: None  . Number of children: None  . Years of education: None  . Highest education level: None  Social Needs  . Financial resource strain: None  . Food insecurity - worry: None  . Food insecurity - inability: None  . Transportation needs - medical: None  . Transportation needs - non-medical:  None  Occupational History  . None  Tobacco Use  . Smoking status: Former Smoker    Packs/day: 0.25    Years: 15.00    Pack years: 3.75    Types: Cigarettes    Last attempt to quit: 07/23/2017    Years since quitting: 0.2  . Smokeless tobacco: Never Used  . Tobacco comment: currently as of 02-21-16 pt states she is only smoking 2-3 cigarettes per day  Substance and Sexual Activity  . 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 Drug use: No  . Sexual activity: None  Other Topics Concern  . None  Social History Narrative  . None   Family History  Problem Relation Age of Onset  . Lung cancer Father   . Cancer Maternal Aunt   . Dementia Mother   . Breast cancer Neg Hx    Scheduled Meds: . aspirin EC  81 mg Oral Daily  . atorvastatin  10 mg Oral QPM  . dexamethasone  1 mg Oral QID  . docusate sodium  100 mg Oral BID  . enoxaparin (LOVENOX) injection  40 mg Subcutaneous Q24H  . everolimus  5 mg Oral Daily  . exemestane  25 mg Oral QPC breakfast  . feeding supplement (ENSURE ENLIVE)  237 mL Oral TID BM  . fluticasone  2 spray Each Nare Daily  . gabapentin  300 mg Oral BID  . loratadine  10 mg Oral Daily  . mouth rinse  15 mL Mouth Rinse BID  . metoCLOPramide  10 mg Oral QID  . mometasone-formoterol  2 puff Inhalation BID  . montelukast  10 mg Oral QHS  . morphine  30 mg Oral Q12H  . pantoprazole  40 mg Oral Daily  . sodium chloride flush  10-40 mL Intracatheter Q12H  . thiamine  100 mg Oral  Daily  . tiotropium  18 mcg Inhalation Daily  . cyanocobalamin  500 mcg Oral Daily   Continuous Infusions: . sodium chloride 50 mL/hr at 11/04/17 2107  . ampicillin (OMNIPEN) IV Stopped (11/05/17 0820)   PRN Meds:.albuterol, iopamidol, lidocaine-prilocaine, morphine injection, ondansetron (ZOFRAN) IV, ondansetron, oxyCODONE-acetaminophen, prochlorperazine, senna-docusate, sodium chloride flush Allergies  Allergen Reactions  . Other Other (See Comments)    Onions(that grow in the yard-pt can eat onions without problems) and dust mite Sneezing, cough, runny nose   Review of Systems complains of abdominal distention and pain, eating/drinking is painful.  Physical Exam  Thin frail female, awake, alert, orientated, coherent, cooperative with clear speech. Abdomen distended and hard.  Vital Signs: BP 103/60   Pulse 88   Temp 98.1 F (36.7 C) (Oral)   Resp 18   Ht _0  (1.575 m)   Wt 50.8 kg (112 lb)   SpO2 100%   BMI 20.49 kg/m  Pain Assessment: 0-10   Pain Score: Asleep   SpO2: SpO2: 100 % O2 Device:SpO2: 100 % O2 Flow Rate: .O2 Flow Rate (L/min): 2 L/min  IO: Intake/output summary:   Intake/Output Summary (Last 24 hours) at 11/05/2017 1000 Last data filed at 11/05/2017 0915 Gross per 24 hour  Intake 915.83 ml  Output 450 ml  Net 465.83 ml    LBM: Last BM Date: 11/04/17 Baseline Weight: Weight: 50.8 kg (112 lb) Most recent weight: Weight: 50.8 kg (112 lb)     Palliative Assessment/Data: 20%  Time In: 9:00 Time Out: 10:00 TIme In:  2:30 Time Out:  3:10 Time Total: 100 min Greater than 50%  of this time was spent counseling and coordinating care related to the above assessment and plan.  Signed by: Florentina Jenny, PA-C Palliative Medicine Pager: [phone]  Please contact Palliative Medicine Team phone at [phone] for questions and concerns.  For individual provider: See Shea Evans

## 2017-11-05 NOTE — Progress Notes (Signed)
Pharmacy Antibiotic Note  Sophia Nelson is a 72 y.o. female admitted on 11/04/2017 with intra-abdominal infection and enterococcus bacteremia.  Pharmacy has been consulted for ampicillin/sulbactam dosing.  Plan: Ampicillin/sulbactam 3 g IV q6h  Height: 5\' 2"  (157.5 cm) Weight: 112 lb (50.8 kg) IBW/kg (Calculated) : 50.1  Temp (24hrs), Avg:98.2 F (36.8 C), Min:98 F (36.7 C), Max:98.4 F (36.9 C)  Recent Labs  Lab 11/04/17 1300  WBC 8.2  CREATININE 0.57  LATICACIDVEN 0.7    Estimated Creatinine Clearance: 52.5 mL/min (by C-G formula based on SCr of 0.57 mg/dL).    Allergies  Allergen Reactions  . Other Other (See Comments)    Onions(that grow in the yard-pt can eat onions without problems) and dust mite Sneezing, cough, runny nose    Antimicrobials this admission: Ampicillin/sulbactam 3/12 >>  ampicillin 3/12 >> 3/12 Ceftriaxone 3/11 >  Dose adjustments this admission:  Microbiology results: 3/11 BCx: enterococcus species 3/11 UCx: Sent   Thank you for allowing pharmacy to be a part of this patient's care.  Lenis Noon, PharmD, BCPS Clinical Pharmacist 11/05/2017 3:22 PM

## 2017-11-05 NOTE — Progress Notes (Signed)
[facility] [place] NAME: Sophia Nelson    MR#:  [id number]  DATE OF BIRTH:  08/01/1946  SUBJECTIVE:  Still has nausea. Some improvement in pain today after IV meds. Mother, Son and daughter in [place] at bedside  REVIEW OF SYSTEMS:    Review of Systems  Constitutional: Positive for malaise/fatigue. Negative for chills, fever and weight loss.  HENT: Negative for ear discharge, ear pain and nosebleeds.   Eyes: Negative for blurred vision, pain and discharge.  Respiratory: Negative for sputum production, shortness of breath, wheezing and stridor.   Cardiovascular: Negative for chest pain, palpitations, orthopnea and PND.  Gastrointestinal: Negative for abdominal pain, diarrhea, nausea and vomiting.  Genitourinary: Negative for frequency and urgency.  Musculoskeletal: Negative for back pain and joint pain.  Neurological: Positive for weakness. Negative for sensory change, speech change and focal weakness.  Psychiatric/Behavioral: Negative for depression and hallucinations. The patient is not nervous/anxious.     DRUG ALLERGIES:   Allergies  Allergen Reactions  . Other Other (See Comments)    Onions(that grow in the yard-pt can eat onions without problems) and dust mite Sneezing, cough, runny nose    VITALS:  Blood pressure 132/81, pulse 90, temperature (!) 97.5 F (36.4 C), temperature source Oral, resp. rate 16, height 5\' 2"  (1.575 m), weight 50.8 kg (112 lb), SpO2 99 %.  PHYSICAL EXAMINATION:   Physical Exam  GENERAL:  72 y.o.-year-old patient lying in the bed EYES: Pupils equal, round, reactive to light and accommodation.  HEENT: Head atraumatic, normocephalic. Oropharynx and nasopharynx clear. 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 No thyroid enlargement, no tenderness.  LUNGS: Bibasilar crackles. Right UE port + site looks ok. No tenderness CARDIOVASCULAR: S1, S2.  Tachycardic ABDOMEN: Soft,  nontender, nondistended. Bowel sounds present. No organomegaly or mass.  EXTREMITIES: No cyanosis, clubbing or edema b/l.    NEUROLOGIC: Cranial nerves II through XII are intact. No focal Motor or sensory deficits b/l.   PSYCHIATRIC:  patient is A and O x3  LABORATORY PANEL:   CBC Recent Labs  Lab 11/04/17 1300  WBC 8.2  HGB 10.3*  HCT 32.8*  PLT 501*   ------------------------------------------------------------------------------------------------------------------ Chemistries  Recent Labs  Lab 11/04/17 1300  NA 138  K 3.6  CL 97*  CO2 26  GLUCOSE 83  BUN 12  CREATININE 0.57  CALCIUM 9.0  AST 24  ALT 7*  ALKPHOS 132*  BILITOT 1.9*   ------------------------------------------------------------------------------------------------------------------  Cardiac Enzymes No results for input(s): TROPONINI in the last 168 hours. ------------------------------------------------------------------------------------------------------------------  RADIOLOGY:  Ct Abdomen Pelvis W Contrast  Result Date: 11/04/2017 CLINICAL DATA:  72 year old female with metastatic breast cancer with abdominal and small bowel involvement. Post Roux-en-Y for bowel obstruction. Now presenting with fever and form stomach. Abdominal pain with vomiting. Prior hysterectomy. Subsequent encounter. EXAM: CT ABDOMEN AND PELVIS WITH CONTRAST TECHNIQUE: Multidetector CT imaging of the abdomen and pelvis was performed using the standard protocol following bolus administration of intravenous contrast. CONTRAST:  75 cc Isovue 300. COMPARISON:  10/11/2017 CT. FINDINGS: Lower chest: No worrisome abnormality. Central line tip within the right atrium. Hepatobiliary: Intra and extrahepatic biliary duct dilation. No calcified common bile duct stone or obvious pancreatic mass seen as cause of obstruction. Correlation with liver enzymes recommended. If further delineation were clinically desired MRCP or ERCP could be performed.  Slightly heterogeneous liver without worrisome mass. Pancreas: No focal pancreatic mass. Spleen: No splenic mass or enlargement. Adrenals/Urinary Tract:  Persistent mild right-sided hydronephrosis may be caused by metastatic involvement of the right posterolateral pelvis narrowing the right ureter. Minimal fullness left renal collecting system. No mild prominence left adrenal gland without significant nodularity. Right adrenal gland unremarkable. Partially decompressed urinary bladder with multiple nodules suggests of metastatic involvement of the bladder wall. Stomach/Bowel: Post Roux-en-Y. 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 Metastatic involvement of the small bowel near the Roux-en-Y with proximal obstruction. Metastatic involvement of the ileum. Stomach, small bowel and colonic wall thickening and ascites without free intraperitoneal air. This may reflect underlying enterocolitis given patient's fever. Vascular/Lymphatic: Prominent atherosclerotic changes aorta and aortic branch vessels. No aortic aneurysm or large vessel occlusion. Prominent narrowing celiac artery origin and common iliac artery origin bilaterally. Scattered small to slightly enlarged lymph nodes most notable left periaortic region. Reproductive: Post hysterectomy. Other: 4 cm ill-defined area posterior right pelvis is previously noted suspicious for metastatic involvement. Third spacing of fluid. Peritoneal implants suspected. No free intraperitoneal air or bowel containing hernia. Musculoskeletal: Degenerative changes lumbar spine with Schmorl's node deformity superior endplate L3. No osseous destructive lesion. IMPRESSION: Post Roux-en-Y. 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 Metastatic involvement of the small bowel near the Roux-en-Y with proximal obstruction. Metastatic involvement of the  ileum Stomach, small bowel and colonic wall thickening and ascites may reflect underlying enterocolitis given patient's fever. Persistent mild right-sided hydronephrosis may be caused by metastatic disease of the right posterolateral pelvis (spanning over 4 cm) and subsequent narrowing the right ureter. Partially decompressed urinary bladder with multiple nodules suggests of metastatic involvement of the bladder wall. Third spacing of fluid with peritoneal implants suspected. Intra and extrahepatic biliary duct dilation. No calcified common bile duct stone or obvious pancreatic mass seen as cause of obstruction. Correlation with liver enzymes recommended. If further delineation were clinically desired MRCP or ERCP could be performed. Central line tip within the right atrium. Aortic Atherosclerosis (ICD10-I70.0). Electronically Signed   By: Genia Del M.D.   On: 11/04/2017 14:25     ASSESSMENT AND PLAN:   72 year old female with past medical history of breast cancer currently undergoing chemotherapy, hypertension, diabetes, COPD, previous history of pancreatitis, chronic back pain, previous history of gastric outlet obstruction who presents to the hospital due to fever, nausea and vomiting with abdominal pain  * Enterococcus bacteremia likely due to mets to bowel Changed to Unasyn. Discussed with ID Dr. Ola Spurr. Likely d/c home in 1-2 days on Augmentin  Recent Streptococcus and E coli bacteremias.  * Acute toxic metabolic encephalopathy. Improving  * Metastatic breast cancer.  Follows with cancer center as outpatient. No plans for any further chemotherapy  * Pain management with metastasis Appreciate palliative care input Home with hospice  * Anemia of chronic disease is stable  *DVT prophylaxis with Lovenox  All the records are reviewed and case discussed with Care Management/Social Worker Management plans discussed with the patient and they are in agreement.  CODE STATUS:  FULL CODE  DVT Prophylaxis: SCDs  TOTAL TIME TAKING CARE OF THIS PATIENT: 30 minutes.   Neita Carp M.D on 11/05/2017 at 8:45 PM  Between 7am to 6pm - Pager - [phone]  After 6pm go to [email or web address] - password [email or web address] [facility]  Office  [phone]  CC: Primary care physician; Donnie Coffin, MD  Note: This dictation was prepared with Dragon dictation along with smaller phrase  technology. Any transcriptional errors that result from this process are unintentional.

## 2017-11-06 DIAGNOSIS — Z66 Do not resuscitate: Secondary | ICD-10-CM

## 2017-11-06 LAB — GLUCOSE, CAPILLARY: Glucose-Capillary: 152 mg/dL — ABNORMAL HIGH (ref 65–99)

## 2017-11-06 MED ORDER — HYDRALAZINE HCL 20 MG/ML IJ SOLN
10.0000 mg | Freq: Once | INTRAMUSCULAR | Status: AC
  Administered 2017-11-06: 10 mg via INTRAVENOUS
  Filled 2017-11-06: qty 1

## 2017-11-06 MED ORDER — POLYETHYLENE GLYCOL 3350 17 G PO PACK
17.0000 g | PACK | Freq: Every day | ORAL | Status: DC
  Administered 2017-11-06 – 2017-11-07 (×2): 17 g via ORAL
  Filled 2017-11-06 (×2): qty 1

## 2017-11-06 MED ORDER — GLYCERIN (LAXATIVE) 2.1 G RE SUPP
1.0000 | Freq: Once | RECTAL | Status: AC
  Administered 2017-11-06: 21:00:00 1 via RECTAL
  Filled 2017-11-06: qty 1

## 2017-11-06 MED ORDER — SENNA 8.6 MG PO TABS
2.0000 | ORAL_TABLET | Freq: Every day | ORAL | Status: DC
  Administered 2017-11-06: 21:00:00 17.2 mg via ORAL
  Filled 2017-11-06: qty 2

## 2017-11-06 NOTE — Plan of Care (Signed)
  Progressing Education: Knowledge of General Education information will improve 11/06/2017 2340 - Progressing by Johnna Acosta, RN Health Behavior/Discharge Planning: Ability to manage health-related needs will improve 11/06/2017 2340 - Progressing by Johnna Acosta, RN Clinical Measurements: Ability to maintain clinical measurements within normal limits will improve 11/06/2017 2340 - Progressing by Johnna Acosta, RN Will remain free from infection 11/06/2017 2340 - Progressing by Johnna Acosta, RN Diagnostic test results will improve 11/06/2017 2340 - Progressing by Johnna Acosta, RN Respiratory complications will improve 11/06/2017 2340 - Progressing by Johnna Acosta, RN Cardiovascular complication will be avoided 11/06/2017 2340 - Progressing by Johnna Acosta, RN Activity: Risk for activity intolerance will decrease 11/06/2017 2340 - Progressing by Johnna Acosta, RN Coping: Level of anxiety will decrease 11/06/2017 2340 - Progressing by Johnna Acosta, RN Elimination: Will not experience complications related to urinary retention 11/06/2017 2340 - Progressing by Johnna Acosta, RN Pain Managment: General experience of comfort will improve 11/06/2017 2340 - Progressing by Johnna Acosta, RN Safety: Ability to remain free from injury will improve 11/06/2017 2340 - Progressing by Johnna Acosta, RN Skin Integrity: Risk for impaired skin integrity will decrease 11/06/2017 2340 - Progressing by Johnna Acosta, RN   Not Progressing Nutrition: Adequate nutrition will be maintained 11/06/2017 2340 - Not Progressing by Johnna Acosta, RN Elimination: Will not experience complications related to bowel motility 11/06/2017 2340 - Not Progressing by Johnna Acosta, RN

## 2017-11-06 NOTE — Plan of Care (Signed)
  Progressing Education: Knowledge of General Education information will improve 11/06/2017 0156 - Progressing by Johnna Acosta, RN Clinical Measurements: Ability to maintain clinical measurements within normal limits will improve 11/06/2017 0156 - Progressing by Johnna Acosta, RN Cardiovascular complication will be avoided 11/06/2017 0156 - Progressing by Johnna Acosta, RN Coping: Level of anxiety will decrease 11/06/2017 0156 - Progressing by Johnna Acosta, RN Skin Integrity: Risk for impaired skin integrity will decrease 11/06/2017 0156 - Progressing by Johnna Acosta, RN

## 2017-11-06 NOTE — Care Management Note (Addendum)
Case Management Note  Patient Details  Name: Sophia Nelson MRN: [id number] Date of Birth: Dec 23, 1945  Subjective/Objective:     Admitted to [facility] with the diagnosis of nausea/vomitting and history of GI Cancer. Lives alone. Son is Sophia Nelson [id number]). Last seen Dr. Clide Deutscher at [facility] about 2 weeks ago. Prescriptions  Are filled at [facility]. [facility] is in the home now every Monday to access port. No skilled nursing. Home oxygen per [employer] x 5 years. Uses 2 liters per nasal cannula continuous. Rolling walker, bedside commode, cane, and Life Alert in the home. Self feed, Self dressing, needs help with baths. No falls. Lost 62 pounds in the last 2.5 years.               Action/Plan: Referral for Hospice in the home. Discussed Hospice agencies, [facility]. Telephone call to Sophia Nelson at [place] [facility] [phone]).  Sophia Nelson will need hospital bed. Will be going to her mother home.[place] [address]. [phone]   Expected Discharge Date:                  Expected Discharge Plan:     In-House Referral:   yes  Discharge planning Services  CM Consult  Post Acute Care Choice:   yes Choice offered to:   Sophia Nelson  DME Arranged:   yes DME Agency:   [facility] Arranged:    [facility] Agency:  [facility]   Status of Service:  In process, will continue to follow  If discussed at Long Length of Stay Meetings, dates discussed:    Additional Comments:  Sophia Ammons, RN MSN CCM Care Management [phone] 11/06/2017, 12:24 PM

## 2017-11-06 NOTE — Progress Notes (Signed)
[facility] [place] NAME: Sophia Nelson    MR#:  [id number]  DATE OF BIRTH:  June 12, 1946  SUBJECTIVE:   Still has nausea but no vomiting today.  Feels weak. Afebrile.  REVIEW OF SYSTEMS:    Review of Systems  Constitutional: Positive for malaise/fatigue. Negative for chills, fever and weight loss.  HENT: Negative for ear discharge, ear pain and nosebleeds.   Eyes: Negative for blurred vision, pain and discharge.  Respiratory: Negative for sputum production, shortness of breath, wheezing and stridor.   Cardiovascular: Negative for chest pain, palpitations, orthopnea and PND.  Gastrointestinal: Negative for abdominal pain, diarrhea, nausea and vomiting.  Genitourinary: Negative for frequency and urgency.  Musculoskeletal: Negative for back pain and joint pain.  Neurological: Positive for weakness. Negative for sensory change, speech change and focal weakness.  Psychiatric/Behavioral: Negative for depression and hallucinations. The patient is not nervous/anxious.     DRUG ALLERGIES:   Allergies  Allergen Reactions  . Other Other (See Comments)    Onions(that grow in the yard-pt can eat onions without problems) and dust mite Sneezing, cough, runny nose    VITALS:  Blood pressure 119/64, pulse 81, temperature 98.4 F (36.9 C), temperature source Oral, resp. rate 16, height 5\' 2"  (1.575 m), weight 50.8 kg (112 lb), SpO2 99 %.  PHYSICAL EXAMINATION:   Physical Exam  GENERAL:  72 y.o.-year-old patient lying in the bed EYES: Pupils equal, round, reactive to light and accommodation.  HEENT: Head atraumatic, normocephalic. Oropharynx and nasopharynx clear. 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 No thyroid enlargement, no tenderness.  LUNGS: Bibasilar crackles. Right UE port + site looks ok. No tenderness CARDIOVASCULAR: S1, S2.  T ABDOMEN: Soft, nontender, distended. Bowel sounds present. No organomegaly or  mass.  EXTREMITIES: No cyanosis, clubbing or edema b/l.    NEUROLOGIC: Cranial nerves II through XII are intact. No focal Motor or sensory deficits b/l.   PSYCHIATRIC:  patient is A and O x3  LABORATORY PANEL:   CBC Recent Labs  Lab 11/04/17 1300  WBC 8.2  HGB 10.3*  HCT 32.8*  PLT 501*   ------------------------------------------------------------------------------------------------------------------ Chemistries  Recent Labs  Lab 11/04/17 1300  NA 138  K 3.6  CL 97*  CO2 26  GLUCOSE 83  BUN 12  CREATININE 0.57  CALCIUM 9.0  AST 24  ALT 7*  ALKPHOS 132*  BILITOT 1.9*   ------------------------------------------------------------------------------------------------------------------  Cardiac Enzymes No results for input(s): TROPONINI in the last 168 hours. ------------------------------------------------------------------------------------------------------------------  RADIOLOGY:  Ct Abdomen Pelvis W Contrast  Result Date: 11/04/2017 CLINICAL DATA:  72 year old female with metastatic breast cancer with abdominal and small bowel involvement. Post Roux-en-Y for bowel obstruction. Now presenting with fever and form stomach. Abdominal pain with vomiting. Prior hysterectomy. Subsequent encounter. EXAM: CT ABDOMEN AND PELVIS WITH CONTRAST TECHNIQUE: Multidetector CT imaging of the abdomen and pelvis was performed using the standard protocol following bolus administration of intravenous contrast. CONTRAST:  75 cc Isovue 300. COMPARISON:  10/11/2017 CT. FINDINGS: Lower chest: No worrisome abnormality. Central line tip within the right atrium. Hepatobiliary: Intra and extrahepatic biliary duct dilation. No calcified common bile duct stone or obvious pancreatic mass seen as cause of obstruction. Correlation with liver enzymes recommended. If further delineation were clinically desired MRCP or ERCP could be performed. Slightly heterogeneous liver without worrisome mass. Pancreas: No  focal pancreatic mass. Spleen: No splenic mass or enlargement. Adrenals/Urinary Tract: Persistent mild right-sided hydronephrosis may be caused by  metastatic involvement of the right posterolateral pelvis narrowing the right ureter. Minimal fullness left renal collecting system. No mild prominence left adrenal gland without significant nodularity. Right adrenal gland unremarkable. Partially decompressed urinary bladder with multiple nodules suggests of metastatic involvement of the bladder wall. Stomach/Bowel: Post Roux-en-Y. 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 Metastatic involvement of the small bowel near the Roux-en-Y with proximal obstruction. Metastatic involvement of the ileum. Stomach, small bowel and colonic wall thickening and ascites without free intraperitoneal air. This may reflect underlying enterocolitis given patient's fever. Vascular/Lymphatic: Prominent atherosclerotic changes aorta and aortic branch vessels. No aortic aneurysm or large vessel occlusion. Prominent narrowing celiac artery origin and common iliac artery origin bilaterally. Scattered small to slightly enlarged lymph nodes most notable left periaortic region. Reproductive: Post hysterectomy. Other: 4 cm ill-defined area posterior right pelvis is previously noted suspicious for metastatic involvement. Third spacing of fluid. Peritoneal implants suspected. No free intraperitoneal air or bowel containing hernia. Musculoskeletal: Degenerative changes lumbar spine with Schmorl's node deformity superior endplate L3. No osseous destructive lesion. IMPRESSION: Post Roux-en-Y. 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 Metastatic involvement of the small bowel near the Roux-en-Y with proximal obstruction. Metastatic involvement of the ileum Stomach, small bowel and colonic wall thickening and  ascites may reflect underlying enterocolitis given patient's fever. Persistent mild right-sided hydronephrosis may be caused by metastatic disease of the right posterolateral pelvis (spanning over 4 cm) and subsequent narrowing the right ureter. Partially decompressed urinary bladder with multiple nodules suggests of metastatic involvement of the bladder wall. Third spacing of fluid with peritoneal implants suspected. Intra and extrahepatic biliary duct dilation. No calcified common bile duct stone or obvious pancreatic mass seen as cause of obstruction. Correlation with liver enzymes recommended. If further delineation were clinically desired MRCP or ERCP could be performed. Central line tip within the right atrium. Aortic Atherosclerosis (ICD10-I70.0). Electronically Signed   By: Genia Del M.D.   On: 11/04/2017 14:25     ASSESSMENT AND PLAN:   72 year old female with past medical history of breast cancer currently undergoing chemotherapy, hypertension, diabetes, COPD, previous history of pancreatitis, chronic back pain, previous history of gastric outlet obstruction who presents to the hospital due to fever, nausea and vomiting with abdominal pain  * Enterococcus bacteremia likely due to mentastasis to bowel iv Unasyn. Discussed with ID Dr. Ola Spurr. Likely changed to oral Augmentin tomorrow  Recent Streptococcus and E coli bacteremias.  * Acute toxic metabolic encephalopathy. Improving  * Metastatic breast cancer.  Follows with cancer center as outpatient. No plans for any further chemotherapy  * Pain management with metastasis Appreciate palliative care input Home with hospice tomorrow  * Anemia of chronic disease is stable  *Constipation Continue stool softeners.  Dulcolax suppository today.  *DVT prophylaxis with Lovenox  All the records are reviewed and case discussed with Care Management/Social Worker Management plans discussed with the patient and they are in  agreement.  CODE STATUS: FULL CODE  TOTAL TIME TAKING CARE OF THIS PATIENT: 30 minutes.   Neita Carp M.D on 11/06/2017 at 12:47 PM  Between 7am to 6pm - Pager - [phone]  After 6pm go to [email or web address] - password [email or web address] [facility]  Office  [phone]  CC: Primary care physician; Donnie Coffin, MD  Note: This dictation was prepared with Dragon dictation along with smaller phrase technology. Any transcriptional errors that  result from this process are unintentional.

## 2017-11-06 NOTE — Progress Notes (Signed)
Patient BP elevated and complaining of mid chest pain. Attending MD Darvin Neighbours) made aware of above. IV hydralazine given once with improvement. Per MD give PRN pain medication as ordered for pain. No additional orders at this time. Madlyn Frankel, RN

## 2017-11-06 NOTE — Progress Notes (Signed)
[place] INFECTIOUS DISEASE PROGRESS NOTE Date of Admission:  11/04/2017     ID: Sophia Nelson is a 72 y.o. female with enterococcal bacteremia Active Problems:   Nausea and vomiting   Metastatic cancer ([place])   Generalized abdominal pain   Palliative care encounter   Bacteremia   DNR (do not resuscitate)   Subjective: No fevers. Some nausea.  ROS  Eleven systems are reviewed and negative except per hpi  Medications:  Antibiotics Given (last 72 hours)    Date/Time Action Medication Dose Rate   11/04/17 1302 New Bag/Given   piperacillin-tazobactam (ZOSYN) IVPB 3.375 g 3.375 g 100 mL/hr   11/04/17 2332 New Bag/Given   cefTRIAXone (ROCEPHIN) 2 g in sodium chloride 0.9 % 100 mL IVPB 2 g 200 mL/hr   11/05/17 0800 New Bag/Given   ampicillin (OMNIPEN) 2 g in sodium chloride 0.9 % 100 mL IVPB 2 g 300 mL/hr   11/05/17 1156 New Bag/Given   ampicillin (OMNIPEN) 2 g in sodium chloride 0.9 % 100 mL IVPB 2 g 300 mL/hr   11/05/17 1800 New Bag/Given   Ampicillin-Sulbactam (UNASYN) 3 g in sodium chloride 0.9 % 100 mL IVPB 3 g 200 mL/hr   11/06/17 0015 New Bag/Given   Ampicillin-Sulbactam (UNASYN) 3 g in sodium chloride 0.9 % 100 mL IVPB 3 g 200 mL/hr   11/06/17 0506 New Bag/Given   Ampicillin-Sulbactam (UNASYN) 3 g in sodium chloride 0.9 % 100 mL IVPB 3 g 200 mL/hr   11/06/17 1440 New Bag/Given   Ampicillin-Sulbactam (UNASYN) 3 g in sodium chloride 0.9 % 100 mL IVPB 3 g 200 mL/hr     . aspirin EC  81 mg Oral Daily  . dexamethasone  1 mg Oral QID  . dicyclomine  40 mg Oral TID AC & HS  . enoxaparin (LOVENOX) injection  40 mg Subcutaneous Q24H  . feeding supplement (ENSURE ENLIVE)  237 mL Oral TID BM  . fluticasone  2 spray Each Nare Daily  . gabapentin  300 mg Oral BID  . Glycerin (Adult)  1 suppository Rectal Once  . loratadine  10 mg Oral Daily  . mouth rinse  15 mL Mouth Rinse BID  . metoCLOPramide  10 mg Oral QID  . mometasone-formoterol  2 puff Inhalation BID  .  montelukast  10 mg Oral QHS  . morphine  30 mg Oral Q8H  . pantoprazole  40 mg Oral Daily  . polyethylene glycol  17 g Oral Daily  . senna  2 tablet Oral QHS  . sodium chloride flush  10-40 mL Intracatheter Q12H  . tiotropium  18 mcg Inhalation Daily    Objective: Vital signs in last 24 hours: Temp:  [97.5 F (36.4 C)-98.4 F (36.9 C)] 97.9 F (36.6 C) (03/13 1458) Pulse Rate:  [81-90] 90 (03/13 1458) Resp:  [16] 16 (03/13 0445) BP: (119-175)/(64-90) 175/90 (03/13 1458) SpO2:  [99 %-100 %] 100 % (03/13 1458) Constitutional:  Slowed mentation, interactive. Chronically ill appearing HENT: [place]/AT, PERRLA, pale no scleral icterus Mouth/Throat: Oropharynx is clear and dry . No oropharyngeal exudate.  Cardiovascular: Normal rate, regular rhythm and normal heart sounds. Distant  Pulmonary/Chest: Effort normal and breath sounds normal. No respiratory distress.  has no wheezes.  Neck = supple, no nuchal rigidity Abdominal: distended, mod ttp, firm . Bowel sounds are normal.    Lymphadenopathy: no cervical adenopathy. No axillary adenopathy Neurological: alert and interactive but slowed mentation  Portacath site R chest wall wn, Skin: Skin is warm and  dry. No rash noted. No erythema.  Psychiatric: a normal mood and affect.  behavior is normal.    Lab Results Recent Labs    11/04/17 1300  WBC 8.2  HGB 10.3*  HCT 32.8*  NA 138  K 3.6  CL 97*  CO2 26  BUN 12  CREATININE 0.57    Microbiology: Results for orders placed or performed during the hospital encounter of 11/04/17  Culture, blood (routine x 2)     Status: None (Preliminary result)   Collection Time: 11/04/17  1:00 PM  Result Value Ref Range Status   Specimen Description BLOOD RAC  Final   Special Requests   Final    BOTTLES DRAWN AEROBIC AND ANAEROBIC Blood Culture adequate volume   Culture   Final    NO GROWTH 2 DAYS Performed at [facility], [address] [place], [place] [address]    Report Status  PENDING  Incomplete  Culture, blood (routine x 2)     Status: None (Preliminary result)   Collection Time: 11/04/17  1:01 PM  Result Value Ref Range Status   Specimen Description   Final    BLOOD RIGHT ARM Performed at [facility], [address], [place], [place] [address]    Special Requests   Final    BOTTLES DRAWN AEROBIC AND ANAEROBIC Blood Culture adequate volume Performed at [facility], [address], [place], [place] [address]    Culture  Setup Time   Final    GRAM POSITIVE COCCI AEROBIC BOTTLE ONLY CRITICAL RESULT CALLED TO, READ BACK BY AND VERIFIED WITH: [place] AT 0102 11/05/17 SDR Performed at [facility], [place] [address], [place],  [address]    Culture GRAM POSITIVE COCCI  Final   Report Status PENDING  Incomplete  Blood Culture ID Panel (Reflexed)     Status: Abnormal   Collection Time: 11/04/17  1:01 PM  Result Value Ref Range Status   Enterococcus species DETECTED (A) NOT DETECTED Final    Comment: CRITICAL RESULT CALLED TO, READ BACK BY AND VERIFIED WITH:  Gina Leblond BESANTI AT 0723 11/05/17 SDR    Vancomycin resistance NOT DETECTED NOT DETECTED Final   Listeria monocytogenes NOT DETECTED NOT DETECTED Final   Staphylococcus species NOT DETECTED NOT DETECTED Final   Staphylococcus aureus NOT DETECTED NOT DETECTED Final   Streptococcus species NOT DETECTED NOT DETECTED Final   Streptococcus agalactiae NOT DETECTED NOT DETECTED Final   Streptococcus pneumoniae NOT DETECTED NOT DETECTED Final   Streptococcus pyogenes NOT DETECTED NOT DETECTED Final   Acinetobacter baumannii NOT DETECTED NOT DETECTED Final   Enterobacteriaceae species NOT DETECTED NOT DETECTED Final   Enterobacter cloacae complex NOT DETECTED NOT DETECTED Final   Escherichia coli NOT DETECTED NOT DETECTED Final   Klebsiella oxytoca NOT DETECTED NOT DETECTED Final   Klebsiella pneumoniae NOT DETECTED NOT DETECTED Final   Proteus species NOT DETECTED NOT  DETECTED Final   Serratia marcescens NOT DETECTED NOT DETECTED Final   Haemophilus influenzae NOT DETECTED NOT DETECTED Final   Neisseria meningitidis NOT DETECTED NOT DETECTED Final   Pseudomonas aeruginosa NOT DETECTED NOT DETECTED Final   Candida albicans NOT DETECTED NOT DETECTED Final   Candida glabrata NOT DETECTED NOT DETECTED Final   Candida krusei NOT DETECTED NOT DETECTED Final   Candida parapsilosis NOT DETECTED NOT DETECTED Final   Candida tropicalis NOT DETECTED NOT DETECTED Final    Comment: Performed at [facility], [place], [place]  [address]    Studies/Results: No results found.  Assessment/Plan: Sophia Nelson is a 72 y.o. female with hx of metastatic breast cancer undergoing chemo with recent strep bacteremia, on IV ceftriaxone and flagyl as otpt admitted with NV abd pain. She on admission had temp 99 , wbc 8 .   [place] previous + for Streptococcus gallolyticus.  Now + enterococcus.   She has a portacath in place but no pain at the site.She has CT scan showing continue R pelvis mass suspicious for mets,  No abscess.  She has advancing cancer and is considering hospice.She will remain at persistent risk of recurrent bacteremia given the bowel involvement.   Would cont IV abx until stable then dc on oral abx for several weeks.  Recommendations Cont unasyn At dc can send on augmentin for a 28 day course  Thank you very much for the consult. Will follow with you.  Leonel Ramsay   11/06/2017, 3:06 PM

## 2017-11-06 NOTE — Progress Notes (Addendum)
Daily Progress Note   Patient Name: Sophia Nelson       Date: 11/06/2017 DOB: 12-01-1945  Age: 72 y.o. MRN#: [id number] Attending Physician: Hillary Bow, MD Primary Care Physician: Donnie Coffin, MD Admit Date: 11/04/2017  Reason for Consultation/Follow-up: Establishing goals of care, Pain control and Psychosocial/spiritual support  Subjective: Patient sitting up appears cheerful.  States she slept well and pain is improved.  She asks when her very hard stomach is going to go down and get softer.    We discussed talking with her family about her illness and the idea of going home with hospice. It hit them hard yesterday.  She is considering which of her children to go stay with.    We discussed using a suppository in addition to her current medications to attempt to induce a bowel movement.   Assessment:   Feeling better on increased pain medications.  Abdomen hard and tight.     Patient Profile/HPI:  72 y.o. female  with past medical history of breast cancer s/p bilateral mastectomy.  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  She has had recurrent bacteremia x 3 (3 different bacteria - ecoli, strep, and enterococcus), gastric outlet obstruction, DM, AKI, and COPD.  She was admitted on 3/11/2019with nausea and vomiting and was found to have bacteremia.  Due to her physical decline she is not a candidate for further chemotherapy, radiation, or surgery.    Length of Stay: 2  Current Medications: Scheduled Meds:  . aspirin EC  81 mg Oral Daily  . atorvastatin  10 mg Oral QPM  . dexamethasone  1 mg Oral QID  . dicyclomine  40 mg Oral TID AC & HS  . docusate sodium  100 mg Oral BID  . enoxaparin (LOVENOX) injection  40 mg Subcutaneous Q24H  .  everolimus  5 mg Oral Daily  . exemestane  25 mg Oral QPC breakfast  . feeding supplement (ENSURE ENLIVE)  237 mL Oral TID BM  . fluticasone  2 spray Each Nare Daily  . gabapentin  300 mg Oral BID  . Glycerin (Adult)  1 suppository Rectal Once  . loratadine  10 mg Oral Daily  . mouth rinse  15 mL Mouth Rinse BID  . metoCLOPramide  10 mg Oral QID  .  mometasone-formoterol  2 puff Inhalation BID  . montelukast  10 mg Oral QHS  . morphine  30 mg Oral Q8H  . pantoprazole  40 mg Oral Daily  . sodium chloride flush  10-40 mL Intracatheter Q12H  . thiamine  100 mg Oral Daily  . tiotropium  18 mcg Inhalation Daily  . cyanocobalamin  500 mcg Oral Daily    Continuous Infusions: . sodium chloride 50 mL/hr at 11/06/17 0705  . ampicillin-sulbactam (UNASYN) IV Stopped (11/06/17 0536)    PRN Meds: albuterol, HYDROmorphone (DILAUDID) injection, iopamidol, lidocaine-prilocaine, morphine CONCENTRATE, ondansetron (ZOFRAN) IV **OR** ondansetron, oxyCODONE-acetaminophen, prochlorperazine, senna-docusate, sodium chloride flush  Physical Exam        Awake, alert, pleasant, coherent, NAD CV rrr resp no distress Abdomen distended, hard tender to palpation.  Vital Signs: BP 119/64 (BP Location: Right Arm)   Pulse 81   Temp 98.4 F (36.9 C) (Oral)   Resp 16   Ht 5\' 2"  (1.575 m)   Wt 50.8 kg (112 lb)   SpO2 99%   BMI 20.49 kg/m  SpO2: SpO2: 99 % O2 Device: O2 Device: Nasal Cannula O2 Flow Rate: O2 Flow Rate (L/min): 2 L/min  Intake/output summary:   Intake/Output Summary (Last 24 hours) at 11/06/2017 1043 Last data filed at 11/06/2017 0900 Gross per 24 hour  Intake 2333.66 ml  Output -  Net 2333.66 ml   LBM: Last BM Date: 11/04/17 Baseline Weight: Weight: 50.8 kg (112 lb) Most recent weight: Weight: 50.8 kg (112 lb)       Palliative Assessment/Data:      Patient Active Problem List   Diagnosis Date Noted  . Metastatic cancer ([place])   . Generalized abdominal pain   . Palliative  care encounter   . Bacteremia   . Nausea and vomiting 11/04/2017  . Protein-calorie malnutrition, severe 10/13/2017  . Sepsis ([place]) 10/11/2017  . Acute respiratory distress   . Symptomatic anemia   . Acute on chronic respiratory failure with hypoxia ([place]) 09/21/2017  . Cholecystitis 08/07/2017  . Syncope   . Metastatic breast cancer ([place])   . Hypotension   . Palliative care by specialist   . Goals of care, counseling/discussion   . Severe sepsis with septic shock ([place]) 06/09/2017  . Hypocalcemia 05/30/2017  . Metastasis to bone ([place]) 04/30/2017  . Acute back pain 03/28/2017  . Back pain, chronic 03/15/2017  . Chronic lumbar pain 03/15/2017  . Recurrent low back pain 03/15/2017  . Duodenal obstruction   . Pancreatitis, acute 03/04/2017  . Abdominal distention   . Encounter for nasogastric (NG) tube placement   . Gastric outlet obstruction 02/24/2017  . AKI (acute kidney injury) ([place]) 02/24/2017  . GERD (gastroesophageal reflux disease) 02/24/2017  . Diabetes ([place]) 02/24/2017  . COPD (chronic obstructive pulmonary disease) ([place]) 02/24/2017  . Neck pain 04/19/2016  . Carcinoma of overlapping sites of left breast in female, estrogen receptor positive ([place]) 03/22/2016    Palliative Care Plan    Recommendations/Plan:  Ambulate as tolerated  Will d/c colace and add senna, miralax and a suppository this evening.  If her bowels do not work well over night - I recommend 1/2 a miralax bowel prep for tomorrow - she can sip slowly on this and clean out.  Will discontinue B12, thiamine, atorvastatin.    I have spoken with Dr. Rogue Bussing and  will discontinue the Affinitor and Aromasin.  Anticipate she will go home with hospice in the next day or so.  I will place an  order with case management.  Goals of Care and Additional Recommendations:  Limitations on Scope of Treatment: Minimize Medications  Code Status:  DNR  Prognosis:  Weeks - metastatic breast cancer with recurrent  bacteremia  Discharge Planning:  Home with Hospice   Care plan was discussed with patient.  Thank you for allowing the Palliative Medicine Team to assist in the care of this patient.  Total time spent:  35 min.     Greater than 50%  of this time was spent counseling and coordinating care related to the above assessment and plan.  Florentina Jenny, PA-C Palliative Medicine  Please contact Palliative MedicineTeam phone at [phone] for questions and concerns between 7 am - 7 pm.   Please see AMION for individual provider pager numbers.

## 2017-11-07 LAB — CULTURE, BLOOD (ROUTINE X 2): SPECIAL REQUESTS: ADEQUATE

## 2017-11-07 MED ORDER — AMOXICILLIN-POT CLAVULANATE 875-125 MG PO TABS: 1.0000 | ORAL_TABLET | Freq: Two times a day (BID) | ORAL | Status: DC

## 2017-11-07 MED ORDER — POLYETHYLENE GLYCOL 3350 17 G PO PACK: 17.0000 g | PACK | Freq: Every day | ORAL | 0 refills | Status: AC | PRN

## 2017-11-07 MED ORDER — LINEZOLID 600 MG PO TABS: 600.0000 mg | ORAL_TABLET | Freq: Two times a day (BID) | ORAL | 0 refills | Status: DC

## 2017-11-07 MED ORDER — AMOXICILLIN-POT CLAVULANATE 875-125 MG PO TABS: 1.0000 | ORAL_TABLET | Freq: Two times a day (BID) | ORAL | 0 refills | Status: DC

## 2017-11-07 MED ORDER — MORPHINE SULFATE (CONCENTRATE) 10 MG/0.5ML PO SOLN: 10.0000 mg | ORAL | 0 refills | Status: AC | PRN

## 2017-11-07 MED ORDER — MORPHINE SULFATE ER 30 MG PO TBCR: 30.0000 mg | EXTENDED_RELEASE_TABLET | Freq: Three times a day (TID) | ORAL | 0 refills | Status: AC

## 2017-11-07 MED ORDER — LINEZOLID 600 MG PO TABS: 600.0000 mg | ORAL_TABLET | Freq: Two times a day (BID) | ORAL | 0 refills | Status: AC

## 2017-11-07 MED ORDER — ONDANSETRON 8 MG PO TBDP: 8.0000 mg | ORAL_TABLET | Freq: Three times a day (TID) | ORAL | 1 refills | Status: AC | PRN

## 2017-11-07 MED ORDER — LINEZOLID 600 MG/300ML IV SOLN
600.0000 mg | Freq: Two times a day (BID) | INTRAVENOUS | Status: DC
  Administered 2017-11-07: 11:00:00 600 mg via INTRAVENOUS
  Filled 2017-11-07 (×2): qty 300

## 2017-11-07 MED ORDER — LINEZOLID 600 MG PO TABS
600.0000 mg | ORAL_TABLET | Freq: Two times a day (BID) | ORAL | Status: DC
  Filled 2017-11-07: qty 1

## 2017-11-07 MED ORDER — ONDANSETRON 8 MG PO TBDP: 8.0000 mg | ORAL_TABLET | Freq: Three times a day (TID) | ORAL | 0 refills | Status: AC | PRN

## 2017-11-07 MED ORDER — AMOXICILLIN-POT CLAVULANATE 875-125 MG PO TABS: 1.0000 | ORAL_TABLET | Freq: Two times a day (BID) | ORAL | 0 refills | Status: AC

## 2017-11-07 MED ORDER — HEPARIN SOD (PORK) LOCK FLUSH 100 UNIT/ML IV SOLN
500.0000 [IU] | Freq: Once | INTRAVENOUS | Status: AC
  Administered 2017-11-07: 500 [IU] via INTRAVENOUS
  Filled 2017-11-07: qty 5

## 2017-11-07 MED ORDER — DICYCLOMINE HCL 10 MG/5ML PO SOLN: 40.0000 mg | Freq: Three times a day (TID) | ORAL | 12 refills | Status: AC

## 2017-11-07 MED ORDER — POTASSIUM CHLORIDE CRYS ER 20 MEQ PO TBCR: 20.0000 meq | EXTENDED_RELEASE_TABLET | Freq: Every day | ORAL | Status: AC

## 2017-11-07 MED ORDER — OXYCODONE-ACETAMINOPHEN 5-325 MG PO TABS: 1.0000 | ORAL_TABLET | Freq: Three times a day (TID) | ORAL | 0 refills | Status: AC | PRN

## 2017-11-07 NOTE — Progress Notes (Signed)
MIKA GRIFFITTS   DOB:July 29, 1946   ZH#:[id number]    Subjective: Patient states he had no fevers.  Abdominal pain is controlled.  No nausea no vomiting.  Continues to feel weak.  Positive for constipation or diarrhea.  Objective:  Vitals:   11/07/17 0605 11/07/17 0837  BP: 118/68 137/62  Pulse: 93 88  Resp: 18 16  Temp: 98.3 F (36.8 C) 98.4 F (36.9 C)  SpO2: 98% 98%    No intake or output data in the 24 hours ending 11/09/17 1303  GENERAL: Frail cachectic appearing female patient resting in the bed.  Alert, no distress and comfortable.  Alone. EYES: no pallor or icterus OROPHARYNX: no thrush or ulceration. NECK: supple, no masses felt LYMPH:  no palpable lymphadenopathy in the cervical, axillary or inguinal regions LUNGS: decreased breath sounds to auscultation at bases and  No wheeze or crackles HEART/CVS: regular rate & rhythm and no murmurs; No lower extremity edema ABDOMEN: abdomen soft, distended mildly tender and normal bowel sounds Musculoskeletal:no cyanosis of digits and no clubbing  PSYCH: alert & oriented x 3 with fluent speech NEURO: no focal motor/sensory deficits SKIN:  no rashes or significant lesions     Labs:  Lab Results  Component Value Date   WBC 8.2 11/04/2017   HGB 10.3 (L) 11/04/2017   HCT 32.8 (L) 11/04/2017   MCV 84.9 11/04/2017   PLT 501 (H) 11/04/2017   NEUTROABS 6.0 11/04/2017    Lab Results  Component Value Date   NA 138 11/04/2017   K 3.6 11/04/2017   CL 97 (L) 11/04/2017   CO2 26 11/04/2017    Studies:  No results found.  Assessment & Plan:   # 72 year old female patient with a history of metastatic breast cancer currently admitted to the hospital for abdominal pain/nausea vomiting  # Metastatic breast cancer/lobular ER PR positive-metastatic to predominant GI/small bowel/pelvic metastases/omental metastases-status post multiple lines of therapy-poor candidate for any further therapy.  Patient agreeable to going home with  hospice.  Will discontinue Aromasin/Afinitor.  #Pain second malignancy-continue narcotic pain medication.  #Bacteremia-secondary to bowel involvement by the tumor; on IV antibiotics;  plan to switch over to oral Augmentin.  Reviewed ID recommendations.  #CODE STATUS: Patient agrees with no code.   #Follow-up in the office as needed; I will continue to be patient's attending hospitalist physician.   [name], MD

## 2017-11-07 NOTE — Care Management (Signed)
Discharge to home today per Dr. Margaretmary Eddy. Son will transport. Will be followed by [facility] in the home. Lenox Ponds LPN, [facility] representative updated. Shelbie Ammons RN MSN CCM Care Management [phone]

## 2017-11-07 NOTE — Discharge Instructions (Signed)
Follow-up with primary care physician in 5 to 7 days Follow-up with Dr. Rogue Bussing in a week Follow-up with infectious disease in 2 to 3 weeks Continue 2 L of oxygen via nasal cannula Home with hospice care

## 2017-11-07 NOTE — Progress Notes (Signed)
Sophia Nelson   DOB:11/16/1945   PR#:[id number]    Subjective: No fevers.  Continues to feel poorly.  Poor appetite.  Positive for constipation.  Positive for nausea no vomiting.  Objective:  Vitals:   11/06/17 2001 11/07/17 0605  BP: 133/71 118/68  Pulse: 98 93  Resp: 18 18  Temp: 98.3 F (36.8 C) 98.3 F (36.8 C)  SpO2: 92% 98%     Intake/Output Summary (Last 24 hours) at 11/07/2017 9935 Last data filed at 11/06/2017 2300 Gross per 24 hour  Intake 1595.83 ml  Output -  Net 1595.83 ml    GENERAL: Frail cachectic appearing female patient resting in the bed.  Alert, no distress and comfortable.  Alone. EYES: no pallor or icterus OROPHARYNX: no thrush or ulceration. NECK: supple, no masses felt LYMPH:  no palpable lymphadenopathy in the cervical, axillary or inguinal regions LUNGS: decreased breath sounds to auscultation at bases and  No wheeze or crackles HEART/CVS: regular rate & rhythm and no murmurs; No lower extremity edema ABDOMEN: abdomen soft, distended mildly tender and normal bowel sounds Musculoskeletal:no cyanosis of digits and no clubbing  PSYCH: alert & oriented x 3 with fluent speech NEURO: no focal motor/sensory deficits SKIN:  no rashes or significant lesions     Labs:  Lab Results  Component Value Date   WBC 8.2 11/04/2017   HGB 10.3 (L) 11/04/2017   HCT 32.8 (L) 11/04/2017   MCV 84.9 11/04/2017   PLT 501 (H) 11/04/2017   NEUTROABS 6.0 11/04/2017    Lab Results  Component Value Date   NA 138 11/04/2017   K 3.6 11/04/2017   CL 97 (L) 11/04/2017   CO2 26 11/04/2017    Studies:  No results found.  Assessment & Plan:   # 72 year old female patient with a history of metastatic breast cancer currently admitted to the hospital for abdominal pain/nausea vomiting  # Metastatic breast cancer/lobular ER PR positive-metastatic to predominant GI/small bowel/pelvic metastases/omental metastases-status post multiple lines of therapy-poor candidate for  any further therapy.  Appreciate palliative care/hospice evaluation. Spoke to palliative care nurse Mary-Aromasin can be discontinued.  No plans for starting Afinitor  #Pain second malignancy-continue narcotic pain medication.  #Bacteremia-secondary to bowel involvement by the tumor; on IV antibiotics.  Discussed with the patient.  #CODE STATUS: Patient agrees with no code.   #Patient understands life expectancy is limited; and she also tells me that the rest of the family including her 2 sons are very much aware of her poor prognosis.  Appreciate palliative care evaluation.  [name], MD

## 2017-11-07 NOTE — Plan of Care (Signed)
  Progressing Nutrition: Adequate nutrition will be maintained 11/07/2017 1024 - Progressing by Alen Blew, RN Pain Managment: General experience of comfort will improve 11/07/2017 1024 - Progressing by Alen Blew, RN

## 2017-11-07 NOTE — Progress Notes (Signed)
Pt discharged this afternoon home with son hospital bed has arrived at home, and pt portable oxygen tank present. Pt Right sided chest port deaccessed, pt prescriptions, and follow up appointment information provided.  VSS, no complaints or complications at this time.

## 2017-11-07 NOTE — Care Management Important Message (Signed)
Important Message  Patient Details  Name: Sophia Nelson MRN: [id number] Date of Birth: September 02, 1945   Medicare Important Message Given:  Yes    Shelbie Ammons, RN 11/07/2017, 10:11 AM

## 2017-11-07 NOTE — Discharge Summary (Signed)
[facility] NAME: Sophia Nelson    MR#:  [id number]  DATE OF BIRTH:  [birth year]  DATE OF ADMISSION:  11/04/2017 ADMITTING PHYSICIAN: Dustin Flock, MD  DATE OF DISCHARGE: 11/07/17  PRIMARY CARE PHYSICIAN: Donnie Coffin, MD    ADMISSION DIAGNOSIS:  Gastric outlet obstruction [K31.1] Generalized abdominal pain [R10.84] Intractable vomiting with nausea, unspecified vomiting type [R11.2]  DISCHARGE DIAGNOSIS:  Active Problems:   Nausea and vomiting   Metastatic cancer ([place])   Generalized abdominal pain   Palliative care encounter   Bacteremia   DNR (do not resuscitate)   SECONDARY DIAGNOSIS:   Past Medical History:  Diagnosis Date  . Abdominal distention   . AKI (acute kidney injury) ([place]) 02/24/2017  . Anemia   . Arthritis   . Asthma   . Back pain, chronic 03/15/2017  . Breast cancer ([place]) 2009   RT MASTECTOMY, DCIS  . 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 Cancer of left breast ([place]) 08/09/2015   T4 N1 M1 tumor, INVASIVE LOBULAR CARCINOMA.  . Carcinoma of overlapping sites of left breast in female, estrogen receptor positive ([place]) 03/22/2016  . COPD (chronic obstructive pulmonary disease) ([place])   . Diabetes ([place]) 02/24/2017  . Diabetes mellitus without complication ([place])   . Duodenal obstruction   . Encounter for nasogastric (NG) tube placement   . Gastric outlet obstruction 02/24/2017  . GERD (gastroesophageal reflux disease)   . Headache    h/o migraines as a child  . Hypertension   . Neck pain 04/19/2016  . Pancreatitis, acute 03/04/2017  . Pneumonia 2015  . Recurrent low back pain 03/15/2017  . Shortness of breath dyspnea    with exertion    HOSPITAL COURSE:  HISTORY OF PRESENT ILLNESS: Sophia Nelson  is a 72 y.o. female with a known history of metastatic breast cancer to her GI tract who is presenting with  nausea vomiting.  Patient has had multiple admissions recent for sepsis and is on IV antibiotics at home.  Patient states that she started having nausea vomiting and worsening abdominal pain for the past 2 days.  Symptoms have progressively gotten worse.  Patient was brought to the emergency room with the symptoms and had a CT scan and was seen by oncology who recommended that she has not responded to chemo and recommended admission for palliative care evaluation and possible hospice at home or hospice facility transfer.    * Enterococcus bacteremia likely due to mentastasis to bowel iv Unasyn was initially given but it was changed to IV Zyvox as it is resistant IV Unasyn.  Okay to discharge patient with p.o. Zyvox for 1 week followed by Augmentin for 21 days total as discussed with ID Dr. Ola Spurr. Recent Streptococcus and E coli bacteremias.  * Acute toxic metabolic encephalopathy. Improving  * Metastatic breast cancer.  Follows with cancer center as outpatient. No plans for any further chemotherapy  * Pain management with metastasis Appreciate palliative care input Home with hospice today  * Anemia of chronic disease is stable  *Constipation Continue stool softeners.  Dulcolax suppository today.  Chronically lives  on 2 L of oxygen via nasal cannula.  Discharged with hospice care at home  DISCHARGE CONDITIONS:  Stable   CONSULTS OBTAINED:  Treatment Team:  Leonel Ramsay, MD   PROCEDURES  None   DRUG ALLERGIES:   Allergies  Allergen Reactions  . Other Other (See Comments)    Onions(that grow in the yard-pt can eat onions without problems) and dust mite Sneezing, cough, runny nose    DISCHARGE MEDICATIONS:   Allergies as of 11/07/2017      Reactions   Other Other (See Comments)   Onions(that grow in the yard-pt can eat onions without problems) and dust mite Sneezing, cough, runny nose      Medication List    STOP taking these medications    cefTRIAXone 2 g in sodium chloride 0.9 % 100 mL   cefTRIAXone IVPB Commonly known as:  ROCEPHIN   loperamide 2 MG capsule Commonly known as:  IMODIUM     TAKE these medications   ADVAIR DISKUS 500-50 MCG/DOSE Aepb Generic drug:  Fluticasone-Salmeterol Inhale 1 puff into the lungs 2 (two) times daily.   albuterol 108 (90 Base) MCG/ACT inhaler Commonly known as:  PROVENTIL HFA;VENTOLIN HFA Inhale 2 puffs into the lungs every 6 (six) hours as needed for wheezing or shortness of breath.   amoxicillin-clavulanate 875-125 MG tablet Commonly known as:  AUGMENTIN Take 1 tablet by mouth every 12 (twelve) hours for 21 days. Start taking on:  11/15/2017   amoxicillin-clavulanate 875-125 MG tablet Commonly known as:  AUGMENTIN Take 1 tablet by mouth every 12 (twelve) hours for 21 days. Start taking on:  11/15/2017   aspirin EC 81 MG tablet Take 81 mg by mouth daily.   atorvastatin 10 MG tablet Commonly known as:  LIPITOR Take 10 mg by mouth every evening.   cyanocobalamin 500 MCG tablet Take 1 tablet (500 mcg total) by mouth daily.   dexamethasone 0.5 MG/5ML solution Commonly known as:  DECADRON Take 10 mLs (1 mg total) by mouth 4 (four) times daily. Swish solution for 2 minutes then spit.   dicyclomine 10 MG/5ML syrup Commonly known as:  BENTYL Take 20 mLs (40 mg total) by mouth 4 (four) times daily -  before meals and at bedtime.   docusate sodium 100 MG capsule Commonly known as:  COLACE Take 100 mg by mouth 2 (two) times daily.   everolimus 5 MG tablet Commonly known as:  AFINITOR Take 1 tablet (5 mg total) by mouth daily.   exemestane 25 MG tablet Commonly known as:  AROMASIN Take 1 tablet (25 mg total) by mouth daily after breakfast.   feeding supplement (ENSURE ENLIVE) Liqd Take 237 mLs by mouth 3 (three) times daily between meals.   fluticasone 50 MCG/ACT nasal spray Commonly known as:  FLONASE Place 2 sprays into both nostrils daily.   gabapentin 300 MG  capsule Commonly known as:  NEURONTIN Take 300 mg by mouth 2 (two) times daily.   lidocaine-prilocaine cream Commonly known as:  EMLA Apply 1 application topically as needed (for port access).   linezolid 600 MG tablet Commonly known as:  ZYVOX Take 1 tablet (600 mg total) by mouth 2 (two) times daily for 7 days.   linezolid 600 MG tablet Commonly known as:  ZYVOX Take 1 tablet (600 mg total) by mouth every 12 (twelve) hours for 7 days.   loratadine 10 MG tablet Commonly known as:  CLARITIN Take 10 mg by mouth daily.   Magnesium Cl-Calcium Carbonate 70-117 MG Tbec Commonly known as:  SLOW MAGNESIUM/CALCIUM Take 1 tablet by mouth 2 (two) times daily.   metoCLOPramide 10 MG tablet Commonly known as:  REGLAN Take 1 tablet (10 mg total) by mouth 4 (four) times daily.   montelukast 10 MG tablet  Commonly known as:  SINGULAIR Take 10 mg by mouth at bedtime.   morphine 30 MG 12 hr tablet Commonly known as:  MS CONTIN Take 1 tablet (30 mg total) by mouth every 8 (eight) hours. What changed:  when to take this   morphine CONCENTRATE 10 MG/0.5ML Soln concentrated solution Place 0.5 mLs (10 mg total) under the tongue every 2 (two) hours as needed (break thru pain). What changed:  You were already taking a medication with the same name, and this prescription was added. Make sure you understand how and when to take each.   omeprazole 20 MG capsule Commonly known as:  PRILOSEC Take 20 mg by mouth every morning.   ondansetron 8 MG disintegrating tablet Commonly known as:  ZOFRAN-ODT Take 1 tablet (8 mg total) by mouth every 8 (eight) hours as needed for nausea or vomiting. What changed:  You were already taking a medication with the same name, and this prescription was added. Make sure you understand how and when to take each.   ondansetron 8 MG disintegrating tablet Commonly known as:  ZOFRAN ODT Take 1 tablet (8 mg total) by mouth every 8 (eight) hours as needed for nausea or  vomiting. What changed:  Another medication with the same name was added. Make sure you understand how and when to take each.   oxyCODONE-acetaminophen 5-325 MG tablet Commonly known as:  PERCOCET/ROXICET Take 1 tablet by mouth every 8 (eight) hours as needed for moderate pain. What changed:  reasons to take this   polyethylene glycol packet Commonly known as:  MIRALAX / GLYCOLAX Take 17 g by mouth daily as needed.   potassium chloride SA 20 MEQ tablet Commonly known as:  K-DUR,KLOR-CON Take 1 tablet (20 mEq total) by mouth daily.   prochlorperazine 10 MG tablet Commonly known as:  COMPAZINE Take 1 tablet (10 mg total) by mouth every 6 (six) hours as needed for nausea or vomiting.   senna-docusate 8.6-50 MG tablet Commonly known as:  Senokot-S Take 1 tablet by mouth at bedtime as needed for mild constipation.   thiamine 100 MG tablet Take 1 tablet (100 mg total) by mouth daily.   tiotropium 18 MCG inhalation capsule Commonly known as:  SPIRIVA Place 1 capsule (18 mcg total) into inhaler and inhale daily.        DISCHARGE INSTRUCTIONS:  Follow-up with primary care physician in 5 to 7 days Follow-up with Dr. Rogue Bussing in a week Follow-up with infectious disease in 2 to 3 weeks Continue 2 L of oxygen via nasal cannula Home with hospice care   DIET:  Regular diet  DISCHARGE CONDITION:  Stable  ACTIVITY:  Activity as tolerated  OXYGEN:  Home Oxygen: Yes.     Oxygen Delivery: 2 liters/min via Patient connected to nasal cannula oxygen  DISCHARGE LOCATION:  home   If you experience worsening of your admission symptoms, develop shortness of breath, life threatening emergency, suicidal or homicidal thoughts you must seek medical attention immediately by calling 911 or calling your MD immediately  if symptoms less severe.  You Must read complete instructions/literature along with all the possible adverse reactions/side effects for all the Medicines you take and  that have been prescribed to you. Take any new Medicines after you have completely understood and accpet all the possible adverse reactions/side effects.   Please note  You were cared for by a hospitalist during your hospital stay. If you have any questions about your discharge medications or the care you received while  you were in the hospital after you are discharged, you can call the unit and asked to speak with the hospitalist on call if the hospitalist that took care of you is not available. Once you are discharged, your primary care physician will handle any further medical issues. Please note that NO REFILLS for any discharge medications will be authorized once you are discharged, as it is imperative that you return to your primary care physician (or establish a relationship with a primary care physician if you do not have one) for your aftercare needs so that they can reassess your need for medications and monitor your lab values.     Today  Chief Complaint  Patient presents with  . Abdominal Pain  . Emesis  Patient is feeling much better.  Wants to be discharged.  Tolerating diet.  Reports she has chronic chest pain and back pain for several years, no new complaints   ROS:  CONSTITUTIONAL: Denies fevers, chills. Denies any fatigue, weakness.  EYES: Denies blurry vision, double vision, eye pain. EARS, NOSE, THROAT: Denies tinnitus, ear pain, hearing loss. RESPIRATORY: Denies cough, wheeze, shortness of breath.  CARDIOVASCULAR: Denies chest pain, palpitations, edema.  GASTROINTESTINAL: Denies nausea, vomiting, diarrhea, abdominal pain. Denies bright red blood per rectum. GENITOURINARY: Denies dysuria, hematuria. ENDOCRINE: Denies nocturia or thyroid problems. HEMATOLOGIC AND LYMPHATIC: Denies easy bruising or bleeding. SKIN: Denies rash or lesion. MUSCULOSKELETAL: Denies pain in neck, back, shoulder, knees, hips or arthritic symptoms.  NEUROLOGIC: Denies paralysis, paresthesias.   PSYCHIATRIC: Denies anxiety or depressive symptoms.   VITAL SIGNS:  Blood pressure 137/62, pulse 88, temperature 98.4 F (36.9 C), temperature source Oral, resp. rate 16, height 5\' 2"  (1.575 m), weight 50.8 kg (112 lb), SpO2 98 %.  I/O:    Intake/Output Summary (Last 24 hours) at 11/07/2017 1245 Last data filed at 11/07/2017 1020 Gross per 24 hour  Intake 1535.83 ml  Output -  Net 1535.83 ml    PHYSICAL EXAMINATION:  GENERAL:  72 y.o.-year-old patient lying in the bed with no acute distress.  EYES: Pupils equal, round, reactive to light and accommodation. No scleral icterus. Extraocular muscles intact.  HEENT: Head atraumatic, normocephalic. Oropharynx and nasopharynx clear.  NECK:  Supple, no jugular venous distention. No thyroid enlargement, no tenderness.  LUNGS: Normal breath sounds bilaterally, no wheezing, rales,rhonchi or crepitation. No use of accessory muscles of respiration.  CARDIOVASCULAR: S1, S2 normal. No murmurs, rubs, or gallops.  ABDOMEN: Soft, non-tender, non-distended. Bowel sounds present.  EXTREMITIES: No pedal edema, cyanosis, or clubbing.  NEUROLOGIC: Cranial nerves II through XII are intact. Muscle strength 5/5 in all extremities. Sensation intact. Gait not checked.  PSYCHIATRIC: The patient is alert and oriented x 3.  SKIN: No obvious rash, lesion, or ulcer.   DATA REVIEW:   CBC Recent Labs  Lab 11/04/17 1300  WBC 8.2  HGB 10.3*  HCT 32.8*  PLT 501*    Chemistries  Recent Labs  Lab 11/04/17 1300  NA 138  K 3.6  CL 97*  CO2 26  GLUCOSE 83  BUN 12  CREATININE 0.57  CALCIUM 9.0  AST 24  ALT 7*  ALKPHOS 132*  BILITOT 1.9*    Cardiac Enzymes No results for input(s): TROPONINI in the last 168 hours.  Microbiology Results  Results for orders placed or performed during the hospital encounter of 11/04/17  Culture, blood (routine x 2)     Status: None (Preliminary result)   Collection Time: 11/04/17  1:00 PM  Result Value Ref  Range  Status   Specimen Description BLOOD RAC  Final   Special Requests   Final    BOTTLES DRAWN AEROBIC AND ANAEROBIC Blood Culture adequate volume   Culture   Final    NO GROWTH 3 DAYS Performed at [facility], [address] [place], [place] [address]    Report Status PENDING  Incomplete  Culture, blood (routine x 2)     Status: Abnormal   Collection Time: 11/04/17  1:01 PM  Result Value Ref Range Status   Specimen Description   Final    BLOOD RIGHT ARM Performed at [facility], [address], [place], [place] [address]    Special Requests   Final    BOTTLES DRAWN AEROBIC AND ANAEROBIC Blood Culture adequate volume Performed at [facility], [address], [place], [place] [address]    Culture  Setup Time   Final    GRAM POSITIVE COCCI AEROBIC BOTTLE ONLY CRITICAL RESULT CALLED TO, READ BACK BY AND VERIFIED WITH: Williamson AT 5277 11/05/17 SDR Performed at [facility], [place] [address], [place], [place] [address]    Culture ENTEROCOCCUS HIRAE (A)  Final   Report Status 11/07/2017 FINAL  Final   Organism ID, Bacteria ENTEROCOCCUS HIRAE  Final      Susceptibility   Enterococcus hirae - MIC*    AMPICILLIN 16 RESISTANT Resistant     VANCOMYCIN <=0.5 SENSITIVE Sensitive     GENTAMICIN SYNERGY SENSITIVE Sensitive     * ENTEROCOCCUS HIRAE  Blood Culture ID Panel (Reflexed)     Status: Abnormal   Collection Time: 11/04/17  1:01 PM  Result Value Ref Range Status   Enterococcus species DETECTED (A) NOT DETECTED Final    Comment: CRITICAL RESULT CALLED TO, READ BACK BY AND VERIFIED WITH:  DAVID BESANTI AT 0723 11/05/17 SDR    Vancomycin resistance NOT DETECTED NOT DETECTED Final   Listeria monocytogenes NOT DETECTED NOT DETECTED Final   Staphylococcus species NOT DETECTED NOT DETECTED Final   Staphylococcus aureus NOT DETECTED NOT DETECTED Final   Streptococcus species NOT DETECTED NOT DETECTED Final   Streptococcus agalactiae NOT DETECTED  NOT DETECTED Final   Streptococcus pneumoniae NOT DETECTED NOT DETECTED Final   Streptococcus pyogenes NOT DETECTED NOT DETECTED Final   Acinetobacter baumannii NOT DETECTED NOT DETECTED Final   Enterobacteriaceae species NOT DETECTED NOT DETECTED Final   Enterobacter cloacae complex NOT DETECTED NOT DETECTED Final   Escherichia coli NOT DETECTED NOT DETECTED Final   Klebsiella oxytoca NOT DETECTED NOT DETECTED Final   Klebsiella pneumoniae NOT DETECTED NOT DETECTED Final   Proteus species NOT DETECTED NOT DETECTED Final   Serratia marcescens NOT DETECTED NOT DETECTED Final   Haemophilus influenzae NOT DETECTED NOT DETECTED Final   Neisseria meningitidis NOT DETECTED NOT DETECTED Final   Pseudomonas aeruginosa NOT DETECTED NOT DETECTED Final   Candida albicans NOT DETECTED NOT DETECTED Final   Candida glabrata NOT DETECTED NOT DETECTED Final   Candida krusei NOT DETECTED NOT DETECTED Final   Candida parapsilosis NOT DETECTED NOT DETECTED Final   Candida tropicalis NOT DETECTED NOT DETECTED Final    Comment: Performed at [facility], [address] [place], [place] [address]    RADIOLOGY:  Ct Abdomen Pelvis W Contrast  Result Date: 11/04/2017 CLINICAL DATA:  72 year old female with metastatic breast cancer with abdominal and small bowel involvement. Post Roux-en-Y for bowel obstruction. Now presenting with fever and form stomach. Abdominal pain with vomiting. Prior hysterectomy.  Subsequent encounter. EXAM: CT ABDOMEN AND PELVIS WITH CONTRAST TECHNIQUE: Multidetector CT imaging of the abdomen and pelvis was performed using the standard protocol following bolus administration of intravenous contrast. CONTRAST:  75 cc Isovue 300. COMPARISON:  10/11/2017 CT. FINDINGS: Lower chest: No worrisome abnormality. Central line tip within the right atrium. Hepatobiliary: Intra and extrahepatic biliary duct dilation. No calcified common bile duct stone or obvious pancreatic mass seen as cause  of obstruction. Correlation with liver enzymes recommended. If further delineation were clinically desired MRCP or ERCP could be performed. Slightly heterogeneous liver without worrisome mass. Pancreas: No focal pancreatic mass. Spleen: No splenic mass or enlargement. Adrenals/Urinary Tract: Persistent mild right-sided hydronephrosis may be caused by metastatic involvement of the right posterolateral pelvis narrowing the right ureter. Minimal fullness left renal collecting system. No mild prominence left adrenal gland without significant nodularity. Right adrenal gland unremarkable. Partially decompressed urinary bladder with multiple nodules suggests of metastatic involvement of the bladder wall. Stomach/Bowel: Post Roux-en-Y. 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 Metastatic involvement of the small bowel near the Roux-en-Y with proximal obstruction. Metastatic involvement of the ileum. Stomach, small bowel and colonic wall thickening and ascites without free intraperitoneal air. This may reflect underlying enterocolitis given patient's fever. Vascular/Lymphatic: Prominent atherosclerotic changes aorta and aortic branch vessels. No aortic aneurysm or large vessel occlusion. Prominent narrowing celiac artery origin and common iliac artery origin bilaterally. Scattered small to slightly enlarged lymph nodes most notable left periaortic region. Reproductive: Post hysterectomy. Other: 4 cm ill-defined area posterior right pelvis is previously noted suspicious for metastatic involvement. Third spacing of fluid. Peritoneal implants suspected. No free intraperitoneal air or bowel containing hernia. Musculoskeletal: Degenerative changes lumbar spine with Schmorl's node deformity superior endplate L3. No osseous destructive lesion. IMPRESSION: Post Roux-en-Y. 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 Metastatic involvement of the small bowel near the Roux-en-Y with proximal obstruction. Metastatic involvement of the ileum Stomach, small bowel and colonic wall thickening and ascites may reflect underlying enterocolitis given patient's fever. Persistent mild right-sided hydronephrosis may be caused by metastatic disease of the right posterolateral pelvis (spanning over 4 cm) and subsequent narrowing the right ureter. Partially decompressed urinary bladder with multiple nodules suggests of metastatic involvement of the bladder wall. Third spacing of fluid with peritoneal implants suspected. Intra and extrahepatic biliary duct dilation. No calcified common bile duct stone or obvious pancreatic mass seen as cause of obstruction. Correlation with liver enzymes recommended. If further delineation were clinically desired MRCP or ERCP could be performed. Central line tip within the right atrium. Aortic Atherosclerosis (ICD10-I70.0). Electronically Signed   By: Genia Del M.D.   On: 11/04/2017 14:25    EKG:   Orders placed or performed during the hospital encounter of 10/11/17  . ED EKG 12-Lead  . ED EKG 12-Lead      Management plans discussed with the patient, family and they are in agreement.  CODE STATUS:     Code Status Orders  (From admission, onward)        Start     Ordered   11/05/17 1421  Do not attempt resuscitation (DNR)  Continuous    Question Answer Comment  In the event of cardiac or respiratory ARREST Do not call a "code blue"   In the event of cardiac or respiratory ARREST Do not perform Intubation, CPR, defibrillation or ACLS   In the event of  cardiac or respiratory ARREST Use medication by any route, position, wound care, and other measures to relive pain and suffering. May use oxygen, suction and manual treatment of airway obstruction as needed for comfort.      11/05/17 1421    Code Status History    Date Active Date Inactive Code Status Order ID  Comments User Context   11/04/2017 20:59 11/05/2017 14:21 Full Code [id number]  Dustin Flock, MD Inpatient   10/11/2017 21:03 10/17/2017 19:32 Full Code [id number]  Henreitta Leber, MD Inpatient   09/21/2017 06:33 09/23/2017 17:17 Full Code [id number]  Harrie Foreman, MD Inpatient   08/08/2017 01:34 08/10/2017 00:29 Full Code [id number]  Lance Coon, MD Inpatient   06/09/2017 03:31 06/17/2017 17:15 Full Code [id number]  Harvie Bridge, DO ED   03/04/2017 01:23 03/13/2017 20:56 Full Code [id number]  Harvie Bridge, DO Inpatient   02/24/2017 23:40 02/26/2017 17:06 Full Code [id number]  Lance Coon, MD Inpatient      TOTAL TIME TAKING CARE OF THIS PATIENT: 45  minutes.   Note: This dictation was prepared with Dragon dictation along with smaller phrase technology. Any transcriptional errors that result from this process are unintentional.   @[facility] @  on 11/07/2017 at 12:45 PM  Between 7am to 6pm - Pager - [phone]  After 6pm go to [email or web address] - password [email or web address] [facility]  [facility]  Office  [phone]  CC: Primary care physician; Donnie Coffin, MD

## 2017-11-09 LAB — CULTURE, BLOOD (ROUTINE X 2)
Culture: NO GROWTH
Special Requests: ADEQUATE

## 2017-11-11 ENCOUNTER — Inpatient Hospital Stay: Payer: Medicare HMO | Admitting: Internal Medicine

## 2017-11-11 ENCOUNTER — Inpatient Hospital Stay: Payer: Medicare HMO

## 2018-01-25 DEATH — deceased

## 2018-09-13 IMAGING — DX DG ABD PORTABLE 1V
1 series · 1 of 1 positions shown · non-contrast
Comparison: None

CLINICAL DATA: Evaluate NG tube placement.

EXAM:
PORTABLE ABDOMEN - 1 VIEW

[abdomen kub]
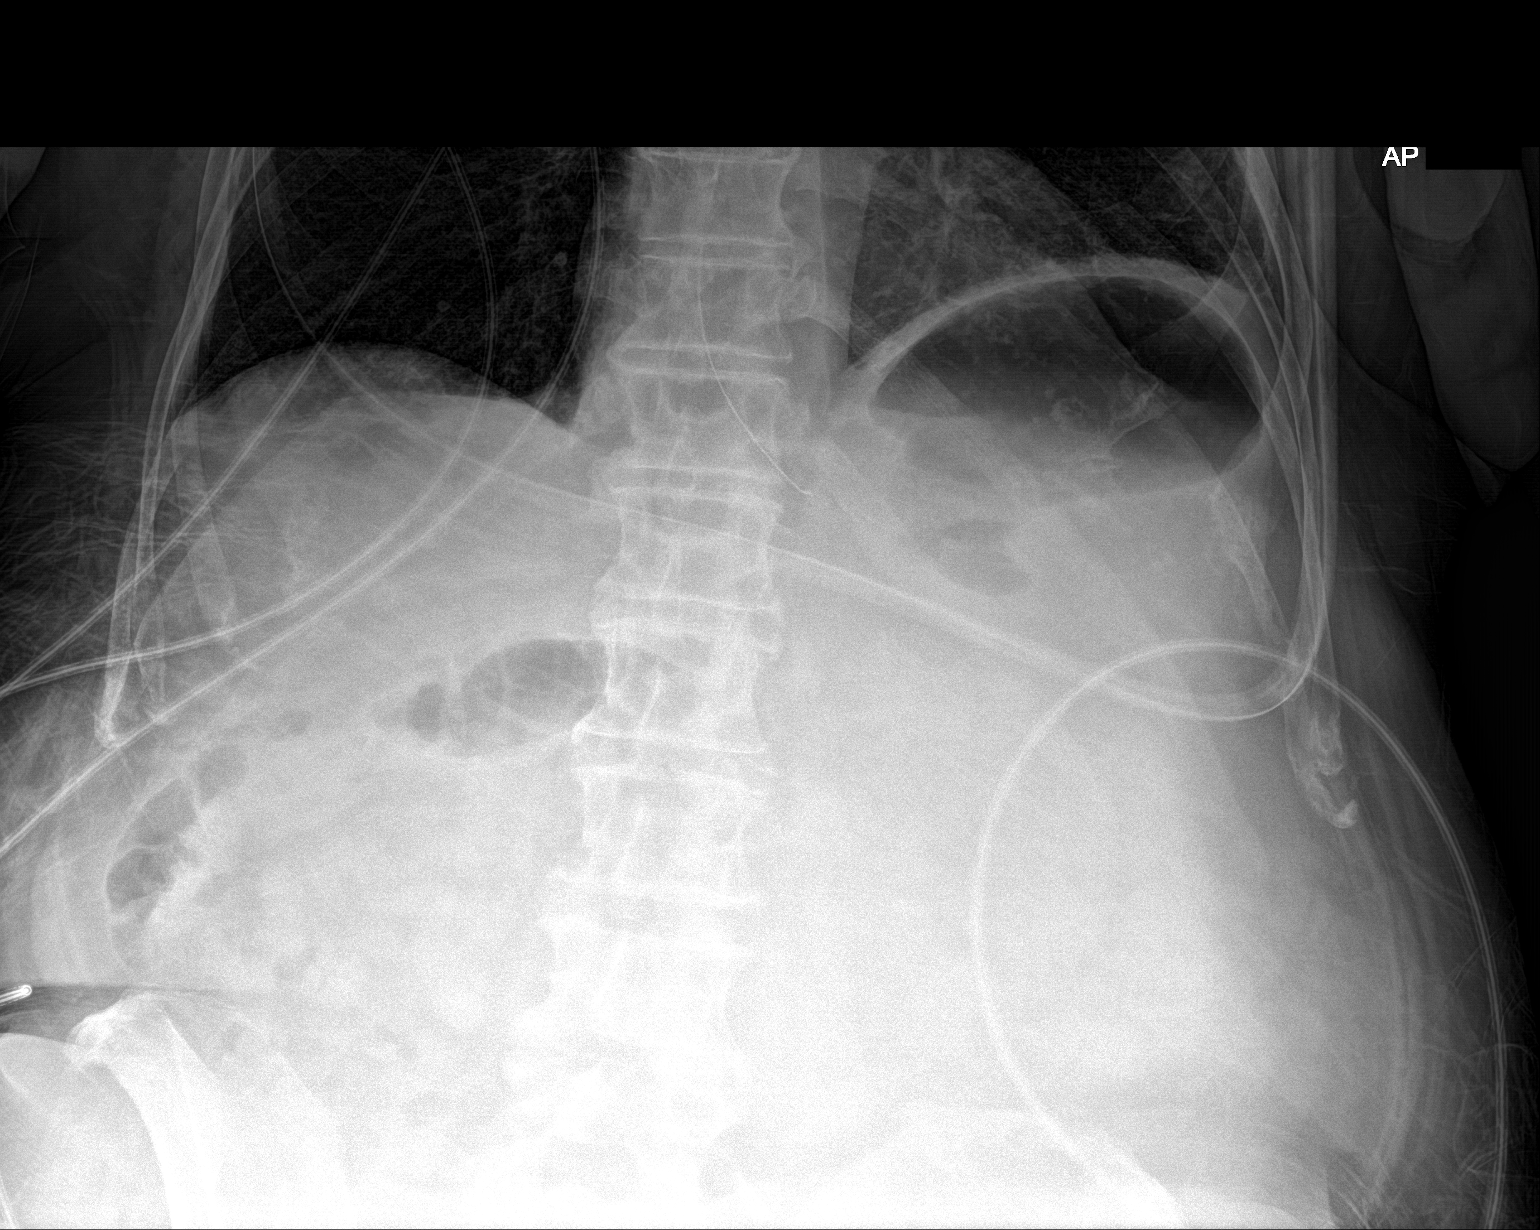

[1 of 1 positions shown; findings below may reference images not displayed]

FINDINGS: The side port of the NG tube is 7 cm above the GE junction with the
tip at the GE junction. No other abnormalities.
IMPRESSION: The side-port of the NG tube is 7 cm above the GE junction with the
tip at the GE junction. Recommend advancement.

## 2018-09-13 IMAGING — DX DG CHEST 1V PORT
1 series · 1 of 1 positions shown · non-contrast
Comparison: Prior CT from 11/02/2016.

CLINICAL DATA: Initial evaluation for central line placement.

EXAM:
PORTABLE CHEST 1 VIEW

[chest ap]
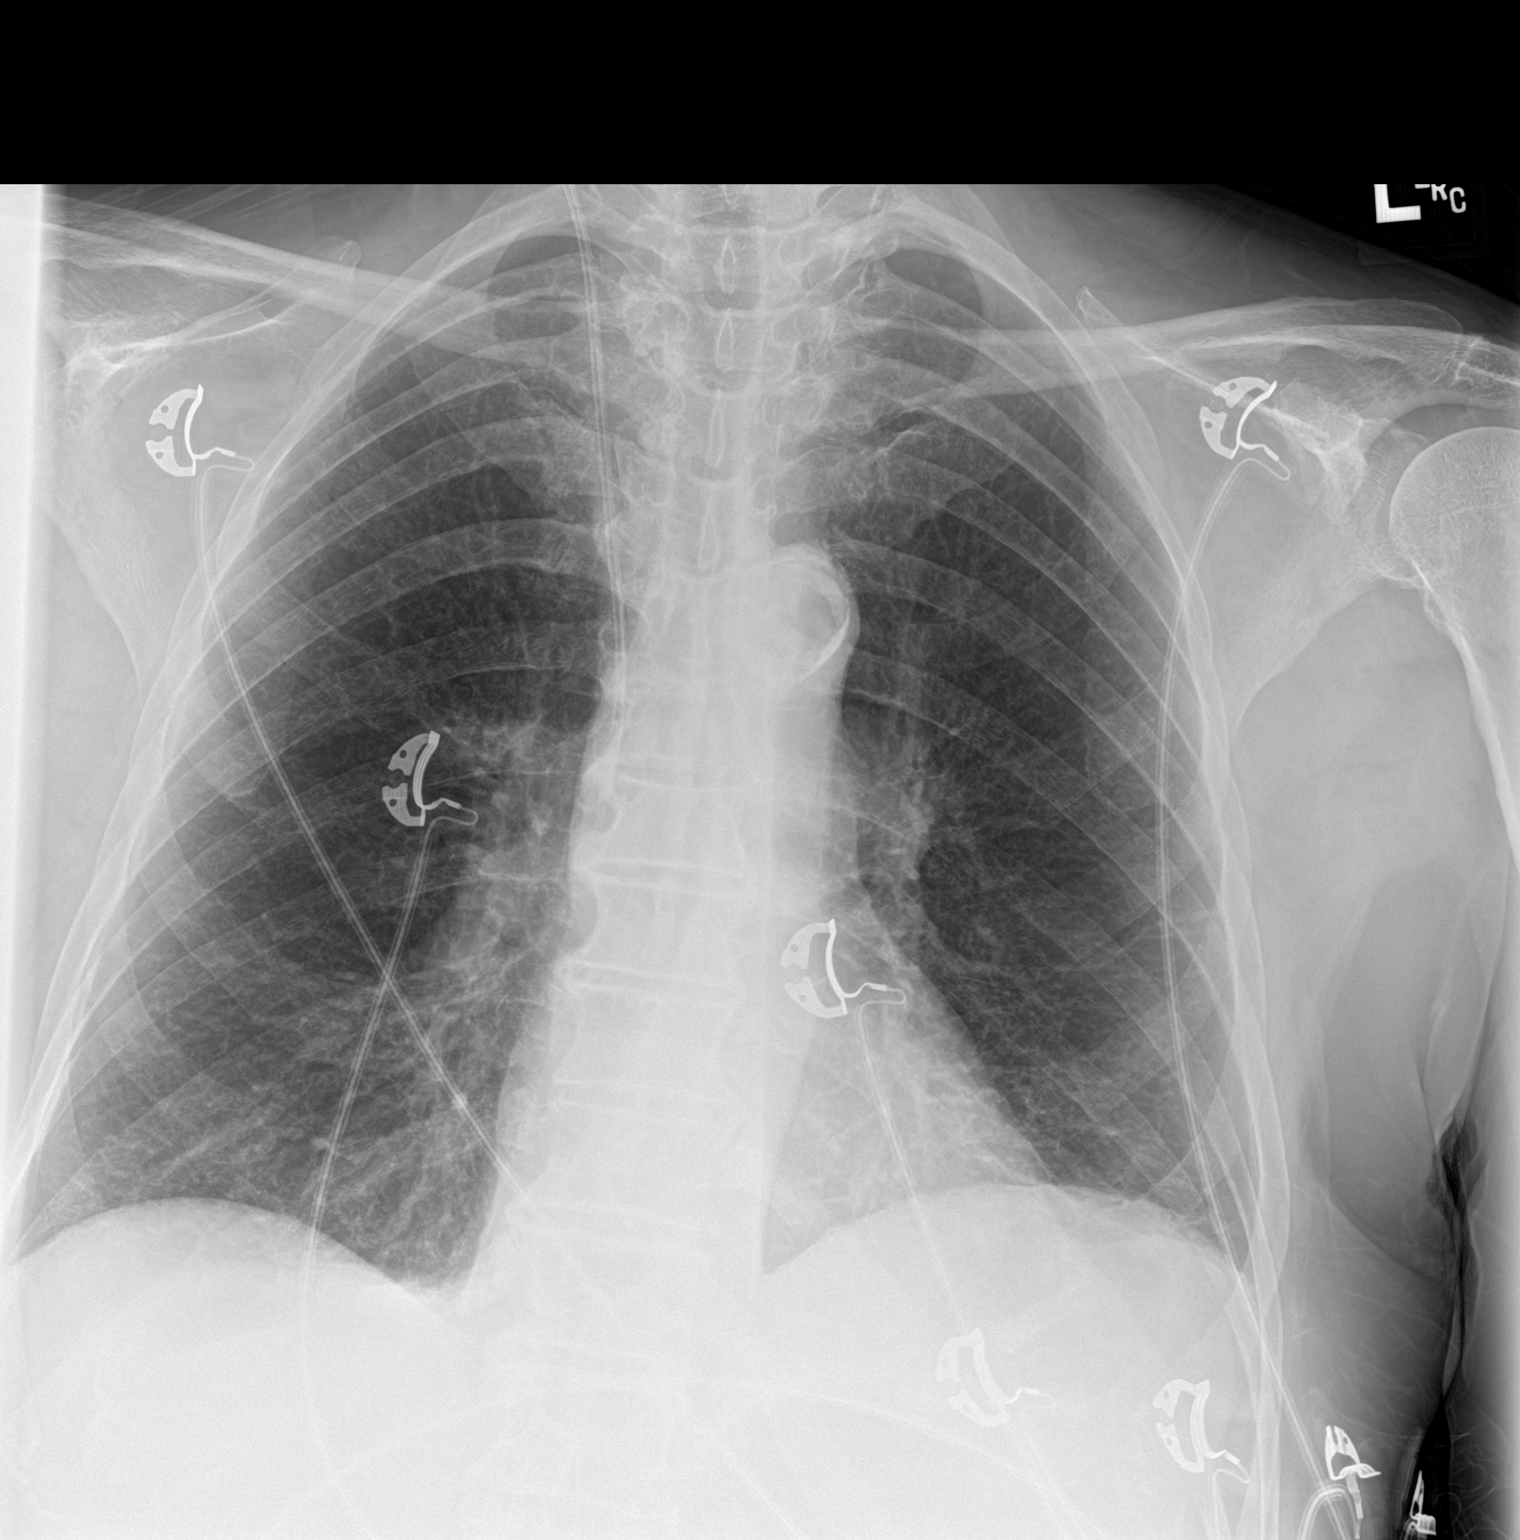

[1 of 1 positions shown; findings below may reference images not displayed]

FINDINGS: Right IJ approach centra venous catheter in place with tip overlying
the distal SVC. Cardiac mediastinal silhouettes are within normal
limits. Aortic atherosclerosis.

Lungs mildly hypoinflated. Mild subsegmental atelectasis at the
bilateral lung bases. No focal infiltrates. No pulmonary edema or
pleural effusion. No pneumothorax.

No acute osseus abnormality.
IMPRESSION: 1. Right IJ approach centra venous catheter in place with tip
overlying the distal SVC.
2. Shallow lung inflation with mild bibasilar subsegmental
atelectasis.
3. No other active cardiopulmonary disease.
4. Aortic atherosclerosis.

## 2018-09-13 IMAGING — CT CT CHEST W/O CM
2 of 4 series · 12 of 36 positions shown, 15 images · non-contrast
Comparison: Prior CT from 11/02/2016.

CLINICAL DATA: Initial evaluation for acute generalized lower/ mid
abdominal pain with nausea and vomiting. Concern for obstruction.

EXAM:
CT CHEST, ABDOMEN AND PELVIS WITHOUT CONTRAST
TECHNIQUE: Multidetector CT imaging of the chest, abdomen and pelvis was
performed following the standard protocol without IV contrast.

[Series 2: cap wo st · axial · 0.83mm/px · z∈[-1174,-674]mm · 9 of 124 slices shown, 12 images]
[im 12/124  mediastinal]
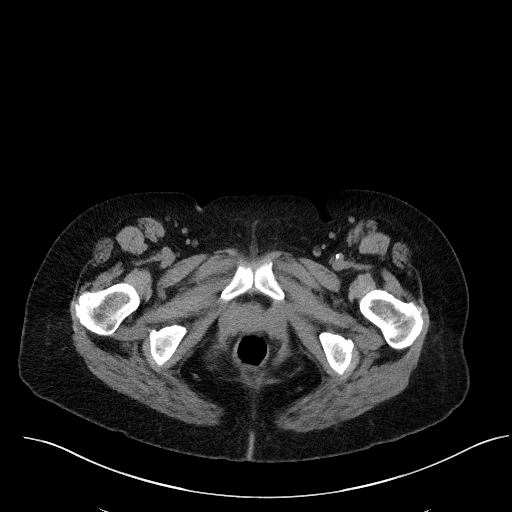
[im 12/124  lung]
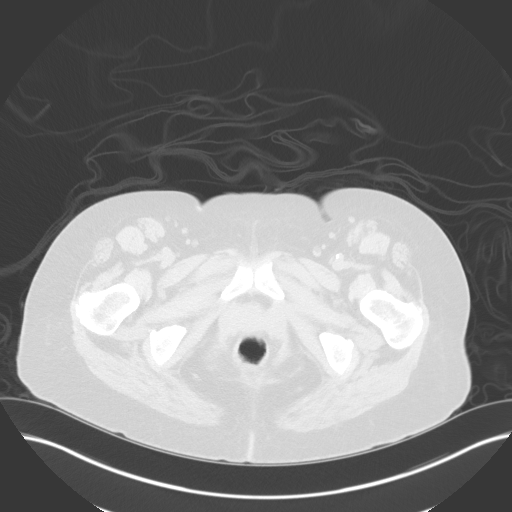
[im 23/124  lung]
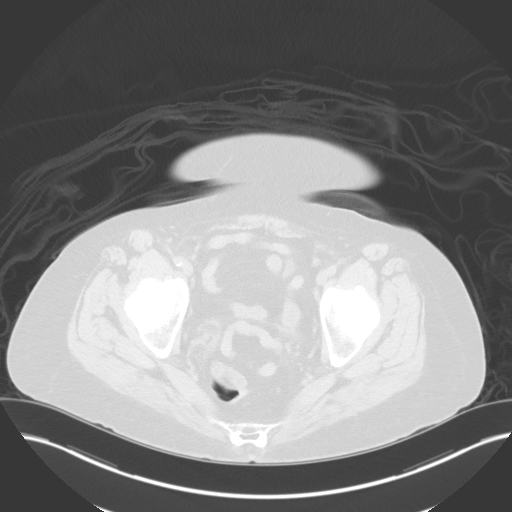
[im 34/124  lung]
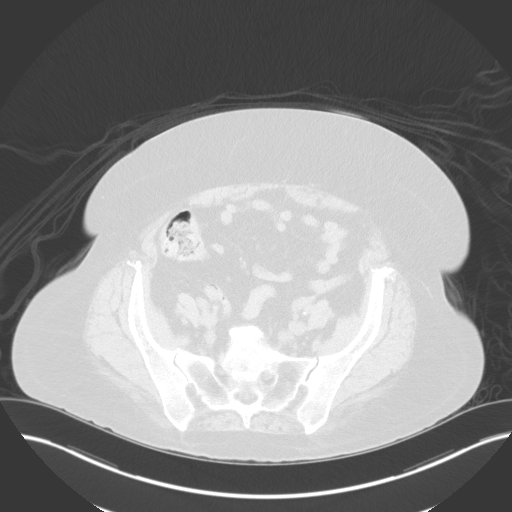
[im 45/124  lung]
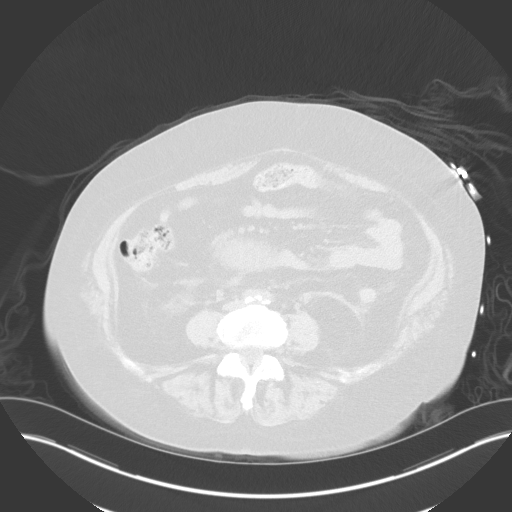
[im 68/124  mediastinal]
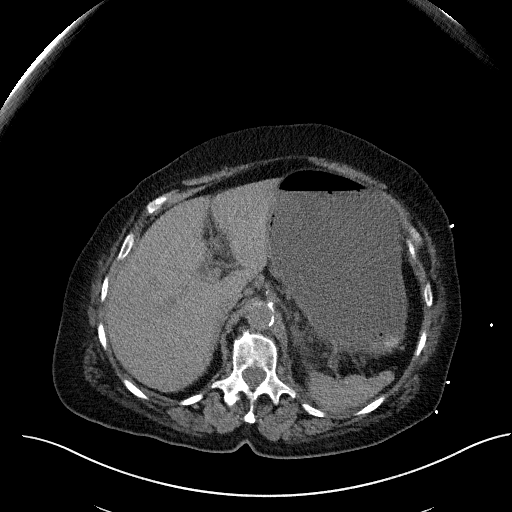
[im 68/124  lung]
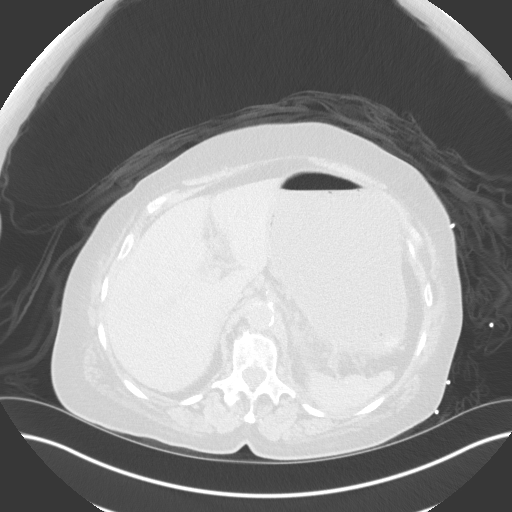
[im 79/124  lung]
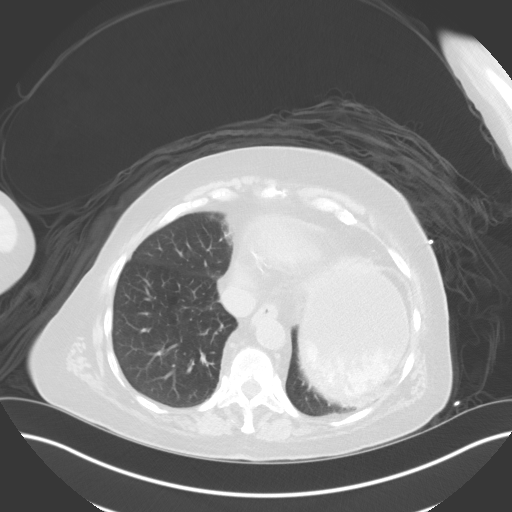
[im 90/124  lung]
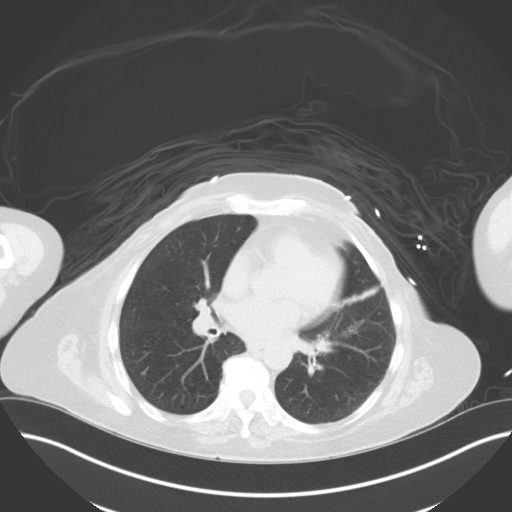
[im 101/124  lung]
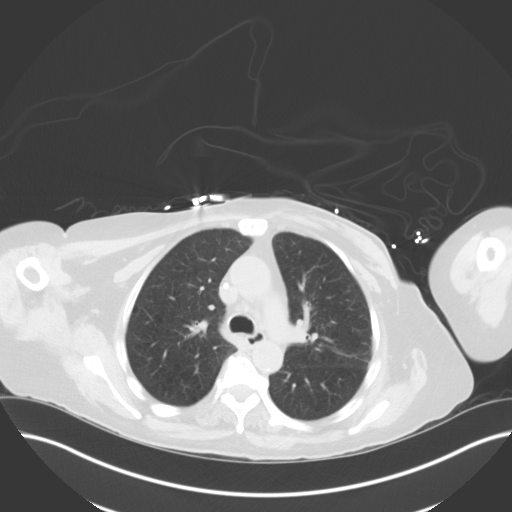
[im 112/124  mediastinal]
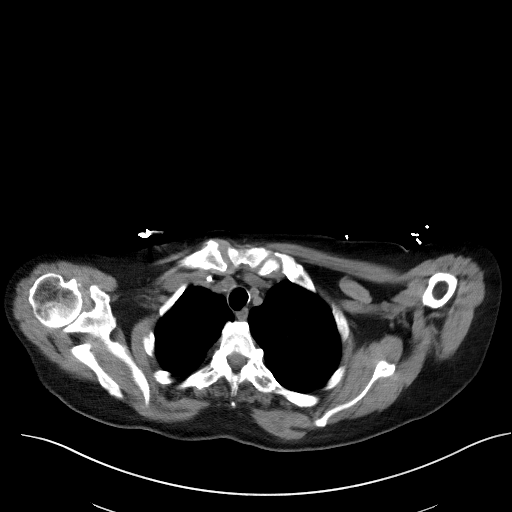
[im 112/124  lung]
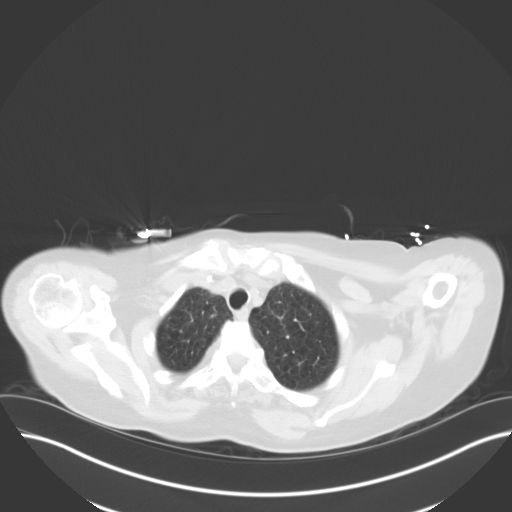

[Series 5: coronal · coronal · 0.73mm/px · 3 of 137 slices shown]
[im 28/137  lung]
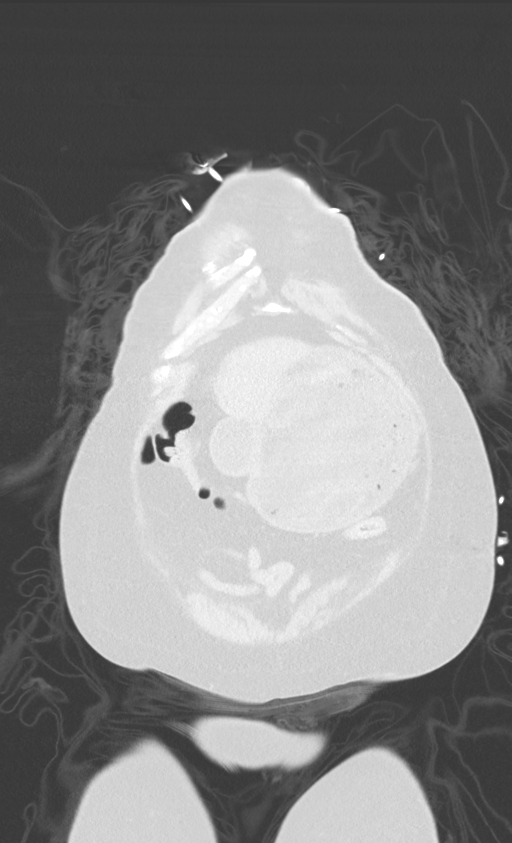
[im 55/137  lung]
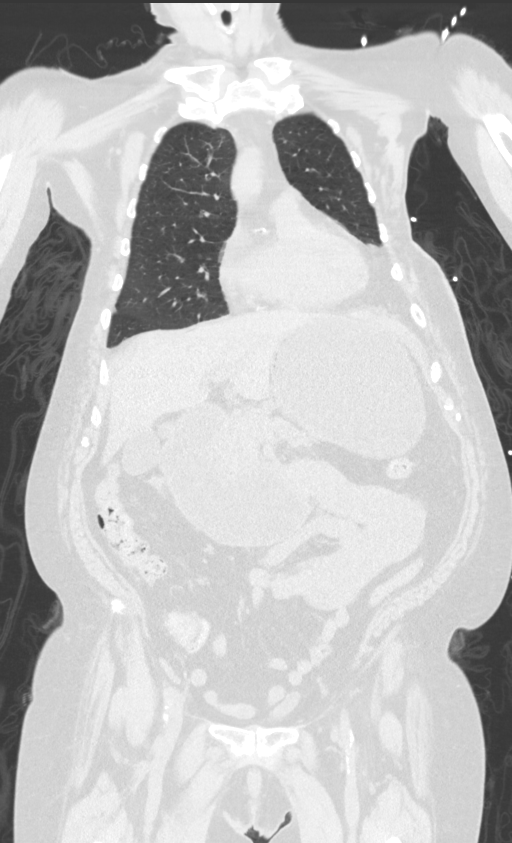
[im 82/137  lung]
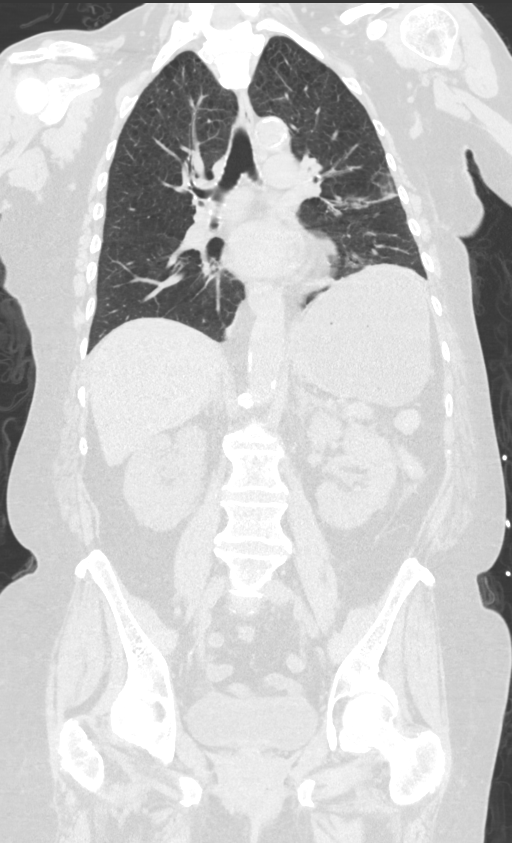

[12 of 36 positions shown; findings below may reference images not displayed]

FINDINGS: CT CHEST FINDINGS

Cardiovascular: Intrathoracic aorta of normal caliber without
aneurysm. Moderate aortic atherosclerosis. Partially visualized
great vessels grossly within normal limits. Heart size normal. Three
vessel coronary artery calcifications. No pericardial effusion.

Mediastinum/Nodes: Thyroid normal. No pathologically enlarged
mediastinal, hilar, or axillary lymph nodes identified. Esophagus
within normal limits.

Lungs/Pleura: Tracheobronchial tree is patent. Upper lobe
predominant emphysema. Linear atelectasis present within the lingula
and left lower lobe. Lungs are otherwise clear. No focal
infiltrates. No pulmonary edema or pleural effusion. No
pneumothorax. No worrisome pulmonary nodule or mass.

Musculoskeletal: No acute osseus abnormality. No worrisome lytic or
blastic osseous lesions.

CT ABDOMEN PELVIS FINDINGS

Hepatobiliary: Limited noncontrast evaluation of the liver is
unremarkable. Punctate calcification within the gallbladder may
reflect a small stone versus mural calcification. No evidence for
acute cholecystitis. No biliary dilatation.

Pancreas: Pancreas within normal limits.

Spleen: Spleen within normal limits.

Adrenals/Urinary Tract: Adrenal glands within normal limits. Kidneys
equal in size without evidence for nephrolithiasis or
hydronephrosis. No hydroureter. Bladder partially distended and
within normal limits.

Stomach/Bowel: Prominent gastric distension. Additionally, there is
mild distention of the duodenum to the level of the ligament of
Treitz. No obvious obstructive mass identified, although an
underlying obstructive lesion could conceivably be present.
Distally, small bowel is decompressed and of normal caliber.
Appendix normal. Colon is diffusely decompressed. No other acute
inflammatory changes seen about the bowels.

Vascular/Lymphatic: Moderate aorto bi-iliac atherosclerotic disease.
No aneurysm. No appreciable adenopathy on this noncontrast
examination.

Reproductive: Uterus is not absent. Ovaries not discretely
identified.

Other: No free air or fluid.

Musculoskeletal: No acute osseous abnormality. No worrisome lytic or
blastic osseous lesions.
IMPRESSION: 1. Prominent distension of the stomach and duodenum to the level of
the ligament of Treitz, with decompression of the small bowel
distally. While this finding may be transient and physiologic in
nature, a possible proximal obstructive process could be considered
in the correct clinical setting. No obvious mass or other
abnormality identified on this noncontrast examination. Correlation
with symptomatology recommended. Additionally, GI consultation with
possible further evaluation with upper endoscopy could be considered
as clinically warranted.
2. No other acute abnormality within the chest, abdomen, and pelvis.
3. Subcentimeter calcification within the gallbladder, which may
reflect a small stone or possibly mural calcification.
4. Emphysema.
5. Moderate atherosclerosis with diffuse 3 vessel coronary artery
calcifications.
6.
